# Patient Record
Sex: Female | Born: 1970
Health system: Southern US, Community
[De-identification: ages and names within clinical notes are randomized; demographics above are authoritative.]

## PROBLEM LIST (undated history)

## (undated) DIAGNOSIS — M1990 Unspecified osteoarthritis, unspecified site: Secondary | ICD-10-CM

## (undated) DIAGNOSIS — G35 Multiple sclerosis: Secondary | ICD-10-CM

## (undated) HISTORY — PX: NO PAST SURGERIES: SHX2092

---

## 2001-10-09 ENCOUNTER — Encounter: Admission: RE | Admit: 2001-10-09 | Discharge: 2001-10-09 | Payer: Self-pay | Admitting: Family Medicine

## 2001-10-09 ENCOUNTER — Encounter: Payer: Self-pay | Admitting: Family Medicine

## 2003-05-12 ENCOUNTER — Emergency Department (HOSPITAL_COMMUNITY): Admission: EM | Admit: 2003-05-12 | Discharge: 2003-05-12 | Payer: Self-pay | Admitting: Emergency Medicine

## 2004-05-23 ENCOUNTER — Encounter (HOSPITAL_COMMUNITY): Admission: RE | Admit: 2004-05-23 | Discharge: 2004-05-25 | Payer: Self-pay | Admitting: Neurology

## 2004-05-30 ENCOUNTER — Emergency Department (HOSPITAL_COMMUNITY): Admission: EM | Admit: 2004-05-30 | Discharge: 2004-05-30 | Payer: Self-pay | Admitting: Emergency Medicine

## 2006-06-11 ENCOUNTER — Ambulatory Visit: Payer: Self-pay

## 2006-11-14 ENCOUNTER — Encounter: Admission: RE | Admit: 2006-11-14 | Discharge: 2007-02-12 | Payer: Self-pay | Admitting: Neurology

## 2009-01-08 ENCOUNTER — Encounter: Admission: RE | Admit: 2009-01-08 | Discharge: 2009-01-08 | Payer: Self-pay | Admitting: Neurology

## 2010-04-01 ENCOUNTER — Encounter: Payer: Self-pay | Admitting: Internal Medicine

## 2010-07-27 NOTE — Op Note (Signed)
NAMESELAM, PIETSCH NO.:  [id number]   MEDICAL RECORD NO.:  [id number]            PATIENT TYPE:   LOCATION:                                 FACILITY:   PHYSICIAN:  Genene Churn. Love, M.D.         DATE OF BIRTH:   DATE OF PROCEDURE:  05/23/2004  DATE OF DISCHARGE:                                 OPERATIVE REPORT   DESCRIPTION OF PROCEDURE:  The patient was prepped and draped in the left  lateral decubitus position using Betadine and 1% Xylocaine.  The L4-5  interspace was entered without difficulty.  The opening pressure was 120  mmH2O and 2.5 mL of clear-colored CSF were obtained.  In putting on the  tubing, the CSF turned bloody and the procedure was discontinued.  The 2.5  mL of spinal fluid were sent for cell count, protein, IgG and oligoclonal  IgG.  The patient tolerated the procedure well.                                        ___________________________________________  Genene Churn. Sandria Manly, M.D.    JML/MEDQ  D:  05/23/2004  T:  05/23/2004  Job:  [id number]

## 2010-09-06 ENCOUNTER — Emergency Department (HOSPITAL_BASED_OUTPATIENT_CLINIC_OR_DEPARTMENT_OTHER)
Admission: EM | Admit: 2010-09-06 | Discharge: 2010-09-06 | Disposition: A | Discharge status: Home / Self Care | Payer: Medicare PPO | Source: Home / Self Care | Attending: Emergency Medicine | Admitting: Emergency Medicine

## 2010-09-06 ENCOUNTER — Emergency Department (HOSPITAL_COMMUNITY): Payer: Medicare PPO

## 2010-09-06 ENCOUNTER — Emergency Department (INDEPENDENT_AMBULATORY_CARE_PROVIDER_SITE_OTHER): Payer: Medicare PPO

## 2010-09-06 ENCOUNTER — Observation Stay (HOSPITAL_COMMUNITY)
Admission: EM | Admit: 2010-09-06 | Discharge: 2010-09-11 | Disposition: A | Discharge status: Skilled Nursing Facility | Payer: Medicare PPO | Attending: Internal Medicine | Admitting: Internal Medicine

## 2010-09-06 DIAGNOSIS — Z79899 Other long term (current) drug therapy: Secondary | ICD-10-CM | POA: Insufficient documentation

## 2010-09-06 DIAGNOSIS — M5430 Sciatica, unspecified side: Secondary | ICD-10-CM | POA: Insufficient documentation

## 2010-09-06 DIAGNOSIS — W19XXXA Unspecified fall, initial encounter: Secondary | ICD-10-CM | POA: Insufficient documentation

## 2010-09-06 DIAGNOSIS — G35 Multiple sclerosis: Secondary | ICD-10-CM | POA: Insufficient documentation

## 2010-09-06 DIAGNOSIS — Y92009 Unspecified place in unspecified non-institutional (private) residence as the place of occurrence of the external cause: Secondary | ICD-10-CM | POA: Insufficient documentation

## 2010-09-06 DIAGNOSIS — S8253XA Displaced fracture of medial malleolus of unspecified tibia, initial encounter for closed fracture: Principal | ICD-10-CM | POA: Insufficient documentation

## 2010-09-06 DIAGNOSIS — Z01812 Encounter for preprocedural laboratory examination: Secondary | ICD-10-CM | POA: Insufficient documentation

## 2010-09-06 DIAGNOSIS — K5909 Other constipation: Secondary | ICD-10-CM | POA: Insufficient documentation

## 2010-09-06 DIAGNOSIS — E876 Hypokalemia: Secondary | ICD-10-CM | POA: Insufficient documentation

## 2010-09-06 DIAGNOSIS — W1809XA Striking against other object with subsequent fall, initial encounter: Secondary | ICD-10-CM

## 2010-09-06 DIAGNOSIS — M25579 Pain in unspecified ankle and joints of unspecified foot: Secondary | ICD-10-CM

## 2010-09-06 DIAGNOSIS — T4275XA Adverse effect of unspecified antiepileptic and sedative-hypnotic drugs, initial encounter: Secondary | ICD-10-CM | POA: Insufficient documentation

## 2010-09-06 DIAGNOSIS — E669 Obesity, unspecified: Secondary | ICD-10-CM | POA: Insufficient documentation

## 2010-09-06 LAB — POCT I-STAT, CHEM 8
Calcium, Ion: 1.09 mmol/L — ABNORMAL LOW (ref 1.12–1.32)
Chloride: 104 mEq/L (ref 96–112)
Creatinine, Ser: 0.7 mg/dL (ref 0.50–1.10)
HCT: 49 % — ABNORMAL HIGH (ref 36.0–46.0)
TCO2: 25 mmol/L (ref 0–100)

## 2010-09-06 LAB — CBC
HCT: 42.2 % (ref 36.0–46.0)
MCH: 31.6 pg (ref 26.0–34.0)
RBC: 4.69 MIL/uL (ref 3.87–5.11)

## 2010-09-06 LAB — DIFFERENTIAL
Basophils Absolute: 0 10*3/uL (ref 0.0–0.1)
Basophils Relative: 0 % (ref 0–1)
Lymphs Abs: 3.4 10*3/uL (ref 0.7–4.0)
Monocytes Absolute: 1 10*3/uL (ref 0.1–1.0)
Monocytes Relative: 9 % (ref 3–12)

## 2010-09-08 LAB — CBC
Hemoglobin: 14.3 g/dL (ref 12.0–15.0)
Platelets: 293 10*3/uL (ref 150–400)
RBC: 4.46 MIL/uL (ref 3.87–5.11)

## 2010-09-08 LAB — DIFFERENTIAL
Basophils Absolute: 0 10*3/uL (ref 0.0–0.1)
Basophils Relative: 0 % (ref 0–1)
Eosinophils Absolute: 0.1 10*3/uL (ref 0.0–0.7)
Eosinophils Relative: 1 % (ref 0–5)
Lymphs Abs: 1.8 10*3/uL (ref 0.7–4.0)
Monocytes Absolute: 0.5 10*3/uL (ref 0.1–1.0)
Monocytes Relative: 6 % (ref 3–12)
Neutro Abs: 5.6 10*3/uL (ref 1.7–7.7)

## 2010-09-08 LAB — COMPREHENSIVE METABOLIC PANEL
Alkaline Phosphatase: 92 U/L (ref 39–117)
BUN: 11 mg/dL (ref 6–23)
CO2: 27 mEq/L (ref 19–32)
Calcium: 8.9 mg/dL (ref 8.4–10.5)
Creatinine, Ser: 0.75 mg/dL (ref 0.50–1.10)
GFR calc Af Amer: >60 mL/min (ref >60)
Glucose, Bld: 112 mg/dL — ABNORMAL HIGH (ref 70–99)
Sodium: 138 mEq/L (ref 135–145)
Total Bilirubin: 0.3 mg/dL (ref 0.3–1.2)

## 2010-09-09 LAB — BASIC METABOLIC PANEL
BUN: 9 mg/dL (ref 6–23)
CO2: 28 mEq/L (ref 19–32)
Chloride: 102 mEq/L (ref 96–112)
Creatinine, Ser: 0.66 mg/dL (ref 0.50–1.10)
GFR calc non Af Amer: >60 mL/min (ref >60)
Glucose, Bld: 111 mg/dL — ABNORMAL HIGH (ref 70–99)
Sodium: 138 mEq/L (ref 135–145)

## 2010-09-09 LAB — CBC
Hemoglobin: 13.9 g/dL (ref 12.0–15.0)
MCH: 31.2 pg (ref 26.0–34.0)
Platelets: 278 10*3/uL (ref 150–400)
RDW: 13.2 % (ref 11.5–15.5)
WBC: 6.6 10*3/uL (ref 4.0–10.5)

## 2010-09-09 NOTE — Discharge Summary (Signed)
NAMEAUSTIN, [name] NO.:  [id number]  MEDICAL RECORD NO.:  [id number]  LOCATION:  5127                         FACILITY:  [facility]  PHYSICIAN:  Talmage Nap, MD  DATE OF BIRTH:  February 18, 1971  DATE OF ADMISSION:  09/06/2010 DATE OF DISCHARGE:                        DISCHARGE SUMMARY - REFERRING   DATE OF DISCHARGE:  Undetermined.  PRIMARY CARE PHYSICIAN:  Unassigned.  CONSULTANT:  Orthopedic surgeon, Dr. Magnus Ivan.  DISCHARGE DIAGNOSES: 1. Fall/fracture of the medial malleolus of the right ankle, status     post splint - nonsurgical management.  The patient to have rehab. 2. Multiple sclerosis. 3. Chronic constipation, secondary to chronic narcotic use. 4. Hypokalemia. 5. Obesity.  HISTORY OF PRESENT ILLNESS:  The patient is a 40 year old Caucasian female with history of multiple sclerosis who was admitted to the hospital on August 29, 2010 by Dr. Letha Cape with history of fall and subsequent inability to ambulate.  There was no premonitory symptoms prior to the onset of fall and no systemic symptoms.  Post fall, the patient was said to having extreme difficulty in ambulating and subsequently brought to the emergency room to be evaluated.  PREADMISSION MEDICATIONS:  Avonex 30 mcg IM weekly.  ALLERGIES:  No known allergies.  PAST SURGICAL HISTORY:  No known surgeries.  SOCIAL HISTORY:  Negative for alcohol or tobacco use.  Lives alone by herself.  FAMILY HISTORY:  York Spaniel to be noncontributory.  REVIEW OF SYSTEMS:  Essentially as documented in the initial history and physical.  PHYSICAL EXAMINATION:  At the time, the patient was seen by the admitting physician. VITAL SIGNS:  Temperature of 98.6, blood pressure 139/90, heart rate 89, respiratory rate 20, saturating 99% on room. HEENT:  Pupils were reactive to light and extraocular muscles are intact. NECK:  No jugular venous distention.  No carotid bruit.  No lymphadenopathy. CHEST:  Clear to  auscultation. HEART:  Sounds are one and two. ABDOMEN:  Soft, nontender.  Liver, spleen, and kidney are not palpable. Bowel sounds are positive. EXTREMITIES:  Showed right lower extremity wrapped in an ACE bandage. NEUROPSYCHIATRIC:  Unremarkable. SKIN:  Showed normal turgor.  LABORATORY DATA:  Initial complete blood count with differential showed WBC of 10.7, hemoglobin of 14.8, hematocrit of 42.2, MCV of 90.0 with a platelet count of 315, normal differentials.  Chem-8 stat showed sodium of 140, potassium of 3.7, chloride of 104, glucose is 99, BUN is 11, creatinine 0.70.  A repeat complete blood count with differential done on August 22, 2010 showed WBC of 7.9, hemoglobin of 14.3, hematocrit of 41.2, MCV of 92.4 with a platelet count of 293.  A comprehensive metabolic panel showed sodium of 138, potassium of 3.3, chloride of 100 with a bicarb of 27, glucose is 112, BUN is 11, creatinine 0.75.  A repeat basic metabolic panel done on September 09, 2010 showed sodium of 138, potassium of 3.5, chloride of 102 with a bicarb of 28, glucose is 111, BUN is 19, creatinine 0.66 and a complete blood count with no differential showed WBC of 6.6, hemoglobin of 13.9, hematocrit of 41.0, MCV of 91.9 with a platelet count of 279.  Imaging studies done on the patient include x-ray  of the right ankle which showed fracture of the medial malleolus of the right ankle with diffuse soft tissue swelling.  HOSPITAL COURSE:  The patient was admitted to general medical floor. She was saline locked, given Zofran for nausea and Ambien 5 mg p.o. at bedtime for insomnia.  Her pain control was done with Tylenol and Percocet.  The patient was however seen by me for the very first time in this admission on September 07, 2010 and during this encounter, the patient continued to complain about pain in the right ankle.  At this point, the patient was continued on pain management and orthopedic surgeon, Dr. Magnus Ivan was consulted,  evaluated the patient, and recommended the patient to have PT/OT, and nonweightbearing splint on the right lower extremity with WBAT on the left leg.  Since the patient was found to be hypokalemic, she was given KCl 20 mEq p.o. b.i.d.  She was followed by me on a daily basis.  The patient was however seen today on 09/09/2010, complained about constipation and some irritation along the gluteal region since the patient wears a diaper because of her urinary incontinence.  Examination of the patient however was essentially unremarkable.  At this point, the patient was given Dulcolax 10 mg p.o. b.i.d. for constipation and zinc oxide powder apply b.i.d. to the gluteal region.  So far the patient has remained medically stable.  Plan is for the patient to be discharged to rehab, possibly [facility] on activity as tolerated and cardiac prudent diet.  Medication to be taken at the rehab facility will include: 1. Dulcolax 100 mg p.o. b.i.d. p.r.n. 2. Hydrocodone/APAP 5/325 1-2 tablets p.o. q.4 h. p.r.n. 3. MiraLax 15 g daily p.r.n. 4. Zinc oxide powder apply b.i.d. to the gluteal region. 5. Avonex 30 mcg IM weekly on Fridays.     Talmage Nap, MD     CN/MEDQ  D:  09/09/2010  T:  09/09/2010  Job:  [id number]  Electronically Signed by Talmage Nap  on 09/09/2010 05:09:17 PM

## 2010-09-10 LAB — COMPREHENSIVE METABOLIC PANEL
ALT: 18 U/L (ref 0–35)
Alkaline Phosphatase: 81 U/L (ref 39–117)
BUN: 10 mg/dL (ref 6–23)
Chloride: 101 mEq/L (ref 96–112)
Creatinine, Ser: 0.53 mg/dL (ref 0.50–1.10)
GFR calc non Af Amer: >60 mL/min (ref >60)
Sodium: 137 mEq/L (ref 135–145)
Total Bilirubin: 0.3 mg/dL (ref 0.3–1.2)

## 2010-09-10 LAB — CBC
HCT: 40.7 % (ref 36.0–46.0)
MCH: 30.5 pg (ref 26.0–34.0)
Platelets: 304 10*3/uL (ref 150–400)
RBC: 4.46 MIL/uL (ref 3.87–5.11)
WBC: 8.7 10*3/uL (ref 4.0–10.5)

## 2010-09-10 LAB — MAGNESIUM: Magnesium: 2 mg/dL (ref 1.5–2.5)

## 2010-09-10 LAB — DIFFERENTIAL
Eosinophils Absolute: 0.3 10*3/uL (ref 0.0–0.7)
Lymphs Abs: 3.7 10*3/uL (ref 0.7–4.0)
Monocytes Absolute: 0.9 10*3/uL (ref 0.1–1.0)
Neutro Abs: 3.7 10*3/uL (ref 1.7–7.7)
Neutrophils Relative %: 43 % (ref 43–77)

## 2010-09-10 NOTE — Discharge Summary (Signed)
  [name], [name] NO.:  [id number]  MEDICAL RECORD NO.:  [id number]  LOCATION:  5127                         FACILITY:  [facility]  PHYSICIAN:  Talmage Nap, MD  DATE OF BIRTH:  Oct 19, 1970  DATE OF ADMISSION:  09/06/2010 DATE OF DISCHARGE:  09/11/2010                        DISCHARGE SUMMARY - REFERRING   ADDENDUM: Please for discharge diagnosis and medications, see my initial discharge summary dictated on September 09, 2010.  The patient was however seen by me today which is September 10, 2010.  Denied any specific complaint.  Examination was essentially unremarkable.  Her vital signs, blood pressure is 126/70, pulse 86, respiratory rate 20, temperature is 98.8.  Labs prior to discharge include comprehensive metabolic panel which shows sodium of 137, potassium of 3.7, chloride 101 with a bicarb of 25, glucose is 96, BUN is 10, creatinine is 0.56.  LFT normal.  Magnesium 2.0.  Complete blood count with differential showed WBC of 8.7, hemoglobin 20.6, hematocrit of 40.7, MCV of 91.2 with a platelet count of 304.  The patient is medically stable.  Plan is for the patient to be discharged to rehab.  Refer to my initial discharge summary dictated on September 09, 2010 for discharge instructions and medications     Talmage Nap, MD     CN/MEDQ  D:  09/10/2010  T:  09/10/2010  Job:  [id number]  Electronically Signed by Talmage Nap  on 09/10/2010 03:16:54 PM

## 2010-09-11 LAB — URINALYSIS, DIPSTICK ONLY
Glucose, UA: NEGATIVE mg/dL
Protein, ur: NEGATIVE mg/dL
Specific Gravity, Urine: 1.021 (ref 1.005–1.030)
pH: 5.5 (ref 5.0–8.0)

## 2011-01-09 NOTE — H&P (Signed)
  NAMESABEEN, PIECHOCKI NO.:  [id number]  MEDICAL RECORD NO.:  [id number]  LOCATION:  [facility]                         FACILITY:  [facility]  PHYSICIAN:  Letha Cape, MD         DATE OF BIRTH:  [birth year]  DATE OF ADMISSION:  09/06/2010 DATE OF DISCHARGE:                             HISTORY & PHYSICAL   HISTORY OF PRESENT ILLNESS:  A 40 year old female who presents with a chief complaint of an ankle injury.  The patient was walking in her home and tripped on the demarcation between carpet and linoleum.  She fell and landed on her right ankle.  She was evaluated in the emergency room earlier this afternoon and was found to have a fracture of the medial malleolus of the right ankle.  The patient was treated in the emergency room at that time and discharged home.  Unfortunately, the patient lives alone and felt like she was unable to care for herself or manage at home.  She reports stumbling and landing on her foot several times after returning home.  She then returned to the emergency room, requesting assistance with placement in a rehab facility until she was recovered.  PAST MEDICAL HISTORY:  Multiple sclerosis.  MEDICATIONS:  Avonex 30 mcg injection weekly.  ALLERGIES:  No known drug allergies.  SURGICAL HISTORY:  No surgeries.  SOCIAL HISTORY:  She does not smoke or drink.  She lives at home alone.  FAMILY HISTORY:  Noncontributory.  REVIEW OF SYSTEMS:  All systems were reviewed and were negative except which was mentioned in the HPI.  PHYSICAL EXAMINATION:  VITAL SIGNS:  Temperature 98.6, blood pressure 139/90, heart rate 89, respirations 20, 99% on room air. GENERAL:  She is disheveled female, in no acute distress. CARDIOVASCULAR:  Regular rate and rhythm.  No murmurs, rubs, or gallops. LUNGS:  Clear to auscultation bilaterally. ABDOMEN:  Soft, nontender, nondistended.  Positive bowel sounds. EXTREMITIES:  Right lower extremity is wrapped in an Ace bandage.   Left lower extremity, no cyanosis, clubbing, or edema.  LABS:  White count 10.7, hemoglobin 16.7, hematocrit 49, platelets 315. Sodium 140, potassium 3.7, chloride 104, glucose 99, BUN 11, creatinine 0.7.  RADIOLOGY:  X-ray of the right ankle shows fracture of the medial malleolus with diffuse soft tissue swelling.  X-ray of left ankle shows no acute bony abnormality.  ASSESSMENT:  A 40 year old female with multiple sclerosis who presents with right ankle fracture and unable to care for herself at home.  1. Ankle fracture.  We will treat with pain medications as needed. 2. Multiple sclerosis.  Continue home Avonex weekly. 3. Prophylaxis.  Place on Lovenox for deep vein thrombosis prophylaxis     while hospitalized. 4. We will have social work to evaluate the patient for assistance and     placement in the rehab facility until she is able to care for     herself at home.          ______________________________ Letha Cape, MD     RW/MEDQ  D:  09/06/2010  T:  09/06/2010  Job:  [id number]  Electronically Signed by Virginia Rochester M.D. on 01/09/2011 10:20:04 AM

## 2012-08-25 ENCOUNTER — Encounter (HOSPITAL_COMMUNITY): Payer: Self-pay | Admitting: *Deleted

## 2012-08-25 ENCOUNTER — Observation Stay (HOSPITAL_COMMUNITY)
Admission: EM | Admit: 2012-08-25 | Discharge: 2012-08-26 | Disposition: A | Discharge status: Home / Self Care | Payer: Medicare PPO | Attending: Internal Medicine | Admitting: Internal Medicine

## 2012-08-25 ENCOUNTER — Emergency Department (HOSPITAL_COMMUNITY): Payer: Medicare PPO

## 2012-08-25 DIAGNOSIS — R0789 Other chest pain: Principal | ICD-10-CM | POA: Insufficient documentation

## 2012-08-25 DIAGNOSIS — Z602 Problems related to living alone: Secondary | ICD-10-CM | POA: Insufficient documentation

## 2012-08-25 DIAGNOSIS — F410 Panic disorder [episodic paroxysmal anxiety] without agoraphobia: Secondary | ICD-10-CM

## 2012-08-25 DIAGNOSIS — R079 Chest pain, unspecified: Secondary | ICD-10-CM

## 2012-08-25 DIAGNOSIS — G35 Multiple sclerosis: Secondary | ICD-10-CM

## 2012-08-25 HISTORY — DX: Multiple sclerosis: G35

## 2012-08-25 HISTORY — DX: Unspecified osteoarthritis, unspecified site: M19.90

## 2012-08-25 LAB — CBC WITH DIFFERENTIAL/PLATELET
Basophils Relative: 0 % (ref 0–1)
Eosinophils Absolute: 0.3 10*3/uL (ref 0.0–0.7)
Hemoglobin: 14.5 g/dL (ref 12.0–15.0)
Lymphocytes Relative: 40 % (ref 12–46)
MCH: 33.2 pg (ref 26.0–34.0)
MCHC: 35.5 g/dL (ref 30.0–36.0)
MCV: 93.6 fL (ref 78.0–100.0)
Monocytes Absolute: 0.7 10*3/uL (ref 0.1–1.0)
Monocytes Relative: 7 % (ref 3–12)
Neutro Abs: 4.5 10*3/uL (ref 1.7–7.7)
Neutrophils Relative %: 50 % (ref 43–77)
Platelets: 304 10*3/uL (ref 150–400)
RDW: 12.6 % (ref 11.5–15.5)

## 2012-08-25 LAB — BASIC METABOLIC PANEL
Calcium: 8.7 mg/dL (ref 8.4–10.5)
Creatinine, Ser: 0.8 mg/dL (ref 0.50–1.10)
GFR calc Af Amer: >90 mL/min (ref >90)
GFR calc non Af Amer: 89 mL/min — ABNORMAL LOW (ref >90)
Glucose, Bld: 105 mg/dL — ABNORMAL HIGH (ref 70–99)
Potassium: 3.5 mEq/L (ref 3.5–5.1)
Sodium: 139 mEq/L (ref 135–145)

## 2012-08-25 LAB — TROPONIN I
Troponin I: <0.3 ng/mL (ref <0.30)
Troponin I: <0.3 ng/mL (ref <0.30)
Troponin I: <0.3 ng/mL (ref <0.30)

## 2012-08-25 MED ORDER — ONDANSETRON HCL 4 MG/2ML IJ SOLN
4.0000 mg | Freq: Once | INTRAMUSCULAR | Status: AC
  Administered 2012-08-25: 4 mg via INTRAVENOUS
  Filled 2012-08-25: qty 2

## 2012-08-25 MED ORDER — SODIUM CHLORIDE 0.9 % IV SOLN
INTRAVENOUS | Status: AC
  Administered 2012-08-25: 10:00:00 via INTRAVENOUS

## 2012-08-25 MED ORDER — ASPIRIN 81 MG PO CHEW
324.0000 mg | CHEWABLE_TABLET | Freq: Once | ORAL | Status: AC
  Administered 2012-08-25: 324 mg via ORAL
  Filled 2012-08-25: qty 4

## 2012-08-25 MED ORDER — SODIUM CHLORIDE 0.9 % IJ SOLN
3.0000 mL | Freq: Two times a day (BID) | INTRAMUSCULAR | Status: DC
  Administered 2012-08-25 – 2012-08-26 (×2): 3 mL via INTRAVENOUS

## 2012-08-25 MED ORDER — ASPIRIN EC 81 MG PO TBEC
81.0000 mg | DELAYED_RELEASE_TABLET | Freq: Every day | ORAL | Status: DC
  Administered 2012-08-26: 81 mg via ORAL
  Filled 2012-08-25: qty 1

## 2012-08-25 MED ORDER — MORPHINE SULFATE 4 MG/ML IJ SOLN
4.0000 mg | Freq: Once | INTRAMUSCULAR | Status: AC
  Administered 2012-08-25: 4 mg via INTRAVENOUS
  Filled 2012-08-25: qty 1

## 2012-08-25 MED ORDER — ENOXAPARIN SODIUM 40 MG/0.4ML ~~LOC~~ SOLN
40.0000 mg | SUBCUTANEOUS | Status: DC
  Administered 2012-08-25 – 2012-08-26 (×2): 40 mg via SUBCUTANEOUS
  Filled 2012-08-25 (×2): qty 0.4

## 2012-08-25 NOTE — Progress Notes (Signed)
Utilization review completed.  P.J. Appollonia Klee,RN,BSN Case Manager [phone]  

## 2012-08-25 NOTE — Evaluation (Signed)
Physical Therapy Evaluation Patient Details Name: [name] MRN: [id number] DOB: December 27, 1970 Today's Date: 08/25/2012 Time: 4098-1191 PT Time Calculation (min): 32 min  PT Assessment / Plan / Recommendation Clinical Impression  Pt admitted with chest pain. Pt currently with functional limitations due to the deficits listed below (see PT Problem List). Pt will benefit from skilled PT to increase their independence and safety with mobility to allow discharge safely home.     PT Assessment  Patient needs continued PT services    Follow Up Recommendations  No PT follow up;Supervision - Intermittent    Does the patient have the potential to tolerate intense rehabilitation      Barriers to Discharge None      Equipment Recommendations  None recommended by PT    Recommendations for Other Services     Frequency Min 3X/week    Precautions / Restrictions Precautions Precautions: Fall Restrictions Weight Bearing Restrictions: No   Pertinent Vitals/Pain denied      Mobility  Bed Mobility Bed Mobility: Supine to Sit;Sit to Supine Supine to Sit: 6: Modified independent (Device/Increase time);With rails;HOB elevated Sit to Supine: 4: Min assist;HOB flat;Other (comment) (min a for lower extremities) Transfers Transfers: Sit to Stand;Stand to Sit Sit to Stand: 5: Supervision;With upper extremity assist;From bed;From toilet Stand to Sit: To toilet;To bed;With upper extremity assist;4: Min assist Ambulation/Gait Ambulation/Gait Assistance: 4: Min guard Ambulation Distance (Feet): 30 Feet Assistive device: Rolling walker Gait Pattern: Left steppage Gait velocity: decreased Stairs: No Wheelchair Mobility Wheelchair Mobility: No    Exercises     PT Diagnosis: Difficulty walking;Generalized weakness  PT Problem List: Decreased strength;Decreased activity tolerance;Decreased balance;Decreased mobility;Decreased knowledge of use of DME;Decreased knowledge of precautions PT  Treatment Interventions: DME instruction;Gait training;Functional mobility training;Therapeutic activities;Therapeutic exercise;Balance training;Patient/family education   PT Goals Acute Rehab PT Goals PT Goal Formulation: With patient Time For Goal Achievement: 09/01/12 Potential to Achieve Goals: Good Pt will go Sit to Stand: with modified independence;with upper extremity assist PT Goal: Sit to Stand - Progress: Goal set today Pt will go Stand to Sit: with modified independence;with upper extremity assist PT Goal: Stand to Sit - Progress: Goal set today Pt will Ambulate: 51 - 150 feet;with modified independence;with rolling walker PT Goal: Ambulate - Progress: Goal set today  Visit Information  Last PT Received On: 08/25/12 Assistance Needed: +1 PT/OT Co-Evaluation/Treatment: Yes    Subjective Data  Subjective: I really wish I could walk outside Patient Stated Goal: to go home   Prior Functioning  Home Living Lives With: Alone Available Help at Discharge: Family;Available PRN/intermittently Type of Home: House Home Access: Ramped entrance Home Layout: Two level;Full bath on main level;Able to live on main level with bedroom/bathroom Home Adaptive Equipment: Walker - rolling;Wheelchair - manual;Bedside commode/3-in-1;Hospital bed Prior Function Level of Independence: Independent with assistive device(s) Able to Take Stairs?: No Driving: No Vocation: On disability Communication Communication: No difficulties Dominant Hand: Right    Cognition  Cognition Arousal/Alertness: Awake/alert Behavior During Therapy: WFL for tasks assessed/performed Overall Cognitive Status: Within Functional Limits for tasks assessed    Extremity/Trunk Assessment Right Lower Extremity Assessment RLE ROM/Strength/Tone: WFL for tasks assessed RLE Coordination: WFL - gross/fine motor Left Lower Extremity Assessment LLE ROM/Strength/Tone: Deficits LLE ROM/Strength/Tone Deficits: grossly 3-4/5  due to MS Trunk Assessment Trunk Assessment: Kyphotic   Balance Balance Balance Assessed: No  End of Session PT - End of Session Equipment Utilized During Treatment: Gait belt Activity Tolerance: Patient tolerated treatment well Patient left: in bed;with call  bell/phone within reach Nurse Communication: Mobility status  GP Functional Assessment Tool Used: clinical judgement Functional Limitation: Mobility: Walking and moving around Mobility: Walking and Moving Around Current Status [phone]): At least 1 percent but less than 20 percent impaired, limited or restricted Mobility: Walking and Moving Around Goal Status [phone]): 0 percent impaired, limited or restricted   Feltis, Camryn Lampson K 08/25/2012, 12:44 PM Nicki Reaper. Feltis, PT, DPT [phone]

## 2012-08-25 NOTE — ED Provider Notes (Signed)
History     CSN: [id number]  Arrival date & time 08/25/12  0636   None     Chief Complaint  Patient presents with  . Chest Pain    (Consider location/radiation/quality/duration/timing/severity/associated sxs/prior treatment) HPI  PCP- Dr. Andi Devon  [name] is a 42 y.o.female presenting to the ER with complaints of right sided chest pain that started acutely between 5-6 am this morning. She describes the pain as stabbing, waxing and waning. She took two Tums which did not help. She had dizziness and shaking associated with the onset. She denies worsening of the pain with movement or pressure. NO n/v/d/fevers/cough. She has Multiple Sclerosis which has left her with bilateral leg weakness, left worse than right, which she describes as unchanged. Denies weakness tto upper extremities, or change in her ability to talk. She has never seen a cardiologist, had chest pains, or had any cardiac work-up in the past. Pt still currently having pain.   Past Medical History  Diagnosis Date  . MS (multiple sclerosis)     History reviewed. No pertinent past surgical history.  No family history on file.  History  Substance Use Topics  . Smoking status: Former Games developer  . Smokeless tobacco: Not on file  . Alcohol Use: No    OB History   Grav Para Term Preterm Abortions TAB SAB Ect Mult Living                  Review of Systems  Cardiovascular: Positive for chest pain.  Neurological: Positive for dizziness.  All other systems reviewed and are negative.    Allergies  Review of patient's allergies indicates no known allergies.  Home Medications   Current Outpatient Rx  Name  Route  Sig  Dispense  Refill  . calcium carbonate (TUMS - DOSED IN MG ELEMENTAL CALCIUM) 500 MG chewable tablet   Oral   Chew 2 tablets by mouth 3 (three) times daily as needed for heartburn.         Marland Kitchen ibuprofen (ADVIL,MOTRIN) 200 MG tablet   Oral   Take 400-800 mg by mouth every 6 (six)  hours as needed for pain.         Marland Kitchen interferon beta-1a (AVONEX) 30 MCG/0.5ML injection   Intramuscular   Inject 30 mcg into the muscle every 7 (seven) days.           There were no vitals taken for this visit.  Physical Exam  Nursing note and vitals reviewed. Constitutional: She is oriented to person, place, and time. She appears well-developed and well-nourished. No distress.  HENT:  Head: Normocephalic and atraumatic.  Eyes: Pupils are equal, round, and reactive to light.  Neck: Normal range of motion. Neck supple.  Cardiovascular: Normal rate and regular rhythm.   Pulmonary/Chest: Effort normal. She exhibits no tenderness, no bony tenderness and no crepitus. Right breast exhibits no tenderness. Left breast exhibits no tenderness.  Abdominal: Soft.  Neurological: She is alert and oriented to person, place, and time.  Bilateral lower extremity weakness  Skin: Skin is warm and dry.    ED Course  Procedures (including critical care time)  Labs Reviewed  BASIC METABOLIC PANEL - Abnormal; Notable for the following:    Glucose, Bld 105 (*)    GFR calc non Af Amer 89 (*)    All other components within normal limits  CBC WITH DIFFERENTIAL  PRO B NATRIURETIC PEPTIDE  TROPONIN I   Dg Chest 2 View  08/25/2012   *  RADIOLOGY REPORT*  Clinical Data: Chest pain.  CHEST - 2 VIEW  Comparison: 05/12/2003.  Findings: The cardiac silhouette, mediastinal and hilar contours are within normal limits and stable.  The lungs are clear.  The bony thorax is intact.  IMPRESSION: No acute cardiopulmonary findings.   Original Report Authenticated By: Rudie Meyer, M.D.     1. Chest pain       MDM   Date: 08/25/2012  Rate: 66  Rhythm:  sinus rhythm  QRS Axis: normal  Intervals: normal  ST/T Wave abnormalities: borderline t-wave abnormalities  Conduction Disutrbances:none  Narrative Interpretation:   Old EKG Reviewed: none available.   Chest xray, EKG and Troponin unremarkable.  Pain  relieved significantly with IV Morphine, Zofran and aspirin.  Patient has never seen a cardiologist or had cardiac work -up or chest pain therefore will admit for cardiac r/o. Discussed with patient and her family members who are agreeable to plan.  Obs, triad, team [address] tele        Dorthula Matas, PA-C 08/25/12 [phone]

## 2012-08-25 NOTE — H&P (Signed)
[facility] History and Physical  JANNEY PRIEGO BJY:[id number] DOB: 03/16/1970 DOA: 08/25/2012  Referring physician: Emergency Department PCP: Alva Garnet., MD  Specialists:   Chief Complaint: Chest pain  HPI: [name] is a 42 y.o. female with a hx of MS who presents to the ED with a hx of intermittent diffuse chest pain at rest. Sx started while watching TV this AM. No relief with Maalox or tylenol. In the ED, EKG was found to be unremarkable. Neg cardiac enzymes x1 thus far. The patient reported no nausea, diaphoresis, or sob. However, she did note "shaking" involving both hands and feeling "dizzy."  Review of Systems: chest pain, dizziness, all other ROS reviewed and are negative  Past Medical History  Diagnosis Date  . MS (multiple sclerosis)    History reviewed. No pertinent past surgical history. Social History:  reports that she has quit smoking. She does not have any smokeless tobacco history on file. She reports that she does not drink alcohol or use illicit drugs. Lives alone at home  No Known Allergies  No family history on file. Mother: heart disease, diabetes, HTN                                        Father: heart disease, diabetes, HTN  Prior to Admission medications   Medication Sig Start Date End Date Taking? Authorizing Provider  calcium carbonate (TUMS - DOSED IN MG ELEMENTAL CALCIUM) 500 MG chewable tablet Chew 2 tablets by mouth 3 (three) times daily as needed for heartburn.   Yes Historical Provider, MD  ibuprofen (ADVIL,MOTRIN) 200 MG tablet Take 400-800 mg by mouth every 6 (six) hours as needed for pain.   Yes Historical Provider, MD  interferon beta-1a (AVONEX) 30 MCG/0.5ML injection Inject 30 mcg into the muscle every 7 (seven) days.   Yes Historical Provider, MD   Physical Exam: Filed Vitals:   08/25/12 0900 08/25/12 0915 08/25/12 0930 08/25/12 0945  BP: 114/69 116/66 125/86 114/66  Pulse: 70 65 72 66  Resp: 21 19 12 19   SpO2: 97%  98% 99% 97%     General:  Awake, in nad  Eyes: PERRL  ENT: membranes moist, fair dentition  Neck: trachea midline, neck supple  Cardiovascular: regular, s1, s2, tender on palpation over chest  Respiratory: normal resp effort, no wheezing or crackles  Abdomen: soft, nondistended  Skin: no abnormal skin lesions seen, female pattern facial hair  Musculoskeletal: perfused, no clubbing or cyanosis  Psychiatric: appears normal  Neurologic: cn2-12 grossly intact, strength and sensation intact.  Labs on Admission:  Basic Metabolic Panel:  Recent Labs Lab 08/25/12 0653  NA 139  K 3.5  CL 104  CO2 26  GLUCOSE 105*  BUN 13  CREATININE 0.80  CALCIUM 8.7   Liver Function Tests: No results found for this basename: AST, ALT, ALKPHOS, BILITOT, PROT, ALBUMIN,  in the last 168 hours No results found for this basename: LIPASE, AMYLASE,  in the last 168 hours No results found for this basename: AMMONIA,  in the last 168 hours CBC:  Recent Labs Lab 08/25/12 0653  WBC 9.0  NEUTROABS 4.5  HGB 14.5  HCT 40.9  MCV 93.6  PLT 304   Cardiac Enzymes:  Recent Labs Lab 08/25/12 0653  TROPONINI <0.30    BNP (last 3 results)  Recent Labs  08/25/12 0653  PROBNP 95.0   CBG: No  results found for this basename: GLUCAP,  in the last 168 hours  Radiological Exams on Admission: Dg Chest 2 View  08/25/2012   *RADIOLOGY REPORT*  Clinical Data: Chest pain.  CHEST - 2 VIEW  Comparison: 05/12/2003.  Findings: The cardiac silhouette, mediastinal and hilar contours are within normal limits and stable.  The lungs are clear.  The bony thorax is intact.  IMPRESSION: No acute cardiopulmonary findings.   Original Report Authenticated By: Rudie Meyer, M.D.    EKG: Independently reviewed. NSR  Assessment/Plan Active Problems:   * No active hospital problems. *   Chest pain: - Reproducible on exam - Suspect possible costochondritis. Cont NSAIDs as tolerated - However, considering  strong family hx of heart disease and prior tobacco history (quit smoking in 2010), consider admission to tele on obs for r/o by cardiac enzymes  MS: - Appears stable - Lives alone - Will consult PT/OT  DVT prophylaxis: - Lovenox  Code Status: Full (must indicate code status--if unknown or must be presumed, indicate so) Family Communication: Pt and mother in room (indicate person spoken with, if applicable, with phone number if by telephone) Disposition Plan: Pending (indicate anticipated LOS)  Time spent:  Axcel Horsch, Scheryl Marten [facility] Pager [phone]  If 7PM-7AM, please contact night-coverage [email or web address] Password [email or web address] 08/25/2012, 10:00 AM

## 2012-08-25 NOTE — ED Notes (Signed)
PT is here with right chest wall pain that is intermittent and described as stabbing.  Pt reports coughing and dizziness.  Pain increases with movement

## 2012-08-25 NOTE — ED Notes (Signed)
Admitting MD at the bedside.  

## 2012-08-26 DIAGNOSIS — R079 Chest pain, unspecified: Secondary | ICD-10-CM

## 2012-08-26 DIAGNOSIS — F410 Panic disorder [episodic paroxysmal anxiety] without agoraphobia: Secondary | ICD-10-CM

## 2012-08-26 DIAGNOSIS — G35 Multiple sclerosis: Secondary | ICD-10-CM

## 2012-08-26 LAB — CBC
MCH: 32 pg (ref 26.0–34.0)
MCV: 94.7 fL (ref 78.0–100.0)
Platelets: 292 10*3/uL (ref 150–400)
RDW: 12.9 % (ref 11.5–15.5)

## 2012-08-26 LAB — COMPREHENSIVE METABOLIC PANEL
ALT: 11 U/L (ref 0–35)
AST: 15 U/L (ref 0–37)
Albumin: 3 g/dL — ABNORMAL LOW (ref 3.5–5.2)
Alkaline Phosphatase: 94 U/L (ref 39–117)
Chloride: 105 mEq/L (ref 96–112)
Potassium: 3.6 mEq/L (ref 3.5–5.1)
Sodium: 136 mEq/L (ref 135–145)
Total Bilirubin: 0.2 mg/dL — ABNORMAL LOW (ref 0.3–1.2)

## 2012-08-26 MED ORDER — ASPIRIN 81 MG PO TBEC: 81.0000 mg | DELAYED_RELEASE_TABLET | Freq: Every day | ORAL | Status: DC

## 2012-08-26 MED ORDER — FAMOTIDINE 20 MG PO TABS: 20.0000 mg | ORAL_TABLET | Freq: Two times a day (BID) | ORAL | Status: DC

## 2012-08-26 NOTE — ED Provider Notes (Signed)
Medical screening examination/treatment/procedure(s) were performed by non-physician practitioner and as supervising physician I was immediately available for consultation/collaboration.   Laray Anger, DO 08/26/12 [phone]

## 2012-08-26 NOTE — Progress Notes (Signed)
Pt discharged to home per MD order. Pt received and reviewed all discharge instructions and medication information including follow-up appointments and prescriptions. Pt verbalized understanding. Pt alert and oriented at discharge with no complaints of pain. Pt escorted to private vehicle via wheelchair by guest services volunteer. Coates, Hyatt Capobianco Elizabeth  

## 2012-08-26 NOTE — Discharge Summary (Signed)
Physician Discharge Summary  Patient ID: [name] MRN: [id number] DOB/AGE: 44/31/1972 44 y.o.  Admit date: 08/25/2012 Discharge date: 08/26/2012  Primary Care Physician:  Alva Garnet., MD  Discharge Diagnoses:    Marland Kitchen Multiple sclerosis . Chest pain atypical, musculoskeletal  . Panic attack  Consults:  None   Recommendations for Outpatient Follow-up:  Patient may benefit from outpatient stress test for risk stratification if she has any chest pains with typical features  Allergies:  No Known Allergies   Discharge Medications:   Medication List    TAKE these medications       aspirin 81 MG EC tablet  Take 1 tablet (81 mg total) by mouth daily.     calcium carbonate 500 MG chewable tablet  Commonly known as:  TUMS - dosed in mg elemental calcium  Chew 2 tablets by mouth 3 (three) times daily as needed for heartburn.     famotidine 20 MG tablet  Commonly known as:  PEPCID  Take 1 tablet (20 mg total) by mouth 2 (two) times daily.     ibuprofen 200 MG tablet  Commonly known as:  ADVIL,MOTRIN  Take 400-800 mg by mouth every 6 (six) hours as needed for pain.     interferon beta-1a 30 MCG/0.5ML injection  Commonly known as:  AVONEX  Inject 30 mcg into the muscle every 7 (seven) days.         Brief H and P: For complete details please refer to admission H and P, but in brief [name] is a 42 y.o. female with a hx of MS who presented to the ED with a hx of intermittent diffuse chest pain at rest. Symptoms  started while watching TV. Patient had no relief with Maalox or tylenol. In the ED, EKG was found to be unremarkable. Neg cardiac enzymes . The patient reported no nausea, diaphoresis, or sob. However, she did note "shaking" involving both hands and feeling "dizzy." She was admitted for further workup   Hospital Course:     Chest pain atypical features, she had reproducible chest pain. Cardiac enzymes remained negative for acute ACS. Chest x-ray did  not show any acute cardiopulmonary pathology. EKG showed rate of 66 with no acute ST-T wave changes just above the ischemia. Patient had no repeat episodes of chest pain. Patient may benefit from outpatient stress test for risk stratification if she has any chest pains with typical features. She was also placed on Pepcid.    Multiple sclerosis: continue beta-interferon out-patient    Panic attack: Possible component in her atypical chest pain, resolved   Day of Discharge BP 122/83  Pulse 76  Temp(Src) 98.6 F (37 C) (Oral)  Resp 18  Ht 5\' 2"  (1.575 m)  Wt 98.431 kg (217 lb)  BMI 39.68 kg/m2  SpO2 95%  Physical Exam: General: Alert and awake oriented x3 not in any acute distress. CVS: S1-S2 clear no murmur rubs or gallops Chest: clear to auscultation bilaterally, no wheezing rales or rhonchi Abdomen: soft nontender, nondistended, normal bowel sounds, Extremities: no cyanosis, clubbing or edema noted bilaterally Neuro: Cranial nerves II-XII intact, no focal neurological deficits   The results of significant diagnostics from this hospitalization (including imaging, microbiology, ancillary and laboratory) are listed below for reference.    LAB RESULTS: Basic Metabolic Panel:  Recent Labs Lab 08/25/12 0653 08/26/12 0445  NA 139 136  K 3.5 3.6  CL 104 105  CO2 26 23  GLUCOSE 105* 101*  BUN 13 12  CREATININE 0.80 0.80  CALCIUM 8.7 8.3*   Liver Function Tests:  Recent Labs Lab 08/26/12 0445  AST 15  ALT 11  ALKPHOS 94  BILITOT 0.2*  PROT 6.7  ALBUMIN 3.0*   No results found for this basename: LIPASE, AMYLASE,  in the last 168 hours No results found for this basename: AMMONIA,  in the last 168 hours CBC:  Recent Labs Lab 08/25/12 0653 08/26/12 0445  WBC 9.0 8.4  NEUTROABS 4.5  --   HGB 14.5 13.3  HCT 40.9 39.4  MCV 93.6 94.7  PLT 304 292   Cardiac Enzymes:  Recent Labs Lab 08/25/12 1652 08/25/12 2250  TROPONINI <0.30 <0.30   BNP: No components  found with this basename: POCBNP,  CBG: No results found for this basename: GLUCAP,  in the last 168 hours  Significant Diagnostic Studies:  Dg Chest 2 View  08/25/2012   *RADIOLOGY REPORT*  Clinical Data: Chest pain.  CHEST - 2 VIEW  Comparison: 05/12/2003.  Findings: The cardiac silhouette, mediastinal and hilar contours are within normal limits and stable.  The lungs are clear.  The bony thorax is intact.  IMPRESSION: No acute cardiopulmonary findings.   Original Report Authenticated By: Rudie Meyer, M.D.    2D ECHO:   Disposition and Follow-up:     Discharge Orders   Future Orders Complete By Expires     Diet - low sodium heart healthy  As directed     Increase activity slowly  As directed         DISPOSITION: HOME DIET: Heart healthy diet ACTIVITY: As tolerated   DISCHARGE FOLLOW-UP Follow-up Information   Follow up with Alva Garnet., MD. Schedule an appointment as soon as possible for a visit in 10 days. (for hospital follow-up)    Contact information:   [address] [place] [address] [phone]       Time spent on Discharge: 33 MINS  Signed:   RAI,RIPUDEEP M.D. [facility] 08/26/2012, 10:44 AM Pager: [phone]

## 2013-03-17 ENCOUNTER — Emergency Department (HOSPITAL_COMMUNITY)
Admission: EM | Admit: 2013-03-17 | Discharge: 2013-03-17 | Disposition: A | Discharge status: Home / Self Care | Payer: Medicare PPO | Attending: Emergency Medicine | Admitting: Emergency Medicine

## 2013-03-17 ENCOUNTER — Emergency Department (HOSPITAL_COMMUNITY): Payer: Medicare PPO

## 2013-03-17 ENCOUNTER — Encounter (HOSPITAL_COMMUNITY): Payer: Self-pay | Admitting: Emergency Medicine

## 2013-03-17 DIAGNOSIS — S82892A Other fracture of left lower leg, initial encounter for closed fracture: Secondary | ICD-10-CM

## 2013-03-17 DIAGNOSIS — Z7982 Long term (current) use of aspirin: Secondary | ICD-10-CM | POA: Insufficient documentation

## 2013-03-17 DIAGNOSIS — Z87891 Personal history of nicotine dependence: Secondary | ICD-10-CM | POA: Insufficient documentation

## 2013-03-17 DIAGNOSIS — W010XXA Fall on same level from slipping, tripping and stumbling without subsequent striking against object, initial encounter: Secondary | ICD-10-CM | POA: Insufficient documentation

## 2013-03-17 DIAGNOSIS — X500XXA Overexertion from strenuous movement or load, initial encounter: Secondary | ICD-10-CM | POA: Insufficient documentation

## 2013-03-17 DIAGNOSIS — Y929 Unspecified place or not applicable: Secondary | ICD-10-CM | POA: Insufficient documentation

## 2013-03-17 DIAGNOSIS — G35 Multiple sclerosis: Secondary | ICD-10-CM | POA: Insufficient documentation

## 2013-03-17 DIAGNOSIS — M129 Arthropathy, unspecified: Secondary | ICD-10-CM | POA: Insufficient documentation

## 2013-03-17 DIAGNOSIS — Z79899 Other long term (current) drug therapy: Secondary | ICD-10-CM | POA: Insufficient documentation

## 2013-03-17 DIAGNOSIS — Y9301 Activity, walking, marching and hiking: Secondary | ICD-10-CM | POA: Insufficient documentation

## 2013-03-17 DIAGNOSIS — S8253XA Displaced fracture of medial malleolus of unspecified tibia, initial encounter for closed fracture: Secondary | ICD-10-CM | POA: Insufficient documentation

## 2013-03-17 MED ORDER — OXYCODONE-ACETAMINOPHEN 5-325 MG PO TABS
2.0000 | ORAL_TABLET | Freq: Once | ORAL | Status: AC
  Administered 2013-03-17: 2 via ORAL
  Filled 2013-03-17: qty 2

## 2013-03-17 MED ORDER — OXYCODONE-ACETAMINOPHEN 5-325 MG PO TABS: 1.0000 | ORAL_TABLET | Freq: Four times a day (QID) | ORAL | Status: DC | PRN

## 2013-03-17 NOTE — Progress Notes (Signed)
   CARE MANAGEMENT ED NOTE 03/17/2013  Patient:  [name], [name]   Account Number:  [id number]  Date Initiated:  03/17/2013  Documentation initiated by:  Radford Pax  Subjective/Objective Assessment:   Patient presents to Ed post fall with injury to left ankle     Subjective/Objective Assessment Detail:   Patient awke and alert.  Patient with history of MS.     Action/Plan:   Xray completed in the ED.  Pain medicine given.   Action/Plan Detail:   Anticipated DC Date:  03/17/2013     Status Recommendation to Physician:   Result of Recommendation:    Other ED Services  Consult Working Plan    DC Planning Services  CM consult  Other    Choice offered to / List presented to:  C-1 Patient     HH arranged  HH-1 RN  HH-10 DISEASE MANAGEMENT  HH-2 PT  HH-3 OT  HH-4 NURSE'S AIDE  HH-6 SOCIAL WORKER      HH agency  [facility]  [facility]  [facility]  [facility]    Status of service:  Completed, signed off  ED Comments:   ED Comments Detail:  [facility] consulted by EDP to see patient regarding possible home health services.  EDCM spoke to patient and patient's father at bedside.  Patient lives alone.  Patient reports she has a hospital bed, walker,wheelchair and bedside commode at home.  Patient reports she has had home health services in the past, but can't remember if it was [facility] or [place].  Explained to patient with home health services she may receive a visiting RN, PT, OT, aide and Child psychotherapist.  Patient's father reports that he will check in on the patient and help with meals.  Patient reports that she will leave door unlocked if her father is unable to come over.  EDCM will fax patient out to [facility], [place], [place] and [place].  EDCM also provided patient with private duty nursing list and explained it may be an out of pocket expense for her.  Patient verbalized understanding.  Patient confirms her pcp is Dr. Andi Devon.   EDCM discussed [facility] with the patient and patient is agreeable for the referral. Patient's father pulled [facility] aside privately and stated, "I think she needs psychiatric help.  She doesn't come out of the house, we've tried to take her out to eat and hours later she changes her mind.  I don't know if she is washing herself, she says she is but who knows.  I have paid someone to come and clean the house for her. She's never come out and said she was depressed.  She's f[age] years old, I can't make her do anything she doesn't want to do."  EDCM encouraged patient's father to help patient come to a decision to speak to her physician for possible depression.  EDCM placed order for home health  RN for disease management.  [facility] discussed patient with EDP and PA.  No further CM needs at this time.

## 2013-03-17 NOTE — ED Notes (Signed)
Case management talking to father

## 2013-03-17 NOTE — ED Provider Notes (Signed)
CSN: [id number]     Arrival date & time 03/17/13  1649 History  This chart was scribed for Alpha Gula, PA-C, working with Celene Kras, MD by Blanchard Kelch, ED Scribe. This patient was seen in room WLCON/WLCON and the patient's care was started at 5:31 PM.      Chief Complaint  Patient presents with  . Ankle Pain    The history is provided by the patient. No language interpreter was used.    HPI Comments: ANEISA BLANCHE is a 43 y.o. female with a history of MS, osteochondritis, drop foot (left) and arthritis, brought in by ambulance, who presents to the Emergency Department complaining of a left ankle injury onset just prior to arrival. She states she was walking with her walker when she states her legs became "uncooperative" and she fell backwards. As she fell she states she heard a "pop" in the left ankle. She was unable to stand after the fall. She denies loss of consciousness with the fall. She is complaining of constant pain to the area onset immediately after she fell. She describes the pain as throbbing and rates it as a 10/10 in severity. She has associated swelling to the area. She is also complaining of pain to her left hip but does not remember if she hit it as she fell. She denies that she takes any medications daily other than ibuprofen. She denies any allergies that she knows of.  Past Medical History  Diagnosis Date  . MS (multiple sclerosis)   . Arthritis    Past Surgical History  Procedure Laterality Date  . No past surgeries     No family history on file. History  Substance Use Topics  . Smoking status: Former Smoker    Quit date: 07/03/2008  . Smokeless tobacco: Current User     Comment: USES ELECTRONIC CIGARETTES  . Alcohol Use: No   OB History   Grav Para Term Preterm Abortions TAB SAB Ect Mult Living                 Review of Systems  Musculoskeletal: Positive for arthralgias (left ankle pain).  All other systems reviewed and are  negative.    Allergies  Review of patient's allergies indicates no known allergies.  Home Medications   Current Outpatient Rx  Name  Route  Sig  Dispense  Refill  . aspirin EC 81 MG EC tablet   Oral   Take 1 tablet (81 mg total) by mouth daily.   30 tablet   3   . calcium carbonate (TUMS - DOSED IN MG ELEMENTAL CALCIUM) 500 MG chewable tablet   Oral   Chew 2 tablets by mouth 3 (three) times daily as needed for heartburn.         . famotidine (PEPCID) 20 MG tablet   Oral   Take 1 tablet (20 mg total) by mouth 2 (two) times daily.   60 tablet   3   . ibuprofen (ADVIL,MOTRIN) 200 MG tablet   Oral   Take 400-800 mg by mouth every 6 (six) hours as needed for pain.         Marland Kitchen interferon beta-1a (AVONEX) 30 MCG/0.5ML injection   Intramuscular   Inject 30 mcg into the muscle every 7 (seven) days.         Marland Kitchen oxyCODONE-acetaminophen (PERCOCET) 5-325 MG per tablet   Oral   Take 1-2 tablets by mouth every 6 (six) hours as needed.   20 tablet  0    Triage Vitals:BP 152/79  Pulse 88  Temp(Src) 98.6 F (37 C)  Resp 18  SpO2 100%  LMP 02/22/2013  Physical Exam  Nursing note and vitals reviewed. Constitutional: She is oriented to person, place, and time. She appears well-developed and well-nourished. No distress.  HENT:  Head: Normocephalic and atraumatic.  Eyes: EOM are normal.  Neck: Neck supple. No tracheal deviation present.  Cardiovascular: Normal rate and regular rhythm.   Pulmonary/Chest: Effort normal and breath sounds normal. No respiratory distress. She has no wheezes. She has no rales.  Musculoskeletal: Normal range of motion.  Left lateral malleolus swelling. Tenderness to palpation of distal tibia and fibula. No bruising noted.   Neurological: She is alert and oriented to person, place, and time.  Skin: Skin is warm and dry.  Psychiatric: She has a normal mood and affect. Her behavior is normal.    ED Course  Procedures (including critical care  time)  DIAGNOSTIC STUDIES: Oxygen Saturation is 100% on room air, normal by my interpretation.    COORDINATION OF CARE: 5:37 PM -Updated patient about radiology results. Will order additional imaging (complete left hip, knee and lumbar spine x-rays) as well as percocet for the pain. Patient verbalizes understanding and agrees with treatment plan.   Labs Review Labs Reviewed - No data to display Imaging Review Dg Lumbar Spine Complete  03/17/2013   CLINICAL DATA:  Low back and left hip pain  EXAM: LUMBAR SPINE - COMPLETE 4+ VIEW  COMPARISON:  None  FINDINGS: Five non-rib-bearing lumbar vertebrae.  Facet degenerative changes at multiple levels of the lumbar spurring greatest at L4-L5.  Scattered mild disc space narrowing and endplate spur formation.  Vertebral body heights maintained without fracture or subluxation.  No bone destruction or spondylolysis.  SI joints symmetric.  IMPRESSION: Degenerative disc and facet disease changes of the lumbar spine.  No acute abnormalities.   Electronically Signed   By: Ulyses SouthwardMark  Boles M.D.   On: 03/17/2013 18:57   Dg Hip Complete Left  03/17/2013   CLINICAL DATA:  Low back and left hip pain  EXAM: LEFT HIP - COMPLETE 2+ VIEW  COMPARISON:  None  FINDINGS: Diffuse osseous demineralization.  Hip and SI joints symmetric and preserved.  Facet degenerative changes lower lumbar spine.  No acute fracture, dislocation or bone destruction.  IMPRESSION: No acute osseous abnormalities.   Electronically Signed   By: Ulyses SouthwardMark  Boles M.D.   On: 03/17/2013 18:56   Dg Knee 2 Views Left  03/17/2013   CLINICAL DATA:  Left ankle fractures, low back and left hip pain  EXAM: LEFT KNEE - 1-2 VIEW  COMPARISON:  Left tibial and fibular radiographs 03/17/2013  FINDINGS: Osseous mineralization.  Medial compartment joint space narrowing.  No acute fracture, dislocation or bone destruction.  No definite knee joint effusion.  IMPRESSION: Osseous demineralization and degenerative changes without acute  abnormality.   Electronically Signed   By: Ulyses SouthwardMark  Boles M.D.   On: 03/17/2013 19:01   Dg Tibia/fibula Left  03/17/2013   CLINICAL DATA:  Ankle pain, low back and left hip pain  EXAM: LEFT TIBIA AND FIBULA - 2 VIEW  COMPARISON:  Ankle radiographs 03/17/2013  FINDINGS: Diffuse osseous demineralization.  Medial compartment joint space narrowing left knee.  Oblique lateral malleolar fracture, nondisplaced.  Displaced medial malleolar fracture is better visualized on preceding ankle radiographs.  No additional tibial or fibular fracture identified.  IMPRESSION: Medial lateral malleolar fractures better evaluated on prior ankle radiographs.  Osseous  demineralization with degenerative changes left knee.  No definite additional left tibial or fibular abnormalities.   Electronically Signed   By: Ulyses Southward M.D.   On: 03/17/2013 19:00   Dg Ankle Complete Left  03/17/2013   CLINICAL DATA:  Pain post trauma  EXAM: LEFT ANKLE COMPLETE - 3+ VIEW  COMPARISON:  None.  FINDINGS: Frontal, oblique, and lateral views were obtained. There is an avulsion fracture along the medial malleolus, displaced. There is an obliquely oriented fracture of the distal fibula near the diaphysis -metaphysis junction. There is a small avulsion arising from the posterior tibia. There is a fracture of the posterior talus with the posterior fragment displaced posteriorly compared to the rest of the talus. There is ankle mortise disruption.  There are small calcaneal spurs.  IMPRESSION: Fracture of the medial malleolus and distal fibula as well a small avulsion arising from the posterior tibia. There is ankle mortise disruption. There is also a fracture of the posterior talus with the posterior fragment displaced mildly posteriorly.   Electronically Signed   By: Bretta Bang M.D.   On: 03/17/2013 17:24    EKG Interpretation   None       MDM   1. Ankle fracture, left, closed, initial encounter     Plain films show fracture of medial  malleolus and distal fibula.  Patient advised to call orthopedics tomorrow to set up follow up appointment for LEFT ankle fracture. Take medications as directed. Stay off injured foot, rest, ice, and elevate. Discussed labs, and exam findings with patient. Patient's father confirms that he will check on patient and help her while she recovers from her injury.   Pt has hospital bed, wheelchair, walker and bedside commode at home.   Meds given in ED:  Medications  oxyCODONE-acetaminophen (PERCOCET/ROXICET) 5-325 MG per tablet 2 tablet (2 tablets Oral Given 03/17/13 1752)    Discharge Medication List as of 03/17/2013  8:07 PM    START taking these medications   Details  oxyCODONE-acetaminophen (PERCOCET) 5-325 MG per tablet Take 1-2 tablets by mouth every 6 (six) hours as needed., Starting 03/17/2013, Until Discontinued, Print         I personally performed the services described in this documentation, which was scribed in my presence. The recorded information has been reviewed and is accurate.    Rudene Anda, PA-C 03/24/13 2234

## 2013-03-17 NOTE — Discharge Instructions (Signed)
Call orthopedics tomorrow to set up follow up appointment for LEFT ankle fracture. Take medications as directed. Stay off injured foot, rest, ice, and elevate.    Ankle Fracture A fracture is a break in the bone. A cast or splint is used to protect and keep your injured bone from moving.  HOME CARE INSTRUCTIONS   Use your crutches as directed.  To lessen the swelling, keep the injured leg elevated while sitting or lying down.  Apply ice to the injury for 15-20 minutes, 03-04 times per day while awake for 2 days. Put the ice in a plastic bag and place a thin towel between the bag of ice and your cast.  If you have a plaster or fiberglass cast:  Do not try to scratch the skin under the cast using sharp or pointed objects.  Check the skin around the cast every day. You may put lotion on any red or sore areas.  Keep your cast dry and clean.  If you have a plaster splint:  Wear the splint as directed.  You may loosen the elastic around the splint if your toes become numb, tingle, or turn cold or blue.  Do not put pressure on any part of your cast or splint; it may break. Rest your cast only on a pillow the first 24 hours until it is fully hardened.  Your cast or splint can be protected during bathing with a plastic bag. Do not lower the cast or splint into water.  Take medications as directed by your caregiver. Only take over-the-counter or prescription medicines for pain, discomfort, or fever as directed by your caregiver.  Do not drive a vehicle until your caregiver specifically tells you it is safe to do so.  If your caregiver has given you a follow-up appointment, it is very important to keep that appointment. Not keeping the appointment could result in a chronic or permanent injury, pain, and disability. If there is any problem keeping the appointment, you must call back to this facility for assistance. SEEK IMMEDIATE MEDICAL CARE IF:   Your cast gets damaged or breaks.  You  have continued severe pain or more swelling than you did before the cast was put on.  Your skin or toenails below the injury turn blue or gray, or feel cold or numb.  There is a bad smell or new stains and/or purulent (pus like) drainage coming from under the cast. If you do not have a window in your cast for observing the wound, a discharge or minor bleeding may show up as a stain on the outside of your cast. Report these findings to your caregiver. MAKE SURE YOU:   Understand these instructions.  Will watch your condition.  Will get help right away if you are not doing well or get worse. Document Released: 02/23/2000 Document Revised: 05/20/2011 Document Reviewed: 09/29/2007 [facility] Patient Information 2014 [place], [place].

## 2013-03-17 NOTE — ED Provider Notes (Signed)
Medical screening examination/treatment/procedure(s) were conducted as a shared visit with non-physician practitioner(s) and myself.  I personally evaluated the patient during the encounter.  I discussed the case with Dr Lequita Halt.  Recommends cam walker and outpatient follow up.    Family was concerned about her ability to care for herself at home.  Pt's injury would not require hospitalization.  Asked case management for assistance to help set up home health.  Pt has a wheelchair at home already.  Celene Kras, MD 03/17/13 [phone]

## 2013-03-17 NOTE — ED Notes (Signed)
Pt in by EMS, pt reports using walker and tripping, fell backwards, felt pop in L ankle. Hx MS. No obvious deformity. Mild swelling to ankle. Denies other injuries or LOC r/t fall.

## 2013-03-18 NOTE — Progress Notes (Signed)
[facility] ED AM CM received a call from Staff from [facility] [phone] stating inability to provided home health services - not an in network provider for this [facility] plan

## 2013-03-18 NOTE — Progress Notes (Signed)
03/18/2013 A. Aksh Swart RNCM 1615pm EDCM spoke to [facility] Burna Mortimer  who has recieved referral for patient.  As per [facility] staff, patient does not have a pcp.  [facility] staff reports patient has not seen her pcp Dr. Renae Gloss since 2008, confirmed by patient and Dr. Mathews Robinsons office.  Yisroel Ramming is sending out home health doctor from [facility] to see patient.  As per Adelene Amas home health services cannot begin until patient is seen by physician and is unsure of when that will be

## 2013-03-18 NOTE — Progress Notes (Signed)
03/18/2013 A. Duanne Duchesne RNCM 1635pm EDCM atempted to follow up with patient without success.  EDCM spoke to [place], transition specialist for [facility] who has received referral and will contact patient tommorrow and possibly see tomorrow.

## 2013-03-19 NOTE — Progress Notes (Signed)
03/19/2013 A. Taniah Reinecke RNCM 1842pm [facility] called patient for follow up.  Patient reports, "I'm doing fine, feeling stronger.  Frances Furbish is setting me up with a pcp."  Informed patient that [facility] may be contacting patient for home health services.  Patient reports she has called to schedule an appointment with orthopedic doctor but has not received a phone call back.

## 2013-03-27 ENCOUNTER — Encounter (HOSPITAL_COMMUNITY): Payer: Self-pay | Admitting: *Deleted

## 2013-03-29 ENCOUNTER — Other Ambulatory Visit: Payer: Self-pay | Admitting: Orthopedic Surgery

## 2013-03-29 ENCOUNTER — Encounter (HOSPITAL_COMMUNITY): Payer: Self-pay | Admitting: Pharmacy Technician

## 2013-03-29 NOTE — H&P (Signed)
[name] is an 43 y.o. female.   Chief Complaint: Left ankle pain HPI: Pt presents to OR for surgical repair of left ankle bimalleolar fracture.  Pt denies N/V/F/C, chest pain, SOB, calf pain, or paresthesia bilaterally.  Past Medical History  Diagnosis Date  . MS (multiple sclerosis)   . Arthritis     Past Surgical History  Procedure Laterality Date  . No past surgeries      History reviewed. No pertinent family history. Social History:  reports that she quit smoking about 4 years ago. She uses smokeless tobacco. She reports that she does not drink alcohol or use illicit drugs.  Allergies: No Known Allergies  No prescriptions prior to admission    No results found for this or any previous visit (from the past 48 hour([name])). No results found.  Review of Systems  Constitutional: Negative.   HENT: Negative.   Eyes: Negative.   Respiratory: Negative.   Cardiovascular: Negative.   Gastrointestinal: Negative.   Musculoskeletal: Negative.   Skin: Negative.   Endo/Heme/Allergies: Negative.   Psychiatric/Behavioral: The patient is not nervous/anxious.     Last menstrual period 02/22/2013. Physical Exam  Obese WN 43 y/o female in NAD, A/Ox3, appears stated age.  EOMI, mood and affect normal, respirations unlabored. Left ankle 2+ edema with +TTP.  DP 2+ bilaterally.  Normal sensation to light touch intact.  Distal toes well perfused with cap refill <2sec.  Skin healthy and intact.  Assessment/Plan Left ankle bimalleolar fracture - to OR for ORIF.  The risks and benefits of the alternative treatment options have been discussed in detail.  The patient wishes to proceed with surgery and specifically understands risks of bleeding, infection, nerve damage, blood clots, need for additional surgery, amputation and death.  [name] 03/29/2013, 5:56 PM  I agree with the note above.  To OR for ORIF of left ankle bimal fracture.  The risks and benefits of the  alternative treatment options have been discussed in detail.  The patient wishes to proceed with surgery and specifically understands risks of bleeding, infection, nerve damage, blood clots, need for additional surgery, amputation and death.

## 2013-03-30 ENCOUNTER — Encounter (HOSPITAL_COMMUNITY): Payer: Medicare PPO | Admitting: Vascular Surgery

## 2013-03-30 ENCOUNTER — Inpatient Hospital Stay (HOSPITAL_COMMUNITY)
Admission: RE | Admit: 2013-03-30 | Discharge: 2013-04-02 | DRG: 493 | Disposition: A | Discharge status: Skilled Nursing Facility | Payer: Medicare PPO | Source: Ambulatory Visit | Attending: Orthopedic Surgery | Admitting: Orthopedic Surgery

## 2013-03-30 ENCOUNTER — Encounter (HOSPITAL_COMMUNITY)
Admission: RE | Disposition: A | Discharge status: Skilled Nursing Facility | Payer: Self-pay | Source: Ambulatory Visit | Attending: Orthopedic Surgery

## 2013-03-30 ENCOUNTER — Encounter (HOSPITAL_COMMUNITY): Payer: Self-pay | Admitting: Certified Registered"

## 2013-03-30 ENCOUNTER — Inpatient Hospital Stay (HOSPITAL_COMMUNITY): Payer: Medicare PPO | Admitting: Vascular Surgery

## 2013-03-30 ENCOUNTER — Inpatient Hospital Stay (HOSPITAL_COMMUNITY): Payer: Medicare PPO

## 2013-03-30 DIAGNOSIS — Z87891 Personal history of nicotine dependence: Secondary | ICD-10-CM

## 2013-03-30 DIAGNOSIS — W19XXXA Unspecified fall, initial encounter: Secondary | ICD-10-CM | POA: Diagnosis present

## 2013-03-30 DIAGNOSIS — M129 Arthropathy, unspecified: Secondary | ICD-10-CM | POA: Diagnosis present

## 2013-03-30 DIAGNOSIS — G35 Multiple sclerosis: Secondary | ICD-10-CM | POA: Diagnosis present

## 2013-03-30 DIAGNOSIS — S82843A Displaced bimalleolar fracture of unspecified lower leg, initial encounter for closed fracture: Principal | ICD-10-CM | POA: Diagnosis present

## 2013-03-30 DIAGNOSIS — E669 Obesity, unspecified: Secondary | ICD-10-CM | POA: Diagnosis present

## 2013-03-30 DIAGNOSIS — S82842A Displaced bimalleolar fracture of left lower leg, initial encounter for closed fracture: Secondary | ICD-10-CM

## 2013-03-30 DIAGNOSIS — Z6841 Body Mass Index (BMI) 40.0 and over, adult: Secondary | ICD-10-CM

## 2013-03-30 HISTORY — PX: ORIF ANKLE FRACTURE: SHX5408

## 2013-03-30 LAB — CBC
HCT: 42.1 % (ref 36.0–46.0)
Hemoglobin: 14.4 g/dL (ref 12.0–15.0)
MCH: 32.4 pg (ref 26.0–34.0)
MCHC: 34.2 g/dL (ref 30.0–36.0)
MCV: 94.8 fL (ref 78.0–100.0)
PLATELETS: 333 10*3/uL (ref 150–400)
RBC: 4.44 MIL/uL (ref 3.87–5.11)
RDW: 12.4 % (ref 11.5–15.5)
WBC: 9.8 10*3/uL (ref 4.0–10.5)

## 2013-03-30 LAB — BASIC METABOLIC PANEL
BUN: 10 mg/dL (ref 6–23)
CALCIUM: 8.8 mg/dL (ref 8.4–10.5)
CO2: 24 mEq/L (ref 19–32)
Chloride: 102 mEq/L (ref 96–112)
Creatinine, Ser: 0.73 mg/dL (ref 0.50–1.10)
GLUCOSE: 99 mg/dL (ref 70–99)
Potassium: 4 mEq/L (ref 3.7–5.3)
SODIUM: 139 meq/L (ref 137–147)

## 2013-03-30 LAB — HCG, SERUM, QUALITATIVE: PREG SERUM: NEGATIVE

## 2013-03-30 LAB — GLUCOSE, CAPILLARY: GLUCOSE-CAPILLARY: 111 mg/dL — AB (ref 70–99)

## 2013-03-30 SURGERY — OPEN REDUCTION INTERNAL FIXATION (ORIF) ANKLE FRACTURE
Anesthesia: Regional | Site: Ankle | Laterality: Left

## 2013-03-30 MED ORDER — METHOCARBAMOL 100 MG/ML IJ SOLN
500.0000 mg | Freq: Four times a day (QID) | INTRAVENOUS | Status: DC | PRN
  Filled 2013-03-30: qty 5

## 2013-03-30 MED ORDER — ENOXAPARIN SODIUM 40 MG/0.4ML ~~LOC~~ SOLN
40.0000 mg | SUBCUTANEOUS | Status: DC
  Administered 2013-03-30 – 2013-04-01 (×3): 40 mg via SUBCUTANEOUS
  Filled 2013-03-30 (×4): qty 0.4

## 2013-03-30 MED ORDER — HYDROMORPHONE HCL PF 1 MG/ML IJ SOLN
0.2500 mg | INTRAMUSCULAR | Status: DC | PRN
  Administered 2013-03-30 (×2): 0.5 mg via INTRAVENOUS

## 2013-03-30 MED ORDER — INFLUENZA VAC SPLIT QUAD 0.5 ML IM SUSP
0.5000 mL | INTRAMUSCULAR | Status: AC
  Administered 2013-03-31: 0.5 mL via INTRAMUSCULAR
  Filled 2013-03-30: qty 0.5

## 2013-03-30 MED ORDER — BACITRACIN ZINC 500 UNIT/GM EX OINT
TOPICAL_OINTMENT | CUTANEOUS | Status: AC
  Filled 2013-03-30: qty 15

## 2013-03-30 MED ORDER — SODIUM CHLORIDE 0.9 % IV SOLN
INTRAVENOUS | Status: DC
  Administered 2013-03-31: 02:00:00 via INTRAVENOUS

## 2013-03-30 MED ORDER — FENTANYL CITRATE 0.05 MG/ML IJ SOLN
INTRAMUSCULAR | Status: DC | PRN
  Administered 2013-03-30 (×2): 25 ug via INTRAVENOUS

## 2013-03-30 MED ORDER — OXYCODONE HCL 5 MG PO TABS
ORAL_TABLET | ORAL | Status: AC
  Filled 2013-03-30: qty 1

## 2013-03-30 MED ORDER — MIDAZOLAM HCL 5 MG/ML IJ SOLN: 2.0000 mg | Freq: Once | INTRAMUSCULAR | Status: DC

## 2013-03-30 MED ORDER — BUPIVACAINE HCL (PF) 0.25 % IJ SOLN
INTRAMUSCULAR | Status: AC
  Filled 2013-03-30: qty 30

## 2013-03-30 MED ORDER — ACETAMINOPHEN 160 MG/5ML PO SOLN
325.0000 mg | ORAL | Status: DC | PRN
  Filled 2013-03-30: qty 20.3

## 2013-03-30 MED ORDER — SODIUM CHLORIDE 0.9 % IV SOLN: INTRAVENOUS | Status: DC

## 2013-03-30 MED ORDER — LIDOCAINE HCL (CARDIAC) 20 MG/ML IV SOLN
INTRAVENOUS | Status: DC | PRN
  Administered 2013-03-30: 60 mg via INTRAVENOUS

## 2013-03-30 MED ORDER — OXYCODONE HCL 5 MG PO TABS
5.0000 mg | ORAL_TABLET | Freq: Once | ORAL | Status: AC | PRN
  Administered 2013-03-30: 5 mg via ORAL

## 2013-03-30 MED ORDER — CEFAZOLIN SODIUM-DEXTROSE 2-3 GM-% IV SOLR
2.0000 g | INTRAVENOUS | Status: AC
  Administered 2013-03-30: 2 g via INTRAVENOUS
  Filled 2013-03-30: qty 50

## 2013-03-30 MED ORDER — CHLORHEXIDINE GLUCONATE 4 % EX LIQD: 60.0000 mL | Freq: Once | CUTANEOUS | Status: DC

## 2013-03-30 MED ORDER — METHOCARBAMOL 500 MG PO TABS
ORAL_TABLET | ORAL | Status: AC
  Filled 2013-03-30: qty 1

## 2013-03-30 MED ORDER — LACTATED RINGERS IV SOLN
INTRAVENOUS | Status: DC
  Administered 2013-03-30 (×2): via INTRAVENOUS

## 2013-03-30 MED ORDER — INSULIN ASPART 100 UNIT/ML ~~LOC~~ SOLN: 0.0000 [IU] | Freq: Three times a day (TID) | SUBCUTANEOUS | Status: DC

## 2013-03-30 MED ORDER — ACETAMINOPHEN 500 MG PO TABS
1000.0000 mg | ORAL_TABLET | Freq: Once | ORAL | Status: AC
  Administered 2013-03-30: 1000 mg via ORAL
  Filled 2013-03-30: qty 2

## 2013-03-30 MED ORDER — HYDROMORPHONE HCL PF 1 MG/ML IJ SOLN
INTRAMUSCULAR | Status: AC
  Filled 2013-03-30: qty 1

## 2013-03-30 MED ORDER — FENTANYL CITRATE 0.05 MG/ML IJ SOLN
100.0000 ug | Freq: Once | INTRAMUSCULAR | Status: AC
  Administered 2013-03-30: 50 ug via INTRAVENOUS

## 2013-03-30 MED ORDER — ACETAMINOPHEN 500 MG PO TABS
ORAL_TABLET | ORAL | Status: AC
  Administered 2013-03-30: 11:00:00
  Filled 2013-03-30: qty 1

## 2013-03-30 MED ORDER — OXYCODONE HCL 5 MG PO TABS
5.0000 mg | ORAL_TABLET | ORAL | Status: DC | PRN
  Administered 2013-03-30 – 2013-04-01 (×9): 10 mg via ORAL
  Filled 2013-03-30 (×9): qty 2

## 2013-03-30 MED ORDER — MIDAZOLAM HCL 2 MG/2ML IJ SOLN
INTRAMUSCULAR | Status: AC
  Administered 2013-03-30: 1 mg
  Filled 2013-03-30: qty 2

## 2013-03-30 MED ORDER — ONDANSETRON HCL 4 MG/2ML IJ SOLN
INTRAMUSCULAR | Status: DC | PRN
  Administered 2013-03-30: 4 mg via INTRAVENOUS

## 2013-03-30 MED ORDER — HYDROMORPHONE HCL PF 1 MG/ML IJ SOLN
0.5000 mg | INTRAMUSCULAR | Status: DC | PRN
  Administered 2013-03-31 (×4): 1 mg via INTRAVENOUS
  Filled 2013-03-30 (×5): qty 1

## 2013-03-30 MED ORDER — ACETAMINOPHEN 325 MG PO TABS: 325.0000 mg | ORAL_TABLET | ORAL | Status: DC | PRN

## 2013-03-30 MED ORDER — ONDANSETRON HCL 4 MG/2ML IJ SOLN: 4.0000 mg | Freq: Once | INTRAMUSCULAR | Status: DC | PRN

## 2013-03-30 MED ORDER — PROPOFOL 10 MG/ML IV BOLUS
INTRAVENOUS | Status: DC | PRN
  Administered 2013-03-30: 200 mg via INTRAVENOUS

## 2013-03-30 MED ORDER — OXYCODONE HCL 5 MG/5ML PO SOLN: 5.0000 mg | Freq: Once | ORAL | Status: AC | PRN

## 2013-03-30 MED ORDER — DOCUSATE SODIUM 100 MG PO CAPS
100.0000 mg | ORAL_CAPSULE | Freq: Two times a day (BID) | ORAL | Status: DC
  Administered 2013-03-31 – 2013-04-02 (×5): 100 mg via ORAL
  Filled 2013-03-30 (×6): qty 1

## 2013-03-30 MED ORDER — SENNA 8.6 MG PO TABS
2.0000 | ORAL_TABLET | Freq: Two times a day (BID) | ORAL | Status: DC
  Administered 2013-03-30 – 2013-04-02 (×6): 17.2 mg via ORAL
  Filled 2013-03-30 (×7): qty 2

## 2013-03-30 MED ORDER — METHOCARBAMOL 500 MG PO TABS
500.0000 mg | ORAL_TABLET | Freq: Four times a day (QID) | ORAL | Status: DC | PRN
  Administered 2013-03-30 – 2013-04-02 (×9): 500 mg via ORAL
  Filled 2013-03-30 (×9): qty 1

## 2013-03-30 MED ORDER — ONDANSETRON HCL 4 MG PO TABS: 4.0000 mg | ORAL_TABLET | Freq: Four times a day (QID) | ORAL | Status: DC | PRN

## 2013-03-30 MED ORDER — FENTANYL CITRATE 0.05 MG/ML IJ SOLN
INTRAMUSCULAR | Status: AC
  Filled 2013-03-30: qty 2

## 2013-03-30 MED ORDER — ONDANSETRON HCL 4 MG/2ML IJ SOLN: 4.0000 mg | Freq: Four times a day (QID) | INTRAMUSCULAR | Status: DC | PRN

## 2013-03-30 SURGICAL SUPPLY — 70 items
BANDAGE ESMARK 6X9 LF (GAUZE/BANDAGES/DRESSINGS) ×1 IMPLANT
BIT DRILL 2.5X2.75 QC CALB (BIT) ×1 IMPLANT
BIT DRILL 3.5X5.5 QC CALB (BIT) ×1 IMPLANT
BLADE SURG 15 STRL LF DISP TIS (BLADE) ×1 IMPLANT
BLADE SURG 15 STRL SS (BLADE) ×2
BNDG CMPR 9X6 STRL LF SNTH (GAUZE/BANDAGES/DRESSINGS) ×1
BNDG COHESIVE 4X5 TAN STRL (GAUZE/BANDAGES/DRESSINGS) ×2 IMPLANT
BNDG COHESIVE 6X5 TAN NS LF (GAUZE/BANDAGES/DRESSINGS) ×2 IMPLANT
BNDG COHESIVE 6X5 TAN STRL LF (GAUZE/BANDAGES/DRESSINGS) ×2 IMPLANT
BNDG ESMARK 6X9 LF (GAUZE/BANDAGES/DRESSINGS) ×2
CHLORAPREP W/TINT 26ML (MISCELLANEOUS) ×4 IMPLANT
CLOTH BEACON ORANGE TIMEOUT ST (SAFETY) ×2 IMPLANT
COVER SURGICAL LIGHT HANDLE (MISCELLANEOUS) ×2 IMPLANT
CUFF TOURNIQUET SINGLE 34IN LL (TOURNIQUET CUFF) ×2 IMPLANT
CUFF TOURNIQUET SINGLE 44IN (TOURNIQUET CUFF) IMPLANT
DRAPE C-ARM 42X72 X-RAY (DRAPES) ×2 IMPLANT
DRAPE C-ARMOR (DRAPES) ×2 IMPLANT
DRAPE U-SHAPE 47X51 STRL (DRAPES) ×2 IMPLANT
DRSG ADAPTIC 3X8 NADH LF (GAUZE/BANDAGES/DRESSINGS) ×1 IMPLANT
DRSG PAD ABDOMINAL 8X10 ST (GAUZE/BANDAGES/DRESSINGS) ×4 IMPLANT
ELECT CAUTERY BLADE 6.4 (BLADE) ×2 IMPLANT
ELECT REM PT RETURN 9FT ADLT (ELECTROSURGICAL) ×2
ELECTRODE REM PT RTRN 9FT ADLT (ELECTROSURGICAL) ×1 IMPLANT
GLOVE BIO SURGEON STRL SZ7 (GLOVE) ×2 IMPLANT
GLOVE BIO SURGEON STRL SZ8 (GLOVE) ×2 IMPLANT
GLOVE BIOGEL PI IND STRL 7.5 (GLOVE) ×1 IMPLANT
GLOVE BIOGEL PI IND STRL 8 (GLOVE) ×1 IMPLANT
GLOVE BIOGEL PI INDICATOR 7.5 (GLOVE) ×1
GLOVE BIOGEL PI INDICATOR 8 (GLOVE) ×1
GOWN PREVENTION PLUS XLARGE (GOWN DISPOSABLE) ×2 IMPLANT
GOWN STRL NON-REIN LRG LVL3 (GOWN DISPOSABLE) ×4 IMPLANT
K-WIRE ACE 1.6X6 (WIRE) ×4
KIT BASIN OR (CUSTOM PROCEDURE TRAY) ×2 IMPLANT
KIT ROOM TURNOVER OR (KITS) ×2 IMPLANT
KWIRE ACE 1.6X6 (WIRE) IMPLANT
MANIFOLD NEPTUNE II (INSTRUMENTS) ×2 IMPLANT
NEEDLE 22X1 1/2 (OR ONLY) (NEEDLE) IMPLANT
NS IRRIG 1000ML POUR BTL (IV SOLUTION) ×2 IMPLANT
PACK ORTHO EXTREMITY (CUSTOM PROCEDURE TRAY) ×2 IMPLANT
PAD ARMBOARD 7.5X6 YLW CONV (MISCELLANEOUS) ×4 IMPLANT
PAD CAST 4YDX4 CTTN HI CHSV (CAST SUPPLIES) ×1 IMPLANT
PADDING CAST COTTON 4X4 STRL (CAST SUPPLIES) ×4
PLATE ACE 100DEG 7HOLE (Plate) ×1 IMPLANT
SCREW ACE CAN 4.0 44M (Screw) ×2 IMPLANT
SCREW CORTICAL 3.5MM  12MM (Screw) ×3 IMPLANT
SCREW CORTICAL 3.5MM  16MM (Screw) ×1 IMPLANT
SCREW CORTICAL 3.5MM  20MM (Screw) ×1 IMPLANT
SCREW CORTICAL 3.5MM 12MM (Screw) IMPLANT
SCREW CORTICAL 3.5MM 16MM (Screw) ×1 IMPLANT
SCREW CORTICAL 3.5MM 18MM (Screw) ×1 IMPLANT
SCREW CORTICAL 3.5MM 20MM (Screw) IMPLANT
SCREW CORTICAL 3.5MM 26MM (Screw) ×1 IMPLANT
SPLINT PLASTER CAST XFAST 5X30 (CAST SUPPLIES) IMPLANT
SPLINT PLASTER XFAST SET 5X30 (CAST SUPPLIES) ×1
SPONGE GAUZE 4X4 12PLY (GAUZE/BANDAGES/DRESSINGS) ×1 IMPLANT
SPONGE LAP 18X18 X RAY DECT (DISPOSABLE) ×2 IMPLANT
STAPLER VISISTAT 35W (STAPLE) IMPLANT
SUCTION FRAZIER TIP 10 FR DISP (SUCTIONS) ×2 IMPLANT
SUT ETHILON 3 0 PS 1 (SUTURE) ×2 IMPLANT
SUT MNCRL AB 3-0 PS2 18 (SUTURE) IMPLANT
SUT PROLENE 3 0 PS 2 (SUTURE) ×1 IMPLANT
SUT VIC AB 2-0 CT1 27 (SUTURE) ×4
SUT VIC AB 2-0 CT1 TAPERPNT 27 (SUTURE) ×2 IMPLANT
SUT VIC AB 3-0 PS2 18 (SUTURE)
SUT VIC AB 3-0 PS2 18XBRD (SUTURE) ×1 IMPLANT
SYR CONTROL 10ML LL (SYRINGE) IMPLANT
TOWEL OR 17X24 6PK STRL BLUE (TOWEL DISPOSABLE) ×2 IMPLANT
TOWEL OR 17X26 10 PK STRL BLUE (TOWEL DISPOSABLE) ×2 IMPLANT
TUBE CONNECTING 12X1/4 (SUCTIONS) ×2 IMPLANT
WATER STERILE IRR 1000ML POUR (IV SOLUTION) ×2 IMPLANT

## 2013-03-30 NOTE — Preoperative (Signed)
Beta Blockers   Reason not to administer Beta Blockers:Not Applicable 

## 2013-03-30 NOTE — Anesthesia Procedure Notes (Signed)
Procedure Name: LMA Insertion Date/Time: 03/30/2013 12:07 PM Performed by: Jerilee Hoh Pre-anesthesia Checklist: Patient identified, Emergency Drugs available, Suction available and Patient being monitored Patient Re-evaluated:Patient Re-evaluated prior to inductionOxygen Delivery Method: Circle system utilized Preoxygenation: Pre-oxygenation with 100% oxygen Intubation Type: IV induction LMA: LMA inserted LMA Size: 4.0 Tube type: Oral Number of attempts: 1 Placement Confirmation: positive ETCO2 and breath sounds checked- equal and bilateral Tube secured with: Tape Dental Injury: Teeth and Oropharynx as per pre-operative assessment

## 2013-03-30 NOTE — Anesthesia Preprocedure Evaluation (Signed)
Anesthesia Evaluation  Patient identified by MRN, date of birth, ID band Patient awake    Reviewed: Allergy & Precautions, H&P , NPO status , Patient's Chart, lab work & pertinent test results  History of Anesthesia Complications Negative for: history of anesthetic complications  Airway Mallampati: III TM Distance: >3 FB Neck ROM: Full    Dental  (+) Edentulous Upper and Edentulous Lower   Pulmonary neg shortness of breath, neg sleep apnea, neg COPDformer smoker,    Pulmonary exam normal       Cardiovascular Exercise Tolerance: Poor - angina- CHF Rhythm:Regular Rate:Normal     Neuro/Psych Anxiety Multiple sclerosis not actively treated, no symptoms or flares in years.     GI/Hepatic negative GI ROS, Neg liver ROS,   Endo/Other  negative endocrine ROS  Renal/GU negative Renal ROS  negative genitourinary   Musculoskeletal  (+) Arthritis -, Osteoarthritis,    Abdominal   Peds  Hematology   Anesthesia Other Findings   Reproductive/Obstetrics                           Anesthesia Physical Anesthesia Plan  ASA: II  Anesthesia Plan: General and Regional   Post-op Pain Management:    Induction: Intravenous  Airway Management Planned: LMA  Additional Equipment: None  Intra-op Plan:   Post-operative Plan: Extubation in OR  Informed Consent: I have reviewed the patients History and Physical, chart, labs and discussed the procedure including the risks, benefits and alternatives for the proposed anesthesia with the patient or authorized representative who has indicated his/her understanding and acceptance.   Dental advisory given  Plan Discussed with: CRNA and Surgeon  Anesthesia Plan Comments:         Anesthesia Quick Evaluation

## 2013-03-30 NOTE — Anesthesia Postprocedure Evaluation (Signed)
  Anesthesia Post-op Note  Patient: [name]  Procedure(s) Performed: Procedure(s): OPEN REDUCTION INTERNAL FIXATION (ORIF) LEFT ANKLE FRACTURE (Left)  Patient Location: PACU  Anesthesia Type:General and Regional  Level of Consciousness: awake, alert  and oriented  Airway and Oxygen Therapy: Patient Spontanous Breathing  Post-op Pain: none  Post-op Assessment: Post-op Vital signs reviewed, Patient's Cardiovascular Status Stable, Respiratory Function Stable, Patent Airway, No signs of Nausea or vomiting and Pain level controlled  Post-op Vital Signs: Reviewed and stable  Complications: No apparent anesthesia complications

## 2013-03-30 NOTE — Transfer of Care (Signed)
Immediate Anesthesia Transfer of Care Note  Patient: Towanda MalkinJeri L Loughmiller  Procedure(s) Performed: Procedure(s): OPEN REDUCTION INTERNAL FIXATION (ORIF) LEFT ANKLE FRACTURE (Left)  Patient Location: PACU  Anesthesia Type:GA combined with regional for post-op pain  Level of Consciousness: awake, alert , oriented and patient cooperative  Airway & Oxygen Therapy: Patient Spontanous Breathing and Patient connected to nasal cannula oxygen  Post-op Assessment: Report given to PACU RN, Post -op Vital signs reviewed and stable and Patient moving all extremities  Post vital signs: Reviewed and stable  Complications: No apparent anesthesia complications

## 2013-03-30 NOTE — Progress Notes (Signed)
Care of pt assumed by MA Alieyah Spader RN 

## 2013-03-30 NOTE — Brief Op Note (Signed)
03/30/2013  1:37 PM  PATIENT:  [name]  43 y.o. female  PRE-OPERATIVE DIAGNOSIS:   Left Ankle Bimalleolar Fracture  POST-OPERATIVE DIAGNOSIS:  Left Ankle trimalleolar Fracture  Procedure(s): ORIF left ankle trimalleolar fracture without fixation of the posterior lip  SURGEON:  Toni Arthurs, MD  ASSISTANT: n/a  ANESTHESIA:   General, regional  EBL:  100 cc   TOURNIQUET:   Total Tourniquet Time Documented: Thigh (Left) - 2 minutes Thigh (Left) - 2 minutes Total: Thigh (Left) - 4 minutes   COMPLICATIONS:  None apparent  DISPOSITION:  Extubated, awake and stable to recovery.  DICTATION ID:  [id number]

## 2013-03-31 NOTE — Clinical Social Work Placement (Addendum)
Clinical Social Work Department  CLINICAL SOCIAL WORK PLACEMENT NOTE   Patient: Towanda MalkinJeri L Sanderlin  Account Number: [id number]  Admit date: 03/30/12 Clinical Social Worker: Sabino NiemannAmy Beauty Pless LCSWA Date/time: 03/31/2013 11:30 AM  Clinical Social Work is seeking post-discharge placement for this patient at the following level of care: SKILLED NURSING (*CSW will update this form in Epic as items are completed)  03/31/2013 [facility]/family provided with [facility] of Clinical Social Work's list of facilities offering this level of care within the geographic area requested by the patient (or if unable, by the patient's family).  03/31/2013 Patient/family informed of their freedom to choose among providers that offer the needed level of care, that participate in Medicare, Medicaid or managed care program needed by the patient, have an available bed and are willing to accept the patient.  03/31/2013 Patient/family informed of [facility]' ownership interest in [facility], as well as of the fact that they are under no obligation to receive care at this facility.  PASARR submitted to EDS on 04/02/2013 PASARR number received from EDS on ,04/02/2013 FL2 transmitted to all facilities in geographic area requested by pt/family on 03/31/2013 FL2 transmitted to all facilities within larger geographic area on  Patient informed that his/her managed care company has contracts with or will negotiate with certain facilities, including the following:  Patient/family informed of bed offers received: 04/01/13 [facility] recommends and patient chooses bed at  Patient to be transferred to on 04/02/2013 [facility] The following physician request were entered in Epic:  Additional Comments:

## 2013-03-31 NOTE — Op Note (Signed)
NAMAdaline Sill:  Bulluck, Naiyah               ACCOUNT NO.:  [id number]  MEDICAL RECORD NO.:  [id number]  LOCATION:  5N05C                        FACILITY:  [facility]  PHYSICIAN:  Toni ArthursJohn Nayomi Tabron, MD        DATE OF BIRTH:  06/05/1970  DATE OF PROCEDURE:  03/30/2013 DATE OF DISCHARGE:                              OPERATIVE REPORT   PREOPERATIVE DIAGNOSIS:  Left ankle bimalleolar fracture.  POSTOPERATIVE DIAGNOSIS:  Left ankle trimalleolar fracture.  PROCEDURE:  Open reduction and internal fixation of left ankle trimalleolar fracture without fixation of the posterior lip.  SURGEON:  Toni ArthursJohn Iliya Spivack, MD  ANESTHESIA:  General, regional.  ESTIMATED BLOOD LOSS:  100 mL.  TOURNIQUET TIME:  4 minutes at 250 mmHg.  COMPLICATIONS:  None apparent.  DISPOSITION:  Extubated, awake, and stable to recovery.  INDICATIONS FOR PROCEDURE:  The patient is a 43 year old woman with past medical history significant for multiple sclerosis.  She fell about a week and a half ago, injuring her left ankle.  X-rays in the Emergency Department revealed a bimalleolar fracture.  She presents now for operative treatment of this displaced unstable injury.  She understands the risks and benefits, the alternative treatment options, and elects surgical treatment.  She specifically understands risks of bleeding, infection, nerve damage, blood clots, need for additional surgery, amputation, and death.  PROCEDURE IN DETAIL:  After preoperative consent was obtained and the correct operative site was identified, the patient was brought to the operating room and placed supine on the operating table.  General anesthesia was induced.  Preoperative antibiotics were administered. Surgical time-out was taken.  The left lower extremity was prepped and draped in standard sterile fashion with the tourniquet around the thigh. The extremity was exsanguinated.  The tourniquet was inflated to 250 mmHg.  A longitudinal incision was then made over  the lateral malleolus. The thigh tourniquet was clearly not effective, so it was released after approximately 4 minutes.  Subperiosteal dissection was then carried down to the fracture site.  Fracture was opened and cleaned of all hematoma. Fracture was reduced and held with a tenaculum.  A 3.5-mm fully-threaded lag screw was inserted from posterior to anterior across the fracture site.  A 7-hole 1/3rd tubular plate was then contoured to fit the lateral malleolus.  It was secured distally with two unicortical screws and proximally with three bicortical screws.  The two central holes in the plate were left open since they were over both the lag screw and the fracture site.  AP and lateral radiographs confirmed appropriate reduction of the fracture and appropriate position and length of the hardware.  Attention was then turned to the medial malleolus.  A longitudinal incision was made and sharp dissection was carried down through the skin and subcutaneous tissue.  The fracture site was identified.  It was cleaned of all periosteum and hematoma.  It was reduced and held with a tenaculum.  Two K-wires were inserted, provisionally holding the fracture in a reduced position.  AP and lateral radiographs confirmed appropriate position and reduction of the fracture.  Two partially threaded 4-mm cannulated screws were then inserted over the guide pins. They were both noted to have excellent purchase.  Final AP, mortise, and lateral radiographs confirmed appropriate reduction and position of all hardware.  Both wounds were then irrigated copiously.  The deep subcutaneous tissues were approximated with inverted simple sutures of 2- 0 Vicryl.  The skin was then closed with a running 3-0 nylon sutures. Sterile dressings were applied followed by well-padded short leg splint. The patient was then awakened from anesthesia and transported to the recovery room in stable condition.  FOLLOWUP PLAN:  The  patient will be admitted and evaluated by Physical Therapy and Occupational Therapy for likely placement in skilled nursing facility for acute rehab.  She will be started on Lovenox for DVT prophylaxis.     Toni Arthurs, MD     JH/MEDQ  D:  03/30/2013  T:  03/31/2013  Job:  [id number]

## 2013-03-31 NOTE — Evaluation (Signed)
Physical Therapy Evaluation Patient Details Name: Julia MalkinJeri L Auld MRN: [id number] DOB: 01/07/1971 Today's Date: 03/31/2013 Time: 1128-1203 PT Time Calculation (min): 35 min  PT Assessment / Plan / Recommendation History of Present Illness  Pt with L Bimalleolar fx; Now s/p ORIF; comorbidities include MS and obesity  Clinical Impression  Patient is s/p  Above surgery resulting in functional limitations due to the deficits listed below (see PT Problem List).  Patient will benefit from skilled PT to increase their independence and safety with mobility to allow discharge to the venue listed below.       PT Assessment  Patient needs continued PT services    Follow Up Recommendations  SNF    Does the patient have the potential to tolerate intense rehabilitation      Barriers to Discharge        Equipment Recommendations  Other (comment) (possibly sliding board)    Recommendations for Other Services OT consult   Frequency Min 3X/week    Precautions / Restrictions Precautions Precaution Comments: L LE/ankle Restrictions Weight Bearing Restrictions: Yes LLE Weight Bearing: Non weight bearing   Pertinent Vitals/Pain 7/10 LLE pain with mobility; elevated for edema and pain control       Mobility  Bed Mobility Overal bed mobility: Needs Assistance;+2 for physical assistance Bed Mobility: Supine to Sit Supine to sit: +2 for physical assistance;Mod assist General bed mobility comments: Able to pull to unsupported sitting with mod assist of 2; fatigues pretty quickly after sitting up Transfers Overall transfer level: Needs assistance Equipment used: None Transfers: Lateral/Scoot Transfers  Lateral/Scoot Transfers: Max assist;+2 physical assistance General transfer comment: lateral transfer from bed to drop arm recliner, used pads to assist    Exercises     PT Diagnosis: Generalized weakness;Acute pain  PT Problem List: Decreased strength;Decreased range of motion;Decreased  activity tolerance;Decreased balance;Decreased mobility;Decreased coordination;Decreased knowledge of use of DME;Pain;Obesity PT Treatment Interventions: DME instruction;Functional mobility training;Therapeutic activities;Therapeutic exercise;Balance training;Patient/family education;Wheelchair mobility training     PT Goals(Current goals can be found in the care plan section) Acute Rehab PT Goals Patient Stated Goal: to go to rehab then go home PT Goal Formulation: With patient Time For Goal Achievement: 04/14/13 Potential to Achieve Goals: Good Additional Goals Additional Goal #1: Pt will propell wheelchair with Bil UEs greater tahn 200 feet with supervision  Visit Information  Last PT Received On: 03/31/13 Assistance Needed: +2 PT/OT/SLP Co-Evaluation/Treatment: Yes Reason for Co-Treatment: Complexity of the patient's impairments (multi-system involvement);For patient/therapist safety PT goals addressed during session: Mobility/safety with mobility OT goals addressed during session: ADL's and self-care History of Present Illness: Pt with L Bimalleolar fx       Prior Functioning  Home Living Family/patient expects to be discharged to:: Private residence Living Arrangements: Alone Available Help at Discharge: Family;Available PRN/intermittently Type of Home: Other(Comment) (townhome) Home Access: Ramped entrance Home Layout: Two level;Full bath on main level;Able to live on main level with bedroom/bathroom Alternate Level Stairs-Number of Steps:  (has been on main level of home for 2 years) Home Equipment: Walker - 2 wheels;Bedside commode;Wheelchair - manual;Hospital bed;Cane - single point;Shower seat Adaptive Equipment: Reacher;Long-handled sponge;Sock aid Additional Comments: only knows where reacher is, cannot find other A/E at home Prior Function Level of Independence: Independent with assistive device(s) Communication Communication: No difficulties Dominant Hand: Right     Cognition  Cognition Arousal/Alertness: Awake/alert Behavior During Therapy: WFL for tasks assessed/performed Overall Cognitive Status: Within Functional Limits for tasks assessed    Extremity/Trunk Assessment Upper Extremity Assessment  Upper Extremity Assessment: Defer to OT evaluation Lower Extremity Assessment Lower Extremity Assessment: Generalized weakness;RLE deficits/detail RLE Deficits / Details: Ankle immobilized; positive active toe wiggle; Decr movement hip and knee limited by pain RLE: Unable to fully assess due to pain   Balance Balance Overall balance assessment: Needs assistance Sitting-balance support: Bilateral upper extremity supported Sitting balance-Leahy Scale: Fair Sitting balance - Comments: Fatigues quickly in unsupported sitting  End of Session PT - End of Session Equipment Utilized During Treatment:  (bed pad) Activity Tolerance: Patient limited by fatigue Patient left: in chair;with call bell/phone within reach Nurse Communication: Mobility status  GP     Van Clines [facility] [place], [place] [phone]  03/31/2013, 4:26 PM

## 2013-03-31 NOTE — Evaluation (Signed)
Occupational Therapy Evaluation Patient Details Name: ARRYN [name] MRN: [id number] DOB: [birth year] Today's Date: 03/31/2013 Time: 7829-5621 OT Time Calculation (min): 31 min  OT Assessment / Plan / Recommendation History of present illness Pt with L Bimalleolar fx   Clinical Impression   Pt demos decline in function and safety with ADLs and ADL mobility with decreased strength, balance and endurance and would benefit from acute OT services to address impairments to increase level of function and safety    OT Assessment  Patient needs continued OT Services    Follow Up Recommendations  SNF;Supervision/Assistance - 24 hour    Barriers to Discharge Decreased caregiver support pt lives at home alone, plans to d/c to SNF for short term rehab  Equipment Recommendations  None recommended by OT;Other (comment) (TBD at next level of care)    Recommendations for Other Services    Frequency  Min 2X/week    Precautions / Restrictions Precautions Precaution Comments: L LE/ankle Restrictions Weight Bearing Restrictions: Yes LLE Weight Bearing: Non weight bearing   Pertinent Vitals/Pain 7/10 L LE pain    ADL  Grooming: Performed;Wash/dry hands;Wash/dry face;Supervision/safety;Set up Where Assessed - Grooming: Supported sitting Upper Body Bathing: Simulated;Minimal assistance Where Assessed - Upper Body Bathing: Supported sitting Lower Body Bathing: +1 Total assistance Upper Body Dressing: Performed;Minimal assistance Where Assessed - Upper Body Dressing: Supported sitting Lower Body Dressing: +1 Total assistance Toilet Transfer: Simulated;+2 Total assistance;Other (comment) (later transfer) Toileting - Clothing Manipulation and Hygiene: +1 Total assistance Where Assessed - Toileting Clothing Manipulation and Hygiene: Rolling right and/or left Tub/Shower Transfer Method: Not assessed Transfers/Ambulation Related to ADLs: lateral transfer from bed to drop arm recliner, used pads to  assist    OT Diagnosis: Generalized weakness;Acute pain  OT Problem List: Decreased strength;Decreased activity tolerance;Pain;Impaired balance (sitting and/or standing) OT Treatment Interventions: Self-care/ADL training;Therapeutic exercise;Patient/family education;Neuromuscular education;Balance training;Therapeutic activities;DME and/or AE instruction   OT Goals(Current goals can be found in the care plan section) Acute Rehab OT Goals Patient Stated Goal: to go to rehab then go home OT Goal Formulation: With patient Time For Goal Achievement: 04/07/13 Potential to Achieve Goals: Good ADL Goals Pt Will Perform Grooming: with min guard assist;with supervision;with set-up;sitting (EOB) Pt Will Perform Upper Body Bathing: with min guard assist;with supervision;with set-up;sitting (EOB) Pt Will Perform Lower Body Bathing: with max assist;with mod assist;with adaptive equipment;sitting/lateral leans Pt Will Perform Upper Body Dressing: with min guard assist;with supervision;with set-up;sitting (EOB) Pt Will Transfer to Toilet: with total assist;with max assist;bedside commode Additional ADL Goal #1: Pt will complete bed mobility with max - mod A to sit EOB in prep for ADLs and BSC transfer  Visit Information  Last OT Received On: 03/31/13 Assistance Needed: +2 PT/OT/SLP Co-Evaluation/Treatment: Yes Reason for Co-Treatment: Complexity of the patient's impairments (multi-system involvement);For patient/therapist safety OT goals addressed during session: ADL's and self-care History of Present Illness: Pt with L Bimalleolar fx       Prior Functioning     Home Living Family/patient expects to be discharged to:: Private residence Living Arrangements: Alone Available Help at Discharge: Family;Available PRN/intermittently Type of Home: Other(Comment) (townhome) Home Layout: Two level;Full bath on main level;Able to live on main level with bedroom/bathroom Home Equipment: Adaptive  equipment Adaptive Equipment: Reacher;Long-handled sponge;Sock aid Additional Comments: only knows where reacher is, cannot find other A/E at home Prior Function Level of Independence: Independent with assistive device(s) Comments: uses RW to ambulate in the house, uses w/c sometimes Communication Communication: No difficulties Dominant Hand: Right  Vision/Perception Vision - History Baseline Vision: Wears glasses all the time Patient Visual Report: No change from baseline Perception Perception: Within Functional Limits   Cognition  Cognition Arousal/Alertness: Awake/alert Behavior During Therapy: WFL for tasks assessed/performed Overall Cognitive Status: Within Functional Limits for tasks assessed    Extremity/Trunk Assessment Upper Extremity Assessment Upper Extremity Assessment: Overall WFL for tasks assessed;Generalized weakness Lower Extremity Assessment Lower Extremity Assessment: Defer to PT evaluation     Mobility Bed Mobility Overal bed mobility: Needs Assistance;+2 for physical assistance Bed Mobility: Supine to Sit Supine to sit: Max assist;+2 for physical assistance General bed mobility comments: lateral transfer from bed to drop arm recliner, used pads to assist Transfers Overall transfer level: Needs assistance Transfers: Lateral/Scoot Transfers  Lateral/Scoot Transfers: Max assist;+2 physical assistance General transfer comment: lateral transfer from bed to drop arm recliner, used pads to assist          Balance Balance Overall balance assessment: Needs assistance Sitting-balance support: Bilateral upper extremity supported;Feet supported Sitting balance-Leahy Scale: Fair   End of Session OT - End of Session Activity Tolerance: Patient limited by fatigue;Patient limited by pain Patient left: in chair;with call bell/phone within reach Nurse Communication: Mobility status  GO     [name], [name] 03/31/2013, 3:12 PM

## 2013-03-31 NOTE — Progress Notes (Signed)
Subjective: 1 Day Post-Op Procedure(s) (LRB): OPEN REDUCTION INTERNAL FIXATION (ORIF) LEFT ANKLE FRACTURE (Left) Patient doing well this AM, sitting up in chair with left lower extremity elevated.  Pain well controlled 5/10 on 10 point scale.  Progressing well with physical and occupational therapy.  Pt able to stand and transfer to chair with assistance.  Pt denies N/V/F/C, chest pain, SOB, calf pain, or paresthesia bilterally.  Objective: Vital signs in last 24 hours: Temp:  [98.1 F (36.7 C)-98.6 F (37 C)] 98.6 F (37 C) (01/21 0428) Pulse Rate:  [73-100] 90 (01/21 0428) Resp:  [12-20] 18 (01/21 0428) BP: (134-166)/(64-99) 146/70 mmHg (01/21 0428) SpO2:  [98 %-100 %] 98 % (01/21 0428) Weight:  [106.595 kg (235 lb)] 106.595 kg (235 lb) (01/21 0649)  Intake/Output from previous day: 01/20 0701 - 01/21 0700 In: 2957.3 [P.O.:500; I.V.:2457.3] Out: 100 [Blood:100] Intake/Output this shift: Total I/O In: 360 [P.O.:360] Out: -    Recent Labs  03/30/13 1013  HGB 14.4    Recent Labs  03/30/13 1013  WBC 9.8  RBC 4.44  HCT 42.1  PLT 333    Recent Labs  03/30/13 1013  NA 139  K 4.0  CL 102  CO2 24  BUN 10  CREATININE 0.73  GLUCOSE 99  CALCIUM 8.8   No results found for this basename: LABPT, INR,  in the last 72 hours  NAD, A/Ox3, appears stated age.  EOMI, mood and affect normal, respirations unlabored.  Splint C/D/I, in proper placement and in good repair.  Toes mobile and well perfused with cap refill <2sec. Normal sensation to light touch intact.  Assessment/Plan: 1 Day Post-Op Procedure(s) (LRB): OPEN REDUCTION INTERNAL FIXATION (ORIF) LEFT ANKLE FRACTURE (Left) Continue medical management of MS Continue Lovenox for anticoagulation Continue physical/occupational therapy F/u with Dr. Victorino Dike in 2 weeks of being discharged from hospital  Tristan Proto S 03/31/2013, 12:26 PM

## 2013-03-31 NOTE — Progress Notes (Signed)
Utilization review completed.  

## 2013-04-01 ENCOUNTER — Encounter (HOSPITAL_COMMUNITY): Payer: Self-pay | Admitting: Orthopedic Surgery

## 2013-04-01 MED ORDER — OXYCODONE HCL 5 MG PO TABS
5.0000 mg | ORAL_TABLET | ORAL | Status: DC | PRN
  Administered 2013-04-01 – 2013-04-02 (×5): 10 mg via ORAL
  Filled 2013-04-01 (×5): qty 2

## 2013-04-01 MED ORDER — ACETAMINOPHEN 325 MG PO TABS: 650.0000 mg | ORAL_TABLET | Freq: Four times a day (QID) | ORAL | Status: DC | PRN

## 2013-04-01 NOTE — Progress Notes (Signed)
Subjective: Pt c/o pain when working with PT.  She agrees to participate after initially refusing.  Placement at [facility] pending.  No n/v.  TOlerating regular diet.   Objective: Vital signs in last 24 hours: Temp:  [98.3 F (36.8 C)-98.5 F (36.9 C)] 98.4 F (36.9 C) (01/22 1430) Pulse Rate:  [86-94] 94 (01/22 1430) Resp:  [16-20] 20 (01/22 1430) BP: (127-157)/(67-84) 157/84 mmHg (01/22 1430) SpO2:  [99 %-100 %] 99 % (01/22 1430)  Intake/Output from previous day: 01/21 0701 - 01/22 0700 In: 1463 [P.O.:1320; I.V.:143] Out: -  Intake/Output this shift: Total I/O In: 240 [P.O.:240] Out: -    Recent Labs  03/30/13 1013  HGB 14.4    Recent Labs  03/30/13 1013  WBC 9.8  RBC 4.44  HCT 42.1  PLT 333    Recent Labs  03/30/13 1013  NA 139  K 4.0  CL 102  CO2 24  BUN 10  CREATININE 0.73  GLUCOSE 99  CALCIUM 8.8   No results found for this basename: LABPT, INR,  in the last 72 hours  PE:  R LE splinted.  NVI at toes.  Assessment/Plan: R ankle bimal fx s/p ORIF.  - continue PT.  Hopefully d/c to SNF tomorrow upon approval by insurance carrier. Increase pain meds.  Continue lovenox for DVT prophylaxis.   Toni Arthurs 04/01/2013, 5:38 PM

## 2013-04-01 NOTE — Progress Notes (Signed)
Occupational Therapy Treatment Patient Details Name: Julia MalkinJeri L [name] MRN: [id number] DOB: 09/06/1970 Today's Date: 04/01/2013 Time: [id number] OT Time Calculation (min): 39 min  OT Assessment / Plan / Recommendation  History of present illness Pt with L Bimalleolar fx, morbid obesity and MS   OT comments  Pt making progress with functional goals. Pt nitially resistive to tx session today and became tearful and rude (cursing at OT to leave her alone). Educated pt on importance of getting OOB and what is expected at SNF level of care for rehab  Follow Up Recommendations  SNF;Supervision/Assistance - 24 hour    Barriers to Discharge   none, pt plans to d/c to a SNF for rehab    Equipment Recommendations   TBD   Recommendations for Other Services    Frequency Min 2X/week   Progress towards OT Goals Progress towards OT goals: Progressing toward goals  Plan      Precautions / Restrictions Precautions Precautions: Fall Precaution Comments: L LE/ankle Restrictions Weight Bearing Restrictions: Yes LLE Weight Bearing: Non weight bearing   Pertinent Vitals/Pain 8/10 before pain meds given to pt, 5/10 after pain meds working     ADL  Grooming: Performed;Wash/dry hands;Wash/dry face;Set up Where Assessed - Grooming: Unsupported sitting Upper Body Bathing: Simulated;Set up;Supervision/safety;Min guard Where Assessed - Upper Body Bathing: Unsupported sitting Lower Body Bathing: Simulated;Maximal assistance;Moderate assistance Where Assessed - Lower Body Bathing: Unsupported sitting Upper Body Dressing: Performed;Supervision/safety;Set up;Min guard Where Assessed - Upper Body Dressing: Unsupported sitting Toilet Transfer: Performed;Moderate assistance;Other (comment) (+2 PHYSICAL ASSIST) Transfers/Ambulation Related to ADLs: lateral scoot from bed to drop arm recliner with assist from therapists through bed pad to shift hips.  Pt  was giving max effort but due to her weakness, still needed  assist from therapists. Attempted stand from raised bed but pt. could not achieve standing on R LE due to weakness , even with 2 max assist    OT Diagnosis:    OT Problem List:   OT Treatment Interventions:     OT Goals(current goals can now be found in the care plan section) Acute Rehab OT Goals Patient Stated Goal: to go to rehab then go home  Visit Information  Last OT Received On: 04/01/13 Assistance Needed: +2 PT/OT/SLP Co-Evaluation/Treatment: Yes Reason for Co-Treatment: Complexity of the patient's impairments (multi-system involvement);For patient/therapist safety PT goals addressed during session: Mobility/safety with mobility OT goals addressed during session: ADL's and self-care History of Present Illness: Pt with L Bimalleolar fx, morbid obesity and MS    Subjective Data      Prior Functioning       Cognition  Cognition Arousal/Alertness: Awake/alert Behavior During Therapy: WFL for tasks assessed/performed;Agitated (pt resistive initially and became tearful and rude) Overall Cognitive Status: Within Functional Limits for tasks assessed    Mobility  Bed Mobility Overal bed mobility: Needs Assistance;+2 for physical assistance Bed Mobility: Supine to Sit Supine to sit: +2 for physical assistance;Mod assist General bed mobility comments: Still needs 2 mod assist to move from supine to sit at EOB but able to generate more effort to assist today.   Transfers Overall transfer level: Needs assistance Equipment used: None Transfers: Lateral/Scoot Transfers  Lateral/Scoot Transfers: Max assist;+2 physical assistance General transfer comment: lateral scoot from bed to drop arm recliner with assist from therapists through bed pad to shift hips.  Pt  was giving max effort but due to her weakness, still needed assist from therapists. Attempted stand from raised bed but pt. could not achieve standing  on R LE due to weakness , even with 2 max assist          Balance  Balance Overall balance assessment: Needs assistance Sitting-balance support: Single extremity supported;No upper extremity supported;Feet supported Sitting balance-Leahy Scale: Good Sitting balance - Comments: Better tolerance in sitting than yesterday but still not able to tolerate challenge  End of Session OT - End of Session Equipment Utilized During Treatment: Gait belt Activity Tolerance: Patient tolerated treatment well Patient left: in chair;with call bell/phone within reach Nurse Communication: Mobility status  GO     Galen Manila 04/01/2013, 2:48 PM

## 2013-04-01 NOTE — Progress Notes (Signed)
Physical Therapy Treatment Patient Details Name: [name] MRN: [id number] DOB: [birth year] Today's Date: 04/01/2013 Time: 1324-4010 PT Time Calculation (min): 43 min  PT Assessment / Plan / Recommendation  History of Present Illness Pt with L Bimalleolar fx, , morbid obesity and MS   PT Comments   Pt. Initially dramatic with OT and not willing to work with therapists due to difficulty she perceived yesterday.  Discussed with pt. That her max effort is needed and pt. Responded well to light kidding/jovial interactions.  RN able to provide additional pain med and pt. Agreeable to work.  She gave max effort in session but is limited by her baseline strength level due to MS.  She tolerated lateral transfer to drop arm recliner and assisted with her UEs as much as she could.  She is aware of NWB status L LE but needs external assist to assure compliance.  Expect she will do well in the SNF enviornment for her rehab.  Follow Up Recommendations  SNF     Does the patient have the potential to tolerate intense rehabilitation     Barriers to Discharge        Equipment Recommendations  Other (comment) (?sliding boaard)    Recommendations for Other Services    Frequency Min 3X/week   Progress towards PT Goals Progress towards PT goals: Progressing toward goals  Plan Current plan remains appropriate    Precautions / Restrictions Precautions Precautions: Fall Precaution Comments: L LE/ankle Restrictions Weight Bearing Restrictions: Yes LLE Weight Bearing: Non weight bearing   Pertinent Vitals/Pain See vitals tab Initially pain limiting session but additional med helped pt. Be able to participate more    Mobility  Bed Mobility Overal bed mobility: Needs Assistance;+2 for physical assistance Bed Mobility: Supine to Sit Supine to sit: +2 for physical assistance;Mod assist General bed mobility comments: Still needs 2 mod assist to move from supine to sit at EOB but able to generate more  effort to assist today.   Transfers Overall transfer level: Needs assistance Equipment used: None Transfers: Lateral/Scoot Transfers  Lateral/Scoot Transfers: Max assist;+2 physical assistance General transfer comment: lateral scoot from bed to drop arm recliner with assist from therapists through bed pad to shift hips.  Pt  was giving max effort but due to her weakness, still needed assist from therapists. Attempted stand from raised bed but pt. could not achieve standing on R LE due to weakness , even with 2 max assist Ambulation/Gait Ambulation/Gait assistance:  (not able)    Exercises     PT Diagnosis:    PT Problem List:   PT Treatment Interventions:     PT Goals (current goals can now be found in the care plan section)    Visit Information  Last PT Received On: 04/01/13 Assistance Needed: +2 PT/OT/SLP Co-Evaluation/Treatment: Yes Reason for Co-Treatment: Complexity of the patient's impairments (multi-system involvement);For patient/therapist safety PT goals addressed during session: Mobility/safety with mobility History of Present Illness: Pt with L Bimalleolar fx, , morbid obesity and MS    Subjective Data  Subjective: Pt. reports "I can tolerate a 5/10 pain level but not this 8/10"   Cognition  Cognition Arousal/Alertness: Awake/alert Behavior During Therapy: WFL for tasks assessed/performed Overall Cognitive Status: Within Functional Limits for tasks assessed    Balance  Balance Overall balance assessment: Needs assistance Sitting-balance support: Bilateral upper extremity supported;Feet supported Sitting balance-Leahy Scale: Fair Sitting balance - Comments: Better tolerance in sitting than yesterday but still not able to tolerate challenge  End of Session PT - End of Session Equipment Utilized During Treatment: Gait belt Activity Tolerance: Patient limited by fatigue Patient left: in chair;with call bell/phone within reach Nurse Communication: Mobility  status;Other (comment);Need for lift equipment (agreement with pt. to sit up < or = to 2 hours)   GP     Ferman HammingBlankenship, Norabelle Kondo B 04/01/2013, 2:19 PM Weldon PickingSusan Lafe Clerk PT Acute Rehab Services [phone] [place] [phone]

## 2013-04-02 MED ORDER — OXYCODONE HCL 5 MG PO TABS: 5.0000 mg | ORAL_TABLET | ORAL | Status: DC | PRN

## 2013-04-02 MED ORDER — FLEET ENEMA 7-19 GM/118ML RE ENEM
1.0000 | ENEMA | Freq: Once | RECTAL | Status: AC
  Administered 2013-04-02: 1 via RECTAL
  Filled 2013-04-02: qty 1

## 2013-04-02 NOTE — Discharge Summary (Signed)
Physician Discharge Summary  Patient ID: [name] MRN: [id number] DOB/AGE: Nov 14, 1970 43 y.o.  Admit date: 03/30/2013 Discharge date: 04/02/2013  Admission Diagnoses: Displaced Bimalleolar fracture of the left ankle  Discharge Diagnoses: same Active Problems:   Displaced bimalleolar fracture of left ankle   Discharged Condition: good  Hospital Course: on 03/29/2013 pt was brought to OR for surgical repair of left bimalleolar fracture.  Procedure performed by Dr. Victorino Dike with no complications and pt was recovered in PACU and transferred to the floor for further post operative care.  During pt's stay she underwent PT/OT for gait training and bed to chair transfer.  For anticoagulation pt was given Lovenox 40mg  QD.  On 04/02/2013 all VS were stable and pt was appropriate for discharge to SNF.  Prognosis for the pt is good and she will f/u with Dr. Victorino Dike in 2 weeks.  Consults: None  Significant Diagnostic Studies: none  Treatments: surgery: as stated above  Discharge Exam: Blood pressure 140/66, pulse 88, temperature 98.5 F (36.9 C), temperature source Oral, resp. rate 18, height 5\' 2"  (1.575 m), weight 106.595 kg (235 lb), last menstrual period 02/22/2013, SpO2 97.00%. Left lower extremity splinted, toes mobile, well perfused with normal sensation to light touch intact  Disposition: 01-Home or Self Care  Discharge Orders   Future Orders Complete By Expires   Call MD / Call 911  As directed    Comments:     If you experience chest pain or shortness of breath, CALL 911 and be transported to the hospital emergency room.  If you develope a fever above 101 F, pus (white drainage) or increased drainage or redness at the wound, or calf pain, call your surgeon's office.   Constipation Prevention  As directed    Comments:     Drink plenty of fluids.  Prune juice may be helpful.  You may use a stool softener, such as Colace (over the counter) 100 mg twice a day.  Use MiraLax (over the  counter) for constipation as needed.   Diet - low sodium heart healthy  As directed    Driving restrictions  As directed    Comments:     No driving for 6 weeks   Increase activity slowly as tolerated  As directed    Lifting restrictions  As directed    Comments:     No lifting for 6 weeks       Medication List         HYDROcodone-acetaminophen 5-325 MG per tablet  Commonly known as:  NORCO/VICODIN  Take 1 tablet by mouth every 6 (six) hours as needed (for pain).     ibuprofen 200 MG tablet  Commonly known as:  ADVIL,MOTRIN  Take 400 mg by mouth every 6 (six) hours as needed (for pain).     oxyCODONE 5 MG immediate release tablet  Commonly known as:  Oxy IR/ROXICODONE  Take 1-3 tablets (5-15 mg total) by mouth every 4 (four) hours as needed for moderate pain.           Follow-up Information   Follow up with Toni Arthurs, MD In 2 weeks.   Specialty:  Orthopedic Surgery   Contact information:   [address] [place] [address] [phone]       Signed: Hartford Poli 04/02/2013, 8:30 AM

## 2013-04-02 NOTE — Progress Notes (Signed)
Clinical social worker assisted with patient discharge to skilled nursing facility, [facility].  CSW addressed all family questions and concerns. CSW copied chart and added all important documents. CSW also set up patient transportation with [facility]. Clinical Social Worker will sign off for now as social work intervention is no longer needed.   Maedell Hedger, MSW, LCSWA [phone] 

## 2013-04-02 NOTE — Clinical Social Work Psychosocial (Signed)
Clinical Social Work Department  BRIEF PSYCHOSOCIAL ASSESSMENT  Patient: [name]  Account Number: [id number]  Admit date: 03/30/13 Clinical Social Worker Rhea Pink, MSW, LCSWA  Date/Time: 03/31/13  Referred by: Physician Date Referred: 03/31/13 Referred for   SNF Placement   Other Referral:  Interview type: Patient  Other interview type: PSYCHOSOCIAL DATA  Living Status:  Admitted from facility:  Level of care:  Primary support name:  Coralyn Mark Primary support relationship to patient: Mother Degree of support available:  Strong and vested  CURRENT CONCERNS  Current Concerns   Post-Acute Placement   Other Concerns:  SOCIAL WORK ASSESSMENT / PLAN  CSW met with pt re: PT recommendation for SNF.   Pt lives alone  CSW explained placement process and answered questions.   Pt reports [facility]  as her preference    CSW completed FL2 and initiated SNF search.     Assessment/plan status: Information/Referral to [employer]  Other assessment/ plan:  Information/referral to community resources:  SNF   PTAR  PATIENT'S/FAMILY'S RESPONSE TO PLAN OF CARE:  Pt  reports she is agreeable to [facility] SNF in order to increase strength and independence with mobility prior to returning home. Patient reports she is in a lot of pain and is anxious about SNF but knows that she needs to go.  Pt verbalized understanding of placement process and appreciation for CSW assist.   Rhea Pink, MSW, [place]

## 2013-04-02 NOTE — Discharge Instructions (Signed)
Julia Hewitt, MD °[facility] ° °Please read the following information regarding your care after surgery. ° °Medications  °You only need a prescription for the narcotic pain medicine (ex. oxycodone, Percocet, Norco).  All of the other medicines listed below are available over the counter. °X acetominophen (Tylenol) 650 mg every 4-6 hours as you need for minor pain °X oxycodone as prescribed for moderate to severe pain °?  ° °Narcotic pain medicine (ex. oxycodone, Percocet, Vicodin) will cause constipation.  To prevent this problem, take the following medicines while you are taking any pain medicine. °X docusate sodium (Colace) 100 mg twice a day X senna (Senokot) 2 tablets twice a day ° °X To help prevent blood clots, take an aspirin (325 mg) once a day for a month after surgery.  You should also get up every hour while you are awake to move around.   ° °Weight Bearing °X Do not bear any weight on the operated leg or foot. ° °Cast / Splint / Dressing °X Keep your splint or cast clean and dry.  Don’t put anything (coat hanger, pencil, etc) down inside of it.  If it gets damp, use a hair dryer on the cool setting to dry it.  If it gets soaked, call the office to schedule an appointment for a cast change. °  ° °After your dressing, cast or splint is removed; you may shower, but do not soak or scrub the wound.  Allow the water to run over it, and then gently pat it dry. ° °Swelling °It is normal for you to have swelling where you had surgery.  To reduce swelling and pain, keep your toes above your nose for at least 3 days after surgery.  It may be necessary to keep your foot or leg elevated for several weeks.  If it hurts, it should be elevated. ° °Follow Up °Call my office at [phone] when you are discharged from the hospital or surgery center to schedule an appointment to be seen two weeks after surgery. ° °Call my office at [phone] if you develop a fever >101.5° F, nausea, vomiting, bleeding from  the surgical site or severe pain.   ° ° °

## 2013-04-02 NOTE — Progress Notes (Signed)
Physical Therapy Treatment Patient Details Name: [name] MRN: [id number] DOB: [birth year] Today's Date: 04/02/2013 Time: 0930-1000 PT Time Calculation (min): 30 min  PT Assessment / Plan / Recommendation  History of Present Illness Pt with L Bimalleolar fx, morbid obesity and MS   PT Comments   Pt. With full willingness to participate in PT session but needs extra time for transitions.  She also needs increased time for task completion . She will need ongoing PT at SNF for gaining max independence in mobility before return home.  Again, she responds well to kidding and joking and appears somewhat childlike in hr behaviors.  Still needing 2 assist for transfers.    Follow Up Recommendations  SNF     Does the patient have the potential to tolerate intense rehabilitation     Barriers to Discharge        Equipment Recommendations   (? sliding board)    Recommendations for Other Services    Frequency Min 3X/week   Progress towards PT Goals Progress towards PT goals: Progressing toward goals  Plan Current plan remains appropriate    Precautions / Restrictions Precautions Precautions: Fall Precaution Comments: L LE/ankle Restrictions Weight Bearing Restrictions: Yes LLE Weight Bearing: Non weight bearing   Pertinent Vitals/Pain See vitals tab Pt's Rn in to provide pain med during session    Mobility  Bed Mobility Overal bed mobility: Needs Assistance;+2 for physical assistance Bed Mobility: Supine to Sit Supine to sit: +2 for physical assistance;Max assist General bed mobility comments: Needed @ max assist due to jsut having awoken .  Needs assist at lower body and shoulders to come to sit EOB, cues for technique Transfers Overall transfer level: Needs assistance Equipment used:  (sliding board) Transfers: Lateral/Scoot Transfers  Lateral/Scoot Transfers: +2 physical assistance;Max assist;With slide board;From elevated surface General transfer comment: tried use of  sliding board for transfers today.  Pt. needed 2 max assist and bed pad to scoot along sliding board to recliner.  It appeared that transfer was a little more difficult with use of sliding board but it could be due in part to pt. still  feeling fuzzy from being in a deep sleep.   Ambulation/Gait Ambulation/Gait assistance:  (N/A)    Exercises General Exercises - Lower Extremity Ankle Circles/Pumps: AROM;Right;10 reps   PT Diagnosis:    PT Problem List:   PT Treatment Interventions:     PT Goals (current goals can now be found in the care plan section)    Visit Information  Last PT Received On: 04/02/13 Assistance Needed: +2 History of Present Illness: Pt with L Bimalleolar fx, morbid obesity and MS    Subjective Data  Subjective: Pt. reports she just woke up from a deep sleep and feels fuzzy   Cognition  Cognition Arousal/Alertness: Awake/alert Behavior During Therapy: WFL for tasks assessed/performed;Agitated Overall Cognitive Status: Within Functional Limits for tasks assessed    Balance  Balance Overall balance assessment: Needs assistance Sitting-balance support: Feet supported;Single extremity supported Sitting balance-Leahy Scale: Good Sitting balance - Comments: Pt. improving with her sitting balance at edge of bed, still needs supervision for safety  End of Session PT - End of Session Equipment Utilized During Treatment: Gait belt Activity Tolerance: Patient limited by fatigue;Patient tolerated treatment well Patient left: in chair;with call bell/phone within reach Nurse Communication: Mobility status;Other (comment) (need for lift equipment back to bed; lift pad in room)   GP     [name] 04/02/2013, 10:13 AM [name]  PT Acute Rehab Services [phone] [place] [phone]

## 2013-04-05 ENCOUNTER — Other Ambulatory Visit: Payer: Self-pay | Admitting: *Deleted

## 2013-04-05 MED ORDER — OXYCODONE HCL 5 MG PO TABS: ORAL_TABLET | ORAL | Status: DC

## 2013-04-08 ENCOUNTER — Non-Acute Institutional Stay (SKILLED_NURSING_FACILITY): Payer: Medicare PPO | Admitting: Adult Health

## 2013-04-08 DIAGNOSIS — S82842A Displaced bimalleolar fracture of left lower leg, initial encounter for closed fracture: Secondary | ICD-10-CM

## 2013-04-08 DIAGNOSIS — S82843A Displaced bimalleolar fracture of unspecified lower leg, initial encounter for closed fracture: Secondary | ICD-10-CM

## 2013-04-08 DIAGNOSIS — G35D Multiple sclerosis, unspecified: Secondary | ICD-10-CM

## 2013-04-08 DIAGNOSIS — K5900 Constipation, unspecified: Secondary | ICD-10-CM

## 2013-04-08 DIAGNOSIS — G35 Multiple sclerosis: Secondary | ICD-10-CM

## 2013-04-09 ENCOUNTER — Encounter: Payer: Self-pay | Admitting: Adult Health

## 2013-04-09 DIAGNOSIS — K5900 Constipation, unspecified: Secondary | ICD-10-CM | POA: Insufficient documentation

## 2013-04-09 NOTE — Progress Notes (Signed)
Patient ID: Julia MalkinJeri L [name], female   DOB: 02/09/1971, 43 y.o.   MRN: [id number]               PROGRESS NOTE  DATE: 04/08/2013  FACILITY: Nursing Home [facility]  LEVEL OF CARE: SNF (31)  Acute Visit  CHIEF COMPLAINT:  Follow-up hospitalization  HISTORY OF PRESENT ILLNESS: This is a 43 year old female who [facility] with displaced bimalleolar fracture of the left ankle S/P ORIF. She has been admitted for a short-term rehabilitation.  REASSESSMENT OF ONGOING PROBLEM(S):  MULTIPLE SCLEROSIS : Stable; no complaints of visual change nor numbness/tingling sensation  CONSTIPATION: The constipation remains stable. She complains of difficulty swallowing capsule. Patient denies ongoing constipation, abdominal pain, nausea or vomiting.   PAST MEDICAL HISTORY : Reviewed.  No changes.  CURRENT MEDICATIONS: [facility]  REVIEW OF SYSTEMS:  GENERAL: no change in appetite, no fatigue, no weight changes, no fever, chills or weakness RESPIRATORY: no cough, SOB, DOE, wheezing, hemoptysis CARDIAC: no chest pain, or palpitations GI: no abdominal pain, diarrhea, constipation, heart burn, nausea or vomiting  PHYSICAL EXAMINATION  VS:  T97.6       P72      RR22      BP133/71     POX98 %     WT226.4 (Lb)  GENERAL: no acute distress, morbidly obese EYES: conjunctivae normal, sclerae normal, normal eye lids NECK: supple, trachea midline, no neck masses, no thyroid tenderness, no thyromegaly LYMPHATICS: no LAN in the neck, no supraclavicular LAN RESPIRATORY: breathing is even & unlabored, BS CTAB CARDIAC: RRR, no murmur,no extra heart sounds GI: abdomen soft, normal BS, no masses, no tenderness, no hepatomegaly, no splenomegaly EXTREMITIES: +short leg splint PSYCHIATRIC: the patient is alert & oriented to person, affect & behavior appropriate  LABS/RADIOLOGY: Labs reviewed: Basic Metabolic  Panel:  Recent Labs  08/25/12 0653 08/26/12 0445 03/30/13 1013  NA 139 136 139  K 3.5 3.6 4.0  CL 104 105 102  CO2 26 23 24   GLUCOSE 105* 101* 99  BUN 13 12 10   CREATININE 0.80 0.80 0.73  CALCIUM 8.7 8.3* 8.8   Liver Function Tests:  Recent Labs  08/26/12 0445  AST 15  ALT 11  ALKPHOS 94  BILITOT 0.2*  PROT 6.7  ALBUMIN 3.0*   CBC:  Recent Labs  08/25/12 0653 08/26/12 0445 03/30/13 1013  WBC 9.0 8.4 9.8  NEUTROABS 4.5  --   --   HGB 14.5 13.3 14.4  HCT 40.9 39.4 42.1  MCV 93.6 94.7 94.8  PLT 304 292 333   Cardiac Enzymes:  Recent Labs  08/25/12 0653 08/25/12 1652 08/25/12 2250  TROPONINI <0.30 <0.30 <0.30   CBG:  Recent Labs  03/30/13 2125  GLUCAP 111*     ASSESSMENT/PLAN:  Displaced bimalleolar fracture of the left ankle status post ORIF - for rehabilitation  Multiple sclerosis - stable  Constipation - discontinue Colace; start senna-S 2 tabs by mouth each bedtime    CPT CODE: [id number]

## 2013-04-09 NOTE — Progress Notes (Signed)
This encounter was created in error - please disregard.

## 2013-04-14 ENCOUNTER — Non-Acute Institutional Stay (SKILLED_NURSING_FACILITY): Payer: Medicare PPO | Admitting: Internal Medicine

## 2013-04-14 DIAGNOSIS — S82842A Displaced bimalleolar fracture of left lower leg, initial encounter for closed fracture: Secondary | ICD-10-CM

## 2013-04-14 DIAGNOSIS — Z79899 Other long term (current) drug therapy: Secondary | ICD-10-CM

## 2013-04-14 DIAGNOSIS — I10 Essential (primary) hypertension: Secondary | ICD-10-CM

## 2013-04-14 DIAGNOSIS — S82843A Displaced bimalleolar fracture of unspecified lower leg, initial encounter for closed fracture: Secondary | ICD-10-CM

## 2013-04-14 DIAGNOSIS — K5900 Constipation, unspecified: Secondary | ICD-10-CM

## 2013-04-25 DIAGNOSIS — I10 Essential (primary) hypertension: Secondary | ICD-10-CM | POA: Insufficient documentation

## 2013-04-25 DIAGNOSIS — Z79899 Other long term (current) drug therapy: Secondary | ICD-10-CM | POA: Insufficient documentation

## 2013-04-25 NOTE — Progress Notes (Signed)
HISTORY & PHYSICAL  DATE: 04/14/2013   FACILITY: [facility]  LEVEL OF CARE: SNF (31)  ALLERGIES:  No Known Allergies  CHIEF COMPLAINT:  Manage left ankle bimalleolar fx, constipation & HTN  HISTORY OF PRESENT ILLNESS: Pt is a 43 y/o Caucasian female:  ANKLE FRACTURE: The patient had a mechanical fall and sustained an ankle fracture. Patient underwent surgical repair and tolerated the procedure well. Patient is admitted to this facility for short-term rehabilitation. The patient denies ongoing pain. No complications reported from the current medication(s) being used.  CONSTIPATION: The constipation remains stable. No complications from the medications presently being used. Patient denies ongoing constipation, abdominal pain, nausea or vomiting.  HTN: new problem.  Currently not on meds.  Last BP 142/64, 140/66.  Pt denies HA, dizziness or visual disturbances.   PAST MEDICAL HISTORY :  Past Medical History  Diagnosis Date  . MS (multiple sclerosis)   . Arthritis     PAST SURGICAL HISTORY: Past Surgical History  Procedure Laterality Date  . No past surgeries    . Orif ankle fracture Left 03/30/2013    Procedure: OPEN REDUCTION INTERNAL FIXATION (ORIF) LEFT ANKLE FRACTURE;  Surgeon: Toni ArthursJohn Hewitt, MD;  Location: [facility] OR;  Service: Orthopedics;  Laterality: Left;    SOCIAL HISTORY:  reports that she quit smoking about 4 years ago. She uses smokeless tobacco. She reports that she does not drink alcohol or use illicit drugs.  FAMILY HISTORY: None  CURRENT MEDICATIONS: Reviewed per MAR  REVIEW OF SYSTEMS:  See HPI otherwise 14 point ROS is negative.  PHYSICAL EXAMINATION  VS:  T 98.2       P  72     RR 18      BP 142/64      POX% 94     GENERAL: no acute distress, moderately body habitus EYES: conjunctivae normal, sclerae normal, normal eye lids MOUTH/THROAT: lips without lesions,no lesions in the mouth,tongue is without lesions,uvula elevates in  midline NECK: supple, trachea midline, no neck masses, no thyroid tenderness, no thyromegaly LYMPHATICS: no LAN in the neck, no supraclavicular LAN RESPIRATORY: breathing is even & unlabored, BS CTAB CARDIAC: RRR, no murmur,no extra heart sounds, no edema GI:  ABDOMEN: abdomen soft, normal BS, no masses, no tenderness  LIVER/SPLEEN: no hepatomegaly, no splenomegaly MUSCULOSKELETAL: HEAD: normal to inspection & palpation BACK: no kyphosis, scoliosis or spinal processes tenderness EXTREMITIES: LEFT UPPER EXTREMITY: full range of motion, normal strength & tone RIGHT UPPER EXTREMITY:  full range of motion, normal strength & tone LEFT LOWER EXTREMITY:  In cast RIGHT LOWER EXTREMITY:  Moderate  range of motion, normal strength & tone PSYCHIATRIC: the patient is alert & oriented to person, affect & behavior appropriate  LABS/RADIOLOGY:  Labs reviewed: Basic Metabolic Panel:  Recent Labs  40/98/1106/17/14 0653 08/26/12 0445 03/30/13 1013  NA 139 136 139  K 3.5 3.6 4.0  CL 104 105 102  CO2 26 23 24   GLUCOSE 105* 101* 99  BUN 13 12 10   CREATININE 0.80 0.80 0.73  CALCIUM 8.7 8.3* 8.8   Liver Function Tests:  Recent Labs  08/26/12 0445  AST 15  ALT 11  ALKPHOS 94  BILITOT 0.2*  PROT 6.7  ALBUMIN 3.0*   CBC:  Recent Labs  08/25/12 0653 08/26/12 0445 03/30/13 1013  WBC 9.0 8.4 9.8  NEUTROABS 4.5  --   --   HGB 14.5 13.3 14.4  HCT 40.9 39.4 42.1  MCV 93.6 94.7 94.8  PLT 304 292 333   Cardiac Enzymes:  Recent Labs  08/25/12 0653 08/25/12 1652 08/25/12 2250  TROPONINI <0.30 <0.30 <0.30   CBG:  Recent Labs  03/30/13 2125  GLUCAP 111*   LEFT ANKLE COMPLETE - 3+ VIEW   COMPARISON:  Radiographs 03/17/2013.   FINDINGS: Three spot fluoroscopic images demonstrate screw and plate fixation of the distal fibular fracture with near anatomic alignment. There are 2 lag screws traversing the medial malleolar fracture. This fracture also demonstrates near anatomic  alignment. The ankle mortise is restored. On the lateral view, there is persistent cortical irregularity overlapping the anterior aspect of the tibial plafond.   IMPRESSION: Near anatomic reduction of medial malleolar and fibular fractures status post ORIF.     LEFT ANKLE COMPLETE - 3+ VIEW   COMPARISON: Radiographs 03/17/2013.   FINDINGS: Three spot fluoroscopic images demonstrate screw and plate fixation of the distal fibular fracture with near anatomic alignment. There are 2 lag screws traversing the medial malleolar fracture. This fracture also demonstrates near anatomic alignment. The ankle mortise is restored. On the lateral view, there is persistent cortical irregularity overlapping the anterior aspect of the tibial plafond.   IMPRESSION: Near anatomic reduction of medial malleolar and fibular fractures status post ORIF.   LEFT ANKLE COMPLETE - 3+ VIEW   COMPARISON:  Radiographs 03/17/2013.   FINDINGS: Three spot fluoroscopic images demonstrate screw and plate fixation of the distal fibular fracture with near anatomic alignment. There are 2 lag screws traversing the medial malleolar fracture. This fracture also demonstrates near anatomic alignment. The ankle mortise is restored. On the lateral view, there is persistent cortical irregularity overlapping the anterior aspect of the tibial plafond.   IMPRESSION: Near anatomic reduction of medial malleolar and fibular fractures status post ORIF.     LEFT ANKLE COMPLETE - 3+ VIEW   COMPARISON: Radiographs 03/17/2013.   FINDINGS: Three spot fluoroscopic images demonstrate screw and plate fixation of the distal fibular fracture with near anatomic alignment. There are 2 lag screws traversing the medial malleolar fracture. This fracture also demonstrates near anatomic alignment. The ankle mortise is restored. On the lateral view, there is persistent cortical irregularity overlapping the anterior aspect of the  tibial plafond.   IMPRESSION: Near anatomic reduction of medial malleolar and fibular fractures status post ORIF.  ASSESSMENT/PLAN:  Bimalleolar fx of left ankle-s/p surgical repair Constipatin-uncontrolled.  Increase miralax to bid. HTN-new problem.  Start HCTZ 25mg  qd. Bmp in 2 days.  I have reviewed pt's medical records from hospitalization.  CPT CODE: [id number]

## 2013-04-29 ENCOUNTER — Other Ambulatory Visit: Payer: Self-pay | Admitting: *Deleted

## 2013-04-29 MED ORDER — LORAZEPAM 0.5 MG PO TABS: ORAL_TABLET | ORAL | Status: DC

## 2013-04-29 NOTE — Telephone Encounter (Signed)
[facility] 

## 2013-05-03 ENCOUNTER — Encounter: Payer: Self-pay | Admitting: *Deleted

## 2013-05-13 ENCOUNTER — Non-Acute Institutional Stay (SKILLED_NURSING_FACILITY): Payer: Medicare PPO | Admitting: Adult Health

## 2013-05-13 ENCOUNTER — Encounter: Payer: Self-pay | Admitting: Adult Health

## 2013-05-13 ENCOUNTER — Other Ambulatory Visit: Payer: Self-pay | Admitting: *Deleted

## 2013-05-13 DIAGNOSIS — G35 Multiple sclerosis: Secondary | ICD-10-CM

## 2013-05-13 DIAGNOSIS — F411 Generalized anxiety disorder: Secondary | ICD-10-CM

## 2013-05-13 DIAGNOSIS — K5900 Constipation, unspecified: Secondary | ICD-10-CM

## 2013-05-13 DIAGNOSIS — F419 Anxiety disorder, unspecified: Secondary | ICD-10-CM | POA: Insufficient documentation

## 2013-05-13 DIAGNOSIS — S82842A Displaced bimalleolar fracture of left lower leg, initial encounter for closed fracture: Secondary | ICD-10-CM

## 2013-05-13 DIAGNOSIS — S82843A Displaced bimalleolar fracture of unspecified lower leg, initial encounter for closed fracture: Secondary | ICD-10-CM

## 2013-05-13 DIAGNOSIS — I10 Essential (primary) hypertension: Secondary | ICD-10-CM

## 2013-05-13 MED ORDER — LORAZEPAM 0.5 MG PO TABS: ORAL_TABLET | ORAL | Status: DC

## 2013-05-13 NOTE — Progress Notes (Signed)
Patient ID: [name], female   DOB: 09/24/1970, 43 y.o.   MRN: [id number]                 PROGRESS NOTE  DATE: 05/13/13  FACILITY: Nursing Home [facility]  LEVEL OF CARE: SNF (31)  Routine Visit  CHIEF COMPLAINT:  Management of MS, S/P ORIF of ankle fracture, Constipation and Hypertension  HISTORY OF PRESENT ILLNESS:   REASSESSMENT OF ONGOING PROBLEM(S):  HTN: Pt 's HTN remains stable.  Denies CP, sob, DOE, pedal edema, headaches, dizziness or visual disturbances.  No complications from the medications currently being used.  Last BP : 130/80  MULTIPLE SCLEROSIS : Stable; no complaints of visual change nor numbness/tingling sensation  CONSTIPATION: The constipation remains stable. She complains of difficulty swallowing capsule. Patient denies ongoing constipation, abdominal pain, nausea or vomiting.   PAST MEDICAL HISTORY : Reviewed.  No changes.  CURRENT MEDICATIONS: [facility]  REVIEW OF SYSTEMS:  GENERAL: no change in appetite, no fatigue, no weight changes, no fever, chills or weakness RESPIRATORY: no cough, SOB, DOE, wheezing, hemoptysis CARDIAC: no chest pain, or palpitations GI: no abdominal pain, diarrhea, constipation, heart burn, nausea or vomiting PSYCHIATRIC: +anxiety  PHYSICAL EXAMINATION  GENERAL: no acute distress, morbidly obese NECK: supple, trachea midline, no neck masses, no thyroid tenderness, no thyromegaly LYMPHATICS: no LAN in the neck, no supraclavicular LAN RESPIRATORY: breathing is even & unlabored, BS CTAB CARDIAC: RRR, no murmur,no extra heart sounds GI: abdomen soft, normal BS, no masses, no tenderness, no hepatomegaly, no splenomegaly EXTREMITIES: able to move all 4 extremities PSYCHIATRIC: the patient is alert & oriented to person, anxious, crying  LABS/RADIOLOGY: Labs reviewed: 2//13/15 sodium 134 potassium 3.8 glucose 126 BUN 11 creatinine 0.7 calcium 9.8  Basic Metabolic Panel:  Recent  Labs  08/25/12 0653 08/26/12 0445 03/30/13 1013  NA 139 136 139  K 3.5 3.6 4.0  CL 104 105 102  CO2 26 23 24   GLUCOSE 105* 101* 99  BUN 13 12 10   CREATININE 0.80 0.80 0.73  CALCIUM 8.7 8.3* 8.8   Liver Function Tests:  Recent Labs  08/26/12 0445  AST 15  ALT 11  ALKPHOS 94  BILITOT 0.2*  PROT 6.7  ALBUMIN 3.0*   CBC:  Recent Labs  08/25/12 0653 08/26/12 0445 03/30/13 1013  WBC 9.0 8.4 9.8  NEUTROABS 4.5  --   --   HGB 14.5 13.3 14.4  HCT 40.9 39.4 42.1  MCV 93.6 94.7 94.8  PLT 304 292 333   Cardiac Enzymes:  Recent Labs  08/25/12 0653 08/25/12 1652 08/25/12 2250  TROPONINI <0.30 <0.30 <0.30   CBG:  Recent Labs  03/30/13 2125  GLUCAP 111*     ASSESSMENT/PLAN:  Displaced bimalleolar fracture of the left ankle status post ORIF - continue rehabilitation; started walking with walker  Multiple sclerosis - stable  Constipation - stable  Hypertension - well-controlled  Anxiety - increase Ativan 1 mg PO Q AM and 0.5 mg PO Q PM   CPT CODE: [id number]  Ella BodoMonina Vargas - NP [facility] [phone]

## 2013-05-13 NOTE — Telephone Encounter (Signed)
[facility] 

## 2013-05-25 ENCOUNTER — Other Ambulatory Visit: Payer: Self-pay | Admitting: *Deleted

## 2013-05-25 MED ORDER — OXYCODONE HCL 5 MG PO TABS: ORAL_TABLET | ORAL | Status: DC

## 2013-05-25 NOTE — Telephone Encounter (Signed)
[facility] 

## 2013-06-10 ENCOUNTER — Encounter: Payer: Self-pay | Admitting: Internal Medicine

## 2013-06-10 ENCOUNTER — Non-Acute Institutional Stay (SKILLED_NURSING_FACILITY): Payer: Medicare PPO | Admitting: Internal Medicine

## 2013-06-10 DIAGNOSIS — I10 Essential (primary) hypertension: Secondary | ICD-10-CM

## 2013-06-10 DIAGNOSIS — S82843A Displaced bimalleolar fracture of unspecified lower leg, initial encounter for closed fracture: Secondary | ICD-10-CM

## 2013-06-10 DIAGNOSIS — K5900 Constipation, unspecified: Secondary | ICD-10-CM

## 2013-06-10 DIAGNOSIS — S82842A Displaced bimalleolar fracture of left lower leg, initial encounter for closed fracture: Secondary | ICD-10-CM

## 2013-06-10 DIAGNOSIS — G35 Multiple sclerosis: Secondary | ICD-10-CM

## 2013-06-10 DIAGNOSIS — F419 Anxiety disorder, unspecified: Secondary | ICD-10-CM

## 2013-06-10 DIAGNOSIS — F411 Generalized anxiety disorder: Secondary | ICD-10-CM

## 2013-06-10 NOTE — Progress Notes (Signed)
MRN: [id number] Name: [name]  Sex: female Age: 43 y.o. DOB: 10/15/1970  [facility] #: [facility] Facility/Room: 207 Level Of Care: SNF Provider: Merrilee SeashoreALEXANDER, Alvin Rubano D Emergency Contacts: Extended Emergency Contact Information Primary Emergency Contact: Costilla,Judy Address: [address]          [place], [place] [id number] [place] Home Phone: [phone] Mobile Phone: [phone] Relation: Mother Secondary Emergency Contact: Olds,Jennifer  [place] Mobile Phone: [phone] Relation: Sister  Code Status: FULL  Allergies: Review of patient's allergies indicates no known allergies.  Chief Complaint  Patient presents with  . Discharge Note    HPI: Patient is 43 y.o. female with MS and bimalleolar fx who was admitted for rehab who is now ready to be discharged to home.  Past Medical History  Diagnosis Date  . MS (multiple sclerosis)   . Arthritis     Past Surgical History  Procedure Laterality Date  . No past surgeries    . Orif ankle fracture Left 03/30/2013    Procedure: OPEN REDUCTION INTERNAL FIXATION (ORIF) LEFT ANKLE FRACTURE;  Surgeon: Toni ArthursJohn Hewitt, MD;  Location: [facility] OR;  Service: Orthopedics;  Laterality: Left;      Medication List       This list is accurate as of: 06/10/13  4:09 PM.  Always use your most recent med list.               aspirin EC 325 MG tablet  Take 325 mg by mouth daily.     cetirizine 10 MG tablet  Commonly known as:  ZYRTEC  Take 10 mg by mouth daily.     docusate sodium 100 MG capsule  Commonly known as:  COLACE  Take 100 mg by mouth 2 (two) times daily.     hydrochlorothiazide 25 MG tablet  Commonly known as:  HYDRODIURIL  Take 25 mg by mouth daily.     HYDROcodone-acetaminophen 5-325 MG per tablet  Commonly known as:  NORCO/VICODIN  Take 1-2 tablets by mouth every 6 (six) hours as needed (for pain).     LORazepam 1 MG tablet  Commonly known as:  ATIVAN  Take 1 mg by mouth daily. In AM     LORazepam 0.5 MG tablet  Commonly known as:  ATIVAN  Take one tablet by mouth every evening for anxiety     methocarbamol 500 MG tablet  Commonly known as:  ROBAXIN  Take 500 mg by mouth every 6 (six) hours as needed for muscle spasms. For spasms     oxyCODONE 5 MG immediate release tablet  Commonly known as:  Oxy IR/ROXICODONE  Take one tablet by mouth every four hours as needed for mild pain; Take two tablets by mouth every four hours as needed for moderate to severe pain     polyethylene glycol packet  Commonly known as:  MIRALAX / GLYCOLAX  Take 17 g by mouth daily. Mix with 6-8 oz of liquid daily for constipation.     SENOKOT S PO  Take 2 tablets by mouth at bedtime as needed and may repeat dose one time if needed.        Meds ordered this encounter  Medications  . cetirizine (ZYRTEC) 10 MG tablet    Sig: Take 10 mg by mouth daily.  Marland Kitchen. aspirin EC 325 MG tablet    Sig: Take 325 mg by mouth daily.  . hydrochlorothiazide (HYDRODIURIL) 25 MG tablet    Sig: Take 25 mg by mouth daily.  .Marland Kitchen  oxyCODONE (OXY IR/ROXICODONE) 5 MG immediate release tablet    Sig: Take one tablet by mouth every four hours as needed for mild pain; Take two tablets by mouth every four hours as needed for moderate to severe pain    Immunization History  Administered Date(s) Administered  . Influenza,inj,Quad PF,36+ Mos 03/31/2013    History  Substance Use Topics  . Smoking status: Former Smoker -- 2.00 packs/day for 20 years    Quit date: 07/03/2008  . Smokeless tobacco: Current User     Comment: USES ELECTRONIC CIGARETTES  . Alcohol Use: No    Filed Vitals:   06/10/13 1545  BP: 114/74  Pulse: 84  Temp: 96.4 F (35.8 C)  Resp: 18    Physical Exam  GENERAL APPEARANCE: Alert, conversant. Appropriately groomed. No acute distress.  HEENT: Unremarkable. RESPIRATORY: Breathing is even, unlabored. Lung sounds are clear   CARDIOVASCULAR: Heart RRR no murmurs, rubs or gallops. No peripheral  edema.  GASTROINTESTINAL: Abdomen is soft, non-tender, not distended w/ normal bowel sounds.  NEUROLOGIC: Cranial nerves 2-12 grossly intact. Moves all extremities no tremor.  Patient Active Problem List   Diagnosis Date Noted  . Anxiety 05/13/2013  . Essential hypertension, benign 04/25/2013  . Encounter for long-term (current) use of other medications 04/25/2013  . Constipation 04/09/2013  . Displaced bimalleolar fracture of left ankle 03/30/2013  . Multiple sclerosis 08/25/2012  . Chest pain 08/25/2012  . Panic attack 08/25/2012    CBC    Component Value Date/Time   WBC 9.8 03/30/2013 1013   RBC 4.44 03/30/2013 1013   HGB 14.4 03/30/2013 1013   HCT 42.1 03/30/2013 1013   PLT 333 03/30/2013 1013   MCV 94.8 03/30/2013 1013   LYMPHSABS 3.5 08/25/2012 0653   MONOABS 0.7 08/25/2012 0653   EOSABS 0.3 08/25/2012 0653   BASOSABS 0.0 08/25/2012 0653    CMP     Component Value Date/Time   NA 139 03/30/2013 1013   K 4.0 03/30/2013 1013   CL 102 03/30/2013 1013   CO2 24 03/30/2013 1013   GLUCOSE 99 03/30/2013 1013   BUN 10 03/30/2013 1013   CREATININE 0.73 03/30/2013 1013   CALCIUM 8.8 03/30/2013 1013   PROT 6.7 08/26/2012 0445   ALBUMIN 3.0* 08/26/2012 0445   AST 15 08/26/2012 0445   ALT 11 08/26/2012 0445   ALKPHOS 94 08/26/2012 0445   BILITOT 0.2* 08/26/2012 0445   GFRNONAA >90 03/30/2013 1013   GFRAA >90 03/30/2013 1013    Assessment and Plan  Pt is being d/c to home with HH/PT/OT/nursing/CNA/social worker.  Margit Hanks, MD

## 2013-07-14 ENCOUNTER — Other Ambulatory Visit: Payer: Self-pay | Admitting: Orthopedic Surgery

## 2013-07-14 DIAGNOSIS — S72413A Displaced unspecified condyle fracture of lower end of unspecified femur, initial encounter for closed fracture: Secondary | ICD-10-CM

## 2013-07-19 ENCOUNTER — Ambulatory Visit
Admission: RE | Admit: 2013-07-19 | Discharge: 2013-07-19 | Disposition: A | Discharge status: Home / Self Care | Payer: Medicare PPO | Source: Ambulatory Visit | Attending: Orthopedic Surgery | Admitting: Orthopedic Surgery

## 2013-07-19 DIAGNOSIS — S72413A Displaced unspecified condyle fracture of lower end of unspecified femur, initial encounter for closed fracture: Secondary | ICD-10-CM

## 2014-03-16 DIAGNOSIS — Y92099 Unspecified place in other non-institutional residence as the place of occurrence of the external cause: Secondary | ICD-10-CM | POA: Diagnosis not present

## 2014-03-16 DIAGNOSIS — M6281 Muscle weakness (generalized): Secondary | ICD-10-CM | POA: Diagnosis not present

## 2014-03-16 DIAGNOSIS — I10 Essential (primary) hypertension: Secondary | ICD-10-CM | POA: Diagnosis not present

## 2014-03-16 DIAGNOSIS — W19XXXD Unspecified fall, subsequent encounter: Secondary | ICD-10-CM | POA: Diagnosis not present

## 2014-03-16 DIAGNOSIS — S82842D Displaced bimalleolar fracture of left lower leg, subsequent encounter for closed fracture with routine healing: Secondary | ICD-10-CM | POA: Diagnosis not present

## 2014-03-16 DIAGNOSIS — G35 Multiple sclerosis: Secondary | ICD-10-CM | POA: Diagnosis not present

## 2014-03-24 DIAGNOSIS — G35 Multiple sclerosis: Secondary | ICD-10-CM | POA: Diagnosis not present

## 2014-03-24 DIAGNOSIS — S82842D Displaced bimalleolar fracture of left lower leg, subsequent encounter for closed fracture with routine healing: Secondary | ICD-10-CM | POA: Diagnosis not present

## 2014-03-24 DIAGNOSIS — Y92099 Unspecified place in other non-institutional residence as the place of occurrence of the external cause: Secondary | ICD-10-CM | POA: Diagnosis not present

## 2014-03-24 DIAGNOSIS — W19XXXD Unspecified fall, subsequent encounter: Secondary | ICD-10-CM | POA: Diagnosis not present

## 2014-03-24 DIAGNOSIS — I10 Essential (primary) hypertension: Secondary | ICD-10-CM | POA: Diagnosis not present

## 2014-03-24 DIAGNOSIS — M6281 Muscle weakness (generalized): Secondary | ICD-10-CM | POA: Diagnosis not present

## 2014-04-06 DIAGNOSIS — M6281 Muscle weakness (generalized): Secondary | ICD-10-CM | POA: Diagnosis not present

## 2014-04-06 DIAGNOSIS — S82842D Displaced bimalleolar fracture of left lower leg, subsequent encounter for closed fracture with routine healing: Secondary | ICD-10-CM | POA: Diagnosis not present

## 2014-04-06 DIAGNOSIS — W19XXXD Unspecified fall, subsequent encounter: Secondary | ICD-10-CM | POA: Diagnosis not present

## 2014-04-06 DIAGNOSIS — G35 Multiple sclerosis: Secondary | ICD-10-CM | POA: Diagnosis not present

## 2014-04-06 DIAGNOSIS — I10 Essential (primary) hypertension: Secondary | ICD-10-CM | POA: Diagnosis not present

## 2014-04-06 DIAGNOSIS — Y92099 Unspecified place in other non-institutional residence as the place of occurrence of the external cause: Secondary | ICD-10-CM | POA: Diagnosis not present

## 2014-06-15 DIAGNOSIS — G35 Multiple sclerosis: Secondary | ICD-10-CM | POA: Diagnosis not present

## 2014-06-15 DIAGNOSIS — I10 Essential (primary) hypertension: Secondary | ICD-10-CM | POA: Diagnosis not present

## 2014-06-15 DIAGNOSIS — Y92099 Unspecified place in other non-institutional residence as the place of occurrence of the external cause: Secondary | ICD-10-CM | POA: Diagnosis not present

## 2014-06-15 DIAGNOSIS — M6281 Muscle weakness (generalized): Secondary | ICD-10-CM | POA: Diagnosis not present

## 2014-06-15 DIAGNOSIS — S82842D Displaced bimalleolar fracture of left lower leg, subsequent encounter for closed fracture with routine healing: Secondary | ICD-10-CM | POA: Diagnosis not present

## 2014-06-15 DIAGNOSIS — W19XXXD Unspecified fall, subsequent encounter: Secondary | ICD-10-CM | POA: Diagnosis not present

## 2014-06-24 DIAGNOSIS — G35 Multiple sclerosis: Secondary | ICD-10-CM | POA: Diagnosis not present

## 2014-06-24 DIAGNOSIS — M6281 Muscle weakness (generalized): Secondary | ICD-10-CM | POA: Diagnosis not present

## 2014-06-24 DIAGNOSIS — I10 Essential (primary) hypertension: Secondary | ICD-10-CM | POA: Diagnosis not present

## 2014-06-24 DIAGNOSIS — W19XXXD Unspecified fall, subsequent encounter: Secondary | ICD-10-CM | POA: Diagnosis not present

## 2014-06-24 DIAGNOSIS — S82842D Displaced bimalleolar fracture of left lower leg, subsequent encounter for closed fracture with routine healing: Secondary | ICD-10-CM | POA: Diagnosis not present

## 2014-06-24 DIAGNOSIS — Y92099 Unspecified place in other non-institutional residence as the place of occurrence of the external cause: Secondary | ICD-10-CM | POA: Diagnosis not present

## 2014-06-29 DIAGNOSIS — W19XXXD Unspecified fall, subsequent encounter: Secondary | ICD-10-CM | POA: Diagnosis not present

## 2014-06-29 DIAGNOSIS — I10 Essential (primary) hypertension: Secondary | ICD-10-CM | POA: Diagnosis not present

## 2014-06-29 DIAGNOSIS — S82842D Displaced bimalleolar fracture of left lower leg, subsequent encounter for closed fracture with routine healing: Secondary | ICD-10-CM | POA: Diagnosis not present

## 2014-06-29 DIAGNOSIS — G35 Multiple sclerosis: Secondary | ICD-10-CM | POA: Diagnosis not present

## 2014-06-29 DIAGNOSIS — M6281 Muscle weakness (generalized): Secondary | ICD-10-CM | POA: Diagnosis not present

## 2014-06-29 DIAGNOSIS — Y92099 Unspecified place in other non-institutional residence as the place of occurrence of the external cause: Secondary | ICD-10-CM | POA: Diagnosis not present

## 2014-07-06 DIAGNOSIS — G35 Multiple sclerosis: Secondary | ICD-10-CM | POA: Diagnosis not present

## 2014-07-06 DIAGNOSIS — Y92099 Unspecified place in other non-institutional residence as the place of occurrence of the external cause: Secondary | ICD-10-CM | POA: Diagnosis not present

## 2014-07-06 DIAGNOSIS — I10 Essential (primary) hypertension: Secondary | ICD-10-CM | POA: Diagnosis not present

## 2014-07-06 DIAGNOSIS — M6281 Muscle weakness (generalized): Secondary | ICD-10-CM | POA: Diagnosis not present

## 2014-07-06 DIAGNOSIS — W19XXXD Unspecified fall, subsequent encounter: Secondary | ICD-10-CM | POA: Diagnosis not present

## 2014-07-06 DIAGNOSIS — S82842D Displaced bimalleolar fracture of left lower leg, subsequent encounter for closed fracture with routine healing: Secondary | ICD-10-CM | POA: Diagnosis not present

## 2014-07-20 DIAGNOSIS — Y92099 Unspecified place in other non-institutional residence as the place of occurrence of the external cause: Secondary | ICD-10-CM | POA: Diagnosis not present

## 2014-07-20 DIAGNOSIS — G35 Multiple sclerosis: Secondary | ICD-10-CM | POA: Diagnosis not present

## 2014-07-20 DIAGNOSIS — S82842D Displaced bimalleolar fracture of left lower leg, subsequent encounter for closed fracture with routine healing: Secondary | ICD-10-CM | POA: Diagnosis not present

## 2014-07-20 DIAGNOSIS — I10 Essential (primary) hypertension: Secondary | ICD-10-CM | POA: Diagnosis not present

## 2014-07-20 DIAGNOSIS — M6281 Muscle weakness (generalized): Secondary | ICD-10-CM | POA: Diagnosis not present

## 2014-07-20 DIAGNOSIS — W19XXXD Unspecified fall, subsequent encounter: Secondary | ICD-10-CM | POA: Diagnosis not present

## 2014-07-25 IMAGING — CT CT KNEE*L* W/O CM
3 series · 8 of 14 positions shown, 9 images · non-contrast
Comparison: Radiographs 03/17/2013.

CLINICAL DATA: Fall 4 weeks ago during physical therapy. Inability
to extend knee. Evaluate for fracture of femoral condyle.

EXAM:
CT OF THE LEFT KNEE WITHOUT CONTRAST
TECHNIQUE: Multidetector CT imaging of the left knee was performed according to
the standard protocol. Multiplanar CT image reconstructions were
also generated.

[Series 5: knee soft · axial · 0.39mm/px · z∈[-75,+25]mm · 3 of 82 slices shown, 4 images]
[im 21/82  soft-tissue]
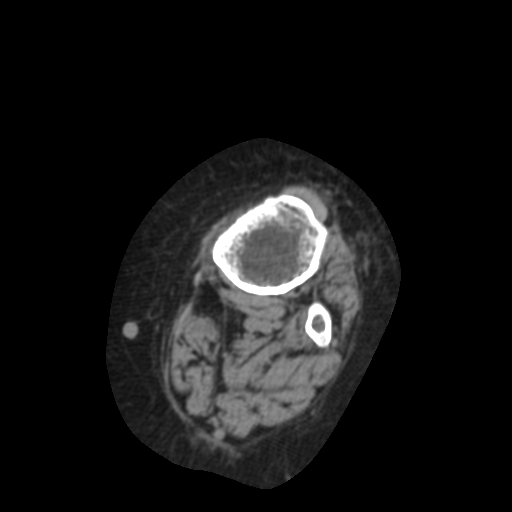
[im 21/82  bone]
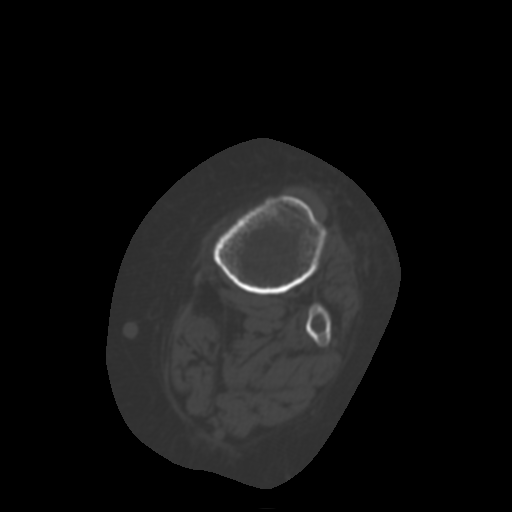
[im 41/82  bone]
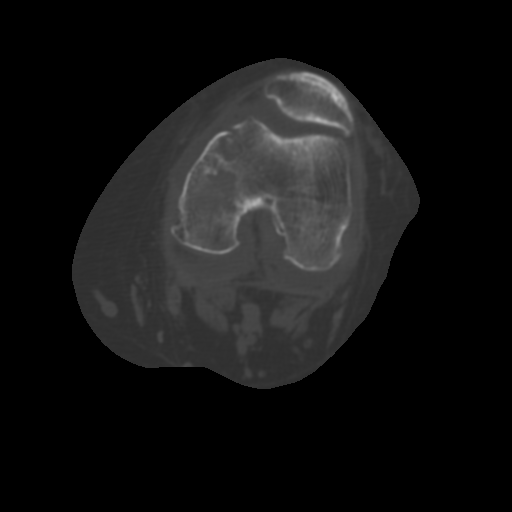
[im 61/82  bone]
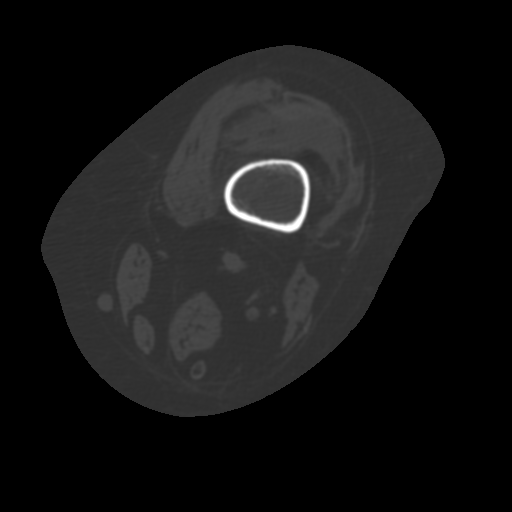

[Series 300: sag soft · sagittal · 0.41mm/px · 2 of 76 slices shown]
[im 26/76  soft-tissue]
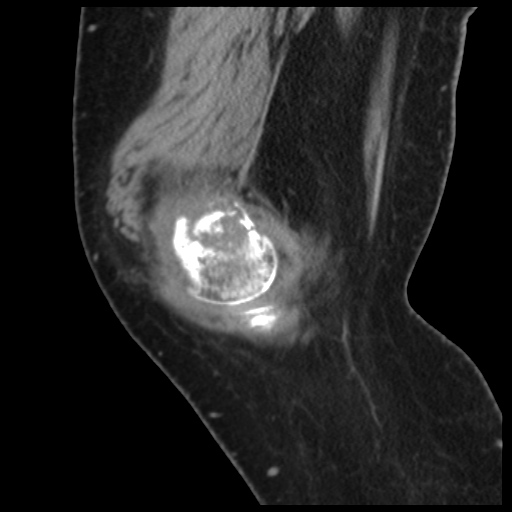
[im 51/76  soft-tissue]
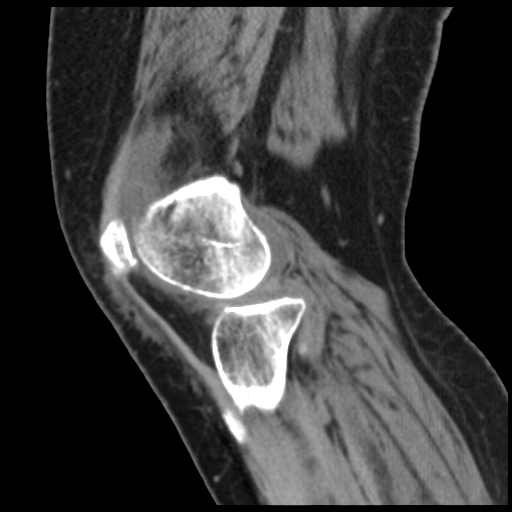

[Series 301: cor soft · coronal · 0.41mm/px · 3 of 82 slices shown]
[im 21/82  soft-tissue]
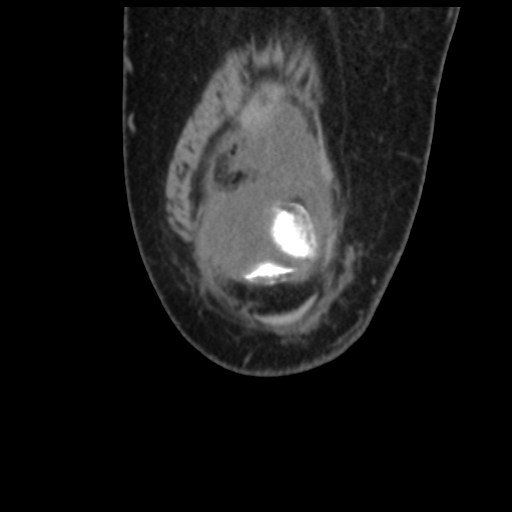
[im 41/82  soft-tissue]
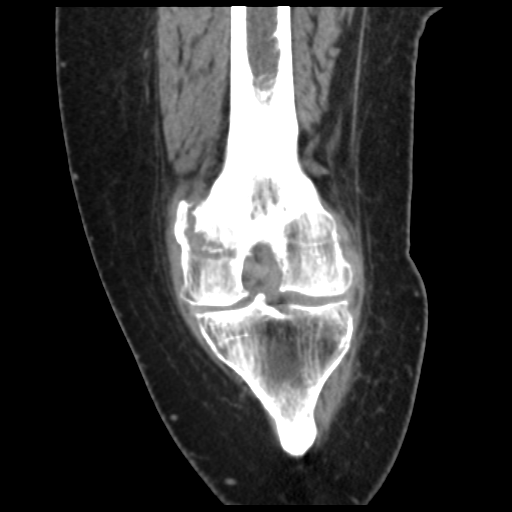
[im 61/82  soft-tissue]
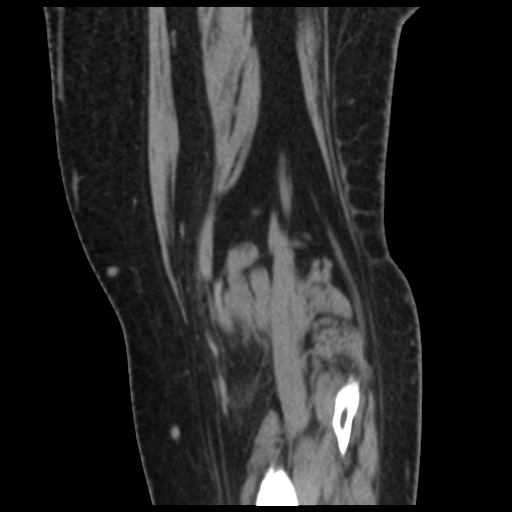

[8 of 14 positions shown; findings below may reference images not displayed]

FINDINGS: The bones appear mildly demineralized. There is an oblique mildly
displaced fracture through the the medial femoral condyle. This
involves the articular surface anteriorly and demonstrates up to 3
mm of offset at the articular surface. The fracture extends
posteriorly to involve the metaphysis. This component involves the
medial aspect of the intercondylar notch. However, there is no
significant intercondylar extension.

The lateral femoral condyle is intact. The patella, proximal tibia
and proximal fibula are intact.

Relatively mild underlying tricompartmental degenerative changes are
noted, greatest within the medial compartment. No loose bodies are
observed. There is a moderate size knee joint effusion. As evaluated
by CT, the extensor mechanism and cruciate ligaments are grossly
intact
IMPRESSION: Mildly displaced subacute appearing intra-articular fracture of the
medial femoral condyle with involvement of the articular surface
anteriorly as described. Associated moderate size hemarthrosis. No
loose bodies demonstrated.

## 2014-07-27 DIAGNOSIS — G35 Multiple sclerosis: Secondary | ICD-10-CM | POA: Diagnosis not present

## 2014-07-27 DIAGNOSIS — Y92099 Unspecified place in other non-institutional residence as the place of occurrence of the external cause: Secondary | ICD-10-CM | POA: Diagnosis not present

## 2014-07-27 DIAGNOSIS — I10 Essential (primary) hypertension: Secondary | ICD-10-CM | POA: Diagnosis not present

## 2014-07-27 DIAGNOSIS — M6281 Muscle weakness (generalized): Secondary | ICD-10-CM | POA: Diagnosis not present

## 2014-07-27 DIAGNOSIS — S82842D Displaced bimalleolar fracture of left lower leg, subsequent encounter for closed fracture with routine healing: Secondary | ICD-10-CM | POA: Diagnosis not present

## 2014-07-27 DIAGNOSIS — W19XXXD Unspecified fall, subsequent encounter: Secondary | ICD-10-CM | POA: Diagnosis not present

## 2014-08-03 DIAGNOSIS — W19XXXD Unspecified fall, subsequent encounter: Secondary | ICD-10-CM | POA: Diagnosis not present

## 2014-08-03 DIAGNOSIS — I10 Essential (primary) hypertension: Secondary | ICD-10-CM | POA: Diagnosis not present

## 2014-08-03 DIAGNOSIS — M6281 Muscle weakness (generalized): Secondary | ICD-10-CM | POA: Diagnosis not present

## 2014-08-03 DIAGNOSIS — Y92099 Unspecified place in other non-institutional residence as the place of occurrence of the external cause: Secondary | ICD-10-CM | POA: Diagnosis not present

## 2014-08-03 DIAGNOSIS — S82842D Displaced bimalleolar fracture of left lower leg, subsequent encounter for closed fracture with routine healing: Secondary | ICD-10-CM | POA: Diagnosis not present

## 2014-08-03 DIAGNOSIS — G35 Multiple sclerosis: Secondary | ICD-10-CM | POA: Diagnosis not present

## 2014-08-30 DIAGNOSIS — K219 Gastro-esophageal reflux disease without esophagitis: Secondary | ICD-10-CM | POA: Diagnosis not present

## 2014-08-30 DIAGNOSIS — G35 Multiple sclerosis: Secondary | ICD-10-CM | POA: Diagnosis not present

## 2014-08-30 DIAGNOSIS — M129 Arthropathy, unspecified: Secondary | ICD-10-CM | POA: Diagnosis not present

## 2014-08-30 DIAGNOSIS — M6240 Contracture of muscle, unspecified site: Secondary | ICD-10-CM | POA: Diagnosis not present

## 2014-09-28 DIAGNOSIS — I10 Essential (primary) hypertension: Secondary | ICD-10-CM | POA: Diagnosis not present

## 2014-09-28 DIAGNOSIS — G35 Multiple sclerosis: Secondary | ICD-10-CM | POA: Diagnosis not present

## 2014-09-28 DIAGNOSIS — F419 Anxiety disorder, unspecified: Secondary | ICD-10-CM | POA: Diagnosis not present

## 2014-09-28 DIAGNOSIS — Z8781 Personal history of (healed) traumatic fracture: Secondary | ICD-10-CM | POA: Diagnosis not present

## 2014-09-29 DIAGNOSIS — G35 Multiple sclerosis: Secondary | ICD-10-CM | POA: Diagnosis not present

## 2014-09-29 DIAGNOSIS — M6240 Contracture of muscle, unspecified site: Secondary | ICD-10-CM | POA: Diagnosis not present

## 2014-09-29 DIAGNOSIS — K219 Gastro-esophageal reflux disease without esophagitis: Secondary | ICD-10-CM | POA: Diagnosis not present

## 2014-09-29 DIAGNOSIS — M129 Arthropathy, unspecified: Secondary | ICD-10-CM | POA: Diagnosis not present

## 2014-09-30 DIAGNOSIS — F419 Anxiety disorder, unspecified: Secondary | ICD-10-CM | POA: Diagnosis not present

## 2014-09-30 DIAGNOSIS — G35 Multiple sclerosis: Secondary | ICD-10-CM | POA: Diagnosis not present

## 2014-09-30 DIAGNOSIS — I10 Essential (primary) hypertension: Secondary | ICD-10-CM | POA: Diagnosis not present

## 2014-09-30 DIAGNOSIS — Z8781 Personal history of (healed) traumatic fracture: Secondary | ICD-10-CM | POA: Diagnosis not present

## 2014-10-03 DIAGNOSIS — Z8781 Personal history of (healed) traumatic fracture: Secondary | ICD-10-CM | POA: Diagnosis not present

## 2014-10-03 DIAGNOSIS — F419 Anxiety disorder, unspecified: Secondary | ICD-10-CM | POA: Diagnosis not present

## 2014-10-03 DIAGNOSIS — G35 Multiple sclerosis: Secondary | ICD-10-CM | POA: Diagnosis not present

## 2014-10-03 DIAGNOSIS — I10 Essential (primary) hypertension: Secondary | ICD-10-CM | POA: Diagnosis not present

## 2014-10-05 DIAGNOSIS — G35 Multiple sclerosis: Secondary | ICD-10-CM | POA: Diagnosis not present

## 2014-10-05 DIAGNOSIS — Z8781 Personal history of (healed) traumatic fracture: Secondary | ICD-10-CM | POA: Diagnosis not present

## 2014-10-05 DIAGNOSIS — I10 Essential (primary) hypertension: Secondary | ICD-10-CM | POA: Diagnosis not present

## 2014-10-05 DIAGNOSIS — F419 Anxiety disorder, unspecified: Secondary | ICD-10-CM | POA: Diagnosis not present

## 2014-10-06 DIAGNOSIS — I10 Essential (primary) hypertension: Secondary | ICD-10-CM | POA: Diagnosis not present

## 2014-10-06 DIAGNOSIS — Z8781 Personal history of (healed) traumatic fracture: Secondary | ICD-10-CM | POA: Diagnosis not present

## 2014-10-06 DIAGNOSIS — F419 Anxiety disorder, unspecified: Secondary | ICD-10-CM | POA: Diagnosis not present

## 2014-10-06 DIAGNOSIS — G35 Multiple sclerosis: Secondary | ICD-10-CM | POA: Diagnosis not present

## 2014-10-12 DIAGNOSIS — G35 Multiple sclerosis: Secondary | ICD-10-CM | POA: Diagnosis not present

## 2014-10-12 DIAGNOSIS — I10 Essential (primary) hypertension: Secondary | ICD-10-CM | POA: Diagnosis not present

## 2014-10-12 DIAGNOSIS — F419 Anxiety disorder, unspecified: Secondary | ICD-10-CM | POA: Diagnosis not present

## 2014-10-12 DIAGNOSIS — Z8781 Personal history of (healed) traumatic fracture: Secondary | ICD-10-CM | POA: Diagnosis not present

## 2014-10-19 DIAGNOSIS — F419 Anxiety disorder, unspecified: Secondary | ICD-10-CM | POA: Diagnosis not present

## 2014-10-19 DIAGNOSIS — I10 Essential (primary) hypertension: Secondary | ICD-10-CM | POA: Diagnosis not present

## 2014-10-19 DIAGNOSIS — M129 Arthropathy, unspecified: Secondary | ICD-10-CM | POA: Diagnosis not present

## 2014-10-19 DIAGNOSIS — M6240 Contracture of muscle, unspecified site: Secondary | ICD-10-CM | POA: Diagnosis not present

## 2014-10-19 DIAGNOSIS — Z8781 Personal history of (healed) traumatic fracture: Secondary | ICD-10-CM | POA: Diagnosis not present

## 2014-10-19 DIAGNOSIS — K219 Gastro-esophageal reflux disease without esophagitis: Secondary | ICD-10-CM | POA: Diagnosis not present

## 2014-10-19 DIAGNOSIS — G35 Multiple sclerosis: Secondary | ICD-10-CM | POA: Diagnosis not present

## 2014-10-20 DIAGNOSIS — F419 Anxiety disorder, unspecified: Secondary | ICD-10-CM | POA: Diagnosis not present

## 2014-10-20 DIAGNOSIS — Z8781 Personal history of (healed) traumatic fracture: Secondary | ICD-10-CM | POA: Diagnosis not present

## 2014-10-20 DIAGNOSIS — I10 Essential (primary) hypertension: Secondary | ICD-10-CM | POA: Diagnosis not present

## 2014-10-20 DIAGNOSIS — G35 Multiple sclerosis: Secondary | ICD-10-CM | POA: Diagnosis not present

## 2014-10-26 DIAGNOSIS — I10 Essential (primary) hypertension: Secondary | ICD-10-CM | POA: Diagnosis not present

## 2014-10-26 DIAGNOSIS — G35 Multiple sclerosis: Secondary | ICD-10-CM | POA: Diagnosis not present

## 2014-10-26 DIAGNOSIS — Z8781 Personal history of (healed) traumatic fracture: Secondary | ICD-10-CM | POA: Diagnosis not present

## 2014-10-26 DIAGNOSIS — F419 Anxiety disorder, unspecified: Secondary | ICD-10-CM | POA: Diagnosis not present

## 2014-11-09 DIAGNOSIS — Z8781 Personal history of (healed) traumatic fracture: Secondary | ICD-10-CM | POA: Diagnosis not present

## 2014-11-09 DIAGNOSIS — G35 Multiple sclerosis: Secondary | ICD-10-CM | POA: Diagnosis not present

## 2014-11-09 DIAGNOSIS — I10 Essential (primary) hypertension: Secondary | ICD-10-CM | POA: Diagnosis not present

## 2014-11-09 DIAGNOSIS — F419 Anxiety disorder, unspecified: Secondary | ICD-10-CM | POA: Diagnosis not present

## 2014-11-16 DIAGNOSIS — G35 Multiple sclerosis: Secondary | ICD-10-CM | POA: Diagnosis not present

## 2014-11-16 DIAGNOSIS — Z8781 Personal history of (healed) traumatic fracture: Secondary | ICD-10-CM | POA: Diagnosis not present

## 2014-11-16 DIAGNOSIS — F419 Anxiety disorder, unspecified: Secondary | ICD-10-CM | POA: Diagnosis not present

## 2014-11-16 DIAGNOSIS — I10 Essential (primary) hypertension: Secondary | ICD-10-CM | POA: Diagnosis not present

## 2014-11-22 DIAGNOSIS — G35 Multiple sclerosis: Secondary | ICD-10-CM | POA: Diagnosis not present

## 2014-11-22 DIAGNOSIS — M6240 Contracture of muscle, unspecified site: Secondary | ICD-10-CM | POA: Diagnosis not present

## 2014-11-22 DIAGNOSIS — G609 Hereditary and idiopathic neuropathy, unspecified: Secondary | ICD-10-CM | POA: Diagnosis not present

## 2014-11-23 DIAGNOSIS — G35 Multiple sclerosis: Secondary | ICD-10-CM | POA: Diagnosis not present

## 2014-11-23 DIAGNOSIS — F419 Anxiety disorder, unspecified: Secondary | ICD-10-CM | POA: Diagnosis not present

## 2014-11-23 DIAGNOSIS — I10 Essential (primary) hypertension: Secondary | ICD-10-CM | POA: Diagnosis not present

## 2014-11-23 DIAGNOSIS — Z8781 Personal history of (healed) traumatic fracture: Secondary | ICD-10-CM | POA: Diagnosis not present

## 2014-12-14 DIAGNOSIS — I10 Essential (primary) hypertension: Secondary | ICD-10-CM | POA: Diagnosis not present

## 2014-12-14 DIAGNOSIS — Z8781 Personal history of (healed) traumatic fracture: Secondary | ICD-10-CM | POA: Diagnosis not present

## 2014-12-14 DIAGNOSIS — G35 Multiple sclerosis: Secondary | ICD-10-CM | POA: Diagnosis not present

## 2014-12-14 DIAGNOSIS — F419 Anxiety disorder, unspecified: Secondary | ICD-10-CM | POA: Diagnosis not present

## 2014-12-27 DIAGNOSIS — Z8781 Personal history of (healed) traumatic fracture: Secondary | ICD-10-CM | POA: Diagnosis not present

## 2014-12-27 DIAGNOSIS — F419 Anxiety disorder, unspecified: Secondary | ICD-10-CM | POA: Diagnosis not present

## 2014-12-27 DIAGNOSIS — G35 Multiple sclerosis: Secondary | ICD-10-CM | POA: Diagnosis not present

## 2014-12-27 DIAGNOSIS — I10 Essential (primary) hypertension: Secondary | ICD-10-CM | POA: Diagnosis not present

## 2015-01-24 DIAGNOSIS — G6181 Chronic inflammatory demyelinating polyneuritis: Secondary | ICD-10-CM | POA: Diagnosis not present

## 2015-01-24 DIAGNOSIS — E559 Vitamin D deficiency, unspecified: Secondary | ICD-10-CM | POA: Diagnosis not present

## 2015-01-24 DIAGNOSIS — I10 Essential (primary) hypertension: Secondary | ICD-10-CM | POA: Diagnosis not present

## 2015-01-24 DIAGNOSIS — R252 Cramp and spasm: Secondary | ICD-10-CM | POA: Diagnosis not present

## 2015-01-24 DIAGNOSIS — G35 Multiple sclerosis: Secondary | ICD-10-CM | POA: Diagnosis not present

## 2015-01-24 DIAGNOSIS — Z131 Encounter for screening for diabetes mellitus: Secondary | ICD-10-CM | POA: Diagnosis not present

## 2015-04-27 DIAGNOSIS — G6181 Chronic inflammatory demyelinating polyneuritis: Secondary | ICD-10-CM | POA: Diagnosis not present

## 2015-04-27 DIAGNOSIS — R252 Cramp and spasm: Secondary | ICD-10-CM | POA: Diagnosis not present

## 2015-04-27 DIAGNOSIS — G35 Multiple sclerosis: Secondary | ICD-10-CM | POA: Diagnosis not present

## 2015-04-27 DIAGNOSIS — R32 Unspecified urinary incontinence: Secondary | ICD-10-CM | POA: Diagnosis not present

## 2015-05-19 DIAGNOSIS — F17210 Nicotine dependence, cigarettes, uncomplicated: Secondary | ICD-10-CM | POA: Diagnosis not present

## 2015-05-19 DIAGNOSIS — G35 Multiple sclerosis: Secondary | ICD-10-CM | POA: Diagnosis not present

## 2015-05-19 DIAGNOSIS — Z993 Dependence on wheelchair: Secondary | ICD-10-CM | POA: Diagnosis not present

## 2015-05-19 DIAGNOSIS — M1990 Unspecified osteoarthritis, unspecified site: Secondary | ICD-10-CM | POA: Diagnosis not present

## 2015-05-19 DIAGNOSIS — G629 Polyneuropathy, unspecified: Secondary | ICD-10-CM | POA: Diagnosis not present

## 2015-05-19 DIAGNOSIS — M62838 Other muscle spasm: Secondary | ICD-10-CM | POA: Diagnosis not present

## 2015-05-19 DIAGNOSIS — M545 Low back pain: Secondary | ICD-10-CM | POA: Diagnosis not present

## 2015-08-17 DIAGNOSIS — D559 Anemia due to enzyme disorder, unspecified: Secondary | ICD-10-CM | POA: Diagnosis not present

## 2015-08-17 DIAGNOSIS — G6181 Chronic inflammatory demyelinating polyneuritis: Secondary | ICD-10-CM | POA: Diagnosis not present

## 2015-08-17 DIAGNOSIS — R252 Cramp and spasm: Secondary | ICD-10-CM | POA: Diagnosis not present

## 2015-08-17 DIAGNOSIS — E559 Vitamin D deficiency, unspecified: Secondary | ICD-10-CM | POA: Diagnosis not present

## 2015-08-17 DIAGNOSIS — G35 Multiple sclerosis: Secondary | ICD-10-CM | POA: Diagnosis not present

## 2015-08-17 DIAGNOSIS — Z13220 Encounter for screening for lipoid disorders: Secondary | ICD-10-CM | POA: Diagnosis not present

## 2015-08-17 DIAGNOSIS — Z7401 Bed confinement status: Secondary | ICD-10-CM | POA: Diagnosis not present

## 2016-01-30 DIAGNOSIS — Z7401 Bed confinement status: Secondary | ICD-10-CM | POA: Diagnosis not present

## 2016-01-30 DIAGNOSIS — R252 Cramp and spasm: Secondary | ICD-10-CM | POA: Diagnosis not present

## 2016-01-30 DIAGNOSIS — G35 Multiple sclerosis: Secondary | ICD-10-CM | POA: Diagnosis not present

## 2016-01-30 DIAGNOSIS — R32 Unspecified urinary incontinence: Secondary | ICD-10-CM | POA: Diagnosis not present

## 2016-01-30 DIAGNOSIS — G6181 Chronic inflammatory demyelinating polyneuritis: Secondary | ICD-10-CM | POA: Diagnosis not present

## 2016-01-30 DIAGNOSIS — Z23 Encounter for immunization: Secondary | ICD-10-CM | POA: Diagnosis not present

## 2016-03-01 DIAGNOSIS — H6502 Acute serous otitis media, left ear: Secondary | ICD-10-CM | POA: Diagnosis not present

## 2016-03-01 DIAGNOSIS — Z7401 Bed confinement status: Secondary | ICD-10-CM | POA: Diagnosis not present

## 2016-03-01 DIAGNOSIS — H6091 Unspecified otitis externa, right ear: Secondary | ICD-10-CM | POA: Diagnosis not present

## 2016-03-01 DIAGNOSIS — G35 Multiple sclerosis: Secondary | ICD-10-CM | POA: Diagnosis not present

## 2016-05-15 DIAGNOSIS — H6091 Unspecified otitis externa, right ear: Secondary | ICD-10-CM | POA: Diagnosis not present

## 2016-05-15 DIAGNOSIS — H6502 Acute serous otitis media, left ear: Secondary | ICD-10-CM | POA: Diagnosis not present

## 2016-05-15 DIAGNOSIS — G35 Multiple sclerosis: Secondary | ICD-10-CM | POA: Diagnosis not present

## 2016-05-15 DIAGNOSIS — Z7401 Bed confinement status: Secondary | ICD-10-CM | POA: Diagnosis not present

## 2016-05-15 DIAGNOSIS — R252 Cramp and spasm: Secondary | ICD-10-CM | POA: Diagnosis not present

## 2016-08-06 DIAGNOSIS — R32 Unspecified urinary incontinence: Secondary | ICD-10-CM | POA: Diagnosis not present

## 2016-08-06 DIAGNOSIS — Z7401 Bed confinement status: Secondary | ICD-10-CM | POA: Diagnosis not present

## 2016-08-06 DIAGNOSIS — R252 Cramp and spasm: Secondary | ICD-10-CM | POA: Diagnosis not present

## 2016-08-06 DIAGNOSIS — G35 Multiple sclerosis: Secondary | ICD-10-CM | POA: Diagnosis not present

## 2016-08-21 DIAGNOSIS — G35 Multiple sclerosis: Secondary | ICD-10-CM | POA: Diagnosis not present

## 2016-08-21 DIAGNOSIS — M21372 Foot drop, left foot: Secondary | ICD-10-CM | POA: Diagnosis not present

## 2016-08-21 DIAGNOSIS — L89892 Pressure ulcer of other site, stage 2: Secondary | ICD-10-CM | POA: Diagnosis not present

## 2016-08-21 DIAGNOSIS — G629 Polyneuropathy, unspecified: Secondary | ICD-10-CM | POA: Diagnosis not present

## 2016-08-21 DIAGNOSIS — M1990 Unspecified osteoarthritis, unspecified site: Secondary | ICD-10-CM | POA: Diagnosis not present

## 2016-08-21 DIAGNOSIS — K219 Gastro-esophageal reflux disease without esophagitis: Secondary | ICD-10-CM | POA: Diagnosis not present

## 2016-08-23 DIAGNOSIS — K219 Gastro-esophageal reflux disease without esophagitis: Secondary | ICD-10-CM | POA: Diagnosis not present

## 2016-08-23 DIAGNOSIS — L89892 Pressure ulcer of other site, stage 2: Secondary | ICD-10-CM | POA: Diagnosis not present

## 2016-08-23 DIAGNOSIS — G35 Multiple sclerosis: Secondary | ICD-10-CM | POA: Diagnosis not present

## 2016-08-23 DIAGNOSIS — G629 Polyneuropathy, unspecified: Secondary | ICD-10-CM | POA: Diagnosis not present

## 2016-08-23 DIAGNOSIS — M21372 Foot drop, left foot: Secondary | ICD-10-CM | POA: Diagnosis not present

## 2016-08-23 DIAGNOSIS — M1990 Unspecified osteoarthritis, unspecified site: Secondary | ICD-10-CM | POA: Diagnosis not present

## 2016-08-26 DIAGNOSIS — L89892 Pressure ulcer of other site, stage 2: Secondary | ICD-10-CM | POA: Diagnosis not present

## 2016-08-26 DIAGNOSIS — K219 Gastro-esophageal reflux disease without esophagitis: Secondary | ICD-10-CM | POA: Diagnosis not present

## 2016-08-26 DIAGNOSIS — M1990 Unspecified osteoarthritis, unspecified site: Secondary | ICD-10-CM | POA: Diagnosis not present

## 2016-08-26 DIAGNOSIS — M21372 Foot drop, left foot: Secondary | ICD-10-CM | POA: Diagnosis not present

## 2016-08-26 DIAGNOSIS — G629 Polyneuropathy, unspecified: Secondary | ICD-10-CM | POA: Diagnosis not present

## 2016-08-26 DIAGNOSIS — G35 Multiple sclerosis: Secondary | ICD-10-CM | POA: Diagnosis not present

## 2016-08-29 DIAGNOSIS — M1990 Unspecified osteoarthritis, unspecified site: Secondary | ICD-10-CM | POA: Diagnosis not present

## 2016-08-29 DIAGNOSIS — L89892 Pressure ulcer of other site, stage 2: Secondary | ICD-10-CM | POA: Diagnosis not present

## 2016-08-29 DIAGNOSIS — K219 Gastro-esophageal reflux disease without esophagitis: Secondary | ICD-10-CM | POA: Diagnosis not present

## 2016-08-29 DIAGNOSIS — G35 Multiple sclerosis: Secondary | ICD-10-CM | POA: Diagnosis not present

## 2016-08-29 DIAGNOSIS — M21372 Foot drop, left foot: Secondary | ICD-10-CM | POA: Diagnosis not present

## 2016-08-29 DIAGNOSIS — G629 Polyneuropathy, unspecified: Secondary | ICD-10-CM | POA: Diagnosis not present

## 2016-09-03 DIAGNOSIS — L89892 Pressure ulcer of other site, stage 2: Secondary | ICD-10-CM | POA: Diagnosis not present

## 2016-09-03 DIAGNOSIS — G629 Polyneuropathy, unspecified: Secondary | ICD-10-CM | POA: Diagnosis not present

## 2016-09-03 DIAGNOSIS — M21372 Foot drop, left foot: Secondary | ICD-10-CM | POA: Diagnosis not present

## 2016-09-03 DIAGNOSIS — M1990 Unspecified osteoarthritis, unspecified site: Secondary | ICD-10-CM | POA: Diagnosis not present

## 2016-09-03 DIAGNOSIS — K219 Gastro-esophageal reflux disease without esophagitis: Secondary | ICD-10-CM | POA: Diagnosis not present

## 2016-09-03 DIAGNOSIS — G35 Multiple sclerosis: Secondary | ICD-10-CM | POA: Diagnosis not present

## 2016-09-05 DIAGNOSIS — K219 Gastro-esophageal reflux disease without esophagitis: Secondary | ICD-10-CM | POA: Diagnosis not present

## 2016-09-05 DIAGNOSIS — M21372 Foot drop, left foot: Secondary | ICD-10-CM | POA: Diagnosis not present

## 2016-09-05 DIAGNOSIS — G629 Polyneuropathy, unspecified: Secondary | ICD-10-CM | POA: Diagnosis not present

## 2016-09-05 DIAGNOSIS — M1990 Unspecified osteoarthritis, unspecified site: Secondary | ICD-10-CM | POA: Diagnosis not present

## 2016-09-05 DIAGNOSIS — G35 Multiple sclerosis: Secondary | ICD-10-CM | POA: Diagnosis not present

## 2016-09-05 DIAGNOSIS — L89892 Pressure ulcer of other site, stage 2: Secondary | ICD-10-CM | POA: Diagnosis not present

## 2016-09-10 DIAGNOSIS — M1990 Unspecified osteoarthritis, unspecified site: Secondary | ICD-10-CM | POA: Diagnosis not present

## 2016-09-10 DIAGNOSIS — G629 Polyneuropathy, unspecified: Secondary | ICD-10-CM | POA: Diagnosis not present

## 2016-09-10 DIAGNOSIS — L89892 Pressure ulcer of other site, stage 2: Secondary | ICD-10-CM | POA: Diagnosis not present

## 2016-09-10 DIAGNOSIS — G35 Multiple sclerosis: Secondary | ICD-10-CM | POA: Diagnosis not present

## 2016-09-10 DIAGNOSIS — M21372 Foot drop, left foot: Secondary | ICD-10-CM | POA: Diagnosis not present

## 2016-09-10 DIAGNOSIS — K219 Gastro-esophageal reflux disease without esophagitis: Secondary | ICD-10-CM | POA: Diagnosis not present

## 2016-09-13 DIAGNOSIS — G35 Multiple sclerosis: Secondary | ICD-10-CM | POA: Diagnosis not present

## 2016-09-13 DIAGNOSIS — M1990 Unspecified osteoarthritis, unspecified site: Secondary | ICD-10-CM | POA: Diagnosis not present

## 2016-09-13 DIAGNOSIS — L89892 Pressure ulcer of other site, stage 2: Secondary | ICD-10-CM | POA: Diagnosis not present

## 2016-09-13 DIAGNOSIS — K219 Gastro-esophageal reflux disease without esophagitis: Secondary | ICD-10-CM | POA: Diagnosis not present

## 2016-09-13 DIAGNOSIS — G629 Polyneuropathy, unspecified: Secondary | ICD-10-CM | POA: Diagnosis not present

## 2016-09-13 DIAGNOSIS — M21372 Foot drop, left foot: Secondary | ICD-10-CM | POA: Diagnosis not present

## 2016-09-17 DIAGNOSIS — L89892 Pressure ulcer of other site, stage 2: Secondary | ICD-10-CM | POA: Diagnosis not present

## 2016-09-17 DIAGNOSIS — M1990 Unspecified osteoarthritis, unspecified site: Secondary | ICD-10-CM | POA: Diagnosis not present

## 2016-09-17 DIAGNOSIS — K219 Gastro-esophageal reflux disease without esophagitis: Secondary | ICD-10-CM | POA: Diagnosis not present

## 2016-09-17 DIAGNOSIS — G629 Polyneuropathy, unspecified: Secondary | ICD-10-CM | POA: Diagnosis not present

## 2016-09-17 DIAGNOSIS — M21372 Foot drop, left foot: Secondary | ICD-10-CM | POA: Diagnosis not present

## 2016-09-17 DIAGNOSIS — G35 Multiple sclerosis: Secondary | ICD-10-CM | POA: Diagnosis not present

## 2016-09-20 DIAGNOSIS — G35 Multiple sclerosis: Secondary | ICD-10-CM | POA: Diagnosis not present

## 2016-09-20 DIAGNOSIS — L89892 Pressure ulcer of other site, stage 2: Secondary | ICD-10-CM | POA: Diagnosis not present

## 2016-09-20 DIAGNOSIS — M1990 Unspecified osteoarthritis, unspecified site: Secondary | ICD-10-CM | POA: Diagnosis not present

## 2016-09-20 DIAGNOSIS — K219 Gastro-esophageal reflux disease without esophagitis: Secondary | ICD-10-CM | POA: Diagnosis not present

## 2016-09-20 DIAGNOSIS — G629 Polyneuropathy, unspecified: Secondary | ICD-10-CM | POA: Diagnosis not present

## 2016-09-20 DIAGNOSIS — M21372 Foot drop, left foot: Secondary | ICD-10-CM | POA: Diagnosis not present

## 2016-09-23 DIAGNOSIS — G35 Multiple sclerosis: Secondary | ICD-10-CM | POA: Diagnosis not present

## 2016-09-23 DIAGNOSIS — M1990 Unspecified osteoarthritis, unspecified site: Secondary | ICD-10-CM | POA: Diagnosis not present

## 2016-09-23 DIAGNOSIS — L89892 Pressure ulcer of other site, stage 2: Secondary | ICD-10-CM | POA: Diagnosis not present

## 2016-09-23 DIAGNOSIS — K219 Gastro-esophageal reflux disease without esophagitis: Secondary | ICD-10-CM | POA: Diagnosis not present

## 2016-09-23 DIAGNOSIS — M21372 Foot drop, left foot: Secondary | ICD-10-CM | POA: Diagnosis not present

## 2016-09-23 DIAGNOSIS — G629 Polyneuropathy, unspecified: Secondary | ICD-10-CM | POA: Diagnosis not present

## 2016-09-26 DIAGNOSIS — G629 Polyneuropathy, unspecified: Secondary | ICD-10-CM | POA: Diagnosis not present

## 2016-09-26 DIAGNOSIS — K219 Gastro-esophageal reflux disease without esophagitis: Secondary | ICD-10-CM | POA: Diagnosis not present

## 2016-09-26 DIAGNOSIS — M1990 Unspecified osteoarthritis, unspecified site: Secondary | ICD-10-CM | POA: Diagnosis not present

## 2016-09-26 DIAGNOSIS — M21372 Foot drop, left foot: Secondary | ICD-10-CM | POA: Diagnosis not present

## 2016-09-26 DIAGNOSIS — L89892 Pressure ulcer of other site, stage 2: Secondary | ICD-10-CM | POA: Diagnosis not present

## 2016-09-26 DIAGNOSIS — G35 Multiple sclerosis: Secondary | ICD-10-CM | POA: Diagnosis not present

## 2016-09-27 DIAGNOSIS — G35 Multiple sclerosis: Secondary | ICD-10-CM | POA: Diagnosis not present

## 2016-09-27 DIAGNOSIS — G629 Polyneuropathy, unspecified: Secondary | ICD-10-CM | POA: Diagnosis not present

## 2016-09-27 DIAGNOSIS — M1990 Unspecified osteoarthritis, unspecified site: Secondary | ICD-10-CM | POA: Diagnosis not present

## 2016-09-27 DIAGNOSIS — M21372 Foot drop, left foot: Secondary | ICD-10-CM | POA: Diagnosis not present

## 2016-09-27 DIAGNOSIS — K219 Gastro-esophageal reflux disease without esophagitis: Secondary | ICD-10-CM | POA: Diagnosis not present

## 2016-09-27 DIAGNOSIS — L89892 Pressure ulcer of other site, stage 2: Secondary | ICD-10-CM | POA: Diagnosis not present

## 2016-09-30 DIAGNOSIS — M21372 Foot drop, left foot: Secondary | ICD-10-CM | POA: Diagnosis not present

## 2016-09-30 DIAGNOSIS — G35 Multiple sclerosis: Secondary | ICD-10-CM | POA: Diagnosis not present

## 2016-09-30 DIAGNOSIS — K219 Gastro-esophageal reflux disease without esophagitis: Secondary | ICD-10-CM | POA: Diagnosis not present

## 2016-09-30 DIAGNOSIS — L89892 Pressure ulcer of other site, stage 2: Secondary | ICD-10-CM | POA: Diagnosis not present

## 2016-09-30 DIAGNOSIS — G629 Polyneuropathy, unspecified: Secondary | ICD-10-CM | POA: Diagnosis not present

## 2016-09-30 DIAGNOSIS — M1990 Unspecified osteoarthritis, unspecified site: Secondary | ICD-10-CM | POA: Diagnosis not present

## 2016-10-03 DIAGNOSIS — M1990 Unspecified osteoarthritis, unspecified site: Secondary | ICD-10-CM | POA: Diagnosis not present

## 2016-10-03 DIAGNOSIS — K219 Gastro-esophageal reflux disease without esophagitis: Secondary | ICD-10-CM | POA: Diagnosis not present

## 2016-10-03 DIAGNOSIS — M21372 Foot drop, left foot: Secondary | ICD-10-CM | POA: Diagnosis not present

## 2016-10-03 DIAGNOSIS — G629 Polyneuropathy, unspecified: Secondary | ICD-10-CM | POA: Diagnosis not present

## 2016-10-03 DIAGNOSIS — G35 Multiple sclerosis: Secondary | ICD-10-CM | POA: Diagnosis not present

## 2016-10-03 DIAGNOSIS — L89892 Pressure ulcer of other site, stage 2: Secondary | ICD-10-CM | POA: Diagnosis not present

## 2016-10-07 DIAGNOSIS — G629 Polyneuropathy, unspecified: Secondary | ICD-10-CM | POA: Diagnosis not present

## 2016-10-07 DIAGNOSIS — G35 Multiple sclerosis: Secondary | ICD-10-CM | POA: Diagnosis not present

## 2016-10-07 DIAGNOSIS — L89892 Pressure ulcer of other site, stage 2: Secondary | ICD-10-CM | POA: Diagnosis not present

## 2016-10-07 DIAGNOSIS — K219 Gastro-esophageal reflux disease without esophagitis: Secondary | ICD-10-CM | POA: Diagnosis not present

## 2016-10-07 DIAGNOSIS — M21372 Foot drop, left foot: Secondary | ICD-10-CM | POA: Diagnosis not present

## 2016-10-07 DIAGNOSIS — M1990 Unspecified osteoarthritis, unspecified site: Secondary | ICD-10-CM | POA: Diagnosis not present

## 2016-10-09 DIAGNOSIS — R8279 Other abnormal findings on microbiological examination of urine: Secondary | ICD-10-CM | POA: Diagnosis not present

## 2016-10-09 DIAGNOSIS — M1990 Unspecified osteoarthritis, unspecified site: Secondary | ICD-10-CM | POA: Diagnosis not present

## 2016-10-09 DIAGNOSIS — G629 Polyneuropathy, unspecified: Secondary | ICD-10-CM | POA: Diagnosis not present

## 2016-10-09 DIAGNOSIS — G35 Multiple sclerosis: Secondary | ICD-10-CM | POA: Diagnosis not present

## 2016-10-09 DIAGNOSIS — L89892 Pressure ulcer of other site, stage 2: Secondary | ICD-10-CM | POA: Diagnosis not present

## 2016-10-09 DIAGNOSIS — M21372 Foot drop, left foot: Secondary | ICD-10-CM | POA: Diagnosis not present

## 2016-10-09 DIAGNOSIS — R8290 Unspecified abnormal findings in urine: Secondary | ICD-10-CM | POA: Diagnosis not present

## 2016-10-09 DIAGNOSIS — K219 Gastro-esophageal reflux disease without esophagitis: Secondary | ICD-10-CM | POA: Diagnosis not present

## 2016-10-11 DIAGNOSIS — G35 Multiple sclerosis: Secondary | ICD-10-CM | POA: Diagnosis not present

## 2016-10-11 DIAGNOSIS — L89892 Pressure ulcer of other site, stage 2: Secondary | ICD-10-CM | POA: Diagnosis not present

## 2016-10-11 DIAGNOSIS — M21372 Foot drop, left foot: Secondary | ICD-10-CM | POA: Diagnosis not present

## 2016-10-11 DIAGNOSIS — M1990 Unspecified osteoarthritis, unspecified site: Secondary | ICD-10-CM | POA: Diagnosis not present

## 2016-10-11 DIAGNOSIS — K219 Gastro-esophageal reflux disease without esophagitis: Secondary | ICD-10-CM | POA: Diagnosis not present

## 2016-10-11 DIAGNOSIS — G629 Polyneuropathy, unspecified: Secondary | ICD-10-CM | POA: Diagnosis not present

## 2016-10-22 DIAGNOSIS — N390 Urinary tract infection, site not specified: Secondary | ICD-10-CM | POA: Diagnosis not present

## 2016-10-22 DIAGNOSIS — L89302 Pressure ulcer of unspecified buttock, stage 2: Secondary | ICD-10-CM | POA: Diagnosis not present

## 2016-10-22 DIAGNOSIS — G35 Multiple sclerosis: Secondary | ICD-10-CM | POA: Diagnosis not present

## 2016-10-22 DIAGNOSIS — R252 Cramp and spasm: Secondary | ICD-10-CM | POA: Diagnosis not present

## 2016-11-19 DIAGNOSIS — G35 Multiple sclerosis: Secondary | ICD-10-CM | POA: Diagnosis not present

## 2016-11-19 DIAGNOSIS — H6091 Unspecified otitis externa, right ear: Secondary | ICD-10-CM | POA: Diagnosis not present

## 2016-11-19 DIAGNOSIS — G6181 Chronic inflammatory demyelinating polyneuritis: Secondary | ICD-10-CM | POA: Diagnosis not present

## 2016-11-19 DIAGNOSIS — R252 Cramp and spasm: Secondary | ICD-10-CM | POA: Diagnosis not present

## 2016-12-18 DIAGNOSIS — I10 Essential (primary) hypertension: Secondary | ICD-10-CM | POA: Diagnosis not present

## 2016-12-18 DIAGNOSIS — Z23 Encounter for immunization: Secondary | ICD-10-CM | POA: Diagnosis not present

## 2016-12-18 DIAGNOSIS — G35 Multiple sclerosis: Secondary | ICD-10-CM | POA: Diagnosis not present

## 2016-12-18 DIAGNOSIS — H6091 Unspecified otitis externa, right ear: Secondary | ICD-10-CM | POA: Diagnosis not present

## 2017-04-02 DIAGNOSIS — R600 Localized edema: Secondary | ICD-10-CM | POA: Diagnosis not present

## 2017-04-02 DIAGNOSIS — M7989 Other specified soft tissue disorders: Secondary | ICD-10-CM | POA: Diagnosis not present

## 2017-04-02 DIAGNOSIS — N390 Urinary tract infection, site not specified: Secondary | ICD-10-CM | POA: Diagnosis not present

## 2017-04-02 DIAGNOSIS — M25562 Pain in left knee: Secondary | ICD-10-CM | POA: Diagnosis not present

## 2017-04-02 DIAGNOSIS — Z7401 Bed confinement status: Secondary | ICD-10-CM | POA: Diagnosis not present

## 2017-04-02 DIAGNOSIS — Z131 Encounter for screening for diabetes mellitus: Secondary | ICD-10-CM | POA: Diagnosis not present

## 2017-04-02 DIAGNOSIS — I10 Essential (primary) hypertension: Secondary | ICD-10-CM | POA: Diagnosis not present

## 2017-04-02 DIAGNOSIS — R32 Unspecified urinary incontinence: Secondary | ICD-10-CM | POA: Diagnosis not present

## 2017-04-02 DIAGNOSIS — M109 Gout, unspecified: Secondary | ICD-10-CM | POA: Diagnosis not present

## 2017-04-02 DIAGNOSIS — G35 Multiple sclerosis: Secondary | ICD-10-CM | POA: Diagnosis not present

## 2017-05-20 DIAGNOSIS — I10 Essential (primary) hypertension: Secondary | ICD-10-CM | POA: Diagnosis not present

## 2017-05-20 DIAGNOSIS — R32 Unspecified urinary incontinence: Secondary | ICD-10-CM | POA: Diagnosis not present

## 2017-05-20 DIAGNOSIS — G35 Multiple sclerosis: Secondary | ICD-10-CM | POA: Diagnosis not present

## 2017-05-20 DIAGNOSIS — Z7401 Bed confinement status: Secondary | ICD-10-CM | POA: Diagnosis not present

## 2017-06-23 DIAGNOSIS — I10 Essential (primary) hypertension: Secondary | ICD-10-CM | POA: Diagnosis not present

## 2017-06-23 DIAGNOSIS — G6181 Chronic inflammatory demyelinating polyneuritis: Secondary | ICD-10-CM | POA: Diagnosis not present

## 2017-06-23 DIAGNOSIS — E785 Hyperlipidemia, unspecified: Secondary | ICD-10-CM | POA: Diagnosis not present

## 2017-06-23 DIAGNOSIS — R32 Unspecified urinary incontinence: Secondary | ICD-10-CM | POA: Diagnosis not present

## 2017-06-23 DIAGNOSIS — G35 Multiple sclerosis: Secondary | ICD-10-CM | POA: Diagnosis not present

## 2017-06-23 DIAGNOSIS — M5430 Sciatica, unspecified side: Secondary | ICD-10-CM | POA: Diagnosis not present

## 2017-06-23 DIAGNOSIS — S82309A Unspecified fracture of lower end of unspecified tibia, initial encounter for closed fracture: Secondary | ICD-10-CM | POA: Diagnosis not present

## 2017-06-23 DIAGNOSIS — Z7401 Bed confinement status: Secondary | ICD-10-CM | POA: Diagnosis not present

## 2017-07-08 DIAGNOSIS — E669 Obesity, unspecified: Secondary | ICD-10-CM | POA: Diagnosis not present

## 2017-07-08 DIAGNOSIS — R600 Localized edema: Secondary | ICD-10-CM | POA: Diagnosis not present

## 2017-07-08 DIAGNOSIS — M1990 Unspecified osteoarthritis, unspecified site: Secondary | ICD-10-CM | POA: Diagnosis not present

## 2017-07-08 DIAGNOSIS — G35 Multiple sclerosis: Secondary | ICD-10-CM | POA: Diagnosis not present

## 2017-07-08 DIAGNOSIS — Z993 Dependence on wheelchair: Secondary | ICD-10-CM | POA: Diagnosis not present

## 2017-07-08 DIAGNOSIS — G629 Polyneuropathy, unspecified: Secondary | ICD-10-CM | POA: Diagnosis not present

## 2017-07-08 DIAGNOSIS — M62838 Other muscle spasm: Secondary | ICD-10-CM | POA: Diagnosis not present

## 2017-07-08 DIAGNOSIS — M545 Low back pain: Secondary | ICD-10-CM | POA: Diagnosis not present

## 2017-07-23 DIAGNOSIS — G35 Multiple sclerosis: Secondary | ICD-10-CM | POA: Diagnosis not present

## 2017-07-23 DIAGNOSIS — R32 Unspecified urinary incontinence: Secondary | ICD-10-CM | POA: Diagnosis not present

## 2017-07-23 DIAGNOSIS — S82309A Unspecified fracture of lower end of unspecified tibia, initial encounter for closed fracture: Secondary | ICD-10-CM | POA: Diagnosis not present

## 2017-07-23 DIAGNOSIS — Z7401 Bed confinement status: Secondary | ICD-10-CM | POA: Diagnosis not present

## 2017-07-23 DIAGNOSIS — M5430 Sciatica, unspecified side: Secondary | ICD-10-CM | POA: Diagnosis not present

## 2017-08-23 DIAGNOSIS — Z7401 Bed confinement status: Secondary | ICD-10-CM | POA: Diagnosis not present

## 2017-08-23 DIAGNOSIS — M5430 Sciatica, unspecified side: Secondary | ICD-10-CM | POA: Diagnosis not present

## 2017-08-23 DIAGNOSIS — R32 Unspecified urinary incontinence: Secondary | ICD-10-CM | POA: Diagnosis not present

## 2017-08-23 DIAGNOSIS — G35 Multiple sclerosis: Secondary | ICD-10-CM | POA: Diagnosis not present

## 2017-08-23 DIAGNOSIS — S82309A Unspecified fracture of lower end of unspecified tibia, initial encounter for closed fracture: Secondary | ICD-10-CM | POA: Diagnosis not present

## 2017-09-22 DIAGNOSIS — S82309A Unspecified fracture of lower end of unspecified tibia, initial encounter for closed fracture: Secondary | ICD-10-CM | POA: Diagnosis not present

## 2017-09-22 DIAGNOSIS — R32 Unspecified urinary incontinence: Secondary | ICD-10-CM | POA: Diagnosis not present

## 2017-09-22 DIAGNOSIS — G35 Multiple sclerosis: Secondary | ICD-10-CM | POA: Diagnosis not present

## 2017-09-22 DIAGNOSIS — M5430 Sciatica, unspecified side: Secondary | ICD-10-CM | POA: Diagnosis not present

## 2017-09-22 DIAGNOSIS — Z7401 Bed confinement status: Secondary | ICD-10-CM | POA: Diagnosis not present

## 2017-10-06 DIAGNOSIS — G479 Sleep disorder, unspecified: Secondary | ICD-10-CM | POA: Diagnosis not present

## 2017-10-06 DIAGNOSIS — G35 Multiple sclerosis: Secondary | ICD-10-CM | POA: Diagnosis not present

## 2017-10-06 DIAGNOSIS — R030 Elevated blood-pressure reading, without diagnosis of hypertension: Secondary | ICD-10-CM | POA: Diagnosis not present

## 2017-10-06 DIAGNOSIS — M62830 Muscle spasm of back: Secondary | ICD-10-CM | POA: Diagnosis not present

## 2017-10-10 DIAGNOSIS — G35 Multiple sclerosis: Secondary | ICD-10-CM | POA: Diagnosis not present

## 2017-10-10 DIAGNOSIS — G478 Other sleep disorders: Secondary | ICD-10-CM | POA: Diagnosis not present

## 2017-10-10 DIAGNOSIS — G8220 Paraplegia, unspecified: Secondary | ICD-10-CM | POA: Diagnosis not present

## 2017-10-10 DIAGNOSIS — Z79899 Other long term (current) drug therapy: Secondary | ICD-10-CM | POA: Diagnosis not present

## 2017-10-10 DIAGNOSIS — E663 Overweight: Secondary | ICD-10-CM | POA: Diagnosis not present

## 2017-10-10 DIAGNOSIS — R32 Unspecified urinary incontinence: Secondary | ICD-10-CM | POA: Diagnosis not present

## 2017-10-10 DIAGNOSIS — M5430 Sciatica, unspecified side: Secondary | ICD-10-CM | POA: Diagnosis not present

## 2017-10-23 DIAGNOSIS — G35 Multiple sclerosis: Secondary | ICD-10-CM | POA: Diagnosis not present

## 2017-10-23 DIAGNOSIS — M5430 Sciatica, unspecified side: Secondary | ICD-10-CM | POA: Diagnosis not present

## 2017-10-23 DIAGNOSIS — R32 Unspecified urinary incontinence: Secondary | ICD-10-CM | POA: Diagnosis not present

## 2017-10-23 DIAGNOSIS — Z7401 Bed confinement status: Secondary | ICD-10-CM | POA: Diagnosis not present

## 2017-10-23 DIAGNOSIS — S82309A Unspecified fracture of lower end of unspecified tibia, initial encounter for closed fracture: Secondary | ICD-10-CM | POA: Diagnosis not present

## 2017-11-23 DIAGNOSIS — G35 Multiple sclerosis: Secondary | ICD-10-CM | POA: Diagnosis not present

## 2017-11-23 DIAGNOSIS — R32 Unspecified urinary incontinence: Secondary | ICD-10-CM | POA: Diagnosis not present

## 2017-11-23 DIAGNOSIS — Z7401 Bed confinement status: Secondary | ICD-10-CM | POA: Diagnosis not present

## 2017-11-23 DIAGNOSIS — S82309A Unspecified fracture of lower end of unspecified tibia, initial encounter for closed fracture: Secondary | ICD-10-CM | POA: Diagnosis not present

## 2017-11-23 DIAGNOSIS — M5430 Sciatica, unspecified side: Secondary | ICD-10-CM | POA: Diagnosis not present

## 2017-12-23 DIAGNOSIS — R32 Unspecified urinary incontinence: Secondary | ICD-10-CM | POA: Diagnosis not present

## 2017-12-23 DIAGNOSIS — G35 Multiple sclerosis: Secondary | ICD-10-CM | POA: Diagnosis not present

## 2017-12-23 DIAGNOSIS — S82309A Unspecified fracture of lower end of unspecified tibia, initial encounter for closed fracture: Secondary | ICD-10-CM | POA: Diagnosis not present

## 2017-12-23 DIAGNOSIS — Z7401 Bed confinement status: Secondary | ICD-10-CM | POA: Diagnosis not present

## 2017-12-23 DIAGNOSIS — M5430 Sciatica, unspecified side: Secondary | ICD-10-CM | POA: Diagnosis not present

## 2018-01-23 DIAGNOSIS — S82309A Unspecified fracture of lower end of unspecified tibia, initial encounter for closed fracture: Secondary | ICD-10-CM | POA: Diagnosis not present

## 2018-01-23 DIAGNOSIS — M5430 Sciatica, unspecified side: Secondary | ICD-10-CM | POA: Diagnosis not present

## 2018-01-23 DIAGNOSIS — R32 Unspecified urinary incontinence: Secondary | ICD-10-CM | POA: Diagnosis not present

## 2018-01-23 DIAGNOSIS — G35 Multiple sclerosis: Secondary | ICD-10-CM | POA: Diagnosis not present

## 2018-01-23 DIAGNOSIS — Z7401 Bed confinement status: Secondary | ICD-10-CM | POA: Diagnosis not present

## 2018-02-04 DIAGNOSIS — R030 Elevated blood-pressure reading, without diagnosis of hypertension: Secondary | ICD-10-CM | POA: Diagnosis not present

## 2018-02-04 DIAGNOSIS — Z23 Encounter for immunization: Secondary | ICD-10-CM | POA: Diagnosis not present

## 2018-02-04 DIAGNOSIS — L89899 Pressure ulcer of other site, unspecified stage: Secondary | ICD-10-CM | POA: Diagnosis not present

## 2018-02-04 DIAGNOSIS — G35 Multiple sclerosis: Secondary | ICD-10-CM | POA: Diagnosis not present

## 2018-02-07 DIAGNOSIS — R32 Unspecified urinary incontinence: Secondary | ICD-10-CM | POA: Diagnosis not present

## 2018-02-07 DIAGNOSIS — G35 Multiple sclerosis: Secondary | ICD-10-CM | POA: Diagnosis not present

## 2018-02-07 DIAGNOSIS — M5430 Sciatica, unspecified side: Secondary | ICD-10-CM | POA: Diagnosis not present

## 2018-02-07 DIAGNOSIS — R1310 Dysphagia, unspecified: Secondary | ICD-10-CM | POA: Diagnosis not present

## 2018-02-07 DIAGNOSIS — L89322 Pressure ulcer of left buttock, stage 2: Secondary | ICD-10-CM | POA: Diagnosis not present

## 2018-02-07 DIAGNOSIS — R030 Elevated blood-pressure reading, without diagnosis of hypertension: Secondary | ICD-10-CM | POA: Diagnosis not present

## 2018-02-11 DIAGNOSIS — R32 Unspecified urinary incontinence: Secondary | ICD-10-CM | POA: Diagnosis not present

## 2018-02-11 DIAGNOSIS — M5430 Sciatica, unspecified side: Secondary | ICD-10-CM | POA: Diagnosis not present

## 2018-02-11 DIAGNOSIS — L89322 Pressure ulcer of left buttock, stage 2: Secondary | ICD-10-CM | POA: Diagnosis not present

## 2018-02-11 DIAGNOSIS — R1310 Dysphagia, unspecified: Secondary | ICD-10-CM | POA: Diagnosis not present

## 2018-02-11 DIAGNOSIS — G35 Multiple sclerosis: Secondary | ICD-10-CM | POA: Diagnosis not present

## 2018-02-11 DIAGNOSIS — R030 Elevated blood-pressure reading, without diagnosis of hypertension: Secondary | ICD-10-CM | POA: Diagnosis not present

## 2018-02-12 DIAGNOSIS — Z79899 Other long term (current) drug therapy: Secondary | ICD-10-CM | POA: Diagnosis not present

## 2018-02-13 DIAGNOSIS — M5430 Sciatica, unspecified side: Secondary | ICD-10-CM | POA: Diagnosis not present

## 2018-02-13 DIAGNOSIS — L89322 Pressure ulcer of left buttock, stage 2: Secondary | ICD-10-CM | POA: Diagnosis not present

## 2018-02-13 DIAGNOSIS — R32 Unspecified urinary incontinence: Secondary | ICD-10-CM | POA: Diagnosis not present

## 2018-02-13 DIAGNOSIS — G35 Multiple sclerosis: Secondary | ICD-10-CM | POA: Diagnosis not present

## 2018-02-13 DIAGNOSIS — R1310 Dysphagia, unspecified: Secondary | ICD-10-CM | POA: Diagnosis not present

## 2018-02-13 DIAGNOSIS — R030 Elevated blood-pressure reading, without diagnosis of hypertension: Secondary | ICD-10-CM | POA: Diagnosis not present

## 2018-02-17 DIAGNOSIS — L89322 Pressure ulcer of left buttock, stage 2: Secondary | ICD-10-CM | POA: Diagnosis not present

## 2018-02-17 DIAGNOSIS — R1310 Dysphagia, unspecified: Secondary | ICD-10-CM | POA: Diagnosis not present

## 2018-02-17 DIAGNOSIS — M5430 Sciatica, unspecified side: Secondary | ICD-10-CM | POA: Diagnosis not present

## 2018-02-17 DIAGNOSIS — R32 Unspecified urinary incontinence: Secondary | ICD-10-CM | POA: Diagnosis not present

## 2018-02-17 DIAGNOSIS — G35 Multiple sclerosis: Secondary | ICD-10-CM | POA: Diagnosis not present

## 2018-02-17 DIAGNOSIS — R030 Elevated blood-pressure reading, without diagnosis of hypertension: Secondary | ICD-10-CM | POA: Diagnosis not present

## 2018-02-20 DIAGNOSIS — L89322 Pressure ulcer of left buttock, stage 2: Secondary | ICD-10-CM | POA: Diagnosis not present

## 2018-02-20 DIAGNOSIS — R030 Elevated blood-pressure reading, without diagnosis of hypertension: Secondary | ICD-10-CM | POA: Diagnosis not present

## 2018-02-20 DIAGNOSIS — R32 Unspecified urinary incontinence: Secondary | ICD-10-CM | POA: Diagnosis not present

## 2018-02-20 DIAGNOSIS — M5430 Sciatica, unspecified side: Secondary | ICD-10-CM | POA: Diagnosis not present

## 2018-02-20 DIAGNOSIS — R1310 Dysphagia, unspecified: Secondary | ICD-10-CM | POA: Diagnosis not present

## 2018-02-20 DIAGNOSIS — G35 Multiple sclerosis: Secondary | ICD-10-CM | POA: Diagnosis not present

## 2018-02-22 DIAGNOSIS — Z7401 Bed confinement status: Secondary | ICD-10-CM | POA: Diagnosis not present

## 2018-02-22 DIAGNOSIS — M5430 Sciatica, unspecified side: Secondary | ICD-10-CM | POA: Diagnosis not present

## 2018-02-22 DIAGNOSIS — R32 Unspecified urinary incontinence: Secondary | ICD-10-CM | POA: Diagnosis not present

## 2018-02-22 DIAGNOSIS — G35 Multiple sclerosis: Secondary | ICD-10-CM | POA: Diagnosis not present

## 2018-02-22 DIAGNOSIS — S82309A Unspecified fracture of lower end of unspecified tibia, initial encounter for closed fracture: Secondary | ICD-10-CM | POA: Diagnosis not present

## 2018-02-24 DIAGNOSIS — L89322 Pressure ulcer of left buttock, stage 2: Secondary | ICD-10-CM | POA: Diagnosis not present

## 2018-02-24 DIAGNOSIS — M5430 Sciatica, unspecified side: Secondary | ICD-10-CM | POA: Diagnosis not present

## 2018-02-24 DIAGNOSIS — R030 Elevated blood-pressure reading, without diagnosis of hypertension: Secondary | ICD-10-CM | POA: Diagnosis not present

## 2018-02-24 DIAGNOSIS — R32 Unspecified urinary incontinence: Secondary | ICD-10-CM | POA: Diagnosis not present

## 2018-02-24 DIAGNOSIS — R1310 Dysphagia, unspecified: Secondary | ICD-10-CM | POA: Diagnosis not present

## 2018-02-24 DIAGNOSIS — G35 Multiple sclerosis: Secondary | ICD-10-CM | POA: Diagnosis not present

## 2018-02-27 DIAGNOSIS — R1310 Dysphagia, unspecified: Secondary | ICD-10-CM | POA: Diagnosis not present

## 2018-02-27 DIAGNOSIS — R32 Unspecified urinary incontinence: Secondary | ICD-10-CM | POA: Diagnosis not present

## 2018-02-27 DIAGNOSIS — L89322 Pressure ulcer of left buttock, stage 2: Secondary | ICD-10-CM | POA: Diagnosis not present

## 2018-02-27 DIAGNOSIS — G35 Multiple sclerosis: Secondary | ICD-10-CM | POA: Diagnosis not present

## 2018-02-27 DIAGNOSIS — M5430 Sciatica, unspecified side: Secondary | ICD-10-CM | POA: Diagnosis not present

## 2018-02-27 DIAGNOSIS — R030 Elevated blood-pressure reading, without diagnosis of hypertension: Secondary | ICD-10-CM | POA: Diagnosis not present

## 2018-03-03 DIAGNOSIS — R32 Unspecified urinary incontinence: Secondary | ICD-10-CM | POA: Diagnosis not present

## 2018-03-03 DIAGNOSIS — R1310 Dysphagia, unspecified: Secondary | ICD-10-CM | POA: Diagnosis not present

## 2018-03-03 DIAGNOSIS — R030 Elevated blood-pressure reading, without diagnosis of hypertension: Secondary | ICD-10-CM | POA: Diagnosis not present

## 2018-03-03 DIAGNOSIS — G35 Multiple sclerosis: Secondary | ICD-10-CM | POA: Diagnosis not present

## 2018-03-03 DIAGNOSIS — L89322 Pressure ulcer of left buttock, stage 2: Secondary | ICD-10-CM | POA: Diagnosis not present

## 2018-03-03 DIAGNOSIS — M5430 Sciatica, unspecified side: Secondary | ICD-10-CM | POA: Diagnosis not present

## 2018-03-07 DIAGNOSIS — M5430 Sciatica, unspecified side: Secondary | ICD-10-CM | POA: Diagnosis not present

## 2018-03-07 DIAGNOSIS — R1310 Dysphagia, unspecified: Secondary | ICD-10-CM | POA: Diagnosis not present

## 2018-03-07 DIAGNOSIS — R32 Unspecified urinary incontinence: Secondary | ICD-10-CM | POA: Diagnosis not present

## 2018-03-07 DIAGNOSIS — G35 Multiple sclerosis: Secondary | ICD-10-CM | POA: Diagnosis not present

## 2018-03-07 DIAGNOSIS — R030 Elevated blood-pressure reading, without diagnosis of hypertension: Secondary | ICD-10-CM | POA: Diagnosis not present

## 2018-03-07 DIAGNOSIS — L89322 Pressure ulcer of left buttock, stage 2: Secondary | ICD-10-CM | POA: Diagnosis not present

## 2018-03-10 DIAGNOSIS — R32 Unspecified urinary incontinence: Secondary | ICD-10-CM | POA: Diagnosis not present

## 2018-03-10 DIAGNOSIS — R030 Elevated blood-pressure reading, without diagnosis of hypertension: Secondary | ICD-10-CM | POA: Diagnosis not present

## 2018-03-10 DIAGNOSIS — L89322 Pressure ulcer of left buttock, stage 2: Secondary | ICD-10-CM | POA: Diagnosis not present

## 2018-03-10 DIAGNOSIS — R1310 Dysphagia, unspecified: Secondary | ICD-10-CM | POA: Diagnosis not present

## 2018-03-10 DIAGNOSIS — G35 Multiple sclerosis: Secondary | ICD-10-CM | POA: Diagnosis not present

## 2018-03-10 DIAGNOSIS — M5430 Sciatica, unspecified side: Secondary | ICD-10-CM | POA: Diagnosis not present

## 2018-03-13 DIAGNOSIS — R32 Unspecified urinary incontinence: Secondary | ICD-10-CM | POA: Diagnosis not present

## 2018-03-13 DIAGNOSIS — M5430 Sciatica, unspecified side: Secondary | ICD-10-CM | POA: Diagnosis not present

## 2018-03-13 DIAGNOSIS — G35 Multiple sclerosis: Secondary | ICD-10-CM | POA: Diagnosis not present

## 2018-03-13 DIAGNOSIS — R1310 Dysphagia, unspecified: Secondary | ICD-10-CM | POA: Diagnosis not present

## 2018-03-13 DIAGNOSIS — R030 Elevated blood-pressure reading, without diagnosis of hypertension: Secondary | ICD-10-CM | POA: Diagnosis not present

## 2018-03-13 DIAGNOSIS — L89322 Pressure ulcer of left buttock, stage 2: Secondary | ICD-10-CM | POA: Diagnosis not present

## 2018-03-16 DIAGNOSIS — M5430 Sciatica, unspecified side: Secondary | ICD-10-CM | POA: Diagnosis not present

## 2018-03-16 DIAGNOSIS — R1310 Dysphagia, unspecified: Secondary | ICD-10-CM | POA: Diagnosis not present

## 2018-03-16 DIAGNOSIS — R32 Unspecified urinary incontinence: Secondary | ICD-10-CM | POA: Diagnosis not present

## 2018-03-16 DIAGNOSIS — L89322 Pressure ulcer of left buttock, stage 2: Secondary | ICD-10-CM | POA: Diagnosis not present

## 2018-03-16 DIAGNOSIS — R030 Elevated blood-pressure reading, without diagnosis of hypertension: Secondary | ICD-10-CM | POA: Diagnosis not present

## 2018-03-16 DIAGNOSIS — G35 Multiple sclerosis: Secondary | ICD-10-CM | POA: Diagnosis not present

## 2018-03-25 DIAGNOSIS — Z7401 Bed confinement status: Secondary | ICD-10-CM | POA: Diagnosis not present

## 2018-03-25 DIAGNOSIS — M5430 Sciatica, unspecified side: Secondary | ICD-10-CM | POA: Diagnosis not present

## 2018-03-25 DIAGNOSIS — S82309A Unspecified fracture of lower end of unspecified tibia, initial encounter for closed fracture: Secondary | ICD-10-CM | POA: Diagnosis not present

## 2018-03-25 DIAGNOSIS — G35 Multiple sclerosis: Secondary | ICD-10-CM | POA: Diagnosis not present

## 2018-03-25 DIAGNOSIS — R32 Unspecified urinary incontinence: Secondary | ICD-10-CM | POA: Diagnosis not present

## 2018-04-25 DIAGNOSIS — Z7401 Bed confinement status: Secondary | ICD-10-CM | POA: Diagnosis not present

## 2018-04-25 DIAGNOSIS — S82309A Unspecified fracture of lower end of unspecified tibia, initial encounter for closed fracture: Secondary | ICD-10-CM | POA: Diagnosis not present

## 2018-04-25 DIAGNOSIS — M5430 Sciatica, unspecified side: Secondary | ICD-10-CM | POA: Diagnosis not present

## 2018-04-25 DIAGNOSIS — G35 Multiple sclerosis: Secondary | ICD-10-CM | POA: Diagnosis not present

## 2018-04-25 DIAGNOSIS — R32 Unspecified urinary incontinence: Secondary | ICD-10-CM | POA: Diagnosis not present

## 2018-05-24 DIAGNOSIS — M5430 Sciatica, unspecified side: Secondary | ICD-10-CM | POA: Diagnosis not present

## 2018-05-24 DIAGNOSIS — G35 Multiple sclerosis: Secondary | ICD-10-CM | POA: Diagnosis not present

## 2018-05-24 DIAGNOSIS — S82309A Unspecified fracture of lower end of unspecified tibia, initial encounter for closed fracture: Secondary | ICD-10-CM | POA: Diagnosis not present

## 2018-05-24 DIAGNOSIS — Z741 Need for assistance with personal care: Secondary | ICD-10-CM | POA: Diagnosis not present

## 2018-06-11 DIAGNOSIS — R109 Unspecified abdominal pain: Secondary | ICD-10-CM | POA: Diagnosis not present

## 2018-06-12 DIAGNOSIS — R030 Elevated blood-pressure reading, without diagnosis of hypertension: Secondary | ICD-10-CM | POA: Diagnosis not present

## 2018-06-12 DIAGNOSIS — L89899 Pressure ulcer of other site, unspecified stage: Secondary | ICD-10-CM | POA: Diagnosis not present

## 2018-06-12 DIAGNOSIS — R109 Unspecified abdominal pain: Secondary | ICD-10-CM | POA: Diagnosis not present

## 2018-06-12 DIAGNOSIS — G35 Multiple sclerosis: Secondary | ICD-10-CM | POA: Diagnosis not present

## 2018-06-12 DIAGNOSIS — K5901 Slow transit constipation: Secondary | ICD-10-CM | POA: Diagnosis not present

## 2018-06-12 DIAGNOSIS — Z79899 Other long term (current) drug therapy: Secondary | ICD-10-CM | POA: Diagnosis not present

## 2018-06-24 DIAGNOSIS — Z741 Need for assistance with personal care: Secondary | ICD-10-CM | POA: Diagnosis not present

## 2018-06-24 DIAGNOSIS — S82309A Unspecified fracture of lower end of unspecified tibia, initial encounter for closed fracture: Secondary | ICD-10-CM | POA: Diagnosis not present

## 2018-06-24 DIAGNOSIS — G35 Multiple sclerosis: Secondary | ICD-10-CM | POA: Diagnosis not present

## 2018-06-24 DIAGNOSIS — M5430 Sciatica, unspecified side: Secondary | ICD-10-CM | POA: Diagnosis not present

## 2018-07-16 DIAGNOSIS — Z79899 Other long term (current) drug therapy: Secondary | ICD-10-CM | POA: Diagnosis not present

## 2018-07-18 DIAGNOSIS — M62830 Muscle spasm of back: Secondary | ICD-10-CM | POA: Diagnosis not present

## 2018-07-18 DIAGNOSIS — S71101D Unspecified open wound, right thigh, subsequent encounter: Secondary | ICD-10-CM | POA: Diagnosis not present

## 2018-07-18 DIAGNOSIS — L89323 Pressure ulcer of left buttock, stage 3: Secondary | ICD-10-CM | POA: Diagnosis not present

## 2018-07-18 DIAGNOSIS — Z7401 Bed confinement status: Secondary | ICD-10-CM | POA: Diagnosis not present

## 2018-07-18 DIAGNOSIS — G35 Multiple sclerosis: Secondary | ICD-10-CM | POA: Diagnosis not present

## 2018-07-18 DIAGNOSIS — R030 Elevated blood-pressure reading, without diagnosis of hypertension: Secondary | ICD-10-CM | POA: Diagnosis not present

## 2018-07-18 DIAGNOSIS — R1312 Dysphagia, oropharyngeal phase: Secondary | ICD-10-CM | POA: Diagnosis not present

## 2018-07-18 DIAGNOSIS — F419 Anxiety disorder, unspecified: Secondary | ICD-10-CM | POA: Diagnosis not present

## 2018-07-18 DIAGNOSIS — K5901 Slow transit constipation: Secondary | ICD-10-CM | POA: Diagnosis not present

## 2018-07-20 DIAGNOSIS — S71101D Unspecified open wound, right thigh, subsequent encounter: Secondary | ICD-10-CM | POA: Diagnosis not present

## 2018-07-20 DIAGNOSIS — L89323 Pressure ulcer of left buttock, stage 3: Secondary | ICD-10-CM | POA: Diagnosis not present

## 2018-07-20 DIAGNOSIS — F419 Anxiety disorder, unspecified: Secondary | ICD-10-CM | POA: Diagnosis not present

## 2018-07-20 DIAGNOSIS — R030 Elevated blood-pressure reading, without diagnosis of hypertension: Secondary | ICD-10-CM | POA: Diagnosis not present

## 2018-07-20 DIAGNOSIS — M62830 Muscle spasm of back: Secondary | ICD-10-CM | POA: Diagnosis not present

## 2018-07-20 DIAGNOSIS — K5901 Slow transit constipation: Secondary | ICD-10-CM | POA: Diagnosis not present

## 2018-07-20 DIAGNOSIS — R1312 Dysphagia, oropharyngeal phase: Secondary | ICD-10-CM | POA: Diagnosis not present

## 2018-07-20 DIAGNOSIS — Z7401 Bed confinement status: Secondary | ICD-10-CM | POA: Diagnosis not present

## 2018-07-20 DIAGNOSIS — G35 Multiple sclerosis: Secondary | ICD-10-CM | POA: Diagnosis not present

## 2018-07-22 DIAGNOSIS — K5901 Slow transit constipation: Secondary | ICD-10-CM | POA: Diagnosis not present

## 2018-07-22 DIAGNOSIS — R1312 Dysphagia, oropharyngeal phase: Secondary | ICD-10-CM | POA: Diagnosis not present

## 2018-07-22 DIAGNOSIS — L89323 Pressure ulcer of left buttock, stage 3: Secondary | ICD-10-CM | POA: Diagnosis not present

## 2018-07-22 DIAGNOSIS — Z7401 Bed confinement status: Secondary | ICD-10-CM | POA: Diagnosis not present

## 2018-07-22 DIAGNOSIS — M62830 Muscle spasm of back: Secondary | ICD-10-CM | POA: Diagnosis not present

## 2018-07-22 DIAGNOSIS — G35 Multiple sclerosis: Secondary | ICD-10-CM | POA: Diagnosis not present

## 2018-07-22 DIAGNOSIS — F419 Anxiety disorder, unspecified: Secondary | ICD-10-CM | POA: Diagnosis not present

## 2018-07-22 DIAGNOSIS — S71101D Unspecified open wound, right thigh, subsequent encounter: Secondary | ICD-10-CM | POA: Diagnosis not present

## 2018-07-22 DIAGNOSIS — R030 Elevated blood-pressure reading, without diagnosis of hypertension: Secondary | ICD-10-CM | POA: Diagnosis not present

## 2018-07-24 DIAGNOSIS — M62830 Muscle spasm of back: Secondary | ICD-10-CM | POA: Diagnosis not present

## 2018-07-24 DIAGNOSIS — R1312 Dysphagia, oropharyngeal phase: Secondary | ICD-10-CM | POA: Diagnosis not present

## 2018-07-24 DIAGNOSIS — L89323 Pressure ulcer of left buttock, stage 3: Secondary | ICD-10-CM | POA: Diagnosis not present

## 2018-07-24 DIAGNOSIS — Z7401 Bed confinement status: Secondary | ICD-10-CM | POA: Diagnosis not present

## 2018-07-24 DIAGNOSIS — G35 Multiple sclerosis: Secondary | ICD-10-CM | POA: Diagnosis not present

## 2018-07-24 DIAGNOSIS — F419 Anxiety disorder, unspecified: Secondary | ICD-10-CM | POA: Diagnosis not present

## 2018-07-24 DIAGNOSIS — S71101D Unspecified open wound, right thigh, subsequent encounter: Secondary | ICD-10-CM | POA: Diagnosis not present

## 2018-07-24 DIAGNOSIS — R030 Elevated blood-pressure reading, without diagnosis of hypertension: Secondary | ICD-10-CM | POA: Diagnosis not present

## 2018-07-24 DIAGNOSIS — K5901 Slow transit constipation: Secondary | ICD-10-CM | POA: Diagnosis not present

## 2018-07-27 DIAGNOSIS — R1312 Dysphagia, oropharyngeal phase: Secondary | ICD-10-CM | POA: Diagnosis not present

## 2018-07-27 DIAGNOSIS — M62830 Muscle spasm of back: Secondary | ICD-10-CM | POA: Diagnosis not present

## 2018-07-27 DIAGNOSIS — R030 Elevated blood-pressure reading, without diagnosis of hypertension: Secondary | ICD-10-CM | POA: Diagnosis not present

## 2018-07-27 DIAGNOSIS — K5901 Slow transit constipation: Secondary | ICD-10-CM | POA: Diagnosis not present

## 2018-07-27 DIAGNOSIS — G35 Multiple sclerosis: Secondary | ICD-10-CM | POA: Diagnosis not present

## 2018-07-27 DIAGNOSIS — S71101D Unspecified open wound, right thigh, subsequent encounter: Secondary | ICD-10-CM | POA: Diagnosis not present

## 2018-07-27 DIAGNOSIS — L89323 Pressure ulcer of left buttock, stage 3: Secondary | ICD-10-CM | POA: Diagnosis not present

## 2018-07-27 DIAGNOSIS — Z7401 Bed confinement status: Secondary | ICD-10-CM | POA: Diagnosis not present

## 2018-07-27 DIAGNOSIS — F419 Anxiety disorder, unspecified: Secondary | ICD-10-CM | POA: Diagnosis not present

## 2018-07-29 DIAGNOSIS — M62830 Muscle spasm of back: Secondary | ICD-10-CM | POA: Diagnosis not present

## 2018-07-29 DIAGNOSIS — S71101D Unspecified open wound, right thigh, subsequent encounter: Secondary | ICD-10-CM | POA: Diagnosis not present

## 2018-07-29 DIAGNOSIS — Z7401 Bed confinement status: Secondary | ICD-10-CM | POA: Diagnosis not present

## 2018-07-29 DIAGNOSIS — F419 Anxiety disorder, unspecified: Secondary | ICD-10-CM | POA: Diagnosis not present

## 2018-07-29 DIAGNOSIS — R030 Elevated blood-pressure reading, without diagnosis of hypertension: Secondary | ICD-10-CM | POA: Diagnosis not present

## 2018-07-29 DIAGNOSIS — L89323 Pressure ulcer of left buttock, stage 3: Secondary | ICD-10-CM | POA: Diagnosis not present

## 2018-07-29 DIAGNOSIS — K5901 Slow transit constipation: Secondary | ICD-10-CM | POA: Diagnosis not present

## 2018-07-29 DIAGNOSIS — G35 Multiple sclerosis: Secondary | ICD-10-CM | POA: Diagnosis not present

## 2018-07-29 DIAGNOSIS — R1312 Dysphagia, oropharyngeal phase: Secondary | ICD-10-CM | POA: Diagnosis not present

## 2018-07-31 DIAGNOSIS — Z7401 Bed confinement status: Secondary | ICD-10-CM | POA: Diagnosis not present

## 2018-07-31 DIAGNOSIS — S71101D Unspecified open wound, right thigh, subsequent encounter: Secondary | ICD-10-CM | POA: Diagnosis not present

## 2018-07-31 DIAGNOSIS — F419 Anxiety disorder, unspecified: Secondary | ICD-10-CM | POA: Diagnosis not present

## 2018-07-31 DIAGNOSIS — K5901 Slow transit constipation: Secondary | ICD-10-CM | POA: Diagnosis not present

## 2018-07-31 DIAGNOSIS — R030 Elevated blood-pressure reading, without diagnosis of hypertension: Secondary | ICD-10-CM | POA: Diagnosis not present

## 2018-07-31 DIAGNOSIS — R1312 Dysphagia, oropharyngeal phase: Secondary | ICD-10-CM | POA: Diagnosis not present

## 2018-07-31 DIAGNOSIS — M62830 Muscle spasm of back: Secondary | ICD-10-CM | POA: Diagnosis not present

## 2018-07-31 DIAGNOSIS — L89323 Pressure ulcer of left buttock, stage 3: Secondary | ICD-10-CM | POA: Diagnosis not present

## 2018-07-31 DIAGNOSIS — G35 Multiple sclerosis: Secondary | ICD-10-CM | POA: Diagnosis not present

## 2018-08-03 DIAGNOSIS — S71101D Unspecified open wound, right thigh, subsequent encounter: Secondary | ICD-10-CM | POA: Diagnosis not present

## 2018-08-03 DIAGNOSIS — F419 Anxiety disorder, unspecified: Secondary | ICD-10-CM | POA: Diagnosis not present

## 2018-08-03 DIAGNOSIS — R030 Elevated blood-pressure reading, without diagnosis of hypertension: Secondary | ICD-10-CM | POA: Diagnosis not present

## 2018-08-03 DIAGNOSIS — K5901 Slow transit constipation: Secondary | ICD-10-CM | POA: Diagnosis not present

## 2018-08-03 DIAGNOSIS — M62830 Muscle spasm of back: Secondary | ICD-10-CM | POA: Diagnosis not present

## 2018-08-03 DIAGNOSIS — Z7401 Bed confinement status: Secondary | ICD-10-CM | POA: Diagnosis not present

## 2018-08-03 DIAGNOSIS — R1312 Dysphagia, oropharyngeal phase: Secondary | ICD-10-CM | POA: Diagnosis not present

## 2018-08-03 DIAGNOSIS — G35 Multiple sclerosis: Secondary | ICD-10-CM | POA: Diagnosis not present

## 2018-08-03 DIAGNOSIS — L89323 Pressure ulcer of left buttock, stage 3: Secondary | ICD-10-CM | POA: Diagnosis not present

## 2018-08-04 DIAGNOSIS — F419 Anxiety disorder, unspecified: Secondary | ICD-10-CM | POA: Diagnosis not present

## 2018-08-04 DIAGNOSIS — K5901 Slow transit constipation: Secondary | ICD-10-CM | POA: Diagnosis not present

## 2018-08-04 DIAGNOSIS — R030 Elevated blood-pressure reading, without diagnosis of hypertension: Secondary | ICD-10-CM | POA: Diagnosis not present

## 2018-08-04 DIAGNOSIS — L89323 Pressure ulcer of left buttock, stage 3: Secondary | ICD-10-CM | POA: Diagnosis not present

## 2018-08-04 DIAGNOSIS — S71101D Unspecified open wound, right thigh, subsequent encounter: Secondary | ICD-10-CM | POA: Diagnosis not present

## 2018-08-04 DIAGNOSIS — R1312 Dysphagia, oropharyngeal phase: Secondary | ICD-10-CM | POA: Diagnosis not present

## 2018-08-04 DIAGNOSIS — Z7401 Bed confinement status: Secondary | ICD-10-CM | POA: Diagnosis not present

## 2018-08-04 DIAGNOSIS — G35 Multiple sclerosis: Secondary | ICD-10-CM | POA: Diagnosis not present

## 2018-08-04 DIAGNOSIS — M62830 Muscle spasm of back: Secondary | ICD-10-CM | POA: Diagnosis not present

## 2018-08-06 DIAGNOSIS — S71101D Unspecified open wound, right thigh, subsequent encounter: Secondary | ICD-10-CM | POA: Diagnosis not present

## 2018-08-06 DIAGNOSIS — M62830 Muscle spasm of back: Secondary | ICD-10-CM | POA: Diagnosis not present

## 2018-08-06 DIAGNOSIS — Z7401 Bed confinement status: Secondary | ICD-10-CM | POA: Diagnosis not present

## 2018-08-06 DIAGNOSIS — R1312 Dysphagia, oropharyngeal phase: Secondary | ICD-10-CM | POA: Diagnosis not present

## 2018-08-06 DIAGNOSIS — G35 Multiple sclerosis: Secondary | ICD-10-CM | POA: Diagnosis not present

## 2018-08-06 DIAGNOSIS — K5901 Slow transit constipation: Secondary | ICD-10-CM | POA: Diagnosis not present

## 2018-08-06 DIAGNOSIS — F419 Anxiety disorder, unspecified: Secondary | ICD-10-CM | POA: Diagnosis not present

## 2018-08-06 DIAGNOSIS — L89323 Pressure ulcer of left buttock, stage 3: Secondary | ICD-10-CM | POA: Diagnosis not present

## 2018-08-06 DIAGNOSIS — R030 Elevated blood-pressure reading, without diagnosis of hypertension: Secondary | ICD-10-CM | POA: Diagnosis not present

## 2018-08-11 DIAGNOSIS — S71101D Unspecified open wound, right thigh, subsequent encounter: Secondary | ICD-10-CM | POA: Diagnosis not present

## 2018-08-11 DIAGNOSIS — K5901 Slow transit constipation: Secondary | ICD-10-CM | POA: Diagnosis not present

## 2018-08-11 DIAGNOSIS — R1312 Dysphagia, oropharyngeal phase: Secondary | ICD-10-CM | POA: Diagnosis not present

## 2018-08-11 DIAGNOSIS — F419 Anxiety disorder, unspecified: Secondary | ICD-10-CM | POA: Diagnosis not present

## 2018-08-11 DIAGNOSIS — R030 Elevated blood-pressure reading, without diagnosis of hypertension: Secondary | ICD-10-CM | POA: Diagnosis not present

## 2018-08-11 DIAGNOSIS — M62830 Muscle spasm of back: Secondary | ICD-10-CM | POA: Diagnosis not present

## 2018-08-11 DIAGNOSIS — L89323 Pressure ulcer of left buttock, stage 3: Secondary | ICD-10-CM | POA: Diagnosis not present

## 2018-08-11 DIAGNOSIS — Z7401 Bed confinement status: Secondary | ICD-10-CM | POA: Diagnosis not present

## 2018-08-11 DIAGNOSIS — G35 Multiple sclerosis: Secondary | ICD-10-CM | POA: Diagnosis not present

## 2018-08-12 DIAGNOSIS — R030 Elevated blood-pressure reading, without diagnosis of hypertension: Secondary | ICD-10-CM | POA: Diagnosis not present

## 2018-08-12 DIAGNOSIS — L89323 Pressure ulcer of left buttock, stage 3: Secondary | ICD-10-CM | POA: Diagnosis not present

## 2018-08-12 DIAGNOSIS — Z7401 Bed confinement status: Secondary | ICD-10-CM | POA: Diagnosis not present

## 2018-08-12 DIAGNOSIS — M62830 Muscle spasm of back: Secondary | ICD-10-CM | POA: Diagnosis not present

## 2018-08-12 DIAGNOSIS — S71101D Unspecified open wound, right thigh, subsequent encounter: Secondary | ICD-10-CM | POA: Diagnosis not present

## 2018-08-12 DIAGNOSIS — R1312 Dysphagia, oropharyngeal phase: Secondary | ICD-10-CM | POA: Diagnosis not present

## 2018-08-12 DIAGNOSIS — G35 Multiple sclerosis: Secondary | ICD-10-CM | POA: Diagnosis not present

## 2018-08-12 DIAGNOSIS — K5901 Slow transit constipation: Secondary | ICD-10-CM | POA: Diagnosis not present

## 2018-08-12 DIAGNOSIS — F419 Anxiety disorder, unspecified: Secondary | ICD-10-CM | POA: Diagnosis not present

## 2018-08-14 DIAGNOSIS — G35 Multiple sclerosis: Secondary | ICD-10-CM | POA: Diagnosis not present

## 2018-08-14 DIAGNOSIS — S71101D Unspecified open wound, right thigh, subsequent encounter: Secondary | ICD-10-CM | POA: Diagnosis not present

## 2018-08-14 DIAGNOSIS — R1312 Dysphagia, oropharyngeal phase: Secondary | ICD-10-CM | POA: Diagnosis not present

## 2018-08-14 DIAGNOSIS — Z7401 Bed confinement status: Secondary | ICD-10-CM | POA: Diagnosis not present

## 2018-08-14 DIAGNOSIS — L89323 Pressure ulcer of left buttock, stage 3: Secondary | ICD-10-CM | POA: Diagnosis not present

## 2018-08-14 DIAGNOSIS — F419 Anxiety disorder, unspecified: Secondary | ICD-10-CM | POA: Diagnosis not present

## 2018-08-14 DIAGNOSIS — M62830 Muscle spasm of back: Secondary | ICD-10-CM | POA: Diagnosis not present

## 2018-08-14 DIAGNOSIS — K5901 Slow transit constipation: Secondary | ICD-10-CM | POA: Diagnosis not present

## 2018-08-14 DIAGNOSIS — R030 Elevated blood-pressure reading, without diagnosis of hypertension: Secondary | ICD-10-CM | POA: Diagnosis not present

## 2018-08-15 DIAGNOSIS — G629 Polyneuropathy, unspecified: Secondary | ICD-10-CM | POA: Diagnosis not present

## 2018-08-15 DIAGNOSIS — E538 Deficiency of other specified B group vitamins: Secondary | ICD-10-CM | POA: Diagnosis not present

## 2018-08-15 DIAGNOSIS — B354 Tinea corporis: Secondary | ICD-10-CM | POA: Diagnosis not present

## 2018-08-15 DIAGNOSIS — M1990 Unspecified osteoarthritis, unspecified site: Secondary | ICD-10-CM | POA: Diagnosis not present

## 2018-08-15 DIAGNOSIS — I10 Essential (primary) hypertension: Secondary | ICD-10-CM | POA: Diagnosis not present

## 2018-08-15 DIAGNOSIS — M545 Low back pain: Secondary | ICD-10-CM | POA: Diagnosis not present

## 2018-08-15 DIAGNOSIS — E785 Hyperlipidemia, unspecified: Secondary | ICD-10-CM | POA: Diagnosis not present

## 2018-08-15 DIAGNOSIS — G4700 Insomnia, unspecified: Secondary | ICD-10-CM | POA: Diagnosis not present

## 2018-08-15 DIAGNOSIS — G35 Multiple sclerosis: Secondary | ICD-10-CM | POA: Diagnosis not present

## 2018-08-17 DIAGNOSIS — M62830 Muscle spasm of back: Secondary | ICD-10-CM | POA: Diagnosis not present

## 2018-08-17 DIAGNOSIS — G35 Multiple sclerosis: Secondary | ICD-10-CM | POA: Diagnosis not present

## 2018-08-17 DIAGNOSIS — R030 Elevated blood-pressure reading, without diagnosis of hypertension: Secondary | ICD-10-CM | POA: Diagnosis not present

## 2018-08-17 DIAGNOSIS — R1312 Dysphagia, oropharyngeal phase: Secondary | ICD-10-CM | POA: Diagnosis not present

## 2018-08-17 DIAGNOSIS — Z7401 Bed confinement status: Secondary | ICD-10-CM | POA: Diagnosis not present

## 2018-08-17 DIAGNOSIS — L89323 Pressure ulcer of left buttock, stage 3: Secondary | ICD-10-CM | POA: Diagnosis not present

## 2018-08-17 DIAGNOSIS — K5901 Slow transit constipation: Secondary | ICD-10-CM | POA: Diagnosis not present

## 2018-08-17 DIAGNOSIS — S71101D Unspecified open wound, right thigh, subsequent encounter: Secondary | ICD-10-CM | POA: Diagnosis not present

## 2018-08-17 DIAGNOSIS — F419 Anxiety disorder, unspecified: Secondary | ICD-10-CM | POA: Diagnosis not present

## 2018-08-20 DIAGNOSIS — L89323 Pressure ulcer of left buttock, stage 3: Secondary | ICD-10-CM | POA: Diagnosis not present

## 2018-08-20 DIAGNOSIS — S71101D Unspecified open wound, right thigh, subsequent encounter: Secondary | ICD-10-CM | POA: Diagnosis not present

## 2018-08-20 DIAGNOSIS — R030 Elevated blood-pressure reading, without diagnosis of hypertension: Secondary | ICD-10-CM | POA: Diagnosis not present

## 2018-08-20 DIAGNOSIS — R1312 Dysphagia, oropharyngeal phase: Secondary | ICD-10-CM | POA: Diagnosis not present

## 2018-08-20 DIAGNOSIS — K5901 Slow transit constipation: Secondary | ICD-10-CM | POA: Diagnosis not present

## 2018-08-20 DIAGNOSIS — G35 Multiple sclerosis: Secondary | ICD-10-CM | POA: Diagnosis not present

## 2018-08-20 DIAGNOSIS — M62830 Muscle spasm of back: Secondary | ICD-10-CM | POA: Diagnosis not present

## 2018-08-20 DIAGNOSIS — Z7401 Bed confinement status: Secondary | ICD-10-CM | POA: Diagnosis not present

## 2018-08-20 DIAGNOSIS — F419 Anxiety disorder, unspecified: Secondary | ICD-10-CM | POA: Diagnosis not present

## 2018-08-24 DIAGNOSIS — K5901 Slow transit constipation: Secondary | ICD-10-CM | POA: Diagnosis not present

## 2018-08-24 DIAGNOSIS — R030 Elevated blood-pressure reading, without diagnosis of hypertension: Secondary | ICD-10-CM | POA: Diagnosis not present

## 2018-08-24 DIAGNOSIS — M62830 Muscle spasm of back: Secondary | ICD-10-CM | POA: Diagnosis not present

## 2018-08-24 DIAGNOSIS — R1312 Dysphagia, oropharyngeal phase: Secondary | ICD-10-CM | POA: Diagnosis not present

## 2018-08-24 DIAGNOSIS — F419 Anxiety disorder, unspecified: Secondary | ICD-10-CM | POA: Diagnosis not present

## 2018-08-24 DIAGNOSIS — Z7401 Bed confinement status: Secondary | ICD-10-CM | POA: Diagnosis not present

## 2018-08-24 DIAGNOSIS — G35 Multiple sclerosis: Secondary | ICD-10-CM | POA: Diagnosis not present

## 2018-08-24 DIAGNOSIS — S71101D Unspecified open wound, right thigh, subsequent encounter: Secondary | ICD-10-CM | POA: Diagnosis not present

## 2018-08-24 DIAGNOSIS — L89323 Pressure ulcer of left buttock, stage 3: Secondary | ICD-10-CM | POA: Diagnosis not present

## 2018-08-27 DIAGNOSIS — Z7401 Bed confinement status: Secondary | ICD-10-CM | POA: Diagnosis not present

## 2018-08-27 DIAGNOSIS — M62830 Muscle spasm of back: Secondary | ICD-10-CM | POA: Diagnosis not present

## 2018-08-27 DIAGNOSIS — K5901 Slow transit constipation: Secondary | ICD-10-CM | POA: Diagnosis not present

## 2018-08-27 DIAGNOSIS — S71101D Unspecified open wound, right thigh, subsequent encounter: Secondary | ICD-10-CM | POA: Diagnosis not present

## 2018-08-27 DIAGNOSIS — F419 Anxiety disorder, unspecified: Secondary | ICD-10-CM | POA: Diagnosis not present

## 2018-08-27 DIAGNOSIS — R1312 Dysphagia, oropharyngeal phase: Secondary | ICD-10-CM | POA: Diagnosis not present

## 2018-08-27 DIAGNOSIS — R030 Elevated blood-pressure reading, without diagnosis of hypertension: Secondary | ICD-10-CM | POA: Diagnosis not present

## 2018-08-27 DIAGNOSIS — L89323 Pressure ulcer of left buttock, stage 3: Secondary | ICD-10-CM | POA: Diagnosis not present

## 2018-08-27 DIAGNOSIS — G35 Multiple sclerosis: Secondary | ICD-10-CM | POA: Diagnosis not present

## 2018-09-01 DIAGNOSIS — L89323 Pressure ulcer of left buttock, stage 3: Secondary | ICD-10-CM | POA: Diagnosis not present

## 2018-09-01 DIAGNOSIS — R030 Elevated blood-pressure reading, without diagnosis of hypertension: Secondary | ICD-10-CM | POA: Diagnosis not present

## 2018-09-01 DIAGNOSIS — S71101D Unspecified open wound, right thigh, subsequent encounter: Secondary | ICD-10-CM | POA: Diagnosis not present

## 2018-09-01 DIAGNOSIS — F419 Anxiety disorder, unspecified: Secondary | ICD-10-CM | POA: Diagnosis not present

## 2018-09-01 DIAGNOSIS — Z7401 Bed confinement status: Secondary | ICD-10-CM | POA: Diagnosis not present

## 2018-09-01 DIAGNOSIS — R1312 Dysphagia, oropharyngeal phase: Secondary | ICD-10-CM | POA: Diagnosis not present

## 2018-09-01 DIAGNOSIS — G35 Multiple sclerosis: Secondary | ICD-10-CM | POA: Diagnosis not present

## 2018-09-01 DIAGNOSIS — M62830 Muscle spasm of back: Secondary | ICD-10-CM | POA: Diagnosis not present

## 2018-09-01 DIAGNOSIS — K5901 Slow transit constipation: Secondary | ICD-10-CM | POA: Diagnosis not present

## 2018-09-02 DIAGNOSIS — R030 Elevated blood-pressure reading, without diagnosis of hypertension: Secondary | ICD-10-CM | POA: Diagnosis not present

## 2018-09-02 DIAGNOSIS — Z7401 Bed confinement status: Secondary | ICD-10-CM | POA: Diagnosis not present

## 2018-09-02 DIAGNOSIS — K5901 Slow transit constipation: Secondary | ICD-10-CM | POA: Diagnosis not present

## 2018-09-02 DIAGNOSIS — S71101D Unspecified open wound, right thigh, subsequent encounter: Secondary | ICD-10-CM | POA: Diagnosis not present

## 2018-09-02 DIAGNOSIS — F419 Anxiety disorder, unspecified: Secondary | ICD-10-CM | POA: Diagnosis not present

## 2018-09-02 DIAGNOSIS — G35 Multiple sclerosis: Secondary | ICD-10-CM | POA: Diagnosis not present

## 2018-09-02 DIAGNOSIS — M62830 Muscle spasm of back: Secondary | ICD-10-CM | POA: Diagnosis not present

## 2018-09-02 DIAGNOSIS — L89323 Pressure ulcer of left buttock, stage 3: Secondary | ICD-10-CM | POA: Diagnosis not present

## 2018-09-02 DIAGNOSIS — R1312 Dysphagia, oropharyngeal phase: Secondary | ICD-10-CM | POA: Diagnosis not present

## 2018-09-07 DIAGNOSIS — L89323 Pressure ulcer of left buttock, stage 3: Secondary | ICD-10-CM | POA: Diagnosis not present

## 2018-09-07 DIAGNOSIS — R1312 Dysphagia, oropharyngeal phase: Secondary | ICD-10-CM | POA: Diagnosis not present

## 2018-09-07 DIAGNOSIS — K5901 Slow transit constipation: Secondary | ICD-10-CM | POA: Diagnosis not present

## 2018-09-07 DIAGNOSIS — Z7401 Bed confinement status: Secondary | ICD-10-CM | POA: Diagnosis not present

## 2018-09-07 DIAGNOSIS — G35 Multiple sclerosis: Secondary | ICD-10-CM | POA: Diagnosis not present

## 2018-09-07 DIAGNOSIS — R030 Elevated blood-pressure reading, without diagnosis of hypertension: Secondary | ICD-10-CM | POA: Diagnosis not present

## 2018-09-07 DIAGNOSIS — M62830 Muscle spasm of back: Secondary | ICD-10-CM | POA: Diagnosis not present

## 2018-09-07 DIAGNOSIS — S71101D Unspecified open wound, right thigh, subsequent encounter: Secondary | ICD-10-CM | POA: Diagnosis not present

## 2018-09-07 DIAGNOSIS — F419 Anxiety disorder, unspecified: Secondary | ICD-10-CM | POA: Diagnosis not present

## 2018-09-09 DIAGNOSIS — R600 Localized edema: Secondary | ICD-10-CM | POA: Diagnosis not present

## 2018-09-09 DIAGNOSIS — L89899 Pressure ulcer of other site, unspecified stage: Secondary | ICD-10-CM | POA: Diagnosis not present

## 2018-09-09 DIAGNOSIS — Z79899 Other long term (current) drug therapy: Secondary | ICD-10-CM | POA: Diagnosis not present

## 2018-09-09 DIAGNOSIS — G35 Multiple sclerosis: Secondary | ICD-10-CM | POA: Diagnosis not present

## 2018-09-14 DIAGNOSIS — Z7401 Bed confinement status: Secondary | ICD-10-CM | POA: Diagnosis not present

## 2018-09-14 DIAGNOSIS — F419 Anxiety disorder, unspecified: Secondary | ICD-10-CM | POA: Diagnosis not present

## 2018-09-14 DIAGNOSIS — M62830 Muscle spasm of back: Secondary | ICD-10-CM | POA: Diagnosis not present

## 2018-09-14 DIAGNOSIS — Z4800 Encounter for change or removal of nonsurgical wound dressing: Secondary | ICD-10-CM | POA: Diagnosis not present

## 2018-09-14 DIAGNOSIS — R1312 Dysphagia, oropharyngeal phase: Secondary | ICD-10-CM | POA: Diagnosis not present

## 2018-09-14 DIAGNOSIS — L89323 Pressure ulcer of left buttock, stage 3: Secondary | ICD-10-CM | POA: Diagnosis not present

## 2018-09-14 DIAGNOSIS — G35 Multiple sclerosis: Secondary | ICD-10-CM | POA: Diagnosis not present

## 2018-09-14 DIAGNOSIS — R030 Elevated blood-pressure reading, without diagnosis of hypertension: Secondary | ICD-10-CM | POA: Diagnosis not present

## 2018-09-14 DIAGNOSIS — K5901 Slow transit constipation: Secondary | ICD-10-CM | POA: Diagnosis not present

## 2018-09-16 DIAGNOSIS — R1312 Dysphagia, oropharyngeal phase: Secondary | ICD-10-CM | POA: Diagnosis not present

## 2018-09-16 DIAGNOSIS — L89323 Pressure ulcer of left buttock, stage 3: Secondary | ICD-10-CM | POA: Diagnosis not present

## 2018-09-16 DIAGNOSIS — G35 Multiple sclerosis: Secondary | ICD-10-CM | POA: Diagnosis not present

## 2018-09-18 DIAGNOSIS — F419 Anxiety disorder, unspecified: Secondary | ICD-10-CM | POA: Diagnosis not present

## 2018-09-18 DIAGNOSIS — R1312 Dysphagia, oropharyngeal phase: Secondary | ICD-10-CM | POA: Diagnosis not present

## 2018-09-18 DIAGNOSIS — Z7401 Bed confinement status: Secondary | ICD-10-CM | POA: Diagnosis not present

## 2018-09-18 DIAGNOSIS — G35 Multiple sclerosis: Secondary | ICD-10-CM | POA: Diagnosis not present

## 2018-09-18 DIAGNOSIS — R030 Elevated blood-pressure reading, without diagnosis of hypertension: Secondary | ICD-10-CM | POA: Diagnosis not present

## 2018-09-18 DIAGNOSIS — M62830 Muscle spasm of back: Secondary | ICD-10-CM | POA: Diagnosis not present

## 2018-09-18 DIAGNOSIS — K5901 Slow transit constipation: Secondary | ICD-10-CM | POA: Diagnosis not present

## 2018-09-18 DIAGNOSIS — Z4800 Encounter for change or removal of nonsurgical wound dressing: Secondary | ICD-10-CM | POA: Diagnosis not present

## 2018-09-18 DIAGNOSIS — L89323 Pressure ulcer of left buttock, stage 3: Secondary | ICD-10-CM | POA: Diagnosis not present

## 2018-09-22 DIAGNOSIS — F419 Anxiety disorder, unspecified: Secondary | ICD-10-CM | POA: Diagnosis not present

## 2018-09-22 DIAGNOSIS — R030 Elevated blood-pressure reading, without diagnosis of hypertension: Secondary | ICD-10-CM | POA: Diagnosis not present

## 2018-09-22 DIAGNOSIS — K5901 Slow transit constipation: Secondary | ICD-10-CM | POA: Diagnosis not present

## 2018-09-22 DIAGNOSIS — Z7401 Bed confinement status: Secondary | ICD-10-CM | POA: Diagnosis not present

## 2018-09-22 DIAGNOSIS — L89323 Pressure ulcer of left buttock, stage 3: Secondary | ICD-10-CM | POA: Diagnosis not present

## 2018-09-22 DIAGNOSIS — M62830 Muscle spasm of back: Secondary | ICD-10-CM | POA: Diagnosis not present

## 2018-09-22 DIAGNOSIS — Z4800 Encounter for change or removal of nonsurgical wound dressing: Secondary | ICD-10-CM | POA: Diagnosis not present

## 2018-09-22 DIAGNOSIS — G35 Multiple sclerosis: Secondary | ICD-10-CM | POA: Diagnosis not present

## 2018-09-22 DIAGNOSIS — R1312 Dysphagia, oropharyngeal phase: Secondary | ICD-10-CM | POA: Diagnosis not present

## 2018-09-25 DIAGNOSIS — R030 Elevated blood-pressure reading, without diagnosis of hypertension: Secondary | ICD-10-CM | POA: Diagnosis not present

## 2018-09-25 DIAGNOSIS — R1312 Dysphagia, oropharyngeal phase: Secondary | ICD-10-CM | POA: Diagnosis not present

## 2018-09-25 DIAGNOSIS — G35 Multiple sclerosis: Secondary | ICD-10-CM | POA: Diagnosis not present

## 2018-09-25 DIAGNOSIS — Z4800 Encounter for change or removal of nonsurgical wound dressing: Secondary | ICD-10-CM | POA: Diagnosis not present

## 2018-09-25 DIAGNOSIS — Z7401 Bed confinement status: Secondary | ICD-10-CM | POA: Diagnosis not present

## 2018-09-25 DIAGNOSIS — M62830 Muscle spasm of back: Secondary | ICD-10-CM | POA: Diagnosis not present

## 2018-09-25 DIAGNOSIS — K5901 Slow transit constipation: Secondary | ICD-10-CM | POA: Diagnosis not present

## 2018-09-25 DIAGNOSIS — F419 Anxiety disorder, unspecified: Secondary | ICD-10-CM | POA: Diagnosis not present

## 2018-09-25 DIAGNOSIS — L89323 Pressure ulcer of left buttock, stage 3: Secondary | ICD-10-CM | POA: Diagnosis not present

## 2018-09-27 DIAGNOSIS — L89323 Pressure ulcer of left buttock, stage 3: Secondary | ICD-10-CM | POA: Diagnosis not present

## 2018-09-27 DIAGNOSIS — M62830 Muscle spasm of back: Secondary | ICD-10-CM | POA: Diagnosis not present

## 2018-09-27 DIAGNOSIS — Z7401 Bed confinement status: Secondary | ICD-10-CM | POA: Diagnosis not present

## 2018-09-27 DIAGNOSIS — Z4800 Encounter for change or removal of nonsurgical wound dressing: Secondary | ICD-10-CM | POA: Diagnosis not present

## 2018-09-27 DIAGNOSIS — F419 Anxiety disorder, unspecified: Secondary | ICD-10-CM | POA: Diagnosis not present

## 2018-09-27 DIAGNOSIS — R030 Elevated blood-pressure reading, without diagnosis of hypertension: Secondary | ICD-10-CM | POA: Diagnosis not present

## 2018-09-27 DIAGNOSIS — R1312 Dysphagia, oropharyngeal phase: Secondary | ICD-10-CM | POA: Diagnosis not present

## 2018-09-27 DIAGNOSIS — K5901 Slow transit constipation: Secondary | ICD-10-CM | POA: Diagnosis not present

## 2018-09-27 DIAGNOSIS — G35 Multiple sclerosis: Secondary | ICD-10-CM | POA: Diagnosis not present

## 2018-09-30 DIAGNOSIS — G8220 Paraplegia, unspecified: Secondary | ICD-10-CM | POA: Diagnosis not present

## 2018-09-30 DIAGNOSIS — G35 Multiple sclerosis: Secondary | ICD-10-CM | POA: Diagnosis not present

## 2018-10-02 DIAGNOSIS — Z7401 Bed confinement status: Secondary | ICD-10-CM | POA: Diagnosis not present

## 2018-10-02 DIAGNOSIS — G35 Multiple sclerosis: Secondary | ICD-10-CM | POA: Diagnosis not present

## 2018-10-02 DIAGNOSIS — M62830 Muscle spasm of back: Secondary | ICD-10-CM | POA: Diagnosis not present

## 2018-10-02 DIAGNOSIS — R1312 Dysphagia, oropharyngeal phase: Secondary | ICD-10-CM | POA: Diagnosis not present

## 2018-10-02 DIAGNOSIS — K5901 Slow transit constipation: Secondary | ICD-10-CM | POA: Diagnosis not present

## 2018-10-02 DIAGNOSIS — L89323 Pressure ulcer of left buttock, stage 3: Secondary | ICD-10-CM | POA: Diagnosis not present

## 2018-10-02 DIAGNOSIS — F419 Anxiety disorder, unspecified: Secondary | ICD-10-CM | POA: Diagnosis not present

## 2018-10-02 DIAGNOSIS — Z4800 Encounter for change or removal of nonsurgical wound dressing: Secondary | ICD-10-CM | POA: Diagnosis not present

## 2018-10-02 DIAGNOSIS — R030 Elevated blood-pressure reading, without diagnosis of hypertension: Secondary | ICD-10-CM | POA: Diagnosis not present

## 2018-10-05 DIAGNOSIS — Z4800 Encounter for change or removal of nonsurgical wound dressing: Secondary | ICD-10-CM | POA: Diagnosis not present

## 2018-10-05 DIAGNOSIS — R1312 Dysphagia, oropharyngeal phase: Secondary | ICD-10-CM | POA: Diagnosis not present

## 2018-10-05 DIAGNOSIS — Z7401 Bed confinement status: Secondary | ICD-10-CM | POA: Diagnosis not present

## 2018-10-05 DIAGNOSIS — F419 Anxiety disorder, unspecified: Secondary | ICD-10-CM | POA: Diagnosis not present

## 2018-10-05 DIAGNOSIS — R030 Elevated blood-pressure reading, without diagnosis of hypertension: Secondary | ICD-10-CM | POA: Diagnosis not present

## 2018-10-05 DIAGNOSIS — K5901 Slow transit constipation: Secondary | ICD-10-CM | POA: Diagnosis not present

## 2018-10-05 DIAGNOSIS — M62830 Muscle spasm of back: Secondary | ICD-10-CM | POA: Diagnosis not present

## 2018-10-05 DIAGNOSIS — L89323 Pressure ulcer of left buttock, stage 3: Secondary | ICD-10-CM | POA: Diagnosis not present

## 2018-10-05 DIAGNOSIS — G35 Multiple sclerosis: Secondary | ICD-10-CM | POA: Diagnosis not present

## 2018-10-09 DIAGNOSIS — Z7401 Bed confinement status: Secondary | ICD-10-CM | POA: Diagnosis not present

## 2018-10-09 DIAGNOSIS — R1312 Dysphagia, oropharyngeal phase: Secondary | ICD-10-CM | POA: Diagnosis not present

## 2018-10-09 DIAGNOSIS — M62830 Muscle spasm of back: Secondary | ICD-10-CM | POA: Diagnosis not present

## 2018-10-09 DIAGNOSIS — R030 Elevated blood-pressure reading, without diagnosis of hypertension: Secondary | ICD-10-CM | POA: Diagnosis not present

## 2018-10-09 DIAGNOSIS — L89323 Pressure ulcer of left buttock, stage 3: Secondary | ICD-10-CM | POA: Diagnosis not present

## 2018-10-09 DIAGNOSIS — F419 Anxiety disorder, unspecified: Secondary | ICD-10-CM | POA: Diagnosis not present

## 2018-10-09 DIAGNOSIS — Z4800 Encounter for change or removal of nonsurgical wound dressing: Secondary | ICD-10-CM | POA: Diagnosis not present

## 2018-10-09 DIAGNOSIS — G35 Multiple sclerosis: Secondary | ICD-10-CM | POA: Diagnosis not present

## 2018-10-09 DIAGNOSIS — K5901 Slow transit constipation: Secondary | ICD-10-CM | POA: Diagnosis not present

## 2018-10-13 DIAGNOSIS — R030 Elevated blood-pressure reading, without diagnosis of hypertension: Secondary | ICD-10-CM | POA: Diagnosis not present

## 2018-10-13 DIAGNOSIS — G35 Multiple sclerosis: Secondary | ICD-10-CM | POA: Diagnosis not present

## 2018-10-13 DIAGNOSIS — F419 Anxiety disorder, unspecified: Secondary | ICD-10-CM | POA: Diagnosis not present

## 2018-10-13 DIAGNOSIS — R1312 Dysphagia, oropharyngeal phase: Secondary | ICD-10-CM | POA: Diagnosis not present

## 2018-10-13 DIAGNOSIS — Z7401 Bed confinement status: Secondary | ICD-10-CM | POA: Diagnosis not present

## 2018-10-13 DIAGNOSIS — M62830 Muscle spasm of back: Secondary | ICD-10-CM | POA: Diagnosis not present

## 2018-10-13 DIAGNOSIS — K5901 Slow transit constipation: Secondary | ICD-10-CM | POA: Diagnosis not present

## 2018-10-13 DIAGNOSIS — L89323 Pressure ulcer of left buttock, stage 3: Secondary | ICD-10-CM | POA: Diagnosis not present

## 2018-10-13 DIAGNOSIS — Z4800 Encounter for change or removal of nonsurgical wound dressing: Secondary | ICD-10-CM | POA: Diagnosis not present

## 2018-10-16 DIAGNOSIS — Z7401 Bed confinement status: Secondary | ICD-10-CM | POA: Diagnosis not present

## 2018-10-16 DIAGNOSIS — L89323 Pressure ulcer of left buttock, stage 3: Secondary | ICD-10-CM | POA: Diagnosis not present

## 2018-10-16 DIAGNOSIS — Z4800 Encounter for change or removal of nonsurgical wound dressing: Secondary | ICD-10-CM | POA: Diagnosis not present

## 2018-10-16 DIAGNOSIS — K5901 Slow transit constipation: Secondary | ICD-10-CM | POA: Diagnosis not present

## 2018-10-16 DIAGNOSIS — F419 Anxiety disorder, unspecified: Secondary | ICD-10-CM | POA: Diagnosis not present

## 2018-10-16 DIAGNOSIS — R1312 Dysphagia, oropharyngeal phase: Secondary | ICD-10-CM | POA: Diagnosis not present

## 2018-10-16 DIAGNOSIS — G35 Multiple sclerosis: Secondary | ICD-10-CM | POA: Diagnosis not present

## 2018-10-16 DIAGNOSIS — M62830 Muscle spasm of back: Secondary | ICD-10-CM | POA: Diagnosis not present

## 2018-10-16 DIAGNOSIS — R030 Elevated blood-pressure reading, without diagnosis of hypertension: Secondary | ICD-10-CM | POA: Diagnosis not present

## 2018-10-20 DIAGNOSIS — G35 Multiple sclerosis: Secondary | ICD-10-CM | POA: Diagnosis not present

## 2018-10-20 DIAGNOSIS — R1312 Dysphagia, oropharyngeal phase: Secondary | ICD-10-CM | POA: Diagnosis not present

## 2018-10-20 DIAGNOSIS — Z4800 Encounter for change or removal of nonsurgical wound dressing: Secondary | ICD-10-CM | POA: Diagnosis not present

## 2018-10-20 DIAGNOSIS — L89323 Pressure ulcer of left buttock, stage 3: Secondary | ICD-10-CM | POA: Diagnosis not present

## 2018-10-20 DIAGNOSIS — R030 Elevated blood-pressure reading, without diagnosis of hypertension: Secondary | ICD-10-CM | POA: Diagnosis not present

## 2018-10-20 DIAGNOSIS — K5901 Slow transit constipation: Secondary | ICD-10-CM | POA: Diagnosis not present

## 2018-10-20 DIAGNOSIS — M62830 Muscle spasm of back: Secondary | ICD-10-CM | POA: Diagnosis not present

## 2018-10-20 DIAGNOSIS — F419 Anxiety disorder, unspecified: Secondary | ICD-10-CM | POA: Diagnosis not present

## 2018-10-20 DIAGNOSIS — Z7401 Bed confinement status: Secondary | ICD-10-CM | POA: Diagnosis not present

## 2018-10-23 DIAGNOSIS — R030 Elevated blood-pressure reading, without diagnosis of hypertension: Secondary | ICD-10-CM | POA: Diagnosis not present

## 2018-10-23 DIAGNOSIS — G35 Multiple sclerosis: Secondary | ICD-10-CM | POA: Diagnosis not present

## 2018-10-23 DIAGNOSIS — L89323 Pressure ulcer of left buttock, stage 3: Secondary | ICD-10-CM | POA: Diagnosis not present

## 2018-10-23 DIAGNOSIS — Z7401 Bed confinement status: Secondary | ICD-10-CM | POA: Diagnosis not present

## 2018-10-23 DIAGNOSIS — K5901 Slow transit constipation: Secondary | ICD-10-CM | POA: Diagnosis not present

## 2018-10-23 DIAGNOSIS — Z4800 Encounter for change or removal of nonsurgical wound dressing: Secondary | ICD-10-CM | POA: Diagnosis not present

## 2018-10-23 DIAGNOSIS — R1312 Dysphagia, oropharyngeal phase: Secondary | ICD-10-CM | POA: Diagnosis not present

## 2018-10-23 DIAGNOSIS — M62830 Muscle spasm of back: Secondary | ICD-10-CM | POA: Diagnosis not present

## 2018-10-23 DIAGNOSIS — F419 Anxiety disorder, unspecified: Secondary | ICD-10-CM | POA: Diagnosis not present

## 2018-10-27 DIAGNOSIS — F419 Anxiety disorder, unspecified: Secondary | ICD-10-CM | POA: Diagnosis not present

## 2018-10-27 DIAGNOSIS — M62830 Muscle spasm of back: Secondary | ICD-10-CM | POA: Diagnosis not present

## 2018-10-27 DIAGNOSIS — Z4800 Encounter for change or removal of nonsurgical wound dressing: Secondary | ICD-10-CM | POA: Diagnosis not present

## 2018-10-27 DIAGNOSIS — Z7401 Bed confinement status: Secondary | ICD-10-CM | POA: Diagnosis not present

## 2018-10-27 DIAGNOSIS — L89323 Pressure ulcer of left buttock, stage 3: Secondary | ICD-10-CM | POA: Diagnosis not present

## 2018-10-27 DIAGNOSIS — R1312 Dysphagia, oropharyngeal phase: Secondary | ICD-10-CM | POA: Diagnosis not present

## 2018-10-27 DIAGNOSIS — R030 Elevated blood-pressure reading, without diagnosis of hypertension: Secondary | ICD-10-CM | POA: Diagnosis not present

## 2018-10-27 DIAGNOSIS — K5901 Slow transit constipation: Secondary | ICD-10-CM | POA: Diagnosis not present

## 2018-10-27 DIAGNOSIS — G35 Multiple sclerosis: Secondary | ICD-10-CM | POA: Diagnosis not present

## 2018-10-30 DIAGNOSIS — F419 Anxiety disorder, unspecified: Secondary | ICD-10-CM | POA: Diagnosis not present

## 2018-10-30 DIAGNOSIS — L89323 Pressure ulcer of left buttock, stage 3: Secondary | ICD-10-CM | POA: Diagnosis not present

## 2018-10-30 DIAGNOSIS — G35 Multiple sclerosis: Secondary | ICD-10-CM | POA: Diagnosis not present

## 2018-10-30 DIAGNOSIS — M62830 Muscle spasm of back: Secondary | ICD-10-CM | POA: Diagnosis not present

## 2018-10-30 DIAGNOSIS — Z4800 Encounter for change or removal of nonsurgical wound dressing: Secondary | ICD-10-CM | POA: Diagnosis not present

## 2018-10-30 DIAGNOSIS — Z7401 Bed confinement status: Secondary | ICD-10-CM | POA: Diagnosis not present

## 2018-10-30 DIAGNOSIS — K5901 Slow transit constipation: Secondary | ICD-10-CM | POA: Diagnosis not present

## 2018-10-30 DIAGNOSIS — R1312 Dysphagia, oropharyngeal phase: Secondary | ICD-10-CM | POA: Diagnosis not present

## 2018-10-30 DIAGNOSIS — R030 Elevated blood-pressure reading, without diagnosis of hypertension: Secondary | ICD-10-CM | POA: Diagnosis not present

## 2018-10-31 DIAGNOSIS — G35 Multiple sclerosis: Secondary | ICD-10-CM | POA: Diagnosis not present

## 2018-10-31 DIAGNOSIS — G8220 Paraplegia, unspecified: Secondary | ICD-10-CM | POA: Diagnosis not present

## 2018-11-03 DIAGNOSIS — Z4800 Encounter for change or removal of nonsurgical wound dressing: Secondary | ICD-10-CM | POA: Diagnosis not present

## 2018-11-03 DIAGNOSIS — K5901 Slow transit constipation: Secondary | ICD-10-CM | POA: Diagnosis not present

## 2018-11-03 DIAGNOSIS — R1312 Dysphagia, oropharyngeal phase: Secondary | ICD-10-CM | POA: Diagnosis not present

## 2018-11-03 DIAGNOSIS — F419 Anxiety disorder, unspecified: Secondary | ICD-10-CM | POA: Diagnosis not present

## 2018-11-03 DIAGNOSIS — L89323 Pressure ulcer of left buttock, stage 3: Secondary | ICD-10-CM | POA: Diagnosis not present

## 2018-11-03 DIAGNOSIS — R030 Elevated blood-pressure reading, without diagnosis of hypertension: Secondary | ICD-10-CM | POA: Diagnosis not present

## 2018-11-03 DIAGNOSIS — M62830 Muscle spasm of back: Secondary | ICD-10-CM | POA: Diagnosis not present

## 2018-11-03 DIAGNOSIS — Z7401 Bed confinement status: Secondary | ICD-10-CM | POA: Diagnosis not present

## 2018-11-03 DIAGNOSIS — G35 Multiple sclerosis: Secondary | ICD-10-CM | POA: Diagnosis not present

## 2018-11-06 DIAGNOSIS — G35 Multiple sclerosis: Secondary | ICD-10-CM | POA: Diagnosis not present

## 2018-11-06 DIAGNOSIS — M62830 Muscle spasm of back: Secondary | ICD-10-CM | POA: Diagnosis not present

## 2018-11-06 DIAGNOSIS — K5901 Slow transit constipation: Secondary | ICD-10-CM | POA: Diagnosis not present

## 2018-11-06 DIAGNOSIS — R1312 Dysphagia, oropharyngeal phase: Secondary | ICD-10-CM | POA: Diagnosis not present

## 2018-11-06 DIAGNOSIS — Z4800 Encounter for change or removal of nonsurgical wound dressing: Secondary | ICD-10-CM | POA: Diagnosis not present

## 2018-11-06 DIAGNOSIS — R030 Elevated blood-pressure reading, without diagnosis of hypertension: Secondary | ICD-10-CM | POA: Diagnosis not present

## 2018-11-06 DIAGNOSIS — Z7401 Bed confinement status: Secondary | ICD-10-CM | POA: Diagnosis not present

## 2018-11-06 DIAGNOSIS — F419 Anxiety disorder, unspecified: Secondary | ICD-10-CM | POA: Diagnosis not present

## 2018-11-06 DIAGNOSIS — L89323 Pressure ulcer of left buttock, stage 3: Secondary | ICD-10-CM | POA: Diagnosis not present

## 2018-11-10 DIAGNOSIS — F419 Anxiety disorder, unspecified: Secondary | ICD-10-CM | POA: Diagnosis not present

## 2018-11-10 DIAGNOSIS — Z4800 Encounter for change or removal of nonsurgical wound dressing: Secondary | ICD-10-CM | POA: Diagnosis not present

## 2018-11-10 DIAGNOSIS — Z7401 Bed confinement status: Secondary | ICD-10-CM | POA: Diagnosis not present

## 2018-11-10 DIAGNOSIS — G35 Multiple sclerosis: Secondary | ICD-10-CM | POA: Diagnosis not present

## 2018-11-10 DIAGNOSIS — R1312 Dysphagia, oropharyngeal phase: Secondary | ICD-10-CM | POA: Diagnosis not present

## 2018-11-10 DIAGNOSIS — L89313 Pressure ulcer of right buttock, stage 3: Secondary | ICD-10-CM | POA: Diagnosis not present

## 2018-11-10 DIAGNOSIS — K5901 Slow transit constipation: Secondary | ICD-10-CM | POA: Diagnosis not present

## 2018-11-10 DIAGNOSIS — R030 Elevated blood-pressure reading, without diagnosis of hypertension: Secondary | ICD-10-CM | POA: Diagnosis not present

## 2018-11-10 DIAGNOSIS — M62830 Muscle spasm of back: Secondary | ICD-10-CM | POA: Diagnosis not present

## 2018-11-14 DIAGNOSIS — Z4800 Encounter for change or removal of nonsurgical wound dressing: Secondary | ICD-10-CM | POA: Diagnosis not present

## 2018-11-14 DIAGNOSIS — M62830 Muscle spasm of back: Secondary | ICD-10-CM | POA: Diagnosis not present

## 2018-11-14 DIAGNOSIS — R030 Elevated blood-pressure reading, without diagnosis of hypertension: Secondary | ICD-10-CM | POA: Diagnosis not present

## 2018-11-14 DIAGNOSIS — L89313 Pressure ulcer of right buttock, stage 3: Secondary | ICD-10-CM | POA: Diagnosis not present

## 2018-11-14 DIAGNOSIS — Z7401 Bed confinement status: Secondary | ICD-10-CM | POA: Diagnosis not present

## 2018-11-14 DIAGNOSIS — F419 Anxiety disorder, unspecified: Secondary | ICD-10-CM | POA: Diagnosis not present

## 2018-11-14 DIAGNOSIS — R1312 Dysphagia, oropharyngeal phase: Secondary | ICD-10-CM | POA: Diagnosis not present

## 2018-11-14 DIAGNOSIS — K5901 Slow transit constipation: Secondary | ICD-10-CM | POA: Diagnosis not present

## 2018-11-14 DIAGNOSIS — G35 Multiple sclerosis: Secondary | ICD-10-CM | POA: Diagnosis not present

## 2018-11-19 DIAGNOSIS — R1312 Dysphagia, oropharyngeal phase: Secondary | ICD-10-CM | POA: Diagnosis not present

## 2018-11-19 DIAGNOSIS — R609 Edema, unspecified: Secondary | ICD-10-CM | POA: Diagnosis not present

## 2018-11-19 DIAGNOSIS — Z4800 Encounter for change or removal of nonsurgical wound dressing: Secondary | ICD-10-CM | POA: Diagnosis not present

## 2018-11-19 DIAGNOSIS — F419 Anxiety disorder, unspecified: Secondary | ICD-10-CM | POA: Diagnosis not present

## 2018-11-19 DIAGNOSIS — Z136 Encounter for screening for cardiovascular disorders: Secondary | ICD-10-CM | POA: Diagnosis not present

## 2018-11-19 DIAGNOSIS — L89313 Pressure ulcer of right buttock, stage 3: Secondary | ICD-10-CM | POA: Diagnosis not present

## 2018-11-19 DIAGNOSIS — M79604 Pain in right leg: Secondary | ICD-10-CM | POA: Diagnosis not present

## 2018-11-19 DIAGNOSIS — R030 Elevated blood-pressure reading, without diagnosis of hypertension: Secondary | ICD-10-CM | POA: Diagnosis not present

## 2018-11-19 DIAGNOSIS — Z7401 Bed confinement status: Secondary | ICD-10-CM | POA: Diagnosis not present

## 2018-11-19 DIAGNOSIS — M62830 Muscle spasm of back: Secondary | ICD-10-CM | POA: Diagnosis not present

## 2018-11-19 DIAGNOSIS — K5901 Slow transit constipation: Secondary | ICD-10-CM | POA: Diagnosis not present

## 2018-11-19 DIAGNOSIS — G35 Multiple sclerosis: Secondary | ICD-10-CM | POA: Diagnosis not present

## 2018-11-24 DIAGNOSIS — L89313 Pressure ulcer of right buttock, stage 3: Secondary | ICD-10-CM | POA: Diagnosis not present

## 2018-11-24 DIAGNOSIS — Z4800 Encounter for change or removal of nonsurgical wound dressing: Secondary | ICD-10-CM | POA: Diagnosis not present

## 2018-11-24 DIAGNOSIS — F419 Anxiety disorder, unspecified: Secondary | ICD-10-CM | POA: Diagnosis not present

## 2018-11-24 DIAGNOSIS — R1312 Dysphagia, oropharyngeal phase: Secondary | ICD-10-CM | POA: Diagnosis not present

## 2018-11-24 DIAGNOSIS — K5901 Slow transit constipation: Secondary | ICD-10-CM | POA: Diagnosis not present

## 2018-11-24 DIAGNOSIS — R030 Elevated blood-pressure reading, without diagnosis of hypertension: Secondary | ICD-10-CM | POA: Diagnosis not present

## 2018-11-24 DIAGNOSIS — R531 Weakness: Secondary | ICD-10-CM | POA: Diagnosis not present

## 2018-11-24 DIAGNOSIS — Z79899 Other long term (current) drug therapy: Secondary | ICD-10-CM | POA: Diagnosis not present

## 2018-11-24 DIAGNOSIS — G35 Multiple sclerosis: Secondary | ICD-10-CM | POA: Diagnosis not present

## 2018-11-24 DIAGNOSIS — Z7401 Bed confinement status: Secondary | ICD-10-CM | POA: Diagnosis not present

## 2018-11-24 DIAGNOSIS — M62830 Muscle spasm of back: Secondary | ICD-10-CM | POA: Diagnosis not present

## 2018-11-25 DIAGNOSIS — R531 Weakness: Secondary | ICD-10-CM | POA: Diagnosis not present

## 2018-11-25 DIAGNOSIS — G35 Multiple sclerosis: Secondary | ICD-10-CM | POA: Diagnosis not present

## 2018-11-25 DIAGNOSIS — Z79899 Other long term (current) drug therapy: Secondary | ICD-10-CM | POA: Diagnosis not present

## 2018-11-26 DIAGNOSIS — G35 Multiple sclerosis: Secondary | ICD-10-CM | POA: Diagnosis not present

## 2018-11-26 DIAGNOSIS — Z7401 Bed confinement status: Secondary | ICD-10-CM | POA: Diagnosis not present

## 2018-11-26 DIAGNOSIS — K5901 Slow transit constipation: Secondary | ICD-10-CM | POA: Diagnosis not present

## 2018-11-26 DIAGNOSIS — R030 Elevated blood-pressure reading, without diagnosis of hypertension: Secondary | ICD-10-CM | POA: Diagnosis not present

## 2018-11-26 DIAGNOSIS — M62830 Muscle spasm of back: Secondary | ICD-10-CM | POA: Diagnosis not present

## 2018-11-26 DIAGNOSIS — Z4800 Encounter for change or removal of nonsurgical wound dressing: Secondary | ICD-10-CM | POA: Diagnosis not present

## 2018-11-26 DIAGNOSIS — L89313 Pressure ulcer of right buttock, stage 3: Secondary | ICD-10-CM | POA: Diagnosis not present

## 2018-11-26 DIAGNOSIS — R1312 Dysphagia, oropharyngeal phase: Secondary | ICD-10-CM | POA: Diagnosis not present

## 2018-11-26 DIAGNOSIS — F419 Anxiety disorder, unspecified: Secondary | ICD-10-CM | POA: Diagnosis not present

## 2018-12-01 DIAGNOSIS — F419 Anxiety disorder, unspecified: Secondary | ICD-10-CM | POA: Diagnosis not present

## 2018-12-01 DIAGNOSIS — Z4800 Encounter for change or removal of nonsurgical wound dressing: Secondary | ICD-10-CM | POA: Diagnosis not present

## 2018-12-01 DIAGNOSIS — G8220 Paraplegia, unspecified: Secondary | ICD-10-CM | POA: Diagnosis not present

## 2018-12-01 DIAGNOSIS — G35 Multiple sclerosis: Secondary | ICD-10-CM | POA: Diagnosis not present

## 2018-12-01 DIAGNOSIS — L89313 Pressure ulcer of right buttock, stage 3: Secondary | ICD-10-CM | POA: Diagnosis not present

## 2018-12-01 DIAGNOSIS — Z7401 Bed confinement status: Secondary | ICD-10-CM | POA: Diagnosis not present

## 2018-12-01 DIAGNOSIS — M62830 Muscle spasm of back: Secondary | ICD-10-CM | POA: Diagnosis not present

## 2018-12-01 DIAGNOSIS — R030 Elevated blood-pressure reading, without diagnosis of hypertension: Secondary | ICD-10-CM | POA: Diagnosis not present

## 2018-12-01 DIAGNOSIS — R1312 Dysphagia, oropharyngeal phase: Secondary | ICD-10-CM | POA: Diagnosis not present

## 2018-12-01 DIAGNOSIS — K5901 Slow transit constipation: Secondary | ICD-10-CM | POA: Diagnosis not present

## 2018-12-04 DIAGNOSIS — R030 Elevated blood-pressure reading, without diagnosis of hypertension: Secondary | ICD-10-CM | POA: Diagnosis not present

## 2018-12-04 DIAGNOSIS — L89313 Pressure ulcer of right buttock, stage 3: Secondary | ICD-10-CM | POA: Diagnosis not present

## 2018-12-04 DIAGNOSIS — G35 Multiple sclerosis: Secondary | ICD-10-CM | POA: Diagnosis not present

## 2018-12-04 DIAGNOSIS — F419 Anxiety disorder, unspecified: Secondary | ICD-10-CM | POA: Diagnosis not present

## 2018-12-04 DIAGNOSIS — M62830 Muscle spasm of back: Secondary | ICD-10-CM | POA: Diagnosis not present

## 2018-12-04 DIAGNOSIS — Z7401 Bed confinement status: Secondary | ICD-10-CM | POA: Diagnosis not present

## 2018-12-04 DIAGNOSIS — Z4800 Encounter for change or removal of nonsurgical wound dressing: Secondary | ICD-10-CM | POA: Diagnosis not present

## 2018-12-04 DIAGNOSIS — K5901 Slow transit constipation: Secondary | ICD-10-CM | POA: Diagnosis not present

## 2018-12-04 DIAGNOSIS — R1312 Dysphagia, oropharyngeal phase: Secondary | ICD-10-CM | POA: Diagnosis not present

## 2018-12-08 DIAGNOSIS — L89313 Pressure ulcer of right buttock, stage 3: Secondary | ICD-10-CM | POA: Diagnosis not present

## 2018-12-08 DIAGNOSIS — Z7401 Bed confinement status: Secondary | ICD-10-CM | POA: Diagnosis not present

## 2018-12-08 DIAGNOSIS — Z4800 Encounter for change or removal of nonsurgical wound dressing: Secondary | ICD-10-CM | POA: Diagnosis not present

## 2018-12-08 DIAGNOSIS — M62830 Muscle spasm of back: Secondary | ICD-10-CM | POA: Diagnosis not present

## 2018-12-08 DIAGNOSIS — G35 Multiple sclerosis: Secondary | ICD-10-CM | POA: Diagnosis not present

## 2018-12-08 DIAGNOSIS — K5901 Slow transit constipation: Secondary | ICD-10-CM | POA: Diagnosis not present

## 2018-12-08 DIAGNOSIS — R030 Elevated blood-pressure reading, without diagnosis of hypertension: Secondary | ICD-10-CM | POA: Diagnosis not present

## 2018-12-08 DIAGNOSIS — R1312 Dysphagia, oropharyngeal phase: Secondary | ICD-10-CM | POA: Diagnosis not present

## 2018-12-08 DIAGNOSIS — F419 Anxiety disorder, unspecified: Secondary | ICD-10-CM | POA: Diagnosis not present

## 2018-12-11 DIAGNOSIS — R1312 Dysphagia, oropharyngeal phase: Secondary | ICD-10-CM | POA: Diagnosis not present

## 2018-12-11 DIAGNOSIS — Z7401 Bed confinement status: Secondary | ICD-10-CM | POA: Diagnosis not present

## 2018-12-11 DIAGNOSIS — M62830 Muscle spasm of back: Secondary | ICD-10-CM | POA: Diagnosis not present

## 2018-12-11 DIAGNOSIS — R030 Elevated blood-pressure reading, without diagnosis of hypertension: Secondary | ICD-10-CM | POA: Diagnosis not present

## 2018-12-11 DIAGNOSIS — G35 Multiple sclerosis: Secondary | ICD-10-CM | POA: Diagnosis not present

## 2018-12-11 DIAGNOSIS — Z4800 Encounter for change or removal of nonsurgical wound dressing: Secondary | ICD-10-CM | POA: Diagnosis not present

## 2018-12-11 DIAGNOSIS — F419 Anxiety disorder, unspecified: Secondary | ICD-10-CM | POA: Diagnosis not present

## 2018-12-11 DIAGNOSIS — K5901 Slow transit constipation: Secondary | ICD-10-CM | POA: Diagnosis not present

## 2018-12-11 DIAGNOSIS — L89313 Pressure ulcer of right buttock, stage 3: Secondary | ICD-10-CM | POA: Diagnosis not present

## 2018-12-15 DIAGNOSIS — Z4800 Encounter for change or removal of nonsurgical wound dressing: Secondary | ICD-10-CM | POA: Diagnosis not present

## 2018-12-15 DIAGNOSIS — K5901 Slow transit constipation: Secondary | ICD-10-CM | POA: Diagnosis not present

## 2018-12-15 DIAGNOSIS — F419 Anxiety disorder, unspecified: Secondary | ICD-10-CM | POA: Diagnosis not present

## 2018-12-15 DIAGNOSIS — R1312 Dysphagia, oropharyngeal phase: Secondary | ICD-10-CM | POA: Diagnosis not present

## 2018-12-15 DIAGNOSIS — L89313 Pressure ulcer of right buttock, stage 3: Secondary | ICD-10-CM | POA: Diagnosis not present

## 2018-12-15 DIAGNOSIS — M62830 Muscle spasm of back: Secondary | ICD-10-CM | POA: Diagnosis not present

## 2018-12-15 DIAGNOSIS — Z7401 Bed confinement status: Secondary | ICD-10-CM | POA: Diagnosis not present

## 2018-12-15 DIAGNOSIS — R030 Elevated blood-pressure reading, without diagnosis of hypertension: Secondary | ICD-10-CM | POA: Diagnosis not present

## 2018-12-15 DIAGNOSIS — G35 Multiple sclerosis: Secondary | ICD-10-CM | POA: Diagnosis not present

## 2018-12-18 DIAGNOSIS — Z7401 Bed confinement status: Secondary | ICD-10-CM | POA: Diagnosis not present

## 2018-12-18 DIAGNOSIS — K5901 Slow transit constipation: Secondary | ICD-10-CM | POA: Diagnosis not present

## 2018-12-18 DIAGNOSIS — R030 Elevated blood-pressure reading, without diagnosis of hypertension: Secondary | ICD-10-CM | POA: Diagnosis not present

## 2018-12-18 DIAGNOSIS — G35 Multiple sclerosis: Secondary | ICD-10-CM | POA: Diagnosis not present

## 2018-12-18 DIAGNOSIS — L89313 Pressure ulcer of right buttock, stage 3: Secondary | ICD-10-CM | POA: Diagnosis not present

## 2018-12-18 DIAGNOSIS — Z4800 Encounter for change or removal of nonsurgical wound dressing: Secondary | ICD-10-CM | POA: Diagnosis not present

## 2018-12-18 DIAGNOSIS — R1312 Dysphagia, oropharyngeal phase: Secondary | ICD-10-CM | POA: Diagnosis not present

## 2018-12-18 DIAGNOSIS — M62830 Muscle spasm of back: Secondary | ICD-10-CM | POA: Diagnosis not present

## 2018-12-18 DIAGNOSIS — F419 Anxiety disorder, unspecified: Secondary | ICD-10-CM | POA: Diagnosis not present

## 2018-12-22 DIAGNOSIS — M62830 Muscle spasm of back: Secondary | ICD-10-CM | POA: Diagnosis not present

## 2018-12-22 DIAGNOSIS — R030 Elevated blood-pressure reading, without diagnosis of hypertension: Secondary | ICD-10-CM | POA: Diagnosis not present

## 2018-12-22 DIAGNOSIS — Z4800 Encounter for change or removal of nonsurgical wound dressing: Secondary | ICD-10-CM | POA: Diagnosis not present

## 2018-12-22 DIAGNOSIS — F419 Anxiety disorder, unspecified: Secondary | ICD-10-CM | POA: Diagnosis not present

## 2018-12-22 DIAGNOSIS — K5901 Slow transit constipation: Secondary | ICD-10-CM | POA: Diagnosis not present

## 2018-12-22 DIAGNOSIS — R1312 Dysphagia, oropharyngeal phase: Secondary | ICD-10-CM | POA: Diagnosis not present

## 2018-12-22 DIAGNOSIS — L89313 Pressure ulcer of right buttock, stage 3: Secondary | ICD-10-CM | POA: Diagnosis not present

## 2018-12-22 DIAGNOSIS — Z7401 Bed confinement status: Secondary | ICD-10-CM | POA: Diagnosis not present

## 2018-12-22 DIAGNOSIS — G35 Multiple sclerosis: Secondary | ICD-10-CM | POA: Diagnosis not present

## 2018-12-25 DIAGNOSIS — Z7401 Bed confinement status: Secondary | ICD-10-CM | POA: Diagnosis not present

## 2018-12-25 DIAGNOSIS — L89313 Pressure ulcer of right buttock, stage 3: Secondary | ICD-10-CM | POA: Diagnosis not present

## 2018-12-25 DIAGNOSIS — K5901 Slow transit constipation: Secondary | ICD-10-CM | POA: Diagnosis not present

## 2018-12-25 DIAGNOSIS — R030 Elevated blood-pressure reading, without diagnosis of hypertension: Secondary | ICD-10-CM | POA: Diagnosis not present

## 2018-12-25 DIAGNOSIS — Z4800 Encounter for change or removal of nonsurgical wound dressing: Secondary | ICD-10-CM | POA: Diagnosis not present

## 2018-12-25 DIAGNOSIS — G35 Multiple sclerosis: Secondary | ICD-10-CM | POA: Diagnosis not present

## 2018-12-25 DIAGNOSIS — F419 Anxiety disorder, unspecified: Secondary | ICD-10-CM | POA: Diagnosis not present

## 2018-12-25 DIAGNOSIS — R1312 Dysphagia, oropharyngeal phase: Secondary | ICD-10-CM | POA: Diagnosis not present

## 2018-12-25 DIAGNOSIS — M62830 Muscle spasm of back: Secondary | ICD-10-CM | POA: Diagnosis not present

## 2018-12-31 DIAGNOSIS — G8220 Paraplegia, unspecified: Secondary | ICD-10-CM | POA: Diagnosis not present

## 2018-12-31 DIAGNOSIS — G35 Multiple sclerosis: Secondary | ICD-10-CM | POA: Diagnosis not present

## 2019-01-01 DIAGNOSIS — F419 Anxiety disorder, unspecified: Secondary | ICD-10-CM | POA: Diagnosis not present

## 2019-01-01 DIAGNOSIS — K5901 Slow transit constipation: Secondary | ICD-10-CM | POA: Diagnosis not present

## 2019-01-01 DIAGNOSIS — L89313 Pressure ulcer of right buttock, stage 3: Secondary | ICD-10-CM | POA: Diagnosis not present

## 2019-01-01 DIAGNOSIS — Z4800 Encounter for change or removal of nonsurgical wound dressing: Secondary | ICD-10-CM | POA: Diagnosis not present

## 2019-01-01 DIAGNOSIS — M62830 Muscle spasm of back: Secondary | ICD-10-CM | POA: Diagnosis not present

## 2019-01-01 DIAGNOSIS — G35 Multiple sclerosis: Secondary | ICD-10-CM | POA: Diagnosis not present

## 2019-01-01 DIAGNOSIS — R030 Elevated blood-pressure reading, without diagnosis of hypertension: Secondary | ICD-10-CM | POA: Diagnosis not present

## 2019-01-01 DIAGNOSIS — R1312 Dysphagia, oropharyngeal phase: Secondary | ICD-10-CM | POA: Diagnosis not present

## 2019-01-01 DIAGNOSIS — Z7401 Bed confinement status: Secondary | ICD-10-CM | POA: Diagnosis not present

## 2019-01-05 DIAGNOSIS — R030 Elevated blood-pressure reading, without diagnosis of hypertension: Secondary | ICD-10-CM | POA: Diagnosis not present

## 2019-01-05 DIAGNOSIS — Z7401 Bed confinement status: Secondary | ICD-10-CM | POA: Diagnosis not present

## 2019-01-05 DIAGNOSIS — M62830 Muscle spasm of back: Secondary | ICD-10-CM | POA: Diagnosis not present

## 2019-01-05 DIAGNOSIS — R1312 Dysphagia, oropharyngeal phase: Secondary | ICD-10-CM | POA: Diagnosis not present

## 2019-01-05 DIAGNOSIS — K5901 Slow transit constipation: Secondary | ICD-10-CM | POA: Diagnosis not present

## 2019-01-05 DIAGNOSIS — L89313 Pressure ulcer of right buttock, stage 3: Secondary | ICD-10-CM | POA: Diagnosis not present

## 2019-01-05 DIAGNOSIS — Z4800 Encounter for change or removal of nonsurgical wound dressing: Secondary | ICD-10-CM | POA: Diagnosis not present

## 2019-01-05 DIAGNOSIS — G35 Multiple sclerosis: Secondary | ICD-10-CM | POA: Diagnosis not present

## 2019-01-05 DIAGNOSIS — F419 Anxiety disorder, unspecified: Secondary | ICD-10-CM | POA: Diagnosis not present

## 2019-01-08 DIAGNOSIS — L89313 Pressure ulcer of right buttock, stage 3: Secondary | ICD-10-CM | POA: Diagnosis not present

## 2019-01-08 DIAGNOSIS — R030 Elevated blood-pressure reading, without diagnosis of hypertension: Secondary | ICD-10-CM | POA: Diagnosis not present

## 2019-01-08 DIAGNOSIS — R1312 Dysphagia, oropharyngeal phase: Secondary | ICD-10-CM | POA: Diagnosis not present

## 2019-01-08 DIAGNOSIS — F419 Anxiety disorder, unspecified: Secondary | ICD-10-CM | POA: Diagnosis not present

## 2019-01-08 DIAGNOSIS — K5901 Slow transit constipation: Secondary | ICD-10-CM | POA: Diagnosis not present

## 2019-01-08 DIAGNOSIS — G35 Multiple sclerosis: Secondary | ICD-10-CM | POA: Diagnosis not present

## 2019-01-08 DIAGNOSIS — Z7401 Bed confinement status: Secondary | ICD-10-CM | POA: Diagnosis not present

## 2019-01-08 DIAGNOSIS — M62830 Muscle spasm of back: Secondary | ICD-10-CM | POA: Diagnosis not present

## 2019-01-08 DIAGNOSIS — Z4800 Encounter for change or removal of nonsurgical wound dressing: Secondary | ICD-10-CM | POA: Diagnosis not present

## 2019-01-13 DIAGNOSIS — M62830 Muscle spasm of back: Secondary | ICD-10-CM | POA: Diagnosis not present

## 2019-01-13 DIAGNOSIS — L89313 Pressure ulcer of right buttock, stage 3: Secondary | ICD-10-CM | POA: Diagnosis not present

## 2019-01-13 DIAGNOSIS — Z4800 Encounter for change or removal of nonsurgical wound dressing: Secondary | ICD-10-CM | POA: Diagnosis not present

## 2019-01-13 DIAGNOSIS — K5901 Slow transit constipation: Secondary | ICD-10-CM | POA: Diagnosis not present

## 2019-01-13 DIAGNOSIS — G35 Multiple sclerosis: Secondary | ICD-10-CM | POA: Diagnosis not present

## 2019-01-13 DIAGNOSIS — R030 Elevated blood-pressure reading, without diagnosis of hypertension: Secondary | ICD-10-CM | POA: Diagnosis not present

## 2019-01-13 DIAGNOSIS — Z7401 Bed confinement status: Secondary | ICD-10-CM | POA: Diagnosis not present

## 2019-01-13 DIAGNOSIS — F419 Anxiety disorder, unspecified: Secondary | ICD-10-CM | POA: Diagnosis not present

## 2019-01-13 DIAGNOSIS — R1312 Dysphagia, oropharyngeal phase: Secondary | ICD-10-CM | POA: Diagnosis not present

## 2019-01-14 DIAGNOSIS — Z4800 Encounter for change or removal of nonsurgical wound dressing: Secondary | ICD-10-CM | POA: Diagnosis not present

## 2019-01-14 DIAGNOSIS — G35 Multiple sclerosis: Secondary | ICD-10-CM | POA: Diagnosis not present

## 2019-01-14 DIAGNOSIS — L89313 Pressure ulcer of right buttock, stage 3: Secondary | ICD-10-CM | POA: Diagnosis not present

## 2019-01-15 DIAGNOSIS — K5901 Slow transit constipation: Secondary | ICD-10-CM | POA: Diagnosis not present

## 2019-01-15 DIAGNOSIS — R1312 Dysphagia, oropharyngeal phase: Secondary | ICD-10-CM | POA: Diagnosis not present

## 2019-01-15 DIAGNOSIS — R030 Elevated blood-pressure reading, without diagnosis of hypertension: Secondary | ICD-10-CM | POA: Diagnosis not present

## 2019-01-15 DIAGNOSIS — L89313 Pressure ulcer of right buttock, stage 3: Secondary | ICD-10-CM | POA: Diagnosis not present

## 2019-01-15 DIAGNOSIS — M62830 Muscle spasm of back: Secondary | ICD-10-CM | POA: Diagnosis not present

## 2019-01-15 DIAGNOSIS — Z7401 Bed confinement status: Secondary | ICD-10-CM | POA: Diagnosis not present

## 2019-01-15 DIAGNOSIS — Z4800 Encounter for change or removal of nonsurgical wound dressing: Secondary | ICD-10-CM | POA: Diagnosis not present

## 2019-01-15 DIAGNOSIS — F419 Anxiety disorder, unspecified: Secondary | ICD-10-CM | POA: Diagnosis not present

## 2019-01-15 DIAGNOSIS — G35 Multiple sclerosis: Secondary | ICD-10-CM | POA: Diagnosis not present

## 2019-01-18 DIAGNOSIS — Z4800 Encounter for change or removal of nonsurgical wound dressing: Secondary | ICD-10-CM | POA: Diagnosis not present

## 2019-01-18 DIAGNOSIS — F419 Anxiety disorder, unspecified: Secondary | ICD-10-CM | POA: Diagnosis not present

## 2019-01-18 DIAGNOSIS — G35 Multiple sclerosis: Secondary | ICD-10-CM | POA: Diagnosis not present

## 2019-01-18 DIAGNOSIS — R1312 Dysphagia, oropharyngeal phase: Secondary | ICD-10-CM | POA: Diagnosis not present

## 2019-01-18 DIAGNOSIS — M62830 Muscle spasm of back: Secondary | ICD-10-CM | POA: Diagnosis not present

## 2019-01-18 DIAGNOSIS — R030 Elevated blood-pressure reading, without diagnosis of hypertension: Secondary | ICD-10-CM | POA: Diagnosis not present

## 2019-01-18 DIAGNOSIS — L89313 Pressure ulcer of right buttock, stage 3: Secondary | ICD-10-CM | POA: Diagnosis not present

## 2019-01-18 DIAGNOSIS — Z7401 Bed confinement status: Secondary | ICD-10-CM | POA: Diagnosis not present

## 2019-01-18 DIAGNOSIS — K5901 Slow transit constipation: Secondary | ICD-10-CM | POA: Diagnosis not present

## 2019-01-20 DIAGNOSIS — Z7401 Bed confinement status: Secondary | ICD-10-CM | POA: Diagnosis not present

## 2019-01-20 DIAGNOSIS — K5901 Slow transit constipation: Secondary | ICD-10-CM | POA: Diagnosis not present

## 2019-01-20 DIAGNOSIS — G35 Multiple sclerosis: Secondary | ICD-10-CM | POA: Diagnosis not present

## 2019-01-20 DIAGNOSIS — R1312 Dysphagia, oropharyngeal phase: Secondary | ICD-10-CM | POA: Diagnosis not present

## 2019-01-20 DIAGNOSIS — L89313 Pressure ulcer of right buttock, stage 3: Secondary | ICD-10-CM | POA: Diagnosis not present

## 2019-01-20 DIAGNOSIS — R030 Elevated blood-pressure reading, without diagnosis of hypertension: Secondary | ICD-10-CM | POA: Diagnosis not present

## 2019-01-20 DIAGNOSIS — Z4800 Encounter for change or removal of nonsurgical wound dressing: Secondary | ICD-10-CM | POA: Diagnosis not present

## 2019-01-20 DIAGNOSIS — F419 Anxiety disorder, unspecified: Secondary | ICD-10-CM | POA: Diagnosis not present

## 2019-01-20 DIAGNOSIS — M62830 Muscle spasm of back: Secondary | ICD-10-CM | POA: Diagnosis not present

## 2019-01-22 DIAGNOSIS — G35 Multiple sclerosis: Secondary | ICD-10-CM | POA: Diagnosis not present

## 2019-01-22 DIAGNOSIS — F419 Anxiety disorder, unspecified: Secondary | ICD-10-CM | POA: Diagnosis not present

## 2019-01-22 DIAGNOSIS — R1312 Dysphagia, oropharyngeal phase: Secondary | ICD-10-CM | POA: Diagnosis not present

## 2019-01-22 DIAGNOSIS — Z4800 Encounter for change or removal of nonsurgical wound dressing: Secondary | ICD-10-CM | POA: Diagnosis not present

## 2019-01-22 DIAGNOSIS — M62830 Muscle spasm of back: Secondary | ICD-10-CM | POA: Diagnosis not present

## 2019-01-22 DIAGNOSIS — K5901 Slow transit constipation: Secondary | ICD-10-CM | POA: Diagnosis not present

## 2019-01-22 DIAGNOSIS — L89313 Pressure ulcer of right buttock, stage 3: Secondary | ICD-10-CM | POA: Diagnosis not present

## 2019-01-22 DIAGNOSIS — R030 Elevated blood-pressure reading, without diagnosis of hypertension: Secondary | ICD-10-CM | POA: Diagnosis not present

## 2019-01-22 DIAGNOSIS — Z7401 Bed confinement status: Secondary | ICD-10-CM | POA: Diagnosis not present

## 2019-01-25 ENCOUNTER — Inpatient Hospital Stay (HOSPITAL_COMMUNITY)
Admission: EM | Admit: 2019-01-25 | Discharge: 2019-02-02 | DRG: 539 | Disposition: A | Discharge status: Home Health | Payer: Medicare PPO | Attending: Internal Medicine | Admitting: Internal Medicine

## 2019-01-25 ENCOUNTER — Other Ambulatory Visit: Payer: Self-pay

## 2019-01-25 DIAGNOSIS — Z7401 Bed confinement status: Secondary | ICD-10-CM | POA: Diagnosis not present

## 2019-01-25 DIAGNOSIS — M2550 Pain in unspecified joint: Secondary | ICD-10-CM | POA: Diagnosis not present

## 2019-01-25 DIAGNOSIS — M4628 Osteomyelitis of vertebra, sacral and sacrococcygeal region: Secondary | ICD-10-CM | POA: Diagnosis not present

## 2019-01-25 DIAGNOSIS — M1990 Unspecified osteoarthritis, unspecified site: Secondary | ICD-10-CM | POA: Diagnosis present

## 2019-01-25 DIAGNOSIS — F329 Major depressive disorder, single episode, unspecified: Secondary | ICD-10-CM | POA: Diagnosis present

## 2019-01-25 DIAGNOSIS — G8220 Paraplegia, unspecified: Secondary | ICD-10-CM | POA: Diagnosis not present

## 2019-01-25 DIAGNOSIS — Z20828 Contact with and (suspected) exposure to other viral communicable diseases: Secondary | ICD-10-CM | POA: Diagnosis present

## 2019-01-25 DIAGNOSIS — Z7982 Long term (current) use of aspirin: Secondary | ICD-10-CM

## 2019-01-25 DIAGNOSIS — Z03818 Encounter for observation for suspected exposure to other biological agents ruled out: Secondary | ICD-10-CM | POA: Diagnosis not present

## 2019-01-25 DIAGNOSIS — Z6841 Body Mass Index (BMI) 40.0 and over, adult: Secondary | ICD-10-CM | POA: Diagnosis not present

## 2019-01-25 DIAGNOSIS — F419 Anxiety disorder, unspecified: Secondary | ICD-10-CM | POA: Diagnosis not present

## 2019-01-25 DIAGNOSIS — L8994 Pressure ulcer of unspecified site, stage 4: Secondary | ICD-10-CM

## 2019-01-25 DIAGNOSIS — R1312 Dysphagia, oropharyngeal phase: Secondary | ICD-10-CM | POA: Diagnosis not present

## 2019-01-25 DIAGNOSIS — Z79899 Other long term (current) drug therapy: Secondary | ICD-10-CM

## 2019-01-25 DIAGNOSIS — L8995 Pressure ulcer of unspecified site, unstageable: Secondary | ICD-10-CM | POA: Diagnosis not present

## 2019-01-25 DIAGNOSIS — I10 Essential (primary) hypertension: Secondary | ICD-10-CM | POA: Diagnosis present

## 2019-01-25 DIAGNOSIS — R32 Unspecified urinary incontinence: Secondary | ICD-10-CM | POA: Diagnosis present

## 2019-01-25 DIAGNOSIS — F29 Unspecified psychosis not due to a substance or known physiological condition: Secondary | ICD-10-CM | POA: Diagnosis not present

## 2019-01-25 DIAGNOSIS — R52 Pain, unspecified: Secondary | ICD-10-CM | POA: Diagnosis not present

## 2019-01-25 DIAGNOSIS — G35 Multiple sclerosis: Secondary | ICD-10-CM | POA: Diagnosis present

## 2019-01-25 DIAGNOSIS — K5901 Slow transit constipation: Secondary | ICD-10-CM | POA: Diagnosis not present

## 2019-01-25 DIAGNOSIS — L8990 Pressure ulcer of unspecified site, unspecified stage: Secondary | ICD-10-CM | POA: Insufficient documentation

## 2019-01-25 DIAGNOSIS — L89313 Pressure ulcer of right buttock, stage 3: Secondary | ICD-10-CM | POA: Diagnosis not present

## 2019-01-25 DIAGNOSIS — M533 Sacrococcygeal disorders, not elsewhere classified: Secondary | ICD-10-CM | POA: Diagnosis present

## 2019-01-25 DIAGNOSIS — L03317 Cellulitis of buttock: Secondary | ICD-10-CM | POA: Diagnosis not present

## 2019-01-25 DIAGNOSIS — R000 Tachycardia, unspecified: Secondary | ICD-10-CM | POA: Diagnosis present

## 2019-01-25 DIAGNOSIS — R030 Elevated blood-pressure reading, without diagnosis of hypertension: Secondary | ICD-10-CM | POA: Diagnosis not present

## 2019-01-25 DIAGNOSIS — L89154 Pressure ulcer of sacral region, stage 4: Secondary | ICD-10-CM

## 2019-01-25 DIAGNOSIS — L89159 Pressure ulcer of sacral region, unspecified stage: Secondary | ICD-10-CM | POA: Diagnosis not present

## 2019-01-25 DIAGNOSIS — F17290 Nicotine dependence, other tobacco product, uncomplicated: Secondary | ICD-10-CM | POA: Diagnosis present

## 2019-01-25 DIAGNOSIS — E876 Hypokalemia: Secondary | ICD-10-CM | POA: Diagnosis present

## 2019-01-25 DIAGNOSIS — Z4800 Encounter for change or removal of nonsurgical wound dressing: Secondary | ICD-10-CM | POA: Diagnosis not present

## 2019-01-25 DIAGNOSIS — L0390 Cellulitis, unspecified: Secondary | ICD-10-CM | POA: Diagnosis not present

## 2019-01-25 DIAGNOSIS — M62830 Muscle spasm of back: Secondary | ICD-10-CM | POA: Diagnosis not present

## 2019-01-25 DIAGNOSIS — Z792 Long term (current) use of antibiotics: Secondary | ICD-10-CM

## 2019-01-25 LAB — CBC WITH DIFFERENTIAL/PLATELET
Abs Immature Granulocytes: 0.07 10*3/uL (ref 0.00–0.07)
Basophils Absolute: 0 10*3/uL (ref 0.0–0.1)
Basophils Relative: 0 %
Eosinophils Absolute: 0.1 10*3/uL (ref 0.0–0.5)
Eosinophils Relative: 1 %
HCT: 43.6 % (ref 36.0–46.0)
Hemoglobin: 13.8 g/dL (ref 12.0–15.0)
Immature Granulocytes: 1 %
Lymphocytes Relative: 25 %
Lymphs Abs: 2.8 10*3/uL (ref 0.7–4.0)
MCH: 31.5 pg (ref 26.0–34.0)
MCHC: 31.7 g/dL (ref 30.0–36.0)
MCV: 99.5 fL (ref 80.0–100.0)
Monocytes Absolute: 1 10*3/uL (ref 0.1–1.0)
Monocytes Relative: 9 %
Neutro Abs: 7.2 10*3/uL (ref 1.7–7.7)
Neutrophils Relative %: 64 %
Platelets: 291 10*3/uL (ref 150–400)
RBC: 4.38 MIL/uL (ref 3.87–5.11)
RDW: 13.3 % (ref 11.5–15.5)
WBC: 11.3 10*3/uL — ABNORMAL HIGH (ref 4.0–10.5)
nRBC: 0 % (ref 0.0–0.2)

## 2019-01-25 LAB — COMPREHENSIVE METABOLIC PANEL
ALT: 15 U/L (ref 0–44)
AST: 37 U/L (ref 15–41)
Albumin: 2.4 g/dL — ABNORMAL LOW (ref 3.5–5.0)
Alkaline Phosphatase: 112 U/L (ref 38–126)
Anion gap: 13 (ref 5–15)
BUN: 15 mg/dL (ref 6–20)
CO2: 25 mmol/L (ref 22–32)
Calcium: 8.9 mg/dL (ref 8.9–10.3)
Chloride: 100 mmol/L (ref 98–111)
Creatinine, Ser: 0.91 mg/dL (ref 0.44–1.00)
GFR calc Af Amer: >60 mL/min (ref >60)
GFR calc non Af Amer: >60 mL/min (ref >60)
Glucose, Bld: 120 mg/dL — ABNORMAL HIGH (ref 70–99)
Potassium: 3.2 mmol/L — ABNORMAL LOW (ref 3.5–5.1)
Sodium: 138 mmol/L (ref 135–145)
Total Bilirubin: 0.5 mg/dL (ref 0.3–1.2)
Total Protein: 7.3 g/dL (ref 6.5–8.1)

## 2019-01-25 LAB — LACTIC ACID, PLASMA
Lactic Acid, Venous: 3.3 mmol/L (ref 0.5–1.9)
Lactic Acid, Venous: 2.3 mmol/L (ref 0.5–1.9)

## 2019-01-25 MED ORDER — SODIUM CHLORIDE 0.9 % IV BOLUS
1000.0000 mL | Freq: Once | INTRAVENOUS | Status: AC
  Administered 2019-01-25: 1000 mL via INTRAVENOUS

## 2019-01-25 MED ORDER — VANCOMYCIN HCL 10 G IV SOLR
1250.0000 mg | Freq: Two times a day (BID) | INTRAVENOUS | Status: DC
  Administered 2019-01-25: 1250 mg via INTRAVENOUS
  Filled 2019-01-25 (×3): qty 1250

## 2019-01-25 MED ORDER — VANCOMYCIN HCL IN DEXTROSE 1-5 GM/200ML-% IV SOLN: 1000.0000 mg | Freq: Once | INTRAVENOUS | Status: DC

## 2019-01-25 MED ORDER — PIPERACILLIN-TAZOBACTAM 3.375 G IVPB 30 MIN
3.3750 g | Freq: Once | INTRAVENOUS | Status: AC
  Administered 2019-01-25: 3.375 g via INTRAVENOUS
  Filled 2019-01-25: qty 50

## 2019-01-25 MED ORDER — MORPHINE SULFATE (PF) 4 MG/ML IV SOLN
4.0000 mg | Freq: Once | INTRAVENOUS | Status: AC
  Administered 2019-01-25: 4 mg via INTRAVENOUS
  Filled 2019-01-25: qty 1

## 2019-01-25 MED ORDER — PIPERACILLIN-TAZOBACTAM 3.375 G IVPB
3.3750 g | Freq: Three times a day (TID) | INTRAVENOUS | Status: DC
  Administered 2019-01-26 – 2019-01-28 (×6): 3.375 g via INTRAVENOUS
  Filled 2019-01-25 (×9): qty 50

## 2019-01-25 NOTE — ED Triage Notes (Signed)
Per [facility], pt from home with hx of MS and is bed bound, has pressure wound to left buttock area and every time wound RN changes dressing it bleeds profusely x 1 week. VSS. Axox4.

## 2019-01-25 NOTE — ED Provider Notes (Signed)
[place] [facility] EMERGENCY DEPARTMENT Provider Note   CSN: [id number] Arrival date & time: 01/25/19  1627     History   Chief Complaint Chief Complaint  Patient presents with   Wound Infection    HPI CHYANNE KOHUT is a 48 y.o. female hx of multiple sclerosis who is bedbound, known sacral decub ulcer, here with worsening sacral decub ulcer and pain .  Patient has known sacral decub ulcers and has home health nurse that comes 3 times a week to evaluate the wound .  She states that it has become more painful and has a lot of bleeding .  Her home health nurse was concerned that it started to become infected.  She denies any fevers at home. She is not currently on antibiotics. States that she felt weak all over as well      The history is provided by the patient.    Past Medical History:  Diagnosis Date   Arthritis    MS (multiple sclerosis)     Patient Active Problem List   Diagnosis Date Noted   Anxiety 05/13/2013   Essential hypertension, benign 04/25/2013   Encounter for long-term (current) use of other medications 04/25/2013   Constipation 04/09/2013   Displaced bimalleolar fracture of left ankle 03/30/2013   Multiple sclerosis ([place]) 08/25/2012   Chest pain 08/25/2012   Panic attack 08/25/2012    Past Surgical History:  Procedure Laterality Date   NO PAST SURGERIES     ORIF ANKLE FRACTURE Left 03/30/2013   Procedure: OPEN REDUCTION INTERNAL FIXATION (ORIF) LEFT ANKLE FRACTURE;  Surgeon: Wylene Simmer, MD;  Location: [facility];  Service: Orthopedics;  Laterality: Left;     OB History   No obstetric history on file.      Home Medications    Prior to Admission medications   Medication Sig Start Date End Date Taking? Authorizing Provider  aspirin EC 325 MG tablet Take 325 mg by mouth daily.    [provider]  cetirizine (ZYRTEC) 10 MG tablet Take 10 mg by mouth daily.    [provider]  docusate sodium (COLACE) 100 MG  capsule Take 100 mg by mouth 2 (two) times daily.    [provider]  hydrochlorothiazide (HYDRODIURIL) 25 MG tablet Take 25 mg by mouth daily.    [provider]  HYDROcodone-acetaminophen (NORCO/VICODIN) 5-325 MG per tablet Take 1-2 tablets by mouth every 6 (six) hours as needed (for pain).     [provider]  LORazepam (ATIVAN) 0.5 MG tablet Take one tablet by mouth every evening for anxiety 05/13/13   Lauree Chandler, NP  LORazepam (ATIVAN) 1 MG tablet Take 1 mg by mouth daily. In AM    [provider]  methocarbamol (ROBAXIN) 500 MG tablet Take 500 mg by mouth every 6 (six) hours as needed for muscle spasms. For spasms    [provider]  oxyCODONE (OXY IR/ROXICODONE) 5 MG immediate release tablet Take one tablet by mouth every four hours as needed for mild pain; Take two tablets by mouth every four hours as needed for moderate to severe pain 05/25/13   Estill Dooms, MD  polyethylene glycol [facility] / Floria Raveling) packet Take 17 g by mouth daily. Mix with 6-8 oz of liquid daily for constipation.    [provider]  Sennosides-Docusate Sodium (SENOKOT S PO) Take 2 tablets by mouth at bedtime as needed and may repeat dose one time if needed.    [provider]  Family History No family history on file.  Social History Social History   Tobacco Use   Smoking status: Former Smoker    Packs/day: 2.00    Years: 20.00    Pack years: 40.00    Quit date: 07/03/2008    Years since quitting: 10.5   Smokeless tobacco: Current User   Tobacco comment: USES ELECTRONIC CIGARETTES  Substance Use Topics   Alcohol use: No   Drug use: No     Allergies   Patient has no known allergies.   Review of Systems Review of Systems  Skin: Positive for wound.  All other systems reviewed and are negative.    Physical Exam Updated Vital Signs BP 129/67 (BP Location: Right Arm)    Pulse (!) 106    Temp 98.4 F (36.9 C) (Oral)     Resp 16    SpO2 98%   Physical Exam Vitals signs and nursing note reviewed.  Constitutional:      Comments: Chronically ill, bed bound   HENT:     Head: Normocephalic.     Nose: Nose normal.     Mouth/Throat:     Mouth: Mucous membranes are dry.  Eyes:     Extraocular Movements: Extraocular movements intact.     Pupils: Pupils are equal, round, and reactive to light.  Neck:     Musculoskeletal: Normal range of motion.  Cardiovascular:     Rate and Rhythm: Normal rate.     Pulses: Normal pulses.  Pulmonary:     Effort: Pulmonary effort is normal.     Breath sounds: Normal breath sounds.  Abdominal:     General: Abdomen is flat.  Musculoskeletal:     Comments: Rectal- stage 4 sacral decub ulcer with some bloody drainage, + foul smelling odor with surround cellulitis   Skin:    Capillary Refill: Capillary refill takes less than 2 seconds.  Neurological:     General: No focal deficit present.     Mental Status: She is alert.     Comments: bedbound (chronic)   Psychiatric:        Mood and Affect: Mood normal.      ED Treatments / Results  Labs (all labs ordered are listed, but only abnormal results are displayed) Labs Reviewed  LACTIC ACID, PLASMA - Abnormal; Notable for the following components:      Result Value   Lactic Acid, Venous 3.3 (*)    All other components within normal limits  COMPREHENSIVE METABOLIC PANEL - Abnormal; Notable for the following components:   Potassium 3.2 (*)    Glucose, Bld 120 (*)    Albumin 2.4 (*)    All other components within normal limits  CBC WITH DIFFERENTIAL/PLATELET - Abnormal; Notable for the following components:   WBC 11.3 (*)    All other components within normal limits  CULTURE, BLOOD (ROUTINE X 2)  CULTURE, BLOOD (ROUTINE X 2)  LACTIC ACID, PLASMA  I-STAT BETA HCG BLOOD, ED ([facility], [facility], [facility] ONLY)    EKG None  Radiology No results found.  Procedures Procedures (including critical care time)  CRITICAL  CARE Performed by: Richardean Canal   Total critical care time: 30 minutes  Critical care time was exclusive of separately billable procedures and treating other patients.  Critical care was necessary to treat or prevent imminent or life-threatening deterioration.  Critical care was time spent personally by me on the following activities: development of treatment plan with patient and/or surrogate as well as nursing,  discussions with consultants, evaluation of patient's response to treatment, examination of patient, obtaining history from patient or surrogate, ordering and performing treatments and interventions, ordering and review of laboratory studies, ordering and review of radiographic studies, pulse oximetry and re-evaluation of patient's condition.   Medications Ordered in ED Medications  sodium chloride 0.9 % bolus 1,000 mL (has no administration in time range)  morphine 4 MG/ML injection 4 mg (has no administration in time range)  vancomycin (VANCOCIN) IVPB 1000 mg/200 mL premix (has no administration in time range)  piperacillin-tazobactam (ZOSYN) IVPB 3.375 g (has no administration in time range)     Initial Impression / Assessment and Plan / ED Course  I have reviewed the triage vital signs and the nursing notes.  Pertinent labs & imaging results that were available during my care of the patient were reviewed by me and considered in my medical decision making (see chart for details).       MAUNA GLAZEWSKI is a 48 y.o. female hx of MS with worsening sacral decub ulcer. I am concerned that she is septic from the stage 4 decub ulcer. Will do sepsis workup and give IV abx. Will need wound nurse to assess the patient during the hospitalization stay   8:44 PM Lactate 3.3. Ordered vanc/zosyn. Medicine team to admit   Final Clinical Impressions(s) / ED Diagnoses   Final diagnoses:  None    ED Discharge Orders    None       Charlynne Pander, MD 01/25/19 2045

## 2019-01-25 NOTE — Progress Notes (Signed)
Pharmacy Antibiotic Note  [name] is a 48 y.o. female admitted on 01/25/2019 with cellulitis.  Pharmacy has been consulted for Zosyn and Vancomcyin dosing.  Height: 5\' 2"  (157.5 cm) Weight: 250 lb (113.4 kg) IBW/kg (Calculated) : 50.1  Temp (24hrs), Avg:98.4 F (36.9 C), Min:98.4 F (36.9 C), Max:98.4 F (36.9 C)  Recent Labs  Lab 01/25/19 1650 01/25/19 2039  WBC 11.3*  --   CREATININE 0.91  --   LATICACIDVEN 3.3* 2.3*    Estimated Creatinine Clearance: 88 mL/min (by C-G formula based on SCr of 0.91 mg/dL).    No Known Allergies  Antimicrobials this admission: 11/16 Zosyn >>  11/16 Vancomcyin >>   Dose adjustments this admission: n/a  Microbiology results: 11/16 BCx: pending  Assessment: Patient is a bead bound 65 yof that has a stage 4 decubitus ulcer with cellulitis around the ulcer. The admitting team has requested for pharmacy to dose abx at this time in this patient.   Plan: - Zosyn 3.375g IV q8h infused over 4 hours  - With treatment for cellulitis will not give the patient a loading dose of vancomycin. - Will start the patient on Vancomycin 1250mg  IV q12h.  - Est calculated AUC of 466 - Will continue to monitor patients renal function   Thank you for allowing pharmacy to be a part of this patient's care.  Duanne Limerick PharmD. BCPS  01/25/2019 9:27 PM

## 2019-01-25 NOTE — H&P (Signed)
History and Physical        Hospital Admission Note Date: 01/25/2019  Patient name: [name] Medical record number: [id number] Date of birth: Feb 14, 1971 Age: 48 y.o. Gender: female  PCP: Alvester Chou, NP    Patient coming from: Home   I have reviewed all records in the [facility].    Chief Complaint:  Wound infection   HPI: [name] is 48 y.o. female with history of Multiple Sclerosis with a known sacral decubitus ulcer followed by home health 3x per week who presents for evaluation of the wound. Home health RN with concern that wound may be becoming infected. Wound has become more painful and has increased with degree of bleeding for about the past 2 weeks. Ulcer has been present since May. Patient is not on any antibiotics. Denies fevers/chills.  Patient is bed bound. No function of LE bilaterally. She uses air mattress at home.    ED work-up/course:  CBC and lactic acid obtained. Notable for WBC of 11 and LA of 3.3. Blood cultures ordered. Patient started on broad spectrum abx, Vanc and Zosyn. 1L bolus of IVF and 32m of morphine given.   Review of Systems: Positives marked in 'bold' Constitutional: Denies fever, chills, diaphoresis, poor appetite and fatigue.  HEENT: Denies photophobia, eye pain, redness, hearing loss, ear pain, congestion, sore throat, rhinorrhea, sneezing, mouth sores, trouble swallowing, neck pain, neck stiffness and tinnitus.   Respiratory: Denies SOB, DOE, cough, chest tightness,  and wheezing.   Cardiovascular: Denies chest pain, palpitations and leg swelling.  Gastrointestinal: Denies nausea, vomiting, abdominal pain, diarrhea, constipation, blood in stool and abdominal distention.  Genitourinary: Denies dysuria, urgency, frequency, hematuria, flank pain and difficulty urinating.  Musculoskeletal: Denies myalgias, back pain, joint swelling, arthralgias and gait problem.  Skin:  Denies pallor, rash and wound.  Neurological: Denies dizziness, seizures, syncope, weakness, light-headedness, numbness and headaches.  Hematological: Denies adenopathy. Easy bruising, personal or family bleeding history  Psychiatric/Behavioral: Denies suicidal ideation, mood changes, confusion, nervousness, sleep disturbance and agitation  Past Medical History: Past Medical History:  Diagnosis Date  . Arthritis   . MS (multiple sclerosis)     Past Surgical History:  Procedure Laterality Date  . NO PAST SURGERIES    . ORIF ANKLE FRACTURE Left 03/30/2013   Procedure: OPEN REDUCTION INTERNAL FIXATION (ORIF) LEFT ANKLE FRACTURE;  Surgeon: JWylene Simmer MD;  Location: [facility]  Service: Orthopedics;  Laterality: Left;    Medications: Prior to Admission medications   Medication Sig Start Date End Date Taking? Authorizing Provider  aspirin EC 325 MG tablet Take 325 mg by mouth daily.    [provider]  cetirizine (ZYRTEC) 10 MG tablet Take 10 mg by mouth daily.    [provider]  docusate sodium (COLACE) 100 MG capsule Take 100 mg by mouth 2 (two) times daily.    [provider]  hydrochlorothiazide (HYDRODIURIL) 25 MG tablet Take 25 mg by mouth daily.    [provider]  HYDROcodone-acetaminophen (NORCO/VICODIN) 5-325 MG per tablet Take 1-2 tablets by mouth every 6 (six) hours as needed (for pain).     [provider]  LORazepam (ATIVAN) 0.5  MG tablet Take one tablet by mouth every evening for anxiety 05/13/13   Lauree Chandler, NP  LORazepam (ATIVAN) 1 MG tablet Take 1 mg by mouth daily. In AM    [provider]  methocarbamol (ROBAXIN) 500 MG tablet Take 500 mg by mouth every 6 (six) hours as needed for muscle spasms. For spasms    [provider]  oxyCODONE (OXY IR/ROXICODONE) 5 MG immediate release tablet Take one tablet by mouth every four hours as needed for mild pain; Take two tablets by mouth every four hours as needed for  moderate to severe pain 05/25/13   Estill Dooms, MD  polyethylene glycol [facility] / Floria Raveling) packet Take 17 g by mouth daily. Mix with 6-8 oz of liquid daily for constipation.    [provider]  Sennosides-Docusate Sodium (SENOKOT S PO) Take 2 tablets by mouth at bedtime as needed and may repeat dose one time if needed.    [provider]    Allergies:  No Known Allergies  Social History:  reports that she quit smoking about 10 years ago. She has a 40.00 pack-year smoking history. She uses smokeless tobacco. She reports that she does not drink alcohol or use drugs.  Family History: No family history on file.  Physical Exam: Blood pressure 129/67, pulse (!) 106, temperature 98.4 F (36.9 C), temperature source Oral, resp. rate 16, SpO2 98 %. General: Alert, awake, oriented x3, in no acute distress. Appears chronically ill. Is bed bound.  Eyes: pink conjunctiva,anicteric sclera, pupils equal and reactive to light and accomodation, HEENT: normocephalic, atraumatic, oropharynx clear, MMM are dry  Neck: supple, no masses or lymphadenopathy, no goiter, no bruits, no JVD CVS: Regular rate and rhythm, without murmurs, rubs or gallops. No lower extremity edema Resp : Clear to auscultation bilaterally, no wheezing, rales or rhonchi. GI : Soft, nontender, nondistended, positive bowel sounds, no masses. No hepatomegaly. No hernia.  Musculoskeletal: No clubbing or cyanosis, positive pedal pulses. No contracture. ROM intact  Neuro: Grossly intact, no focal neurological deficits, bed bound with limited mobility in LE  Psych: alert and oriented x 3, normal mood and affect Skin: Stage 4 sacral decubitus ulcer with bloody drainage present on the left side. +foul smelling odor. Surrounding skin erythema and increased warmth. TTP. No visible bone present.    LABS on Admission: I have personally reviewed all the labs and imagings below    Basic Metabolic Panel: Recent Labs  Lab  01/25/19 1650  NA 138  K 3.2*  CL 100  CO2 25  GLUCOSE 120*  BUN 15  CREATININE 0.91  CALCIUM 8.9   Liver Function Tests: Recent Labs  Lab 01/25/19 1650  AST 37  ALT 15  ALKPHOS 112  BILITOT 0.5  PROT 7.3  ALBUMIN 2.4*   No results for input(s): LIPASE, AMYLASE in the last 168 hours. No results for input(s): AMMONIA in the last 168 hours. CBC: Recent Labs  Lab 01/25/19 1650  WBC 11.3*  NEUTROABS 7.2  HGB 13.8  HCT 43.6  MCV 99.5  PLT 291   Cardiac Enzymes: No results for input(s): CKTOTAL, CKMB, CKMBINDEX, TROPONINI in the last 168 hours. BNP: Invalid input(s): POCBNP CBG: No results for input(s): GLUCAP in the last 168 hours.  Radiological Exams on Admission:  No results found.   Assessment/Plan Active Problems:  Stage 4 decubitus ulcer (HCC) Cellulitis Known stage 4 sacral decubitus ulcer which appears to have secondary infection with signs of cellulitis. Patient not floridly septic  and bone not visibile. While concern for osteomyelitis still remains, will investigate further prior to treating as such. Pulse mildly tachycardic, vital signs otherwise stable. Lactic acid elevated to 3.3. Mild leukocytosis to 11.3. Patient has received IVF bolus, morphine for pain control and broad spectrum antibiotics.  -Admit to inpatient, vital signs per unit  -Continue Vancomycin and Zosyn  -trend lactic acid  -repeat CBC in AM  -blood cultures pending, will follow  -wound care consult placed, may need debridement  -pain control with Morphine/tylenol/ibuprofen -ESR and CRP ordered, if elevated could consider imaging for osteo -air mattress and nursing to turn patient   Multiple sclerosis [facility]) Patient is bed bound. Reports she is not taking any medications for MS and takes OTC pain medications at home.  -fall precautions  -PT eval   Essential hypertension, benign Patient reports history of HTN related to anxiety. No longer taking HCTZ.  -continue to monitor      DVT prophylaxis: Lovenox  CODE STATUS: FULL   Consults called: None (wound consult placed in EMR)   Family Communication: Admission, patients condition and plan of care including tests being ordered have been discussed with the patient and father and mother, Barnabas Lister and Shadiyah Wernli, who indicates understanding and agree with the plan and Code Status  Admission status:   The medical decision making on this patient was of high complexity and the patient is at high risk for clinical deterioration, therefore this is a level 3 admission.  Severity of Illness:     Moderate  The appropriate patient status for this patient is INPATIENT. Inpatient status is judged to be reasonable and necessary in order to provide the required intensity of service to ensure the patient's safety. The patient's presenting symptoms, physical exam findings, and initial radiographic and laboratory data in the context of their chronic comorbidities is felt to place them at high risk for further clinical deterioration. Furthermore, it is not anticipated that the patient will be medically stable for discharge from the hospital within 2 midnights of admission. The following factors support the patient status of inpatient.   " The patient's presenting symptoms include wound infection. " The worrisome physical exam findings include stage IV decubitus sacral ulcer with signs of secondary infection. " The initial radiographic and laboratory data are worrisome because of Lactic acid 3.3. " The chronic co-morbidities include Multiple Sclerosis.   * I certify that at the point of admission it is my clinical judgment that the patient will require inpatient hospital care spanning beyond 2 midnights from the point of admission due to high intensity of service, high risk for further deterioration and high frequency of surveillance required.*    Time Spent on Admission: 43 minutes    [facility]  [phone] 01/25/2019, 9:09 PM

## 2019-01-25 NOTE — ED Notes (Signed)
Father-jack Sprunger, would like to be called with pt updates-[phone]

## 2019-01-26 ENCOUNTER — Encounter (HOSPITAL_COMMUNITY): Payer: Self-pay

## 2019-01-26 DIAGNOSIS — L03317 Cellulitis of buttock: Secondary | ICD-10-CM

## 2019-01-26 DIAGNOSIS — L89154 Pressure ulcer of sacral region, stage 4: Secondary | ICD-10-CM

## 2019-01-26 DIAGNOSIS — I10 Essential (primary) hypertension: Secondary | ICD-10-CM

## 2019-01-26 DIAGNOSIS — G35 Multiple sclerosis: Secondary | ICD-10-CM

## 2019-01-26 LAB — CBC
HCT: 40.6 % (ref 36.0–46.0)
Hemoglobin: 12.5 g/dL (ref 12.0–15.0)
MCH: 31.5 pg (ref 26.0–34.0)
MCHC: 30.8 g/dL (ref 30.0–36.0)
MCV: 102.3 fL — ABNORMAL HIGH (ref 80.0–100.0)
Platelets: 265 10*3/uL (ref 150–400)
RBC: 3.97 MIL/uL (ref 3.87–5.11)
RDW: 13.3 % (ref 11.5–15.5)
WBC: 9.2 10*3/uL (ref 4.0–10.5)
nRBC: 0 % (ref 0.0–0.2)

## 2019-01-26 LAB — C-REACTIVE PROTEIN: CRP: 13.1 mg/dL — ABNORMAL HIGH (ref <1.0)

## 2019-01-26 LAB — BASIC METABOLIC PANEL
Anion gap: 12 (ref 5–15)
BUN: 10 mg/dL (ref 6–20)
CO2: 23 mmol/L (ref 22–32)
Calcium: 8.3 mg/dL — ABNORMAL LOW (ref 8.9–10.3)
Chloride: 105 mmol/L (ref 98–111)
Creatinine, Ser: 0.62 mg/dL (ref 0.44–1.00)
GFR calc Af Amer: >60 mL/min (ref >60)
GFR calc non Af Amer: >60 mL/min (ref >60)
Glucose, Bld: 127 mg/dL — ABNORMAL HIGH (ref 70–99)
Potassium: 3 mmol/L — ABNORMAL LOW (ref 3.5–5.1)
Sodium: 140 mmol/L (ref 135–145)

## 2019-01-26 LAB — LACTIC ACID, PLASMA: Lactic Acid, Venous: 1.9 mmol/L (ref 0.5–1.9)

## 2019-01-26 LAB — SEDIMENTATION RATE: Sed Rate: 71 mm/hr — ABNORMAL HIGH (ref 0–22)

## 2019-01-26 LAB — SARS CORONAVIRUS 2 (TAT 6-24 HRS): SARS Coronavirus 2: NEGATIVE

## 2019-01-26 LAB — I-STAT BETA HCG BLOOD, ED (NOT ORDERABLE): I-stat hCG, quantitative: <5 m[IU]/mL (ref <5)

## 2019-01-26 LAB — HIV ANTIBODY (ROUTINE TESTING W REFLEX): HIV Screen 4th Generation wRfx: NONREACTIVE — AB

## 2019-01-26 MED ORDER — ENSURE ENLIVE PO LIQD
237.0000 mL | Freq: Two times a day (BID) | ORAL | Status: DC
  Administered 2019-01-26 – 2019-02-02 (×14): 237 mL via ORAL

## 2019-01-26 MED ORDER — GABAPENTIN 600 MG PO TABS: 300.0000 mg | ORAL_TABLET | Freq: Every evening | ORAL | Status: DC | PRN

## 2019-01-26 MED ORDER — POLYETHYLENE GLYCOL 3350 17 G PO PACK: 17.0000 g | PACK | Freq: Every day | ORAL | Status: DC | PRN

## 2019-01-26 MED ORDER — ACETAMINOPHEN 325 MG PO TABS
650.0000 mg | ORAL_TABLET | Freq: Four times a day (QID) | ORAL | Status: DC | PRN
  Administered 2019-01-30 – 2019-02-02 (×4): 650 mg via ORAL
  Filled 2019-01-26 (×4): qty 2

## 2019-01-26 MED ORDER — IBUPROFEN 200 MG PO TABS
600.0000 mg | ORAL_TABLET | Freq: Four times a day (QID) | ORAL | Status: DC
  Administered 2019-01-26 (×3): 600 mg via ORAL
  Filled 2019-01-26: qty 3
  Filled 2019-01-26: qty 1
  Filled 2019-01-26: qty 3

## 2019-01-26 MED ORDER — MORPHINE SULFATE (PF) 4 MG/ML IV SOLN
4.0000 mg | INTRAVENOUS | Status: DC | PRN
  Administered 2019-01-26 – 2019-02-01 (×17): 4 mg via INTRAVENOUS
  Filled 2019-01-26 (×17): qty 1

## 2019-01-26 MED ORDER — GERHARDT'S BUTT CREAM
TOPICAL_CREAM | Freq: Three times a day (TID) | CUTANEOUS | Status: DC
  Administered 2019-01-26 – 2019-02-02 (×21): via TOPICAL
  Filled 2019-01-26: qty 1

## 2019-01-26 MED ORDER — PIPERACILLIN-TAZOBACTAM 3.375 G IVPB 30 MIN
3.3750 g | Freq: Once | INTRAVENOUS | Status: AC
  Administered 2019-01-26: 3.375 g via INTRAVENOUS
  Filled 2019-01-26: qty 50

## 2019-01-26 MED ORDER — VANCOMYCIN HCL 10 G IV SOLR
1750.0000 mg | INTRAVENOUS | Status: DC
  Administered 2019-01-27 – 2019-01-30 (×4): 1750 mg via INTRAVENOUS
  Filled 2019-01-26 (×5): qty 1750

## 2019-01-26 MED ORDER — METHOCARBAMOL 500 MG PO TABS
500.0000 mg | ORAL_TABLET | Freq: Three times a day (TID) | ORAL | Status: DC
  Administered 2019-01-26 – 2019-02-02 (×21): 500 mg via ORAL
  Filled 2019-01-26 (×21): qty 1

## 2019-01-26 MED ORDER — ENOXAPARIN SODIUM 40 MG/0.4ML ~~LOC~~ SOLN
40.0000 mg | SUBCUTANEOUS | Status: DC
  Administered 2019-01-26 (×2): 40 mg via SUBCUTANEOUS
  Filled 2019-01-26 (×2): qty 0.4

## 2019-01-26 MED ORDER — BISACODYL 5 MG PO TBEC
5.0000 mg | DELAYED_RELEASE_TABLET | Freq: Every day | ORAL | Status: DC | PRN
  Administered 2019-01-29: 5 mg via ORAL
  Filled 2019-01-26: qty 1

## 2019-01-26 MED ORDER — SODIUM CHLORIDE 0.9 % IV BOLUS
1000.0000 mL | Freq: Once | INTRAVENOUS | Status: AC
  Administered 2019-01-26: 1000 mL via INTRAVENOUS

## 2019-01-26 MED ORDER — VANCOMYCIN HCL IN DEXTROSE 1-5 GM/200ML-% IV SOLN
1000.0000 mg | Freq: Once | INTRAVENOUS | Status: AC
  Administered 2019-01-26: 1000 mg via INTRAVENOUS
  Filled 2019-01-26: qty 200

## 2019-01-26 MED ORDER — GABAPENTIN 300 MG PO CAPS
300.0000 mg | ORAL_CAPSULE | Freq: Three times a day (TID) | ORAL | Status: DC
  Administered 2019-01-26 – 2019-02-02 (×21): 300 mg via ORAL
  Filled 2019-01-26 (×21): qty 1

## 2019-01-26 MED ORDER — BACLOFEN 10 MG PO TABS
10.0000 mg | ORAL_TABLET | Freq: Three times a day (TID) | ORAL | Status: DC
  Administered 2019-01-26 – 2019-02-02 (×21): 10 mg via ORAL
  Filled 2019-01-26 (×21): qty 1

## 2019-01-26 NOTE — Consult Note (Addendum)
WOC Nurse Consult Note: Reason for Consult: Wound type:Pressure, suspected infection Pressure Injury POA: Yes Measurement:Bedside RN to obtain and document on flow sheet today. There are several partial thickness wounds surrounding the Stage 4 ulceration. Drainage (amount, consistency, odor) serosanguinous Periwound:erythematous, friable edge Dressing procedure/placement/frequency:I have provided conservative care guidance for Nursing (mattress replacement with low air loss feature [already in place], bilateral pressure redistribution heel boots, and topical care using saline moistened gauze as a wound filler with three times daily changes.  I have requested that the dressing be covered with an ABD pad and that securement be with 2-inch paper tape rather than the more adherent flexible cloth tape (Medipore) due to three time daily changes. Turning and repositioning is in place; I have provided guidance for [facility] elevation (below 30 degrees) and to minimize time in the supine position. To manage the patient's urinary incontinence, a PurWick device is being used at this time. Gerhart's Butt cream is ordered for the perineum and other partial thickness skin lesions.  I have communicated with Dr Arbutus Ped and recommended a consultation to [facility] for this patient's wound.  [place] [facility] nursing team will not follow, but will remain available to this patient, the nursing and medical teams.  Please re-consult if needed. Thanks, Maudie Flakes, MSN, RN, [place], Arther Abbott  Pager# [phone]

## 2019-01-26 NOTE — Consult Note (Signed)
[facility] Surgery Consult/Admission Note  [name] 1970-08-11  [id number].    Requesting Provider: Dr. Denton Lank Chief Complaint/Reason for Consult: sacral wound  HPI:   Patient is a 48 yo old female with history of MS and chronic sacral wound who presented to the ED with complaints of increased pain and bleeding from her wound over the past week. Associated foul smell. Patient states she is followed by home health for her wound. She states her case manager recommended a wound vac for her wound. She denies fever chills.  Patient is bed bound.   ROS:  Review of Systems  Constitutional: Negative for chills, diaphoresis and fever.  HENT: Negative for sore throat.   Respiratory: Negative for cough and shortness of breath.   Cardiovascular: Negative for chest pain.  Gastrointestinal: Negative for abdominal pain, blood in stool, constipation, diarrhea, nausea and vomiting.  Genitourinary: Negative for dysuria.  Skin: Negative for rash.       + for sacral wound  Neurological: Negative for dizziness and loss of consciousness.  All other systems reviewed and are negative.    History reviewed. No pertinent family history.  Past Medical History:  Diagnosis Date  . Arthritis   . MS (multiple sclerosis) (HCC)     Past Surgical History:  Procedure Laterality Date  . NO PAST SURGERIES    . ORIF ANKLE FRACTURE Left 03/30/2013   Procedure: OPEN REDUCTION INTERNAL FIXATION (ORIF) LEFT ANKLE FRACTURE;  Surgeon: Toni Arthurs, MD;  Location: [facility] OR;  Service: Orthopedics;  Laterality: Left;    Social History:  reports that she quit smoking about 10 years ago. She has a 40.00 pack-year smoking history. She uses smokeless tobacco. She reports that she does not drink alcohol or use drugs.  Allergies:  Allergies  Allergen Reactions  . Tizanidine Diarrhea    Medications Prior to Admission  Medication Sig Dispense Refill  . acetaminophen (TYLENOL) 500 MG tablet Take 1,000 mg by  mouth every 6 (six) hours.    Marland Kitchen alum & mag hydroxide-simeth (MAALOX/MYLANTA) 200-200-20 MG/5ML suspension Take 15 mLs by mouth every 6 (six) hours as needed for indigestion or heartburn.    . baclofen (LIORESAL) 10 MG tablet Take 10 mg by mouth 3 (three) times daily.    Marland Kitchen gabapentin (NEURONTIN) 300 MG capsule Take 300 mg by mouth See admin instructions. Take one capsule (300 mg) by mouth four times daily, may take an extra capsule (300 mg) at night as needed for pain    . ibuprofen (ADVIL) 200 MG tablet Take 600 mg by mouth every 6 (six) hours as needed (knee pain).    . meloxicam (MOBIC) 7.5 MG tablet Take 7.5 mg by mouth 2 (two) times daily.    . Menthol, Topical Analgesic, (BIOFREEZE EX) Apply 1 application topically 4 (four) times daily as needed (knee pain).    . methocarbamol (ROBAXIN) 500 MG tablet Take 500 mg by mouth 3 (three) times daily. For spasms    . Multiple Vitamins-Minerals (ADULT GUMMY) CHEW Chew 1 tablet by mouth 2 (two) times daily.    Marland Kitchen LORazepam (ATIVAN) 0.5 MG tablet Take one tablet by mouth every evening for anxiety (Patient not taking: Reported on 01/25/2019) 30 tablet 5    Blood pressure 131/74, pulse 98, temperature 98.6 F (37 C), temperature source Oral, resp. rate 20, height 5\' 2"  (1.575 m), weight 113.4 kg, SpO2 100 %.  Physical Exam Vitals signs and nursing note reviewed. Exam conducted with a chaperone present.  Constitutional:  General: She is not in acute distress.    Appearance: She is well-developed. She is not diaphoretic.  HENT:     Head: Normocephalic and atraumatic.     Mouth/Throat:     Lips: Pink.     Mouth: Mucous membranes are moist.  Eyes:     Conjunctiva/sclera: Conjunctivae normal.     Pupils: Pupils are equal, round, and reactive to light.  Neck:     Musculoskeletal: Full passive range of motion without pain and normal range of motion.  Pulmonary:     Effort: Pulmonary effort is normal. No tachypnea, bradypnea or respiratory  distress.  Musculoskeletal:     Right lower leg: No edema.     Left lower leg: No edema.  Skin:    General: Skin is warm and dry.     Comments: Sacral wound see photos below  Neurological:     Mental Status: She is alert and oriented to person, place, and time.     Coordination: Coordination normal.  Psychiatric:        Mood and Affect: Mood normal.        Behavior: Behavior normal.        Results for orders placed or performed during the hospital encounter of 01/25/19 (from the past 48 hour(s))  Lactic acid, plasma     Status: Abnormal   Collection Time: 01/25/19  4:50 PM  Result Value Ref Range   Lactic Acid, Venous 3.3 (HH) 0.5 - 1.9 mmol/L    Comment: CRITICAL RESULT CALLED TO, READ BACK BY AND VERIFIED WITH: K.BROWN,RN [id number] I.MANNING Performed at [facility], [address] [place], [place] [address]   Comprehensive metabolic panel     Status: Abnormal   Collection Time: 01/25/19  4:50 PM  Result Value Ref Range   Sodium 138 135 - 145 mmol/L   Potassium 3.2 (L) 3.5 - 5.1 mmol/L   Chloride 100 98 - 111 mmol/L   CO2 25 22 - 32 mmol/L   Glucose, Bld 120 (H) 70 - 99 mg/dL   BUN 15 6 - 20 mg/dL   Creatinine, Ser 4.780.91 0.44 - 1.00 mg/dL   Calcium 8.9 8.9 - 29.510.3 mg/dL   Total Protein 7.3 6.5 - 8.1 g/dL   Albumin 2.4 (L) 3.5 - 5.0 g/dL   AST 37 15 - 41 U/L   ALT 15 0 - 44 U/L   Alkaline Phosphatase 112 38 - 126 U/L   Total Bilirubin 0.5 0.3 - 1.2 mg/dL   GFR calc non Af Amer >60 >60 mL/min   GFR calc Af Amer >60 >60 mL/min   Anion gap 13 5 - 15    Comment: Performed at [facility], [address] [place], [place] [address]  CBC with Differential     Status: Abnormal   Collection Time: 01/25/19  4:50 PM  Result Value Ref Range   WBC 11.3 (H) 4.0 - 10.5 K/uL   RBC 4.38 3.87 - 5.11 MIL/uL   Hemoglobin 13.8 12.0 - 15.0 g/dL   HCT 86.543.6 78.436.0 - 69.646.0 %   MCV 99.5 80.0 - 100.0 fL   MCH 31.5 26.0 - 34.0 pg   MCHC 31.7 30.0 - 36.0 g/dL   RDW 29.513.3  28.411.5 - 13.215.5 %   Platelets 291 150 - 400 K/uL   nRBC 0.0 0.0 - 0.2 %   Neutrophils Relative % 64 %   Neutro Abs 7.2 1.7 - 7.7 K/uL   Lymphocytes Relative 25 %  Lymphs Abs 2.8 0.7 - 4.0 K/uL   Monocytes Relative 9 %   Monocytes Absolute 1.0 0.1 - 1.0 K/uL   Eosinophils Relative 1 %   Eosinophils Absolute 0.1 0.0 - 0.5 K/uL   Basophils Relative 0 %   Basophils Absolute 0.0 0.0 - 0.1 K/uL   Immature Granulocytes 1 %   Abs Immature Granulocytes 0.07 0.00 - 0.07 K/uL    Comment: Performed at [facility], [place] [address] [place], [place] [address]  I-Stat beta hCG blood, ED     Status: None   Collection Time: 01/25/19  6:01 PM  Result Value Ref Range   I-stat hCG, quantitative <5.0 <5 mIU/mL   Comment 3            Comment:   GEST. AGE      CONC.  (mIU/mL)   <=1 WEEK        5 - 50     2 WEEKS       50 - 500     3 WEEKS       100 - 10,000     4 WEEKS     1,000 - 30,000        FEMALE AND NON-PREGNANT FEMALE:     LESS THAN 5 mIU/mL   Lactic acid, plasma     Status: Abnormal   Collection Time: 01/25/19  8:39 PM  Result Value Ref Range   Lactic Acid, Venous 2.3 (HH) 0.5 - 1.9 mmol/L    Comment: CRITICAL VALUE NOTED.  VALUE IS CONSISTENT WITH PREVIOUSLY REPORTED AND CALLED VALUE. Performed at [facility], [place] [address] [place], [place] [address]   Blood culture (routine x 2)     Status: None (Preliminary result)   Collection Time: 01/25/19  8:40 PM   Specimen: BLOOD  Result Value Ref Range   Specimen Description BLOOD RIGHT ANTECUBITAL    Special Requests      BOTTLES DRAWN AEROBIC ONLY Blood Culture results may not be optimal due to an inadequate volume of blood received in culture bottles   Culture      NO GROWTH < 24 HOURS Performed at [facility] [address], [place], [place] [address]    Report Status PENDING   Blood culture (routine x 2)     Status: None (Preliminary result)   Collection Time: 01/25/19  8:40 PM   Specimen: BLOOD  Result Value Ref  Range   Specimen Description BLOOD BLOOD RIGHT FOREARM    Special Requests      BOTTLES DRAWN AEROBIC AND ANAEROBIC Blood Culture results may not be optimal due to an inadequate volume of blood received in culture bottles   Culture      NO GROWTH < 24 HOURS Performed at [facility], [place] [address] [place], [place] [address]    Report Status PENDING   SARS CORONAVIRUS 2 (TAT 6-24 HRS) Nasopharyngeal Nasopharyngeal Swab     Status: None   Collection Time: 01/25/19 10:35 PM   Specimen: Nasopharyngeal Swab  Result Value Ref Range   SARS Coronavirus 2 NEGATIVE NEGATIVE    Comment: (NOTE) SARS-CoV-2 target nucleic acids are NOT DETECTED. The SARS-CoV-2 RNA is generally detectable in upper and lower respiratory specimens during the acute phase of infection. Negative results do not preclude SARS-CoV-2 infection, do not rule out co-infections with other pathogens, and should not be used as the sole basis for treatment or other patient management decisions. Negative results  must be combined with clinical observations, patient history, and epidemiological information. The expected result is Negative. Fact Sheet for Patients: HairSlick.nohttps://www.fda.gov/media/138098/download Fact Sheet for Healthcare Providers: quierodirigir.comhttps://www.fda.gov/media/138095/download This test is not yet approved or cleared by the [place] FDA and  has been authorized for detection and/or diagnosis of SARS-CoV-2 by FDA under an Emergency Use Authorization (EUA). This EUA will remain  in effect (meaning this test can be used) for the duration of the COVID-19 declaration under Section 56 4(b)(1) of the Act, 21 U.S.C. section 360bbb-3(b)(1), unless the authorization is terminated or revoked sooner. Performed at [facility], [address] [place], [place] [address]   Basic metabolic panel     Status: Abnormal   Collection Time: 01/26/19  3:58 AM  Result Value Ref Range   Sodium 140 135 - 145 mmol/L    Potassium 3.0 (L) 3.5 - 5.1 mmol/L   Chloride 105 98 - 111 mmol/L   CO2 23 22 - 32 mmol/L   Glucose, Bld 127 (H) 70 - 99 mg/dL   BUN 10 6 - 20 mg/dL   Creatinine, Ser 6.040.62 0.44 - 1.00 mg/dL   Calcium 8.3 (L) 8.9 - 10.3 mg/dL   GFR calc non Af Amer >60 >60 mL/min   GFR calc Af Amer >60 >60 mL/min   Anion gap 12 5 - 15    Comment: Performed at [facility], [address] [place], [place] [address]  HIV Antibody (routine testing w rflx)     Status: Abnormal   Collection Time: 01/26/19  3:58 AM  Result Value Ref Range   HIV Screen 4th Generation wRfx Non Reactive (A) Non Reactive    Comment: (NOTE) Performed At: [facility] [address] [place], [place] [id number] Jolene SchimkeNagendra Sanjai MD FA:[phone]   CBC     Status: Abnormal   Collection Time: 01/26/19  3:58 AM  Result Value Ref Range   WBC 9.2 4.0 - 10.5 K/uL   RBC 3.97 3.87 - 5.11 MIL/uL   Hemoglobin 12.5 12.0 - 15.0 g/dL   HCT 69.640.6 29.536.0 - 28.446.0 %   MCV 102.3 (H) 80.0 - 100.0 fL   MCH 31.5 26.0 - 34.0 pg   MCHC 30.8 30.0 - 36.0 g/dL   RDW 13.213.3 44.011.5 - 10.215.5 %   Platelets 265 150 - 400 K/uL   nRBC 0.0 0.0 - 0.2 %    Comment: Performed at [facility], [address] [place], [place] [address]  Sedimentation rate     Status: Abnormal   Collection Time: 01/26/19  3:58 AM  Result Value Ref Range   Sed Rate 71 (H) 0 - 22 mm/hr    Comment: Performed at [facility], [address] [place], [place] [address]  C-reactive protein     Status: Abnormal   Collection Time: 01/26/19  3:58 AM  Result Value Ref Range   CRP 13.1 (H) <1.0 mg/dL    Comment: Performed at [facility], [address] [place], [place] [address]  Lactic acid, plasma     Status: None   Collection Time: 01/26/19  3:58 AM  Result Value Ref Range   Lactic Acid, Venous 1.9 0.5 - 1.9 mmol/L    Comment: Performed at [facility], [address] [place], [place] [address]   No results found.    Assessment/Plan Active  Problems:   Multiple sclerosis (HCC)   Essential hypertension, benign   Stage 4 decubitus ulcer (  HCC)   Cellulitis  Sacral wound - wound is clean with a small amount of eschar at the superior edge - no fluctuance or purulent drainage noted.  - no indication for surgical debridement. Recommend hydrotherapy while pt is inpatient. - WOC could try a vac on this wound if they feel it would be appropriate.  We will sign off. Please page [place] with any further needs or questions regarding this patient's care.    Jerre Simon, [facility] Surgery 01/26/2019, 3:42 PM Please see amion for pager for the following: Horald Chestnut, & Friday 7:00am - 4:30pm Thursdays 7:00am -11:30am

## 2019-01-26 NOTE — TOC Initial Note (Signed)
Transition of Care [facility]) - Initial/Assessment Note    Patient Details  Name: [name] MRN: [id number] Date of Birth: 19-Jan-1971  Transition of [facility]) CM/SW Contact:    Pollie Friar, RN Phone Number: 01/26/2019, 1:07 PM  Clinical Narrative:                 Pt lives at home with her parents. She states she has a friend that comes twice a week and gives her a bath. She has [place] services through [facility]  PCP: MD making house calls She states she has an old wheelchair that is too small and would like a new chair. She states she has a hospital bed with regular mattress and think air overlay.  She or her dad are responsible for her medications.  She states when EMS takes her home they will need extra personnel d/t the steep stairway in the home.  PT recommending SNF. Pt unsure if she will agree.  TOC following.   Expected Discharge Plan: [place] Barriers to Discharge: Continued Medical Work up   Patient Goals and CMS Choice        Expected Discharge Plan and Services Expected Discharge Plan: [place]   Discharge Planning Services: CM Consult   Living arrangements for the past 2 months: Single Family Home                                      Prior Living Arrangements/Services Living arrangements for the past 2 months: Single Family Home Lives with:: Parents Patient language and need for interpreter reviewed:: Yes Do you feel safe going back to the place where you live?: Yes      Need for Family Participation in Patient Care: Yes (Comment) Care giver support system in place?: Yes (comment) Current home services: DME([facility]/ hospital bed with regular mattress, wheelchair,) Criminal Activity/Legal Involvement Pertinent to Current Situation/Hospitalization: No - Comment as needed  Activities of Daily Living Home Assistive Devices/Equipment: Hospital bed, Hoyer Lift ADL Screening (condition at time of  admission) Patient's cognitive ability adequate to safely complete daily activities?: Yes Is the patient deaf or have difficulty hearing?: No Does the patient have difficulty seeing, even when wearing glasses/contacts?: No Does the patient have difficulty concentrating, remembering, or making decisions?: No Patient able to express need for assistance with ADLs?: Yes Does the patient have difficulty dressing or bathing?: Yes Independently performs ADLs?: No Communication: Independent Dressing (OT): Dependent Is this a change from baseline?: Pre-admission baseline Grooming: Needs assistance Is this a change from baseline?: Pre-admission baseline Feeding: Independent Bathing: Dependent Is this a change from baseline?: Pre-admission baseline Toileting: Dependent Is this a change from baseline?: Pre-admission baseline In/Out Bed: Dependent Is this a change from baseline?: Pre-admission baseline Walks in Home: Dependent Is this a change from baseline?: Pre-admission baseline Does the patient have difficulty walking or climbing stairs?: Yes Weakness of Legs: Both Weakness of Arms/Hands: None  Permission Sought/Granted                  Emotional Assessment Appearance:: Appears stated age Attitude/Demeanor/Rapport: Engaged Affect (typically observed): Accepting, Pleasant Orientation: : Oriented to Self, Oriented to Place, Oriented to  Time, Oriented to Situation   Psych Involvement: No (comment)  Admission diagnosis:  Sacral decubitus ulcer, stage IV ([place]) [L89.154] Patient Active Problem List   Diagnosis Date Noted  .  Stage 4 decubitus ulcer (HCC) 01/25/2019  . Cellulitis 01/25/2019  . Decubitus skin ulcer 01/25/2019  . Anxiety 05/13/2013  . Essential hypertension, benign 04/25/2013  . Encounter for long-term (current) use of other medications 04/25/2013  . Constipation 04/09/2013  . Displaced bimalleolar fracture of left ankle 03/30/2013  . Multiple sclerosis (HCC)  08/25/2012  . Chest pain 08/25/2012  . Panic attack 08/25/2012   PCP:  Marletta Lor, NP Pharmacy:   [facility] [address], [place] - [address] [place] [address] Phone: [phone] Fax: [phone]     Social Determinants of Health (SDOH) Interventions    Readmission Risk Interventions No flowsheet data found.

## 2019-01-26 NOTE — Progress Notes (Addendum)
Initial Nutrition Assessment  DOCUMENTATION CODES:   Obesity unspecified  INTERVENTION:   -Ensure Enlive po BID, each supplement provides 350 kcal and 20 grams of protein. -MVI with minerals   NUTRITION DIAGNOSIS:   Increased nutrient needs related to wound healing as evidenced by estimated needs.  GOAL:   Patient will meet greater than or equal to 90% of their needs  MONITOR:   PO intake, Supplement acceptance, Labs, Weight trends  REASON FOR ASSESSMENT:   Malnutrition Screening Tool    ASSESSMENT:   48 year old pt admitted with known sacral ulcer with PMH of multiple sclerosis and HTN. Pt found to have stage 4 sacral ulcer with secondary infection with signs of cellulitis.  Pt was alert and sitting up in bed. Pt stated she had decreased appetite and vomiting about 3 weeks prior to admission. She has been eating bone broth and her father's chocolate Unjury at home. Pt stated she typically wakes around noon, eats lunch and dinner of chicken or steak with a vegetable, and drinks orange Crush throughout the day. Discussed with pt the importance of protein for wound healing.    Pt reported being diagnosed with multiple sclerosis in 2006 but said had not recently been seen by her dr. Abbott Pao stated she has been bed bound 2016 due to ankle and knee injury.  Pt was unsure of current or usual wt. The last charted wt is from 2015. The last weight she remembers is 318#. Stated she had been on gabapentin which caused wt gain but had discontinued use and noticed her stomach was smaller.  Pt likes chocolate Ensure.  Medications reviewed: lovenox 40mg , zosyn IVPB 3.375g, vancocin 1750 mg in NaCl 0.9% 560ml IVPB  Labs reviewed: Glucose 111, Potassium 3.0  NUTRITION - FOCUSED PHYSICAL EXAM:    Most Recent Value  Orbital Region  No depletion  Upper Arm Region  No depletion  Thoracic and Lumbar Region  No depletion  Buccal Region  No depletion  Temple Region  No depletion  Clavicle Bone  Region  No depletion  Clavicle and Acromion Bone Region  No depletion  Scapular Bone Region  No depletion  Dorsal Hand  No depletion  Patellar Region  No depletion  Anterior Thigh Region  No depletion  Posterior Calf Region  No depletion  Edema (RD Assessment)  Unable to assess  Hair  Reviewed  Eyes  Reviewed  Mouth  Reviewed  Skin  Reviewed  Nails  Reviewed       Diet Order:   Diet Order            Diet regular Room service appropriate? Yes; Fluid consistency: Thin  Diet effective now              EDUCATION NEEDS:   Education needs have been addressed  Skin:  Skin Assessment: Reviewed RN Assessment  Last BM:  11/17  Height:   Ht Readings from Last 1 Encounters:  01/25/19 5\' 2"  (1.575 m)    Weight:   Wt Readings from Last 1 Encounters:  01/25/19 113.4 kg    Ideal Body Weight:  50 kg  BMI:  Body mass index is 45.73 kg/m.  Estimated Nutritional Needs:   Kcal:  1500-1700  Protein:  75-85  Fluid:  1.5-1.7L   Allen Norris Dietetic Intern Pager # [phone]

## 2019-01-26 NOTE — Progress Notes (Signed)
PROGRESS NOTE    [name]  BMW:[id number] DOB: 1970-05-30 DOA: 01/25/2019  PCP: Alvester Chou, NP    LOS - 1   Brief Narrative:  48 y.o. female with history of Multiple Sclerosis with a known sacral decubitus ulcer followed by home health 3x per week who presents for evaluation of the wound. Home health RN with concern that wound may be becoming infected. Wound has become more painful and has increased with degree of bleeding for about the past 2 weeks. Ulcer has been present since May. Patient is not on any antibiotics. Denies fevers/chills.  Patient is bed bound. No function of LE bilaterally. She uses air mattress at home.  In the ED, labs showed WBC 11k, lactate 3.3.  Blood cultures obtained and pending.  Started on broad-spectrum antibiotics with Vanco and Zosyn, in addition to 1 L bolus and morphine for pain.  Subjective 11/17: Patient awake laying in bed, just finished physical therapy eval.  Patient reports being miserable.  Requests her home gabapentin and muscle relaxers to be resumed as her spasticity and associated pain become much worse without the use.  She declines to enroll for my examination of her wound stating her legs hurt too much right now.  Patient denies fevers, chills, nausea vomiting or diarrhea, chest pain, shortness of breath.  No acute events reported overnight.  Assessment & Plan:   Active Problems:   Multiple sclerosis (HCC)   Essential hypertension, benign   Stage 4 decubitus ulcer (HCC)   Cellulitis   Stage IV decubitus ulcer, present on admission Cellulitis associated with above Known stage IV sacral ulcer present since at least May 2020, followed by home health.  Lactate elevated on admission at 3.3 with mild leukocytosis 11.3, mildly tachycardic but otherwise vitals stable not overtly septic.  Inflammatory markers elevated with ESR 71, CRP 13.1.  Repeat lactate improved is 2.3.  Reportedly no bone visible, however some concern for developing  osteomyelitis. -Continue Vanco and Zosyn for now -Follow-up blood cultures -Pain management with as needed morphine, Tylenol.  Stopped scheduled ibuprofen while on Vanco Zosyn for renal protection. -Low threshold to image for possible osteo- -Wound care following -General surgery consulted for debridement -Consider ID consult  Multiple sclerosis -stable Patient bedbound at baseline with no function of bilateral lower extremities. -Continue home gabapentin, baclofen, Robaxin -PT evaluation  Essential hypertension -chronic, stable No longer on home med.  Monitor BP closely.   DVT prophylaxis: Lovenox   Code Status: Full Code  Family Communication: None at bedside Disposition Plan: Pending clinical course   Consultants:   General surgery  Wound care  Procedures:   None  Antimicrobials:   IV vancomycin, started 11/16  IV Zosyn, start 11/16   Objective: Vitals:   01/25/19 2100 01/26/19 0207 01/26/19 0605 01/26/19 0755  BP:  (!) 141/89 134/80 131/74  Pulse:  (!) 111 (!) 103 98  Resp:   17 20  Temp:  98.4 F (36.9 C) 98.7 F (37.1 C) 98.6 F (37 C)  TempSrc:  Oral Oral Oral  SpO2:  98% 100% 100%  Weight: 113.4 kg     Height: 5' 2"  (1.575 m)       Intake/Output Summary (Last 24 hours) at 01/26/2019 1249 Last data filed at 01/26/2019 1229 Gross per 24 hour  Intake 1050 ml  Output 450 ml  Net 600 ml   Filed Weights   01/25/19 2100  Weight: 113.4 kg    Examination:  General exam: awake, alert, no acute  distress, obese Respiratory system: clear to auscultation, no wheezes, rales or rhonchi, normal respiratory effort. Cardiovascular system: normal S1/S2, RRR, no JVD, murmurs, rubs, gallops, no pedal edema.   Gastrointestinal system: soft, non-tender, non-distended abdomen Central nervous system: alert and oriented x4. Normal speech. Extremities: No cyanosis, no edema, normal tone Psychiatry: normal mood, congruent affect, judgement and insight appear  normal    Data Reviewed: I have personally reviewed following labs and imaging studies  CBC: Recent Labs  Lab 01/25/19 1650 01/26/19 0358  WBC 11.3* 9.2  NEUTROABS 7.2  --   HGB 13.8 12.5  HCT 43.6 40.6  MCV 99.5 102.3*  PLT 291 426   Basic Metabolic Panel: Recent Labs  Lab 01/25/19 1650 01/26/19 0358  NA 138 140  K 3.2* 3.0*  CL 100 105  CO2 25 23  GLUCOSE 120* 127*  BUN 15 10  CREATININE 0.91 0.62  CALCIUM 8.9 8.3*   GFR: Estimated Creatinine Clearance: 100.1 mL/min (by C-G formula based on SCr of 0.62 mg/dL). Liver Function Tests: Recent Labs  Lab 01/25/19 1650  AST 37  ALT 15  ALKPHOS 112  BILITOT 0.5  PROT 7.3  ALBUMIN 2.4*   No results for input(s): LIPASE, AMYLASE in the last 168 hours. No results for input(s): AMMONIA in the last 168 hours. Coagulation Profile: No results for input(s): INR, PROTIME in the last 168 hours. Cardiac Enzymes: No results for input(s): CKTOTAL, CKMB, CKMBINDEX, TROPONINI in the last 168 hours. BNP (last 3 results) No results for input(s): PROBNP in the last 8760 hours. HbA1C: No results for input(s): HGBA1C in the last 72 hours. CBG: No results for input(s): GLUCAP in the last 168 hours. Lipid Profile: No results for input(s): CHOL, HDL, LDLCALC, TRIG, CHOLHDL, LDLDIRECT in the last 72 hours. Thyroid Function Tests: No results for input(s): TSH, T4TOTAL, FREET4, T3FREE, THYROIDAB in the last 72 hours. Anemia Panel: No results for input(s): VITAMINB12, FOLATE, FERRITIN, TIBC, IRON, RETICCTPCT in the last 72 hours. Sepsis Labs: Recent Labs  Lab 01/25/19 1650 01/25/19 2039 01/26/19 0358  LATICACIDVEN 3.3* 2.3* 1.9    Recent Results (from the past 240 hour(s))  Blood culture (routine x 2)     Status: None (Preliminary result)   Collection Time: 01/25/19  8:40 PM   Specimen: BLOOD  Result Value Ref Range Status   Specimen Description BLOOD RIGHT ANTECUBITAL  Final   Special Requests   Final    BOTTLES DRAWN  AEROBIC ONLY Blood Culture results may not be optimal due to an inadequate volume of blood received in culture bottles   Culture   Final    NO GROWTH < 24 HOURS Performed at [facility], [place] [address] [place], [place] [address]    Report Status PENDING  Incomplete  Blood culture (routine x 2)     Status: None (Preliminary result)   Collection Time: 01/25/19  8:40 PM   Specimen: BLOOD  Result Value Ref Range Status   Specimen Description BLOOD BLOOD RIGHT FOREARM  Final   Special Requests   Final    BOTTLES DRAWN AEROBIC AND ANAEROBIC Blood Culture results may not be optimal due to an inadequate volume of blood received in culture bottles   Culture   Final    NO GROWTH < 24 HOURS Performed at [facility], [place] [address], [place], [place] [address]    Report Status PENDING  Incomplete  SARS CORONAVIRUS 2 (TAT 6-24 HRS) Nasopharyngeal Nasopharyngeal Swab  Status: None   Collection Time: 01/25/19 10:35 PM   Specimen: Nasopharyngeal Swab  Result Value Ref Range Status   SARS Coronavirus 2 NEGATIVE NEGATIVE Final    Comment: (NOTE) SARS-CoV-2 target nucleic acids are NOT DETECTED. The SARS-CoV-2 RNA is generally detectable in upper and lower respiratory specimens during the acute phase of infection. Negative results do not preclude SARS-CoV-2 infection, do not rule out co-infections with other pathogens, and should not be used as the sole basis for treatment or other patient management decisions. Negative results must be combined with clinical observations, patient history, and epidemiological information. The expected result is Negative. Fact Sheet for Patients: SugarRoll.be Fact Sheet for Healthcare Providers: https://www.woods-mathews.com/ This test is not yet approved or cleared by the [place] FDA and  has been authorized for detection and/or diagnosis of SARS-CoV-2 by FDA under an Emergency Use Authorization (EUA).  This EUA will remain  in effect (meaning this test can be used) for the duration of the COVID-19 declaration under Section 56 4(b)(1) of the Act, 21 U.S.C. section 360bbb-3(b)(1), unless the authorization is terminated or revoked sooner. Performed at [facility], [place] [address], [place], [place] [address]          Radiology Studies: No results found.      Scheduled Meds: . enoxaparin (LOVENOX) injection  40 mg Subcutaneous Q24H  . feeding supplement (ENSURE ENLIVE)  237 mL Oral BID BM  . Gerhardt's butt cream   Topical TID  . ibuprofen  600 mg Oral Q6H   Continuous Infusions: . piperacillin-tazobactam (ZOSYN)  IV    . [START ON 01/27/2019] vancomycin       LOS: 1 day    Time spent: 30 minutes    Ezekiel Slocumb, DO [facility] Pager: [phone]  If 7PM-7AM, please contact night-coverage [email or web address] Password [facility] 01/26/2019, 12:49 PM

## 2019-01-26 NOTE — Progress Notes (Signed)
Pharmacy Antibiotic Note  [name] is a 48 y.o. female admitted on 01/25/2019 with cellulitis.  Pharmacy has been consulted for vancomycin/zosyn dosing. Afebrile, WBC down to WNL, LA 3.3>1.9. SCr down a bit, 0.91>0.62.  Plan: Continue Zosyn 3.375g IV q8h (4h infusion) Change vancomycin to 1750 mg IV Q 24 hrs. Goal AUC 400-550. Expected AUC: 517 SCr used: 0.8 Monitor clinical progress, c/s, renal function F/u de-escalation plan/LOT, vancomycin levels as indicated   Height: 5\' 2"  (157.5 cm) Weight: 250 lb (113.4 kg) IBW/kg (Calculated) : 50.1  Temp (24hrs), Avg:98.5 F (36.9 C), Min:98.4 F (36.9 C), Max:98.7 F (37.1 C)  Recent Labs  Lab 01/25/19 1650 01/25/19 2039 01/26/19 0358  WBC 11.3*  --  9.2  CREATININE 0.91  --  0.62  LATICACIDVEN 3.3* 2.3* 1.9    Estimated Creatinine Clearance: 100.1 mL/min (by C-G formula based on SCr of 0.62 mg/dL).    Allergies  Allergen Reactions  . Tizanidine Diarrhea    Antimicrobials this admission: 11/16 vancomycin >>  11/16 zosyn >>   Dose adjustments this admission:   Microbiology results: 11/16 BCx -  11/16 covid - neg   Elicia Lamp, PharmD, BCPS Clinical Pharmacist 01/26/2019 9:53 AM

## 2019-01-26 NOTE — ED Notes (Signed)
ED TO INPATIENT HANDOFF REPORT  ED Nurse Name and Phone #: [phone] Lauree Chandler., rN  S Name/Age/Gender [name] 48 y.o. female Room/Bed: H015C/H015C  Code Status   Code Status: Full Code  Home/SNF/Other Home Patient oriented to: self, place, time and situation Is this baseline? Yes   Triage Complete: Triage complete  Chief Complaint Pressure Wound  Triage Note Per [facility], pt from home with hx of MS and is bed bound, has pressure wound to left buttock area and every time wound RN changes dressing it bleeds profusely x 1 week. VSS. Axox4.    Allergies Allergies  Allergen Reactions  . Tizanidine Diarrhea    Level of Care/Admitting Diagnosis ED Disposition    ED Disposition Condition [facility] Area: [place] [100100]  Level of Care: Med-Surg [16]  Covid Evaluation: Asymptomatic Screening Protocol (No Symptoms)  Diagnosis: Decubitus skin ulcer [809983]  Admitting Physician: Nicolette Bang [[id number]]  Attending Physician: Nicolette Bang [[id number]]  Estimated length of stay: past midnight tomorrow  Certification:: I certify this patient will need inpatient services for at least 2 midnights  PT Class (Do Not Modify): Inpatient [101]  PT Acc Code (Do Not Modify): Private [1]       B Medical/Surgery History Past Medical History:  Diagnosis Date  . Arthritis   . MS (multiple sclerosis)    Past Surgical History:  Procedure Laterality Date  . NO PAST SURGERIES    . ORIF ANKLE FRACTURE Left 03/30/2013   Procedure: OPEN REDUCTION INTERNAL FIXATION (ORIF) LEFT ANKLE FRACTURE;  Surgeon: Wylene Simmer, MD;  Location: [facility];  Service: Orthopedics;  Laterality: Left;     A IV Location/Drains/Wounds Patient Lines/Drains/Airways Status   Active Line/Drains/Airways    Name:   Placement date:   Placement time:   Site:   Days:   Peripheral IV 01/25/19 Left Antecubital   01/25/19    -    Antecubital   1   Incision  03/30/13 Leg Left   03/30/13    1332     2128          Intake/Output Last 24 hours  Intake/Output Summary (Last 24 hours) at 01/26/2019 0503 Last data filed at 01/25/2019 2200 Gross per 24 hour  Intake 1050 ml  Output -  Net 1050 ml    Labs/Imaging Results for orders placed or performed during the hospital encounter of 01/25/19 (from the past 48 hour(s))  Lactic acid, plasma     Status: Abnormal   Collection Time: 01/25/19  4:50 PM  Result Value Ref Range   Lactic Acid, Venous 3.3 (HH) 0.5 - 1.9 mmol/L    Comment: CRITICAL RESULT CALLED TO, READ BACK BY AND VERIFIED WITH: K.BROWN,RN [id number] I.MANNING Performed at [facility], [place] [address] [place], [place] [address]   Comprehensive metabolic panel     Status: Abnormal   Collection Time: 01/25/19  4:50 PM  Result Value Ref Range   Sodium 138 135 - 145 mmol/L   Potassium 3.2 (L) 3.5 - 5.1 mmol/L   Chloride 100 98 - 111 mmol/L   CO2 25 22 - 32 mmol/L   Glucose, Bld 120 (H) 70 - 99 mg/dL   BUN 15 6 - 20 mg/dL   Creatinine, Ser 0.91 0.44 - 1.00 mg/dL   Calcium 8.9 8.9 - 10.3 mg/dL   Total Protein 7.3 6.5 - 8.1 g/dL   Albumin 2.4 (L) 3.5 - 5.0 g/dL  AST 37 15 - 41 U/L   ALT 15 0 - 44 U/L   Alkaline Phosphatase 112 38 - 126 U/L   Total Bilirubin 0.5 0.3 - 1.2 mg/dL   GFR calc non Af Amer >60 >60 mL/min   GFR calc Af Amer >60 >60 mL/min   Anion gap 13 5 - 15    Comment: Performed at [facility], [address] [place], [place] [address]  CBC with Differential     Status: Abnormal   Collection Time: 01/25/19  4:50 PM  Result Value Ref Range   WBC 11.3 (H) 4.0 - 10.5 K/uL   RBC 4.38 3.87 - 5.11 MIL/uL   Hemoglobin 13.8 12.0 - 15.0 g/dL   HCT 16.3 84.5 - 36.4 %   MCV 99.5 80.0 - 100.0 fL   MCH 31.5 26.0 - 34.0 pg   MCHC 31.7 30.0 - 36.0 g/dL   RDW 68.0 32.1 - 22.4 %   Platelets 291 150 - 400 K/uL   nRBC 0.0 0.0 - 0.2 %   Neutrophils Relative % 64 %   Neutro Abs 7.2 1.7 - 7.7 K/uL    Lymphocytes Relative 25 %   Lymphs Abs 2.8 0.7 - 4.0 K/uL   Monocytes Relative 9 %   Monocytes Absolute 1.0 0.1 - 1.0 K/uL   Eosinophils Relative 1 %   Eosinophils Absolute 0.1 0.0 - 0.5 K/uL   Basophils Relative 0 %   Basophils Absolute 0.0 0.0 - 0.1 K/uL   Immature Granulocytes 1 %   Abs Immature Granulocytes 0.07 0.00 - 0.07 K/uL    Comment: Performed at [facility], [address], [place], [place] [address]  Lactic acid, plasma     Status: Abnormal   Collection Time: 01/25/19  8:39 PM  Result Value Ref Range   Lactic Acid, Venous 2.3 (HH) 0.5 - 1.9 mmol/L    Comment: CRITICAL VALUE NOTED.  VALUE IS CONSISTENT WITH PREVIOUSLY REPORTED AND CALLED VALUE. Performed at [facility], [address], [place], [place] [address]   SARS CORONAVIRUS 2 (TAT 6-24 HRS) Nasopharyngeal Nasopharyngeal Swab     Status: None   Collection Time: 01/25/19 10:35 PM   Specimen: Nasopharyngeal Swab  Result Value Ref Range   SARS Coronavirus 2 NEGATIVE NEGATIVE    Comment: (NOTE) SARS-CoV-2 target nucleic acids are NOT DETECTED. The SARS-CoV-2 RNA is generally detectable in upper and lower respiratory specimens during the acute phase of infection. Negative results do not preclude SARS-CoV-2 infection, do not rule out co-infections with other pathogens, and should not be used as the sole basis for treatment or other patient management decisions. Negative results must be combined with clinical observations, patient history, and epidemiological information. The expected result is Negative. Fact Sheet for Patients: HairSlick.no Fact Sheet for Healthcare Providers: quierodirigir.com This test is not yet approved or cleared by the [place] FDA and  has been authorized for detection and/or diagnosis of SARS-CoV-2 by FDA under an Emergency Use Authorization (EUA). This EUA will remain  in effect (meaning this test can be used) for the  duration of the COVID-19 declaration under Section 56 4(b)(1) of the Act, 21 U.S.C. section 360bbb-3(b)(1), unless the authorization is terminated or revoked sooner. Performed at [facility], [address], [place], [place] [address]    No results found.  Pending Labs Unresulted Labs (From admission, onward)    Start     Ordered   01/26/19 0500  CBC  Tomorrow morning,   R     01/26/19 0053   01/26/19 0500  Basic metabolic panel  Daily,   R     01/25/19 2135   01/26/19 0054  HIV Antibody (routine testing w rflx)  (HIV Antibody (Routine testing w reflex) panel)  Once,   STAT     01/26/19 0053   01/26/19 0054  Sedimentation rate  Once,   STAT     01/26/19 0053   01/26/19 0054  C-reactive protein  Once,   STAT     01/26/19 0053   01/26/19 0053  Lactic acid, plasma  STAT Now then every 3 hours,   R (with STAT occurrences)     01/26/19 0053   01/25/19 2022  Blood culture (routine x 2)  BLOOD CULTURE X 2,   STAT     01/25/19 2021          Vitals/Pain Today's Vitals   01/25/19 1643 01/25/19 2100 01/26/19 0207  BP: 129/67  (!) 141/89  Pulse: (!) 106  (!) 111  Resp: 16    Temp: 98.4 F (36.9 C)  98.4 F (36.9 C)  TempSrc: Oral  Oral  SpO2: 98%  98%  Weight:  113.4 kg   Height:  5\' 2"  (1.575 m)   PainSc:   7     Isolation Precautions No active isolations  Medications Medications  enoxaparin (LOVENOX) injection 40 mg (40 mg Subcutaneous Given 01/26/19 0240)  polyethylene glycol (MIRALAX / GLYCOLAX) packet 17 g (has no administration in time range)  bisacodyl (DULCOLAX) EC tablet 5 mg (has no administration in time range)  morphine 4 MG/ML injection 4 mg (4 mg Intravenous Given 01/26/19 0209)  ibuprofen (ADVIL) tablet 600 mg (600 mg Oral Given 01/26/19 0242)  acetaminophen (TYLENOL) tablet 650 mg (has no administration in time range)  vancomycin (VANCOCIN) 1,250 mg in sodium chloride 0.9 % 250 mL IVPB (0 mg Intravenous Stopped 01/26/19 0053)   piperacillin-tazobactam (ZOSYN) IVPB 3.375 g (has no administration in time range)  sodium chloride 0.9 % bolus 1,000 mL (0 mLs Intravenous Stopped 01/25/19 2200)  morphine 4 MG/ML injection 4 mg (4 mg Intravenous Given 01/25/19 2103)  piperacillin-tazobactam (ZOSYN) IVPB 3.375 g (0 g Intravenous Stopped 01/25/19 2136)  piperacillin-tazobactam (ZOSYN) IVPB 3.375 g (0 g Intravenous Stopped 01/26/19 0503)  vancomycin (VANCOCIN) IVPB 1000 mg/200 mL premix (0 mg Intravenous Stopped 01/26/19 0503)  sodium chloride 0.9 % bolus 1,000 mL (0 mLs Intravenous Stopped 01/26/19 0503)    Mobility non-ambulatory     Focused Assessments Cardiac Assessment Handoff:    Lab Results  Component Value Date   TROPONINI <0.30 08/25/2012   No results found for: DDIMER Does the Patient currently have chest pain? No     R Recommendations: See Admitting Provider Note  Report given to:   Additional Notes:

## 2019-01-26 NOTE — Evaluation (Signed)
Physical Therapy Evaluation Patient Details Name: [name] MRN: [id number] DOB: 06-20-1970 Today's Date: 01/26/2019   History of Present Illness  Pt is a 48 y/o F admitted on 01/25/19 for a worsening sacral decubitus ulcer.  Pt with PMH significant for MS & HTN. Pt is bedbound at baseline & has [facility] RN.  Clinical Impression  Pt seen for PT evaluation to focus on bed mobility as pt reports she's been bed bound for past 4 years with her parents assisting her with rolling L<>R for peri care. Pt reports decreased use of BUE and uses momentum for rolling, requiring mod<>max assist overall with hospital bed features. Pt would benefit from acute PT services to focus on increasing independence with bed mobility & will require 24 hr assist upon d/c.  Pt reports her current manual w/c is too small for her, but she is fearful of using hoyer lift to transfer to w/c.     Follow Up Recommendations SNF    Equipment Recommendations  None recommended by PT    Recommendations for Other Services       Precautions / Restrictions Precautions Precautions: Fall Restrictions Weight Bearing Restrictions: No      Mobility  Bed Mobility Overal bed mobility: Needs Assistance Bed Mobility: Rolling Rolling: Mod assist;Max assist         General bed mobility comments: rolling L<>R with bed rails, pt uses momentum  Transfers                    Ambulation/Gait                Stairs            Wheelchair Mobility    Modified Rankin (Stroke Patients Only)       Balance                                             Pertinent Vitals/Pain Pain Assessment: Faces Faces Pain Scale: Hurts little more Pain Location: BLE Pain Descriptors / Indicators: Grimacing Pain Intervention(s): Repositioned    Home Living Family/patient expects to be discharged to:: Private residence Living Arrangements: Parent(elderly parents) Available Help at Discharge:  Family;Available 24 hours/day Type of Home: House Home Access: Stairs to enter Entrance Stairs-Rails: Psychiatric nurse of Steps: 10 Home Layout: One level Home Equipment: Wheelchair - manual;Hospital bed(pt reports her w/c is too small for her, hoyer lift)      Prior Function Level of Independence: Needs assistance         Comments: pt reports being bed bound for past 4 years, parents assist with changing brief & rolling in bed, pt has hoyer lift but is fearful of using it     Hand Dominance        Extremity/Trunk Assessment   Upper Extremity Assessment Upper Extremity Assessment: Generalized weakness    Lower Extremity Assessment Lower Extremity Assessment: (pt reports inability to use BLE 2/2 MS, no active movement noted in BLE)       Communication   Communication: No difficulties  Cognition Arousal/Alertness: Awake/alert Behavior During Therapy: WFL for tasks assessed/performed Overall Cognitive Status: Within Functional Limits for tasks assessed  General Comments      Exercises     Assessment/Plan    PT Assessment Patient needs continued PT services  PT Problem List Decreased strength;Impaired tone;Pain;Decreased range of motion;Decreased mobility;Obesity;Decreased activity tolerance;Decreased skin integrity       PT Treatment Interventions Functional mobility training;Patient/family education;Therapeutic activities;Manual techniques    PT Goals (Current goals can be found in the Care Plan section)  Acute Rehab PT Goals Patient Stated Goal: none stated PT Goal Formulation: With patient Time For Goal Achievement: 02/15/19 Potential to Achieve Goals: Fair    Frequency Min 2X/week   Barriers to discharge Decreased caregiver support unsure    Co-evaluation               AM-PAC PT "6 Clicks" Mobility  Outcome Measure Help needed turning from your back to your side while  in a flat bed without using bedrails?: A Lot Help needed moving from lying on your back to sitting on the side of a flat bed without using bedrails?: Total Help needed moving to and from a bed to a chair (including a wheelchair)?: Total Help needed standing up from a chair using your arms (e.g., wheelchair or bedside chair)?: Total Help needed to walk in hospital room?: Total Help needed climbing 3-5 steps with a railing? : Total 6 Click Score: 7    End of Session   Activity Tolerance: Patient tolerated treatment well Patient left: in bed;with call bell/phone within reach(with prevalon boots donned & MD in room)   PT Visit Diagnosis: Muscle weakness (generalized) (M62.81)    Time: 5366-4403 PT Time Calculation (min) (ACUTE ONLY): 18 min   Charges:   PT Evaluation $PT Eval Moderate Complexity: 1 Mod             Sandi Mariscal, PT, DPT 01/26/2019, 12:57 PM

## 2019-01-27 ENCOUNTER — Inpatient Hospital Stay (HOSPITAL_COMMUNITY): Payer: Medicare PPO

## 2019-01-27 LAB — CBC WITH DIFFERENTIAL/PLATELET
Abs Immature Granulocytes: 0.04 10*3/uL (ref 0.00–0.07)
Basophils Absolute: 0 10*3/uL (ref 0.0–0.1)
Basophils Relative: 0 %
Eosinophils Absolute: 0.2 10*3/uL (ref 0.0–0.5)
Eosinophils Relative: 3 %
HCT: 36 % (ref 36.0–46.0)
Hemoglobin: 11.6 g/dL — ABNORMAL LOW (ref 12.0–15.0)
Immature Granulocytes: 0 %
Lymphocytes Relative: 42 %
Lymphs Abs: 3.8 10*3/uL (ref 0.7–4.0)
MCH: 31.6 pg (ref 26.0–34.0)
MCHC: 32.2 g/dL (ref 30.0–36.0)
MCV: 98.1 fL (ref 80.0–100.0)
Monocytes Absolute: 0.7 10*3/uL (ref 0.1–1.0)
Monocytes Relative: 8 %
Neutro Abs: 4.2 10*3/uL (ref 1.7–7.7)
Neutrophils Relative %: 47 %
Platelets: 230 10*3/uL (ref 150–400)
RBC: 3.67 MIL/uL — ABNORMAL LOW (ref 3.87–5.11)
RDW: 13.2 % (ref 11.5–15.5)
WBC: 8.9 10*3/uL (ref 4.0–10.5)
nRBC: 0 % (ref 0.0–0.2)

## 2019-01-27 LAB — BASIC METABOLIC PANEL
Anion gap: 10 (ref 5–15)
BUN: 8 mg/dL (ref 6–20)
CO2: 27 mmol/L (ref 22–32)
Calcium: 8.7 mg/dL — ABNORMAL LOW (ref 8.9–10.3)
Chloride: 103 mmol/L (ref 98–111)
Creatinine, Ser: 0.57 mg/dL (ref 0.44–1.00)
GFR calc Af Amer: >60 mL/min (ref >60)
GFR calc non Af Amer: >60 mL/min (ref >60)
Glucose, Bld: 98 mg/dL (ref 70–99)
Potassium: 3.1 mmol/L — ABNORMAL LOW (ref 3.5–5.1)
Sodium: 140 mmol/L (ref 135–145)

## 2019-01-27 LAB — MAGNESIUM: Magnesium: 1.7 mg/dL (ref 1.7–2.4)

## 2019-01-27 MED ORDER — HYDRALAZINE HCL 20 MG/ML IJ SOLN: 10.0000 mg | Freq: Four times a day (QID) | INTRAMUSCULAR | Status: DC | PRN

## 2019-01-27 MED ORDER — POTASSIUM CHLORIDE CRYS ER 20 MEQ PO TBCR
40.0000 meq | EXTENDED_RELEASE_TABLET | Freq: Every day | ORAL | Status: DC
  Administered 2019-01-27 – 2019-01-28 (×2): 40 meq via ORAL
  Filled 2019-01-27 (×2): qty 2

## 2019-01-27 MED ORDER — ENOXAPARIN SODIUM 60 MG/0.6ML ~~LOC~~ SOLN
55.0000 mg | SUBCUTANEOUS | Status: DC
  Administered 2019-01-27 – 2019-02-01 (×6): 55 mg via SUBCUTANEOUS
  Filled 2019-01-27 (×8): qty 0.55

## 2019-01-27 MED ORDER — CARVEDILOL 3.125 MG PO TABS
3.1250 mg | ORAL_TABLET | Freq: Two times a day (BID) | ORAL | Status: DC
  Administered 2019-01-27 – 2019-02-02 (×12): 3.125 mg via ORAL
  Filled 2019-01-27 (×12): qty 1

## 2019-01-27 MED ORDER — METOPROLOL TARTRATE 25 MG PO TABS: 25.0000 mg | ORAL_TABLET | Freq: Two times a day (BID) | ORAL | Status: DC

## 2019-01-27 MED ORDER — CARVEDILOL 3.125 MG PO TABS: 3.1250 mg | ORAL_TABLET | Freq: Two times a day (BID) | ORAL | Status: DC

## 2019-01-27 NOTE — Progress Notes (Signed)
Physical Therapy Wound Evaluation/Treatment Patient Details  Name: [name] MRN: [id number] Date of Birth: 01-30-1971  Today's Date: 01/27/2019 Time:  -     Subjective  Subjective: Pt agreeable to hydrotherapy - with many complaints regarding pain Patient and Family Stated Goals: Heal wound, heal groin injury Date of Onset: (Unsure)  Pain Score:  Pt premedicated and crying out in pain during positioning. Reports no pain at wound site however.  Wound Assessment  Wound / Incision (Open or Dehisced) 01/26/19 Other (Comment) Buttocks Left Stg 4 (Active)  Wound Image   01/27/19 1418  Dressing Type ABD;Barrier Film (skin prep);Gauze (Comment);Moist to dry 01/27/19 1418  Dressing Changed Changed 01/27/19 1418  Dressing Status Clean;Dry;Intact 01/27/19 1418  Dressing Change Frequency Daily 01/27/19 1418  Site / Wound Assessment Bleeding;Red;Yellow;Black 01/27/19 1418  % Wound base Red or Granulating 85% 01/27/19 1418  % Wound base Yellow/Fibrinous Exudate 5% 01/27/19 1418  % Wound base Black/Eschar 10% 01/27/19 1418  % Wound base Other/Granulation Tissue (Comment) 0% 01/27/19 1418  Peri-wound Assessment Intact;Erythema (blanchable) 01/27/19 1418  Wound Length (cm) 5 cm 01/27/19 1100  Wound Width (cm) 2.7 cm 01/27/19 1100  Wound Depth (cm) 4.5 cm 01/27/19 1100  Wound Volume (cm^3) 60.75 cm^3 01/27/19 1100  Wound Surface Area (cm^2) 13.5 cm^2 01/27/19 1100  Margins Unattached edges (unapproximated) 01/27/19 1418  Closure None 01/27/19 1418  Drainage Amount Moderate 01/27/19 1418  Drainage Description Sanguineous 01/27/19 1418  Treatment Debridement (Selective);Hydrotherapy (Pulse lavage);Packing (Saline gauze) 01/27/19 1418      Hydrotherapy Pulsed lavage therapy - wound location: L buttock Pulsed Lavage with Suction (psi): 12 psi Pulsed Lavage with Suction - Normal Saline Used: 1000 mL Pulsed Lavage Tip: Tip with splash shield Selective Debridement Selective Debridement -  Location: L buttock Selective Debridement - Tools Used: Forceps;Scalpel;Scissors Selective Debridement - Tissue Removed: yellow nad black necrotic tissue   Wound Assessment and Plan  Wound Therapy - Assess/Plan/Recommendations Wound Therapy - Clinical Statement: Pt presents to hydrotherapy with stage IV pressure injury. Positioning was painful and difficult for pt but she tolerated the treatment well overall. Reported pain was not in the wound site itself, but surrounding irritated skin and in groin from a previous injury from pt report. The wound itself is fairly clean however will continue to follow on a MWF basis for continued pulsed-lavage and selective debridement of non-viable tissue. Of note, pt declined paper tape and wanted the regular tape as she states the paper tape does not stick to her skin. PT agreed with the understanding that we will have to move to a different method if more skin tears are noted or if additional areas of skin breakdown are seen.  Wound Therapy - Functional Problem List: Decreased tolerance for position changes Factors Delaying/Impairing Wound Healing: Altered sensation;Incontinence;Multiple medical problems Hydrotherapy Plan: Debridement;Dressing change;Patient/family education;Pulsatile lavage with suction Wound Therapy - Frequency: 3X / week(MWF) Wound Therapy - Follow Up Recommendations: Skilled nursing facility(vs home health RN depending on d/c plan) Wound Plan: See above  Wound Therapy Goals- Improve the function of patient's integumentary system by progressing the wound(s) through the phases of wound healing (inflammation - proliferation - remodeling) by: Decrease Necrotic Tissue to: 0 Decrease Necrotic Tissue - Progress: Goal set today Increase Granulation Tissue to: 100 Increase Granulation Tissue - Progress: Goal set today Goals/treatment plan/discharge plan were made with and agreed upon by patient/family: Yes Time For Goal Achievement: 7 days Wound  Therapy - Potential for Goals: Fair  Goals will be updated until  maximal potential achieved or discharge criteria met.  Discharge criteria: when goals achieved, discharge from hospital, MD decision/surgical intervention, no progress towards goals, refusal/missing three consecutive treatments without notification or medical reason.  GP     Thelma Comp 01/27/2019, 2:34 PM   Rolinda Roan, PT, DPT Acute Rehabilitation Services Pager: [phone] Office: [phone]

## 2019-01-27 NOTE — Progress Notes (Signed)
PROGRESS NOTE  [name]  DOB: Aug 04, 1970  PCP: Marletta Lor, NP AOZ:[id number]  DOA: 01/25/2019  LOS: 2 days   Chief Complaint  Patient presents with  . Wound Infection   Brief narrative: [name] is a 48 y.o. female with PMH of Multiple Sclerosis with a known sacral decubitus ulcer followed by home health 3x per week.  Patient was sent to the ED on 11/16 by her home health RN was concerned because of the degree of pain, bleeding and foul smell from the wound. At baseline, patient has paraparesis, is bedbound, has bladder incontinence. She uses air mattress at home.   In the ED, labs showed WBC 11k, lactate 3.3.   Blood cultures obtained.  Started on broad-spectrum antibiotics with Vanco and Zosyn. Patient was admitted to hospitalist medicine service.  Subjective: Patient was seen and examined this morning.  Middle-aged Caucasian female.  Lying down in bed.  Not in distress.  Assessment/Plan: Stage IV decubitus ulcer, present on admission Cellulitis associated with above -Known stage IV sacral ulcer present since at least May 2020, followed by home health.   -On admission, WBC 11.3, lactic 3.3, tachycardic.  Not overtly septic.   -Repeat lactic acid level was down to 2.3.   -Surgical consultation was obtained.  No debridement was recommended.   -Wound care nurse following.  May need wound VAC.   -No growth in blood cultures so far.   -Currently on IV vancomycin and IV Zosyn.   -We will continue same for today.  Since there is no culture data to support any potential antibiotic choice, will de-escalate from broad-spectrum antibiotics starting tomorrow. -I do not see any imaging obtained to rule out osteomyelitis.  Will obtain an MRI of sacrum today. -Pain management with as needed morphine, Tylenol.  Stopped scheduled ibuprofen while on Vanco Zosyn for renal protection.  Multiple sclerosis -stable -Patient bedbound at baseline with no function of bilateral lower  extremities. -Continue home gabapentin, baclofen, Robaxin -PT evaluation  Essential hypertension -chronic, stable No longer on home med.  Monitor BP closely.  Hypokalemia -Potassium 3.1 today.  Replaced with 40 meq Oral.  Morbid obesity - Body mass index is 45.73 kg/m. Patient has been advised to make an attempt to improve diet and exercise patterns to aid in weight loss.  Mobility: PT eval ordered DVT prophylaxis:  Lovenox subcu Code Status:   Code Status: Full Code  Family Communication: none at bedside Expected Discharge:  Hopefully home in 2 to 3 days  Consultants:  Surgery  Procedures:    Antimicrobials: Anti-infectives (From admission, onward)   Start     Dose/Rate Route Frequency Ordered Stop   01/27/19 0330  vancomycin (VANCOCIN) 1,750 mg in sodium chloride 0.9 % 500 mL IVPB     1,750 mg 250 mL/hr over 120 Minutes Intravenous Every 24 hours 01/26/19 0945     01/26/19 0300  piperacillin-tazobactam (ZOSYN) IVPB 3.375 g     3.375 g 12.5 mL/hr over 240 Minutes Intravenous Every 8 hours 01/25/19 2126     01/26/19 0100  piperacillin-tazobactam (ZOSYN) IVPB 3.375 g     3.375 g 100 mL/hr over 30 Minutes Intravenous  Once 01/26/19 0053 01/26/19 0503   01/26/19 0100  vancomycin (VANCOCIN) IVPB 1000 mg/200 mL premix     1,000 mg 200 mL/hr over 60 Minutes Intravenous  Once 01/26/19 0053 01/26/19 0503   01/25/19 2130  vancomycin (VANCOCIN) 1,250 mg in sodium chloride 0.9 % 250 mL IVPB  Status:  Discontinued  1,250 mg 166.7 mL/hr over 90 Minutes Intravenous Every 12 hours 01/25/19 2126 01/26/19 0945   01/25/19 2030  vancomycin (VANCOCIN) IVPB 1000 mg/200 mL premix  Status:  Discontinued     1,000 mg 200 mL/hr over 60 Minutes Intravenous  Once 01/25/19 2022 01/25/19 2126   01/25/19 2030  piperacillin-tazobactam (ZOSYN) IVPB 3.375 g     3.375 g 100 mL/hr over 30 Minutes Intravenous  Once 01/25/19 2022 01/25/19 2136      Diet Order            Diet regular Room  service appropriate? Yes; Fluid consistency: Thin  Diet effective now              Infusions:  . piperacillin-tazobactam (ZOSYN)  IV 3.375 g (01/27/19 1415)  . vancomycin 1,750 mg (01/27/19 0414)    Scheduled Meds: . baclofen  10 mg Oral TID  . carvedilol  3.125 mg Oral BID WC  . enoxaparin (LOVENOX) injection  55 mg Subcutaneous Q24H  . feeding supplement (ENSURE ENLIVE)  237 mL Oral BID BM  . gabapentin  300 mg Oral TID  . Gerhardt's butt cream   Topical TID  . methocarbamol  500 mg Oral TID  . potassium chloride  40 mEq Oral Daily    PRN meds: acetaminophen, bisacodyl, gabapentin, hydrALAZINE, morphine injection, polyethylene glycol   Objective: Vitals:   01/27/19 0924 01/27/19 1224  BP: 134/71 129/79  Pulse: (!) 120 (!) 119  Resp: 18 20  Temp: 99.1 F (37.3 C) 98.6 F (37 C)  SpO2: 97% 100%    Intake/Output Summary (Last 24 hours) at 01/27/2019 1526 Last data filed at 01/27/2019 0012 Gross per 24 hour  Intake 23.4 ml  Output 400 ml  Net -376.6 ml   Filed Weights   01/25/19 2100  Weight: 113.4 kg   Weight change:  Body mass index is 45.73 kg/m.   Physical Exam: General exam: Appears calm and comfortable.  Skin: No rashes, lesions or ulcers. HEENT: Atraumatic, normocephalic, supple neck, no obvious bleeding Lungs: Clear to auscultation bilaterally CVS: Regular rate and rhythm, no murmur GI/Abd soft, nontender, nondistended, bowel sound present CNS: Alert, awake, oriented x3 Psychiatry: Depressed mood Extremities: No pedal edema, no calf tenderness  Data Review: I have personally reviewed the laboratory data and studies available.  Recent Labs  Lab 01/25/19 1650 01/26/19 0358 01/27/19 0409  WBC 11.3* 9.2 8.9  NEUTROABS 7.2  --  4.2  HGB 13.8 12.5 11.6*  HCT 43.6 40.6 36.0  MCV 99.5 102.3* 98.1  PLT 291 265 230   Recent Labs  Lab 01/25/19 1650 01/26/19 0358 01/27/19 0409  NA 138 140 140  K 3.2* 3.0* 3.1*  CL 100 105 103  CO2 25 23  27   GLUCOSE 120* 127* 98  BUN 15 10 8   CREATININE 0.91 0.62 0.57  CALCIUM 8.9 8.3* 8.7*  MG  --   --  1.7    Terrilee Croak, MD  [facility] 01/27/2019

## 2019-01-28 DIAGNOSIS — M4628 Osteomyelitis of vertebra, sacral and sacrococcygeal region: Secondary | ICD-10-CM

## 2019-01-28 LAB — BASIC METABOLIC PANEL
Anion gap: 10 (ref 5–15)
BUN: 6 mg/dL (ref 6–20)
CO2: 27 mmol/L (ref 22–32)
Calcium: 8.4 mg/dL — ABNORMAL LOW (ref 8.9–10.3)
Chloride: 103 mmol/L (ref 98–111)
Creatinine, Ser: 0.52 mg/dL (ref 0.44–1.00)
GFR calc Af Amer: >60 mL/min (ref >60)
GFR calc non Af Amer: >60 mL/min (ref >60)
Glucose, Bld: 96 mg/dL (ref 70–99)
Potassium: 3.3 mmol/L — ABNORMAL LOW (ref 3.5–5.1)
Sodium: 140 mmol/L (ref 135–145)

## 2019-01-28 MED ORDER — SODIUM CHLORIDE 0.9 % IV SOLN
2.0000 g | INTRAVENOUS | Status: DC
  Administered 2019-01-28 – 2019-02-02 (×6): 2 g via INTRAVENOUS
  Filled 2019-01-28: qty 20
  Filled 2019-01-28 (×2): qty 2
  Filled 2019-01-28 (×2): qty 20
  Filled 2019-01-28: qty 2

## 2019-01-28 NOTE — Progress Notes (Signed)
PHARMACY CONSULT NOTE FOR:  OUTPATIENT  PARENTERAL ANTIBIOTIC THERAPY (OPAT)  Indication: Sacral osteomyelitis Regimen: Vancomycin 1,750mg  IV Q24H and Ceftriaxone 2g IV Q24H End date: 03/11/2019  IV antibiotic discharge orders are pended. To discharging provider:  please sign these orders via discharge navigator,  Select New Orders & click on the button choice - Manage This Unsigned Work.     Thank you for allowing pharmacy to be a part of this patient's care.  Kennon Holter, PharmD PGY1 Ambulatory Care Pharmacy Resident [employer] Phone: [phone] 01/28/2019, 2:45 PM

## 2019-01-28 NOTE — Consult Note (Signed)
[facility]    Date of Admission:  01/25/2019     Total days of antibiotics                Reason for Consult: Sacral osteomyelitis   Referring Provider: [place] Primary Care Provider: Alvester Chou, NP   ASSESSMENT:  Ms. Frank is a 48 year old female with multiple sclerosis complicated by being bedbound admitted with sacral decubitus ulcer with MRI concerning for osteomyelitis and probing of the wound going down to the bone.  Inflammatory markers elevated with ESR of 71 and CRP of 13.1.  Blood cultures obtained on admission without growth to date and no other cultures have been obtained.  Surgery has evaluated with recommendation for hydrotherapy while inpatient.  Change current antimicrobial therapy to vancomycin and ceftriaxone for broad-spectrum coverage.  Will need at least 6 weeks of IV therapy via PICC line.  Discussed importance of offloading the site with frequent turning as well as optimizing nutrition.  Will likely need continued wound care following discharge.  PLAN:  1. Change antimicrobial therapy to vancomycin and ceftriaxone. 2. Offload site with frequent turning 3. Dressing change and wound care per primary team. 4. Optimize nutrition as able to allow for healing. 5. Will need PICC line. 6. Outpatient antibiotic orders placed below.   Diagnosis: Sacral osteomyelitis  Culture Result: Culture-negative  Allergies  Allergen Reactions  . Tizanidine Diarrhea  . Tomato     OPAT Orders Discharge antibiotics: Vancomycin and ceftriaxone Per pharmacy protocol  Aim for Vancomycin trough 15-20 or AUC 400-550 (unless otherwise indicated) Duration: 6 weeks End Date: 03/11/2019  [facility] Care Per Protocol:  Labs weekly while on IV antibiotics: _X_ CBC with differential _X_ BMP (BIWEEKLY) __ CMP _X_ CRP _X_ ESR _X_ Vancomycin trough __ CK  __ Please pull PIC at completion of IV antibiotics _X_ Please leave PIC in place until  doctor has seen patient or been notified  Fax weekly labs to [phone]  Clinic Follow Up Appt:  02/10/2019 at 2:45 PM with Terri Piedra, NP   Principal Problem:   Sacral osteomyelitis ([place]) Active Problems:   Multiple sclerosis ([place])   Essential hypertension, benign   Stage 4 decubitus ulcer ([place])   Cellulitis   . baclofen  10 mg Oral TID  . carvedilol  3.125 mg Oral BID WC  . enoxaparin (LOVENOX) injection  55 mg Subcutaneous Q24H  . feeding supplement (ENSURE ENLIVE)  237 mL Oral BID BM  . gabapentin  300 mg Oral TID  . Gerhardt's butt cream   Topical TID  . methocarbamol  500 mg Oral TID  . potassium chloride  40 mEq Oral Daily     HPI: BABBETTE DALESANDRO is a 48 y.o. female with previous history of multiple sclerosis resulting in her being bedbound with known sacral decubitus ulcer being treated by home health admitted with worsening pain and bleeding concerning for infection.  In the ED found to be afebrile and tachycardic.  Lab work with white blood cell count of 9.2 with elevated sed rate of 71 and CRP of 13.1.  SARS-CoV-2 testing negative.  MRI imaging with possible osteomyelitis of the distal sacrum just above the coccyx with no discrete abscess.  Blood cultures obtained on admission without growth to date.  Started on vancomycin and piperacillin-tazobactam for broad-spectrum coverage.  She has been afebrile since admission with no leukocytosis.  Surgery consulted with no indication for surgical debridement at the present time. Recommending hydrotherapy while  inpatient.  ID consulted for antibiotic recommendations.  Ms. Muhlenkamp currently lives with her parents and has been experiencing a sacral decubitus ulcer for the past several months with worsening most recently in the last 2 to 3 weeks having increased bleeding and foul odor per her home health nurses.  Denies having had fevers, chills, or sweats.  Has had increase in pain located in her sacrum.  Tries to turn as  frequently as possible but has difficulty due to issues with her hands.  Wound care was provided by home health services and intermittently by her parents as well as other caregivers.  She has not received antibiotics recently prior to admission.  Review of Systems: Review of Systems  Constitutional: Negative for chills, fever and weight loss.  Respiratory: Negative for cough, shortness of breath and wheezing.   Cardiovascular: Negative for chest pain and leg swelling.  Gastrointestinal: Negative for abdominal pain, constipation, diarrhea, nausea and vomiting.  Skin: Negative for rash.     Past Medical History:  Diagnosis Date  . Arthritis   . MS (multiple sclerosis) (HCC)     Social History   Tobacco Use  . Smoking status: Former Smoker    Packs/day: 2.00    Years: 20.00    Pack years: 40.00    Quit date: 07/03/2008    Years since quitting: 10.5  . Smokeless tobacco: Current User  . Tobacco comment: USES ELECTRONIC CIGARETTES  Substance Use Topics  . Alcohol use: No  . Drug use: No    History reviewed. No pertinent family history.  Allergies  Allergen Reactions  . Tizanidine Diarrhea  . Tomato     OBJECTIVE: Blood pressure 120/68, pulse (!) 104, temperature 98.8 F (37.1 C), temperature source Oral, resp. rate 16, height 5' 2"  (1.575 m), weight 113.4 kg, SpO2 98 %.  Physical Exam Constitutional:      General: She is not in acute distress.    Appearance: She is well-developed. She is obese.     Comments: Lying in bed with head of bed elevated; pleasant  Cardiovascular:     Rate and Rhythm: Normal rate and regular rhythm.     Heart sounds: Normal heart sounds.  Pulmonary:     Effort: Pulmonary effort is normal.     Breath sounds: Normal breath sounds.  Musculoskeletal:     Comments: Sacral decubitus ulcer located on left side with probing down to the bone with cotton tip applicator.  Skin:    General: Skin is warm and dry.  Neurological:     Mental Status:  She is alert and oriented to person, place, and time.  Psychiatric:        Mood and Affect: Mood normal.     Lab Results Lab Results  Component Value Date   WBC 8.9 01/27/2019   HGB 11.6 (L) 01/27/2019   HCT 36.0 01/27/2019   MCV 98.1 01/27/2019   PLT 230 01/27/2019    Lab Results  Component Value Date   CREATININE 0.52 01/28/2019   BUN 6 01/28/2019   NA 140 01/28/2019   K 3.3 (L) 01/28/2019   CL 103 01/28/2019   CO2 27 01/28/2019    Lab Results  Component Value Date   ALT 15 01/25/2019   AST 37 01/25/2019   ALKPHOS 112 01/25/2019   BILITOT 0.5 01/25/2019     Microbiology: Recent Results (from the past 240 hour(s))  Blood culture (routine x 2)     Status: None (Preliminary result)   Collection  Time: 01/25/19  8:40 PM   Specimen: BLOOD  Result Value Ref Range Status   Specimen Description BLOOD RIGHT ANTECUBITAL  Final   Special Requests   Final    BOTTLES DRAWN AEROBIC ONLY Blood Culture results may not be optimal due to an inadequate volume of blood received in culture bottles   Culture   Final    NO GROWTH 3 DAYS Performed at [facility], [place] [address], [place], [place] [address]    Report Status PENDING  Incomplete  Blood culture (routine x 2)     Status: None (Preliminary result)   Collection Time: 01/25/19  8:40 PM   Specimen: BLOOD  Result Value Ref Range Status   Specimen Description BLOOD BLOOD RIGHT FOREARM  Final   Special Requests   Final    BOTTLES DRAWN AEROBIC AND ANAEROBIC Blood Culture results may not be optimal due to an inadequate volume of blood received in culture bottles   Culture   Final    NO GROWTH 3 DAYS Performed at [facility], [place] [address], [place], [place] [address]    Report Status PENDING  Incomplete  SARS CORONAVIRUS 2 (TAT 6-24 HRS) Nasopharyngeal Nasopharyngeal Swab     Status: None   Collection Time: 01/25/19 10:35 PM   Specimen: Nasopharyngeal Swab  Result Value Ref Range Status   SARS Coronavirus 2  NEGATIVE NEGATIVE Final    Comment: (NOTE) SARS-CoV-2 target nucleic acids are NOT DETECTED. The SARS-CoV-2 RNA is generally detectable in upper and lower respiratory specimens during the acute phase of infection. Negative results do not preclude SARS-CoV-2 infection, do not rule out co-infections with other pathogens, and should not be used as the sole basis for treatment or other patient management decisions. Negative results must be combined with clinical observations, patient history, and epidemiological information. The expected result is Negative. Fact Sheet for Patients: SugarRoll.be Fact Sheet for Healthcare Providers: https://www.woods-mathews.com/ This test is not yet approved or cleared by the [place] FDA and  has been authorized for detection and/or diagnosis of SARS-CoV-2 by FDA under an Emergency Use Authorization (EUA). This EUA will remain  in effect (meaning this test can be used) for the duration of the COVID-19 declaration under Section 56 4(b)(1) of the Act, 21 U.S.C. section 360bbb-3(b)(1), unless the authorization is terminated or revoked sooner. Performed at [facility], [place] [address], [place], [place] [address]      Terri Piedra, [place] [facility] [phone] Pager  01/28/2019  1:44 PM

## 2019-01-28 NOTE — Progress Notes (Signed)
PROGRESS NOTE  [name]  DOB: 02/17/1971  PCP: Marletta Lor, NP FXT:[id number]  DOA: 01/25/2019  LOS: 3 days   Chief Complaint  Patient presents with  . Wound Infection   Brief narrative: [name] is a 48 y.o. female with PMH of Multiple Sclerosis with a known sacral decubitus ulcer followed by home health 3x per week.  Patient was sent to the ED on 11/16 by her home health RN was concerned because of the degree of pain, bleeding and foul smell from the wound. At baseline, patient has paraparesis, is bedbound, has bladder incontinence. She uses air mattress at home.   In the ED, labs showed WBC 11k, lactate 3.3.   Blood cultures obtained.  Started on broad-spectrum antibiotics with Vanco and Zosyn. Patient was admitted to hospitalist medicine service.  Subjective: Patient was seen and examined this morning.  Middle-aged Caucasian female.  Lying down in bed.  Not in distress.  No new symptoms. MRI sacrum was obtained yesterday, result as below. No fever last 24 hours, tachycardic between 100-110 O2 sat 100% on room air.  Assessment/Plan: Stage IV decubitus ulcer, present on admission Cellulitis associated with above Possible sacral osteomyelitis -Known stage IV sacral ulcer present since at least May 2020, followed by home health.   -On admission, WBC 11.3, lactic 3.3, tachycardic.  Not overtly septic.   -Repeat lactic acid level was down to 2.3.   -Surgical consultation was obtained.  No debridement was recommended.   -Wound care nurse following.  May need wound VAC.   -11/18, MRI sacrum was obtained which showed possible osteomyelitis of the distal sacrum just above the coccyx.  Sacral fracture could have the same appearance. -Discussed with orthopedic PA as well as general surgery.  ID consult recommended. -No growth in blood cultures so far.   -Currently on IV vancomycin and IV Zosyn.   -Pain management with as needed morphine, Tylenol.  Stopped scheduled  ibuprofen while on Vanco Zosyn for renal protection.  Multiple sclerosis -stable -Patient bedbound at baseline with no function of bilateral lower extremities. -Continue home gabapentin, baclofen, Robaxin -PT evaluation  Essential hypertension - chronic, stable No longer on home med.  Monitor BP closely.  Hypokalemia -Potassium 3.3 today.  11/18, started on potassium 40 mEq oral daily  Morbid obesity - Body mass index is 45.73 kg/m. Patient has been advised to make an attempt to improve diet and exercise patterns to aid in weight loss.  Mobility: PT eval obtained.  Recommended SNF. DVT prophylaxis:  Lovenox subcu Code Status:   Code Status: Full Code  Family Communication: none at bedside Expected Discharge:  PT recommended SNF.  Patient prefers to go home.  Not ready yet because of new finding of osteomyelitis sacrum in the MRI  Consultants:  Surgery  Procedures:    Antimicrobials: Anti-infectives (From admission, onward)   Start     Dose/Rate Route Frequency Ordered Stop   01/27/19 0330  vancomycin (VANCOCIN) 1,750 mg in sodium chloride 0.9 % 500 mL IVPB     1,750 mg 250 mL/hr over 120 Minutes Intravenous Every 24 hours 01/26/19 0945     01/26/19 0300  piperacillin-tazobactam (ZOSYN) IVPB 3.375 g     3.375 g 12.5 mL/hr over 240 Minutes Intravenous Every 8 hours 01/25/19 2126     01/26/19 0100  piperacillin-tazobactam (ZOSYN) IVPB 3.375 g     3.375 g 100 mL/hr over 30 Minutes Intravenous  Once 01/26/19 0053 01/26/19 0503   01/26/19 0100  vancomycin (  VANCOCIN) IVPB 1000 mg/200 mL premix     1,000 mg 200 mL/hr over 60 Minutes Intravenous  Once 01/26/19 0053 01/26/19 0503   01/25/19 2130  vancomycin (VANCOCIN) 1,250 mg in sodium chloride 0.9 % 250 mL IVPB  Status:  Discontinued     1,250 mg 166.7 mL/hr over 90 Minutes Intravenous Every 12 hours 01/25/19 2126 01/26/19 0945   01/25/19 2030  vancomycin (VANCOCIN) IVPB 1000 mg/200 mL premix  Status:  Discontinued      1,000 mg 200 mL/hr over 60 Minutes Intravenous  Once 01/25/19 2022 01/25/19 2126   01/25/19 2030  piperacillin-tazobactam (ZOSYN) IVPB 3.375 g     3.375 g 100 mL/hr over 30 Minutes Intravenous  Once 01/25/19 2022 01/25/19 2136      Diet Order            Diet regular Room service appropriate? Yes; Fluid consistency: Thin  Diet effective now              Infusions:  . piperacillin-tazobactam (ZOSYN)  IV Stopped (01/28/19 0931)  . vancomycin Stopped (01/28/19 0527)    Scheduled Meds: . baclofen  10 mg Oral TID  . carvedilol  3.125 mg Oral BID WC  . enoxaparin (LOVENOX) injection  55 mg Subcutaneous Q24H  . feeding supplement (ENSURE ENLIVE)  237 mL Oral BID BM  . gabapentin  300 mg Oral TID  . Gerhardt's butt cream   Topical TID  . methocarbamol  500 mg Oral TID  . potassium chloride  40 mEq Oral Daily    PRN meds: acetaminophen, bisacodyl, gabapentin, hydrALAZINE, morphine injection, polyethylene glycol   Objective: Vitals:   01/28/19 0811 01/28/19 1142  BP: (!) 152/94 120/68  Pulse:  (!) 104  Resp:  16  Temp:  98.8 F (37.1 C)  SpO2:  98%    Intake/Output Summary (Last 24 hours) at 01/28/2019 1329 Last data filed at 01/28/2019 1153 Gross per 24 hour  Intake 1340.98 ml  Output 600 ml  Net 740.98 ml   Filed Weights   01/25/19 2100  Weight: 113.4 kg   Weight change:  Body mass index is 45.73 kg/m.   Physical Exam:  General exam: Appears calm and comfortable.  Skin: No rashes, lesions or ulcers. HEENT: Atraumatic, normocephalic, supple neck, no obvious bleeding Lungs: Clear to auscultation bilaterally CVS: Regular rate and rhythm, no murmur GI/Abd soft, nontender, nondistended, bowel sound present CNS: Alert, awake, oriented x3 Psychiatry: Depressed mood Extremities: No pedal edema, no calf tenderness.   Data Review: I have personally reviewed the laboratory data and studies available.  Recent Labs  Lab 01/25/19 1650 01/26/19 0358 01/27/19  0409  WBC 11.3* 9.2 8.9  NEUTROABS 7.2  --  4.2  HGB 13.8 12.5 11.6*  HCT 43.6 40.6 36.0  MCV 99.5 102.3* 98.1  PLT 291 265 230   Recent Labs  Lab 01/25/19 1650 01/26/19 0358 01/27/19 0409 01/28/19 0447  NA 138 140 140 140  K 3.2* 3.0* 3.1* 3.3*  CL 100 105 103 103  CO2 25 23 27 27   GLUCOSE 120* 127* 98 96  BUN 15 10 8 6   CREATININE 0.91 0.62 0.57 0.52  CALCIUM 8.9 8.3* 8.7* 8.4*  MG  --   --  1.7  --     Terrilee Croak, MD  [facility] 01/28/2019

## 2019-01-28 NOTE — Care Management Important Message (Signed)
Important Message  Patient Details  Name: [name] MRN: [id number] Date of Birth: 1970/07/12   Medicare Important Message Given:  Yes     [name] 01/28/2019, 12:40 PM

## 2019-01-28 NOTE — Plan of Care (Signed)

## 2019-01-28 NOTE — Plan of Care (Signed)
Patient completed 75% of breakfast meal and has snacks at the bedside.

## 2019-01-29 ENCOUNTER — Inpatient Hospital Stay: Payer: Self-pay

## 2019-01-29 DIAGNOSIS — M4628 Osteomyelitis of vertebra, sacral and sacrococcygeal region: Principal | ICD-10-CM

## 2019-01-29 LAB — BASIC METABOLIC PANEL
Anion gap: 10 (ref 5–15)
BUN: 9 mg/dL (ref 6–20)
CO2: 27 mmol/L (ref 22–32)
Calcium: 8.8 mg/dL — ABNORMAL LOW (ref 8.9–10.3)
Chloride: 103 mmol/L (ref 98–111)
Creatinine, Ser: 0.8 mg/dL (ref 0.44–1.00)
GFR calc Af Amer: >60 mL/min (ref >60)
GFR calc non Af Amer: >60 mL/min (ref >60)
Glucose, Bld: 90 mg/dL (ref 70–99)
Potassium: 4.2 mmol/L (ref 3.5–5.1)
Sodium: 140 mmol/L (ref 135–145)

## 2019-01-29 MED ORDER — SODIUM CHLORIDE 0.9% FLUSH
10.0000 mL | Freq: Two times a day (BID) | INTRAVENOUS | Status: DC
  Administered 2019-01-29 – 2019-02-02 (×7): 10 mL

## 2019-01-29 MED ORDER — SODIUM CHLORIDE 0.9% FLUSH: 10.0000 mL | INTRAVENOUS | Status: DC | PRN

## 2019-01-29 MED ORDER — CHLORHEXIDINE GLUCONATE CLOTH 2 % EX PADS
6.0000 | MEDICATED_PAD | Freq: Every day | CUTANEOUS | Status: DC
  Administered 2019-01-29 – 2019-02-02 (×5): 6 via TOPICAL

## 2019-01-29 MED ORDER — POTASSIUM CHLORIDE CRYS ER 10 MEQ PO TBCR
10.0000 meq | EXTENDED_RELEASE_TABLET | Freq: Every day | ORAL | Status: DC
  Administered 2019-01-29 – 2019-02-02 (×5): 10 meq via ORAL
  Filled 2019-01-29 (×5): qty 1

## 2019-01-29 NOTE — Progress Notes (Signed)
Physical Therapy Treatment Patient Details Name: [name] MRN: [id number] DOB: 26-Nov-1970 Today's Date: 01/29/2019    History of Present Illness Pt is a 48 y/o F admitted on 01/25/19 for a worsening sacral decubitus ulcer.  Pt with PMH significant for MS & HTN. Pt is bedbound at baseline & has [facility] RN.    PT Comments    Pt performing bed mobility I.e. rolling with mod-maximal assist to right and left. Pt reporting being limited due to acute bilateral hand paresthesias associated with pain. Education provided re: pressure relief and turning schedule, desensitization techniques, cervical retractions for postural re-education. Pt states her parents can provide required level of assist.    Follow Up Recommendations  SNF     Equipment Recommendations  Wheelchair (measurements PT);Other (comment)(Roho cushion)    Recommendations for Other Services       Precautions / Restrictions Precautions Precautions: Fall Restrictions Weight Bearing Restrictions: No    Mobility  Bed Mobility Overal bed mobility: Needs Assistance Bed Mobility: Rolling Rolling: Mod assist;Max assist         General bed mobility comments: rolling L<>R with bed rails with mod-max assist, pt uses momentum, increased pain  Transfers                    Ambulation/Gait                 Stairs             Wheelchair Mobility    Modified Rankin (Stroke Patients Only)       Balance                                            Cognition Arousal/Alertness: Awake/alert Behavior During Therapy: WFL for tasks assessed/performed Overall Cognitive Status: Within Functional Limits for tasks assessed                                 General Comments: Drowsiness could have had an impact on attention span today      Exercises  Cervical retractions x 5    General Comments        Pertinent Vitals/Pain Pain Assessment: Faces Faces Pain Scale:  Hurts little more Pain Location: hand paresthesias Pain Descriptors / Indicators: Numbness Pain Intervention(s): Monitored during session    Home Living                      Prior Function            PT Goals (current goals can now be found in the care plan section) Acute Rehab PT Goals Patient Stated Goal: "use my hands better."    Frequency    Min 2X/week      PT Plan Current plan remains appropriate    Co-evaluation              AM-PAC PT "6 Clicks" Mobility   Outcome Measure  Help needed turning from your back to your side while in a flat bed without using bedrails?: A Lot Help needed moving from lying on your back to sitting on the side of a flat bed without using bedrails?: Total Help needed moving to and from a bed to a chair (including a wheelchair)?: Total Help needed standing up from a chair using your  arms (e.g., wheelchair or bedside chair)?: Total Help needed to walk in hospital room?: Total Help needed climbing 3-5 steps with a railing? : Total 6 Click Score: 7    End of Session   Activity Tolerance: Patient tolerated treatment well Patient left: in bed;with call bell/phone within reach   PT Visit Diagnosis: Muscle weakness (generalized) (M62.81)     Time: 7116-5790 PT Time Calculation (min) (ACUTE ONLY): 30 min  Charges:  $Therapeutic Activity: 8-22 mins $Self Care/Home Management: 8-22                     Ellamae Sia, PT, DPT Acute Rehabilitation Services Pager [phone] Office [phone]    [name] 01/29/2019, 4:40 PM

## 2019-01-29 NOTE — TOC Progression Note (Signed)
Transition of Care [facility]) - Progression Note    Patient Details  Name: [name] MRN: [id number] Date of Birth: 05-09-1970  Transition of Care [facility]) CM/SW Contact  Pollie Friar, RN Phone Number: 01/29/2019, 11:53 AM  Clinical Narrative:    Per MD patient may be ready for d/c home tomorrow. CM has spoken to patient and mother over the phone. Plan is to d/c home and not SNF d/t concerns over covid.  Mother and father are available to come up tomorrow to learn dressing change prior to d/c.  [employer] will supply the IV abx. They will reach out to the family and provide the abx to the home.  Pt was active with [facility] prior to admission and wants to continue with them. Drew with [facility] is aware of admission and needs at d/c. CM awaiting to hear from [facility] if they will be able to see the patient over the weekend.  Pt with orders for specialty air mattress, wheelchair and special cushions. Zack with [employer] aware and following for delivery tomorrow. Pt will require PTAR home and needs 3 people to navigate her up the stairs at home.     Expected Discharge Plan: [place] Barriers to Discharge: Continued Medical Work up  Expected Discharge Plan and Services Expected Discharge Plan: [place]   Discharge Planning Services: CM Consult   Living arrangements for the past 2 months: Single Family Home                                       Social Determinants of Health (SDOH) Interventions    Readmission Risk Interventions No flowsheet data found.

## 2019-01-29 NOTE — Progress Notes (Signed)
PROGRESS NOTE  [name]  DOB: 10/02/1970  PCP: Marletta LorBarr, Julie, NP ZOX:[id number]  DOA: 01/25/2019  LOS: 4 days   Chief Complaint  Patient presents with  . Wound Infection   Brief narrative: [name] is a 48 y.o. female with PMH of Multiple Sclerosis with a known sacral decubitus ulcer followed by home health 3x per week.  Patient was sent to the ED on 11/16 by her home health RN was concerned because of the degree of pain, bleeding and foul smell from the wound. At baseline, patient has paraparesis, is bedbound, has bladder incontinence. She uses air mattress at home.   In the ED, labs showed WBC 11k, lactate 3.3.   Blood cultures obtained.  Started on broad-spectrum antibiotics with Vanco and Zosyn. Patient was admitted to hospitalist medicine service.  Subjective: Patient was seen and examined this morning.  Lying down in bed.  Not in distress. Continues to have mild tachycardia at rest.  Assessment/Plan: Stage IV decubitus ulcer, present on admission Cellulitis associated with above Possible sacral osteomyelitis -Known stage IV sacral ulcer present since at least May 2020, followed by home health.   -On admission, WBC 11.3, lactic 3.3, tachycardic.  Not overtly septic.   -Surgical consultation was obtained.  No debridement was recommended.   -Local wound care provided.  With antibiotics, WBC count normalized, lactic acid trended down to normal. -11/18, MRI sacrum was obtained which showed possible osteomyelitis of the distal sacrum just above the coccyx.  Sacral fracture could have the same appearance. -Discussed with orthopedic PA as well as general surgery.  No need of debridement at this time.  ID consult was obtained.  Recommended 6 weeks course of IV Rocephin and IV vancomycin. -PICC team consulted today for PICC line placement.  Needs home health at discharge. -No growth in blood cultures so far.   -Pain management with as needed morphine, Tylenol.    Multiple sclerosis -stable -Patient bedbound at baseline with no function of bilateral lower extremities. -Continue home gabapentin, baclofen, Robaxin -PT evaluation obtained.  SNF recommended.  Patient would prefer home.  Tachycardia/hypertension -Patient has remained mildly tachycardic in the hospital.  Has history of hypertension but recently not on blood pressure medicines.  Blood pressure has remained stable in the hospital.  I started the patient on Coreg 3.125 mg yesterday.  Continue to monitor heart rate and blood pressure.  Hypokalemia -Improved with replacement.  Morbid obesity - Body mass index is 45.73 kg/m. Patient has been advised to make an attempt to improve diet and exercise patterns to aid in weight loss.  Mobility: PT eval obtained.  Recommended SNF. DVT prophylaxis:  Lovenox subcu Code Status:   Code Status: Full Code  Family Communication: none at bedside Expected Discharge:  PT recommended SNF.  Patient prefers to go home.  Long-term IV antibiotics recommended by infectious disease.  Plan to discharge home with home health tomorrow.  Consultants:  Surgery, orthopedics over the phone, infectious disease  Procedures:    Antimicrobials: Anti-infectives (From admission, onward)   Start     Dose/Rate Route Frequency Ordered Stop   01/28/19 1345  cefTRIAXone (ROCEPHIN) 2 g in sodium chloride 0.9 % 100 mL IVPB     2 g 200 mL/hr over 30 Minutes Intravenous Every 24 hours 01/28/19 1333     01/27/19 0330  vancomycin (VANCOCIN) 1,750 mg in sodium chloride 0.9 % 500 mL IVPB     1,750 mg 250 mL/hr over 120 Minutes Intravenous Every 24 hours  01/26/19 0945     01/26/19 0300  piperacillin-tazobactam (ZOSYN) IVPB 3.375 g  Status:  Discontinued     3.375 g 12.5 mL/hr over 240 Minutes Intravenous Every 8 hours 01/25/19 2126 01/28/19 1332   01/26/19 0100  piperacillin-tazobactam (ZOSYN) IVPB 3.375 g     3.375 g 100 mL/hr over 30 Minutes Intravenous  Once 01/26/19 0053  01/26/19 0503   01/26/19 0100  vancomycin (VANCOCIN) IVPB 1000 mg/200 mL premix     1,000 mg 200 mL/hr over 60 Minutes Intravenous  Once 01/26/19 0053 01/26/19 0503   01/25/19 2130  vancomycin (VANCOCIN) 1,250 mg in sodium chloride 0.9 % 250 mL IVPB  Status:  Discontinued     1,250 mg 166.7 mL/hr over 90 Minutes Intravenous Every 12 hours 01/25/19 2126 01/26/19 0945   01/25/19 2030  vancomycin (VANCOCIN) IVPB 1000 mg/200 mL premix  Status:  Discontinued     1,000 mg 200 mL/hr over 60 Minutes Intravenous  Once 01/25/19 2022 01/25/19 2126   01/25/19 2030  piperacillin-tazobactam (ZOSYN) IVPB 3.375 g     3.375 g 100 mL/hr over 30 Minutes Intravenous  Once 01/25/19 2022 01/25/19 2136      Diet Order            Diet regular Room service appropriate? Yes; Fluid consistency: Thin  Diet effective now              Infusions:  . cefTRIAXone (ROCEPHIN)  IV 200 mL/hr at 01/28/19 1538  . vancomycin 1,750 mg (01/29/19 0328)    Scheduled Meds: . baclofen  10 mg Oral TID  . carvedilol  3.125 mg Oral BID WC  . enoxaparin (LOVENOX) injection  55 mg Subcutaneous Q24H  . feeding supplement (ENSURE ENLIVE)  237 mL Oral BID BM  . gabapentin  300 mg Oral TID  . Gerhardt's butt cream   Topical TID  . methocarbamol  500 mg Oral TID  . potassium chloride  10 mEq Oral Daily    PRN meds: acetaminophen, bisacodyl, gabapentin, hydrALAZINE, morphine injection, polyethylene glycol   Objective: Vitals:   01/29/19 0333 01/29/19 0824  BP: 127/76 130/82  Pulse: (!) 105 (!) 102  Resp: 18 18  Temp: 98.3 F (36.8 C) 98.7 F (37.1 C)  SpO2: 95% 96%    Intake/Output Summary (Last 24 hours) at 01/29/2019 0856 Last data filed at 01/29/2019 0349 Gross per 24 hour  Intake 1364.55 ml  Output 1200 ml  Net 164.55 ml   Filed Weights   01/25/19 2100  Weight: 113.4 kg   Weight change:  Body mass index is 45.73 kg/m.   Physical Exam:  General exam: Appears calm and comfortable.  Skin: No rashes,  lesions or ulcers. HEENT: Atraumatic, normocephalic, supple neck, no obvious bleeding Lungs: Clear to auscultation bilaterally.  Not on oxygen supplementation. CVS: Regular rate and rhythm, no murmur GI/Abd soft, nontender, nondistended, bowel sound present CNS: Alert, awake, oriented x3 Psychiatry: Depressed mood Extremities: No pedal edema, no calf tenderness  Data Review: I have personally reviewed the laboratory data and studies available.  Recent Labs  Lab 01/25/19 1650 01/26/19 0358 01/27/19 0409  WBC 11.3* 9.2 8.9  NEUTROABS 7.2  --  4.2  HGB 13.8 12.5 11.6*  HCT 43.6 40.6 36.0  MCV 99.5 102.3* 98.1  PLT 291 265 230   Recent Labs  Lab 01/25/19 1650 01/26/19 0358 01/27/19 0409 01/28/19 0447 01/29/19 0232  NA 138 140 140 140 140  K 3.2* 3.0* 3.1* 3.3* 4.2  CL 100  105 103 103 103  CO2 25 23 27 27 27   GLUCOSE 120* 127* 98 96 90  BUN 15 10 8 6 9   CREATININE 0.91 0.62 0.57 0.52 0.80  CALCIUM 8.9 8.3* 8.7* 8.4* 8.8*  MG  --   --  1.7  --   --     , MD  [facility] 01/29/2019

## 2019-01-29 NOTE — Progress Notes (Signed)
Physical Therapy Wound Treatment Patient Details  Name: BRAYLIE BADAMI MRN: [id number] Date of Birth: [birth year]  Today's Date: 01/29/2019 Time: 1028-1100 Time Calculation (min): 32 min  Subjective  Subjective: Pt agreeable to hydrotherapy - with many complaints regarding pain Patient and Family Stated Goals: Heal wound, heal groin injury Date of Onset: (Unsure)  Pain Score:    Wound Assessment  Wound / Incision (Open or Dehisced) 01/26/19 Other (Comment) Buttocks Left Stg 4 (Active)  Dressing Type ABD;Barrier Film (skin prep);Gauze (Comment);Moist to dry 01/29/19 1743  Dressing Changed Changed 01/29/19 1743  Dressing Status Clean;Dry;Intact 01/29/19 1743  Dressing Change Frequency Daily 01/29/19 1743  Site / Wound Assessment Bleeding;Red;Yellow;Black 01/29/19 1743  % Wound base Red or Granulating 85% 01/29/19 1743  % Wound base Yellow/Fibrinous Exudate 5% 01/29/19 1743  % Wound base Black/Eschar 10% 01/29/19 1743  % Wound base Other/Granulation Tissue (Comment) 0% 01/29/19 1743  Peri-wound Assessment Intact;Erythema (blanchable) 01/29/19 1743  Wound Length (cm) 5 cm 01/27/19 1100  Wound Width (cm) 2.7 cm 01/27/19 1100  Wound Depth (cm) 4.5 cm 01/27/19 1100  Wound Volume (cm^3) 60.75 cm^3 01/27/19 1100  Wound Surface Area (cm^2) 13.5 cm^2 01/27/19 1100  Margins Unattached edges (unapproximated) 01/29/19 1743  Closure None 01/29/19 1743  Drainage Amount Moderate 01/29/19 1743  Drainage Description Sanguineous 01/29/19 1743  Treatment Cleansed;Hydrotherapy (Pulse lavage) 01/29/19 1743      Hydrotherapy Pulsed lavage therapy - wound location: L buttock Pulsed Lavage with Suction (psi): 12 psi Pulsed Lavage with Suction - Normal Saline Used: 1000 mL Pulsed Lavage Tip: Tip with splash shield Selective Debridement Selective Debridement - Location: L buttock Selective Debridement - Tools Used: Forceps(attempted with forceps but tissue too adherent and bleeding) Selective  Debridement - Tissue Removed: yellow nad black necrotic tissue   Wound Assessment and Plan  Wound Therapy - Assess/Plan/Recommendations Wound Therapy - Clinical Statement: Pt tolerated position better this tx.  More bleeding noted from base of wound and inferior border.  Attempted to assess for debridement with forceps and tissue remains adherent and bleeding.  Packed wound and dressed.  Will continue therapy to decrease bioburden and promote wound healing.  Wound Therapy - Functional Problem List: Decreased tolerance for position changes Factors Delaying/Impairing Wound Healing: Altered sensation;Incontinence;Multiple medical problems Hydrotherapy Plan: Debridement;Dressing change;Patient/family education;Pulsatile lavage with suction Wound Therapy - Frequency: 3X / week(MWF) Wound Therapy - Follow Up Recommendations: Skilled nursing facility(vs home health RN depending on d/c plan) Wound Plan: See above  Wound Therapy Goals- Improve the function of patient's integumentary system by progressing the wound(s) through the phases of wound healing (inflammation - proliferation - remodeling) by: Decrease Necrotic Tissue to: 0 Decrease Necrotic Tissue - Progress: Progressing toward goal Increase Granulation Tissue to: 100 Increase Granulation Tissue - Progress: Progressing toward goal Goals/treatment plan/discharge plan were made with and agreed upon by patient/family: Yes Time For Goal Achievement: 7 days Wound Therapy - Potential for Goals: Fair  Goals will be updated until maximal potential achieved or discharge criteria met.  Discharge criteria: when goals achieved, discharge from hospital, MD decision/surgical intervention, no progress towards goals, refusal/missing three consecutive treatments without notification or medical reason.  GP     Markeya Mincy Eli Hose 01/29/2019, 5:51 PM  Erasmo Leventhal , PTA Acute Rehabilitation Services Pager [phone] Office [phone]

## 2019-01-29 NOTE — Progress Notes (Cosign Needed)
Patient suffers from multiple sclerosis which impairs their ability to perform daily activities like ambulating, bathing in the home. A walker will not resolve  issue with performing activities of daily living. A wheelchair will allow patient to safely perform daily activities. Patient is not able to propel themselves in the home using a standard weight wheelchair due to weakness. Patient can self propel in the lightweight wheelchair. Length of need lifetime.  Accessories: elevating leg rests (ELRs), wheel locks, extensions and anti-tippers.

## 2019-01-29 NOTE — Progress Notes (Signed)
Peripherally Inserted Central Catheter/Midline Placement  The IV Nurse has discussed with the patient and/or persons authorized to consent for the patient, the purpose of this procedure and the potential benefits and risks involved with this procedure.  The benefits include less needle sticks, lab draws from the catheter, and the patient may be discharged home with the catheter. Risks include, but not limited to, infection, bleeding, blood clot (thrombus formation), and puncture of an artery; nerve damage and irregular heartbeat and possibility to perform a PICC exchange if needed/ordered by physician.  Alternatives to this procedure were also discussed.  Bard Power PICC patient education guide, fact sheet on infection prevention and patient information card has been provided to patient /or left at bedside.    PICC/Midline Placement Documentation  PICC Single Lumen 01/29/19 PICC Right Cephalic 35 cm 0 cm (Active)  Indication for Insertion or Continuance of Line Home intravenous therapies (PICC only) 01/29/19 2137  Exposed Catheter (cm) 0 cm 01/29/19 2137  Site Assessment Clean;Intact;Dry 01/29/19 2137  Line Status Flushed;Saline locked;Blood return noted 01/29/19 2137  Dressing Type Transparent 01/29/19 2137  Dressing Status Clean;Dry;Intact;Antimicrobial disc in place 01/29/19 2137  Dressing Change Due 02/05/19 01/29/19 2137       Gordan Payment 01/29/2019, 9:38 PM

## 2019-01-30 LAB — BASIC METABOLIC PANEL
Anion gap: 10 (ref 5–15)
BUN: 12 mg/dL (ref 6–20)
CO2: 27 mmol/L (ref 22–32)
Calcium: 8.8 mg/dL — ABNORMAL LOW (ref 8.9–10.3)
Chloride: 103 mmol/L (ref 98–111)
Creatinine, Ser: 0.89 mg/dL (ref 0.44–1.00)
GFR calc Af Amer: >60 mL/min (ref >60)
GFR calc non Af Amer: >60 mL/min (ref >60)
Glucose, Bld: 98 mg/dL (ref 70–99)
Potassium: 4.3 mmol/L (ref 3.5–5.1)
Sodium: 140 mmol/L (ref 135–145)

## 2019-01-30 LAB — CULTURE, BLOOD (ROUTINE X 2)
Culture: NO GROWTH
Culture: NO GROWTH

## 2019-01-30 LAB — VANCOMYCIN, TROUGH: Vancomycin Tr: 26 ug/mL (ref 15–20)

## 2019-01-30 LAB — VANCOMYCIN, PEAK: Vancomycin Pk: 55 ug/mL (ref 30–40)

## 2019-01-30 MED ORDER — CEFTRIAXONE IV (FOR PTA / DISCHARGE USE ONLY): 2.0000 g | INTRAVENOUS | 0 refills | Status: AC

## 2019-01-30 MED ORDER — VANCOMYCIN IV (FOR PTA / DISCHARGE USE ONLY): 1000.0000 mg | INTRAVENOUS | 0 refills | Status: AC

## 2019-01-30 MED ORDER — VANCOMYCIN HCL IN DEXTROSE 1-5 GM/200ML-% IV SOLN
1000.0000 mg | INTRAVENOUS | Status: DC
  Administered 2019-02-01 – 2019-02-02 (×2): 1000 mg via INTRAVENOUS
  Filled 2019-01-30 (×2): qty 200

## 2019-01-30 NOTE — Care Management (Addendum)
[facility] can deliver meds to the house today (pending OPAT order being adjusted to include dose, message sent to MD) [name] see patient until tomorrow. They will be doing the initial teaching of how to administer IV medications. Patient has 2pm Rocephin and midnight Vanc.  Patient will need her 2pm Rocephin at the hospital tomorrow then discharged. HH will see her for education, and family will administer vanc themselves. Verified w patient's mother that she was comfortable with the plan. Patient's mom states that equipment will be delivered by [employer] today around 33, and her other daughter will home to accept delivery. Mother planning on coming to hospital today at 76 to learn how to do dressing change.  Addend- pharmacy working on adjusting admin times to 11 for tomorrow for both meds. [place] made aware.

## 2019-01-30 NOTE — Progress Notes (Signed)
Lab called about patient's vancomycin Pk critical result of 55. Dr. Pietro Cassis notified and aware at 0800.

## 2019-01-30 NOTE — Progress Notes (Signed)
Dr. Kennon Holter informed of pt's vanc trough 26.

## 2019-01-30 NOTE — Plan of Care (Signed)

## 2019-01-30 NOTE — Progress Notes (Signed)
Pharmacy Antibiotic Note  [name] is a 48 y.o. female admitted on 01/25/2019 with cellulitis.  She is being discharged on 6 weeks of vancomycin and ceftriaxone for sacral osteomyelitis to end on 03/11/19  She had a vanc peak and trough drawn today with a peak of 55 and trough of 26 her AUC is calculated at 954. Her serum creatinine continues to increase today from 0.8>>0.89 and I expect it will be worse tomorrow. I will adjust her Vancomycin here as well as her OPAT order. Since she got her dose already today after the trough we will hold tomorrows dose before she is discharged and start 1000 mg q24 as an outpatient.   Plan: Ceftriaxone 2g q24h Change vancomycin to 1000 mg q24h Expected AUC: 545 SCr used: 0.89    Height: 5\' 2"  (157.5 cm) Weight: 250 lb (113.4 kg) IBW/kg (Calculated) : 50.1  Temp (24hrs), Avg:98.4 F (36.9 C), Min:98 F (36.7 C), Max:99.1 F (37.3 C)  Recent Labs  Lab 01/25/19 1650 01/25/19 2039 01/26/19 0358 01/27/19 0409 01/28/19 0447 01/29/19 0232 01/30/19 0258 01/30/19 0620  WBC 11.3*  --  9.2 8.9  --   --   --   --   CREATININE 0.91  --  0.62 0.57 0.52 0.80  --  0.89  LATICACIDVEN 3.3* 2.3* 1.9  --   --   --   --   --   VANCOTROUGH  --   --   --   --   --   --  26*  --   VANCOPEAK  --   --   --   --   --   --   --  55*    Estimated Creatinine Clearance: 90 mL/min (by C-G formula based on SCr of 0.89 mg/dL).    Allergies  Allergen Reactions  . Tizanidine Diarrhea  . Tomato     Antimicrobials this admission: 11/16 vancomycin >>  11/16 zosyn >> 11/19 11/19 ceftriaxone >>    Microbiology results: 11/16 BCx - ngtd 11/16 covid - neg   Nicoletta Dress, PharmD PGY2 Infectious Disease Pharmacy Resident  01/30/2019 11:09 AM

## 2019-01-30 NOTE — Progress Notes (Signed)
PROGRESS NOTE  [name]  DOB: 08/18/1970  PCP: Marletta Lor, NP UVO:[id number]  DOA: 01/25/2019  LOS: 5 days   Chief Complaint  Patient presents with  . Wound Infection   Brief narrative: [name] is a 48 y.o. female with PMH of Multiple Sclerosis with a known sacral decubitus ulcer followed by home health 3x per week.  Patient was sent to the ED on 11/16 by her home health RN was concerned because of the degree of pain, bleeding and foul smell from the wound. At baseline, patient has paraparesis, is bedbound, has bladder incontinence. She uses air mattress at home.   In the ED, labs showed WBC 11k, lactate 3.3.   Blood cultures obtained.  Started on broad-spectrum antibiotics with Vanco and Zosyn. Patient was admitted to hospitalist medicine service.  Subjective: Patient was seen and examined this morning.  Lying down in bed.  Not in distress.  No new symptoms. Tachycardia improving.  Assessment/Plan: Stage IV decubitus ulcer, present on admission Cellulitis associated with above Possible sacral osteomyelitis -Known stage IV sacral ulcer present since at least May 2020, followed by home health.   -On admission, WBC 11.3, lactic 3.3, tachycardic. Not overtly septic.   -Surgical consultation was obtained.  No debridement was recommended.   -Local wound care provided.  With antibiotics, WBC count normalized, lactic acid trended down to normal. -11/18, MRI sacrum was obtained which showed possible osteomyelitis of the distal sacrum just above the coccyx.  Sacral fracture could have the same appearance. -Discussed with orthopedic PA as well as general surgery.  No need of debridement at this time.  ID consult was obtained.  Recommended 6 weeks course of IV Rocephin and IV vancomycin. -PICC line placed on 11/20. -No growth in blood cultures so far.   -Pain management with as needed morphine, Tylenol.   Multiple sclerosis -stable -Patient bedbound at baseline with no  function of bilateral lower extremities. -Continue home gabapentin, baclofen, Robaxin -PT evaluation obtained.  SNF recommended.  Patient would prefer home with home health.  Tachycardia/hypertension -Both blood pressure and heart rate have now improved with Coreg 3.125 mg twice daily.    Hypokalemia -Improved with replacement.  Morbid obesity - Body mass index is 45.73 kg/m. Patient has been advised to make an attempt to improve diet.  She is bedbound and unable to perform any weight loss exercises.   Mobility: PT eval obtained.  Recommended SNF. DVT prophylaxis:  Lovenox subcu Code Status:   Code Status: Full Code  Family Communication: none at bedside Expected Discharge:  PT recommended SNF.  Patient prefers to go home.  Long-term IV antibiotics recommended by infectious disease.  Discussed with Child psychotherapist and pharmacist.  Antibiotic dose and time being adjusted.  Plan to discharge home tomorrow after morning dose of antibiotics.   Consultants:  Surgery, orthopedics over the phone, infectious disease  Procedures:    Antimicrobials: Anti-infectives (From admission, onward)   Start     Dose/Rate Route Frequency Ordered Stop   01/28/19 1345  cefTRIAXone (ROCEPHIN) 2 g in sodium chloride 0.9 % 100 mL IVPB     2 g 200 mL/hr over 30 Minutes Intravenous Every 24 hours 01/28/19 1333     01/27/19 0330  vancomycin (VANCOCIN) 1,750 mg in sodium chloride 0.9 % 500 mL IVPB     1,750 mg 250 mL/hr over 120 Minutes Intravenous Every 24 hours 01/26/19 0945     01/26/19 0300  piperacillin-tazobactam (ZOSYN) IVPB 3.375 g  Status:  Discontinued     3.375 g 12.5 mL/hr over 240 Minutes Intravenous Every 8 hours 01/25/19 2126 01/28/19 1332   01/26/19 0100  piperacillin-tazobactam (ZOSYN) IVPB 3.375 g     3.375 g 100 mL/hr over 30 Minutes Intravenous  Once 01/26/19 0053 01/26/19 0503   01/26/19 0100  vancomycin (VANCOCIN) IVPB 1000 mg/200 mL premix     1,000 mg 200 mL/hr over 60 Minutes  Intravenous  Once 01/26/19 0053 01/26/19 0503   01/25/19 2130  vancomycin (VANCOCIN) 1,250 mg in sodium chloride 0.9 % 250 mL IVPB  Status:  Discontinued     1,250 mg 166.7 mL/hr over 90 Minutes Intravenous Every 12 hours 01/25/19 2126 01/26/19 0945   01/25/19 2030  vancomycin (VANCOCIN) IVPB 1000 mg/200 mL premix  Status:  Discontinued     1,000 mg 200 mL/hr over 60 Minutes Intravenous  Once 01/25/19 2022 01/25/19 2126   01/25/19 2030  piperacillin-tazobactam (ZOSYN) IVPB 3.375 g     3.375 g 100 mL/hr over 30 Minutes Intravenous  Once 01/25/19 2022 01/25/19 2136      Diet Order            Diet regular Room service appropriate? Yes; Fluid consistency: Thin  Diet effective now              Infusions:  . cefTRIAXone (ROCEPHIN)  IV 2 g (01/29/19 1329)  . vancomycin 1,750 mg (01/30/19 0308)    Scheduled Meds: . baclofen  10 mg Oral TID  . carvedilol  3.125 mg Oral BID WC  . Chlorhexidine Gluconate Cloth  6 each Topical Daily  . enoxaparin (LOVENOX) injection  55 mg Subcutaneous Q24H  . feeding supplement (ENSURE ENLIVE)  237 mL Oral BID BM  . gabapentin  300 mg Oral TID  . Gerhardt's butt cream   Topical TID  . methocarbamol  500 mg Oral TID  . potassium chloride  10 mEq Oral Daily  . sodium chloride flush  10-40 mL Intracatheter Q12H    PRN meds: acetaminophen, bisacodyl, gabapentin, hydrALAZINE, morphine injection, polyethylene glycol, sodium chloride flush   Objective: Vitals:   01/30/19 0254 01/30/19 0853  BP: 136/77 (!) 150/86  Pulse: 99 97  Resp: 18 18  Temp: 98.2 F (36.8 C) 98 F (36.7 C)  SpO2: 97% 96%    Intake/Output Summary (Last 24 hours) at 01/30/2019 1052 Last data filed at 01/30/2019 0939 Gross per 24 hour  Intake 580 ml  Output 850 ml  Net -270 ml   Filed Weights   01/25/19 2100  Weight: 113.4 kg   Weight change:  Body mass index is 45.73 kg/m.   Physical Exam:  General exam: Appears calm and comfortable.  Skin: No rashes, lesions  or ulcers. HEENT: Atraumatic, normocephalic, supple neck, no obvious bleeding Lungs: Clear to auscultation bilaterally.  Not on oxygen supplementation. CVS: Regular rate and rhythm, no murmur GI/Abd soft, nontender, nondistended, bowel sound present CNS: Alert, awake, oriented x3 Psychiatry: Depressed mood Extremities: No pedal edema, no calf tenderness  Data Review: I have personally reviewed the laboratory data and studies available.  Recent Labs  Lab 01/25/19 1650 01/26/19 0358 01/27/19 0409  WBC 11.3* 9.2 8.9  NEUTROABS 7.2  --  4.2  HGB 13.8 12.5 11.6*  HCT 43.6 40.6 36.0  MCV 99.5 102.3* 98.1  PLT 291 265 230   Recent Labs  Lab 01/26/19 0358 01/27/19 0409 01/28/19 0447 01/29/19 0232 01/30/19 0620  NA 140 140 140 140 140  K 3.0* 3.1* 3.3* 4.2  4.3  CL 105 103 103 103 103  CO2 23 27 27 27 27   GLUCOSE 127* 98 96 90 98  BUN 10 8 6 9 12   CREATININE 0.62 0.57 0.52 0.80 0.89  CALCIUM 8.3* 8.7* 8.4* 8.8* 8.8*  MG  --  1.7  --   --   --     Terrilee Croak, MD  [facility] 01/30/2019

## 2019-01-30 NOTE — Progress Notes (Signed)
PHARMACY CONSULT NOTE FOR:  OUTPATIENT  PARENTERAL ANTIBIOTIC THERAPY (OPAT)  Indication: Sacral osteomyelitis Regimen: Vancomycin 1,000mg  IV Q24H and Ceftriaxone 2g IV Q24H End date: 03/11/2019  IV antibiotic discharge orders are pended. To discharging provider:  please sign these orders via discharge navigator,  Select New Orders & click on the button choice - Manage This Unsigned Work.     Thank you for allowing pharmacy to be a part of this patient's care.  Kennon Holter, PharmD PGY1 Ambulatory Care Pharmacy Resident [employer] Phone: [phone] 01/30/2019, 11:08 AM

## 2019-01-31 LAB — BASIC METABOLIC PANEL
Anion gap: 8 (ref 5–15)
BUN: 13 mg/dL (ref 6–20)
CO2: 28 mmol/L (ref 22–32)
Calcium: 8.7 mg/dL — ABNORMAL LOW (ref 8.9–10.3)
Chloride: 100 mmol/L (ref 98–111)
Creatinine, Ser: 0.9 mg/dL (ref 0.44–1.00)
GFR calc Af Amer: >60 mL/min (ref >60)
GFR calc non Af Amer: >60 mL/min (ref >60)
Glucose, Bld: 97 mg/dL (ref 70–99)
Potassium: 4.2 mmol/L (ref 3.5–5.1)
Sodium: 136 mmol/L (ref 135–145)

## 2019-01-31 NOTE — Care Management (Addendum)
08:30 Notified [facility] and [facility] of DC today requiring start of care for [facility] w training of IV Abx tomorrow, and delivery of IV Abx to the home today in preporation. Plan will be for patient to have today's dose prior to leaving, patient will need PTAR with a 3 man crew for discharge to safely get into house.   08:45Notified by [facility] that they are now not willing to accept patient back into service.  Referral placed to [facility], who is going to review case prior to accepting, waiting to hear back.  Notified parents who were very upset that her known, established caregivers are now not going to be caring for her any longer. Patient's mom verbalized understanding and ability to perform TID dressing changes. They confirmed that bed has been delivered to the house.   9:05 [name] able to accept with a start of care Tuesday. Patient will need to stay and get Monday's dose prior to DC. Claria Dice, MD, RN, and family notified.

## 2019-01-31 NOTE — Progress Notes (Signed)
PROGRESS NOTE  [name]  DOB: 09/11/1970  PCP: Marletta Lor, NP FGH:[id number]  DOA: 01/25/2019  LOS: 6 days   Chief Complaint  Patient presents with  . Wound Infection   Brief narrative: [name] is a 48 y.o. female with PMH of Multiple Sclerosis with a known sacral decubitus ulcer followed by home health 3x per week.  Patient was sent to the ED on 11/16 by her home health RN was concerned because of the degree of pain, bleeding and foul smell from the wound. At baseline, patient has paraparesis, is bedbound, has bladder incontinence. She uses air mattress at home.   In the ED, labs showed WBC 11k, lactate 3.3.   Blood cultures obtained.  Started on broad-spectrum antibiotics with Vanco and Zosyn. Patient was admitted to hospitalist medicine service.  Subjective: Patient was seen and examined this afternoon.  Lying down in bed.  Not in distress. Complains of burning pain on the PICC line site today.  Also noticed some twitching of her right extremity. She is worried that she is having a flare of multiple sclerosis.  Assessment/Plan: Stage IV decubitus ulcer, present on admission Cellulitis associated with above Possible sacral osteomyelitis -Known stage IV sacral ulcer present since at least May 2020, followed by home health.   -On admission, WBC 11.3, lactic 3.3, tachycardic. Not overtly septic.   -Surgical consultation was obtained.  No debridement was recommended.   -Local wound care provided.  With antibiotics, WBC count normalized, lactic acid trended down to normal. -11/18, MRI sacrum was obtained which showed possible osteomyelitis of the distal sacrum just above the coccyx.  Sacral fracture could have the same appearance. -Discussed with orthopedic PA as well as general surgery. No need of debridement at this time. ID consult was obtained. Recommended 6 weeks course of IV Rocephin and IV vancomycin. -PICC line placed on 11/20. -No growth in blood cultures so  far.   -Pain management with as needed morphine, Tylenol.  -Ongoing assessment of antibiotic doses and timing by pharmacy.    Multiple sclerosis -stable -Patient bedbound at baseline with no function of bilateral lower extremities. -Continue home gabapentin, baclofen, Robaxin -PT evaluation was obtained. SNF was recommended.  Patient would prefer home with home health.  Burning at the PICC line site/right upper extremity twitching -Started from this morning.  Patient is concerned that if this is a flareup of her multiple sclerosis.  Will monitor symptoms for now. -PICC line insertion site looks clean without any evidence of inflammation or discharge.  Tachycardia/hypertension -Both blood pressure and heart rate have now improved with Coreg 3.125 mg twice daily.    Hypokalemia -Improved with replacement.  Morbid obesity - Body mass index is 45.73 kg/m. Patient has been advised to make an attempt to improve diet.  She is bedbound and unable to perform any weight loss exercises.   Mobility: PT eval obtained.  Recommended SNF. DVT prophylaxis:  Lovenox subcu Code Status:   Code Status: Full Code  Family Communication: none at bedside Expected Discharge:  PT recommended SNF.  Patient prefers to go home.  Long-term IV antibiotics recommended by infectious disease.   She was planned for discharge to home today with home health.  However, per case management, home and company dropped her and now she needs another home with company.  Anticipate discharge to home tomorrow.  Consultants:  Surgery, orthopedics over the phone, infectious disease  Procedures:    Antimicrobials: Anti-infectives (From admission, onward)   Start  Dose/Rate Route Frequency Ordered Stop   02/01/19 1100  vancomycin (VANCOCIN) IVPB 1000 mg/200 mL premix     1,000 mg 200 mL/hr over 60 Minutes Intravenous Every 24 hours 01/30/19 1115     01/30/19 0000  cefTRIAXone (ROCEPHIN) IVPB     2 g Intravenous Every 24  hours 01/30/19 1611 03/13/19 2359   01/30/19 0000  vancomycin IVPB     1,000 mg Intravenous Every 24 hours 01/30/19 1611 03/13/19 2359   01/28/19 1345  cefTRIAXone (ROCEPHIN) 2 g in sodium chloride 0.9 % 100 mL IVPB     2 g 200 mL/hr over 30 Minutes Intravenous Every 24 hours 01/28/19 1333     01/27/19 0330  vancomycin (VANCOCIN) 1,750 mg in sodium chloride 0.9 % 500 mL IVPB  Status:  Discontinued     1,750 mg 250 mL/hr over 120 Minutes Intravenous Every 24 hours 01/26/19 0945 01/30/19 1115   01/26/19 0300  piperacillin-tazobactam (ZOSYN) IVPB 3.375 g  Status:  Discontinued     3.375 g 12.5 mL/hr over 240 Minutes Intravenous Every 8 hours 01/25/19 2126 01/28/19 1332   01/26/19 0100  piperacillin-tazobactam (ZOSYN) IVPB 3.375 g     3.375 g 100 mL/hr over 30 Minutes Intravenous  Once 01/26/19 0053 01/26/19 0503   01/26/19 0100  vancomycin (VANCOCIN) IVPB 1000 mg/200 mL premix     1,000 mg 200 mL/hr over 60 Minutes Intravenous  Once 01/26/19 0053 01/26/19 0503   01/25/19 2130  vancomycin (VANCOCIN) 1,250 mg in sodium chloride 0.9 % 250 mL IVPB  Status:  Discontinued     1,250 mg 166.7 mL/hr over 90 Minutes Intravenous Every 12 hours 01/25/19 2126 01/26/19 0945   01/25/19 2030  vancomycin (VANCOCIN) IVPB 1000 mg/200 mL premix  Status:  Discontinued     1,000 mg 200 mL/hr over 60 Minutes Intravenous  Once 01/25/19 2022 01/25/19 2126   01/25/19 2030  piperacillin-tazobactam (ZOSYN) IVPB 3.375 g     3.375 g 100 mL/hr over 30 Minutes Intravenous  Once 01/25/19 2022 01/25/19 2136      Diet Order            Diet regular Room service appropriate? Yes; Fluid consistency: Thin  Diet effective now              Infusions:  . cefTRIAXone (ROCEPHIN)  IV 2 g (01/31/19 1129)  . [START ON 02/01/2019] vancomycin      Scheduled Meds: . baclofen  10 mg Oral TID  . carvedilol  3.125 mg Oral BID WC  . Chlorhexidine Gluconate Cloth  6 each Topical Daily  . enoxaparin (LOVENOX) injection  55 mg  Subcutaneous Q24H  . feeding supplement (ENSURE ENLIVE)  237 mL Oral BID BM  . gabapentin  300 mg Oral TID  . Gerhardt's butt cream   Topical TID  . methocarbamol  500 mg Oral TID  . potassium chloride  10 mEq Oral Daily  . sodium chloride flush  10-40 mL Intracatheter Q12H    PRN meds: acetaminophen, bisacodyl, gabapentin, hydrALAZINE, morphine injection, polyethylene glycol, sodium chloride flush   Objective: Vitals:   01/31/19 0906 01/31/19 1208  BP: (!) 151/87 131/73  Pulse: (!) 107 96  Resp: 18 18  Temp: 99.4 F (37.4 C) 99 F (37.2 C)  SpO2: 97% 94%    Intake/Output Summary (Last 24 hours) at 01/31/2019 1417 Last data filed at 01/31/2019 1129 Gross per 24 hour  Intake 620.06 ml  Output 2300 ml  Net -1679.94 ml   Danley Danker  Weights   01/25/19 2100  Weight: 113.4 kg   Weight change:  Body mass index is 45.73 kg/m.   Physical Exam:  General exam: Patient is stressed because of her right upper extremity twitching and burning. Skin: No rashes, lesions or ulcers. HEENT: Atraumatic, normocephalic, supple neck, no obvious bleeding Lungs: Clear to auscultation bilaterally.  Not on oxygen supplementation. CVS: Regular rate and rhythm, no murmur GI/Abd soft, nontender, nondistended, bowel sound present CNS: Alert, awake, oriented x3 Psychiatry: Depressed mood Extremities: No pedal edema, no calf tenderness.  Right arm PICC line site is intact.  Data Review: I have personally reviewed the laboratory data and studies available.  Recent Labs  Lab 01/25/19 1650 01/26/19 0358 01/27/19 0409  WBC 11.3* 9.2 8.9  NEUTROABS 7.2  --  4.2  HGB 13.8 12.5 11.6*  HCT 43.6 40.6 36.0  MCV 99.5 102.3* 98.1  PLT 291 265 230   Recent Labs  Lab 01/27/19 0409 01/28/19 0447 01/29/19 0232 01/30/19 0620 01/31/19 0433  NA 140 140 140 140 136  K 3.1* 3.3* 4.2 4.3 4.2  CL 103 103 103 103 100  CO2 27 27 27 27 28   GLUCOSE 98 96 90 98 97  BUN 8 6 9 12 13   CREATININE 0.57 0.52 0.80  0.89 0.90  CALCIUM 8.7* 8.4* 8.8* 8.8* 8.7*  MG 1.7  --   --   --   --     Lorin Glass, MD  [facility] 01/31/2019

## 2019-01-31 NOTE — Plan of Care (Signed)
Patient consumed 75% of breakfast meal and 100% of ensure as ordered.

## 2019-02-01 ENCOUNTER — Telehealth: Payer: Self-pay | Admitting: *Deleted

## 2019-02-01 LAB — BASIC METABOLIC PANEL
Anion gap: 7 (ref 5–15)
BUN: 12 mg/dL (ref 6–20)
CO2: 32 mmol/L (ref 22–32)
Calcium: 8.8 mg/dL — ABNORMAL LOW (ref 8.9–10.3)
Chloride: 102 mmol/L (ref 98–111)
Creatinine, Ser: 0.88 mg/dL (ref 0.44–1.00)
GFR calc Af Amer: >60 mL/min (ref >60)
GFR calc non Af Amer: >60 mL/min (ref >60)
Glucose, Bld: 98 mg/dL (ref 70–99)
Potassium: 3.7 mmol/L (ref 3.5–5.1)
Sodium: 141 mmol/L (ref 135–145)

## 2019-02-01 MED ORDER — COLLAGENASE 250 UNIT/GM EX OINT
TOPICAL_OINTMENT | Freq: Every day | CUTANEOUS | Status: DC
  Administered 2019-02-02: 09:00:00 via TOPICAL
  Filled 2019-02-01: qty 30

## 2019-02-01 MED ORDER — DAKINS (1/4 STRENGTH) 0.125 % EX SOLN
Freq: Every day | CUTANEOUS | Status: DC
  Administered 2019-02-02: 09:00:00
  Filled 2019-02-01: qty 473

## 2019-02-01 MED ORDER — CARVEDILOL 3.125 MG PO TABS: 3.1250 mg | ORAL_TABLET | Freq: Two times a day (BID) | ORAL | 0 refills | Status: DC

## 2019-02-01 MED ORDER — ENSURE ENLIVE PO LIQD: 237.0000 mL | Freq: Two times a day (BID) | ORAL | 12 refills | Status: DC

## 2019-02-01 NOTE — Progress Notes (Signed)
CM spoke to pts mother: [name] and she stated this am that the air mattress delivered over the weekend did not fit the bed. CM reached out to [employer] and they sent a representative to the home. Per [name] this afternoon, [employer] is going to switch out the mattress for a different size. This wont happen until late tonight or early am. MD, patient and [facility] agency updated.  Plan is for d/c home tomorrow after mattress switched and IV abx administered.

## 2019-02-01 NOTE — Plan of Care (Signed)

## 2019-02-01 NOTE — Progress Notes (Signed)
Physical Therapy Wound Treatment Patient Details  Name: [name] MRN: [id number] Date of Birth: 28-Jun-1970  Today's Date: 02/01/2019 Time: 4097-3532 Time Calculation (min): 42 min  Subjective  Subjective: Pt remains agreeable to hydro therapy with decreased pain iin LEs and hand when turning.   Patient and Family Stated Goals: Heal wound, heal groin injury Date of Onset: (Unsure)  Pain Score:    Wound Assessment  Wound / Incision (Open or Dehisced) 01/26/19 Other (Comment) Buttocks Left Stg 4 (Active)  Dressing Type ABD;Barrier Film (skin prep);Gauze (Comment);Moist to dry 02/01/19 1354  Dressing Changed Changed 02/01/19 1354  Dressing Status Clean;Dry;Intact 02/01/19 1354  Dressing Change Frequency Daily 02/01/19 1354  Site / Wound Assessment Bleeding;Red;Yellow;Black 02/01/19 1354  % Wound base Red or Granulating 85% 02/01/19 1354  % Wound base Yellow/Fibrinous Exudate 5% 02/01/19 1354  % Wound base Black/Eschar 10% 02/01/19 1354  % Wound base Other/Granulation Tissue (Comment) 0% 02/01/19 1354  Peri-wound Assessment Intact;Erythema (blanchable) 02/01/19 1354  Wound Length (cm) 5 cm 01/27/19 1100  Wound Width (cm) 2.7 cm 01/27/19 1100  Wound Depth (cm) 4.5 cm 01/27/19 1100  Wound Volume (cm^3) 60.75 cm^3 01/27/19 1100  Wound Surface Area (cm^2) 13.5 cm^2 01/27/19 1100  Margins Unattached edges (unapproximated) 02/01/19 1354  Closure None 02/01/19 1354  Drainage Amount Moderate 02/01/19 1354  Drainage Description Sanguineous 02/01/19 1354  Treatment Cleansed;Debridement (Selective);Hydrotherapy (Pulse lavage) 02/01/19 1354     Wound / Incision (Open or Dehisced) 01/26/19 Buttocks Right Skin tear/stg 2 (Active)  Dressing Type Foam - Lift dressing to assess site every shift 02/01/19 0800  Dressing Changed Changed 02/01/19 0800  Dressing Status Clean;Dry 02/01/19 0800  Dressing Change Frequency PRN 02/01/19 0800  Site / Wound Assessment Clean;Dry 02/01/19 0800  Wound  Length (cm) 2 cm 01/26/19 2000  Wound Width (cm) 1 cm 01/26/19 2000  Wound Depth (cm) 0 cm 01/26/19 0745  Wound Volume (cm^3) 0 cm^3 01/26/19 0745  Wound Surface Area (cm^2) 2 cm^2 01/26/19 2000  Drainage Amount None 01/29/19 0900  Treatment Cleansed 01/31/19 2100     Wound / Incision (Open or Dehisced) 01/26/19 Buttocks Right skin tear/ Stg 2 (Active)  Dressing Changed Changed 01/29/19 0900  Dressing Status Clean;Dry 01/29/19 0900  Dressing Change Frequency PRN 01/29/19 0900  Site / Wound Assessment Clean;Dry 01/29/19 0900  Wound Length (cm) 1 cm 01/26/19 2000  Wound Width (cm) 2 cm 01/26/19 2000  Wound Depth (cm) 0 cm 01/26/19 2000  Wound Volume (cm^3) 0 cm^3 01/26/19 2000  Wound Surface Area (cm^2) 2 cm^2 01/26/19 2000  Drainage Amount Minimal 01/26/19 0746  Drainage Description Sanguineous 01/26/19 0746  Treatment Cleansed 01/26/19 2000     Incision 03/30/13 Leg Left (Active)   Hydrotherapy Pulsed lavage therapy - wound location: L buttock Pulsed Lavage with Suction (psi): 12 psi Pulsed Lavage with Suction - Normal Saline Used: 1000 mL Pulsed Lavage Tip: Tip with splash shield Selective Debridement Selective Debridement - Location: L buttock Selective Debridement - Tools Used: Forceps;Scalpel(limited debridement as tissue remains adherent.  ) Selective Debridement - Tissue Removed: yellow nad black necrotic tissue   Wound Assessment and Plan  Wound Therapy - Assess/Plan/Recommendations Wound Therapy - Clinical Statement: Pt in decreased pain with position and required decreased assistance to roll.  The tissue uin the inferior border remains adherent and would benefit from santyl.   Pt also presents with green tinge toi packing.  Informed WOC nurse of findings and will await new orders for dressing changes.   Wound  Therapy - Functional Problem List: Decreased tolerance for position changes Factors Delaying/Impairing Wound Healing: Altered sensation;Incontinence;Multiple  medical problems Hydrotherapy Plan: Debridement;Dressing change;Patient/family education;Pulsatile lavage with suction Wound Therapy - Frequency: 3X / week(MWF) Wound Therapy - Follow Up Recommendations: Skilled nursing facility(vs home health RN depending on d/c plan) Wound Plan: See above  Wound Therapy Goals- Improve the function of patient's integumentary system by progressing the wound(s) through the phases of wound healing (inflammation - proliferation - remodeling) by: Decrease Necrotic Tissue to: 0 Decrease Necrotic Tissue - Progress: Progressing toward goal Increase Granulation Tissue to: 100 Increase Granulation Tissue - Progress: Progressing toward goal Goals/treatment plan/discharge plan were made with and agreed upon by patient/family: Yes Time For Goal Achievement: 7 days Wound Therapy - Potential for Goals: Fair  Goals will be updated until maximal potential achieved or discharge criteria met.  Discharge criteria: when goals achieved, discharge from hospital, MD decision/surgical intervention, no progress towards goals, refusal/missing three consecutive treatments without notification or medical reason.  GP     [place] Current Eli Hose 02/01/2019, 2:02 PM Erasmo Leventhal , PTA Acute Rehabilitation Services Pager [phone] Office [phone]

## 2019-02-01 NOTE — Telephone Encounter (Signed)
Astrid Divine S  Kuppelweiser, Cassie L, RPH-CPP; Landis Gandy, RN  Phone Number: [phone]        Jossie Ng and Sharyn Lull. I just wanted you to know Ms. Blakeley is an active [place] patient with [facility]. Unfortunately this [facility] agency works with [facility] for infusion. Just making sure you are aware.   Pam~

## 2019-02-01 NOTE — Consult Note (Signed)
[facility] Nurse Consult Note: Reason for Consult: PTA contacted [facility] for update of the hydro and wound care orders. CCS has signed off.  Wound type: Stage 4 Pressure Injury Pressure Injury POA: Yes Measurement: see PT notes Wound bed: mostly clean, however PT noted slough present now at the proximal aspect of the wound bed. Tunneling present with new onset of blue-green drainage.  Drainage (amount, consistency, odor) see above Periwound: intact  Dressing procedure/placement/frequency: Update wound care orders.   [place] nurse to follow along with PT for updating wound care orders.   [place], [name], [place]

## 2019-02-01 NOTE — Discharge Summary (Signed)
Physician Discharge Summary  [name] CWC:[id number] DOB: [birth year] DOA: 01/25/2019  PCP: Alvester Chou, NP  Admit date: 01/25/2019 Discharge date: 02/01/2019  Admitted From: Home Discharge disposition: Home health PT   Code Status: Full Code  Diet Recommendation: Cardiac diet   Recommendations for Outpatient Follow-Up:   1. Follow-up with patient as an outpatient  Discharge Diagnosis:   Principal Problem:   Sacral osteomyelitis ([place]) Active Problems:   Multiple sclerosis ([place])   Essential hypertension, benign   Stage 4 decubitus ulcer ([facility])   Cellulitis   History of Present Illness / Brief narrative:  [name] is a 48 y.o. female with PMH of Multiple Sclerosis with a known sacral decubitus ulcer followed by home health 3x per week.  Patient was sent to the ED on 11/16 by her home health RN was concerned because of the degree of pain, bleeding and foul smell from the wound. At baseline, patient has paraparesis, is bedbound, has bladder incontinence. She uses air mattress at home. In the ED, labs showed WBC 11k, lactate 3.3.  Blood cultures obtained. Started on broad-spectrum antibiotics with Vanco and Zosyn. Patient was admitted to hospitalist medicine service.  Hospital Course:  Stage IV decubitus ulcer, present on admission Cellulitis associated with above Possible sacral osteomyelitis -Known stage IV sacral ulcer present since at least May 2020, followed by home health.  -On admission, WBC 11.3, lactic 3.3, tachycardic. Not overtly septic.   -Surgical consultation was obtained.  No debridement was recommended.   -Local wound care provided.  With antibiotics, WBC count normalized, lactic acid trended down to normal. -11/18, MRI sacrum was obtained which showed possible osteomyelitis of the distal sacrum just above the coccyx.  Sacral fracture could have the same appearance. -Discussed with orthopedic PA as well as general surgery. No need of  debridement at this time. ID consult was obtained. Recommended 6 weeks course of IV Rocephin and IV vancomycin, planned to continue till Dec 31. -PICC line placed on 11/20. -No growth in blood cultures so far.   -Pain management with as needed morphine, Tylenol. -Ongoing assessment of antibiotic doses and timing by pharmacy.    Multiple sclerosis-stable -Patient bedbound at baseline with no function of bilateral lower extremities. -Continue home gabapentin, baclofen, Robaxin -PT evaluation was obtained. SNF was recommended.   -Patient would prefer home with home health.  Burning at the PICC line site/right upper extremity twitching -Patient failed so yesterday morning.  Patient was concerned that if this is a flareup of her multiple sclerosis.  She does not have any similar symptoms today. -PICC line insertion site looks clean without any evidence of inflammation or discharge.  Tachycardia/hypertension -Both blood pressure and heart rate have now improved with Coreg 3.125 mg twice daily.   Hypokalemia -Improved with replacement.  Morbid obesity - Body mass index is 45.73 kg/m. Patient has been advised to make an attempt to improve diet.  She is bedbound and unable to perform any weight loss exercises.  Stable discharge today with home health.  Subjective:  Seen and examined this morning.  Pleasant middle-aged Caucasian female.  Not in distress.  No burning or twitching of right upper extremity today.  Discharge Exam:   Vitals:   01/31/19 2336 02/01/19 0340 02/01/19 0809 02/01/19 1223  BP: 140/78 136/74 (!) 151/85 119/65  Pulse: 97 89 92 96  Resp: _0 Temp: 98.1 F (36.7 C) 97.8 F (36.6 C) 98.3 F (36.8 C) 97.7 F (36.5 C)  TempSrc: Oral Oral Oral Oral  SpO2: 100% 100% 100% 100%  Weight:      Height:        Body mass index is 45.73 kg/m.  General exam: Appears calm and comfortable.  Skin: No rashes, lesions or ulcers. HEENT: Atraumatic, normocephalic,  supple neck, no obvious bleeding Lungs: Clear to auscultation bilaterally CVS: Regular rate and rhythm, no murmur GI/Abd soft, nontender, nondistended, bowel sound present CNS: Alert, awake, oriented x3 Psychiatry: Depressed look Extremities: No pedal edema, no calf tenderness  Discharge Instructions:  Wound care: wound care-sacral wound by home nurse Discharge Instructions    Home infusion instructions [facility] May follow [place] Dosing Protocol; May administer Cathflo as needed to maintain patency of vascular access device.; [place]ushing of vascular access device: per [facility] Protocol: 0.9% NaCl pre/post medica...   Complete by: As directed    Instructions: May follow [place] Dosing Protocol   Instructions: May administer Cathflo as needed to maintain patency of vascular access device.   Instructions: [place]ushing of vascular access device: per [facility] Protocol: 0.9% NaCl pre/post medication administration and prn patency; Heparin 100 u/ml, 72m for implanted ports and Heparin 10u/ml, 539mfor all other central venous catheters.   Instructions: May follow [facility] Anaphylaxis Protocol for First Dose Administration in the home: 0.9% NaCl at 25-50 ml/hr to maintain IV access for protocol meds. Epinephrine 0.3 ml IV/IM PRN and Benadryl 25-50 IV/IM PRN s/s of anaphylaxis.   Instructions: [facility] Coordinator (RN) to assist per patient IV care needs in the home PRN.   Increase activity slowly   Complete by: As directed    Mattress Replacement   Complete by: As directed    Wheelchair   Complete by: As directed    Lightweight manual wheelchair with both cushions     Follow-up Information    [facility]   [facility]: [facility]: For home care services to start Tuesday  Contact information: [address] [place] [phone]        Allergies as of 02/01/2019      Reactions   Tizanidine Diarrhea    Tomato       Medication List    STOP taking these medications   LORazepam 0.5 MG tablet Commonly known as: ATIVAN     TAKE these medications   acetaminophen 500 MG tablet Commonly known as: TYLENOL Take 1,000 mg by mouth every 6 (six) hours.   Adult Gummy Chew Chew 1 tablet by mouth 2 (two) times daily.   alum & mag hydroxide-simeth 200-200-20 MG/5ML suspension Commonly known as: MAALOX/MYLANTA Take 15 mLs by mouth every 6 (six) hours as needed for indigestion or heartburn.   baclofen 10 MG tablet Commonly known as: LIORESAL Take 10 mg by mouth 3 (three) times daily.   BIOFREEZE EX Apply 1 application topically 4 (four) times daily as needed (knee pain).   carvedilol 3.125 MG tablet Commonly known as: COREG Take 1 tablet (3.125 mg total) by mouth 2 (two) times daily with a meal.   cefTRIAXone  IVPB Commonly known as: ROCEPHIN Inject 2 g into the vein daily. Indication: Sacral osteomyelitis  Last Day of Therapy: 03/11/2019 Labs - Once weekly: CBC/D and BMP, Labs - Every other week: ESR and CRP   feeding supplement (ENSURE ENLIVE) Liqd Take 237 mLs by mouth 2 (two) times daily between meals.   gabapentin 300 MG capsule Commonly known as: NEURONTIN Take  300 mg by mouth See admin instructions. Take one capsule (300 mg) by mouth four times daily, may take an extra capsule (300 mg) at night as needed for pain   ibuprofen 200 MG tablet Commonly known as: ADVIL Take 600 mg by mouth every 6 (six) hours as needed (knee pain).   meloxicam 7.5 MG tablet Commonly known as: MOBIC Take 7.5 mg by mouth 2 (two) times daily.   methocarbamol 500 MG tablet Commonly known as: ROBAXIN Take 500 mg by mouth 3 (three) times daily. For spasms   vancomycin  IVPB Inject 1,000 mg into the vein daily. Indication: Sacral osteomyelitis  Last Day of Therapy:  03/11/2019 Labs - _0 /21/20 1611           Durable Medical Equipment  (From admission, onward)         Start     Ordered   01/29/19 1058  For home use only DME Specialty mattress  Once    Comments: LALM mattress needed for stage 4 ulcer on sacrum   01/29/19 1101   01/29/19 1056  For home use only DME wheelchair cushion (seat and back)  Once    Comments: Need to be Roho cushions for the wheelchair d/t stage 4 ulcer on sacrum   01/29/19 1056   01/29/19 0831  For home use only DME lightweight manual wheelchair with seat cushion  Once    Comments: Patient suffers from multiple sclerosis which impairs their ability to perform daily activities like ambulating, bathing in the home.  A walker will not resolve  issue with performing activities of daily living. A wheelchair  will allow patient to safely perform daily activities. Patient is not able to propel themselves in the home using a standard weight wheelchair due to weakness. Patient can self propel in the lightweight wheelchair. Length of need lifetime. Accessories: elevating leg rests (ELRs), wheel locks, extensions and anti-tippers.   01/29/19 3559          Time coordinating discharge: 35 minutes  The results of significant diagnostics from this hospitalization (including imaging, microbiology, ancillary and laboratory) are listed below for reference.    Procedures and Diagnostic Studies:  No results found.   Labs:   Basic Metabolic Panel: Recent Labs  Lab 01/27/19 0409 01/28/19 0447 01/29/19 0232 01/30/19 0620 01/31/19 0433 02/01/19 0553  NA 140 140 140 140 136 141  K 3.1* 3.3* 4.2 4.3 4.2 3.7  CL 103 103 103 103 100 102  CO2 _0 32  GLUCOSE 98 96 90 98 97 98  BUN _1 CREATININE 0.57 0.52 0.80 0.89 0.90 0.88  CALCIUM 8.7* 8.4* 8.8* 8.8* 8.7* 8.8*  MG 1.7  --   --   --   --   --    GFR Estimated Creatinine Clearance: 91 mL/min (by C-G formula based on SCr of 0.88 mg/dL). Liver Function Tests: Recent Labs  Lab 01/25/19 1650  AST 37  ALT 15  ALKPHOS 112  BILITOT 0.5  PROT 7.3  ALBUMIN 2.4*   No results for input(s): LIPASE, AMYLASE in the last 168 hours. No results for input(s): AMMONIA in the last 168 hours. Coagulation profile No results for input(s): INR, PROTIME in the last 168 hours.  CBC: Recent Labs  Lab 01/25/19 1650 01/26/19 0358 01/27/19 0409  WBC 11.3* 9.2 8.9  NEUTROABS 7.2  --  4.2  HGB 13.8 12.5 11.6*  HCT 43.6 40.6 36.0  MCV 99.5 102.3* 98.1  PLT 291 265 230   Cardiac Enzymes: No results for input(s): CKTOTAL, CKMB, CKMBINDEX, TROPONINI in the last 168 hours. BNP: Invalid input(s): POCBNP CBG: No results for input(s): GLUCAP in the last 168 hours. D-Dimer No results for input(s): DDIMER in the last 72 hours. Hgb A1c  No results for input(s): HGBA1C in the last 72 hours. Lipid Profile No results for input(s): CHOL, HDL, LDLCALC, TRIG, CHOLHDL, LDLDIRECT in the last 72 hours. Thyroid function studies No results for input(s): TSH, T4TOTAL, T3FREE, THYROIDAB in the last 72 hours.  Invalid input(s): FREET3 Anemia work up No results for input(s): VITAMINB12, FOLATE, FERRITIN, TIBC, IRON, RETICCTPCT in the last 72 hours. Microbiology Recent Results (from the past 240 hour(s))  Blood culture (routine x 2)     Status: None   Collection Time: 01/25/19  8:40 PM   Specimen: BLOOD  Result Value Ref Range Status   Specimen Description BLOOD RIGHT ANTECUBITAL  Final   Special Requests   Final    BOTTLES DRAWN AEROBIC ONLY Blood Culture results may not be optimal due to an inadequate volume of blood received in culture bottles   Culture   Final    NO GROWTH 5 DAYS Performed at [facility], [place] [address], [place], [place] [address]    Report Status 01/30/2019 FINAL  Final  Blood culture (routine x 2)     Status: None   Collection Time: 01/25/19  8:40 PM   Specimen: BLOOD  Result Value Ref Range Status   Specimen Description BLOOD BLOOD RIGHT FOREARM  Final   Special Requests   Final    BOTTLES DRAWN AEROBIC AND ANAEROBIC Blood Culture results may not be optimal due to an inadequate volume of blood received in culture bottles   Culture   Final    NO GROWTH 5 DAYS Performed at [facility], [place] [address] [place],  [address]    Report Status 01/30/2019 FINAL  Final  SARS CORONAVIRUS 2 (TAT 6-24 HRS) Nasopharyngeal Nasopharyngeal Swab     Status: None   Collection Time: 01/25/19 10:35 PM   Specimen: Nasopharyngeal Swab  Result Value Ref Range Status  SARS Coronavirus 2 NEGATIVE NEGATIVE Final    Comment: (NOTE) SARS-CoV-2 target nucleic acids are NOT DETECTED. The SARS-CoV-2 RNA is generally detectable in upper and lower respiratory specimens during the acute phase of infection.  Negative results do not preclude SARS-CoV-2 infection, do not rule out co-infections with other pathogens, and should not be used as the sole basis for treatment or other patient management decisions. Negative results must be combined with clinical observations, patient history, and epidemiological information. The expected result is Negative. Fact Sheet for Patients: SugarRoll.be Fact Sheet for Healthcare Providers: https://www.woods-mathews.com/ This test is not yet approved or cleared by the [place] FDA and  has been authorized for detection and/or diagnosis of SARS-CoV-2 by FDA under an Emergency Use Authorization (EUA). This EUA will remain  in effect (meaning this test can be used) for the duration of the COVID-19 declaration under Section 56 4(b)(1) of the Act, 21 U.S.C. section 360bbb-3(b)(1), unless the authorization is terminated or revoked sooner. Performed at [facility], [place] [address] [place], [place] [address]     Please note: You were cared for by a hospitalist during your hospital stay. Once you are discharged, your primary care physician will handle any further medical issues. Please note that NO REFILLS for any discharge medications will be authorized once you are discharged, as it is imperative that you return to your primary care physician (or establish a relationship with a primary care physician if you do not have one) for your post hospital discharge needs so that they can reassess your need for medications and monitor your lab values.  Signed: Terrilee Croak  [facility] 02/01/2019, 1:30 PM

## 2019-02-02 DIAGNOSIS — M4628 Osteomyelitis of vertebra, sacral and sacrococcygeal region: Secondary | ICD-10-CM | POA: Diagnosis not present

## 2019-02-02 DIAGNOSIS — L8994 Pressure ulcer of unspecified site, stage 4: Secondary | ICD-10-CM | POA: Diagnosis not present

## 2019-02-02 LAB — BASIC METABOLIC PANEL
Anion gap: 10 (ref 5–15)
BUN: 12 mg/dL (ref 6–20)
CO2: 31 mmol/L (ref 22–32)
Calcium: 8.8 mg/dL — ABNORMAL LOW (ref 8.9–10.3)
Chloride: 100 mmol/L (ref 98–111)
Creatinine, Ser: 1 mg/dL (ref 0.44–1.00)
GFR calc Af Amer: >60 mL/min (ref >60)
GFR calc non Af Amer: >60 mL/min (ref >60)
Glucose, Bld: 96 mg/dL (ref 70–99)
Potassium: 3.9 mmol/L (ref 3.5–5.1)
Sodium: 141 mmol/L (ref 135–145)

## 2019-02-02 MED ORDER — COLLAGENASE 250 UNIT/GM EX OINT: TOPICAL_OINTMENT | Freq: Every day | CUTANEOUS | 0 refills | Status: DC

## 2019-02-02 MED ORDER — DAKINS (1/4 STRENGTH) 0.125 % EX SOLN: Freq: Every day | CUTANEOUS | 0 refills | Status: AC

## 2019-02-02 NOTE — Care Management Important Message (Signed)
Important Message  Patient Details  Name: [name] MRN: [id number] Date of Birth: 06-Aug-1970   Medicare Important Message Given:  Yes     [name] 02/02/2019, 2:45 PM

## 2019-02-02 NOTE — Discharge Summary (Signed)
Physician Discharge Summary  [name]:[id number] DOB: [birth year] DOA: 01/25/2019  PCP: Alvester Chou, NP  Admit date: 01/25/2019 Discharge date: 02/02/2019  Admitted From: Home Discharge disposition: Home health PT   Code Status: Full Code  Diet Recommendation: Cardiac diet   Recommendations for Outpatient Follow-Up:   1. Follow-up with primary care provider as an outpatient  Discharge Diagnosis:   Principal Problem:   Sacral osteomyelitis ([place]) Active Problems:   Multiple sclerosis ([place])   Essential hypertension, benign   Stage 4 decubitus ulcer ([place])   Cellulitis   History of Present Illness / Brief narrative:  [name] is a 48 y.o. female with PMH of Multiple Sclerosis with a known sacral decubitus ulcer followed by home health 3x per week.  Patient was sent to the ED on 11/16 by her home health RN was concerned because of the degree of pain, bleeding and foul smell from the wound. At baseline, patient has paraparesis, is bedbound, has bladder incontinence. She uses air mattress at home. In the ED, labs showed WBC 11k, lactate 3.3.  Blood cultures obtained. Started on broad-spectrum antibiotics with Vanco and Zosyn. Patient was admitted to hospitalist medicine service.  Hospital Course:  Stage IV decubitus ulcer, present on admission Cellulitis associated with above Possible sacral osteomyelitis -Known stage IV sacral ulcer present since at least May 2020, followed by home health.  -On admission, WBC 11.3, lactic 3.3, tachycardic. Not overtly septic.   -Surgical consultation was obtained.  No debridement was recommended.   -Local wound care provided.  With antibiotics, WBC count normalized, lactic acid trended down to normal. -11/18, MRI sacrum was obtained which showed possible osteomyelitis of the distal sacrum just above the coccyx.  Sacral fracture could have the same appearance. -Discussed with orthopedic PA as well as general surgery. No  need of debridement at this time. ID consult was obtained. Recommended 6 weeks course of IV Rocephin and IV vancomycin, planned to continue till Dec 31. -PICC line placed on 11/20. -No growth in blood cultures so far.   -Pain management  Multiple sclerosis-stable -Patient bedbound at baseline with no function of bilateral lower extremities. -Continue home gabapentin, baclofen, Robaxin -PT evaluation was obtained. SNF was recommended.   -Patient would prefer home with home health.  Burning at the PICC line site/right upper extremity twitching -Patient felt such symptoms on 11/22. She was concerned if this wass a flareup of her multiple sclerosis.  Symptoms self resolved the next day. -PICC line insertion site looks clean without any evidence of inflammation or discharge.  Tachycardia/hypertension -Both blood pressure and heart rate have now improved with Coreg 3.125 mg twice daily.   Hypokalemia -Improved with replacement.  Morbid obesity - Body mass index is 45.73 kg/m. Patient has been advised to make an attempt to improve diet.  She is bedbound and unable to perform any weight loss exercises.  Stable discharge today with home health.  Subjective:  Seen and examined this morning.  Pleasant middle-aged Caucasian female.  Not in distress.  No burning or twitching of right upper extremity today.  Discharge Exam:   Vitals:   02/01/19 1948 02/01/19 2328 02/02/19 0355 02/02/19 0852  BP: 107/66 112/65 127/71 136/71  Pulse: 92 97 98 95  Resp: _0 Temp: 99 F (37.2 C) 98.1 F (36.7 C) 98.8 F (37.1 C) 98.5 F (36.9 C)  TempSrc: Oral Oral Oral Oral  SpO2: 98% 97% 100% 96%  Weight:  Height:        Body mass index is 45.73 kg/m.  General exam: Appears calm and comfortable.  Skin: No rashes, lesions or ulcers. HEENT: Atraumatic, normocephalic, supple neck, no obvious bleeding Lungs: Clear to auscultation bilaterally CVS: Regular rate and rhythm, no murmur  GI/Abd soft, nontender, nondistended, bowel sound present CNS: Alert, awake, oriented x3 Psychiatry: Depressed look Extremities: No pedal edema, no calf tenderness  Discharge Instructions:  Wound care: wound care-sacral wound by home nurse Discharge Instructions    Home infusion instructions [facility] May follow [place] Dosing Protocol; May administer Cathflo as needed to maintain patency of vascular access device.; [place] of vascular access device: per [facility] Protocol: 0.9% NaCl pre/post medica...   Complete by: As directed    Instructions: May follow [place] Dosing Protocol   Instructions: May administer Cathflo as needed to maintain patency of vascular access device.   Instructions: [place]ushing of vascular access device: per [facility] Protocol: 0.9% NaCl pre/post medication administration and prn patency; Heparin 100 u/ml, 20m for implanted ports and Heparin 10u/ml, 550mfor all other central venous catheters.   Instructions: May follow [facility] Anaphylaxis Protocol for First Dose Administration in the home: 0.9% NaCl at 25-50 ml/hr to maintain IV access for protocol meds. Epinephrine 0.3 ml IV/IM PRN and Benadryl 25-50 IV/IM PRN s/s of anaphylaxis.   Instructions: [place] Coord[facility] (RN) to assist per patient IV care needs in the home PRN.   Increase activity slowly   Complete by: As directed    Increase activity slowly   Complete by: As directed    Mattress Replacement   Complete by: As directed    Wheelchair   Complete by: As directed    Lightweight manual wheelchair with both cushions     Follow-up Information    [facility].   Specialty: Ho[place]: For home care services to start Tuesday  Contact information: [address] [place] [phone]        BaAlvester ChouNP Follow up.   Specialty: Nurse Practitioner Contact information: [facility] [phone]        Allergies as of 02/02/2019      Reactions   Tizanidine Diarrhea   Tomato       Medication List    STOP taking these medications   LORazepam 0.5 MG tablet Commonly known as: ATIVAN     TAKE these medications   acetaminophen 500 MG tablet Commonly known as: TYLENOL Take 1,000 mg by mouth every 6 (six) hours.   Adult Gummy Chew Chew 1 tablet by mouth 2 (two) times daily.   alum & mag hydroxide-simeth 200-200-20 MG/5ML suspension Commonly known as: MAALOX/MYLANTA Take 15 mLs by mouth every 6 (six) hours as needed for indigestion or heartburn.   baclofen 10 MG tablet Commonly known as: LIORESAL Take 10 mg by mouth 3 (three) times daily.   BIOFREEZE EX Apply 1 application topically 4 (four) times daily as needed (knee pain).   carvedilol 3.125 MG tablet Commonly known as: COREG Take 1 tablet (3.125 mg total) by mouth 2 (two) times daily with a meal.   cefTRIAXone  IVPB Commonly known as: ROCEPHIN Inject 2 g into the vein daily. Indication: Sacral osteomyelitis  Last Day of Therapy: 03/11/2019 Labs - Once weekly: CBC/D and BMP, Labs - Every other week: ESR and CRP  collagenase ointment Commonly known as: SANTYL Apply topically daily. Start taking on: February 03, 2019   feeding supplement (ENSURE ENLIVE) Liqd Take 237 mLs by mouth 2 (two) times daily between meals.   gabapentin 300 MG capsule Commonly known as: NEURONTIN Take 300 mg by mouth See admin instructions. Take one capsule (300 mg) by mouth four times daily, may take an extra capsule (300 mg) at night as needed for pain   ibuprofen 200 MG tablet Commonly known as: ADVIL Take 600 mg by mouth every 6 (six) hours as needed (knee pain).   meloxicam 7.5 MG tablet Commonly known as: MOBIC Take 7.5 mg by mouth 2 (two) times daily.   methocarbamol 500 MG tablet Commonly known as: ROBAXIN Take 500 mg by mouth 3 (three) times daily. For spasms   sodium  hypochlorite 0.125 % Soln Commonly known as: DAKIN'S 1/4 STRENGTH Irrigate with as directed daily. Start taking on: February 03, 2019   vancomycin  IVPB Inject 1,000 mg into the vein daily. Indication: Sacral osteomyelitis  Last Day of Therapy:  03/11/2019 Labs - _0 /21/20 1611           Durable Medical Equipment  (From admission, onward)         Start     Ordered   01/29/19 1058  For home use only DME Specialty mattress  Once    Comments: LALM mattress needed for stage 4 ulcer on sacrum   01/29/19 1101   01/29/19 1056  For home use only DME wheelchair cushion (seat and back)  Once    Comments: Need to be Roho cushions for the  wheelchair d/t stage 4 ulcer on sacrum   01/29/19 1056   01/29/19 0831  For home use only DME lightweight manual wheelchair with seat cushion  Once    Comments: Patient suffers from multiple sclerosis which impairs their ability to perform daily activities like ambulating, bathing in the home.  A walker will not resolve  issue with performing activities of daily living. A wheelchair will allow patient to safely perform daily activities. Patient is not able to propel themselves in the home using a standard weight wheelchair due to weakness.  Patient can self propel in the lightweight wheelchair. Length of need lifetime. Accessories: elevating leg rests (ELRs), wheel locks, extensions and anti-tippers.   01/29/19 9242          Time coordinating discharge: 35 minutes  The results of significant diagnostics from this hospitalization (including imaging, microbiology, ancillary and laboratory) are listed below for reference.    Procedures and Diagnostic Studies:   No results found.   Labs:   Basic Metabolic Panel: Recent Labs  Lab 01/27/19 0409  01/29/19 0232 01/30/19 0620 01/31/19 0433 02/01/19 0553 02/02/19 0539  NA 140   < > 140 140 136 141 141  K 3.1*   < > 4.2 4.3 4.2 3.7 3.9  CL 103   < > 103 103 100 102 100  CO2 27   < > _0 32 31  GLUCOSE 98   < > 90 98 97 98 96  BUN 8   < > _1 CREATININE 0.57   < > 0.80 0.89 0.90 0.88 1.00  CALCIUM 8.7*   < > 8.8* 8.8* 8.7* 8.8* 8.8*  MG 1.7  --   --   --   --   --   --    < > = values in this interval not displayed.   GFR Estimated Creatinine Clearance: 80.1 mL/min (by C-G formula based on SCr of 1 mg/dL). Liver Function Tests: No results for input(s): AST, ALT, ALKPHOS, BILITOT, PROT, ALBUMIN in the last 168 hours. No results for input(s): LIPASE, AMYLASE in the last 168 hours. No results for input(s): AMMONIA in the last 168 hours. Coagulation profile No results for input(s): INR, PROTIME in the last 168 hours.   CBC: Recent Labs  Lab 01/27/19 0409  WBC 8.9  NEUTROABS 4.2  HGB 11.6*  HCT 36.0  MCV 98.1  PLT 230   Cardiac Enzymes: No results for input(s): CKTOTAL, CKMB, CKMBINDEX, TROPONINI in the last 168 hours. BNP: Invalid input(s): POCBNP CBG: No results for input(s): GLUCAP in the last 168 hours. D-Dimer No results for input(s): DDIMER in the last 72 hours. Hgb A1c No results for input(s): HGBA1C in the last 72 hours. Lipid Profile No results for input(s): CHOL, HDL, LDLCALC, TRIG, CHOLHDL, LDLDIRECT in the last 72 hours. Thyroid function studies No results for input(s): TSH, T4TOTAL, T3FREE, THYROIDAB in the last 72 hours.  Invalid input(s): FREET3 Anemia work up No results for input(s): VITAMINB12, FOLATE, FERRITIN, TIBC, IRON, RETICCTPCT in the last 72 hours. Microbiology Recent Results (from the past 240 hour(s))  Blood culture (routine x 2)     Status: None   Collection Time: 01/25/19  8:40 PM   Specimen: BLOOD  Result Value Ref Range Status   Specimen Description BLOOD RIGHT ANTECUBITAL  Final   Special Requests   Final    BOTTLES DRAWN AEROBIC ONLY Blood Culture results may not be optimal due to an inadequate volume of blood received in culture bottles   Culture   Final    NO GROWTH 5 DAYS Performed at [facility], [place] [address], [place], [place] [address]    Report Status 01/30/2019 FINAL  Final  Blood culture (routine x 2)     Status: None   Collection Time: 01/25/19  8:40 PM   Specimen: BLOOD  Result Value Ref Range Status   Specimen Description BLOOD BLOOD RIGHT FOREARM  Final   Special Requests   Final    BOTTLES DRAWN AEROBIC AND ANAEROBIC  Blood Culture results may not be optimal due to an inadequate volume of blood received in culture bottles   Culture   Final    NO GROWTH 5 DAYS Performed at [facility] [address] [place], [place] [address]    Report Status 01/30/2019 FINAL  Final  SARS CORONAVIRUS 2 (TAT 6-24 HRS)  Nasopharyngeal Nasopharyngeal Swab     Status: None   Collection Time: 01/25/19 10:35 PM   Specimen: Nasopharyngeal Swab  Result Value Ref Range Status   SARS Coronavirus 2 NEGATIVE NEGATIVE Final    Comment: (NOTE) SARS-CoV-2 target nucleic acids are NOT DETECTED. The SARS-CoV-2 RNA is generally detectable in upper and lower respiratory specimens during the acute phase of infection. Negative results do not preclude SARS-CoV-2 infection, do not rule out co-infections with other pathogens, and should not be used as the sole basis for treatment or other patient management decisions. Negative results must be combined with clinical observations, patient history, and epidemiological information. The expected result is Negative. Fact Sheet for Patients: SugarRoll.be Fact Sheet for Healthcare Providers: https://www.woods-mathews.com/ This test is not yet approved or cleared by the [place] FDA and  has been authorized for detection and/or diagnosis of SARS-CoV-2 by FDA under an Emergency Use Authorization (EUA). This EUA will remain  in effect (meaning this test can be used) for the duration of the COVID-19 declaration under Section 56 4(b)(1) of the Act, 21 U.S.C. section 360bbb-3(b)(1), unless the authorization is terminated or revoked sooner. Performed at [facility], [place] [address] [place], [place] [address]     Please note: You were cared for by a hospitalist during your hospital stay. Once you are discharged, your primary care physician will handle any further medical issues. Please note that NO REFILLS for any discharge medications will be authorized once you are discharged, as it is imperative that you return to your primary care physician (or establish a relationship with a primary care physician if you do not have one) for your post hospital discharge needs so that they can reassess your need for medications and monitor your lab  values.  Signed: Terrilee Croak  [facility] 02/02/2019, 10:36 AM

## 2019-02-02 NOTE — TOC Transition Note (Signed)
Transition of Care [facility]) - CM/SW Discharge Note   Patient Details  Name: [name] MRN: [id number] Date of Birth: 10-16-1970  [facility] CM/SW Contact:  Pollie Friar, RN Phone Number: 02/02/2019, 12:00 PM   Clinical Narrative:    Pt is discharging home today. Pt has PICC line in place. IV antibiotics have been delivered to the home. Cory with [facility] aware of d/c. Air mattress has been switched out and fits the bed.  Pt to transport via [facility]. [facility] arranged. Bedside RN updated and d/c packet at the desk.    Final next level of care: [place] Barriers to Discharge: No Barriers Identified   Patient Goals and CMS Choice Patient states their goals for this hospitalization and ongoing recovery are:: to go home CMS Medicare.gov Compare Post Acute Care list provided to:: Patient Represenative (must comment) Choice offered to / list presented to : Patient, Parent  Discharge Placement                Patient to be transferred to facility by: [facility] Name of family member notified: [name] Patient and family notified of of transfer: 02/02/19  Discharge Plan and Services   Discharge Planning Services: CM Consult            DME Arranged: Specialty mattress DME Agency: [employer]       HH Arranged: RN, PT, OT, Nurse's Aide, Social Work [employer] Agency: [facility]     Representative spoke with at [place]: Tommi Rumps is aware of d/c today  Social Determinants of Health (SDOH) Interventions     Readmission Risk Interventions No flowsheet data found.

## 2019-02-02 NOTE — Progress Notes (Signed)
Pharmacy Antibiotic Note  [name] is a 48 y.o. female admitted on 01/25/2019 with cellulitis.  She is being discharged on 6 weeks of vancomycin and ceftriaxone for sacral osteomyelitis to end on 03/11/19.  Previous vancomycin AUC was elevated and dose adjusted on 01/30/19.  SCr 1, nCrCL 76 ml/min, afebrile, WBC WNL.  Plan: Continue vanc 1gm IV Q24H Continue CTX 2gm IV Q24H Monitor renal fxn, clinical progress, repeat vanc levels this week if still here   Height: 5\' 2"  (157.5 cm) Weight: 250 lb (113.4 kg) IBW/kg (Calculated) : 50.1  Temp (24hrs), Avg:98.6 F (37 C), Min:98.1 F (36.7 C), Max:99 F (37.2 C)  Recent Labs  Lab 01/27/19 0409  01/29/19 0232 01/30/19 0258 01/30/19 0620 01/31/19 0433 02/01/19 0553 02/02/19 0539  WBC 8.9  --   --   --   --   --   --   --   CREATININE 0.57   < > 0.80  --  0.89 0.90 0.88 1.00  VANCOTROUGH  --   --   --  26*  --   --   --   --   VANCOPEAK  --   --   --   --  55*  --   --   --    < > = values in this interval not displayed.    Estimated Creatinine Clearance: 80.1 mL/min (by C-G formula based on SCr of 1 mg/dL).    Allergies  Allergen Reactions  . Tizanidine Diarrhea  . Tomato     11/16 zosyn >>11/19 11/16 vanc >> (12/31) 11/19 Rocephin>> (12/31)  11/20 VP/VT 55/26, AUC = 954 on 1750 q24 >> decr 1g q24  11/16 BCx - ngtd 11/16 covid - neg  Jaceyon Strole D. Mina Marble, PharmD, BCPS, [place] 02/02/2019, 12:44 PM

## 2019-02-03 ENCOUNTER — Telehealth: Payer: Self-pay | Admitting: *Deleted

## 2019-02-03 DIAGNOSIS — N959 Unspecified menopausal and perimenopausal disorder: Secondary | ICD-10-CM | POA: Diagnosis not present

## 2019-02-03 DIAGNOSIS — G8220 Paraplegia, unspecified: Secondary | ICD-10-CM | POA: Diagnosis not present

## 2019-02-03 DIAGNOSIS — L89324 Pressure ulcer of left buttock, stage 4: Secondary | ICD-10-CM | POA: Diagnosis not present

## 2019-02-03 DIAGNOSIS — L03312 Cellulitis of back [any part except buttock]: Secondary | ICD-10-CM | POA: Diagnosis not present

## 2019-02-03 DIAGNOSIS — G479 Sleep disorder, unspecified: Secondary | ICD-10-CM | POA: Diagnosis not present

## 2019-02-03 DIAGNOSIS — M4628 Osteomyelitis of vertebra, sacral and sacrococcygeal region: Secondary | ICD-10-CM | POA: Diagnosis not present

## 2019-02-03 DIAGNOSIS — G35 Multiple sclerosis: Secondary | ICD-10-CM | POA: Diagnosis not present

## 2019-02-03 DIAGNOSIS — I10 Essential (primary) hypertension: Secondary | ICD-10-CM | POA: Diagnosis not present

## 2019-02-03 NOTE — Telephone Encounter (Signed)
Homehealth nurse called, asked for permission to draw labs on Monday due to the Thanksgiving holiday.  She will draw vancomycin trough and BMP on Friday 11/25 instead, as patient was just recently discharged from the hospital and will need updated lab information in their system.    RN also clarified that [facility] infusion pharmacy would be dosing vancomycin, not [facility], so to expect lab orders/medication orders to come from [facility]. Home health nurse was unsure if NP could sign orders, or if these needed to go to his supervising physician.  RN relayed that NPs were allowed to sign their own home health orders now, that it did not need to go to supervising MD. RN relayed these questions to Emmit Pomfret at [facility], as well.  She will follow up with home health nurse. Landis Gandy, RN

## 2019-02-04 DIAGNOSIS — L89324 Pressure ulcer of left buttock, stage 4: Secondary | ICD-10-CM | POA: Diagnosis not present

## 2019-02-04 DIAGNOSIS — M4628 Osteomyelitis of vertebra, sacral and sacrococcygeal region: Secondary | ICD-10-CM | POA: Diagnosis not present

## 2019-02-04 DIAGNOSIS — L03312 Cellulitis of back [any part except buttock]: Secondary | ICD-10-CM | POA: Diagnosis not present

## 2019-02-05 DIAGNOSIS — G8220 Paraplegia, unspecified: Secondary | ICD-10-CM | POA: Diagnosis not present

## 2019-02-05 DIAGNOSIS — G479 Sleep disorder, unspecified: Secondary | ICD-10-CM | POA: Diagnosis not present

## 2019-02-05 DIAGNOSIS — N959 Unspecified menopausal and perimenopausal disorder: Secondary | ICD-10-CM | POA: Diagnosis not present

## 2019-02-05 DIAGNOSIS — M4628 Osteomyelitis of vertebra, sacral and sacrococcygeal region: Secondary | ICD-10-CM | POA: Diagnosis not present

## 2019-02-05 DIAGNOSIS — L89154 Pressure ulcer of sacral region, stage 4: Secondary | ICD-10-CM | POA: Diagnosis not present

## 2019-02-05 DIAGNOSIS — L89324 Pressure ulcer of left buttock, stage 4: Secondary | ICD-10-CM | POA: Diagnosis not present

## 2019-02-05 DIAGNOSIS — G35 Multiple sclerosis: Secondary | ICD-10-CM | POA: Diagnosis not present

## 2019-02-05 DIAGNOSIS — L03312 Cellulitis of back [any part except buttock]: Secondary | ICD-10-CM | POA: Diagnosis not present

## 2019-02-05 DIAGNOSIS — I10 Essential (primary) hypertension: Secondary | ICD-10-CM | POA: Diagnosis not present

## 2019-02-08 DIAGNOSIS — I10 Essential (primary) hypertension: Secondary | ICD-10-CM | POA: Diagnosis not present

## 2019-02-08 DIAGNOSIS — L89154 Pressure ulcer of sacral region, stage 4: Secondary | ICD-10-CM | POA: Diagnosis not present

## 2019-02-08 DIAGNOSIS — L89324 Pressure ulcer of left buttock, stage 4: Secondary | ICD-10-CM | POA: Diagnosis not present

## 2019-02-08 DIAGNOSIS — G35 Multiple sclerosis: Secondary | ICD-10-CM | POA: Diagnosis not present

## 2019-02-08 DIAGNOSIS — L03312 Cellulitis of back [any part except buttock]: Secondary | ICD-10-CM | POA: Diagnosis not present

## 2019-02-08 DIAGNOSIS — N959 Unspecified menopausal and perimenopausal disorder: Secondary | ICD-10-CM | POA: Diagnosis not present

## 2019-02-08 DIAGNOSIS — M4628 Osteomyelitis of vertebra, sacral and sacrococcygeal region: Secondary | ICD-10-CM | POA: Diagnosis not present

## 2019-02-08 DIAGNOSIS — G479 Sleep disorder, unspecified: Secondary | ICD-10-CM | POA: Diagnosis not present

## 2019-02-08 DIAGNOSIS — G8220 Paraplegia, unspecified: Secondary | ICD-10-CM | POA: Diagnosis not present

## 2019-02-10 ENCOUNTER — Inpatient Hospital Stay: Payer: Medicare PPO | Admitting: Family

## 2019-02-11 DIAGNOSIS — L89899 Pressure ulcer of other site, unspecified stage: Secondary | ICD-10-CM | POA: Diagnosis not present

## 2019-02-11 DIAGNOSIS — I10 Essential (primary) hypertension: Secondary | ICD-10-CM | POA: Diagnosis not present

## 2019-02-11 DIAGNOSIS — M4628 Osteomyelitis of vertebra, sacral and sacrococcygeal region: Secondary | ICD-10-CM | POA: Diagnosis not present

## 2019-02-11 DIAGNOSIS — G479 Sleep disorder, unspecified: Secondary | ICD-10-CM | POA: Diagnosis not present

## 2019-02-11 DIAGNOSIS — N959 Unspecified menopausal and perimenopausal disorder: Secondary | ICD-10-CM | POA: Diagnosis not present

## 2019-02-11 DIAGNOSIS — L03312 Cellulitis of back [any part except buttock]: Secondary | ICD-10-CM | POA: Diagnosis not present

## 2019-02-11 DIAGNOSIS — G35 Multiple sclerosis: Secondary | ICD-10-CM | POA: Diagnosis not present

## 2019-02-11 DIAGNOSIS — Z79899 Other long term (current) drug therapy: Secondary | ICD-10-CM | POA: Diagnosis not present

## 2019-02-11 DIAGNOSIS — L8994 Pressure ulcer of unspecified site, stage 4: Secondary | ICD-10-CM | POA: Diagnosis not present

## 2019-02-11 DIAGNOSIS — L89324 Pressure ulcer of left buttock, stage 4: Secondary | ICD-10-CM | POA: Diagnosis not present

## 2019-02-11 DIAGNOSIS — L89514 Pressure ulcer of right ankle, stage 4: Secondary | ICD-10-CM | POA: Diagnosis not present

## 2019-02-11 DIAGNOSIS — G8220 Paraplegia, unspecified: Secondary | ICD-10-CM | POA: Diagnosis not present

## 2019-02-12 ENCOUNTER — Telehealth: Payer: Self-pay

## 2019-02-12 DIAGNOSIS — N959 Unspecified menopausal and perimenopausal disorder: Secondary | ICD-10-CM | POA: Diagnosis not present

## 2019-02-12 DIAGNOSIS — L89154 Pressure ulcer of sacral region, stage 4: Secondary | ICD-10-CM | POA: Diagnosis not present

## 2019-02-12 DIAGNOSIS — G8220 Paraplegia, unspecified: Secondary | ICD-10-CM | POA: Diagnosis not present

## 2019-02-12 DIAGNOSIS — I10 Essential (primary) hypertension: Secondary | ICD-10-CM | POA: Diagnosis not present

## 2019-02-12 DIAGNOSIS — G479 Sleep disorder, unspecified: Secondary | ICD-10-CM | POA: Diagnosis not present

## 2019-02-12 DIAGNOSIS — L03312 Cellulitis of back [any part except buttock]: Secondary | ICD-10-CM | POA: Diagnosis not present

## 2019-02-12 DIAGNOSIS — G35 Multiple sclerosis: Secondary | ICD-10-CM | POA: Diagnosis not present

## 2019-02-12 DIAGNOSIS — L89324 Pressure ulcer of left buttock, stage 4: Secondary | ICD-10-CM | POA: Diagnosis not present

## 2019-02-12 DIAGNOSIS — M4628 Osteomyelitis of vertebra, sacral and sacrococcygeal region: Secondary | ICD-10-CM | POA: Diagnosis not present

## 2019-02-12 NOTE — Telephone Encounter (Signed)
[name] called back and advised she did find an open lab to process the CBC sample.

## 2019-02-12 NOTE — Telephone Encounter (Signed)
Denise the Rn from home health called to advise the patient lab has been drawn but all the [facility] in the area are closed and she is having a hard time finding somewhere to have blood processed. Advised if she is not able to find an open lab today she will go back out Monday 02/15/19. She also reports the rash is worse today. Patient back, arms and abdomen are covered in the rash but she is not having any trouble breathing or swelling of the mouth, tongue or throat. Patient says if does not even itch but she is taking the Benadryl as suggested and knows to seek help if anything changes. Advised will let the provider know what is going on.

## 2019-02-12 NOTE — Telephone Encounter (Signed)
Received call from [facility] states she received a call from patient's mother stating that she has a rash that has developed on her abdomen and breast. Patient noted to have cancelled appointment on 12/2 [facility] states patient would need an evisit due to being completely bed bond. Patient is currently taking IV Ceftiaxone and Vanc. Due to sacral osteomyelitis Eugenia Mcalpine .

## 2019-02-12 NOTE — Telephone Encounter (Signed)
Received page from [place] and provided verbal order for ertapenem.

## 2019-02-12 NOTE — Telephone Encounter (Signed)
Called Denise to advise of change in medication per Terri Piedra and was told they can not take verbals it has to be in witting and if she has not had this medication before they do not do first does in the home and she is hard to move. She will ask her company if they can make an exception but it will still be Monday before they get new medication.   Will have patient stop the Ceftriaxone in the mean time. Langley Gauss will call back to say if they can start the Ertapenem in the home. Order faxed to [name] [phone] [facility] signed) Will let provider know what is happening.  Patient informed to stop Ceftriaxone and new medication is in the works and we will call back Monday.

## 2019-02-12 NOTE — Telephone Encounter (Signed)
Can also use Benedryl 12.5-25 mg as need for itching and rash.

## 2019-02-12 NOTE — Telephone Encounter (Signed)
[facility] called and gave verbal order for CBC w/ diff, continue antibiotics at this time unless she begins to develop worsening rash or having tongue/lip swelling. May use Benedryl 12.5-25mg  as need for itching and rash. Verbal order read back and understood.  Eugenia Mcalpine

## 2019-02-12 NOTE — Telephone Encounter (Signed)
I would suspect that if this is an antibiotic related rash it would be more widespread. Please have Home Health check a CBC w/ differential to see if she has eosinophilia. Continue with antibiotics at this time unless she begins to develop worsening rash or having tongue/lip swelling prior to blood work results. Ok to schedule an appointment for video visit for next week if needed.

## 2019-02-12 NOTE — Telephone Encounter (Signed)
With worsening rash will stop ceftriaxone and start ertapenem 1 g every 24 hours IV with continued vancomycin.

## 2019-02-15 ENCOUNTER — Telehealth: Payer: Self-pay

## 2019-02-15 DIAGNOSIS — N959 Unspecified menopausal and perimenopausal disorder: Secondary | ICD-10-CM | POA: Diagnosis not present

## 2019-02-15 DIAGNOSIS — G479 Sleep disorder, unspecified: Secondary | ICD-10-CM | POA: Diagnosis not present

## 2019-02-15 DIAGNOSIS — L03312 Cellulitis of back [any part except buttock]: Secondary | ICD-10-CM | POA: Diagnosis not present

## 2019-02-15 DIAGNOSIS — I10 Essential (primary) hypertension: Secondary | ICD-10-CM | POA: Diagnosis not present

## 2019-02-15 DIAGNOSIS — G35 Multiple sclerosis: Secondary | ICD-10-CM | POA: Diagnosis not present

## 2019-02-15 DIAGNOSIS — G8220 Paraplegia, unspecified: Secondary | ICD-10-CM | POA: Diagnosis not present

## 2019-02-15 DIAGNOSIS — L8994 Pressure ulcer of unspecified site, stage 4: Secondary | ICD-10-CM | POA: Diagnosis not present

## 2019-02-15 DIAGNOSIS — M4628 Osteomyelitis of vertebra, sacral and sacrococcygeal region: Secondary | ICD-10-CM | POA: Diagnosis not present

## 2019-02-15 DIAGNOSIS — L89324 Pressure ulcer of left buttock, stage 4: Secondary | ICD-10-CM | POA: Diagnosis not present

## 2019-02-15 NOTE — Telephone Encounter (Signed)
Voicemail:  [name] calling regarding confusion with IV medications order.   The family held vancomycin since Friday, nurse states this was not th correct medication to stop but the family is insisting they were told to hold vancomycin.     Return call: left message with [facility] nurse to call our office for clarification.    Laverle Patter, RN

## 2019-02-16 DIAGNOSIS — M4628 Osteomyelitis of vertebra, sacral and sacrococcygeal region: Secondary | ICD-10-CM | POA: Diagnosis not present

## 2019-02-16 DIAGNOSIS — L8994 Pressure ulcer of unspecified site, stage 4: Secondary | ICD-10-CM | POA: Diagnosis not present

## 2019-02-16 NOTE — Telephone Encounter (Signed)
Written order faxed pm 02-12-19 to [facility]:  Stop Ceftriaxone and start Ertapenem 1 gram diailyfor duration of therapy . Orders written by Terri Piedra, NP   Laverle Patter, RN

## 2019-02-18 DIAGNOSIS — I10 Essential (primary) hypertension: Secondary | ICD-10-CM | POA: Diagnosis not present

## 2019-02-18 DIAGNOSIS — L89324 Pressure ulcer of left buttock, stage 4: Secondary | ICD-10-CM | POA: Diagnosis not present

## 2019-02-18 DIAGNOSIS — L03312 Cellulitis of back [any part except buttock]: Secondary | ICD-10-CM | POA: Diagnosis not present

## 2019-02-18 DIAGNOSIS — N959 Unspecified menopausal and perimenopausal disorder: Secondary | ICD-10-CM | POA: Diagnosis not present

## 2019-02-18 DIAGNOSIS — L89154 Pressure ulcer of sacral region, stage 4: Secondary | ICD-10-CM | POA: Diagnosis not present

## 2019-02-18 DIAGNOSIS — G479 Sleep disorder, unspecified: Secondary | ICD-10-CM | POA: Diagnosis not present

## 2019-02-18 DIAGNOSIS — M4628 Osteomyelitis of vertebra, sacral and sacrococcygeal region: Secondary | ICD-10-CM | POA: Diagnosis not present

## 2019-02-18 DIAGNOSIS — G35 Multiple sclerosis: Secondary | ICD-10-CM | POA: Diagnosis not present

## 2019-02-18 DIAGNOSIS — G8220 Paraplegia, unspecified: Secondary | ICD-10-CM | POA: Diagnosis not present

## 2019-02-19 ENCOUNTER — Telehealth: Payer: Self-pay

## 2019-02-19 DIAGNOSIS — G479 Sleep disorder, unspecified: Secondary | ICD-10-CM | POA: Diagnosis not present

## 2019-02-19 DIAGNOSIS — L03312 Cellulitis of back [any part except buttock]: Secondary | ICD-10-CM | POA: Diagnosis not present

## 2019-02-19 DIAGNOSIS — M4628 Osteomyelitis of vertebra, sacral and sacrococcygeal region: Secondary | ICD-10-CM | POA: Diagnosis not present

## 2019-02-19 DIAGNOSIS — L89324 Pressure ulcer of left buttock, stage 4: Secondary | ICD-10-CM | POA: Diagnosis not present

## 2019-02-19 DIAGNOSIS — I10 Essential (primary) hypertension: Secondary | ICD-10-CM | POA: Diagnosis not present

## 2019-02-19 DIAGNOSIS — G8220 Paraplegia, unspecified: Secondary | ICD-10-CM | POA: Diagnosis not present

## 2019-02-19 DIAGNOSIS — N959 Unspecified menopausal and perimenopausal disorder: Secondary | ICD-10-CM | POA: Diagnosis not present

## 2019-02-19 DIAGNOSIS — G35 Multiple sclerosis: Secondary | ICD-10-CM | POA: Diagnosis not present

## 2019-02-19 NOTE — Telephone Encounter (Signed)
Per Janene Madeira, NP called patient to schedule appointment with office. Spoke with patient's mother who states patient is bedridden, and unable to come to in person appointments. Offered patient Evisit with Terri Piedra, FNP on 12/23. Patient and mother are okay with appointment. Will sign up for mychart today. [place]

## 2019-02-19 NOTE — Telephone Encounter (Signed)
duplicate

## 2019-02-19 NOTE — Telephone Encounter (Signed)
-----   Message from Franklin Callas, NP sent at 02/19/2019  2:35 PM EST ----- Regarding: schedule with Greg/Cynthia Can you call to set up appointment with Marya Amsler or Caren Griffins within next 2 weeks please? IV antibiotic patient without appt. Seems she cancelled recently with [facility] Thank you

## 2019-02-19 NOTE — Telephone Encounter (Signed)
You are wonderful thank you

## 2019-02-21 DIAGNOSIS — M4628 Osteomyelitis of vertebra, sacral and sacrococcygeal region: Secondary | ICD-10-CM | POA: Diagnosis not present

## 2019-02-21 DIAGNOSIS — L8994 Pressure ulcer of unspecified site, stage 4: Secondary | ICD-10-CM | POA: Diagnosis not present

## 2019-02-22 DIAGNOSIS — M4628 Osteomyelitis of vertebra, sacral and sacrococcygeal region: Secondary | ICD-10-CM | POA: Diagnosis not present

## 2019-02-22 DIAGNOSIS — L89514 Pressure ulcer of right ankle, stage 4: Secondary | ICD-10-CM | POA: Diagnosis not present

## 2019-02-22 DIAGNOSIS — G479 Sleep disorder, unspecified: Secondary | ICD-10-CM | POA: Diagnosis not present

## 2019-02-22 DIAGNOSIS — I10 Essential (primary) hypertension: Secondary | ICD-10-CM | POA: Diagnosis not present

## 2019-02-22 DIAGNOSIS — L89324 Pressure ulcer of left buttock, stage 4: Secondary | ICD-10-CM | POA: Diagnosis not present

## 2019-02-22 DIAGNOSIS — L03312 Cellulitis of back [any part except buttock]: Secondary | ICD-10-CM | POA: Diagnosis not present

## 2019-02-22 DIAGNOSIS — N959 Unspecified menopausal and perimenopausal disorder: Secondary | ICD-10-CM | POA: Diagnosis not present

## 2019-02-22 DIAGNOSIS — G35 Multiple sclerosis: Secondary | ICD-10-CM | POA: Diagnosis not present

## 2019-02-22 DIAGNOSIS — G8220 Paraplegia, unspecified: Secondary | ICD-10-CM | POA: Diagnosis not present

## 2019-02-23 ENCOUNTER — Telehealth: Payer: Self-pay

## 2019-02-23 DIAGNOSIS — G8220 Paraplegia, unspecified: Secondary | ICD-10-CM | POA: Diagnosis not present

## 2019-02-23 DIAGNOSIS — N959 Unspecified menopausal and perimenopausal disorder: Secondary | ICD-10-CM | POA: Diagnosis not present

## 2019-02-23 DIAGNOSIS — L03312 Cellulitis of back [any part except buttock]: Secondary | ICD-10-CM | POA: Diagnosis not present

## 2019-02-23 DIAGNOSIS — G35 Multiple sclerosis: Secondary | ICD-10-CM | POA: Diagnosis not present

## 2019-02-23 DIAGNOSIS — L89324 Pressure ulcer of left buttock, stage 4: Secondary | ICD-10-CM | POA: Diagnosis not present

## 2019-02-23 DIAGNOSIS — M4628 Osteomyelitis of vertebra, sacral and sacrococcygeal region: Secondary | ICD-10-CM | POA: Diagnosis not present

## 2019-02-23 DIAGNOSIS — I10 Essential (primary) hypertension: Secondary | ICD-10-CM | POA: Diagnosis not present

## 2019-02-23 DIAGNOSIS — G479 Sleep disorder, unspecified: Secondary | ICD-10-CM | POA: Diagnosis not present

## 2019-02-23 NOTE — Telephone Encounter (Signed)
ERROR

## 2019-02-25 DIAGNOSIS — I10 Essential (primary) hypertension: Secondary | ICD-10-CM | POA: Diagnosis not present

## 2019-02-25 DIAGNOSIS — G479 Sleep disorder, unspecified: Secondary | ICD-10-CM | POA: Diagnosis not present

## 2019-02-25 DIAGNOSIS — G35 Multiple sclerosis: Secondary | ICD-10-CM | POA: Diagnosis not present

## 2019-02-25 DIAGNOSIS — L89324 Pressure ulcer of left buttock, stage 4: Secondary | ICD-10-CM | POA: Diagnosis not present

## 2019-02-25 DIAGNOSIS — G8220 Paraplegia, unspecified: Secondary | ICD-10-CM | POA: Diagnosis not present

## 2019-02-25 DIAGNOSIS — N959 Unspecified menopausal and perimenopausal disorder: Secondary | ICD-10-CM | POA: Diagnosis not present

## 2019-02-25 DIAGNOSIS — M4628 Osteomyelitis of vertebra, sacral and sacrococcygeal region: Secondary | ICD-10-CM | POA: Diagnosis not present

## 2019-02-25 DIAGNOSIS — L03312 Cellulitis of back [any part except buttock]: Secondary | ICD-10-CM | POA: Diagnosis not present

## 2019-02-25 DIAGNOSIS — L89154 Pressure ulcer of sacral region, stage 4: Secondary | ICD-10-CM | POA: Diagnosis not present

## 2019-02-27 DIAGNOSIS — L8994 Pressure ulcer of unspecified site, stage 4: Secondary | ICD-10-CM | POA: Diagnosis not present

## 2019-02-27 DIAGNOSIS — M4628 Osteomyelitis of vertebra, sacral and sacrococcygeal region: Secondary | ICD-10-CM | POA: Diagnosis not present

## 2019-03-01 DIAGNOSIS — L03312 Cellulitis of back [any part except buttock]: Secondary | ICD-10-CM | POA: Diagnosis not present

## 2019-03-01 DIAGNOSIS — G8220 Paraplegia, unspecified: Secondary | ICD-10-CM | POA: Diagnosis not present

## 2019-03-01 DIAGNOSIS — N959 Unspecified menopausal and perimenopausal disorder: Secondary | ICD-10-CM | POA: Diagnosis not present

## 2019-03-01 DIAGNOSIS — G35 Multiple sclerosis: Secondary | ICD-10-CM | POA: Diagnosis not present

## 2019-03-01 DIAGNOSIS — G479 Sleep disorder, unspecified: Secondary | ICD-10-CM | POA: Diagnosis not present

## 2019-03-01 DIAGNOSIS — I10 Essential (primary) hypertension: Secondary | ICD-10-CM | POA: Diagnosis not present

## 2019-03-01 DIAGNOSIS — L89154 Pressure ulcer of sacral region, stage 4: Secondary | ICD-10-CM | POA: Diagnosis not present

## 2019-03-01 DIAGNOSIS — L89324 Pressure ulcer of left buttock, stage 4: Secondary | ICD-10-CM | POA: Diagnosis not present

## 2019-03-01 DIAGNOSIS — M4628 Osteomyelitis of vertebra, sacral and sacrococcygeal region: Secondary | ICD-10-CM | POA: Diagnosis not present

## 2019-03-02 DIAGNOSIS — G8220 Paraplegia, unspecified: Secondary | ICD-10-CM | POA: Diagnosis not present

## 2019-03-02 DIAGNOSIS — G35 Multiple sclerosis: Secondary | ICD-10-CM | POA: Diagnosis not present

## 2019-03-03 ENCOUNTER — Other Ambulatory Visit: Payer: Self-pay

## 2019-03-03 ENCOUNTER — Telehealth (INDEPENDENT_AMBULATORY_CARE_PROVIDER_SITE_OTHER): Payer: Medicare PPO | Admitting: Family

## 2019-03-03 ENCOUNTER — Encounter: Payer: Self-pay | Admitting: Family

## 2019-03-03 DIAGNOSIS — M4628 Osteomyelitis of vertebra, sacral and sacrococcygeal region: Secondary | ICD-10-CM

## 2019-03-03 NOTE — Progress Notes (Signed)
Subjective:    Patient ID: [name], female    DOB: Nov 05, 1970, 48 y.o.   MRN: [id number]  Chief Complaint  Patient presents with  . Osteomyelitis     HPI:  [name] is a 48 y.o. female with previous medical history of multiple sclerosis complicated by being bedbound with known sacral decubitus ulcer and recently admitted with worsening pain and bleeding concerning for infection.  It was found a normal white blood cell count of 9.2 with elevated sedimentation rate of 71 and CRP of 13.1.  MRI imaging with possible osteomyelitis of the distal sacrum just above the coccyx with no discrete abscesses or fluid collections.  Blood cultures obtained were negative and remained without growth.  Surgery was consulted and declined any surgical interventions recommending hydrotherapy while inpatient.  Initially placed on vancomycin and ceftriaxone for broad-spectrum coverage of culture-negative osteomyelitis, however experienced a new onset rash shortly into treatment and has been changed to ertapenem and continued on vancomycin.  [name] continues to receive ertapenem and vancomycin with no adverse side effects.  She is working on offloading the site and notes that her nutrition has improved.  Per her wound care the wound has continued to improve and decrease in size.  She has no systemic symptoms of fevers, chills, or sweats.  She does have new onset numbness and tingling in bilateral hands and questions if this is related to her PICC line.  PICC line is functioning appropriately and without signs of infection.  Allergies  Allergen Reactions  . Tizanidine Diarrhea  . Tomato       Outpatient Medications Prior to Visit  Medication Sig Dispense Refill  . acetaminophen (TYLENOL) 500 MG tablet Take 1,000 mg by mouth every 6 (six) hours.    Marland Kitchen alum & mag hydroxide-simeth (MAALOX/MYLANTA) 200-200-20 MG/5ML suspension Take 15 mLs by mouth every 6 (six) hours as needed for indigestion or  heartburn.    . baclofen (LIORESAL) 10 MG tablet Take 10 mg by mouth 3 (three) times daily.    . carvedilol (COREG) 3.125 MG tablet Take 1 tablet (3.125 mg total) by mouth 2 (two) times daily with a meal. 60 tablet 0  . cefTRIAXone (ROCEPHIN) IVPB Inject 2 g into the vein daily. Indication: Sacral osteomyelitis  Last Day of Therapy: 03/11/2019 Labs - Once weekly: CBC/D and BMP, Labs - Every other week: ESR and CRP 84 Units 0  . collagenase (SANTYL) ointment Apply topically daily. 30 g 0  . feeding supplement, ENSURE ENLIVE, (ENSURE ENLIVE) LIQD Take 237 mLs by mouth 2 (two) times daily between meals. 237 mL 12  . gabapentin (NEURONTIN) 300 MG capsule Take 300 mg by mouth See admin instructions. Take one capsule (300 mg) by mouth four times daily, may take an extra capsule (300 mg) at night as needed for pain    . ibuprofen (ADVIL) 200 MG tablet Take 600 mg by mouth every 6 (six) hours as needed (knee pain).    . meloxicam (MOBIC) 7.5 MG tablet Take 7.5 mg by mouth 2 (two) times daily.    . Menthol, Topical Analgesic, (BIOFREEZE EX) Apply 1 application topically 4 (four) times daily as needed (knee pain).    . methocarbamol (ROBAXIN) 500 MG tablet Take 500 mg by mouth 3 (three) times daily. For spasms    . Multiple Vitamins-Minerals (ADULT GUMMY) CHEW Chew 1 tablet by mouth 2 (two) times daily.    . sodium hypochlorite (DAKIN'S 1/4 STRENGTH) 0.125 % SOLN Irrigate with  as directed daily. 473 mL 0  . vancomycin IVPB Inject 1,000 mg into the vein daily. Indication: Sacral osteomyelitis  Last Day of Therapy:  03/11/2019 Labs - Sunday/Monday:  CBC/D, BMP, and vancomycin trough. Labs - Thursday:  BMP and vancomycin trough Labs - Every other week:  ESR and CRP 24 Units 0   No facility-administered medications prior to visit.     Past Medical History:  Diagnosis Date  . Arthritis   . MS (multiple sclerosis) ([place])       Past Surgical History:  Procedure Laterality Date  . NO PAST SURGERIES     . ORIF ANKLE FRACTURE Left 03/30/2013   Procedure: OPEN REDUCTION INTERNAL FIXATION (ORIF) LEFT ANKLE FRACTURE;  Surgeon: Wylene Simmer, MD;  Location: [facility];  Service: Orthopedics;  Laterality: Left;      No family history on file.    Social History   Socioeconomic History  . Marital status: Single    Spouse name: Not on file  . Number of children: Not on file  . Years of education: Not on file  . Highest education level: Not on file  Occupational History  . Not on file  Tobacco Use  . Smoking status: Former Smoker    Packs/day: 2.00    Years: 20.00    Pack years: 40.00    Quit date: 07/03/2008    Years since quitting: 10.6  . Smokeless tobacco: Current User  . Tobacco comment: USES ELECTRONIC CIGARETTES  Substance and Sexual Activity  . Alcohol use: No  . Drug use: No  . Sexual activity: Not on file  Other Topics Concern  . Not on file  Social History Narrative  . Not on file   Social Determinants of Health   Financial Resource Strain:   . Difficulty of Paying Living Expenses: Not on file  Food Insecurity:   . Worried About Charity fundraiser in the Last Year: Not on file  . Ran Out of Food in the Last Year: Not on file  Transportation Needs:   . Lack of Transportation (Medical): Not on file  . Lack of Transportation (Non-Medical): Not on file  Physical Activity:   . Days of Exercise per Week: Not on file  . Minutes of Exercise per Session: Not on file  Stress:   . Feeling of Stress : Not on file  Social Connections:   . Frequency of Communication with Friends and Family: Not on file  . Frequency of Social Gatherings with Friends and Family: Not on file  . Attends Religious Services: Not on file  . Active Member of Clubs or Organizations: Not on file  . Attends Archivist Meetings: Not on file  . Marital Status: Not on file  Intimate Partner Violence:   . Fear of Current or Ex-Partner: Not on file  . Emotionally Abused: Not on file  .  Physically Abused: Not on file  . Sexually Abused: Not on file      Review of Systems  Constitutional: Negative for chills, diaphoresis, fatigue and fever.  Respiratory: Negative for cough, chest tightness, shortness of breath and wheezing.   Cardiovascular: Negative for chest pain.  Gastrointestinal: Negative for abdominal pain, diarrhea, nausea and vomiting.       Objective:    There were no vitals taken for this visit. Nursing note and vital signs reviewed.  Physical Exam Constitutional:      General: She is not in acute distress.    Appearance: She is well-developed.  She is obese.     Comments: Lying in bed with head of bed elevated; pleasant  Neurological:     Mental Status: She is alert and oriented to person, place, and time.  Psychiatric:        Mood and Affect: Mood normal.    Physical exam limited due to telemedicine visit.     Assessment & Plan:   Patient Active Problem List   Diagnosis Date Noted  . Sacral osteomyelitis ([place]) 01/28/2019  . Stage 4 decubitus ulcer ([place]) 01/25/2019  . Cellulitis 01/25/2019  . Decubitus skin ulcer 01/25/2019  . Anxiety 05/13/2013  . Essential hypertension, benign 04/25/2013  . Encounter for long-term (current) use of other medications 04/25/2013  . Constipation 04/09/2013  . Displaced bimalleolar fracture of left ankle 03/30/2013  . Multiple sclerosis ([place]) 08/25/2012  . Chest pain 08/25/2012  . Panic attack 08/25/2012     Problem List Items Addressed This Visit      Musculoskeletal and Integument   Sacral osteomyelitis [facility])    [name] continues to receive treatment with ertapenem and vancomycin for culture-negative sacral osteomyelitis.  Inflammatory markers are slightly improved with most recent CRP of 10 and sedimentation rate of 57.  Lab work set to be drawn tomorrow.  She has approximately 1 week left remaining in treatment and will determine if additional antibiotics are necessary pending blood work results  from tomorrow.  Discussed the plan of care to include continue site offloading and adequate nutrition for wound healing.  Wound care will continue per wound care providers.  Plan for follow-up pending blood work results.          I am having [name] maintain her methocarbamol, gabapentin, baclofen, meloxicam, acetaminophen, ibuprofen, alum & mag hydroxide-simeth, Adult Gummy, (Menthol, Topical Analgesic, (BIOFREEZE EX)), cefTRIAXone, vancomycin, carvedilol, feeding supplement (ENSURE ENLIVE), collagenase, and sodium hypochlorite.   Follow-up: Pending blood work results   Terri Piedra, MSN, [place] for [place] Office phone: [phone] Pager: [phone] [facility] Main number: [phone]

## 2019-03-03 NOTE — Assessment & Plan Note (Signed)
[name] continues to receive treatment with ertapenem and vancomycin for culture-negative sacral osteomyelitis.  Inflammatory markers are slightly improved with most recent CRP of 10 and sedimentation rate of 57.  Lab work set to be drawn tomorrow.  She has approximately 1 week left remaining in treatment and will determine if additional antibiotics are necessary pending blood work results from tomorrow.  Discussed the plan of care to include continue site offloading and adequate nutrition for wound healing.  Wound care will continue per wound care providers.  Plan for follow-up pending blood work results.

## 2019-03-04 DIAGNOSIS — L89154 Pressure ulcer of sacral region, stage 4: Secondary | ICD-10-CM | POA: Diagnosis not present

## 2019-03-04 DIAGNOSIS — G35 Multiple sclerosis: Secondary | ICD-10-CM | POA: Diagnosis not present

## 2019-03-04 DIAGNOSIS — L03312 Cellulitis of back [any part except buttock]: Secondary | ICD-10-CM | POA: Diagnosis not present

## 2019-03-04 DIAGNOSIS — N959 Unspecified menopausal and perimenopausal disorder: Secondary | ICD-10-CM | POA: Diagnosis not present

## 2019-03-04 DIAGNOSIS — G479 Sleep disorder, unspecified: Secondary | ICD-10-CM | POA: Diagnosis not present

## 2019-03-04 DIAGNOSIS — L89324 Pressure ulcer of left buttock, stage 4: Secondary | ICD-10-CM | POA: Diagnosis not present

## 2019-03-04 DIAGNOSIS — M4628 Osteomyelitis of vertebra, sacral and sacrococcygeal region: Secondary | ICD-10-CM | POA: Diagnosis not present

## 2019-03-04 DIAGNOSIS — G8220 Paraplegia, unspecified: Secondary | ICD-10-CM | POA: Diagnosis not present

## 2019-03-04 DIAGNOSIS — I10 Essential (primary) hypertension: Secondary | ICD-10-CM | POA: Diagnosis not present

## 2019-03-05 DIAGNOSIS — L8994 Pressure ulcer of unspecified site, stage 4: Secondary | ICD-10-CM | POA: Diagnosis not present

## 2019-03-05 DIAGNOSIS — M4628 Osteomyelitis of vertebra, sacral and sacrococcygeal region: Secondary | ICD-10-CM | POA: Diagnosis not present

## 2019-03-08 DIAGNOSIS — L89324 Pressure ulcer of left buttock, stage 4: Secondary | ICD-10-CM | POA: Diagnosis not present

## 2019-03-08 DIAGNOSIS — L03312 Cellulitis of back [any part except buttock]: Secondary | ICD-10-CM | POA: Diagnosis not present

## 2019-03-08 DIAGNOSIS — G35 Multiple sclerosis: Secondary | ICD-10-CM | POA: Diagnosis not present

## 2019-03-08 DIAGNOSIS — M4628 Osteomyelitis of vertebra, sacral and sacrococcygeal region: Secondary | ICD-10-CM | POA: Diagnosis not present

## 2019-03-08 DIAGNOSIS — G479 Sleep disorder, unspecified: Secondary | ICD-10-CM | POA: Diagnosis not present

## 2019-03-08 DIAGNOSIS — G8220 Paraplegia, unspecified: Secondary | ICD-10-CM | POA: Diagnosis not present

## 2019-03-08 DIAGNOSIS — L89154 Pressure ulcer of sacral region, stage 4: Secondary | ICD-10-CM | POA: Diagnosis not present

## 2019-03-08 DIAGNOSIS — I10 Essential (primary) hypertension: Secondary | ICD-10-CM | POA: Diagnosis not present

## 2019-03-08 DIAGNOSIS — N959 Unspecified menopausal and perimenopausal disorder: Secondary | ICD-10-CM | POA: Diagnosis not present

## 2019-03-10 ENCOUNTER — Telehealth: Payer: Self-pay

## 2019-03-10 NOTE — Telephone Encounter (Signed)
Written order  By Terri Piedra, NP faxed to [facility] at [phone] 1) Continue Vancomycin and ertapenum 2) pull picc at completions of treatment 3) Continue picc care until removed  [facility]

## 2019-03-11 DIAGNOSIS — M4628 Osteomyelitis of vertebra, sacral and sacrococcygeal region: Secondary | ICD-10-CM | POA: Diagnosis not present

## 2019-03-11 DIAGNOSIS — L89324 Pressure ulcer of left buttock, stage 4: Secondary | ICD-10-CM | POA: Diagnosis not present

## 2019-03-11 DIAGNOSIS — L03312 Cellulitis of back [any part except buttock]: Secondary | ICD-10-CM | POA: Diagnosis not present

## 2019-03-11 DIAGNOSIS — G479 Sleep disorder, unspecified: Secondary | ICD-10-CM | POA: Diagnosis not present

## 2019-03-11 DIAGNOSIS — G35 Multiple sclerosis: Secondary | ICD-10-CM | POA: Diagnosis not present

## 2019-03-11 DIAGNOSIS — I10 Essential (primary) hypertension: Secondary | ICD-10-CM | POA: Diagnosis not present

## 2019-03-11 DIAGNOSIS — N959 Unspecified menopausal and perimenopausal disorder: Secondary | ICD-10-CM | POA: Diagnosis not present

## 2019-03-11 DIAGNOSIS — G8220 Paraplegia, unspecified: Secondary | ICD-10-CM | POA: Diagnosis not present

## 2019-03-11 DIAGNOSIS — L89154 Pressure ulcer of sacral region, stage 4: Secondary | ICD-10-CM | POA: Diagnosis not present

## 2019-03-12 DIAGNOSIS — M4628 Osteomyelitis of vertebra, sacral and sacrococcygeal region: Secondary | ICD-10-CM | POA: Diagnosis not present

## 2019-03-12 DIAGNOSIS — L8994 Pressure ulcer of unspecified site, stage 4: Secondary | ICD-10-CM | POA: Diagnosis not present

## 2019-03-15 DIAGNOSIS — L89324 Pressure ulcer of left buttock, stage 4: Secondary | ICD-10-CM | POA: Diagnosis not present

## 2019-03-15 DIAGNOSIS — L89154 Pressure ulcer of sacral region, stage 4: Secondary | ICD-10-CM | POA: Diagnosis not present

## 2019-03-15 DIAGNOSIS — G8220 Paraplegia, unspecified: Secondary | ICD-10-CM | POA: Diagnosis not present

## 2019-03-15 DIAGNOSIS — L03312 Cellulitis of back [any part except buttock]: Secondary | ICD-10-CM | POA: Diagnosis not present

## 2019-03-15 DIAGNOSIS — G479 Sleep disorder, unspecified: Secondary | ICD-10-CM | POA: Diagnosis not present

## 2019-03-15 DIAGNOSIS — I10 Essential (primary) hypertension: Secondary | ICD-10-CM | POA: Diagnosis not present

## 2019-03-15 DIAGNOSIS — G35 Multiple sclerosis: Secondary | ICD-10-CM | POA: Diagnosis not present

## 2019-03-15 DIAGNOSIS — M4628 Osteomyelitis of vertebra, sacral and sacrococcygeal region: Secondary | ICD-10-CM | POA: Diagnosis not present

## 2019-03-15 DIAGNOSIS — L8994 Pressure ulcer of unspecified site, stage 4: Secondary | ICD-10-CM | POA: Diagnosis not present

## 2019-03-15 DIAGNOSIS — N959 Unspecified menopausal and perimenopausal disorder: Secondary | ICD-10-CM | POA: Diagnosis not present

## 2019-03-15 NOTE — Telephone Encounter (Signed)
Home Health called office to confirm patient's end date. Per FNP patient's end date is 1/9. Home health was able to confirm orders for this. Lorenso Courier, [place]

## 2019-03-18 DIAGNOSIS — L89324 Pressure ulcer of left buttock, stage 4: Secondary | ICD-10-CM | POA: Diagnosis not present

## 2019-03-18 DIAGNOSIS — M4628 Osteomyelitis of vertebra, sacral and sacrococcygeal region: Secondary | ICD-10-CM | POA: Diagnosis not present

## 2019-03-18 DIAGNOSIS — I10 Essential (primary) hypertension: Secondary | ICD-10-CM | POA: Diagnosis not present

## 2019-03-18 DIAGNOSIS — G8220 Paraplegia, unspecified: Secondary | ICD-10-CM | POA: Diagnosis not present

## 2019-03-18 DIAGNOSIS — L89154 Pressure ulcer of sacral region, stage 4: Secondary | ICD-10-CM | POA: Diagnosis not present

## 2019-03-18 DIAGNOSIS — N959 Unspecified menopausal and perimenopausal disorder: Secondary | ICD-10-CM | POA: Diagnosis not present

## 2019-03-18 DIAGNOSIS — G35 Multiple sclerosis: Secondary | ICD-10-CM | POA: Diagnosis not present

## 2019-03-18 DIAGNOSIS — L03312 Cellulitis of back [any part except buttock]: Secondary | ICD-10-CM | POA: Diagnosis not present

## 2019-03-18 DIAGNOSIS — G479 Sleep disorder, unspecified: Secondary | ICD-10-CM | POA: Diagnosis not present

## 2019-03-22 ENCOUNTER — Telehealth: Payer: Self-pay | Admitting: *Deleted

## 2019-03-22 DIAGNOSIS — L03312 Cellulitis of back [any part except buttock]: Secondary | ICD-10-CM | POA: Diagnosis not present

## 2019-03-22 DIAGNOSIS — M4628 Osteomyelitis of vertebra, sacral and sacrococcygeal region: Secondary | ICD-10-CM | POA: Diagnosis not present

## 2019-03-22 DIAGNOSIS — I10 Essential (primary) hypertension: Secondary | ICD-10-CM | POA: Diagnosis not present

## 2019-03-22 DIAGNOSIS — G35 Multiple sclerosis: Secondary | ICD-10-CM | POA: Diagnosis not present

## 2019-03-22 DIAGNOSIS — L89324 Pressure ulcer of left buttock, stage 4: Secondary | ICD-10-CM | POA: Diagnosis not present

## 2019-03-22 DIAGNOSIS — G479 Sleep disorder, unspecified: Secondary | ICD-10-CM | POA: Diagnosis not present

## 2019-03-22 DIAGNOSIS — N959 Unspecified menopausal and perimenopausal disorder: Secondary | ICD-10-CM | POA: Diagnosis not present

## 2019-03-22 DIAGNOSIS — G8220 Paraplegia, unspecified: Secondary | ICD-10-CM | POA: Diagnosis not present

## 2019-03-22 NOTE — Telephone Encounter (Signed)
Will be changing her to oral antibiotics at this point. Ok to proceed with removing PICC line

## 2019-03-22 NOTE — Telephone Encounter (Signed)
Patient's home health nurse called to see if there were continuation orders based on last labs. Please advise, as her IV antibiotics stopped 1/9.  Last note 12/30 states pull PICC order at end of treatment 1/9, but last ESR was 74 on 1/4 per nursing. Landis Gandy, RN

## 2019-03-23 NOTE — Telephone Encounter (Signed)
Confirmed order to pull picc per Tammy Sours.  Please advise on oral medications, as per nurse, patient does not have antibiotics in the home.

## 2019-03-24 MED ORDER — LEVOFLOXACIN 500 MG PO TABS: 500.0000 mg | ORAL_TABLET | Freq: Every day | ORAL | 0 refills | Status: DC

## 2019-03-24 MED ORDER — DOXYCYCLINE HYCLATE 100 MG PO TABS: 100.0000 mg | ORAL_TABLET | Freq: Two times a day (BID) | ORAL | 0 refills | Status: DC

## 2019-03-24 NOTE — Telephone Encounter (Signed)
Oral doxycycline 100 mg bid and levofloxacin 500 mg daily have been sent to the pharmacy.

## 2019-03-24 NOTE — Addendum Note (Signed)
Addended by: Jeanine Luz D on: 03/24/2019 11:49 AM   Modules accepted: Orders

## 2019-03-25 DIAGNOSIS — L03312 Cellulitis of back [any part except buttock]: Secondary | ICD-10-CM | POA: Diagnosis not present

## 2019-03-25 DIAGNOSIS — G8220 Paraplegia, unspecified: Secondary | ICD-10-CM | POA: Diagnosis not present

## 2019-03-25 DIAGNOSIS — M4628 Osteomyelitis of vertebra, sacral and sacrococcygeal region: Secondary | ICD-10-CM | POA: Diagnosis not present

## 2019-03-25 DIAGNOSIS — L89324 Pressure ulcer of left buttock, stage 4: Secondary | ICD-10-CM | POA: Diagnosis not present

## 2019-03-25 DIAGNOSIS — N959 Unspecified menopausal and perimenopausal disorder: Secondary | ICD-10-CM | POA: Diagnosis not present

## 2019-03-25 DIAGNOSIS — G35 Multiple sclerosis: Secondary | ICD-10-CM | POA: Diagnosis not present

## 2019-03-25 DIAGNOSIS — G479 Sleep disorder, unspecified: Secondary | ICD-10-CM | POA: Diagnosis not present

## 2019-03-25 DIAGNOSIS — I10 Essential (primary) hypertension: Secondary | ICD-10-CM | POA: Diagnosis not present

## 2019-03-25 NOTE — Telephone Encounter (Signed)
Confirmed that patient received her antibiotics yesterday. She is now scheduled to follow up (telephone visit) 1/27.  She has access to [facility], but has had issues with mychart video visits.

## 2019-03-31 DIAGNOSIS — I10 Essential (primary) hypertension: Secondary | ICD-10-CM | POA: Diagnosis not present

## 2019-03-31 DIAGNOSIS — N959 Unspecified menopausal and perimenopausal disorder: Secondary | ICD-10-CM | POA: Diagnosis not present

## 2019-03-31 DIAGNOSIS — G35 Multiple sclerosis: Secondary | ICD-10-CM | POA: Diagnosis not present

## 2019-03-31 DIAGNOSIS — M4628 Osteomyelitis of vertebra, sacral and sacrococcygeal region: Secondary | ICD-10-CM | POA: Diagnosis not present

## 2019-03-31 DIAGNOSIS — L89324 Pressure ulcer of left buttock, stage 4: Secondary | ICD-10-CM | POA: Diagnosis not present

## 2019-03-31 DIAGNOSIS — L03312 Cellulitis of back [any part except buttock]: Secondary | ICD-10-CM | POA: Diagnosis not present

## 2019-03-31 DIAGNOSIS — G8220 Paraplegia, unspecified: Secondary | ICD-10-CM | POA: Diagnosis not present

## 2019-03-31 DIAGNOSIS — G479 Sleep disorder, unspecified: Secondary | ICD-10-CM | POA: Diagnosis not present

## 2019-04-01 DIAGNOSIS — M4628 Osteomyelitis of vertebra, sacral and sacrococcygeal region: Secondary | ICD-10-CM | POA: Diagnosis not present

## 2019-04-01 DIAGNOSIS — G35 Multiple sclerosis: Secondary | ICD-10-CM | POA: Diagnosis not present

## 2019-04-01 DIAGNOSIS — L89154 Pressure ulcer of sacral region, stage 4: Secondary | ICD-10-CM | POA: Diagnosis not present

## 2019-04-02 DIAGNOSIS — G8220 Paraplegia, unspecified: Secondary | ICD-10-CM | POA: Diagnosis not present

## 2019-04-02 DIAGNOSIS — G35 Multiple sclerosis: Secondary | ICD-10-CM | POA: Diagnosis not present

## 2019-04-04 DIAGNOSIS — F410 Panic disorder [episodic paroxysmal anxiety] without agoraphobia: Secondary | ICD-10-CM | POA: Diagnosis not present

## 2019-04-04 DIAGNOSIS — G35 Multiple sclerosis: Secondary | ICD-10-CM | POA: Diagnosis not present

## 2019-04-04 DIAGNOSIS — M4628 Osteomyelitis of vertebra, sacral and sacrococcygeal region: Secondary | ICD-10-CM | POA: Diagnosis not present

## 2019-04-04 DIAGNOSIS — L89152 Pressure ulcer of sacral region, stage 2: Secondary | ICD-10-CM | POA: Diagnosis not present

## 2019-04-04 DIAGNOSIS — G479 Sleep disorder, unspecified: Secondary | ICD-10-CM | POA: Diagnosis not present

## 2019-04-04 DIAGNOSIS — I10 Essential (primary) hypertension: Secondary | ICD-10-CM | POA: Diagnosis not present

## 2019-04-04 DIAGNOSIS — L89324 Pressure ulcer of left buttock, stage 4: Secondary | ICD-10-CM | POA: Diagnosis not present

## 2019-04-04 DIAGNOSIS — E538 Deficiency of other specified B group vitamins: Secondary | ICD-10-CM | POA: Diagnosis not present

## 2019-04-07 ENCOUNTER — Ambulatory Visit: Payer: Medicare PPO | Admitting: Family

## 2019-04-07 DIAGNOSIS — G479 Sleep disorder, unspecified: Secondary | ICD-10-CM | POA: Diagnosis not present

## 2019-04-07 DIAGNOSIS — M4628 Osteomyelitis of vertebra, sacral and sacrococcygeal region: Secondary | ICD-10-CM | POA: Diagnosis not present

## 2019-04-07 DIAGNOSIS — E538 Deficiency of other specified B group vitamins: Secondary | ICD-10-CM | POA: Diagnosis not present

## 2019-04-07 DIAGNOSIS — F410 Panic disorder [episodic paroxysmal anxiety] without agoraphobia: Secondary | ICD-10-CM | POA: Diagnosis not present

## 2019-04-07 DIAGNOSIS — G35 Multiple sclerosis: Secondary | ICD-10-CM | POA: Diagnosis not present

## 2019-04-07 DIAGNOSIS — I10 Essential (primary) hypertension: Secondary | ICD-10-CM | POA: Diagnosis not present

## 2019-04-07 DIAGNOSIS — L89324 Pressure ulcer of left buttock, stage 4: Secondary | ICD-10-CM | POA: Diagnosis not present

## 2019-04-07 DIAGNOSIS — L89152 Pressure ulcer of sacral region, stage 2: Secondary | ICD-10-CM | POA: Diagnosis not present

## 2019-04-14 DIAGNOSIS — Z79899 Other long term (current) drug therapy: Secondary | ICD-10-CM | POA: Diagnosis not present

## 2019-04-14 DIAGNOSIS — I10 Essential (primary) hypertension: Secondary | ICD-10-CM | POA: Diagnosis not present

## 2019-04-14 DIAGNOSIS — G35 Multiple sclerosis: Secondary | ICD-10-CM | POA: Diagnosis not present

## 2019-04-15 ENCOUNTER — Other Ambulatory Visit: Payer: Self-pay | Admitting: Family

## 2019-04-15 ENCOUNTER — Telehealth: Payer: Self-pay

## 2019-04-15 DIAGNOSIS — L89324 Pressure ulcer of left buttock, stage 4: Secondary | ICD-10-CM | POA: Diagnosis not present

## 2019-04-15 DIAGNOSIS — G35 Multiple sclerosis: Secondary | ICD-10-CM | POA: Diagnosis not present

## 2019-04-15 DIAGNOSIS — G479 Sleep disorder, unspecified: Secondary | ICD-10-CM | POA: Diagnosis not present

## 2019-04-15 DIAGNOSIS — F410 Panic disorder [episodic paroxysmal anxiety] without agoraphobia: Secondary | ICD-10-CM | POA: Diagnosis not present

## 2019-04-15 DIAGNOSIS — L89152 Pressure ulcer of sacral region, stage 2: Secondary | ICD-10-CM | POA: Diagnosis not present

## 2019-04-15 DIAGNOSIS — M4628 Osteomyelitis of vertebra, sacral and sacrococcygeal region: Secondary | ICD-10-CM | POA: Diagnosis not present

## 2019-04-15 DIAGNOSIS — I10 Essential (primary) hypertension: Secondary | ICD-10-CM | POA: Diagnosis not present

## 2019-04-15 DIAGNOSIS — E538 Deficiency of other specified B group vitamins: Secondary | ICD-10-CM | POA: Diagnosis not present

## 2019-04-15 NOTE — Telephone Encounter (Signed)
Called patient to reschedule f/u appointment evisit for refills

## 2019-04-15 NOTE — Telephone Encounter (Signed)
Spoke with Mother Darel Hong) due to patient being asleep about rescheduling recent missed appointment with Marcos Eke. Darel Hong will have Gracious to call the office to reschedule f/u telephone visit.  Valarie Cones

## 2019-04-16 ENCOUNTER — Other Ambulatory Visit: Payer: Self-pay

## 2019-04-16 ENCOUNTER — Ambulatory Visit (INDEPENDENT_AMBULATORY_CARE_PROVIDER_SITE_OTHER): Payer: Medicare PPO | Admitting: Family

## 2019-04-16 ENCOUNTER — Encounter: Payer: Self-pay | Admitting: Family

## 2019-04-16 DIAGNOSIS — M4628 Osteomyelitis of vertebra, sacral and sacrococcygeal region: Secondary | ICD-10-CM | POA: Diagnosis not present

## 2019-04-16 NOTE — Assessment & Plan Note (Signed)
[name] has continued wound improvement decreasing size now down to 2 cm from 4 cm without evidence of infection. She has tolerated her oral regimen of levofloxacin and doxycyline s/p 8 weeks of IV therapy with vancomycin and ceftriaxone/ertapenem. Recommend continued off loading of site, enhancement of nutrition, and wound care. Will stop antibiotics at the completion of this course. Plan for follow up as needed or for concern for infection.

## 2019-04-16 NOTE — Progress Notes (Signed)
Subjective:    Patient ID: [name], female    DOB: Nov 30, 1970, 49 y.o.   MRN: [id number]  Chief Complaint  Patient presents with  . Follow-up    4 days of antibiotics/  will need refills?     Virtual Visit via Telephone Note   I connected with Ms, [name] on 04/16/2019 at 11:30am by telephone and verified that I am speaking with the correct person using two identifiers.   I discussed the limitations, risks, security and privacy concerns of performing an evaluation and management service by telephone and the availability of in person appointments. I also discussed with the patient that there may be a patient responsible charge related to this service. The patient expressed understanding and agreed to proceed.   HPI:   [name] is a 49 y.o. female with multiple sclerosis complicated by being bedbound and development of sacral decubitus ulcer having developed sacral osteomyelitis. She was initially treated with vancomycin and ceftriaxone and subsequently developed a rash and was changed to vancomycin and ertapenem. She completed 8 weeks of IV therapy and then was changed to levofloxacin and doxycycline with inflammatory markers elevated.   [name] has 4 days of levofloxacin and doxycyline remaining and has tolerated them with no significant adverse side effects or missed doses. Wound size continues to improve and now down to 2 cm fro 4 cm previously. No odor or drainage is present. She continues to off load the site and work on nutrition.    Allergies  Allergen Reactions  . Tizanidine Diarrhea  . Tomato       Outpatient Medications Prior to Visit  Medication Sig Dispense Refill  . acetaminophen (TYLENOL) 500 MG tablet Take 1,000 mg by mouth every 6 (six) hours.    Marland Kitchen alum & mag hydroxide-simeth (MAALOX/MYLANTA) 200-200-20 MG/5ML suspension Take 15 mLs by mouth every 6 (six) hours as needed for indigestion or heartburn.    . baclofen (LIORESAL) 10 MG tablet  Take 10 mg by mouth 3 (three) times daily.    . collagenase (SANTYL) ointment Apply topically daily. 30 g 0  . doxycycline (VIBRA-TABS) 100 MG tablet Take 1 tablet (100 mg total) by mouth 2 (two) times daily. 60 tablet 0  . feeding supplement, ENSURE ENLIVE, (ENSURE ENLIVE) LIQD Take 237 mLs by mouth 2 (two) times daily between meals. 237 mL 12  . gabapentin (NEURONTIN) 300 MG capsule Take 300 mg by mouth See admin instructions. Take one capsule (300 mg) by mouth four times daily, may take an extra capsule (300 mg) at night as needed for pain    . ibuprofen (ADVIL) 200 MG tablet Take 600 mg by mouth every 6 (six) hours as needed (knee pain).    Marland Kitchen levofloxacin (LEVAQUIN) 500 MG tablet Take 1 tablet (500 mg total) by mouth daily. 30 tablet 0  . meloxicam (MOBIC) 7.5 MG tablet Take 7.5 mg by mouth 2 (two) times daily.    . Menthol, Topical Analgesic, (BIOFREEZE EX) Apply 1 application topically 4 (four) times daily as needed (knee pain).    . methocarbamol (ROBAXIN) 500 MG tablet Take 500 mg by mouth 3 (three) times daily. For spasms    . Multiple Vitamins-Minerals (ADULT GUMMY) CHEW Chew 1 tablet by mouth 2 (two) times daily.    . carvedilol (COREG) 3.125 MG tablet Take 1 tablet (3.125 mg total) by mouth 2 (two) times daily with a meal. 60 tablet 0   No facility-administered medications prior to visit.  Past Medical History:  Diagnosis Date  . Arthritis   . MS (multiple sclerosis) ([place])      Past Surgical History:  Procedure Laterality Date  . NO PAST SURGERIES    . ORIF ANKLE FRACTURE Left 03/30/2013   Procedure: OPEN REDUCTION INTERNAL FIXATION (ORIF) LEFT ANKLE FRACTURE;  Surgeon: Wylene Simmer, MD;  Location: [facility];  Service: Orthopedics;  Laterality: Left;       Review of Systems  Constitutional: Negative for chills, diaphoresis, fatigue and fever.  Respiratory: Negative for cough, chest tightness, shortness of breath and wheezing.   Cardiovascular: Negative for chest pain.   Gastrointestinal: Negative for abdominal pain, diarrhea, nausea and vomiting.  Skin: Positive for wound.      Objective:    Nursing note and vital signs reviewed.    [name] is pleasant to speak with and sounds to be doing well.  Assessment & Plan:   Problem List Items Addressed This Visit      Musculoskeletal and Integument   Sacral osteomyelitis ([place])    [name] has continued wound improvement decreasing size now down to 2 cm from 4 cm without evidence of infection. She has tolerated her oral regimen of levofloxacin and doxycyline s/p 8 weeks of IV therapy with vancomycin and ceftriaxone/ertapenem. Recommend continued off loading of site, enhancement of nutrition, and wound care. Will stop antibiotics at the completion of this course. Plan for follow up as needed or for concern for infection.           I am having [name] maintain her methocarbamol, gabapentin, baclofen, meloxicam, acetaminophen, ibuprofen, alum & mag hydroxide-simeth, Adult Gummy, (Menthol, Topical Analgesic, (BIOFREEZE EX)), carvedilol, feeding supplement (ENSURE ENLIVE), collagenase, doxycycline, and levofloxacin.   I discussed the assessment and treatment plan with the patient. The patient was provided an opportunity to ask questions and all were answered. The patient agreed with the plan and demonstrated an understanding of the instructions.   The patient was advised to call back or seek an in-person evaluation if the symptoms worsen or if the condition fails to improve as anticipated.   I provided 11   minutes of non-face-to-face time during this encounter.  Follow-up: As needed,   Terri Piedra, MSN, FNP-C Nurse Practitioner [facility] number: [phone]

## 2019-04-16 NOTE — Patient Instructions (Signed)
Nice to speak with you.  Please complete your current course of antibiotics and stop.  Continue with off loading of site, wound care and enhancing nutrition for healing.  Follow up with ID as needed.

## 2019-04-22 DIAGNOSIS — I10 Essential (primary) hypertension: Secondary | ICD-10-CM | POA: Diagnosis not present

## 2019-04-22 DIAGNOSIS — L89152 Pressure ulcer of sacral region, stage 2: Secondary | ICD-10-CM | POA: Diagnosis not present

## 2019-04-22 DIAGNOSIS — G35 Multiple sclerosis: Secondary | ICD-10-CM | POA: Diagnosis not present

## 2019-04-22 DIAGNOSIS — E538 Deficiency of other specified B group vitamins: Secondary | ICD-10-CM | POA: Diagnosis not present

## 2019-04-22 DIAGNOSIS — M4628 Osteomyelitis of vertebra, sacral and sacrococcygeal region: Secondary | ICD-10-CM | POA: Diagnosis not present

## 2019-04-22 DIAGNOSIS — F410 Panic disorder [episodic paroxysmal anxiety] without agoraphobia: Secondary | ICD-10-CM | POA: Diagnosis not present

## 2019-04-22 DIAGNOSIS — L89324 Pressure ulcer of left buttock, stage 4: Secondary | ICD-10-CM | POA: Diagnosis not present

## 2019-04-22 DIAGNOSIS — G479 Sleep disorder, unspecified: Secondary | ICD-10-CM | POA: Diagnosis not present

## 2019-04-28 DIAGNOSIS — L89152 Pressure ulcer of sacral region, stage 2: Secondary | ICD-10-CM | POA: Diagnosis not present

## 2019-04-28 DIAGNOSIS — L89324 Pressure ulcer of left buttock, stage 4: Secondary | ICD-10-CM | POA: Diagnosis not present

## 2019-04-28 DIAGNOSIS — M4628 Osteomyelitis of vertebra, sacral and sacrococcygeal region: Secondary | ICD-10-CM | POA: Diagnosis not present

## 2019-04-28 DIAGNOSIS — G35 Multiple sclerosis: Secondary | ICD-10-CM | POA: Diagnosis not present

## 2019-04-28 DIAGNOSIS — I10 Essential (primary) hypertension: Secondary | ICD-10-CM | POA: Diagnosis not present

## 2019-04-28 DIAGNOSIS — G479 Sleep disorder, unspecified: Secondary | ICD-10-CM | POA: Diagnosis not present

## 2019-04-28 DIAGNOSIS — E538 Deficiency of other specified B group vitamins: Secondary | ICD-10-CM | POA: Diagnosis not present

## 2019-04-28 DIAGNOSIS — F410 Panic disorder [episodic paroxysmal anxiety] without agoraphobia: Secondary | ICD-10-CM | POA: Diagnosis not present

## 2019-05-02 DIAGNOSIS — G35 Multiple sclerosis: Secondary | ICD-10-CM | POA: Diagnosis not present

## 2019-05-02 DIAGNOSIS — L89154 Pressure ulcer of sacral region, stage 4: Secondary | ICD-10-CM | POA: Diagnosis not present

## 2019-05-02 DIAGNOSIS — M4628 Osteomyelitis of vertebra, sacral and sacrococcygeal region: Secondary | ICD-10-CM | POA: Diagnosis not present

## 2019-05-03 DIAGNOSIS — G8220 Paraplegia, unspecified: Secondary | ICD-10-CM | POA: Diagnosis not present

## 2019-05-03 DIAGNOSIS — G35 Multiple sclerosis: Secondary | ICD-10-CM | POA: Diagnosis not present

## 2019-05-04 DIAGNOSIS — L89324 Pressure ulcer of left buttock, stage 4: Secondary | ICD-10-CM | POA: Diagnosis not present

## 2019-05-04 DIAGNOSIS — F410 Panic disorder [episodic paroxysmal anxiety] without agoraphobia: Secondary | ICD-10-CM | POA: Diagnosis not present

## 2019-05-04 DIAGNOSIS — E538 Deficiency of other specified B group vitamins: Secondary | ICD-10-CM | POA: Diagnosis not present

## 2019-05-04 DIAGNOSIS — L89152 Pressure ulcer of sacral region, stage 2: Secondary | ICD-10-CM | POA: Diagnosis not present

## 2019-05-04 DIAGNOSIS — I10 Essential (primary) hypertension: Secondary | ICD-10-CM | POA: Diagnosis not present

## 2019-05-04 DIAGNOSIS — G35 Multiple sclerosis: Secondary | ICD-10-CM | POA: Diagnosis not present

## 2019-05-04 DIAGNOSIS — M4628 Osteomyelitis of vertebra, sacral and sacrococcygeal region: Secondary | ICD-10-CM | POA: Diagnosis not present

## 2019-05-04 DIAGNOSIS — G479 Sleep disorder, unspecified: Secondary | ICD-10-CM | POA: Diagnosis not present

## 2019-05-05 DIAGNOSIS — E538 Deficiency of other specified B group vitamins: Secondary | ICD-10-CM | POA: Diagnosis not present

## 2019-05-05 DIAGNOSIS — L89324 Pressure ulcer of left buttock, stage 4: Secondary | ICD-10-CM | POA: Diagnosis not present

## 2019-05-05 DIAGNOSIS — F410 Panic disorder [episodic paroxysmal anxiety] without agoraphobia: Secondary | ICD-10-CM | POA: Diagnosis not present

## 2019-05-05 DIAGNOSIS — I10 Essential (primary) hypertension: Secondary | ICD-10-CM | POA: Diagnosis not present

## 2019-05-05 DIAGNOSIS — G479 Sleep disorder, unspecified: Secondary | ICD-10-CM | POA: Diagnosis not present

## 2019-05-05 DIAGNOSIS — M4628 Osteomyelitis of vertebra, sacral and sacrococcygeal region: Secondary | ICD-10-CM | POA: Diagnosis not present

## 2019-05-05 DIAGNOSIS — L89152 Pressure ulcer of sacral region, stage 2: Secondary | ICD-10-CM | POA: Diagnosis not present

## 2019-05-05 DIAGNOSIS — G35 Multiple sclerosis: Secondary | ICD-10-CM | POA: Diagnosis not present

## 2019-05-12 DIAGNOSIS — G479 Sleep disorder, unspecified: Secondary | ICD-10-CM | POA: Diagnosis not present

## 2019-05-12 DIAGNOSIS — G35 Multiple sclerosis: Secondary | ICD-10-CM | POA: Diagnosis not present

## 2019-05-12 DIAGNOSIS — L89324 Pressure ulcer of left buttock, stage 4: Secondary | ICD-10-CM | POA: Diagnosis not present

## 2019-05-12 DIAGNOSIS — E538 Deficiency of other specified B group vitamins: Secondary | ICD-10-CM | POA: Diagnosis not present

## 2019-05-12 DIAGNOSIS — L89152 Pressure ulcer of sacral region, stage 2: Secondary | ICD-10-CM | POA: Diagnosis not present

## 2019-05-12 DIAGNOSIS — I10 Essential (primary) hypertension: Secondary | ICD-10-CM | POA: Diagnosis not present

## 2019-05-12 DIAGNOSIS — F410 Panic disorder [episodic paroxysmal anxiety] without agoraphobia: Secondary | ICD-10-CM | POA: Diagnosis not present

## 2019-05-12 DIAGNOSIS — M4628 Osteomyelitis of vertebra, sacral and sacrococcygeal region: Secondary | ICD-10-CM | POA: Diagnosis not present

## 2019-05-20 DIAGNOSIS — I10 Essential (primary) hypertension: Secondary | ICD-10-CM | POA: Diagnosis not present

## 2019-05-20 DIAGNOSIS — G35 Multiple sclerosis: Secondary | ICD-10-CM | POA: Diagnosis not present

## 2019-05-20 DIAGNOSIS — L89152 Pressure ulcer of sacral region, stage 2: Secondary | ICD-10-CM | POA: Diagnosis not present

## 2019-05-20 DIAGNOSIS — G479 Sleep disorder, unspecified: Secondary | ICD-10-CM | POA: Diagnosis not present

## 2019-05-20 DIAGNOSIS — E538 Deficiency of other specified B group vitamins: Secondary | ICD-10-CM | POA: Diagnosis not present

## 2019-05-20 DIAGNOSIS — F410 Panic disorder [episodic paroxysmal anxiety] without agoraphobia: Secondary | ICD-10-CM | POA: Diagnosis not present

## 2019-05-20 DIAGNOSIS — L89324 Pressure ulcer of left buttock, stage 4: Secondary | ICD-10-CM | POA: Diagnosis not present

## 2019-05-20 DIAGNOSIS — M4628 Osteomyelitis of vertebra, sacral and sacrococcygeal region: Secondary | ICD-10-CM | POA: Diagnosis not present

## 2019-05-26 DIAGNOSIS — M4628 Osteomyelitis of vertebra, sacral and sacrococcygeal region: Secondary | ICD-10-CM | POA: Diagnosis not present

## 2019-05-26 DIAGNOSIS — L89152 Pressure ulcer of sacral region, stage 2: Secondary | ICD-10-CM | POA: Diagnosis not present

## 2019-05-26 DIAGNOSIS — I10 Essential (primary) hypertension: Secondary | ICD-10-CM | POA: Diagnosis not present

## 2019-05-26 DIAGNOSIS — E538 Deficiency of other specified B group vitamins: Secondary | ICD-10-CM | POA: Diagnosis not present

## 2019-05-26 DIAGNOSIS — L89324 Pressure ulcer of left buttock, stage 4: Secondary | ICD-10-CM | POA: Diagnosis not present

## 2019-05-26 DIAGNOSIS — G479 Sleep disorder, unspecified: Secondary | ICD-10-CM | POA: Diagnosis not present

## 2019-05-26 DIAGNOSIS — F410 Panic disorder [episodic paroxysmal anxiety] without agoraphobia: Secondary | ICD-10-CM | POA: Diagnosis not present

## 2019-05-26 DIAGNOSIS — G35 Multiple sclerosis: Secondary | ICD-10-CM | POA: Diagnosis not present

## 2019-05-30 DIAGNOSIS — L89154 Pressure ulcer of sacral region, stage 4: Secondary | ICD-10-CM | POA: Diagnosis not present

## 2019-05-30 DIAGNOSIS — G35 Multiple sclerosis: Secondary | ICD-10-CM | POA: Diagnosis not present

## 2019-05-30 DIAGNOSIS — M4628 Osteomyelitis of vertebra, sacral and sacrococcygeal region: Secondary | ICD-10-CM | POA: Diagnosis not present

## 2019-05-31 DIAGNOSIS — G8220 Paraplegia, unspecified: Secondary | ICD-10-CM | POA: Diagnosis not present

## 2019-05-31 DIAGNOSIS — G35 Multiple sclerosis: Secondary | ICD-10-CM | POA: Diagnosis not present

## 2019-06-02 DIAGNOSIS — E538 Deficiency of other specified B group vitamins: Secondary | ICD-10-CM | POA: Diagnosis not present

## 2019-06-02 DIAGNOSIS — I10 Essential (primary) hypertension: Secondary | ICD-10-CM | POA: Diagnosis not present

## 2019-06-02 DIAGNOSIS — F410 Panic disorder [episodic paroxysmal anxiety] without agoraphobia: Secondary | ICD-10-CM | POA: Diagnosis not present

## 2019-06-02 DIAGNOSIS — L89152 Pressure ulcer of sacral region, stage 2: Secondary | ICD-10-CM | POA: Diagnosis not present

## 2019-06-02 DIAGNOSIS — M4628 Osteomyelitis of vertebra, sacral and sacrococcygeal region: Secondary | ICD-10-CM | POA: Diagnosis not present

## 2019-06-02 DIAGNOSIS — G479 Sleep disorder, unspecified: Secondary | ICD-10-CM | POA: Diagnosis not present

## 2019-06-02 DIAGNOSIS — L89324 Pressure ulcer of left buttock, stage 4: Secondary | ICD-10-CM | POA: Diagnosis not present

## 2019-06-02 DIAGNOSIS — G35 Multiple sclerosis: Secondary | ICD-10-CM | POA: Diagnosis not present

## 2019-06-03 DIAGNOSIS — F410 Panic disorder [episodic paroxysmal anxiety] without agoraphobia: Secondary | ICD-10-CM | POA: Diagnosis not present

## 2019-06-03 DIAGNOSIS — E538 Deficiency of other specified B group vitamins: Secondary | ICD-10-CM | POA: Diagnosis not present

## 2019-06-03 DIAGNOSIS — G479 Sleep disorder, unspecified: Secondary | ICD-10-CM | POA: Diagnosis not present

## 2019-06-03 DIAGNOSIS — M21372 Foot drop, left foot: Secondary | ICD-10-CM | POA: Diagnosis not present

## 2019-06-03 DIAGNOSIS — L89324 Pressure ulcer of left buttock, stage 4: Secondary | ICD-10-CM | POA: Diagnosis not present

## 2019-06-03 DIAGNOSIS — M21371 Foot drop, right foot: Secondary | ICD-10-CM | POA: Diagnosis not present

## 2019-06-03 DIAGNOSIS — I10 Essential (primary) hypertension: Secondary | ICD-10-CM | POA: Diagnosis not present

## 2019-06-03 DIAGNOSIS — G35 Multiple sclerosis: Secondary | ICD-10-CM | POA: Diagnosis not present

## 2019-06-10 DIAGNOSIS — M21371 Foot drop, right foot: Secondary | ICD-10-CM | POA: Diagnosis not present

## 2019-06-10 DIAGNOSIS — G479 Sleep disorder, unspecified: Secondary | ICD-10-CM | POA: Diagnosis not present

## 2019-06-10 DIAGNOSIS — E538 Deficiency of other specified B group vitamins: Secondary | ICD-10-CM | POA: Diagnosis not present

## 2019-06-10 DIAGNOSIS — F410 Panic disorder [episodic paroxysmal anxiety] without agoraphobia: Secondary | ICD-10-CM | POA: Diagnosis not present

## 2019-06-10 DIAGNOSIS — M21372 Foot drop, left foot: Secondary | ICD-10-CM | POA: Diagnosis not present

## 2019-06-10 DIAGNOSIS — G35 Multiple sclerosis: Secondary | ICD-10-CM | POA: Diagnosis not present

## 2019-06-10 DIAGNOSIS — I10 Essential (primary) hypertension: Secondary | ICD-10-CM | POA: Diagnosis not present

## 2019-06-10 DIAGNOSIS — L89324 Pressure ulcer of left buttock, stage 4: Secondary | ICD-10-CM | POA: Diagnosis not present

## 2019-06-16 DIAGNOSIS — G35 Multiple sclerosis: Secondary | ICD-10-CM | POA: Diagnosis not present

## 2019-06-16 DIAGNOSIS — F410 Panic disorder [episodic paroxysmal anxiety] without agoraphobia: Secondary | ICD-10-CM | POA: Diagnosis not present

## 2019-06-16 DIAGNOSIS — L89324 Pressure ulcer of left buttock, stage 4: Secondary | ICD-10-CM | POA: Diagnosis not present

## 2019-06-16 DIAGNOSIS — M21371 Foot drop, right foot: Secondary | ICD-10-CM | POA: Diagnosis not present

## 2019-06-16 DIAGNOSIS — E538 Deficiency of other specified B group vitamins: Secondary | ICD-10-CM | POA: Diagnosis not present

## 2019-06-16 DIAGNOSIS — M21372 Foot drop, left foot: Secondary | ICD-10-CM | POA: Diagnosis not present

## 2019-06-16 DIAGNOSIS — G479 Sleep disorder, unspecified: Secondary | ICD-10-CM | POA: Diagnosis not present

## 2019-06-16 DIAGNOSIS — I10 Essential (primary) hypertension: Secondary | ICD-10-CM | POA: Diagnosis not present

## 2019-06-24 DIAGNOSIS — F410 Panic disorder [episodic paroxysmal anxiety] without agoraphobia: Secondary | ICD-10-CM | POA: Diagnosis not present

## 2019-06-24 DIAGNOSIS — G35 Multiple sclerosis: Secondary | ICD-10-CM | POA: Diagnosis not present

## 2019-06-24 DIAGNOSIS — M21371 Foot drop, right foot: Secondary | ICD-10-CM | POA: Diagnosis not present

## 2019-06-24 DIAGNOSIS — M21372 Foot drop, left foot: Secondary | ICD-10-CM | POA: Diagnosis not present

## 2019-06-24 DIAGNOSIS — G479 Sleep disorder, unspecified: Secondary | ICD-10-CM | POA: Diagnosis not present

## 2019-06-24 DIAGNOSIS — I10 Essential (primary) hypertension: Secondary | ICD-10-CM | POA: Diagnosis not present

## 2019-06-24 DIAGNOSIS — L89324 Pressure ulcer of left buttock, stage 4: Secondary | ICD-10-CM | POA: Diagnosis not present

## 2019-06-24 DIAGNOSIS — E538 Deficiency of other specified B group vitamins: Secondary | ICD-10-CM | POA: Diagnosis not present

## 2019-06-30 DIAGNOSIS — F410 Panic disorder [episodic paroxysmal anxiety] without agoraphobia: Secondary | ICD-10-CM | POA: Diagnosis not present

## 2019-06-30 DIAGNOSIS — G35 Multiple sclerosis: Secondary | ICD-10-CM | POA: Diagnosis not present

## 2019-06-30 DIAGNOSIS — M4628 Osteomyelitis of vertebra, sacral and sacrococcygeal region: Secondary | ICD-10-CM | POA: Diagnosis not present

## 2019-06-30 DIAGNOSIS — I10 Essential (primary) hypertension: Secondary | ICD-10-CM | POA: Diagnosis not present

## 2019-06-30 DIAGNOSIS — M21371 Foot drop, right foot: Secondary | ICD-10-CM | POA: Diagnosis not present

## 2019-06-30 DIAGNOSIS — L89154 Pressure ulcer of sacral region, stage 4: Secondary | ICD-10-CM | POA: Diagnosis not present

## 2019-06-30 DIAGNOSIS — L89324 Pressure ulcer of left buttock, stage 4: Secondary | ICD-10-CM | POA: Diagnosis not present

## 2019-06-30 DIAGNOSIS — M21372 Foot drop, left foot: Secondary | ICD-10-CM | POA: Diagnosis not present

## 2019-06-30 DIAGNOSIS — G479 Sleep disorder, unspecified: Secondary | ICD-10-CM | POA: Diagnosis not present

## 2019-06-30 DIAGNOSIS — E538 Deficiency of other specified B group vitamins: Secondary | ICD-10-CM | POA: Diagnosis not present

## 2019-07-01 DIAGNOSIS — G35 Multiple sclerosis: Secondary | ICD-10-CM | POA: Diagnosis not present

## 2019-07-01 DIAGNOSIS — G8220 Paraplegia, unspecified: Secondary | ICD-10-CM | POA: Diagnosis not present

## 2019-07-03 DIAGNOSIS — F410 Panic disorder [episodic paroxysmal anxiety] without agoraphobia: Secondary | ICD-10-CM | POA: Diagnosis not present

## 2019-07-03 DIAGNOSIS — I10 Essential (primary) hypertension: Secondary | ICD-10-CM | POA: Diagnosis not present

## 2019-07-03 DIAGNOSIS — E538 Deficiency of other specified B group vitamins: Secondary | ICD-10-CM | POA: Diagnosis not present

## 2019-07-03 DIAGNOSIS — L89324 Pressure ulcer of left buttock, stage 4: Secondary | ICD-10-CM | POA: Diagnosis not present

## 2019-07-03 DIAGNOSIS — G35 Multiple sclerosis: Secondary | ICD-10-CM | POA: Diagnosis not present

## 2019-07-03 DIAGNOSIS — M21371 Foot drop, right foot: Secondary | ICD-10-CM | POA: Diagnosis not present

## 2019-07-03 DIAGNOSIS — G479 Sleep disorder, unspecified: Secondary | ICD-10-CM | POA: Diagnosis not present

## 2019-07-03 DIAGNOSIS — M21372 Foot drop, left foot: Secondary | ICD-10-CM | POA: Diagnosis not present

## 2019-07-07 DIAGNOSIS — G479 Sleep disorder, unspecified: Secondary | ICD-10-CM | POA: Diagnosis not present

## 2019-07-07 DIAGNOSIS — G35 Multiple sclerosis: Secondary | ICD-10-CM | POA: Diagnosis not present

## 2019-07-07 DIAGNOSIS — E538 Deficiency of other specified B group vitamins: Secondary | ICD-10-CM | POA: Diagnosis not present

## 2019-07-07 DIAGNOSIS — M21371 Foot drop, right foot: Secondary | ICD-10-CM | POA: Diagnosis not present

## 2019-07-07 DIAGNOSIS — F410 Panic disorder [episodic paroxysmal anxiety] without agoraphobia: Secondary | ICD-10-CM | POA: Diagnosis not present

## 2019-07-07 DIAGNOSIS — M21372 Foot drop, left foot: Secondary | ICD-10-CM | POA: Diagnosis not present

## 2019-07-07 DIAGNOSIS — L89324 Pressure ulcer of left buttock, stage 4: Secondary | ICD-10-CM | POA: Diagnosis not present

## 2019-07-07 DIAGNOSIS — I10 Essential (primary) hypertension: Secondary | ICD-10-CM | POA: Diagnosis not present

## 2019-07-30 DIAGNOSIS — L89154 Pressure ulcer of sacral region, stage 4: Secondary | ICD-10-CM | POA: Diagnosis not present

## 2019-07-30 DIAGNOSIS — G35 Multiple sclerosis: Secondary | ICD-10-CM | POA: Diagnosis not present

## 2019-07-30 DIAGNOSIS — M4628 Osteomyelitis of vertebra, sacral and sacrococcygeal region: Secondary | ICD-10-CM | POA: Diagnosis not present

## 2019-08-30 DIAGNOSIS — M4628 Osteomyelitis of vertebra, sacral and sacrococcygeal region: Secondary | ICD-10-CM | POA: Diagnosis not present

## 2019-08-30 DIAGNOSIS — L89154 Pressure ulcer of sacral region, stage 4: Secondary | ICD-10-CM | POA: Diagnosis not present

## 2019-08-30 DIAGNOSIS — G35 Multiple sclerosis: Secondary | ICD-10-CM | POA: Diagnosis not present

## 2019-09-29 DIAGNOSIS — G35 Multiple sclerosis: Secondary | ICD-10-CM | POA: Diagnosis not present

## 2019-09-29 DIAGNOSIS — M4628 Osteomyelitis of vertebra, sacral and sacrococcygeal region: Secondary | ICD-10-CM | POA: Diagnosis not present

## 2019-09-29 DIAGNOSIS — L89154 Pressure ulcer of sacral region, stage 4: Secondary | ICD-10-CM | POA: Diagnosis not present

## 2019-10-30 DIAGNOSIS — L89154 Pressure ulcer of sacral region, stage 4: Secondary | ICD-10-CM | POA: Diagnosis not present

## 2019-10-30 DIAGNOSIS — G35 Multiple sclerosis: Secondary | ICD-10-CM | POA: Diagnosis not present

## 2019-10-30 DIAGNOSIS — M4628 Osteomyelitis of vertebra, sacral and sacrococcygeal region: Secondary | ICD-10-CM | POA: Diagnosis not present

## 2019-11-30 DIAGNOSIS — G35 Multiple sclerosis: Secondary | ICD-10-CM | POA: Diagnosis not present

## 2019-11-30 DIAGNOSIS — L89154 Pressure ulcer of sacral region, stage 4: Secondary | ICD-10-CM | POA: Diagnosis not present

## 2019-11-30 DIAGNOSIS — M4628 Osteomyelitis of vertebra, sacral and sacrococcygeal region: Secondary | ICD-10-CM | POA: Diagnosis not present

## 2019-12-30 DIAGNOSIS — M4628 Osteomyelitis of vertebra, sacral and sacrococcygeal region: Secondary | ICD-10-CM | POA: Diagnosis not present

## 2019-12-30 DIAGNOSIS — G35 Multiple sclerosis: Secondary | ICD-10-CM | POA: Diagnosis not present

## 2019-12-30 DIAGNOSIS — L89154 Pressure ulcer of sacral region, stage 4: Secondary | ICD-10-CM | POA: Diagnosis not present

## 2020-02-02 IMAGING — MR MR SACRUM / SI JOINTS WO CM
3 series · 21 of 48 positions shown · non-contrast
Comparison: None.

CLINICAL DATA: Sacral decubitus ulcer. Pain and discharge from the
wound.

EXAM:
MRI SACRUM WITHOUT CONTRAST
TECHNIQUE: Multiplanar, multisequence MR imaging of the sacrum was performed.
No intravenous contrast was administered.
The study is limited to 3 sequences because the patient could not
tolerate further scanning due to pain.

[Series 2: T1 · oblique · 4.0mm · 0.43mm/px · 10 of 28 slices shown]
[im 2/28]
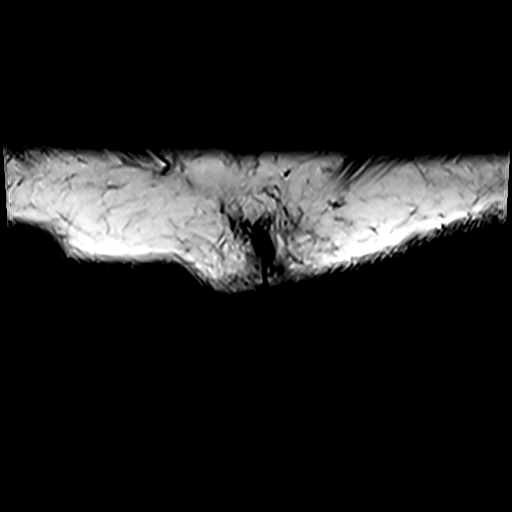
[im 4/28]
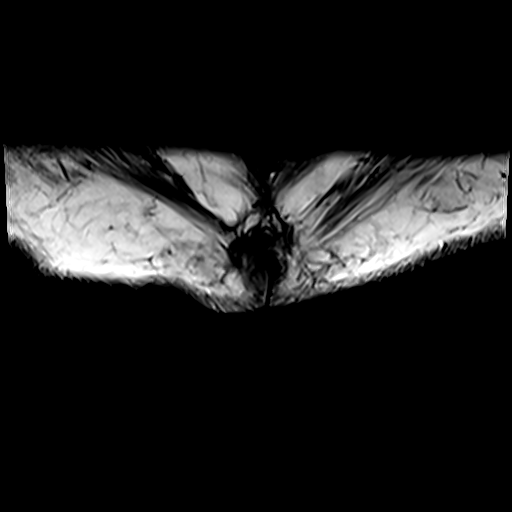
[im 6/28]
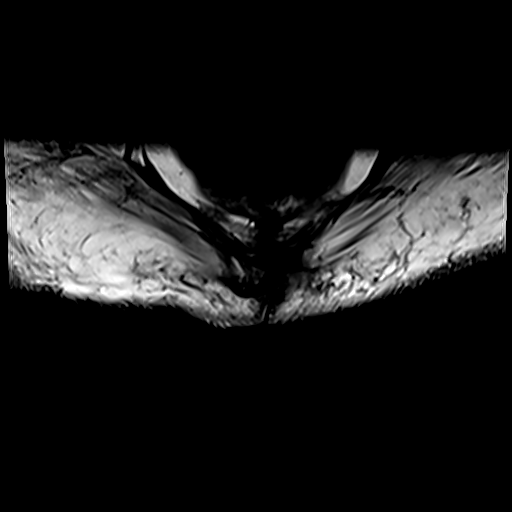
[im 10/28]
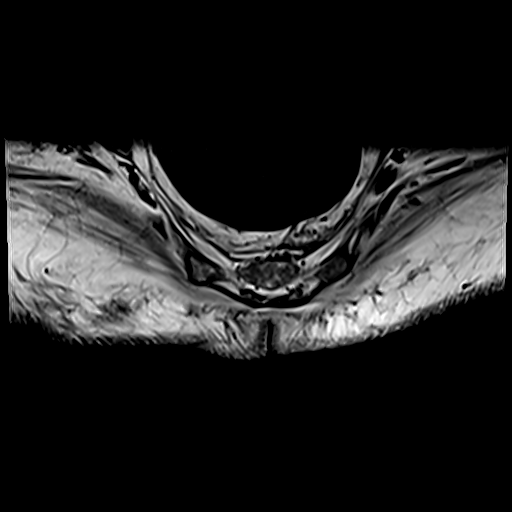
[im 13/28]
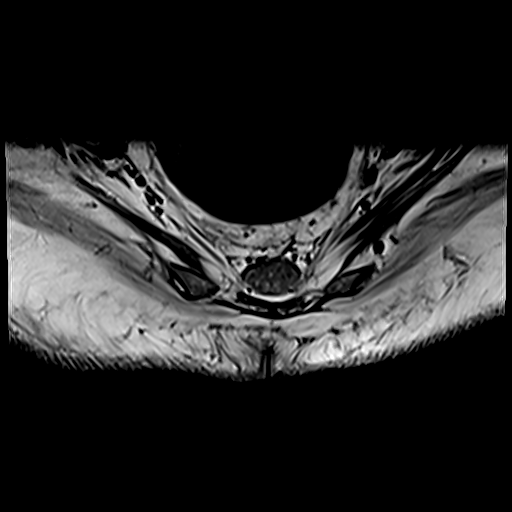
[im 15/28]
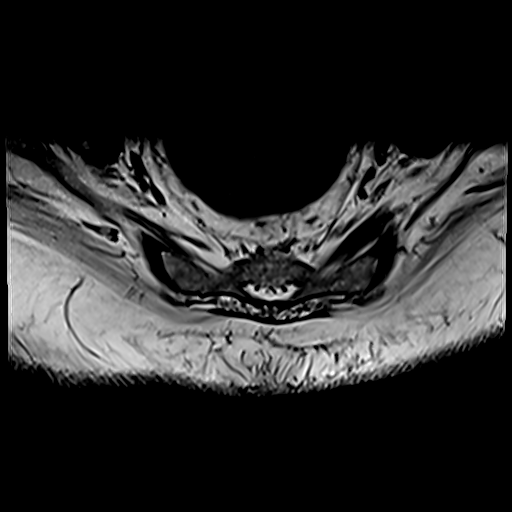
[im 17/28]
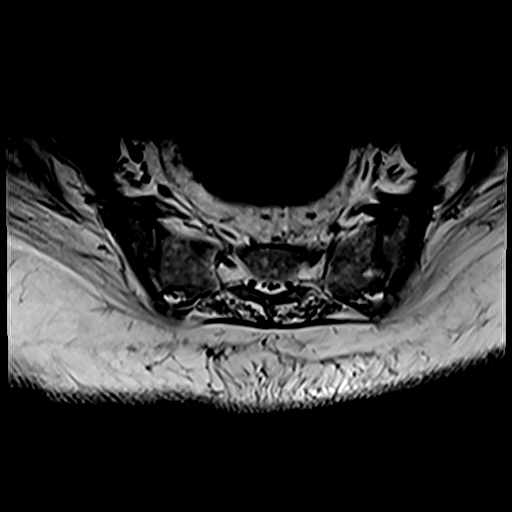
[im 20/28]
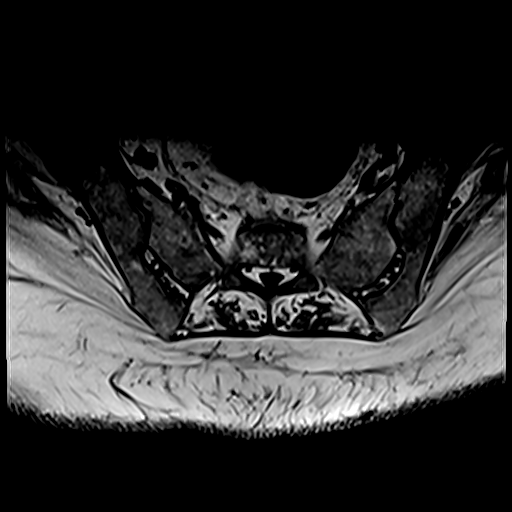
[im 24/28]
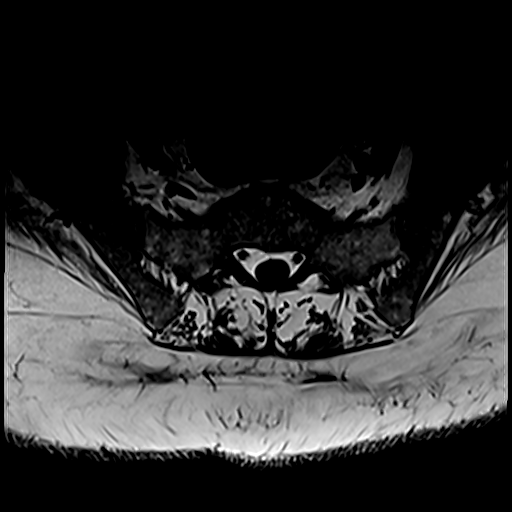
[im 28/28]
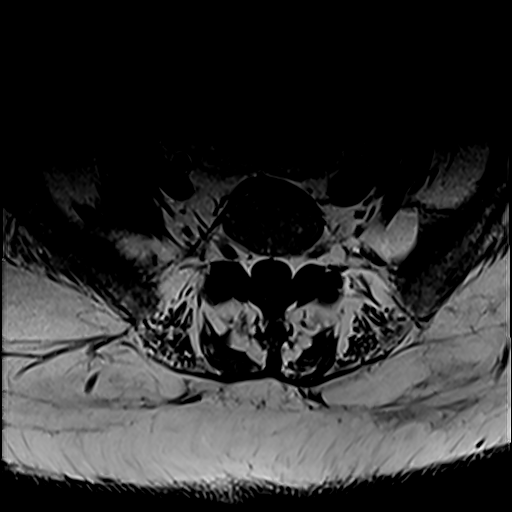

[Series 3: T2 fat-sat · oblique · 4.0mm · 0.57mm/px · 3 of 28 slices shown]
[im 4/28]
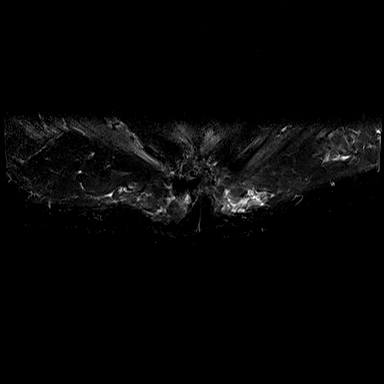
[im 14/28]
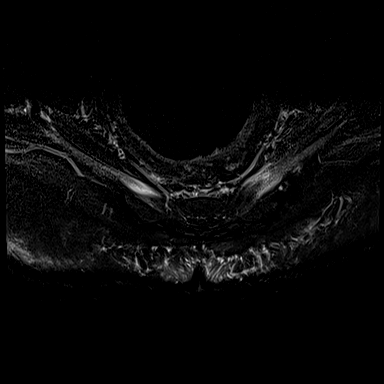
[im 24/28]
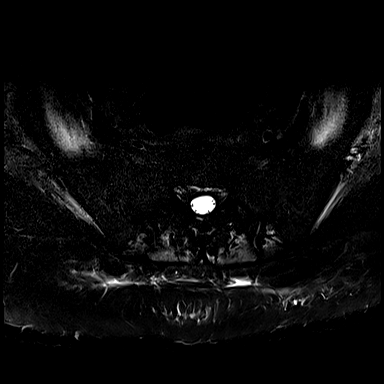

[Series 4: STIR · axial · 3.0mm · 0.39mm/px · z∈[+52,+118]mm · 8 of 25 slices shown]
[im 1/25]
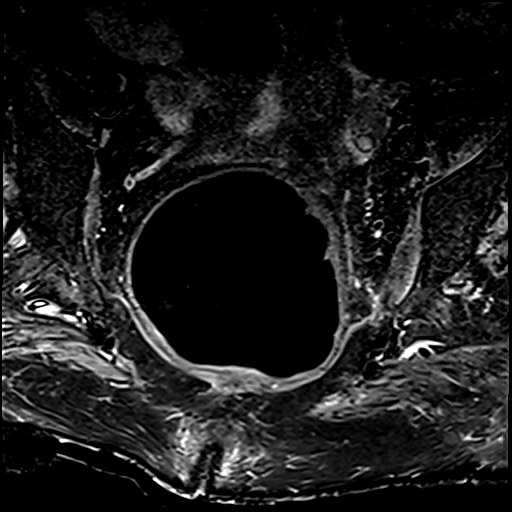
[im 4/25]
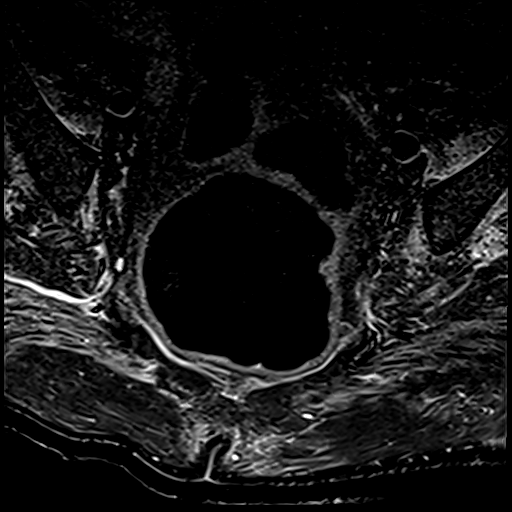
[im 7/25]
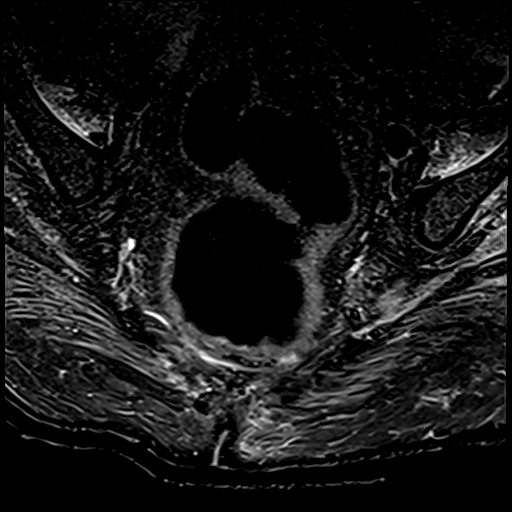
[im 11/25]
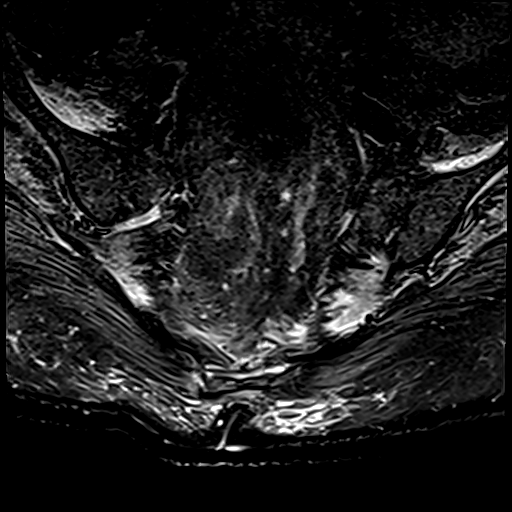
[im 13/25]
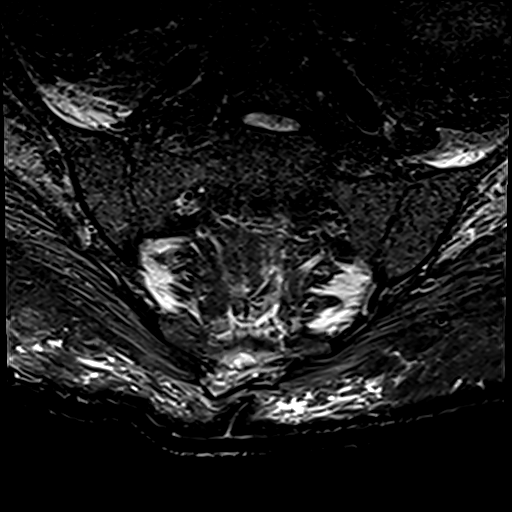
[im 14/25]
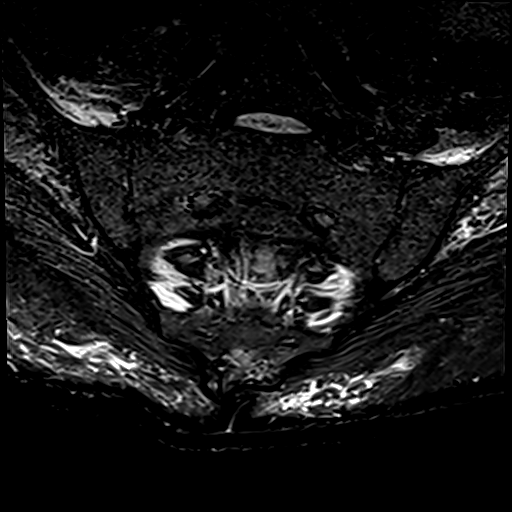
[im 18/25]
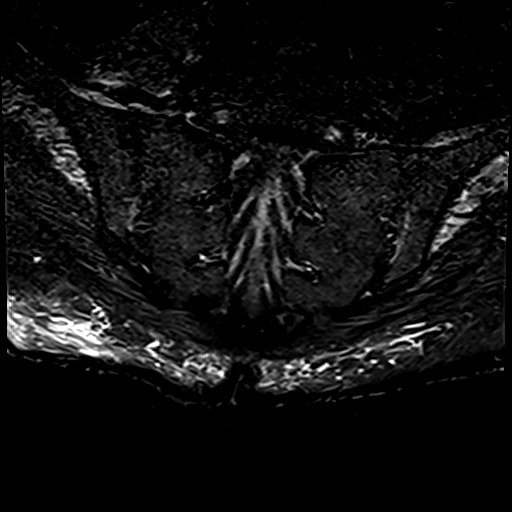
[im 21/25]
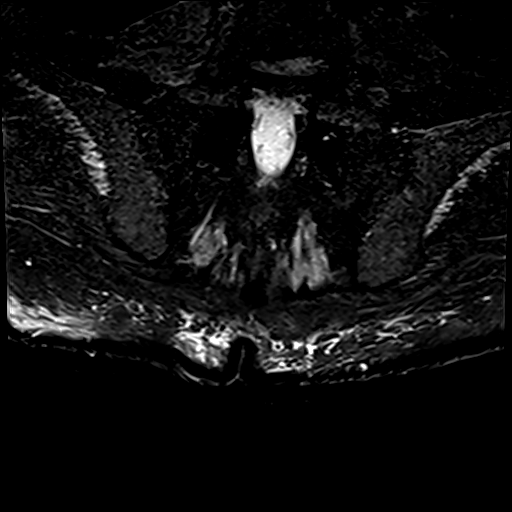

[21 of 48 positions shown; findings below may reference images not displayed]

FINDINGS: There is an area of abnormal T1 and T2 weighted signal from the
distal sacrum just above the coccyx consistent with osteomyelitis.
However, a sacral fracture could have the same appearance.

The reported sacral decubitus ulcer is not appreciable on this exam.
There is no discrete abscess.

There is a large amount of stool in the rectum.

Sacroiliac joints are normal.
IMPRESSION: 1. Possible osteomyelitis of the distal sacrum just above the
coccyx. A sacral fracture could have the same appearance.
2. No discrete abscess or sacral decubitus ulcer visualized.
3. Large amount of stool in the rectum.

## 2020-04-21 DIAGNOSIS — M62830 Muscle spasm of back: Secondary | ICD-10-CM | POA: Diagnosis not present

## 2020-04-21 DIAGNOSIS — G35 Multiple sclerosis: Secondary | ICD-10-CM | POA: Diagnosis not present

## 2020-04-21 DIAGNOSIS — Z79899 Other long term (current) drug therapy: Secondary | ICD-10-CM | POA: Diagnosis not present

## 2020-04-21 DIAGNOSIS — G8220 Paraplegia, unspecified: Secondary | ICD-10-CM | POA: Diagnosis not present

## 2020-04-26 DIAGNOSIS — Z79899 Other long term (current) drug therapy: Secondary | ICD-10-CM | POA: Diagnosis not present

## 2020-04-26 DIAGNOSIS — I10 Essential (primary) hypertension: Secondary | ICD-10-CM | POA: Diagnosis not present

## 2020-04-26 DIAGNOSIS — E559 Vitamin D deficiency, unspecified: Secondary | ICD-10-CM | POA: Diagnosis not present

## 2020-06-26 DIAGNOSIS — L819 Disorder of pigmentation, unspecified: Secondary | ICD-10-CM | POA: Diagnosis not present

## 2020-06-26 DIAGNOSIS — S31829D Unspecified open wound of left buttock, subsequent encounter: Secondary | ICD-10-CM | POA: Diagnosis not present

## 2020-06-26 DIAGNOSIS — R600 Localized edema: Secondary | ICD-10-CM | POA: Diagnosis not present

## 2020-06-26 DIAGNOSIS — N764 Abscess of vulva: Secondary | ICD-10-CM | POA: Diagnosis not present

## 2020-06-26 DIAGNOSIS — G35 Multiple sclerosis: Secondary | ICD-10-CM | POA: Diagnosis not present

## 2020-07-24 DIAGNOSIS — G35 Multiple sclerosis: Secondary | ICD-10-CM | POA: Diagnosis not present

## 2020-07-24 DIAGNOSIS — I739 Peripheral vascular disease, unspecified: Secondary | ICD-10-CM | POA: Diagnosis not present

## 2020-07-24 DIAGNOSIS — L0390 Cellulitis, unspecified: Secondary | ICD-10-CM | POA: Diagnosis not present

## 2020-11-12 ENCOUNTER — Encounter (HOSPITAL_COMMUNITY): Payer: Self-pay | Admitting: Student in an Organized Health Care Education/Training Program

## 2020-11-12 ENCOUNTER — Inpatient Hospital Stay (HOSPITAL_COMMUNITY): Payer: Medicare PPO

## 2020-11-12 ENCOUNTER — Emergency Department (HOSPITAL_COMMUNITY): Payer: Medicare PPO

## 2020-11-12 ENCOUNTER — Observation Stay (HOSPITAL_COMMUNITY)
Admission: EM | Admit: 2020-11-12 | Discharge: 2020-11-13 | Disposition: A | Discharge status: Home / Self Care | Payer: Medicare PPO | Attending: Student in an Organized Health Care Education/Training Program | Admitting: Student in an Organized Health Care Education/Training Program

## 2020-11-12 ENCOUNTER — Other Ambulatory Visit: Payer: Self-pay

## 2020-11-12 DIAGNOSIS — Z79899 Other long term (current) drug therapy: Secondary | ICD-10-CM | POA: Insufficient documentation

## 2020-11-12 DIAGNOSIS — N179 Acute kidney failure, unspecified: Principal | ICD-10-CM | POA: Diagnosis present

## 2020-11-12 DIAGNOSIS — N200 Calculus of kidney: Secondary | ICD-10-CM | POA: Diagnosis not present

## 2020-11-12 DIAGNOSIS — E876 Hypokalemia: Secondary | ICD-10-CM | POA: Diagnosis not present

## 2020-11-12 DIAGNOSIS — Z20822 Contact with and (suspected) exposure to covid-19: Secondary | ICD-10-CM | POA: Insufficient documentation

## 2020-11-12 DIAGNOSIS — L89151 Pressure ulcer of sacral region, stage 1: Secondary | ICD-10-CM | POA: Diagnosis not present

## 2020-11-12 DIAGNOSIS — N1330 Unspecified hydronephrosis: Secondary | ICD-10-CM | POA: Diagnosis not present

## 2020-11-12 DIAGNOSIS — I959 Hypotension, unspecified: Secondary | ICD-10-CM | POA: Diagnosis not present

## 2020-11-12 DIAGNOSIS — R112 Nausea with vomiting, unspecified: Secondary | ICD-10-CM | POA: Diagnosis not present

## 2020-11-12 DIAGNOSIS — K5641 Fecal impaction: Secondary | ICD-10-CM

## 2020-11-12 DIAGNOSIS — G35 Multiple sclerosis: Secondary | ICD-10-CM | POA: Diagnosis not present

## 2020-11-12 DIAGNOSIS — Z87891 Personal history of nicotine dependence: Secondary | ICD-10-CM | POA: Diagnosis not present

## 2020-11-12 DIAGNOSIS — R1111 Vomiting without nausea: Secondary | ICD-10-CM | POA: Diagnosis not present

## 2020-11-12 DIAGNOSIS — R9431 Abnormal electrocardiogram [ECG] [EKG]: Secondary | ICD-10-CM

## 2020-11-12 DIAGNOSIS — K7460 Unspecified cirrhosis of liver: Secondary | ICD-10-CM

## 2020-11-12 DIAGNOSIS — E860 Dehydration: Secondary | ICD-10-CM | POA: Diagnosis not present

## 2020-11-12 DIAGNOSIS — R110 Nausea: Secondary | ICD-10-CM | POA: Diagnosis not present

## 2020-11-12 DIAGNOSIS — K8020 Calculus of gallbladder without cholecystitis without obstruction: Secondary | ICD-10-CM | POA: Diagnosis not present

## 2020-11-12 DIAGNOSIS — I10 Essential (primary) hypertension: Secondary | ICD-10-CM | POA: Diagnosis present

## 2020-11-12 DIAGNOSIS — K5900 Constipation, unspecified: Secondary | ICD-10-CM | POA: Diagnosis present

## 2020-11-12 DIAGNOSIS — L8990 Pressure ulcer of unspecified site, unspecified stage: Secondary | ICD-10-CM | POA: Diagnosis present

## 2020-11-12 LAB — RESP PANEL BY RT-PCR (FLU A&B, COVID) ARPGX2
Influenza A by PCR: NEGATIVE
Influenza B by PCR: NEGATIVE
SARS Coronavirus 2 by RT PCR: NEGATIVE

## 2020-11-12 LAB — BASIC METABOLIC PANEL
Anion gap: 7 (ref 5–15)
BUN: 70 mg/dL — ABNORMAL HIGH (ref 6–20)
CO2: 26 mmol/L (ref 22–32)
Calcium: 8 mg/dL — ABNORMAL LOW (ref 8.9–10.3)
Chloride: 105 mmol/L (ref 98–111)
Creatinine, Ser: 1.18 mg/dL — ABNORMAL HIGH (ref 0.44–1.00)
GFR, Estimated: 56 mL/min — ABNORMAL LOW (ref >60)
Glucose, Bld: 90 mg/dL (ref 70–99)
Potassium: 2.7 mmol/L — CL (ref 3.5–5.1)
Sodium: 138 mmol/L (ref 135–145)

## 2020-11-12 LAB — URINALYSIS, ROUTINE W REFLEX MICROSCOPIC
Bilirubin Urine: NEGATIVE
Glucose, UA: NEGATIVE mg/dL
Ketones, ur: NEGATIVE mg/dL
Nitrite: NEGATIVE
Protein, ur: 30 mg/dL — AB
Specific Gravity, Urine: 1.011 (ref 1.005–1.030)
WBC, UA: >50 WBC/hpf — ABNORMAL HIGH (ref 0–5)
pH: 5 (ref 5.0–8.0)

## 2020-11-12 LAB — COMPREHENSIVE METABOLIC PANEL
ALT: 33 U/L (ref 0–44)
AST: 35 U/L (ref 15–41)
Albumin: 2.4 g/dL — ABNORMAL LOW (ref 3.5–5.0)
Alkaline Phosphatase: 102 U/L (ref 38–126)
Anion gap: 12 (ref 5–15)
BUN: 100 mg/dL — ABNORMAL HIGH (ref 6–20)
CO2: 27 mmol/L (ref 22–32)
Calcium: 8.5 mg/dL — ABNORMAL LOW (ref 8.9–10.3)
Chloride: 97 mmol/L — ABNORMAL LOW (ref 98–111)
Creatinine, Ser: 1.48 mg/dL — ABNORMAL HIGH (ref 0.44–1.00)
GFR, Estimated: 42 mL/min — ABNORMAL LOW (ref >60)
Glucose, Bld: 145 mg/dL — ABNORMAL HIGH (ref 70–99)
Potassium: 2 mmol/L — CL (ref 3.5–5.1)
Sodium: 136 mmol/L (ref 135–145)
Total Bilirubin: 1 mg/dL (ref 0.3–1.2)
Total Protein: 6.8 g/dL (ref 6.5–8.1)

## 2020-11-12 LAB — CBC WITH DIFFERENTIAL/PLATELET
Abs Immature Granulocytes: 0.04 10*3/uL (ref 0.00–0.07)
Basophils Absolute: 0 10*3/uL (ref 0.0–0.1)
Basophils Relative: 0 %
Eosinophils Absolute: 0.1 10*3/uL (ref 0.0–0.5)
Eosinophils Relative: 1 %
HCT: 43.2 % (ref 36.0–46.0)
Hemoglobin: 14.2 g/dL (ref 12.0–15.0)
Immature Granulocytes: 0 %
Lymphocytes Relative: 17 %
Lymphs Abs: 1.6 10*3/uL (ref 0.7–4.0)
MCH: 29 pg (ref 26.0–34.0)
MCHC: 32.9 g/dL (ref 30.0–36.0)
MCV: 88.2 fL (ref 80.0–100.0)
Monocytes Absolute: 1.1 10*3/uL — ABNORMAL HIGH (ref 0.1–1.0)
Monocytes Relative: 11 %
Neutro Abs: 6.9 10*3/uL (ref 1.7–7.7)
Neutrophils Relative %: 71 %
Platelets: 154 10*3/uL (ref 150–400)
RBC: 4.9 MIL/uL (ref 3.87–5.11)
RDW: 14.2 % (ref 11.5–15.5)
WBC: 9.7 10*3/uL (ref 4.0–10.5)
nRBC: 0 % (ref 0.0–0.2)

## 2020-11-12 LAB — TSH: TSH: 0.964 u[IU]/mL (ref 0.350–4.500)

## 2020-11-12 LAB — LIPASE, BLOOD: Lipase: 104 U/L — ABNORMAL HIGH (ref 11–51)

## 2020-11-12 LAB — MAGNESIUM: Magnesium: 2.1 mg/dL (ref 1.7–2.4)

## 2020-11-12 MED ORDER — MELOXICAM 7.5 MG PO TABS
7.5000 mg | ORAL_TABLET | Freq: Every day | ORAL | Status: DC
  Administered 2020-11-12: 7.5 mg via ORAL
  Filled 2020-11-12 (×2): qty 1

## 2020-11-12 MED ORDER — LACTATED RINGERS IV BOLUS
1000.0000 mL | Freq: Once | INTRAVENOUS | Status: AC
  Administered 2020-11-12: 1000 mL via INTRAVENOUS

## 2020-11-12 MED ORDER — POLYETHYLENE GLYCOL 3350 17 G PO PACK
17.0000 g | PACK | Freq: Every day | ORAL | Status: DC
  Administered 2020-11-12 – 2020-11-13 (×2): 17 g via ORAL
  Filled 2020-11-12 (×2): qty 1

## 2020-11-12 MED ORDER — SENNOSIDES-DOCUSATE SODIUM 8.6-50 MG PO TABS
2.0000 | ORAL_TABLET | Freq: Two times a day (BID) | ORAL | Status: DC
  Administered 2020-11-12 – 2020-11-13 (×2): 2 via ORAL
  Filled 2020-11-12 (×3): qty 2

## 2020-11-12 MED ORDER — SODIUM CHLORIDE 0.9% FLUSH
3.0000 mL | Freq: Two times a day (BID) | INTRAVENOUS | Status: DC
  Administered 2020-11-12 – 2020-11-13 (×2): 3 mL via INTRAVENOUS

## 2020-11-12 MED ORDER — LACTATED RINGERS IV SOLN: INTRAVENOUS | Status: DC

## 2020-11-12 MED ORDER — POTASSIUM CHLORIDE 10 MEQ/100ML IV SOLN
10.0000 meq | Freq: Once | INTRAVENOUS | Status: AC
  Administered 2020-11-12: 10 meq via INTRAVENOUS
  Filled 2020-11-12: qty 100

## 2020-11-12 MED ORDER — POTASSIUM CHLORIDE 10 MEQ/100ML IV SOLN
10.0000 meq | INTRAVENOUS | Status: AC
Start: 2020-11-12 — End: 2020-11-12
  Administered 2020-11-12 (×6): 10 meq via INTRAVENOUS
  Filled 2020-11-12 (×5): qty 100

## 2020-11-12 MED ORDER — ACETAMINOPHEN 650 MG RE SUPP: 650.0000 mg | Freq: Four times a day (QID) | RECTAL | Status: DC | PRN

## 2020-11-12 MED ORDER — GABAPENTIN 300 MG PO CAPS
300.0000 mg | ORAL_CAPSULE | Freq: Three times a day (TID) | ORAL | Status: DC
  Administered 2020-11-12 (×2): 300 mg via ORAL
  Filled 2020-11-12 (×2): qty 1

## 2020-11-12 MED ORDER — ACETAMINOPHEN 325 MG PO TABS
650.0000 mg | ORAL_TABLET | Freq: Four times a day (QID) | ORAL | Status: DC | PRN
  Administered 2020-11-13: 650 mg via ORAL
  Filled 2020-11-12: qty 2

## 2020-11-12 MED ORDER — SODIUM CHLORIDE 0.9 % IV BOLUS
1000.0000 mL | Freq: Once | INTRAVENOUS | Status: AC
  Administered 2020-11-12: 1000 mL via INTRAVENOUS

## 2020-11-12 MED ORDER — SODIUM CHLORIDE 0.9 % IV SOLN
1.0000 g | INTRAVENOUS | Status: DC
  Administered 2020-11-12 – 2020-11-13 (×2): 1 g via INTRAVENOUS
  Filled 2020-11-12 (×2): qty 10

## 2020-11-12 MED ORDER — POTASSIUM CHLORIDE CRYS ER 20 MEQ PO TBCR
40.0000 meq | EXTENDED_RELEASE_TABLET | Freq: Two times a day (BID) | ORAL | Status: DC
  Administered 2020-11-12 – 2020-11-13 (×3): 40 meq via ORAL
  Filled 2020-11-12 (×3): qty 2

## 2020-11-12 MED ORDER — POTASSIUM CHLORIDE 10 MEQ/100ML IV SOLN
10.0000 meq | INTRAVENOUS | Status: AC
  Administered 2020-11-12 (×4): 10 meq via INTRAVENOUS
  Filled 2020-11-12 (×5): qty 100

## 2020-11-12 MED ORDER — BACLOFEN 20 MG PO TABS
20.0000 mg | ORAL_TABLET | Freq: Three times a day (TID) | ORAL | Status: DC
  Administered 2020-11-12 – 2020-11-13 (×4): 20 mg via ORAL
  Filled 2020-11-12 (×4): qty 1

## 2020-11-12 MED ORDER — ENOXAPARIN SODIUM 40 MG/0.4ML IJ SOSY
40.0000 mg | PREFILLED_SYRINGE | INTRAMUSCULAR | Status: DC
  Administered 2020-11-12: 40 mg via SUBCUTANEOUS
  Filled 2020-11-12: qty 0.4

## 2020-11-12 MED ORDER — GABAPENTIN 300 MG PO CAPS
600.0000 mg | ORAL_CAPSULE | Freq: Every day | ORAL | Status: DC
  Administered 2020-11-12: 600 mg via ORAL
  Filled 2020-11-12 (×2): qty 2

## 2020-11-12 MED ORDER — FENTANYL CITRATE PF 50 MCG/ML IJ SOSY
50.0000 ug | PREFILLED_SYRINGE | Freq: Once | INTRAMUSCULAR | Status: AC
  Administered 2020-11-12: 50 ug via INTRAVENOUS
  Filled 2020-11-12: qty 1

## 2020-11-12 NOTE — H&P (Signed)
Date: 11/12/2020               Patient Name:  [name] MRN: [id number]  DOB: 12/04/1970 Age / Sex: 50 y.o., female   PCP: Marletta Lor, NP         Medical Service: Internal Medicine Teaching Service         Attending Physician: Dr. Oswaldo Done, Marquita Palms, *    First Contact: Rudene Christians, DO Pager: KM [phone]  Second Contact: Salena Saner, MD Pager: Turner Daniels [phone]       After Hours (After 5p/  First Contact Pager: [phone]  weekends / holidays): Second Contact Pager: [phone]   SUBJECTIVE  Chief Complaint: abdominal pain  History of Present Illness: [name] is a 50 y.o. female with a pertinent PMH of multiple sclerosis who is bed-bound and hypertension who presents to [facility] with abdominal pain and vomiting.  Ms Flavia Bruss reports three days of diffuse abdominal pain and vomiting. Patient reports that she has been constipated prior to this and required dulcolax on Tuesday resulting in a large bowel movement. However, the following day, she developed suprapubic abdominal pain radiating to lower quadrants, epigastric and bilateral upper quadrants.  She reports associated vomiting that was initially food content but later became bilious.  She has had multiple episodes of emesis daily since then with last episode of bilious emesis being yesterday.  She has been unable to tolerate oral intake since then.  She does report ongoing nausea.  She denies any headaches, fevers, chest pain, shortness of breath.   In the ED, the patient was afebrile, mildly hypotensive with SBP of 108, HR 60's and saturating well on room air. She was noted to have ongoing diffuse abdominal pain and nausea for which she was giving 1 dose of Bentyl.  CMP significant for hypokalemia to 2.0, BUN/sCr elevated to 100/1.48.  CBC without leukocytosis or anemia.  Lipase elevated to 104.  CT abdomen pelvis obtained that was significant for rectal stool impaction with mass-effect on the bladder causing mild bilateral  hydroureteronephrosis.  Fecal disimpaction performed at bedside by ED provider.  Patient admitted for further management of her acute renal failure and hyperkalemia.  Medications: No current facility-administered medications on file prior to encounter.   Current Outpatient Medications on File Prior to Encounter  Medication Sig Dispense Refill   baclofen (LIORESAL) 20 MG tablet Take 20 mg by mouth 3 (three) times daily.     carvedilol (COREG) 3.125 MG tablet Take 1 tablet (3.125 mg total) by mouth 2 (two) times daily with a meal. 60 tablet 0   furosemide (LASIX) 20 MG tablet Take 40 mg by mouth daily.     gabapentin (NEURONTIN) 300 MG capsule Take 300 mg by mouth See admin instructions. 300 mg  morning, noon and dinner 600 mg at bedtime.     ibuprofen (ADVIL) 200 MG tablet Take 200 mg by mouth 3 (three) times daily.     meloxicam (MOBIC) 7.5 MG tablet Take 7.5 mg by mouth daily.     collagenase (SANTYL) ointment Apply topically daily. (Patient not taking: No sig reported) 30 g 0   doxycycline (VIBRA-TABS) 100 MG tablet Take 1 tablet (100 mg total) by mouth 2 (two) times daily. (Patient not taking: No sig reported) 60 tablet 0   feeding supplement, ENSURE ENLIVE, (ENSURE ENLIVE) LIQD Take 237 mLs by mouth 2 (two) times daily between meals. (Patient not taking: No sig reported) 237 mL 12   levofloxacin (LEVAQUIN) 500  MG tablet Take 1 tablet (500 mg total) by mouth daily. (Patient not taking: No sig reported) 30 tablet 0    Past Medical History:  Past Medical History:  Diagnosis Date   Arthritis    MS (multiple sclerosis) (HCC)     Social:  Patient lives at home with her parents. She is bed bound and requires assistance in ADLs. She reports prior history of tobacco use of 1.5-2 ppd for 25 years; quit 11 years ago. She denies any history of alcohol use or illicit drug use.   Family History: Father - heart disease, diabetes Mother - hypertension Sister -  hypertension  Allergies: Allergies as of 11/12/2020 - Review Complete 11/12/2020  Allergen Reaction Noted   Tizanidine Diarrhea 01/25/2019   Tomato  01/27/2019    Review of Systems: A complete ROS was negative except as per HPI.   OBJECTIVE:  Physical Exam: Blood pressure 106/63, pulse 68, temperature (!) 96.9 F (36.1 C), temperature source Rectal, resp. rate 10, height 5\' 1"  (1.549 m), weight 83.9 kg, SpO2 100 %. Physical Exam  Constitutional: Chronically ill-appearing middle-aged female, no distress.  HENT: Normocephalic and atraumatic, EOMI, conjunctiva normal, dry mucous membranes Cardiovascular: Normal rate, regular rhythm, S1 and S2 present, no murmurs, rubs, gallops.  Distal pulses intact; bilateral DP difficult to palpate due to edema but present with Doppler; capillary refill <2 sec Respiratory: No respiratory distress, no accessory muscle use. Lungs are clear to auscultation bilaterally. On Room air GI: Nondistended, soft, nontender to palpation, normal bowel sounds Musculoskeletal: Decreased bulk with increased tone in all extremities. Nonpitting edema in bilateral feet.  Neurological: Is alert and oriented x4, no apparent focal deficits noted. Skin: Warm and dry.  Sacrum with stage I pressure ulcer Psychiatric: Normal mood and affect. Behavior is normal.   Pertinent Labs: CBC    Component Value Date/Time   WBC 9.7 11/12/2020 0159   RBC 4.90 11/12/2020 0159   HGB 14.2 11/12/2020 0159   HCT 43.2 11/12/2020 0159   PLT 154 11/12/2020 0159   MCV 88.2 11/12/2020 0159   MCH 29.0 11/12/2020 0159   MCHC 32.9 11/12/2020 0159   RDW 14.2 11/12/2020 0159   LYMPHSABS 1.6 11/12/2020 0159   MONOABS 1.1 (H) 11/12/2020 0159   EOSABS 0.1 11/12/2020 0159   BASOSABS 0.0 11/12/2020 0159     CMP     Component Value Date/Time   NA 136 11/12/2020 0159   K 2.0 (LL) 11/12/2020 0159   CL 97 (L) 11/12/2020 0159   CO2 27 11/12/2020 0159   GLUCOSE 145 (H) 11/12/2020 0159   BUN 100  (H) 11/12/2020 0159   CREATININE 1.48 (H) 11/12/2020 0159   CALCIUM 8.5 (L) 11/12/2020 0159   PROT 6.8 11/12/2020 0159   ALBUMIN 2.4 (L) 11/12/2020 0159   AST 35 11/12/2020 0159   ALT 33 11/12/2020 0159   ALKPHOS 102 11/12/2020 0159   BILITOT 1.0 11/12/2020 0159   GFRNONAA 42 (L) 11/12/2020 0159   GFRAA >60 02/02/2019 0539    Pertinent Imaging: CT ABDOMEN PELVIS WO CONTRAST  Result Date: 11/12/2020 CLINICAL DATA:  Acute nonlocalized abdominal pain EXAM: CT ABDOMEN AND PELVIS WITHOUT CONTRAST TECHNIQUE: Multidetector CT imaging of the abdomen and pelvis was performed following the standard protocol without IV contrast. COMPARISON:  None. FINDINGS: Lower chest:  No contributory findings. Hepatobiliary: Cirrhotic liver morphology.No evidence of biliary obstruction or stone. Pancreas: Unremarkable. Spleen: Enlarged, likely from portal hypertension in this setting. Adrenals/Urinary Tract: Negative adrenals. Mild bilateral hydroureteronephrosis. The bladder  is only moderately distended but is anteriorly displaced and deformed. Right renal hilar calcifications appear atherosclerotic. Two small left lower pole renal calculi. Stomach/Bowel: Stool distended rectum with 11 cm diameter and rectal wall thickening. Moderate stool elsewhere within the colon. No volvulus or obstruction. Scratches Vascular/Lymphatic: No acute vascular abnormality. No mass or adenopathy. Reproductive:No pathologic findings. Other: No ascites or pneumoperitoneum. Musculoskeletal: No acute abnormalities. Muscular atrophy which is generalized. Lumbar spine degeneration with multilevel thoracic upper lumbar bridging osteophytes. IMPRESSION: 1. Rectal stool impaction. Mass effect on the bladder causes mild bilateral hydroureteronephrosis. 2. Cirrhosis. Electronically Signed   By: Marnee Spring M.D.   On: 11/12/2020 06:45    EKG: personally reviewed my interpretation is normal sinus rhythm, T wave inversion in V1 prolonged QT interval  with Qtc 557; Rate 65  ASSESSMENT & PLAN:  Assessment: Active Problems:   AKI (acute kidney injury) (HCC)   [name] is a 50 y.o. with pertinent PMH of multiple sclerosis and hypertension who presented with abdominal pain and vomiting and admit for acute renal failure on hospital day 0  Plan: #AKI #Hypokalemia Patient noted to have a serum creatinine to 1.48 (baseline ~1.0).  She was also noted to have bilateral hydroureteronephrosis on imaging.  And significant hypokalemia to 2.0 with an EKG changes prolonged QTc interval.  Suspect her to bilateral hydroureteronephrosis is in setting of rectal stool impaction that has been relieved.  AKI in was likely secondary to obstruction and GI losses with 3 days of vomiting as she appeared hypovolemic on examination.  Patient received 3 L of IV fluids in the ED.  -IV potassium 10 mEq x 6 doses -Trend BMP -Avoid QT prolonging medication  #Abdominal pain #UTI Patient presented with abdominal pain and vomiting for 3 days.  Patient reports that this was initially suprapubic in nature.  She does note having constipation but did have a large palpable day prior to initiation of abdominal pain.  CT abdomen pelvis with rectal stool impaction with mass-effect on the bladder causing bilateral hydroureteronephrosis. Also noted to have moderate stool burden in colon. Patient disimpacted in the ED after receiving fentanyl. On my examination, abdomen is nondistended, soft, nontender to palpation. Urinalysis with moderate hemoglobinuria, large leukocytes and few bacteria.  Patient is afebrile without leukocytosis.  However, given that she is bedbound and with suprapubic abdominal pain, concern for possible UTI. -Start bowel regimen with Senokot and Miralax daily  -IV Rocephin 1 g daily -Follow-up urine culture  #Cirrhosis Patient noted to have cirrhosis on imaging.  She does not have any history of this in the past and there is no prior images to compare.   Does not have any significant LFT elevation. -RUQ ultrasound  #Multiple sclerosis Patient has a history of multiple sclerosis resulting in her being bedbound and dependent on her parents for most of her ADLs.  On examination, bilateral upper and lower extremities with increased tone and decreased bulk.  Does have some contractures. -PT OT eval  #Hypertension Patient reports a history of hypertension for which she is on carvedilol 3.125 mg twice daily.  On examination, patient has soft BPs with systolic in the 90s to low 100s.  We will hold off on antihypertensive at this time. -Continue to monitor -Can resume Coreg 3.125 mg twice daily once normotensive  Best Practice: Diet: Regular diet IVF: Fluids: LR, Rate: None VTE: enoxaparin (LOVENOX) injection 40 mg Start: 11/12/20 1400 Code: Full AB: Rocephin Status: Inpatient with expected length of stay greater than 2  midnights. Anticipated Discharge Location: Home Barriers to Discharge: Medical stability and IV antibiotics  Signature: Eliezer Bottom, MD Internal Medicine Resident, PGY-3 [name]  Pager: [phone] 9:28 AM, 11/12/2020   Please contact the on call pager after 5 pm and on weekends at [phone].

## 2020-11-12 NOTE — ED Triage Notes (Signed)
Pt BIBA from home d/t nausea and vomiting that's been ongoing since this past Wednesday. Pt presents as lethargic at this time but A&Ox4. PTA, medic gave pt 4mg  Zofran and 1L of NS via 20G IV in left forearm. Per medic, patient BP soft. Pt current HR 70, SpO2 99, and RR 14 with current BP 108/56.

## 2020-11-12 NOTE — ED Provider Notes (Signed)
[facility] EMERGENCY DEPARTMENT Provider Note   CSN: [id number] Arrival date & time: 11/12/20  0128     History Chief Complaint  Patient presents with   Nausea    N/V since Wednesday    [name] is a 50 y.o. female.  The history is provided by the patient and a parent.  Emesis Severity:  Moderate Duration:  3 days Timing:  Intermittent Quality:  Bilious material Progression:  Worsening Chronicity:  New Relieved by:  Nothing Worsened by:  Nothing Associated symptoms: abdominal pain   Associated symptoms: no diarrhea and no fever   Patient with history of significant debility due to multiple sclerosis presents with vomiting.  The vomiting has been present for up to 3 days She reports abdominal pain.  She has been passing bowel movements.  No fevers.  No chest pain or shortness of breath. Patient has had worsening fatigue     Past Medical History:  Diagnosis Date   Arthritis    MS (multiple sclerosis) (HCC)     Patient Active Problem List   Diagnosis Date Noted   Sacral osteomyelitis (HCC) 01/28/2019   Stage 4 decubitus ulcer (HCC) 01/25/2019   Cellulitis 01/25/2019   Decubitus skin ulcer 01/25/2019   Anxiety 05/13/2013   Essential hypertension, benign 04/25/2013   Encounter for long-term (current) use of other medications 04/25/2013   Constipation 04/09/2013   Displaced bimalleolar fracture of left ankle 03/30/2013   Multiple sclerosis (HCC) 08/25/2012   Chest pain 08/25/2012   Panic attack 08/25/2012    Past Surgical History:  Procedure Laterality Date   NO PAST SURGERIES     ORIF ANKLE FRACTURE Left 03/30/2013   Procedure: OPEN REDUCTION INTERNAL FIXATION (ORIF) LEFT ANKLE FRACTURE;  Surgeon: Toni Arthurs, MD;  Location: [facility] OR;  Service: Orthopedics;  Laterality: Left;     OB History   No obstetric history on file.     No family history on file.  Social History   Tobacco Use   Smoking status: Former    Packs/day: 2.00     Years: 20.00    Pack years: 40.00    Types: Cigarettes    Quit date: 07/03/2008    Years since quitting: 12.3   Smokeless tobacco: Former    Quit date: 01/10/2019   Tobacco comments:    USES ELECTRONIC CIGARETTES/ quit 01/2019  Substance Use Topics   Alcohol use: No   Drug use: No    Home Medications Prior to Admission medications   Medication Sig Start Date End Date Taking? Authorizing Provider  baclofen (LIORESAL) 20 MG tablet Take 20 mg by mouth 3 (three) times daily. 11/05/18  Yes [provider]  carvedilol (COREG) 3.125 MG tablet Take 1 tablet (3.125 mg total) by mouth 2 (two) times daily with a meal. 02/01/19 11/12/20 Yes Dahal, Melina Schools, MD  furosemide (LASIX) 20 MG tablet Take 40 mg by mouth daily. 06/26/20  Yes [provider]  gabapentin (NEURONTIN) 300 MG capsule Take 300 mg by mouth See admin instructions. 300 mg  morning, noon and dinner 600 mg at bedtime. 12/14/18  Yes [provider]  ibuprofen (ADVIL) 200 MG tablet Take 200 mg by mouth 3 (three) times daily.   Yes [provider]  meloxicam (MOBIC) 7.5 MG tablet Take 7.5 mg by mouth daily. 11/05/18  Yes [provider]  collagenase (SANTYL) ointment Apply topically daily. Patient not taking: No sig reported 02/03/19   Lorin Glass, MD  doxycycline (VIBRA-TABS) 100 MG  tablet Take 1 tablet (100 mg total) by mouth 2 (two) times daily. Patient not taking: No sig reported 03/24/19   Veryl Speak, FNP  feeding supplement, ENSURE ENLIVE, (ENSURE ENLIVE) LIQD Take 237 mLs by mouth 2 (two) times daily between meals. Patient not taking: No sig reported 02/01/19   Lorin Glass, MD  levofloxacin (LEVAQUIN) 500 MG tablet Take 1 tablet (500 mg total) by mouth daily. Patient not taking: No sig reported 03/24/19   Veryl Speak, FNP    Allergies    Tizanidine and Tomato  Review of Systems   Review of Systems  Constitutional:  Positive for fatigue. Negative for fever.   Cardiovascular:  Negative for chest pain.  Gastrointestinal:  Positive for abdominal pain and vomiting. Negative for diarrhea.  Neurological:  Positive for weakness.  All other systems reviewed and are negative.  Physical Exam Updated Vital Signs BP (!) 91/46   Pulse 68   Temp (!) 96.9 F (36.1 C) (Rectal)   Resp 15   Ht 1.549 m (5\' 1" )   Wt 83.9 kg   SpO2 99%   BMI 34.96 kg/m   Physical Exam CONSTITUTIONAL: Chronically ill-appearing HEAD: Normocephalic/atraumatic EYES: EOMI/PERRL ENMT: Mucous membranes dry, hirsutism NECK: supple no meningeal signs SPINE/BACK:entire spine nontender CV: S1/S2 noted, no murmurs/rubs/gallops noted LUNGS: Lungs are clear to auscultation bilaterally, no apparent distress ABDOMEN: soft, obese.  Diffuse moderate tenderness. GU:no cva tenderness NEURO: Pt is awake/alert EXTREMITIES: pulses normal/equal, full ROM, contractures noted of the lower extremities.  No wounds are noted extremities SKIN: warm, color normal No large wounds to the sacrum or buttocks are noted.  Nurse present for exam PSYCH: no abnormalities of mood noted, alert and oriented to situation  ED Results / Procedures / Treatments   Labs (all labs ordered are listed, but only abnormal results are displayed) Labs Reviewed  CBC WITH DIFFERENTIAL/PLATELET - Abnormal; Notable for the following components:      Result Value   Monocytes Absolute 1.1 (*)    All other components within normal limits  COMPREHENSIVE METABOLIC PANEL - Abnormal; Notable for the following components:   Potassium 2.0 (*)    Chloride 97 (*)    Glucose, Bld 145 (*)    BUN 100 (*)    Creatinine, Ser 1.48 (*)    Calcium 8.5 (*)    Albumin 2.4 (*)    GFR, Estimated 42 (*)    All other components within normal limits  LIPASE, BLOOD - Abnormal; Notable for the following components:   Lipase 104 (*)    All other components within normal limits  RESP PANEL BY RT-PCR (FLU A&B, COVID) ARPGX2  URINALYSIS,  ROUTINE W REFLEX MICROSCOPIC  MAGNESIUM    EKG EKG Interpretation  Date/Time:  Sunday November 12 2020 01:37:46 EDT Ventricular Rate:  65 PR Interval:  182 QRS Duration: 97 QT Interval:  535 QTC Calculation: 557 R Axis:   -30 Text Interpretation: Sinus rhythm Inferior infarct, old Prolonged QT interval Confirmed by 06[birth year]16[birth year]1977 (Zadie Rhine) on 11/12/2020 1:58:08 AM  Radiology CT ABDOMEN PELVIS WO CONTRAST  Result Date: 11/12/2020 CLINICAL DATA:  Acute nonlocalized abdominal pain EXAM: CT ABDOMEN AND PELVIS WITHOUT CONTRAST TECHNIQUE: Multidetector CT imaging of the abdomen and pelvis was performed following the standard protocol without IV contrast. COMPARISON:  None. FINDINGS: Lower chest:  No contributory findings. Hepatobiliary: Cirrhotic liver morphology.No evidence of biliary obstruction or stone. Pancreas: Unremarkable. Spleen: Enlarged, likely from portal hypertension in this setting. Adrenals/Urinary Tract: Negative adrenals. Mild bilateral  hydroureteronephrosis. The bladder is only moderately distended but is anteriorly displaced and deformed. Right renal hilar calcifications appear atherosclerotic. Two small left lower pole renal calculi. Stomach/Bowel: Stool distended rectum with 11 cm diameter and rectal wall thickening. Moderate stool elsewhere within the colon. No volvulus or obstruction. Scratches Vascular/Lymphatic: No acute vascular abnormality. No mass or adenopathy. Reproductive:No pathologic findings. Other: No ascites or pneumoperitoneum. Musculoskeletal: No acute abnormalities. Muscular atrophy which is generalized. Lumbar spine degeneration with multilevel thoracic upper lumbar bridging osteophytes. IMPRESSION: 1. Rectal stool impaction. Mass effect on the bladder causes mild bilateral hydroureteronephrosis. 2. Cirrhosis. Electronically Signed   By: Marnee Spring M.D.   On: 11/12/2020 06:45    Procedures .Critical Care E&M  Date/Time: 11/12/2020 7:11 AM Performed by:  Zadie Rhine, MD  Critical care provider statement:    Critical care time (minutes):  45   Critical care start time:  11/12/2020 6:00 AM   Critical care end time:  11/12/2020 6:45 AM   Critical care time was exclusive of:  Separately billable procedures and treating other patients   Critical care was necessary to treat or prevent imminent or life-threatening deterioration of the following conditions:  Circulatory failure, dehydration and metabolic crisis   Critical care was time spent personally by me on the following activities:  Development of treatment plan with patient or surrogate, discussions with consultants, pulse oximetry, ordering and review of radiographic studies, ordering and review of laboratory studies, ordering and performing treatments and interventions, re-evaluation of patient's condition, evaluation of patient's response to treatment and examination of patient   I assumed direction of critical care for this patient from another provider in my specialty: no     Care discussed with: admitting provider   After initial E/M assessment, critical care services were subsequently performed that were exclusive of separately billable procedures or treatment.   Fecal disimpaction  Date/Time: 11/12/2020 7:00 AM Performed by: Zadie Rhine, MD Authorized by: Zadie Rhine, MD  Consent: Verbal consent obtained.  Sedation: Patient sedated: no  Patient tolerance: patient tolerated the procedure well with no immediate complications Comments: Large amount of hard stool was manually disimpacted, patient tolerated well.  No blood in stools noted     Medications Ordered in ED Medications  potassium chloride 10 mEq in 100 mL IVPB (10 mEq Intravenous New Bag/Given 11/12/20 0642)  fentaNYL (SUBLIMAZE) injection 50 mcg (has no administration in time range)  lactated ringers bolus 1,000 mL (1,000 mLs Intravenous Bolus 11/12/20 0357)  sodium chloride 0.9 % bolus 1,000 mL (0 mLs Intravenous  Stopped 11/12/20 0703)  potassium chloride 10 mEq in 100 mL IVPB (0 mEq Intravenous Stopped 11/12/20 0640)    ED Course  I have reviewed the triage vital signs and the nursing notes.  Pertinent labs & imaging results that were available during my care of the patient were reviewed by me and considered in my medical decision making (see chart for details).    MDM Rules/Calculators/A&P                           Patient presents from home.  She has a history of MS has been having vomiting for up to 3 days.  She also has focal abdominal pain.  Patient found to have significant hypokalemia and dehydration.  She has had mild hypotension that is resolving with IV fluids. However due to focal abdominal tenderness, CT imaging has been ordered.  Patient is afebrile Suspect blood pressure is due  to dehydration rather than sepsis 7:16 AM CT imaging reveals large stool impaction causing mass-effect on the bladder.  No other acute findings.  Discussed CT results with radiologist Dr. Grace Isaac. With nurse present, I performed a fecal disimpaction at bedside. Patient will likely need ongoing treatment for constipation while in the hospital. Patient was found to have acute renal failure, dehydration as well as hypokalemia with acute EKG changes revealing prolonged QT. Patient will need IV fluids and IV runs of potassium. 7:32 AM Discussed with internal medicine residency for admission. BP 106/63   Pulse 68   Temp (!) 96.9 F (36.1 C) (Rectal)   Resp 10   Ht 1.549 m (5\' 1" )   Wt 83.9 kg   SpO2 100%   BMI 34.96 kg/m  Patient has been stabilized in the Emergency Department.  Advised the patient will likely need ongoing care for her constipation Final Clinical Impression(s) / ED Diagnoses Final diagnoses:  Hypokalemia  Dehydration  Prolonged Q-T interval on ECG  MS (multiple sclerosis) (HCC)  Fecal impaction [facility])    Rx / DC Orders ED Discharge Orders     None        [facility],  MD 11/12/20 [phone]

## 2020-11-12 NOTE — ED Notes (Signed)
Patient transported to CT 

## 2020-11-12 NOTE — Evaluation (Signed)
Physical Therapy Evaluation Only Patient Details Name: [name] MRN: [id number] DOB: [birth year] Today's Date: 11/12/2020   History of Present Illness  Pt is a 50 y.o. female who presented 11/12/20 with abdominal pain, emesis, concern for possible UTI, and AKI. CT of the abdomen showed severe constipation with impaction of her ascending colon which was causing mass-effect on her bladder and mild bilateral hydronephrosis.  The stool impaction was relieved manually in the emergency department. PMH: MS, HTN, arthritis   Clinical Impression  Pt presents with condition above. PTA, she lives with her parents and has a CNA come assist her x2 days/week. At baseline, she is bedbound, denies sitting up EOB, uses a hoyer lift intermittently to get to her w/c, and needs assistance for all ADLs except donning her open back nightgowns. Pt is at her baseline level of function currently, needing TA for supine <> sit transitions and modA to roll using the rails. Pt with trace muscle activation noted in her legs, but unable to move them with gravity eliminated. Ankle and knee contractures noted also, which is her baseline. No further acute PT needs identified. All education completed and questions answered. PT will sign off.    Follow Up Recommendations No PT follow up    Equipment Recommendations  None recommended by PT    Recommendations for Other Services       Precautions / Restrictions Precautions Precautions: Fall Precaution Comments: monitor BP (hypotensive) Restrictions Weight Bearing Restrictions: No      Mobility  Bed Mobility Overal bed mobility: Needs Assistance Bed Mobility: Supine to Sit;Sit to Supine;Rolling Rolling: Mod assist   Supine to sit: Total assist;HOB elevated Sit to supine: Total assist;HOB elevated   General bed mobility comments: Cues provided for pt to pull on rail to assist in ascending trunk, pt trying but unsuccessful to lift trunk even minimally, TA to sit up. TA  to return to supine. ModA to boost hips with bed pad and with use of rails to roll.    Transfers                 General transfer comment: Deferred, unable at baseline  Ambulation/Gait             General Gait Details: Deferred, unable at baseline  Stairs            Wheelchair Mobility    Modified Rankin (Stroke Patients Only)       Balance Overall balance assessment: Needs assistance Sitting-balance support: Bilateral upper extremity supported;Single extremity supported;Feet supported Sitting balance-Leahy Scale: Poor Sitting balance - Comments: Brief moments of minA but majority of time needing mod-maxA posteriorly to sit statically EOB with 1-2 UE support. Can prop on elevated HOB with L elbow with min guard-minA. Postural control: Posterior lean     Standing balance comment: Deferred, unable at baseline                             Pertinent Vitals/Pain Pain Assessment: Faces Faces Pain Scale: Hurts little more Pain Location: legs with ROM Pain Descriptors / Indicators: Discomfort;Grimacing Pain Intervention(s): Limited activity within patient's tolerance;Monitored during session;Repositioned    Home Living Family/patient expects to be discharged to:: Private residence Living Arrangements: Parent Available Help at Discharge: Family;Available 24 hours/day;Personal care attendant (CNA x2 days/week) Type of Home: House Home Access: Stairs to enter (has ramp but does not fit correctly) Entrance Stairs-Rails: Right;Left Entrance Stairs-Number of Steps: 1 Home Layout:  One level Home Equipment: Bedside commode;Shower seat;Hospital bed;Wheelchair - manual;Other (comment) (manual hoyer lift) Additional Comments: Has not stood since 2015 when she broke her L leg    Prior Function Level of Independence: Needs assistance   Gait / Transfers Assistance Needed: Pt reports being bedbound and only using hoyer lift intermittently to get OOB to w/c.  Reports she does not sit up EOB.  ADL's / Homemaking Assistance Needed: Reports they do bird baths in bed and she wears backless night gowns so she can donn them independently.  Comments: CNA x2 days/week to assist with ADLs and bed sheet changes.     Hand Dominance        Extremity/Trunk Assessment   Upper Extremity Assessment Upper Extremity Assessment: Generalized weakness    Lower Extremity Assessment Lower Extremity Assessment: Generalized weakness;RLE deficits/detail;LLE deficits/detail RLE Deficits / Details: Trace muscle activation noted, but no AROM with gravity eliminated, gross MMT scores of 0-1; Knee extension PROM limited by ~10 degrees, ankle dorsiflexion PROM limited by ~20 degrees, rests with hips externally rotated and abducted and knee flexed but able PROM into hip adduction and internal rotation RLE Sensation:  (denies numbness/tingling) LLE Deficits / Details: Trace muscle activation noted, but no AROM with gravity eliminated, gross MMT scores of 0-1; Knee extension PROM limited by ~30 degrees, ankle dorsiflexion PROM limited by ~20 degrees, rests with hips externally rotated and abducted and knee flexed but able PROM into hip adduction and internal rotation LLE Sensation:  (denies numbness/tingling)    Cervical / Trunk Assessment Cervical / Trunk Assessment: Normal  Communication   Communication: Other (comment) (intermittent word-finding difficulty)  Cognition Arousal/Alertness: Awake/alert Behavior During Therapy: WFL for tasks assessed/performed Overall Cognitive Status: Within Functional Limits for tasks assessed                                 General Comments: WFL for tasks performed. Able to identify needs for PT to do to allow her to perform bed mobility efficiently. Able to recall personal hx.      General Comments General comments (skin integrity, edema, etc.): BP 89/51; frequent diarrhea    Exercises     Assessment/Plan    PT  Assessment Patent does not need any further PT services  PT Problem List         PT Treatment Interventions      PT Goals (Current goals can be found in the Care Plan section)  Acute Rehab PT Goals Patient Stated Goal: to go home PT Goal Formulation: All assessment and education complete, DC therapy Time For Goal Achievement: 11/13/20 Potential to Achieve Goals: Fair    Frequency     Barriers to discharge        Co-evaluation               AM-PAC PT "6 Clicks" Mobility  Outcome Measure Help needed turning from your back to your side while in a flat bed without using bedrails?: A Lot Help needed moving from lying on your back to sitting on the side of a flat bed without using bedrails?: Total Help needed moving to and from a bed to a chair (including a wheelchair)?: Total Help needed standing up from a chair using your arms (e.g., wheelchair or bedside chair)?: Total Help needed to walk in hospital room?: Total Help needed climbing 3-5 steps with a railing? : Total 6 Click Score: 7    End of  Session   Activity Tolerance: Patient tolerated treatment well Patient left: in bed;with call bell/phone within reach;with bed alarm set Nurse Communication: Mobility status;Other (comment) (BP) PT Visit Diagnosis: Muscle weakness (generalized) (M62.81)    Time: 1620-1701 PT Time Calculation (min) (ACUTE ONLY): 41 min   Charges:   PT Evaluation $PT Eval Moderate Complexity: 1 Mod PT Treatments $Therapeutic Activity: 23-37 mins        Raymond Gurney, PT, DPT Acute Rehabilitation Services  Pager: [phone] Office: [phone]   Jewel Baize 11/12/2020, 5:28 PM

## 2020-11-13 DIAGNOSIS — E860 Dehydration: Secondary | ICD-10-CM | POA: Diagnosis not present

## 2020-11-13 DIAGNOSIS — I10 Essential (primary) hypertension: Secondary | ICD-10-CM | POA: Diagnosis not present

## 2020-11-13 DIAGNOSIS — K5641 Fecal impaction: Secondary | ICD-10-CM | POA: Diagnosis not present

## 2020-11-13 DIAGNOSIS — N179 Acute kidney failure, unspecified: Secondary | ICD-10-CM | POA: Diagnosis not present

## 2020-11-13 DIAGNOSIS — Z7401 Bed confinement status: Secondary | ICD-10-CM | POA: Diagnosis not present

## 2020-11-13 DIAGNOSIS — Z20822 Contact with and (suspected) exposure to covid-19: Secondary | ICD-10-CM | POA: Diagnosis not present

## 2020-11-13 DIAGNOSIS — G35 Multiple sclerosis: Secondary | ICD-10-CM | POA: Diagnosis not present

## 2020-11-13 DIAGNOSIS — Z87891 Personal history of nicotine dependence: Secondary | ICD-10-CM | POA: Diagnosis not present

## 2020-11-13 DIAGNOSIS — E876 Hypokalemia: Secondary | ICD-10-CM | POA: Diagnosis not present

## 2020-11-13 DIAGNOSIS — R9431 Abnormal electrocardiogram [ECG] [EKG]: Secondary | ICD-10-CM | POA: Diagnosis not present

## 2020-11-13 DIAGNOSIS — I959 Hypotension, unspecified: Secondary | ICD-10-CM | POA: Diagnosis not present

## 2020-11-13 LAB — BASIC METABOLIC PANEL
Anion gap: 6 (ref 5–15)
BUN: 52 mg/dL — ABNORMAL HIGH (ref 6–20)
CO2: 22 mmol/L (ref 22–32)
Calcium: 7.8 mg/dL — ABNORMAL LOW (ref 8.9–10.3)
Chloride: 108 mmol/L (ref 98–111)
Creatinine, Ser: 0.95 mg/dL (ref 0.44–1.00)
GFR, Estimated: >60 mL/min (ref >60)
Glucose, Bld: 94 mg/dL (ref 70–99)
Potassium: 3.5 mmol/L (ref 3.5–5.1)
Sodium: 136 mmol/L (ref 135–145)

## 2020-11-13 LAB — CBC
HCT: 40.1 % (ref 36.0–46.0)
Hemoglobin: 13.2 g/dL (ref 12.0–15.0)
MCH: 29.1 pg (ref 26.0–34.0)
MCHC: 32.9 g/dL (ref 30.0–36.0)
MCV: 88.3 fL (ref 80.0–100.0)
Platelets: 163 10*3/uL (ref 150–400)
RBC: 4.54 MIL/uL (ref 3.87–5.11)
RDW: 14.5 % (ref 11.5–15.5)
WBC: 8.7 10*3/uL (ref 4.0–10.5)
nRBC: 0 % (ref 0.0–0.2)

## 2020-11-13 LAB — HIV ANTIBODY (ROUTINE TESTING W REFLEX): HIV Screen 4th Generation wRfx: NONREACTIVE

## 2020-11-13 MED ORDER — GABAPENTIN 300 MG PO CAPS
300.0000 mg | ORAL_CAPSULE | Freq: Three times a day (TID) | ORAL | Status: DC
  Administered 2020-11-13 (×2): 300 mg via ORAL
  Filled 2020-11-13 (×2): qty 1

## 2020-11-13 MED ORDER — POLYETHYLENE GLYCOL 3350 17 G PO PACK: 17.0000 g | PACK | Freq: Two times a day (BID) | ORAL | 0 refills | Status: DC | PRN

## 2020-11-13 MED ORDER — ACETAMINOPHEN 325 MG PO TABS: 650.0000 mg | ORAL_TABLET | Freq: Four times a day (QID) | ORAL | 1 refills | Status: DC | PRN

## 2020-11-13 MED ORDER — MELOXICAM 7.5 MG PO TABS
7.5000 mg | ORAL_TABLET | Freq: Every day | ORAL | Status: DC
  Administered 2020-11-13: 7.5 mg via ORAL
  Filled 2020-11-13: qty 1

## 2020-11-13 MED ORDER — SENNOSIDES-DOCUSATE SODIUM 8.6-50 MG PO TABS: 2.0000 | ORAL_TABLET | Freq: Two times a day (BID) | ORAL | 3 refills | Status: DC | PRN

## 2020-11-13 NOTE — Discharge Summary (Addendum)
Name: [name] MRN: [id number] DOB: 05/03/1970 50 y.o. PCP: Marletta Lor, NP  Date of Admission: 11/12/2020  1:28 AM Date of Discharge: 11/13/2020 Attending Physician: Tyson Alias, *  Discharge Diagnosis: 1. Acute Kidney Injury secondary to obstruction due to stool impaction 2. Hypokalemia   Discharge Medications: Allergies as of 11/13/2020       Reactions   Tizanidine Diarrhea   Tomato         Medication List     STOP taking these medications    doxycycline 100 MG tablet Commonly known as: VIBRA-TABS   ibuprofen 200 MG tablet Commonly known as: ADVIL   levofloxacin 500 MG tablet Commonly known as: LEVAQUIN       TAKE these medications    acetaminophen 325 MG tablet Commonly known as: TYLENOL Take 2 tablets (650 mg total) by mouth every 6 (six) hours as needed for mild pain (or Fever >/= 101).   baclofen 20 MG tablet Commonly known as: LIORESAL Take 20 mg by mouth 3 (three) times daily.   carvedilol 3.125 MG tablet Commonly known as: COREG Take 1 tablet (3.125 mg total) by mouth 2 (two) times daily with a meal.   collagenase ointment Commonly known as: SANTYL Apply topically daily.   feeding supplement Liqd Take 237 mLs by mouth 2 (two) times daily between meals.   furosemide 20 MG tablet Commonly known as: LASIX Take 40 mg by mouth daily.   gabapentin 300 MG capsule Commonly known as: NEURONTIN Take 300 mg by mouth See admin instructions. 300 mg  morning, noon and dinner 600 mg at bedtime.   meloxicam 7.5 MG tablet Commonly known as: MOBIC Take 7.5 mg by mouth daily.   polyethylene glycol 17 g packet Commonly known as: MIRALAX / GLYCOLAX Take 17 g by mouth 2 (two) times daily as needed for moderate constipation or mild constipation.   senna-docusate 8.6-50 MG tablet Commonly known as: Senokot-S Take 2 tablets by mouth 2 (two) times daily as needed for mild constipation.        Disposition and follow-up:   [name]  [name] was discharged from [facility] in Stable condition.  At the hospital follow up visit please address:  1.  Chronic constipation- was discharged with miralax and senna and instructed to try to have one bowel movement daily.  Patient complained of leg pain bilaterally during hospitalization.  This is most likely due to immobility and contractures.  She would benefit from home health physical therapy to help with contractures.  She also states that she does not currently follow with neurology due to not being able to go to the office.  Not currently on any treatment for her multiple sclerosis.  2.  Labs / imaging needed at time of follow-up: BMP  3.  Pending labs/ test needing follow-up: None  Follow-up Appointments:  Follow-up Information     Marletta Lor, NP Follow up in 1 week(s).   Specialty: Nurse Practitioner Contact information: [facility] [address] [place] [address] [phone]                 Hospital Course by problem list: 1.  Acute kidney injury Patient presented with 3-day history of diffuse abdominal pain and vomiting.  CT of the abdomen showed severe constipation with impaction of her ascending colon which caused mass-effect on her bladder as well as a mild bilateral hydronephrosis.  She was also found to have potassium of  2.0 and a creatinine elevated to 1.5.  Stool impaction was relieved manually in the emergency department.  Acute kidney injury most likely due to obstruction secondary to large stool volume and dehydration due to 3-day history of vomiting and not tolerating food or liquids. Creatinine improved from 1.48-0.95 and K improved 2.0-->3.5 with supportive care including stool disimpaction, fluids, and K repletion.  Discharge patient with Senokot 2 tablets twice daily PRN and MiraLAX twice daily PRN.  Educated patient on goal of 1 bowel movement that was soft daily. 2.  Multiple sclerosis Patient  presents with 16-year history of multiple sclerosis.  She states that she has been bedbound for the past 7 years.  She does not currently follow with neurology due to not being able to transport to the office for visits.  She is seen by physicians through [facility].  Discharge Exam:   BP (!) 93/48 (BP Location: Right Arm)   Pulse 82   Temp 98 F (36.7 C) (Oral)   Resp 18   Ht 5\' 1"  (1.549 m)   Wt 81.8 kg   SpO2 98%   BMI 34.07 kg/m  Discharge exam:  General: Chronically ill-appearing middle-aged female, no distress HEENT: Normocephalic and atraumatic, conjunctiva normal CV: No murmurs, rubs or gallops.  Normal rate Pulmonary: Clear to auscultation bilaterally, normal pulmonary effort GI: No tenderness, bowel sounds present GU: Pure wick in place, fecal management system in place MSK: Contractures present in feet bilaterally with increased tone, muscle atrophy of upper extremities present Skin: Warm and dry, sacrum with stage I pressure ulcer Psychiatric: Normal mood and affect    Pertinent Labs, Studies, and Procedures:  11/12/20 CT abd IMPRESSION: 1. Rectal stool impaction. Mass effect on the bladder causes mild bilateral hydroureteronephrosis. 2. Cirrhosis. 11/12/20 01/12/21 Abd IMPRESSION: 1.  Cholelithiasis.  Borderline gallbladder wall thickening. 2. Appearance of the liver in keeping with history of cirrhosis. No new focal lesion identified.   CBC Latest Ref Rng & Units 11/13/2020 11/12/2020 01/27/2019  WBC 4.0 - 10.5 K/uL 8.7 9.7 8.9  Hemoglobin 12.0 - 15.0 g/dL 01/29/2019 58.8 11.6(L)  Hematocrit 36.0 - 46.0 % 40.1 43.2 36.0  Platelets 150 - 400 K/uL 163 154 230    BMP Latest Ref Rng & Units 11/13/2020 11/12/2020 11/12/2020  Glucose 70 - 99 mg/dL 94 90 01/12/2021)  BUN 6 - 20 mg/dL 774(J) 28(N) 86(V)  Creatinine 0.44 - 1.00 mg/dL 672(C 9.47) 0.96(G)  Sodium 135 - 145 mmol/L 136 138 136  Potassium 3.5 - 5.1 mmol/L 3.5 2.7(LL) 2.0(LL)  Chloride 98 - 111 mmol/L 108 105 97(L)   CO2 22 - 32 mmol/L 22 26 27   Calcium 8.9 - 10.3 mg/dL 7.8(L) 8.0(L) 8.5(L)     Discharge Instructions: Discharge Instructions     Diet - low sodium heart healthy   Complete by: As directed    Discharge instructions   Complete by: As directed    [name], You were admitted to the hospital due to severe constipation that lead to you having a kidney injury.  Your constipation has been relieved and your labs for your kidneys have improved.  I am sending in [place] and Miralax for you to take to help prevent this from happening again.  Take up to 2 tablets of sennakot daily and you can take miralax up to 2 times a day.  Goal is to have a bowel movement that is soft once a day.  Please call your primary care physician to follow-up after your  hospitalization.  Thank you for letting us take care of you.   Increase activity slowly   Complete by: As directed        Signed: Rudene Christians, DO 11/13/2020, 12:12 PM   Pager: [phone]

## 2020-11-13 NOTE — TOC Transition Note (Signed)
Transition of Care [facility]) - CM/SW Discharge Note   Patient Details  Name: [name] MRN: [id number] Date of Birth: [birth year]  [facility] CM/SW Contact:  Durenda Guthrie, RN Phone Number: 11/13/2020, 10:06 AM   Clinical Narrative:  Case manger spoke with patient's mom, [name] [phone], concerning patient's discharge. PTAR has been scheduled for noon pickup. Mom is appreciative of call, has questions for nursing, CM asked Bedside RN to contact her. Medical Necessity Form has been completed. Patient has on going home care services in place. No further needs identified.          Patient Goals and CMS Choice        Discharge Placement                       Discharge Plan and Services                                     Social Determinants of Health (SDOH) Interventions     Readmission Risk Interventions No flowsheet data found.

## 2020-11-13 NOTE — Care Management CC44 (Signed)
Condition Code 44 Documentation Completed  Patient Details  Name: [name] MRN: [id number] Date of Birth: [birth year]   Condition Code 44 given:  Yes Patient signature on Condition Code 44 notice:  Yes (mother gave permission) Documentation of 2 MD's agreement:  Yes Code 44 added to claim:  Yes    Durenda Guthrie, RN 11/13/2020, 10:54 AM

## 2020-11-13 NOTE — Care Management Obs Status (Signed)
MEDICARE OBSERVATION STATUS NOTIFICATION   Patient Details  Name: [name] MRN: [id number] Date of Birth: October 22, 1970   Medicare Observation Status Notification Given:  Yes    Durenda Guthrie, RN 11/13/2020, 10:55 AM

## 2020-11-13 NOTE — Progress Notes (Signed)
OT Cancellation Note  Patient Details Name: [name] MRN: [id number] DOB: August 11, 1970   Cancelled Treatment:    Reason Eval/Treat Not Completed: OT screened, no needs identified. Patient is currently functioning at her baseline. PTA pateint was bedbound and requiring assistance for all ADLs grossly with the exception of self-feeding and dressing reporting wearing open backed nightgowns for ease donning/doffing.  Patient has 2 days/wk. Hoyer lift for functional transfers.   Kallie Edward OTR/L Supplemental OT, Department of rehab services [phone]  [place]oma Gotwalt R H. 11/13/2020, 9:38 AM

## 2020-11-13 NOTE — Progress Notes (Signed)
Patient left by EMS, no belongings brought other than patient gown which was put back on her at patients request.  Patient education discussed with patient and copy given to EMS personel

## 2020-11-15 LAB — URINE CULTURE: Culture: >=100000 — AB

## 2020-11-27 DIAGNOSIS — L89899 Pressure ulcer of other site, unspecified stage: Secondary | ICD-10-CM | POA: Diagnosis not present

## 2020-12-02 DIAGNOSIS — I10 Essential (primary) hypertension: Secondary | ICD-10-CM | POA: Diagnosis not present

## 2020-12-02 DIAGNOSIS — Z791 Long term (current) use of non-steroidal anti-inflammatories (NSAID): Secondary | ICD-10-CM | POA: Diagnosis not present

## 2020-12-02 DIAGNOSIS — Z9181 History of falling: Secondary | ICD-10-CM | POA: Diagnosis not present

## 2020-12-02 DIAGNOSIS — G8220 Paraplegia, unspecified: Secondary | ICD-10-CM | POA: Diagnosis not present

## 2020-12-02 DIAGNOSIS — L89322 Pressure ulcer of left buttock, stage 2: Secondary | ICD-10-CM | POA: Diagnosis not present

## 2020-12-02 DIAGNOSIS — G8929 Other chronic pain: Secondary | ICD-10-CM | POA: Diagnosis not present

## 2020-12-02 DIAGNOSIS — R32 Unspecified urinary incontinence: Secondary | ICD-10-CM | POA: Diagnosis not present

## 2020-12-02 DIAGNOSIS — K5901 Slow transit constipation: Secondary | ICD-10-CM | POA: Diagnosis not present

## 2020-12-02 DIAGNOSIS — G35 Multiple sclerosis: Secondary | ICD-10-CM | POA: Diagnosis not present

## 2020-12-05 DIAGNOSIS — I10 Essential (primary) hypertension: Secondary | ICD-10-CM | POA: Diagnosis not present

## 2020-12-05 DIAGNOSIS — L89322 Pressure ulcer of left buttock, stage 2: Secondary | ICD-10-CM | POA: Diagnosis not present

## 2020-12-05 DIAGNOSIS — G8220 Paraplegia, unspecified: Secondary | ICD-10-CM | POA: Diagnosis not present

## 2020-12-05 DIAGNOSIS — G8929 Other chronic pain: Secondary | ICD-10-CM | POA: Diagnosis not present

## 2020-12-05 DIAGNOSIS — G35 Multiple sclerosis: Secondary | ICD-10-CM | POA: Diagnosis not present

## 2020-12-05 DIAGNOSIS — Z9181 History of falling: Secondary | ICD-10-CM | POA: Diagnosis not present

## 2020-12-05 DIAGNOSIS — R32 Unspecified urinary incontinence: Secondary | ICD-10-CM | POA: Diagnosis not present

## 2020-12-05 DIAGNOSIS — K5901 Slow transit constipation: Secondary | ICD-10-CM | POA: Diagnosis not present

## 2020-12-05 DIAGNOSIS — Z791 Long term (current) use of non-steroidal anti-inflammatories (NSAID): Secondary | ICD-10-CM | POA: Diagnosis not present

## 2020-12-12 ENCOUNTER — Other Ambulatory Visit: Payer: Self-pay

## 2020-12-12 ENCOUNTER — Inpatient Hospital Stay (HOSPITAL_COMMUNITY)
Admission: EM | Admit: 2020-12-12 | Discharge: 2020-12-25 | DRG: 593 | Disposition: A | Discharge status: Skilled Nursing Facility | Payer: Medicare PPO | Attending: Internal Medicine | Admitting: Internal Medicine

## 2020-12-12 ENCOUNTER — Encounter (HOSPITAL_COMMUNITY): Payer: Self-pay | Admitting: Emergency Medicine

## 2020-12-12 ENCOUNTER — Observation Stay (HOSPITAL_COMMUNITY): Payer: Medicare PPO

## 2020-12-12 DIAGNOSIS — Z23 Encounter for immunization: Secondary | ICD-10-CM

## 2020-12-12 DIAGNOSIS — Z79899 Other long term (current) drug therapy: Secondary | ICD-10-CM | POA: Diagnosis not present

## 2020-12-12 DIAGNOSIS — L89159 Pressure ulcer of sacral region, unspecified stage: Secondary | ICD-10-CM

## 2020-12-12 DIAGNOSIS — L89322 Pressure ulcer of left buttock, stage 2: Secondary | ICD-10-CM | POA: Diagnosis not present

## 2020-12-12 DIAGNOSIS — R197 Diarrhea, unspecified: Secondary | ICD-10-CM

## 2020-12-12 DIAGNOSIS — M8618 Other acute osteomyelitis, other site: Secondary | ICD-10-CM | POA: Diagnosis present

## 2020-12-12 DIAGNOSIS — G8929 Other chronic pain: Secondary | ICD-10-CM | POA: Diagnosis present

## 2020-12-12 DIAGNOSIS — Z20822 Contact with and (suspected) exposure to covid-19: Secondary | ICD-10-CM | POA: Diagnosis present

## 2020-12-12 DIAGNOSIS — K5901 Slow transit constipation: Secondary | ICD-10-CM | POA: Diagnosis not present

## 2020-12-12 DIAGNOSIS — Z888 Allergy status to other drugs, medicaments and biological substances status: Secondary | ICD-10-CM

## 2020-12-12 DIAGNOSIS — B999 Unspecified infectious disease: Secondary | ICD-10-CM | POA: Diagnosis not present

## 2020-12-12 DIAGNOSIS — M6281 Muscle weakness (generalized): Secondary | ICD-10-CM | POA: Diagnosis not present

## 2020-12-12 DIAGNOSIS — T402X5A Adverse effect of other opioids, initial encounter: Secondary | ICD-10-CM | POA: Diagnosis not present

## 2020-12-12 DIAGNOSIS — L8995 Pressure ulcer of unspecified site, unstageable: Secondary | ICD-10-CM | POA: Diagnosis present

## 2020-12-12 DIAGNOSIS — M1990 Unspecified osteoarthritis, unspecified site: Secondary | ICD-10-CM | POA: Diagnosis present

## 2020-12-12 DIAGNOSIS — K5903 Drug induced constipation: Secondary | ICD-10-CM | POA: Diagnosis not present

## 2020-12-12 DIAGNOSIS — R1311 Dysphagia, oral phase: Secondary | ICD-10-CM | POA: Diagnosis not present

## 2020-12-12 DIAGNOSIS — R1110 Vomiting, unspecified: Secondary | ICD-10-CM | POA: Diagnosis not present

## 2020-12-12 DIAGNOSIS — L8990 Pressure ulcer of unspecified site, unspecified stage: Secondary | ICD-10-CM | POA: Diagnosis present

## 2020-12-12 DIAGNOSIS — Z7401 Bed confinement status: Secondary | ICD-10-CM

## 2020-12-12 DIAGNOSIS — R278 Other lack of coordination: Secondary | ICD-10-CM | POA: Diagnosis not present

## 2020-12-12 DIAGNOSIS — K567 Ileus, unspecified: Secondary | ICD-10-CM | POA: Diagnosis present

## 2020-12-12 DIAGNOSIS — M25562 Pain in left knee: Secondary | ICD-10-CM | POA: Diagnosis present

## 2020-12-12 DIAGNOSIS — R531 Weakness: Secondary | ICD-10-CM | POA: Diagnosis not present

## 2020-12-12 DIAGNOSIS — I10 Essential (primary) hypertension: Secondary | ICD-10-CM | POA: Diagnosis present

## 2020-12-12 DIAGNOSIS — N179 Acute kidney failure, unspecified: Secondary | ICD-10-CM | POA: Diagnosis present

## 2020-12-12 DIAGNOSIS — G35 Multiple sclerosis: Secondary | ICD-10-CM | POA: Diagnosis present

## 2020-12-12 DIAGNOSIS — Z8249 Family history of ischemic heart disease and other diseases of the circulatory system: Secondary | ICD-10-CM | POA: Diagnosis not present

## 2020-12-12 DIAGNOSIS — Z791 Long term (current) use of non-steroidal anti-inflammatories (NSAID): Secondary | ICD-10-CM | POA: Diagnosis not present

## 2020-12-12 DIAGNOSIS — M4628 Osteomyelitis of vertebra, sacral and sacrococcygeal region: Secondary | ICD-10-CM | POA: Diagnosis not present

## 2020-12-12 DIAGNOSIS — K56600 Partial intestinal obstruction, unspecified as to cause: Secondary | ICD-10-CM | POA: Diagnosis present

## 2020-12-12 DIAGNOSIS — N390 Urinary tract infection, site not specified: Secondary | ICD-10-CM

## 2020-12-12 DIAGNOSIS — Z91018 Allergy to other foods: Secondary | ICD-10-CM | POA: Diagnosis not present

## 2020-12-12 DIAGNOSIS — K5900 Constipation, unspecified: Secondary | ICD-10-CM

## 2020-12-12 DIAGNOSIS — Z87891 Personal history of nicotine dependence: Secondary | ICD-10-CM | POA: Diagnosis not present

## 2020-12-12 DIAGNOSIS — Z741 Need for assistance with personal care: Secondary | ICD-10-CM | POA: Diagnosis not present

## 2020-12-12 DIAGNOSIS — R600 Localized edema: Secondary | ICD-10-CM | POA: Diagnosis not present

## 2020-12-12 DIAGNOSIS — G8220 Paraplegia, unspecified: Secondary | ICD-10-CM | POA: Diagnosis not present

## 2020-12-12 DIAGNOSIS — N139 Obstructive and reflux uropathy, unspecified: Secondary | ICD-10-CM | POA: Diagnosis present

## 2020-12-12 DIAGNOSIS — Z9181 History of falling: Secondary | ICD-10-CM | POA: Diagnosis not present

## 2020-12-12 DIAGNOSIS — L089 Local infection of the skin and subcutaneous tissue, unspecified: Secondary | ICD-10-CM | POA: Diagnosis not present

## 2020-12-12 DIAGNOSIS — L89154 Pressure ulcer of sacral region, stage 4: Secondary | ICD-10-CM | POA: Diagnosis present

## 2020-12-12 DIAGNOSIS — L89609 Pressure ulcer of unspecified heel, unspecified stage: Secondary | ICD-10-CM | POA: Diagnosis not present

## 2020-12-12 DIAGNOSIS — L89314 Pressure ulcer of right buttock, stage 4: Secondary | ICD-10-CM | POA: Diagnosis not present

## 2020-12-12 DIAGNOSIS — R32 Unspecified urinary incontinence: Secondary | ICD-10-CM | POA: Diagnosis not present

## 2020-12-12 DIAGNOSIS — L89219 Pressure ulcer of right hip, unspecified stage: Secondary | ICD-10-CM | POA: Diagnosis not present

## 2020-12-12 LAB — URINALYSIS, ROUTINE W REFLEX MICROSCOPIC
Bilirubin Urine: NEGATIVE
Glucose, UA: NEGATIVE mg/dL
Ketones, ur: NEGATIVE mg/dL
Nitrite: POSITIVE — AB
Protein, ur: 30 mg/dL — AB
Specific Gravity, Urine: 1.008 (ref 1.005–1.030)
WBC, UA: >50 WBC/hpf — ABNORMAL HIGH (ref 0–5)
pH: 5 (ref 5.0–8.0)

## 2020-12-12 LAB — COMPREHENSIVE METABOLIC PANEL
ALT: 16 U/L (ref 0–44)
AST: 21 U/L (ref 15–41)
Albumin: 2.3 g/dL — ABNORMAL LOW (ref 3.5–5.0)
Alkaline Phosphatase: 77 U/L (ref 38–126)
Anion gap: 9 (ref 5–15)
BUN: 26 mg/dL — ABNORMAL HIGH (ref 6–20)
CO2: 22 mmol/L (ref 22–32)
Calcium: 8.8 mg/dL — ABNORMAL LOW (ref 8.9–10.3)
Chloride: 104 mmol/L (ref 98–111)
Creatinine, Ser: 0.77 mg/dL (ref 0.44–1.00)
GFR, Estimated: >60 mL/min (ref >60)
Glucose, Bld: 107 mg/dL — ABNORMAL HIGH (ref 70–99)
Potassium: 3.7 mmol/L (ref 3.5–5.1)
Sodium: 135 mmol/L (ref 135–145)
Total Bilirubin: 0.6 mg/dL (ref 0.3–1.2)
Total Protein: 6.7 g/dL (ref 6.5–8.1)

## 2020-12-12 LAB — CBC WITH DIFFERENTIAL/PLATELET
Abs Immature Granulocytes: 0.06 10*3/uL (ref 0.00–0.07)
Basophils Absolute: 0 10*3/uL (ref 0.0–0.1)
Basophils Relative: 0 %
Eosinophils Absolute: 0.2 10*3/uL (ref 0.0–0.5)
Eosinophils Relative: 2 %
HCT: 38.6 % (ref 36.0–46.0)
Hemoglobin: 12.6 g/dL (ref 12.0–15.0)
Immature Granulocytes: 1 %
Lymphocytes Relative: 19 %
Lymphs Abs: 2.6 10*3/uL (ref 0.7–4.0)
MCH: 29.6 pg (ref 26.0–34.0)
MCHC: 32.6 g/dL (ref 30.0–36.0)
MCV: 90.8 fL (ref 80.0–100.0)
Monocytes Absolute: 1.2 10*3/uL — ABNORMAL HIGH (ref 0.1–1.0)
Monocytes Relative: 9 %
Neutro Abs: 9.2 10*3/uL — ABNORMAL HIGH (ref 1.7–7.7)
Neutrophils Relative %: 69 %
Platelets: 215 10*3/uL (ref 150–400)
RBC: 4.25 MIL/uL (ref 3.87–5.11)
RDW: 15.7 % — ABNORMAL HIGH (ref 11.5–15.5)
WBC: 13.2 10*3/uL — ABNORMAL HIGH (ref 4.0–10.5)
nRBC: 0 % (ref 0.0–0.2)

## 2020-12-12 LAB — C-REACTIVE PROTEIN: CRP: 12.8 mg/dL — ABNORMAL HIGH (ref <1.0)

## 2020-12-12 LAB — LACTIC ACID, PLASMA
Lactic Acid, Venous: 1.3 mmol/L (ref 0.5–1.9)
Lactic Acid, Venous: 1.2 mmol/L (ref 0.5–1.9)

## 2020-12-12 LAB — RESP PANEL BY RT-PCR (FLU A&B, COVID) ARPGX2
Influenza A by PCR: NEGATIVE
Influenza B by PCR: NEGATIVE
SARS Coronavirus 2 by RT PCR: NEGATIVE

## 2020-12-12 LAB — TYPE AND SCREEN
ABO/RH(D): A POS
Antibody Screen: NEGATIVE

## 2020-12-12 LAB — SEDIMENTATION RATE: Sed Rate: 69 mm/hr — ABNORMAL HIGH (ref 0–22)

## 2020-12-12 MED ORDER — HEPARIN SODIUM (PORCINE) 5000 UNIT/ML IJ SOLN
5000.0000 [IU] | Freq: Three times a day (TID) | INTRAMUSCULAR | Status: DC
  Administered 2020-12-12 – 2020-12-25 (×37): 5000 [IU] via SUBCUTANEOUS
  Filled 2020-12-12 (×38): qty 1

## 2020-12-12 MED ORDER — ACETAMINOPHEN 325 MG PO TABS
650.0000 mg | ORAL_TABLET | Freq: Four times a day (QID) | ORAL | Status: DC
  Administered 2020-12-13 – 2020-12-20 (×29): 650 mg via ORAL
  Filled 2020-12-12 (×29): qty 2

## 2020-12-12 MED ORDER — MELOXICAM 7.5 MG PO TABS
7.5000 mg | ORAL_TABLET | Freq: Every day | ORAL | Status: DC
  Administered 2020-12-13: 7.5 mg via ORAL
  Filled 2020-12-12: qty 1

## 2020-12-12 MED ORDER — VANCOMYCIN HCL 1250 MG/250ML IV SOLN
1250.0000 mg | INTRAVENOUS | Status: DC
  Filled 2020-12-12: qty 250

## 2020-12-12 MED ORDER — BACLOFEN 20 MG PO TABS
20.0000 mg | ORAL_TABLET | Freq: Three times a day (TID) | ORAL | Status: DC
  Administered 2020-12-12 – 2020-12-25 (×36): 20 mg via ORAL
  Filled 2020-12-12 (×18): qty 1
  Filled 2020-12-12: qty 2
  Filled 2020-12-12 (×22): qty 1

## 2020-12-12 MED ORDER — GADOBUTROL 1 MMOL/ML IV SOLN
8.5000 mL | Freq: Once | INTRAVENOUS | Status: AC | PRN
  Administered 2020-12-12: 8.5 mL via INTRAVENOUS

## 2020-12-12 MED ORDER — ACETAMINOPHEN 650 MG RE SUPP: 650.0000 mg | Freq: Four times a day (QID) | RECTAL | Status: DC | PRN

## 2020-12-12 MED ORDER — PIPERACILLIN-TAZOBACTAM 3.375 G IVPB 30 MIN
3.3750 g | Freq: Once | INTRAVENOUS | Status: AC
  Administered 2020-12-12: 3.375 g via INTRAVENOUS
  Filled 2020-12-12: qty 50

## 2020-12-12 MED ORDER — BACLOFEN 20 MG PO TABS: 20.0000 mg | ORAL_TABLET | Freq: Three times a day (TID) | ORAL | Status: DC

## 2020-12-12 MED ORDER — ACETAMINOPHEN 325 MG PO TABS
650.0000 mg | ORAL_TABLET | Freq: Four times a day (QID) | ORAL | Status: DC | PRN
  Administered 2020-12-12: 650 mg via ORAL
  Filled 2020-12-12: qty 2

## 2020-12-12 MED ORDER — SODIUM CHLORIDE 0.9 % IV SOLN
2.0000 g | Freq: Three times a day (TID) | INTRAVENOUS | Status: DC
  Administered 2020-12-12 – 2020-12-14 (×5): 2 g via INTRAVENOUS
  Filled 2020-12-12 (×5): qty 2

## 2020-12-12 MED ORDER — GABAPENTIN 300 MG PO CAPS
300.0000 mg | ORAL_CAPSULE | Freq: Three times a day (TID) | ORAL | Status: DC
  Administered 2020-12-12: 300 mg via ORAL
  Filled 2020-12-12 (×2): qty 1

## 2020-12-12 MED ORDER — MELOXICAM 7.5 MG PO TABS
15.0000 mg | ORAL_TABLET | Freq: Once | ORAL | Status: AC
  Administered 2020-12-12: 15 mg via ORAL
  Filled 2020-12-12: qty 2

## 2020-12-12 MED ORDER — VANCOMYCIN HCL 1750 MG/350ML IV SOLN
1750.0000 mg | Freq: Once | INTRAVENOUS | Status: AC
  Administered 2020-12-12: 1750 mg via INTRAVENOUS
  Filled 2020-12-12: qty 350

## 2020-12-12 MED ORDER — FENTANYL CITRATE PF 50 MCG/ML IJ SOSY
12.5000 ug | PREFILLED_SYRINGE | Freq: Once | INTRAMUSCULAR | Status: AC
  Administered 2020-12-12: 12.5 ug via INTRAVENOUS
  Filled 2020-12-12: qty 1

## 2020-12-12 MED ORDER — MELOXICAM 7.5 MG PO TABS
7.5000 mg | ORAL_TABLET | Freq: Every day | ORAL | Status: DC
  Filled 2020-12-12: qty 1

## 2020-12-12 NOTE — ED Notes (Signed)
Admitting at bedside with RN. Wound to right buttock assessed. Drainage and redness noted. Pt does endorse pain when touched.   Pt is A&Ox4. But very lethargic. Eye opening with voice and touch. Pt will answer questions but will then fall back asleep. Admitting aware of this.

## 2020-12-12 NOTE — ED Triage Notes (Signed)
Pt arrives via EMS from home with worsening bed sore to sacral area. Hx of MS, pt bedbound and on hospital bed at home.

## 2020-12-12 NOTE — ED Provider Notes (Signed)
[facility] EMERGENCY DEPARTMENT Provider Note   CSN: [id number] Arrival date & time: 12/12/20  1325     History No chief complaint on file.   [name] is a 50 y.o. female.  50 year old female with prior medical history as detailed below presents for evaluation.  Patient is chronically bedbound.  She reports that she has a wound care nurse come to her house to check on a sacral decub.  Patient reports that the wound care nurse noted increased drainage and worsening of suspected infection of the sore.  She was sent to the ED for evaluation.  Patient denies other complaints such as fever, pain, nausea, vomiting.  The history is provided by the patient.  Illness Location:  Increased drainage from sacral decubitus Severity:  Moderate Onset quality:  Gradual Duration:  2 weeks Timing:  Constant Progression:  Worsening Chronicity:  Recurrent     Past Medical History:  Diagnosis Date   Arthritis    MS (multiple sclerosis) (HCC)     Patient Active Problem List   Diagnosis Date Noted   AKI (acute kidney injury) (HCC) 11/12/2020   Sacral osteomyelitis (HCC) 01/28/2019   Stage 4 decubitus ulcer (HCC) 01/25/2019   Cellulitis 01/25/2019   Decubitus skin ulcer 01/25/2019   Anxiety 05/13/2013   Essential hypertension, benign 04/25/2013   Encounter for long-term (current) use of other medications 04/25/2013   Constipation 04/09/2013   Displaced bimalleolar fracture of left ankle 03/30/2013   Multiple sclerosis (HCC) 08/25/2012   Chest pain 08/25/2012   Panic attack 08/25/2012    Past Surgical History:  Procedure Laterality Date   NO PAST SURGERIES     ORIF ANKLE FRACTURE Left 03/30/2013   Procedure: OPEN REDUCTION INTERNAL FIXATION (ORIF) LEFT ANKLE FRACTURE;  Surgeon: Toni Arthurs, MD;  Location: [facility] OR;  Service: Orthopedics;  Laterality: Left;     OB History   No obstetric history on file.     No family history on file.  Social History    Tobacco Use   Smoking status: Former    Packs/day: 2.00    Years: 20.00    Pack years: 40.00    Types: Cigarettes    Quit date: 07/03/2008    Years since quitting: 12.4   Smokeless tobacco: Former    Quit date: 01/10/2019   Tobacco comments:    USES ELECTRONIC CIGARETTES/ quit 01/2019  Substance Use Topics   Alcohol use: No   Drug use: No    Home Medications Prior to Admission medications   Medication Sig Start Date End Date Taking? Authorizing Provider  acetaminophen (TYLENOL) 325 MG tablet Take 2 tablets (650 mg total) by mouth every 6 (six) hours as needed for mild pain (or Fever >/= 101). 11/13/20   Masters, Katie, DO  baclofen (LIORESAL) 20 MG tablet Take 20 mg by mouth 3 (three) times daily. 11/05/18   [provider]  carvedilol (COREG) 3.125 MG tablet Take 1 tablet (3.125 mg total) by mouth 2 (two) times daily with a meal. 02/01/19 11/12/20  Dahal, Melina Schools, MD  collagenase (SANTYL) ointment Apply topically daily. Patient not taking: No sig reported 02/03/19   Lorin Glass, MD  feeding supplement, ENSURE ENLIVE, (ENSURE ENLIVE) LIQD Take 237 mLs by mouth 2 (two) times daily between meals. Patient not taking: No sig reported 02/01/19   Lorin Glass, MD  furosemide (LASIX) 20 MG tablet Take 40 mg by mouth daily. 06/26/20   [provider]  gabapentin (NEURONTIN) 300 MG capsule Take  300 mg by mouth See admin instructions. 300 mg  morning, noon and dinner 600 mg at bedtime. 12/14/18   [provider]  meloxicam (MOBIC) 7.5 MG tablet Take 7.5 mg by mouth daily. 11/05/18   [provider]  polyethylene glycol (MIRALAX / GLYCOLAX) 17 g packet Take 17 g by mouth 2 (two) times daily as needed for moderate constipation or mild constipation. 11/13/20   Masters, Katie, DO  senna-docusate (SENOKOT-S) 8.6-50 MG tablet Take 2 tablets by mouth 2 (two) times daily as needed for mild constipation. 11/13/20   Masters, Katie, DO    Allergies    Tizanidine and  Tomato  Review of Systems   Review of Systems  All other systems reviewed and are negative.  Physical Exam Updated Vital Signs BP 96/62   Pulse 76   Temp 98.2 F (36.8 C) (Oral)   Resp 17   SpO2 97%   Physical Exam Vitals and nursing note reviewed.  Constitutional:      General: She is not in acute distress.    Appearance: Normal appearance. She is well-developed.  HENT:     Head: Normocephalic and atraumatic.  Eyes:     Conjunctiva/sclera: Conjunctivae normal.     Pupils: Pupils are equal, round, and reactive to light.  Cardiovascular:     Rate and Rhythm: Normal rate and regular rhythm.     Heart sounds: Normal heart sounds.  Pulmonary:     Effort: Pulmonary effort is normal. No respiratory distress.     Breath sounds: Normal breath sounds.  Abdominal:     General: There is no distension.     Palpations: Abdomen is soft.     Tenderness: There is no abdominal tenderness.  Musculoskeletal:        General: No deformity. Normal range of motion.     Cervical back: Normal range of motion and neck supple.  Skin:    General: Skin is warm and dry.     Comments: Sacral decubitus present along the right sacrum.  Image below.  Frank pus and foul smell present on exam.  Neurological:     General: No focal deficit present.     Mental Status: She is alert and oriented to person, place, and time.       ED Results / Procedures / Treatments   Labs (all labs ordered are listed, but only abnormal results are displayed) Labs Reviewed  CULTURE, BLOOD (ROUTINE X 2)  CULTURE, BLOOD (ROUTINE X 2)  AEROBIC CULTURE W GRAM STAIN (SUPERFICIAL SPECIMEN)  RESP PANEL BY RT-PCR (FLU A&B, COVID) ARPGX2  COMPREHENSIVE METABOLIC PANEL  CBC WITH DIFFERENTIAL/PLATELET  LACTIC ACID, PLASMA  LACTIC ACID, PLASMA  URINALYSIS, ROUTINE W REFLEX MICROSCOPIC  TYPE AND SCREEN    EKG None  Radiology No results found.  Procedures Procedures   Medications Ordered in ED Medications - No  data to display  ED Course  I have reviewed the triage vital signs and the nursing notes.  Pertinent labs & imaging results that were available during my care of the patient were reviewed by me and considered in my medical decision making (see chart for details).    MDM Rules/Calculators/A&P                           MDM  MSE complete  [name] was evaluated in Emergency Department on 12/12/2020 for the symptoms described in the history of present illness. She was evaluated in  the context of the global COVID-19 pandemic, which necessitated consideration that the patient might be at risk for infection with the SARS-CoV-2 virus that causes COVID-19. Institutional protocols and algorithms that pertain to the evaluation of patients at risk for COVID-19 are in a state of rapid change based on information released by regulatory bodies including the CDC and federal and state organizations. These policies and algorithms were followed during the patient's care in the ED.  Patient presents for evaluation of likely infected sacral decub ulcer.  She reports increased drainage over two weeks.   Exam demonstrates frank purulence and foul odor from sacral decub.   Broad spectrum Abx initiated in ED.   Patient has hx of prior infected sacral decub (on left) which lead to sacral osteomyelitis.   Patient would benefit from admission for further workup and treatment.    Final Clinical Impression(s) / ED Diagnoses Final diagnoses:  Pressure injury of skin of sacral region, unspecified injury stage    Rx / DC Orders ED Discharge Orders     None        Wynetta Fines, MD 12/12/20 1456

## 2020-12-12 NOTE — ED Notes (Signed)
Pt remains in MRI 

## 2020-12-12 NOTE — Progress Notes (Addendum)
Pharmacy Antibiotic Note  [name] is a 50 y.o. female admitted on 12/12/2020 with  osteomyelitis .  Pharmacy has been consulted for cefepime dosing.  Patient with a history of multiple sclerosis for the past 16 years, complicated by severe debility, bedbound since 2015, and hypertension. Patient presenting with increased drainage and worsening of suspected infection of known sacral decubitus ulcer. Patient received one dose of vancomycin and zosyn in ED.  WBC 13.2; LA 1.3; Afebrile  Plan: Vancomycin 1750 mg once then 1250 mg q24h (eAUC 500) unless change in renal function Cefepime 2g q8h Trend WBC, renal function, clinical status F/u cultures De-escalate when able  Height: 5\' 1"  (154.9 cm) Weight: 86.2 kg (190 lb) IBW/kg (Calculated) : 47.8  Temp (24hrs), Avg:98.2 F (36.8 C), Min:98.2 F (36.8 C), Max:98.2 F (36.8 C)  Recent Labs  Lab 12/12/20 1343 12/12/20 1345  WBC 13.2*  --   CREATININE 0.77  --   LATICACIDVEN  --  1.3    Estimated Creatinine Clearance: 82.1 mL/min (by C-G formula based on SCr of 0.77 mg/dL).    Allergies  Allergen Reactions   Tizanidine Diarrhea   Tomato    Antimicrobials this admission: Zosyn x 1 in ED 10/4 Cefepime 10/4 >>  Vancomycin 10/4 >>  Microbiology results: Pending  Thank you for allowing pharmacy to be a part of this patient's care.  12/4, PharmD, BCPS 12/12/2020 5:42 PM ED Clinical Pharmacist -  [phone]

## 2020-12-12 NOTE — H&P (Signed)
Date: 12/12/2020               Patient Name:  [name] MRN: [id number]  DOB: February 12, 1971 Age / Sex: 50 y.o., female   PCP: Marletta Lor, NP         Medical Service: Internal Medicine Teaching Service         Attending Physician: Miguel Aschoff, MD    First Contact: Princess Bruins, DO Pager: [phone]  Second Contact: Verdene Lennert, MD Pager: [phone]       After Hours (After 5p/  First Contact Pager: [phone]  weekends / holidays): Second Contact Pager: [phone]   SUBJECTIVE  Chief Complaint: Right gluteal wound   History of Present Illness:  [name] is a 50 y.o. female, with pertinent PMHx of multiple sclerosis for the past 16 years, complicated by severe debility, bedbound since 2015, and hypertension presents to [facility] (12/12/2020) for worsening right gluteal ulcer.  She was recently admitted to our service with acute renal failure, severe hypokalemia, due to constipation and stool impaction, about 1 month ago.  On initial encounter, patient was drowsy, having a hard time staying awake during interview. Patient stated that she initially felt pain about 4 days ago, and that is progressively getting worse. Endorsed concurrent urinary urgency, denied frequency or dysuria. Stated she has a history of pressure ulcer. She denied fevers or chills. [name] states she lives at home and her parents help her with the ulcers on her back side. States that she has been bedbound since about 2015.  Stated that she has not seen her PCP from [facility] in a long time, due to shortage in the area where she lives. Stated that she has been utilizing telemedicine.  She also endorses loose stools since last discharge.  Discussed with patient that it may be due to her home bowel regiment and that it may be modified.  Acute on chronic knee pain, start 2 days ago.  Stated that it is sharp in nature.  She denied trauma. Medial aspect.only hurts when the knee is being  extended.  No pain on flexion, or palpation.  No results found for: ETH     Meds:  Current Outpatient Medications  Medication Instructions   acetaminophen (TYLENOL) 650 mg, Oral, Every 6 hours PRN   baclofen (LIORESAL) 20 mg, Oral, 3 times daily   carvedilol (COREG) 3.125 mg, Oral, 2 times daily with meals   collagenase (SANTYL) ointment Topical, Daily   feeding supplement, ENSURE ENLIVE, (ENSURE ENLIVE) LIQD 237 mLs, Oral, 2 times daily between meals   furosemide (LASIX) 40 mg, Oral, Daily   gabapentin (NEURONTIN) 300 mg, Oral, See admin instructions, 300 mg  morning, noon and dinner<BR>600 mg at bedtime.   meloxicam (MOBIC) 7.5 mg, Oral, Daily   polyethylene glycol (MIRALAX / GLYCOLAX) 17 g, Oral, 2 times daily PRN   senna-docusate (SENOKOT-S) 8.6-50 MG tablet 2 tablets, Oral, 2 times daily PRN     Allergies: Allergies as of 12/12/2020 - Review Complete 12/12/2020  Allergen Reaction Noted   Tizanidine Diarrhea 01/25/2019   Tomato  01/27/2019    Past Medical History: Past Medical History:  Diagnosis Date   Arthritis    MS (multiple sclerosis) (HCC)    Past Surgical History:  Procedure Laterality Date   NO PAST SURGERIES     ORIF ANKLE FRACTURE Left 03/30/2013   Procedure: OPEN REDUCTION INTERNAL FIXATION (ORIF) LEFT ANKLE FRACTURE;  Surgeon: Toni Arthurs, MD;  Location:  [facility] OR;  Service: Orthopedics;  Laterality: Left;    Family History: Father - heart disease, diabetes Mother - hypertension Sister - hypertension  Social History: Level of function -Assistance for ADLs Lives -home with parents Support -parent PCP - [facility] making house Substance use - She reports prior history of tobacco use of 1.5-2 ppd for 25 years; quit 11 years ago. She denies any history of alcohol use or illicit drug use.  Social History   Tobacco Use   Smoking status: Former    Packs/day: 2.00    Years: 20.00    Pack years: 40.00    Types: Cigarettes    Quit date: 07/03/2008    Years  since quitting: 12.4   Smokeless tobacco: Former    Quit date: 01/10/2019   Tobacco comments:    USES ELECTRONIC CIGARETTES/ quit 01/2019  Substance Use Topics   Alcohol use: No   Drug use: No     OBJECTIVE:  Review of Systems: A complete ROS was negative except as per HPI.   Physical Exam: Blood pressure 126/74, pulse 80, temperature 98.2 F (36.8 C), temperature source Oral, resp. rate 12, height 5\' 1"  (1.549 m), weight 86.2 kg, SpO2 97 %. Physical Exam Vitals and nursing note reviewed.  Constitutional:      Appearance: She is obese. She is ill-appearing.     Comments: Drowsy during interview, had a hard time keeping her eyes open.  HENT:     Head: Normocephalic and atraumatic.  Cardiovascular:     Rate and Rhythm: Normal rate and regular rhythm.     Pulses: Normal pulses.     Heart sounds: Normal heart sounds.  Pulmonary:     Effort: Pulmonary effort is normal.     Breath sounds: Normal breath sounds.  Abdominal:     Palpations: Abdomen is soft.     Tenderness: There is no abdominal tenderness.  Genitourinary:    Comments: Large ulcer wound on right glutes that is about 2cmx3cm. There is suppuration, multiple areas indurations surrounding main lesion, mass of granulation tissue that is mobile within the wound. Painful on palpation. Musculoskeletal:     Cervical back: Normal range of motion.  Skin:    General: Skin is warm and dry.  Neurological:     Mental Status: She is alert and oriented to person, place, and time.    Pertinent Labs: CBC    Component Value Date/Time   WBC 13.2 (H) 12/12/2020 1343   RBC 4.25 12/12/2020 1343   HGB 12.6 12/12/2020 1343   HCT 38.6 12/12/2020 1343   PLT 215 12/12/2020 1343   MCV 90.8 12/12/2020 1343   MCH 29.6 12/12/2020 1343   MCHC 32.6 12/12/2020 1343   RDW 15.7 (H) 12/12/2020 1343   LYMPHSABS 2.6 12/12/2020 1343   MONOABS 1.2 (H) 12/12/2020 1343   EOSABS 0.2 12/12/2020 1343   BASOSABS 0.0 12/12/2020 1343     CMP      Component Value Date/Time   NA 135 12/12/2020 1343   K 3.7 12/12/2020 1343   CL 104 12/12/2020 1343   CO2 22 12/12/2020 1343   GLUCOSE 107 (H) 12/12/2020 1343   BUN 26 (H) 12/12/2020 1343   CREATININE 0.77 12/12/2020 1343   CALCIUM 8.8 (L) 12/12/2020 1343   PROT 6.7 12/12/2020 1343   ALBUMIN 2.3 (L) 12/12/2020 1343   AST 21 12/12/2020 1343   ALT 16 12/12/2020 1343   ALKPHOS 77 12/12/2020 1343   BILITOT 0.6 12/12/2020 1343   GFRNONAA >  60 12/12/2020 1343   GFRAA >60 02/02/2019 0539    Pertinent Imaging: MR PELVIS W WO CONTRAST  Result Date: 12/12/2020 CLINICAL DATA:  Sacral decubitus ulcer. EXAM: MRI PELVIS WITHOUT AND WITH CONTRAST TECHNIQUE: Multiplanar multisequence MR imaging of the pelvis was performed both before and after administration of intravenous contrast. CONTRAST:  8.75mL GADAVIST GADOBUTROL 1 MMOL/ML IV SOLN COMPARISON:  CT abdomen pelvis dated November 12, 2020. MRI sacrum dated January 27, 2019. FINDINGS: Bones/Joint/Cartilage Abnormal marrow edema and enhancement with corresponding decreased T1 marrow signal involving the right ischial tuberosity no fracture or dislocation. Chronically eroded distal sacrum and coccyx, unchanged. No hip joint effusion. Muscles and Tendons Diffuse pelvic muscle atrophy. Soft tissue Deep right ischial decubitus ulcer extending to bone. No fluid collection. Reactive right inguinal lymph nodes. IMPRESSION: 1. Deep right ischial decubitus ulcer extending to bone with underlying osteomyelitis of the right ischial tuberosity. No abscess. Electronically Signed   By: Obie Dredge M.D.   On: 12/12/2020 20:04    ASSESSMENT & PLAN:  Assessment & Plan by Problem: Principal Problem:   Decubitus ulcer, unstageable with infection (HCC) Active Problems:   UTI (urinary tract infection)  #Acute osteomyelitis 2/2 right decubitus ulcer 2/2 to being chronically bed-bound due to her MS. Currently she is afebrile, leukocytosis, and hypotensive. Concern  for sepsis, osteomyelitis, and possible sinus tract formation from the multiple areas of induration surrounding.  Lactic acid within normal limits.  MRI, CRP at 12.8, Sed rate at 69 confirms osteomyelitis. We will obtain wound culture and blood culture for sensitivity.  Will start broad-spectrum antibiotics, will considering narrowing down after sensitivities.  We will consult ortho in the morning. -Follow-up blood in her wound culture -Trend CBC -IV vancomycin and cefepime -Consult ordered in the morning  #Symptomatic UTI UA was positive for nitrites, leukocytes, white blood cells, and many bacteria.  She endorsed urinary frequency, denied any other symptoms of UTI. Ordered urine culture. -Continue IV cefepime and vancomycin as above -Follow-up urine culture  Dispo: Admit patient to Inpatient with expected length of stay greater than 2 midnights.  Signed: Princess Bruins, DO Psychiatry Resident, PGY-1 [name] Teaching Service Pager: [phone] 8:21 PM, 12/12/2020   After 5pm on weekdays and 1pm on weekends: On Call pager: [phone]

## 2020-12-13 DIAGNOSIS — L8990 Pressure ulcer of unspecified site, unspecified stage: Secondary | ICD-10-CM | POA: Diagnosis present

## 2020-12-13 DIAGNOSIS — L89154 Pressure ulcer of sacral region, stage 4: Secondary | ICD-10-CM | POA: Diagnosis present

## 2020-12-13 DIAGNOSIS — I10 Essential (primary) hypertension: Secondary | ICD-10-CM | POA: Diagnosis present

## 2020-12-13 DIAGNOSIS — T402X5A Adverse effect of other opioids, initial encounter: Secondary | ICD-10-CM | POA: Diagnosis not present

## 2020-12-13 DIAGNOSIS — M8618 Other acute osteomyelitis, other site: Secondary | ICD-10-CM | POA: Diagnosis present

## 2020-12-13 DIAGNOSIS — Z888 Allergy status to other drugs, medicaments and biological substances status: Secondary | ICD-10-CM | POA: Diagnosis not present

## 2020-12-13 DIAGNOSIS — M1990 Unspecified osteoarthritis, unspecified site: Secondary | ICD-10-CM | POA: Diagnosis present

## 2020-12-13 DIAGNOSIS — Z7401 Bed confinement status: Secondary | ICD-10-CM | POA: Diagnosis not present

## 2020-12-13 DIAGNOSIS — Z791 Long term (current) use of non-steroidal anti-inflammatories (NSAID): Secondary | ICD-10-CM | POA: Diagnosis not present

## 2020-12-13 DIAGNOSIS — K567 Ileus, unspecified: Secondary | ICD-10-CM | POA: Diagnosis present

## 2020-12-13 DIAGNOSIS — Z20822 Contact with and (suspected) exposure to covid-19: Secondary | ICD-10-CM | POA: Diagnosis present

## 2020-12-13 DIAGNOSIS — L89314 Pressure ulcer of right buttock, stage 4: Secondary | ICD-10-CM | POA: Diagnosis not present

## 2020-12-13 DIAGNOSIS — N139 Obstructive and reflux uropathy, unspecified: Secondary | ICD-10-CM | POA: Diagnosis present

## 2020-12-13 DIAGNOSIS — Z23 Encounter for immunization: Secondary | ICD-10-CM | POA: Diagnosis not present

## 2020-12-13 DIAGNOSIS — Z8249 Family history of ischemic heart disease and other diseases of the circulatory system: Secondary | ICD-10-CM | POA: Diagnosis not present

## 2020-12-13 DIAGNOSIS — Z91018 Allergy to other foods: Secondary | ICD-10-CM | POA: Diagnosis not present

## 2020-12-13 DIAGNOSIS — N390 Urinary tract infection, site not specified: Secondary | ICD-10-CM | POA: Diagnosis present

## 2020-12-13 DIAGNOSIS — Z79899 Other long term (current) drug therapy: Secondary | ICD-10-CM | POA: Diagnosis not present

## 2020-12-13 DIAGNOSIS — K5903 Drug induced constipation: Secondary | ICD-10-CM | POA: Diagnosis not present

## 2020-12-13 DIAGNOSIS — G35 Multiple sclerosis: Secondary | ICD-10-CM | POA: Diagnosis present

## 2020-12-13 DIAGNOSIS — Z87891 Personal history of nicotine dependence: Secondary | ICD-10-CM | POA: Diagnosis not present

## 2020-12-13 DIAGNOSIS — L089 Local infection of the skin and subcutaneous tissue, unspecified: Secondary | ICD-10-CM | POA: Diagnosis not present

## 2020-12-13 DIAGNOSIS — M25562 Pain in left knee: Secondary | ICD-10-CM | POA: Diagnosis present

## 2020-12-13 DIAGNOSIS — G8929 Other chronic pain: Secondary | ICD-10-CM | POA: Diagnosis present

## 2020-12-13 DIAGNOSIS — K56600 Partial intestinal obstruction, unspecified as to cause: Secondary | ICD-10-CM | POA: Diagnosis present

## 2020-12-13 DIAGNOSIS — L8995 Pressure ulcer of unspecified site, unstageable: Secondary | ICD-10-CM | POA: Diagnosis not present

## 2020-12-13 DIAGNOSIS — N179 Acute kidney failure, unspecified: Secondary | ICD-10-CM | POA: Diagnosis present

## 2020-12-13 LAB — CBC WITH DIFFERENTIAL/PLATELET
Abs Immature Granulocytes: 0.05 10*3/uL (ref 0.00–0.07)
Basophils Absolute: 0 10*3/uL (ref 0.0–0.1)
Basophils Relative: 0 %
Eosinophils Absolute: 0.2 10*3/uL (ref 0.0–0.5)
Eosinophils Relative: 2 %
HCT: 37.8 % (ref 36.0–46.0)
Hemoglobin: 11.8 g/dL — ABNORMAL LOW (ref 12.0–15.0)
Immature Granulocytes: 1 %
Lymphocytes Relative: 21 %
Lymphs Abs: 2.2 10*3/uL (ref 0.7–4.0)
MCH: 28.9 pg (ref 26.0–34.0)
MCHC: 31.2 g/dL (ref 30.0–36.0)
MCV: 92.4 fL (ref 80.0–100.0)
Monocytes Absolute: 0.9 10*3/uL (ref 0.1–1.0)
Monocytes Relative: 9 %
Neutro Abs: 6.9 10*3/uL (ref 1.7–7.7)
Neutrophils Relative %: 67 %
Platelets: 231 10*3/uL (ref 150–400)
RBC: 4.09 MIL/uL (ref 3.87–5.11)
RDW: 15.7 % — ABNORMAL HIGH (ref 11.5–15.5)
WBC: 10.3 10*3/uL (ref 4.0–10.5)
nRBC: 0 % (ref 0.0–0.2)

## 2020-12-13 LAB — BASIC METABOLIC PANEL
Anion gap: 10 (ref 5–15)
BUN: 24 mg/dL — ABNORMAL HIGH (ref 6–20)
CO2: 21 mmol/L — ABNORMAL LOW (ref 22–32)
Calcium: 8.6 mg/dL — ABNORMAL LOW (ref 8.9–10.3)
Chloride: 102 mmol/L (ref 98–111)
Creatinine, Ser: 1.06 mg/dL — ABNORMAL HIGH (ref 0.44–1.00)
GFR, Estimated: >60 mL/min (ref >60)
Glucose, Bld: 111 mg/dL — ABNORMAL HIGH (ref 70–99)
Potassium: 3.7 mmol/L (ref 3.5–5.1)
Sodium: 133 mmol/L — ABNORMAL LOW (ref 135–145)

## 2020-12-13 LAB — ABO/RH: ABO/RH(D): A POS

## 2020-12-13 MED ORDER — HYDROMORPHONE HCL 1 MG/ML IJ SOLN
0.5000 mg | INTRAMUSCULAR | Status: DC | PRN
Start: 2020-12-13 — End: 2020-12-14
  Administered 2020-12-13 – 2020-12-14 (×5): 0.5 mg via INTRAVENOUS
  Filled 2020-12-13 (×5): qty 1

## 2020-12-13 MED ORDER — ZINC OXIDE 12.8 % EX OINT
TOPICAL_OINTMENT | Freq: Two times a day (BID) | CUTANEOUS | Status: DC
  Administered 2020-12-16 – 2020-12-24 (×3): 1 via TOPICAL
  Filled 2020-12-13 (×3): qty 56.7

## 2020-12-13 MED ORDER — VANCOMYCIN HCL 750 MG/150ML IV SOLN
750.0000 mg | INTRAVENOUS | Status: DC
  Administered 2020-12-13: 750 mg via INTRAVENOUS
  Filled 2020-12-13 (×2): qty 150

## 2020-12-13 MED ORDER — GABAPENTIN 300 MG PO CAPS
300.0000 mg | ORAL_CAPSULE | Freq: Two times a day (BID) | ORAL | Status: DC
  Administered 2020-12-13 – 2020-12-25 (×26): 300 mg via ORAL
  Filled 2020-12-13 (×25): qty 1

## 2020-12-13 MED ORDER — GABAPENTIN 300 MG PO CAPS
600.0000 mg | ORAL_CAPSULE | Freq: Every day | ORAL | Status: DC
  Administered 2020-12-14 – 2020-12-24 (×10): 600 mg via ORAL
  Filled 2020-12-13 (×10): qty 2

## 2020-12-13 MED ORDER — DAKINS (1/4 STRENGTH) 0.125 % EX SOLN
Freq: Two times a day (BID) | CUTANEOUS | Status: AC
  Administered 2020-12-16 – 2020-12-18 (×3): 1 via TOPICAL
  Filled 2020-12-13 (×2): qty 473

## 2020-12-13 NOTE — Consult Note (Signed)
WOC Nurse Consult Note: Patient receiving care in [facility] ED44. Reason for Consult: right decubitus ulcer, unstageable Wound type: Unstageable PI to right ischial area WITH ACUTE OSTEOMYELITIS confirmed by MRI yesterday. Pressure Injury POA: Yes Measurement: To be provided by the bedside RN in the flowsheet section  Wound bed: see photo Drainage (amount, consistency, odor) increased per patient over last short while Periwound: see photo Dressing procedure/placement/frequency: Moisten gauze with Dakin's place over/into sacral wound. Cover with ABD pad, tape in place.  I have also added an order for Triple Paste AROUND the wound twice daily.  And, turning instructions to avoid pressure to the sacral area due to the wound.  I spoke with Dr. Sharman Crate via telephone and explained I can put in topical care orders, but the patient has osteomyelitis that topical therapy will not resolve.  Monitor the wound area(s) for worsening of condition such as: Signs/symptoms of infection,  Increase in size,  Development of or worsening of odor, Development of pain, or increased pain at the affected locations.  Notify the medical team if any of these develop.  Thank you for the consult. WOC nurse will not follow at this time.  Please re-consult the WOC team if needed.  Helmut Muster, RN, MSN, CWOCN, CNS-BC, pager [phone]

## 2020-12-13 NOTE — Progress Notes (Signed)
HD#1 SUBJECTIVE:  Patient Summary: [name] is a 50 y.o. female, with pertinent PMHx of multiple sclerosis for the past 16 years, complicated by severe debility, bedbound since 2015, and hypertension presents to [facility] for worsening right gluteal ulcer and admitted for right ischial decubitus ulcer and osteomyelitis of the right ischial tuberosity.  Overnight Events: No acute events overnight.  Interim History: This is hospital day 1 for [name] who was seen and evaluated at the bedside this morning. She states she has been experiencing severe pain where her ulcer is, in addition to her left ankle where she has a previous injury. She is requesting her home Gabapentin and Baclofen be resumed. Discussed reasoning for holding these medications yesterday was secondary to the patient's lethargy and difficulty to arouse on exam. Today, the patient notes that since her hospitalization, she has had very loose stools that have leaked from her diaper likely from her heavy bowel regimen.   OBJECTIVE:  Vital Signs: Vitals:   12/13/20 0300 12/13/20 0400 12/13/20 0500 12/13/20 0600  BP: (!) 110/58 (!) 100/56 (!) 102/59 109/70  Pulse: 77 66 66 81  Resp:      Temp:      TempSrc:      SpO2: 96% 95% 99% 97%  Weight:      Height:       Supplemental O2: Room Air SpO2: 97 %  Filed Weights   12/12/20 1400 12/12/20 1431  Weight: 81.8 kg 86.2 kg     Intake/Output Summary (Last 24 hours) at 12/13/2020 0655 Last data filed at 12/13/2020 4010 Gross per 24 hour  Intake 200 ml  Output --  Net 200 ml   Net IO Since Admission: 200 mL [12/13/20 0655]  Physical Exam: General: Ill-appearing female laying in bed. No acute distress, but appears to be in pain.  CV: RRR. No murmurs, rubs, or gallops. No LE edema Pulmonary: Lungs CTAB. Normal effort. No wheezing or rales. Abdominal: Soft, nontender, nondistended. Normal bowel sounds. Genitourinary: Large ulcer on right gluteus ~2 cm x 3 cm  that is ~6 cm deep. There is suppuration and multiple areas of induration surrounding main lesion. Large abscess filled with purulent drainage, with what appears to be a gauze pad within it. Very tender on palpation. Normal DRE.  Extremities: Palpable radial and DP pulses.  Skin: Warm and dry. No obvious rash or lesions. Neuro: A&Ox3. Patient is bedbound 2/2 to MS. Normal sensation.  Psych: Normal mood and affect   Patient Lines/Drains/Airways Status     Active Line/Drains/Airways     Name Placement date Placement time Site Days   Peripheral IV 12/12/20 20 G Anterior;Proximal;Right Forearm 12/12/20  1343  Forearm  1   Incision 03/30/13 Leg Left 03/30/13  1332  -- 2815   Wound / Incision (Open or Dehisced) 01/26/19 Other (Comment) Buttocks Left Stg 4 01/26/19  0600  Buttocks  687   Wound / Incision (Open or Dehisced) 01/26/19 Buttocks Right Skin tear/stg 2 01/26/19  0600  Buttocks  687   Wound / Incision (Open or Dehisced) 01/26/19 Buttocks Right skin tear/ Stg 2 01/26/19  0600  Buttocks  687             ASSESSMENT/PLAN:  Assessment: Principal Problem:   Decubitus ulcer, unstageable with infection (HCC) Active Problems:   UTI (urinary tract infection)   Plan: #Acute osteomyelitis 2/2 right ischial decubitus ulcer 2/2 to being chronically bed-bound due to her MS. MRI significant for deep right  ischial decubitus ulcer extending to bone with underlying osteomyelitis of the right ischial tuberosity, no abscess noted. On exam today, an abscess appearing lesion was pulled out of the patient's ulcer (see media). The abscess was purulent and also appeared to have gauze in it. Wound was packed with gauze soaked in Dankin's solution. We will obtain wound culture and blood culture for sensitivity.  - Follow-up blood and wound culture - Trend CBC - Continue IV vancomycin and cefepime - Ortho consulted, appreciate recs - Wound care consulted, appreciate recs - Dilaudid 0.5 mg q4h PRN for  severe pain   #Symptomatic UTI UA was positive for nitrites, leukocytes, white blood cells, and many bacteria.  She endorsed urinary frequency, denied any other symptoms of UTI. Ordered urine culture. - Continue IV cefepime and vancomycin as above - Follow-up urine culture  #Multiple sclerosis - Resumed home baclofen and gabapentin  Best Practice: Diet: Regular diet IVF: Fluids: none VTE: heparin injection 5,000 Units Start: 12/12/20 1646 Code: Full AB: IV vanc/cefepime DISPO: Anticipated discharge to Home pending  clinical improvement .  Signature: Elza Rafter, D.O.  Internal Medicine Resident, PGY-1 [name]  Pager: [phone] 6:55 AM, 12/13/2020   Please contact the on call pager after 5 pm and on weekends at [phone].

## 2020-12-13 NOTE — ED Notes (Signed)
Med given 

## 2020-12-13 NOTE — Consult Note (Addendum)
Consulting physician: Aldean Baker, MD  Reason for Consult/CC: Right ischial pressure ulcer   HPI: The patient is a 50 year old female who was admitted to the hospital with a worsening right ischial pressure ulcer.  Subsequent evaluation in the emergency department also revealed a UTI.  Prior to me seeing the patient the right ischial pressure ulcer was debrided or separated on wound care.  She has quarter Dakin's at the bedside and her wound is packed with quarter Dakin's.  Plastic surgery was consulted for evaluation about management of this ischial pressure ulcer.  The patient is taking care of at home by her parents and a friend.  Allergies:  Allergies  Allergen Reactions   Silver    Tizanidine Diarrhea   Tomato     Medications:  Current Facility-Administered Medications:    acetaminophen (TYLENOL) tablet 650 mg, 650 mg, Oral, Q6H, Verdene Lennert, MD, 650 mg at 12/13/20 1450   baclofen (LIORESAL) tablet 20 mg, 20 mg, Oral, TID, Eduard Clos, MD, 20 mg at 12/13/20 1005   ceFEPIme (MAXIPIME) 2 g in sodium chloride 0.9 % 100 mL IVPB, 2 g, Intravenous, Q8H, Cathie Hoops, RPH, Stopped at 12/13/20 1351   gabapentin (NEURONTIN) capsule 300 mg, 300 mg, Oral, BID, 300 mg at 12/13/20 1005 **AND** gabapentin (NEURONTIN) capsule 600 mg, 600 mg, Oral, QHS, Verdene Lennert, MD   heparin injection 5,000 Units, 5,000 Units, Subcutaneous, Q8H, Verdene Lennert, MD, 5,000 Units at 12/13/20 1319   HYDROmorphone (DILAUDID) injection 0.5 mg, 0.5 mg, Intravenous, Q4H PRN, Verdene Lennert, MD   sodium hypochlorite (DAKIN'S 1/4 STRENGTH) topical solution, , Topical, BID, Miguel Aschoff, MD, Given at 12/13/20 1004   vancomycin (VANCOREADY) IVPB 750 mg/150 mL, 750 mg, Intravenous, Q24H, von Dohlen, Haley B, RPH, Last Rate: 150 mL/hr at 12/13/20 1438, 750 mg at 12/13/20 1438   Zinc Oxide (TRIPLE PASTE) 12.8 % ointment, , Topical, BID, Mayford Knife Dorene Ar, MD  Current Outpatient Medications:     acetaminophen (TYLENOL) 325 MG tablet, Take 2 tablets (650 mg total) by mouth every 6 (six) hours as needed for mild pain (or Fever >/= 101)., Disp: 30 tablet, Rfl: 1   baclofen (LIORESAL) 20 MG tablet, Take 20 mg by mouth 3 (three) times daily., Disp: , Rfl:    gabapentin (NEURONTIN) 300 MG capsule, Take 300 mg by mouth See admin instructions. 300 mg  morning, noon and dinner 600 mg at bedtime., Disp: , Rfl:    meloxicam (MOBIC) 7.5 MG tablet, Take 7.5 mg by mouth daily., Disp: , Rfl:    OVER THE COUNTER MEDICATION, Take 1 Scoop by mouth daily as needed (UTI symptoms). Over the counter powder, Disp: , Rfl:    polyethylene glycol (MIRALAX / GLYCOLAX) 17 g packet, Take 17 g by mouth 2 (two) times daily as needed for moderate constipation or mild constipation., Disp: 14 each, Rfl: 0   senna-docusate (SENOKOT-S) 8.6-50 MG tablet, Take 2 tablets by mouth 2 (two) times daily as needed for mild constipation., Disp: 60 tablet, Rfl: 3   carvedilol (COREG) 3.125 MG tablet, Take 1 tablet (3.125 mg total) by mouth 2 (two) times daily with a meal. (Patient not taking: Reported on 12/12/2020), Disp: 60 tablet, Rfl: 0   collagenase (SANTYL) ointment, Apply topically daily. (Patient not taking: No sig reported), Disp: 30 g, Rfl: 0   feeding supplement, ENSURE ENLIVE, (ENSURE ENLIVE) LIQD, Take 237 mLs by mouth 2 (two) times daily between meals. (Patient not taking: No sig reported), Disp: 237 mL, Rfl:  12  Past Medical History:  Diagnosis Date   Arthritis    MS (multiple sclerosis) (HCC)     Past Surgical History:  Procedure Laterality Date   NO PAST SURGERIES     ORIF ANKLE FRACTURE Left 03/30/2013   Procedure: OPEN REDUCTION INTERNAL FIXATION (ORIF) LEFT ANKLE FRACTURE;  Surgeon: Toni Arthurs, MD;  Location: [facility] OR;  Service: Orthopedics;  Laterality: Left;    No family history on file.  Social History:  reports that she quit smoking about 12 years ago. She has a 40.00 pack-year smoking history. She quit  smokeless tobacco use about 23 months ago. She reports that she does not drink alcohol and does not use drugs.  Physical Exam Blood pressure 116/76, pulse 82, temperature 98.5 F (36.9 C), temperature source Oral, resp. rate 18, height 5\' 1"  (1.549 m), weight 86.2 kg, SpO2 98 %. General: Alert, no acute distress Extremities: Right ischial pressure ulcer with minimal necrotic debris remaining, no evidence on physical exam of undrained fluid collection, ischial bone palpable at the base of the wound.  Results for orders placed or performed during the hospital encounter of 12/12/20 (from the past 48 hour(s))  Urinalysis, Routine w reflex microscopic     Status: Abnormal   Collection Time: 12/12/20  1:36 PM  Result Value Ref Range   Color, Urine YELLOW YELLOW   APPearance CLOUDY (A) CLEAR   Specific Gravity, Urine 1.008 1.005 - 1.030   pH 5.0 5.0 - 8.0   Glucose, UA NEGATIVE NEGATIVE mg/dL   Hgb urine dipstick MODERATE (A) NEGATIVE   Bilirubin Urine NEGATIVE NEGATIVE   Ketones, ur NEGATIVE NEGATIVE mg/dL   Protein, ur 30 (A) NEGATIVE mg/dL   Nitrite POSITIVE (A) NEGATIVE   Leukocytes,Ua LARGE (A) NEGATIVE   RBC / HPF 21-50 0 - 5 RBC/hpf   WBC, UA >50 (H) 0 - 5 WBC/hpf   Bacteria, UA MANY (A) NONE SEEN   Squamous Epithelial / LPF 11-20 0 - 5   WBC Clumps PRESENT    Mucus PRESENT     Comment: Performed at [facility], [address], [place], [place]  Comprehensive metabolic panel     Status: Abnormal   Collection Time: 12/12/20  1:43 PM  Result Value Ref Range   Sodium 135 135 - 145 mmol/L   Potassium 3.7 3.5 - 5.1 mmol/L   Chloride 104 98 - 111 mmol/L   CO2 22 22 - 32 mmol/L   Glucose, Bld 107 (H) 70 - 99 mg/dL    Comment: Glucose reference range applies only to samples taken after fasting for at least 8 hours.   BUN 26 (H) 6 - 20 mg/dL   Creatinine, Ser 02/11/21 0.44 - 1.00 mg/dL   Calcium 8.8 (L) 8.9 - 10.3 mg/dL   Total Protein 6.7 6.5 - 8.1 g/dL   Albumin 2.3  (L) 3.5 - 5.0 g/dL   AST 21 15 - 41 U/L   ALT 16 0 - 44 U/L   Alkaline Phosphatase 77 38 - 126 U/L   Total Bilirubin 0.6 0.3 - 1.2 mg/dL   GFR, Estimated 4.74 >25 mL/min    Comment: (NOTE) Calculated using the CKD-EPI Creatinine Equation (2021)    Anion gap 9 5 - 15    Comment: Performed at [facility], [address], [place], [place]  CBC with Differential     Status: Abnormal   Collection Time: 12/12/20  1:43 PM  Result Value Ref Range  WBC 13.2 (H) 4.0 - 10.5 K/uL   RBC 4.25 3.87 - 5.11 MIL/uL   Hemoglobin 12.6 12.0 - 15.0 g/dL   HCT 16.1 09.6 - 04.5 %   MCV 90.8 80.0 - 100.0 fL   MCH 29.6 26.0 - 34.0 pg   MCHC 32.6 30.0 - 36.0 g/dL   RDW 40.9 (H) 81.1 - 91.4 %   Platelets 215 150 - 400 K/uL   nRBC 0.0 0.0 - 0.2 %   Neutrophils Relative % 69 %   Neutro Abs 9.2 (H) 1.7 - 7.7 K/uL   Lymphocytes Relative 19 %   Lymphs Abs 2.6 0.7 - 4.0 K/uL   Monocytes Relative 9 %   Monocytes Absolute 1.2 (H) 0.1 - 1.0 K/uL   Eosinophils Relative 2 %   Eosinophils Absolute 0.2 0.0 - 0.5 K/uL   Basophils Relative 0 %   Basophils Absolute 0.0 0.0 - 0.1 K/uL   Immature Granulocytes 1 %   Abs Immature Granulocytes 0.06 0.00 - 0.07 K/uL    Comment: Performed at [facility], [address] [place], [place] [address]  Culture, blood (routine x 2)     Status: None (Preliminary result)   Collection Time: 12/12/20  1:43 PM   Specimen: BLOOD  Result Value Ref Range   Specimen Description BLOOD RIGHT ANTECUBITAL    Special Requests      BOTTLES DRAWN AEROBIC AND ANAEROBIC Blood Culture adequate volume   Culture      NO GROWTH < 24 HOURS Performed at [facility], [address] [place], [place] [address]    Report Status PENDING   Type and screen  [facility]     Status: None   Collection Time: 12/12/20  1:43 PM  Result Value Ref Range   ABO/RH(D) A POS    Antibody Screen NEG    Sample Expiration      12/15/2020,2359 Performed at [facility], [address] [place], [place] [address]   Lactic acid, plasma     Status: None   Collection Time: 12/12/20  1:45 PM  Result Value Ref Range   Lactic Acid, Venous 1.3 0.5 - 1.9 mmol/L    Comment: Performed at [facility], [address], [place], [place] [address]  Aerobic Culture w Gram Stain (superficial specimen)     Status: None (Preliminary result)   Collection Time: 12/12/20  2:22 PM   Specimen: Wound  Result Value Ref Range   Specimen Description WOUND DECUBITIS    Special Requests NONE    Gram Stain      ABUNDANT WBC PRESENT,BOTH PMN AND MONONUCLEAR FEW GRAM POSITIVE COCCI FEW GRAM NEGATIVE RODS RARE GRAM VARIABLE ROD    Culture      CULTURE REINCUBATED FOR BETTER GROWTH Performed at [facility], [address] [place], [place] [address]    Report Status PENDING   Resp Panel by RT-PCR (Flu A&B, Covid) Decubitis     Status: None   Collection Time: 12/12/20  2:22 PM   Specimen: Decubitis; Nasopharyngeal(NP) swabs in vial transport medium  Result Value Ref Range   SARS Coronavirus 2 by RT PCR NEGATIVE NEGATIVE    Comment: (NOTE) SARS-CoV-2 target nucleic acids are NOT DETECTED.  The SARS-CoV-2 RNA is generally detectable in upper respiratory specimens during the acute phase of infection. The lowest concentration of SARS-CoV-2 viral copies this assay can detect is 138 copies/mL. A negative result does not preclude SARS-Cov-2  infection and should not be used as the sole basis for treatment or other patient management decisions. A negative result may occur with  improper specimen collection/handling, submission of specimen other than nasopharyngeal swab, presence of viral mutation(s) within the areas targeted by this assay, and inadequate number of viral copies(<138 copies/mL). A negative result must be combined with clinical observations, patient history, and epidemiological information. The expected result is Negative.  Fact Sheet for  Patients:  BloggerCourse.com  Fact Sheet for Healthcare Providers:  SeriousBroker.it  This test is no t yet approved or cleared by the [place] FDA and  has been authorized for detection and/or diagnosis of SARS-CoV-2 by FDA under an Emergency Use Authorization (EUA). This EUA will remain  in effect (meaning this test can be used) for the duration of the COVID-19 declaration under Section 564(b)(1) of the Act, 21 U.S.C.section 360bbb-3(b)(1), unless the authorization is terminated  or revoked sooner.       Influenza A by PCR NEGATIVE NEGATIVE   Influenza B by PCR NEGATIVE NEGATIVE    Comment: (NOTE) The Xpert Xpress SARS-CoV-2/FLU/RSV plus assay is intended as an aid in the diagnosis of influenza from Nasopharyngeal swab specimens and should not be used as a sole basis for treatment. Nasal washings and aspirates are unacceptable for Xpert Xpress SARS-CoV-2/FLU/RSV testing.  Fact Sheet for Patients: BloggerCourse.com  Fact Sheet for Healthcare Providers: SeriousBroker.it  This test is not yet approved or cleared by the [place] FDA and has been authorized for detection and/or diagnosis of SARS-CoV-2 by FDA under an Emergency Use Authorization (EUA). This EUA will remain in effect (meaning this test can be used) for the duration of the COVID-19 declaration under Section 564(b)(1) of the Act, 21 U.S.C. section 360bbb-3(b)(1), unless the authorization is terminated or revoked.  Performed at [facility], [address], [place], [place] [address]   Culture, blood (routine x 2)     Status: None (Preliminary result)   Collection Time: 12/12/20  2:26 PM   Specimen: BLOOD LEFT HAND  Result Value Ref Range   Specimen Description BLOOD LEFT HAND    Special Requests      BOTTLES DRAWN AEROBIC AND ANAEROBIC Blood Culture results may not be optimal due to an inadequate  volume of blood received in culture bottles   Culture      NO GROWTH < 24 HOURS Performed at [facility], [address] [place], [place] [address]    Report Status PENDING   Lactic acid, plasma     Status: None   Collection Time: 12/12/20  3:35 PM  Result Value Ref Range   Lactic Acid, Venous 1.2 0.5 - 1.9 mmol/L    Comment: Performed at [facility], [address], [place], [place] [address]  C-reactive protein     Status: Abnormal   Collection Time: 12/12/20  5:03 PM  Result Value Ref Range   CRP 12.8 (H) <1.0 mg/dL    Comment: Performed at [facility], [address], [place], [place] [address]  Sedimentation rate     Status: Abnormal   Collection Time: 12/12/20  5:03 PM  Result Value Ref Range   Sed Rate 69 (H) 0 - 22 mm/hr    Comment: Performed at [facility], [address], [place], [place] [address]  CBC with Differential/Platelet     Status: Abnormal   Collection Time: 12/13/20  4:10 AM  Result Value Ref Range   WBC  10.3 4.0 - 10.5 K/uL   RBC 4.09 3.87 - 5.11 MIL/uL   Hemoglobin 11.8 (L) 12.0 - 15.0 g/dL   HCT 20.1 00.7 - 12.1 %   MCV 92.4 80.0 - 100.0 fL   MCH 28.9 26.0 - 34.0 pg   MCHC 31.2 30.0 - 36.0 g/dL   RDW 97.5 (H) 88.3 - 25.4 %   Platelets 231 150 - 400 K/uL   nRBC 0.0 0.0 - 0.2 %   Neutrophils Relative % 67 %   Neutro Abs 6.9 1.7 - 7.7 K/uL   Lymphocytes Relative 21 %   Lymphs Abs 2.2 0.7 - 4.0 K/uL   Monocytes Relative 9 %   Monocytes Absolute 0.9 0.1 - 1.0 K/uL   Eosinophils Relative 2 %   Eosinophils Absolute 0.2 0.0 - 0.5 K/uL   Basophils Relative 0 %   Basophils Absolute 0.0 0.0 - 0.1 K/uL   Immature Granulocytes 1 %   Abs Immature Granulocytes 0.05 0.00 - 0.07 K/uL    Comment: Performed at [facility], [address] [place], [place] [address]  ABO/Rh     Status: None   Collection Time: 12/13/20  4:10 AM  Result Value Ref Range   ABO/RH(D)      A POS Performed at [facility], [address] [place], [place] [address]   Basic metabolic panel     Status: Abnormal   Collection Time: 12/13/20 11:30 AM  Result Value Ref Range   Sodium 133 (L) 135 - 145 mmol/L   Potassium 3.7 3.5 - 5.1 mmol/L   Chloride 102 98 - 111 mmol/L   CO2 21 (L) 22 - 32 mmol/L   Glucose, Bld 111 (H) 70 - 99 mg/dL    Comment: Glucose reference range applies only to samples taken after fasting for at least 8 hours.   BUN 24 (H) 6 - 20 mg/dL   Creatinine, Ser 9.40 (H) 0.44 - 1.00 mg/dL   Calcium 8.6 (L) 8.9 - 10.3 mg/dL   GFR, Estimated >76 >80 mL/min    Comment: (NOTE) Calculated using the CKD-EPI Creatinine Equation (2021)    Anion gap 10 5 - 15    Comment: Performed at [facility], [address], [place], [place] [address]    MR PELVIS W WO CONTRAST  Result Date: 12/12/2020 CLINICAL DATA:  Sacral decubitus ulcer. EXAM: MRI PELVIS WITHOUT AND WITH CONTRAST TECHNIQUE: Multiplanar multisequence MR imaging of the pelvis was performed both before and after administration of intravenous contrast. CONTRAST:  8.68mL GADAVIST GADOBUTROL 1 MMOL/ML IV SOLN COMPARISON:  CT abdomen pelvis dated November 12, 2020. MRI sacrum dated January 27, 2019. FINDINGS: Bones/Joint/Cartilage Abnormal marrow edema and enhancement with corresponding decreased T1 marrow signal involving the right ischial tuberosity no fracture or dislocation. Chronically eroded distal sacrum and coccyx, unchanged. No hip joint effusion. Muscles and Tendons Diffuse pelvic muscle atrophy. Soft tissue Deep right ischial decubitus ulcer extending to bone. No fluid collection. Reactive right inguinal lymph nodes. IMPRESSION: 1. Deep right ischial decubitus ulcer extending to bone with underlying osteomyelitis of the right ischial tuberosity. No abscess. Electronically Signed   By: Obie Dredge M.D.   On: 12/12/2020 20:04    Assessment/Plan: Patient has a chronic right ischial pressure ulcer.  Most likely she has also an acute UTI.  She does not  have any significant undrained fluid collection or remaining necrotic debris of her pressure ulcer on physical exam.  Its not surprising that she  has osteomyelitis given that bone is palpable at the base of the wound.  She is currently hemodynamically stable but if she does have any signs of sepsis I would think this will most likely be due to urinary tract infection.  I do not think this patient needs additional debridement of the soft tissue over pressure also at this time.  I do not think she is a flap candidate given maceration of surrounding skin.  Bank, cefepime seems reasonable for treatment of her UTI and I agree with quarter Dakin's for packing of her ischial wound tract.  I recommend initial follow-up in the wound care center.  Time based coding: 40 minutes of or time was spent with the patient including review of records and CT, MRI as well as evaluating the wound and discussing the patient's history. Janne Napoleon 12/13/2020, 3:15 PM

## 2020-12-13 NOTE — ED Notes (Signed)
Report given to stephanie

## 2020-12-13 NOTE — ED Notes (Signed)
Lunch ordered 

## 2020-12-13 NOTE — Progress Notes (Signed)
Pharmacy Antibiotic Note  [name] is a 50 y.o. female admitted on 12/12/2020 with  osteomyelitis .  Pharmacy has been consulted for cefepime/vancomycin dosing.  Patient with a history of multiple sclerosis for the past 16 years, complicated by severe debility, bedbound since 2015, and hypertension. Patient presenting with increased drainage and worsening of suspected infection of known sacral decubitus ulcer.  SCr bumped, 0.77>>1.06 today.  Plan: Reduce vancomycin to 750mg  IV q24h for worsening renal function. Goal AUC 400-550. Expected AUC: 402 SCr used: 1.06 Cefepime 2g IV q8h Monitor clinical progress, c/s, renal function F/u de-escalation plan/LOT, vancomycin levels as indicated Contacted MD to consider holding Mobic for now   Height: 5\' 1"  (154.9 cm) Weight: 86.2 kg (190 lb) IBW/kg (Calculated) : 47.8  Temp (24hrs), Avg:98.4 F (36.9 C), Min:98.2 F (36.8 C), Max:98.5 F (36.9 C)  Recent Labs  Lab 12/12/20 1343 12/12/20 1345 12/12/20 1535 12/13/20 0410 12/13/20 1130  WBC 13.2*  --   --  10.3  --   CREATININE 0.77  --   --   --  1.06*  LATICACIDVEN  --  1.3 1.2  --   --      Estimated Creatinine Clearance: 61.9 mL/min (A) (by C-G formula based on SCr of 1.06 mg/dL (H)).    Allergies  Allergen Reactions   Silver    Tizanidine Diarrhea   Tomato    Antimicrobials this admission: Zosyn x 1 in ED 10/4 Cefepime 10/4 >>  Vancomycin 10/4 >>  Microbiology results:   02/12/21, PharmD, BCPS Please check AMION for all [facility] contact numbers Clinical Pharmacist 12/13/2020 12:13 PM

## 2020-12-13 NOTE — ED Notes (Signed)
Placed Breakfast orders 

## 2020-12-14 DIAGNOSIS — L8995 Pressure ulcer of unspecified site, unstageable: Secondary | ICD-10-CM

## 2020-12-14 DIAGNOSIS — L089 Local infection of the skin and subcutaneous tissue, unspecified: Secondary | ICD-10-CM | POA: Diagnosis not present

## 2020-12-14 LAB — AEROBIC CULTURE W GRAM STAIN (SUPERFICIAL SPECIMEN)

## 2020-12-14 LAB — BASIC METABOLIC PANEL
Anion gap: 10 (ref 5–15)
BUN: 26 mg/dL — ABNORMAL HIGH (ref 6–20)
CO2: 22 mmol/L (ref 22–32)
Calcium: 8.9 mg/dL (ref 8.9–10.3)
Chloride: 103 mmol/L (ref 98–111)
Creatinine, Ser: 1.19 mg/dL — ABNORMAL HIGH (ref 0.44–1.00)
GFR, Estimated: 55 mL/min — ABNORMAL LOW (ref >60)
Glucose, Bld: 117 mg/dL — ABNORMAL HIGH (ref 70–99)
Potassium: 4.1 mmol/L (ref 3.5–5.1)
Sodium: 135 mmol/L (ref 135–145)

## 2020-12-14 LAB — CBC
HCT: 39.5 % (ref 36.0–46.0)
Hemoglobin: 12.7 g/dL (ref 12.0–15.0)
MCH: 29 pg (ref 26.0–34.0)
MCHC: 32.2 g/dL (ref 30.0–36.0)
MCV: 90.2 fL (ref 80.0–100.0)
Platelets: 215 10*3/uL (ref 150–400)
RBC: 4.38 MIL/uL (ref 3.87–5.11)
RDW: 15.5 % (ref 11.5–15.5)
WBC: 8.7 10*3/uL (ref 4.0–10.5)
nRBC: 0 % (ref 0.0–0.2)

## 2020-12-14 MED ORDER — AMOXICILLIN-POT CLAVULANATE 400-57 MG/5ML PO SUSR
800.0000 mg | Freq: Two times a day (BID) | ORAL | Status: DC
  Administered 2020-12-14 – 2020-12-25 (×22): 800 mg via ORAL
  Filled 2020-12-14 (×25): qty 10

## 2020-12-14 MED ORDER — OXYCODONE HCL 5 MG PO TABS
5.0000 mg | ORAL_TABLET | Freq: Four times a day (QID) | ORAL | Status: DC | PRN
  Administered 2020-12-15 – 2020-12-20 (×15): 10 mg via ORAL
  Filled 2020-12-14 (×16): qty 2

## 2020-12-14 MED ORDER — DOXYCYCLINE HYCLATE 100 MG PO TABS
100.0000 mg | ORAL_TABLET | Freq: Two times a day (BID) | ORAL | Status: DC
  Administered 2020-12-14 – 2020-12-20 (×13): 100 mg via ORAL
  Filled 2020-12-14 (×13): qty 1

## 2020-12-14 MED ORDER — POLYETHYLENE GLYCOL 3350 17 G PO PACK
17.0000 g | PACK | Freq: Every day | ORAL | Status: DC
  Administered 2020-12-15: 17 g via ORAL
  Filled 2020-12-14 (×2): qty 1

## 2020-12-14 MED ORDER — HYDROMORPHONE HCL 1 MG/ML IJ SOLN
0.5000 mg | INTRAMUSCULAR | Status: DC | PRN
  Administered 2020-12-14 – 2020-12-15 (×2): 0.5 mg via INTRAVENOUS
  Filled 2020-12-14 (×3): qty 1

## 2020-12-14 MED ORDER — POLYETHYLENE GLYCOL 3350 17 G PO PACK: 17.0000 g | PACK | Freq: Every day | ORAL | Status: DC | PRN

## 2020-12-14 MED ORDER — ONDANSETRON HCL 4 MG PO TABS
4.0000 mg | ORAL_TABLET | Freq: Three times a day (TID) | ORAL | Status: DC | PRN
  Administered 2020-12-14 – 2020-12-20 (×2): 4 mg via ORAL
  Filled 2020-12-14 (×2): qty 1

## 2020-12-14 NOTE — Progress Notes (Addendum)
HD 2 SUBJECTIVE:  Patient Summary: [name] is a 50 y.o. female, with pertinent PMHx of multiple sclerosis for the past 16 years, complicated by severe debility, bedbound since 2015, and hypertension presents to [facility] for worsening right gluteal ulcer and admitted for right ischial decubitus ulcer and osteomyelitis of the right ischial tuberosity.  Overnight Events: No acute events overnight.  Interim History: This is hospital day 2 for [name] who was seen and evaluated at the bedside this morning. Stated that she feels well, and that she slept fine. Endorsed feeling very thirsty, and that she did not have a bowel movement since admission.  Discussed with her that this is likely due to holding her home bowel regiment.  Discussed with her that we will restart about regimen.  She denies pain currently. Discussed disposition to SNF with patient, stated that she does not like SNF, but is amenable.  Denied SOB, CP, and any symptoms of UTI.  OBJECTIVE:  Vital Signs: Vitals:   12/14/20 0009 12/14/20 0419 12/14/20 0823 12/14/20 1100  BP:  128/65 (!) 159/86 125/70  Pulse:  82 95 96  Resp:  18 20 19   Temp:  98.2 F (36.8 C) 98 F (36.7 C) 98.3 F (36.8 C)  TempSrc:  Oral Oral Oral  SpO2:  98% 99% 97%  Weight: 72.9 kg     Height: 5\' 1"  (1.549 m)      Supplemental O2:  Room Air SpO2: 97 %  Filed Weights   12/12/20 1400 12/12/20 1431 12/14/20 0009  Weight: 81.8 kg 86.2 kg 72.9 kg     Intake/Output Summary (Last 24 hours) at 12/14/2020 1643 Last data filed at 12/14/2020 1300 Gross per 24 hour  Intake 737.38 ml  Output 400 ml  Net 337.38 ml   Net IO Since Admission: 629.81 mL [12/14/20 1643]  Physical Exam: General: Ill-appearing female laying in bed. No acute distress, not in pain. A&Ox4 CV: RRR. No murmurs, rubs, or gallops. No LE edema Pulmonary: Lungs CTAB. Normal effort. No wheezing or rales.  On room air Abdominal: Soft, nontender, nondistended.   Hyperactive bowel sounds.  No suprapubic tenderness. Extremities: Palpable radial and DP pulses.  Skin: Warm and dry. No obvious rash or lesions. Neuro: A&Ox3. Patient is bedbound 2/2 to MS. Normal sensation.  Psych: Normal mood and affect  Patient Lines/Drains/Airways Status     Active Line/Drains/Airways     Name Placement date Placement time Site Days   Peripheral IV 12/12/20 20 G Anterior;Proximal;Right Forearm 12/12/20  1343  Forearm  1   Incision 03/30/13 Leg Left 03/30/13  1332  -- 2815   Wound / Incision (Open or Dehisced) 01/26/19 Other (Comment) Buttocks Left Stg 4 01/26/19  0600  Buttocks  687   Wound / Incision (Open or Dehisced) 01/26/19 Buttocks Right Skin tear/stg 2 01/26/19  0600  Buttocks  687   Wound / Incision (Open or Dehisced) 01/26/19 Buttocks Right skin tear/ Stg 2 01/26/19  0600  Buttocks  687             ASSESSMENT/PLAN:  Assessment: Principal Problem:   Decubitus ulcer, unstageable with infection (HCC) Active Problems:   UTI (urinary tract infection)   Decubitus ulcer  Plan: #Acute osteomyelitis 2/2 right ischial pressure ulcer  2/2 to being chronically bed-bound due to her MS. right ischial tuberosity osteomyelitis as evident on MRI, no abscess noted.  Per ortho, patient is not a candidate for additional debridement or a flap candidate. Wound was  packed with gauze soaked in Dankin's solution. Pending wound culture and blood culture for sensitivity. On 10/6, no growth over 24 hours on wound and blood culture. She is afebrile and has no leukocytosis.  Per ID, transitioning patient to p.o. Augmentin every 12 hours for 28 doses and doxycycline 100 mg every 12 hours for 28 doses. Discontinue IV antibiotics. - Follow-up blood and wound culture - Trend CBC - Discontinue IV vancomycin and cefepime, per ID - Start Augmentin 800 mg every 12 hours for 28 doses (start 10/6) - Dilaudid 0.5 mg q4h PRN for severe pain - Wound care consulted, appreciate recs -  Infectious Disease consulted, appreciate recs - Ortho consulted, appreciate recs   #Symptomatic UTI Urine culture is significant for Citrobacter Freundii. Pending urine culture for sensitivity. - Continue antimicrobials as above - Follow-up culture sensitivity  #Multiple sclerosis - Resumed home baclofen and gabapentin - Holding home meloxicam in setting of AKI  Best Practice: Diet: Regular diet IVF: Fluids: none VTE: heparin injection 5,000 Units Start: 12/12/20 1646 Code: Full AB: Augmentin 800 mg every 12 hours for 28 doses and doxycycline 100 mg every 12 hours for 28 doses. (start 10/6)  DISPO: Anticipated discharge to SNF pending  clinical improvement .  Signature: Princess Bruins, DO Psychiatry Resident, PGY-1 [name] Teaching Service Pager: [phone]  4:43 PM, 12/14/2020   Please contact the on call pager after 5 pm and on weekends at [phone].

## 2020-12-14 NOTE — Progress Notes (Signed)
Received pt from ED. VSS. CHG bath done. R buttock wound re-dressed and measured, see flowsheet. Pt updated mother on phone of her room and plan of care. Report given to primary RN Sherrilyn Rist.  Margarito Liner, RN

## 2020-12-14 NOTE — Consult Note (Signed)
[facility]  Total days of antibiotics 3        Day 3 cefepime        Day 3 vancomycin               Reason for Consult:Right ischial decubitus ulcer with osteomyelitis of ischial tuberosity    Referring Physician: Charissa Bash, MD  Principal Problem:   Decubitus ulcer, unstageable with infection (HCC) Active Problems:   UTI (urinary tract infection)   Decubitus ulcer    HPI: [name] is a 50 y.o. female with MS for the past 16 years complicated by being bedbound since 2015, who presented to [facility] for worsening of her right gluteal ulcer. She states that she has had this pressure ulcer for a while and for the last 4 days prior to arrival, she noticed worsening of her pain at the site. She notes that she has not seen a physician in a while, due to her history of being bed bound and a physician shortage of doctors making house calls. She does have a wound care nurse who helps care for her ulcer, however, she is unsure the last time the wound was packed. Of note, an irregular mass of devitalized tissue ~3x2x6cm was removed from the wound yesterday and appeared to have gauze pads likely secondary to an old packing. Additionally, the patient states that she has had a lot of loose stools recently, which could likely be attributed to a heavy bowel regimen that she was started on at her last admission. She denies any fevers, chills, chest pain, shortness of breath, n/v, abd pain, dysuria, hematuria, or any other sxs at this time. The patient states that she has a history of getting these gluteal ulcers, and that her last infection also spread to the bone.  Past Medical History:  Diagnosis Date   Arthritis    MS (multiple sclerosis) (HCC)     Allergies:  Allergies  Allergen Reactions   Silver    Tizanidine Diarrhea   Tomato     Current antibiotics: Vancomycin and cefepime    MEDICATIONS:  acetaminophen  650 mg Oral Q6H   baclofen  20 mg Oral  TID   gabapentin  300 mg Oral BID   And   gabapentin  600 mg Oral QHS   heparin  5,000 Units Subcutaneous Q8H   sodium hypochlorite   Topical BID   Zinc Oxide   Topical BID    Social History   Tobacco Use   Smoking status: Former    Packs/day: 2.00    Years: 20.00    Pack years: 40.00    Types: Cigarettes    Quit date: 07/03/2008    Years since quitting: 12.4   Smokeless tobacco: Former    Quit date: 01/10/2019   Tobacco comments:    USES ELECTRONIC CIGARETTES/ quit 01/2019  Substance Use Topics   Alcohol use: No   Drug use: No    No family history on file.  Review of Systems -  Review of Systems  Constitutional:  Negative for chills and fever.  HENT: Negative.    Respiratory:  Negative for cough and shortness of breath.   Cardiovascular:  Positive for leg swelling. Negative for chest pain and palpitations.  Gastrointestinal:  Positive for diarrhea. Negative for abdominal pain, blood in stool, melena, nausea and vomiting.  Genitourinary:  Negative for dysuria and hematuria.  Musculoskeletal:  Positive for joint pain.  Neurological:  Negative for  dizziness and headaches.     OBJECTIVE: Temp:  [97.9 F (36.6 C)-98.4 F (36.9 C)] 98 F (36.7 C) (10/06 0823) Pulse Rate:  [81-95] 95 (10/06 0823) Resp:  [18-20] 20 (10/06 0823) BP: (115-159)/(59-86) 159/86 (10/06 0823) SpO2:  [95 %-99 %] 99 % (10/06 0823) Weight:  [72.9 kg] 72.9 kg (10/06 0009)  General: No acute distress, but appears to be in pain.  CV: RRR. No murmurs, rubs, or gallops. No LE edema Pulmonary: Lungs CTAB. Normal effort. No wheezing or rales. Abdominal: Soft, nontender, nondistended. Normal bowel sounds. Genitourinary: Large ulcer on right gluteus ~2 cm x 3 cm that is ~6 cm deep. There is suppuration and multiple areas of induration surrounding main lesion, wound is packed and dressed now. Very tender on palpation.  Extremities: Palpable radial and DP pulses.  Skin: Warm and dry. No obvious rash or  lesions. Neuro: A&Ox3. Patient is bedbound 2/2 to MS.   LABS: Results for orders placed or performed during the hospital encounter of 12/12/20 (from the past 48 hour(s))  Urine Culture     Status: Abnormal (Preliminary result)   Collection Time: 12/12/20  1:16 PM   Specimen: Urine, Clean Catch  Result Value Ref Range   Specimen Description URINE, CLEAN CATCH    Special Requests      NONE Performed at [facility], [address], [place], [place] [address]    Culture 80,000 COLONIES/mL GRAM NEGATIVE RODS (A)    Report Status PENDING   Urinalysis, Routine w reflex microscopic     Status: Abnormal   Collection Time: 12/12/20  1:36 PM  Result Value Ref Range   Color, Urine YELLOW YELLOW   APPearance CLOUDY (A) CLEAR   Specific Gravity, Urine 1.008 1.005 - 1.030   pH 5.0 5.0 - 8.0   Glucose, UA NEGATIVE NEGATIVE mg/dL   Hgb urine dipstick MODERATE (A) NEGATIVE   Bilirubin Urine NEGATIVE NEGATIVE   Ketones, ur NEGATIVE NEGATIVE mg/dL   Protein, ur 30 (A) NEGATIVE mg/dL   Nitrite POSITIVE (A) NEGATIVE   Leukocytes,Ua LARGE (A) NEGATIVE   RBC / HPF 21-50 0 - 5 RBC/hpf   WBC, UA >50 (H) 0 - 5 WBC/hpf   Bacteria, UA MANY (A) NONE SEEN   Squamous Epithelial / LPF 11-20 0 - 5   WBC Clumps PRESENT    Mucus PRESENT     Comment: Performed at [facility], [address], [place], [place] [address]  Comprehensive metabolic panel     Status: Abnormal   Collection Time: 12/12/20  1:43 PM  Result Value Ref Range   Sodium 135 135 - 145 mmol/L   Potassium 3.7 3.5 - 5.1 mmol/L   Chloride 104 98 - 111 mmol/L   CO2 22 22 - 32 mmol/L   Glucose, Bld 107 (H) 70 - 99 mg/dL    Comment: Glucose reference range applies only to samples taken after fasting for at least 8 hours.   BUN 26 (H) 6 - 20 mg/dL   Creatinine, Ser 8.65 0.44 - 1.00 mg/dL   Calcium 8.8 (L) 8.9 - 10.3 mg/dL   Total Protein 6.7 6.5 - 8.1 g/dL   Albumin 2.3 (L) 3.5 - 5.0 g/dL   AST 21 15 - 41 U/L   ALT 16 0 - 44 U/L    Alkaline Phosphatase 77 38 - 126 U/L   Total Bilirubin 0.6 0.3 - 1.2 mg/dL   GFR, Estimated >78 >46 mL/min    Comment: (NOTE)  Calculated using the CKD-EPI Creatinine Equation (2021)    Anion gap 9 5 - 15    Comment: Performed at [facility], [address] [place], [place] [address]  CBC with Differential     Status: Abnormal   Collection Time: 12/12/20  1:43 PM  Result Value Ref Range   WBC 13.2 (H) 4.0 - 10.5 K/uL   RBC 4.25 3.87 - 5.11 MIL/uL   Hemoglobin 12.6 12.0 - 15.0 g/dL   HCT 60.4 54.0 - 98.1 %   MCV 90.8 80.0 - 100.0 fL   MCH 29.6 26.0 - 34.0 pg   MCHC 32.6 30.0 - 36.0 g/dL   RDW 19.1 (H) 47.8 - 29.5 %   Platelets 215 150 - 400 K/uL   nRBC 0.0 0.0 - 0.2 %   Neutrophils Relative % 69 %   Neutro Abs 9.2 (H) 1.7 - 7.7 K/uL   Lymphocytes Relative 19 %   Lymphs Abs 2.6 0.7 - 4.0 K/uL   Monocytes Relative 9 %   Monocytes Absolute 1.2 (H) 0.1 - 1.0 K/uL   Eosinophils Relative 2 %   Eosinophils Absolute 0.2 0.0 - 0.5 K/uL   Basophils Relative 0 %   Basophils Absolute 0.0 0.0 - 0.1 K/uL   Immature Granulocytes 1 %   Abs Immature Granulocytes 0.06 0.00 - 0.07 K/uL    Comment: Performed at [facility], [address], [place], [place] [address]  Culture, blood (routine x 2)     Status: None (Preliminary result)   Collection Time: 12/12/20  1:43 PM   Specimen: BLOOD  Result Value Ref Range   Specimen Description BLOOD RIGHT ANTECUBITAL    Special Requests      BOTTLES DRAWN AEROBIC AND ANAEROBIC Blood Culture adequate volume   Culture      NO GROWTH < 24 HOURS Performed at [facility], [address] [place], [place] [address]    Report Status PENDING   Type and screen [facility]     Status: None   Collection Time: 12/12/20  1:43 PM  Result Value Ref Range   ABO/RH(D) A POS    Antibody Screen NEG    Sample Expiration      12/15/2020,2359 Performed at [facility], [address], [place], [place] [address]    Lactic acid, plasma     Status: None   Collection Time: 12/12/20  1:45 PM  Result Value Ref Range   Lactic Acid, Venous 1.3 0.5 - 1.9 mmol/L    Comment: Performed at [facility], [address] [place], [place] [address]  Aerobic Culture w Gram Stain (superficial specimen)     Status: None (Preliminary result)   Collection Time: 12/12/20  2:22 PM   Specimen: Wound  Result Value Ref Range   Specimen Description WOUND DECUBITIS    Special Requests NONE    Gram Stain      ABUNDANT WBC PRESENT,BOTH PMN AND MONONUCLEAR FEW GRAM POSITIVE COCCI FEW GRAM NEGATIVE RODS RARE GRAM VARIABLE ROD    Culture      CULTURE REINCUBATED FOR BETTER GROWTH Performed at [facility], [address], [place], [place] [address]    Report Status PENDING   Resp Panel by RT-PCR (Flu A&B, Covid) Decubitis     Status: None   Collection Time: 12/12/20  2:22 PM   Specimen: Decubitis; Nasopharyngeal(NP) swabs in vial transport medium  Result Value Ref Range  SARS Coronavirus 2 by RT PCR NEGATIVE NEGATIVE    Comment: (NOTE) SARS-CoV-2 target nucleic acids are NOT DETECTED.  The SARS-CoV-2 RNA is generally detectable in upper respiratory specimens during the acute phase of infection. The lowest concentration of SARS-CoV-2 viral copies this assay can detect is 138 copies/mL. A negative result does not preclude SARS-Cov-2 infection and should not be used as the sole basis for treatment or other patient management decisions. A negative result may occur with  improper specimen collection/handling, submission of specimen other than nasopharyngeal swab, presence of viral mutation(s) within the areas targeted by this assay, and inadequate number of viral copies(<138 copies/mL). A negative result must be combined with clinical observations, patient history, and epidemiological information. The expected result is Negative.  Fact Sheet for Patients:  BloggerCourse.com  Fact  Sheet for Healthcare Providers:  SeriousBroker.it  This test is no t yet approved or cleared by the [place] FDA and  has been authorized for detection and/or diagnosis of SARS-CoV-2 by FDA under an Emergency Use Authorization (EUA). This EUA will remain  in effect (meaning this test can be used) for the duration of the COVID-19 declaration under Section 564(b)(1) of the Act, 21 U.S.C.section 360bbb-3(b)(1), unless the authorization is terminated  or revoked sooner.       Influenza A by PCR NEGATIVE NEGATIVE   Influenza B by PCR NEGATIVE NEGATIVE    Comment: (NOTE) The Xpert Xpress SARS-CoV-2/FLU/RSV plus assay is intended as an aid in the diagnosis of influenza from Nasopharyngeal swab specimens and should not be used as a sole basis for treatment. Nasal washings and aspirates are unacceptable for Xpert Xpress SARS-CoV-2/FLU/RSV testing.  Fact Sheet for Patients: BloggerCourse.com  Fact Sheet for Healthcare Providers: SeriousBroker.it  This test is not yet approved or cleared by the [place] FDA and has been authorized for detection and/or diagnosis of SARS-CoV-2 by FDA under an Emergency Use Authorization (EUA). This EUA will remain in effect (meaning this test can be used) for the duration of the COVID-19 declaration under Section 564(b)(1) of the Act, 21 U.S.C. section 360bbb-3(b)(1), unless the authorization is terminated or revoked.  Performed at [facility], [address] [place], [place] [address]   Culture, blood (routine x 2)     Status: None (Preliminary result)   Collection Time: 12/12/20  2:26 PM   Specimen: BLOOD LEFT HAND  Result Value Ref Range   Specimen Description BLOOD LEFT HAND    Special Requests      BOTTLES DRAWN AEROBIC AND ANAEROBIC Blood Culture results may not be optimal due to an inadequate volume of blood received in culture bottles   Culture       NO GROWTH < 24 HOURS Performed at [facility], [address], [place], [place] [address]    Report Status PENDING   Lactic acid, plasma     Status: None   Collection Time: 12/12/20  3:35 PM  Result Value Ref Range   Lactic Acid, Venous 1.2 0.5 - 1.9 mmol/L    Comment: Performed at [facility], [address], [place], [place] [address]  C-reactive protein     Status: Abnormal   Collection Time: 12/12/20  5:03 PM  Result Value Ref Range   CRP 12.8 (H) <1.0 mg/dL    Comment: Performed at [facility], [address] [place], [place] [address]  Sedimentation rate     Status: Abnormal   Collection Time: 12/12/20  5:03 PM  Result Value Ref Range   Sed Rate 69 (H) 0 - 22 mm/hr    Comment: Performed at [facility], [address] [place], [place] [address]  CBC with Differential/Platelet     Status: Abnormal   Collection Time: 12/13/20  4:10 AM  Result Value Ref Range   WBC 10.3 4.0 - 10.5 K/uL   RBC 4.09 3.87 - 5.11 MIL/uL   Hemoglobin 11.8 (L) 12.0 - 15.0 g/dL   HCT 84.6 96.2 - 95.2 %   MCV 92.4 80.0 - 100.0 fL   MCH 28.9 26.0 - 34.0 pg   MCHC 31.2 30.0 - 36.0 g/dL   RDW 84.1 (H) 32.4 - 40.1 %   Platelets 231 150 - 400 K/uL   nRBC 0.0 0.0 - 0.2 %   Neutrophils Relative % 67 %   Neutro Abs 6.9 1.7 - 7.7 K/uL   Lymphocytes Relative 21 %   Lymphs Abs 2.2 0.7 - 4.0 K/uL   Monocytes Relative 9 %   Monocytes Absolute 0.9 0.1 - 1.0 K/uL   Eosinophils Relative 2 %   Eosinophils Absolute 0.2 0.0 - 0.5 K/uL   Basophils Relative 0 %   Basophils Absolute 0.0 0.0 - 0.1 K/uL   Immature Granulocytes 1 %   Abs Immature Granulocytes 0.05 0.00 - 0.07 K/uL    Comment: Performed at [facility], [address] [place], [place] [address]  ABO/Rh     Status: None   Collection Time: 12/13/20  4:10 AM  Result Value Ref Range   ABO/RH(D)      A POS Performed at [facility], [address], [place], [place] [address]   Basic metabolic panel     Status:  Abnormal   Collection Time: 12/13/20 11:30 AM  Result Value Ref Range   Sodium 133 (L) 135 - 145 mmol/L   Potassium 3.7 3.5 - 5.1 mmol/L   Chloride 102 98 - 111 mmol/L   CO2 21 (L) 22 - 32 mmol/L   Glucose, Bld 111 (H) 70 - 99 mg/dL    Comment: Glucose reference range applies only to samples taken after fasting for at least 8 hours.   BUN 24 (H) 6 - 20 mg/dL   Creatinine, Ser 0.34 (H) 0.44 - 1.00 mg/dL   Calcium 8.6 (L) 8.9 - 10.3 mg/dL   GFR, Estimated >74 >25 mL/min    Comment: (NOTE) Calculated using the CKD-EPI Creatinine Equation (2021)    Anion gap 10 5 - 15    Comment: Performed at [facility], [address], [place], [place] [address]  Basic metabolic panel     Status: Abnormal   Collection Time: 12/14/20  1:41 AM  Result Value Ref Range   Sodium 135 135 - 145 mmol/L   Potassium 4.1 3.5 - 5.1 mmol/L   Chloride 103 98 - 111 mmol/L   CO2 22 22 - 32 mmol/L   Glucose, Bld 117 (H) 70 - 99 mg/dL    Comment: Glucose reference range applies only to samples taken after fasting for at least 8 hours.   BUN 26 (H) 6 - 20 mg/dL   Creatinine, Ser 7.56 (H) 0.44 - 1.00 mg/dL   Calcium 8.9 8.9 - 43.3 mg/dL   GFR, Estimated 55 (L) >60 mL/min    Comment: (NOTE) Calculated using the CKD-EPI Creatinine Equation (2021)    Anion gap 10 5 - 15    Comment: Performed at [facility], [address] [place], [place] [address]  CBC     Status: None   Collection Time: 12/14/20  1:41 AM  Result Value Ref Range   WBC 8.7 4.0 - 10.5 K/uL   RBC 4.38 3.87 - 5.11 MIL/uL   Hemoglobin 12.7 12.0 - 15.0 g/dL   HCT 86.7 67.2 - 09.4 %   MCV 90.2 80.0 - 100.0 fL   MCH 29.0 26.0 - 34.0 pg   MCHC 32.2 30.0 - 36.0 g/dL   RDW 70.9 62.8 - 36.6 %   Platelets 215 150 - 400 K/uL   nRBC 0.0 0.0 - 0.2 %    Comment: Performed at [facility], [address] [place], [place] [address]    MICRO:  IMAGING: MR PELVIS W WO CONTRAST  Result Date: 12/12/2020 CLINICAL DATA:  Sacral  decubitus ulcer. EXAM: MRI PELVIS WITHOUT AND WITH CONTRAST TECHNIQUE: Multiplanar multisequence MR imaging of the pelvis was performed both before and after administration of intravenous contrast. CONTRAST:  8.42mL GADAVIST GADOBUTROL 1 MMOL/ML IV SOLN COMPARISON:  CT abdomen pelvis dated November 12, 2020. MRI sacrum dated January 27, 2019. FINDINGS: Bones/Joint/Cartilage Abnormal marrow edema and enhancement with corresponding decreased T1 marrow signal involving the right ischial tuberosity no fracture or dislocation. Chronically eroded distal sacrum and coccyx, unchanged. No hip joint effusion. Muscles and Tendons Diffuse pelvic muscle atrophy. Soft tissue Deep right ischial decubitus ulcer extending to bone. No fluid collection. Reactive right inguinal lymph nodes. IMPRESSION: 1. Deep right ischial decubitus ulcer extending to bone with underlying osteomyelitis of the right ischial tuberosity. No abscess. Electronically Signed   By: Obie Dredge M.D.   On: 12/12/2020 20:04    HISTORICAL MICRO/IMAGING  Assessment/Plan:  #Osteomyelitis secondary to right ischial decubitus ulcer MRI revealed deep right ischial decubitus ulcer extending to bone with underlying osteomyelitis of the right ischial tuberosity. Patient was initially started on IV vanc and cefepime. Her infection is likely multifactorial in nature, given the patient being bed bound, lack of appropriate wound care, and diarrhea/stool leaking into the wound.  - Switch IV vanc/cefepime to Augmentin 800 mg q12h and doxycycline 100 mg q12h (doxy can be crushed as patient is unable to swallow pills); would recommend therapy for about 2 weeks, as she is unable to leave her home and follow up in the ID clinic - Could extend abx therapy based on response - Reduce bowel regimen to decrease loose stool frequency

## 2020-12-14 NOTE — Hospital Course (Addendum)
Hospital Course by problem list: [name] is a 50 y.o. female, with pertinent PMHx of multiple sclerosis for the past 16 years, bedbound since 2015, severe chronic constipation, and hypertension presents to [facility] for worsening right gluteal ulcer and admitted for right ischial osteomyelitis 2/2 chronic pressure wound. LOS: 12 days  ED Course:  BP 96/62   Pulse 76   Temp 98.2 F (36.8 C) (Oral)   Resp 17   SpO2 97% RA Presented for increased drainage from pressure wound noted by home wound care RN. She was found to have osteomyelitis on right ischial tuberosity on imaging at the site of overlying pressure wound. Infectious disease and orthopedics were consulted.  Hospital Course: Patient was evaluated by orthopedics, plastics, and infectious disease. She was found to not be a surgical or skin flap candidate. Patient was started empiric IV cefepime and IV vancomycin for 1 day, then transitioned to PO Augmentin and doxycycline (start 10/6, end 10/20) for the remainder of hospitalization. She was thought to have a UTI, but repeat urine culture was negative for growth and she was asymptomatic.  Course was complicated by large stool burden resulting in partial SBO, that was resolved with bowel rest and escalating bowel regiment.   Consults: Orthopedic, Plastic, Infectious Disease   Plan:  #Acute Right Ischial Osteomyelitis 2/2 overlying pressure wound In the setting of being bed bound 2/2 MS, exacerbated by inadequate wound care and fecal contamination at home. On admission, there was found to be guaze found in the wound. Confirmed with MRI Orthopedic and plastic surgery did not recommend surgical intervention due to location and maceration of surrounding skin. Started on IV vancomycin and cefepime then ID recommend conservative management with PO antibiotics for now and follow-up with ID outpatient for reassessment. Recommended home therapy.  Barriers include improving nutrition  (albumin 2.3), avoidance of soiling due to location (may require diverting colostomy which was discussed with pt), regular and thorough wound cleaning, offloading wound. Recommended SNF for wound care due to inadequate wound care at home that involves her parents and an RN that comes 1x/week.  - IV Vancomycine and IV cefepime (start 10/5, end 10/6) - Augmentin 800 mg q12h for 2wks and doxycycline 100 mg q12h for 2wks (start 10/6, end 10/20); this can be extended based on response, per ID recs - SNF placement on 10/17 for additional wound care - Ensure in between meals and nutrition consult - Wound care twice daily per wound care nurse, and avoidance of contamination - Zinc oxide (triple paste) ointment - Offloading wound, with frequent turnings and air mattress - Outpatient follow up with ID within 1 week of dc from SNF for reassessment - Considered possible diverting colostomy to avoid wound contamination  #Chronic constipation  #Non-adhesive SBO v Partial Ileus #Encopresis New onset and sudden nausea with emesis x1 on 10/12, KUB revealed SBO 2/2 large stool burden from chronic opioids. Since admission and prior, patient endorsed chronic diarrhea. However there was discrepancy with patient reports and RN reports. Patient admits that it is difficult for her to characterize her bowel movements and stool. She was started on bowel regiment of Senokot 2 tablets daily and miralax PRN. Considered soap sud enema  and suppository if patient was not having adequate bowel movements. Please insure patient has adequate bowel movements daily, as patient was recently hospitalized 1 month prior for severe constipation that resulted in AKI.  She seems to have a fear of fecal incontinence and may be resisting bowel movements.  May need diverting colostomy due to frequency and nature of bowel movements.  Opioids were dc substituted for Tylenol 100mg  q6hr. Once large stool burden has resolved, please consider  naloxegal for opioid induced constipation.  Also concerned for contamination of pure wick leading to possible UTI. - Senokot 2 tables daily - Miralax PRN - Soap sud enema or suppository PRN if inadequate bowel movement from above regiment. - Consider possible diverting colostomy as stated above - Consider Naloxegal for opioid induced constipation  #Multiple sclerosis #Neuropathy #Chronic right knee pain Had to hold home meloxicam due to AKI which resolved. Restarted at 7.5mg  daily. Started daily Voltarean gel QID daily to affected painful joint. Continued her home baclofen and gabapentin. She did endorse worsening neuropathy on day of discharge, discussed with patient trying Cymbalta. All opioids were dc due to SBO, per above. This was substituted for Tylenol 1000mg  q6hr. - Home baclofen 20mg  TID - Home Gabapentin 300 qAM and qNOON, 600qHS - Meloxicam 7.5mg  PRN daily for pain, please limit to once daily for risk of AKI - Voltarean QID - Tylenol 1000mg  q6hr - Please consider Cymbalta for neuropathic pain.

## 2020-12-15 LAB — CBC WITH DIFFERENTIAL/PLATELET
Abs Immature Granulocytes: 0.06 10*3/uL (ref 0.00–0.07)
Basophils Absolute: 0 10*3/uL (ref 0.0–0.1)
Basophils Relative: 0 %
Eosinophils Absolute: 0.1 10*3/uL (ref 0.0–0.5)
Eosinophils Relative: 2 %
HCT: 36.9 % (ref 36.0–46.0)
Hemoglobin: 11.9 g/dL — ABNORMAL LOW (ref 12.0–15.0)
Immature Granulocytes: 1 %
Lymphocytes Relative: 34 %
Lymphs Abs: 2.4 10*3/uL (ref 0.7–4.0)
MCH: 29 pg (ref 26.0–34.0)
MCHC: 32.2 g/dL (ref 30.0–36.0)
MCV: 90 fL (ref 80.0–100.0)
Monocytes Absolute: 0.5 10*3/uL (ref 0.1–1.0)
Monocytes Relative: 8 %
Neutro Abs: 4 10*3/uL (ref 1.7–7.7)
Neutrophils Relative %: 55 %
Platelets: 232 10*3/uL (ref 150–400)
RBC: 4.1 MIL/uL (ref 3.87–5.11)
RDW: 15.7 % — ABNORMAL HIGH (ref 11.5–15.5)
WBC: 7.2 10*3/uL (ref 4.0–10.5)
nRBC: 0 % (ref 0.0–0.2)

## 2020-12-15 LAB — BASIC METABOLIC PANEL
Anion gap: 6 (ref 5–15)
BUN: 17 mg/dL (ref 6–20)
CO2: 22 mmol/L (ref 22–32)
Calcium: 8.9 mg/dL (ref 8.9–10.3)
Chloride: 106 mmol/L (ref 98–111)
Creatinine, Ser: 0.71 mg/dL (ref 0.44–1.00)
GFR, Estimated: >60 mL/min (ref >60)
Glucose, Bld: 101 mg/dL — ABNORMAL HIGH (ref 70–99)
Potassium: 4.2 mmol/L (ref 3.5–5.1)
Sodium: 134 mmol/L — ABNORMAL LOW (ref 135–145)

## 2020-12-15 NOTE — [place] FL2 (Signed)
[place] MEDICAID FL2 LEVEL OF CARE SCREENING TOOL     IDENTIFICATION  Patient Name: [name] Birthdate: [birth year] Sex: female Admission Date (Current Location): 12/12/2020  [facility] and [place] Number:  Producer, television/film/video and Address:  [facility] [place], [address] [place], [place] [address]      Provider Number: [id number]  Attending Physician Name and Address:  Miguel Aschoff, MD  Relative Name and Phone Number:       Current Level of Care: Hospital Recommended Level of Care: Skilled Nursing Facility Prior Approval Number:    Date Approved/Denied:   PASRR Number: [id number]  Discharge Plan: SNF    Current Diagnoses: Patient Active Problem List   Diagnosis Date Noted   Decubitus ulcer 12/13/2020   Decubitus ulcer, unstageable with infection (HCC) 12/12/2020   UTI (urinary tract infection) 12/12/2020   AKI (acute kidney injury) (HCC) 11/12/2020   Sacral osteomyelitis (HCC) 01/28/2019   Stage 4 decubitus ulcer (HCC) 01/25/2019   Cellulitis 01/25/2019   Decubitus skin ulcer 01/25/2019   Anxiety 05/13/2013   Essential hypertension, benign 04/25/2013   Encounter for long-term (current) use of other medications 04/25/2013   Constipation 04/09/2013   Displaced bimalleolar fracture of left ankle 03/30/2013   Multiple sclerosis (HCC) 08/25/2012   Chest pain 08/25/2012   Panic attack 08/25/2012    Orientation RESPIRATION BLADDER Height & Weight     Self, Time, Situation, Place  Normal External catheter, Incontinent Weight: 160 lb 11.5 oz (72.9 kg) Height:  5\' 1"  (154.9 cm)  BEHAVIORAL SYMPTOMS/MOOD NEUROLOGICAL BOWEL NUTRITION STATUS      Continent Diet (please see discharge summary)  AMBULATORY STATUS COMMUNICATION OF NEEDS Skin    (bedbound) Verbally Other (Comment) (pressure injury,buttocks right, stage 4-full thickness tissue loss w/exposed nbone, tendon or muscle, wound/incision thigh left ,medial inner left thigh)                        Personal Care Assistance Level of Assistance  Bathing, Feeding, Dressing Bathing Assistance: Maximum assistance Feeding assistance: Independent Dressing Assistance: Maximum assistance     Functional Limitations Info  Sight, Hearing, Speech Sight Info: Impaired (wears glasses) Hearing Info: Adequate Speech Info: Adequate    SPECIAL CARE FACTORS FREQUENCY                       Contractures Contractures Info: Not present    Additional Factors Info  Code Status, Allergies Code Status Info: FULL Allergies Info: Tizanidine, Silver,Tomato           Current Medications (12/15/2020):  This is the current hospital active medication list Current Facility-Administered Medications  Medication Dose Route Frequency Provider Last Rate Last Admin   acetaminophen (TYLENOL) tablet 650 mg  650 mg Oral Q6H 02/14/2021, MD   650 mg at 12/15/20 1442   amoxicillin-clavulanate (AUGMENTIN) 400-57 MG/5ML suspension 800 mg  800 mg Oral Q12H 02/14/21, MD   800 mg at 12/15/20 0901   baclofen (LIORESAL) tablet 20 mg  20 mg Oral TID 02/14/21, MD   20 mg at 12/15/20 0901   doxycycline (VIBRA-TABS) tablet 100 mg  100 mg Oral Q12H 02/14/21, MD   100 mg at 12/15/20 0901   gabapentin (NEURONTIN) capsule 300 mg  300 mg Oral BID 02/14/21, MD   300 mg at 12/15/20 0900   And   gabapentin (NEURONTIN) capsule 600 mg  600 mg  Oral QHS Verdene Lennert, MD   600 mg at 12/14/20 2210   heparin injection 5,000 Units  5,000 Units Subcutaneous Q8H Verdene Lennert, MD   5,000 Units at 12/15/20 1436   HYDROmorphone (DILAUDID) injection 0.5 mg  0.5 mg Intravenous Q4H PRN Verdene Lennert, MD   0.5 mg at 12/15/20 0900   ondansetron (ZOFRAN) tablet 4 mg  4 mg Oral Q8H PRN Princess Bruins, DO   4 mg at 12/14/20 1506   oxyCODONE (Oxy IR/ROXICODONE) immediate release tablet 5-10 mg  5-10 mg Oral Q6H PRN Verdene Lennert, MD   10 mg at 12/15/20 1436   polyethylene glycol  (MIRALAX / GLYCOLAX) packet 17 g  17 g Oral Daily Princess Bruins, DO   17 g at 12/15/20 1439   sodium hypochlorite (DAKIN'S 1/4 STRENGTH) topical solution   Topical BID Miguel Aschoff, MD   Given at 12/15/20 0901   Zinc Oxide (TRIPLE PASTE) 12.8 % ointment   Topical BID Miguel Aschoff, MD   Given at 12/15/20 0901     Discharge Medications: Please see discharge summary for a list of discharge medications.  Relevant Imaging Results:  Relevant Lab Results:   Additional Information SSN [id number] patient reports covid vaccine x3  Eduard Roux, LCSW

## 2020-12-15 NOTE — Progress Notes (Signed)
HD 3 SUBJECTIVE:  Patient Summary: [name] is a 50 y.o. female, with pertinent PMHx of multiple sclerosis for the past 16 years, complicated by severe debility, bedbound since 2015, and hypertension presents to [facility] for worsening right gluteal ulcer and admitted for right ischial decubitus ulcer and osteomyelitis of the right ischial tuberosity.  Overnight Events: No acute events overnight.  Interim History: [name] who was seen and evaluated at the bedside this morning. States that her pain is well controlled, except during dressing change. States that she has not had a bowel movement yet, but is amenable to bowel regimen.  Patient is amenable to SNF, however requests that she is not sent to [facility].  Otherwise she has no other concerns.  OBJECTIVE:  Vital Signs: Vitals:   12/14/20 1937 12/14/20 2318 12/15/20 0629 12/15/20 0759  BP: 126/66 128/63 122/69 133/64  Pulse: 92 83 86 87  Resp: 18 18 18 19   Temp: 98.2 F (36.8 C) 98 F (36.7 C) 98.4 F (36.9 C) 98.5 F (36.9 C)  TempSrc: Oral Oral Oral Oral  SpO2: 98% 99% 100% 100%  Weight:      Height:       Supplemental O2:  Room Air SpO2: 100 %  Filed Weights   12/12/20 1400 12/12/20 1431 12/14/20 0009  Weight: 81.8 kg 86.2 kg 72.9 kg    Intake/Output Summary (Last 24 hours) at 12/15/2020 1507 Last data filed at 12/15/2020 0557 Gross per 24 hour  Intake 480 ml  Output 2150 ml  Net -1670 ml   Net IO Since Admission: -1,040.19 mL [12/15/20 1507]  Physical Exam: Physical Exam Vitals and nursing note reviewed.  HENT:     Head: Normocephalic and atraumatic.  Eyes:     Extraocular Movements: Extraocular movements intact.  Cardiovascular:     Rate and Rhythm: Normal rate and regular rhythm.  Pulmonary:     Effort: Pulmonary effort is normal.     Breath sounds: Normal breath sounds.  Abdominal:     Palpations: Abdomen is soft.     Tenderness: There is no abdominal tenderness.  Musculoskeletal:      Right lower leg: No edema.     Left lower leg: No edema.  Skin:    General: Skin is warm and dry.  Neurological:     General: No focal deficit present.     Mental Status: She is alert and oriented to person, place, and time. Mental status is at baseline.    Patient Lines/Drains/Airways Status     Active Line/Drains/Airways     Name Placement date Placement time Site Days   Peripheral IV 12/12/20 20 G Anterior;Proximal;Right Forearm 12/12/20  1343  Forearm  1   Incision 03/30/13 Leg Left 03/30/13  1332  -- 2815   Wound / Incision (Open or Dehisced) 01/26/19 Other (Comment) Buttocks Left Stg 4 01/26/19  0600  Buttocks  687   Wound / Incision (Open or Dehisced) 01/26/19 Buttocks Right Skin tear/stg 2 01/26/19  0600  Buttocks  687   Wound / Incision (Open or Dehisced) 01/26/19 Buttocks Right skin tear/ Stg 2 01/26/19  0600  Buttocks  687             ASSESSMENT/PLAN:  Assessment: Principal Problem:   Decubitus ulcer, unstageable with infection (HCC) Active Problems:   UTI (urinary tract infection)   Decubitus ulcer  Plan: #Acute osteomyelitis 2/2 right ischial pressure ulcer  2/2 to being chronically bed-bound due to her MS.  Right ischial tuberosity osteomyelitis as evident on MRI, no abscess noted.  Per ortho, patient was not a candidate for debridement or a flap candidate. Wound was packed with gauze soaked in Dankin's solution. Pending wound culture and blood culture for sensitivity. On 10/6, no growth in 3 days on blood culture. She is afebrile and has no leukocytosis.  Per ID, transitioning patient to PO Augmentin and doxycycline both q12h for 2wks. - Follow-up blood and wound culture - Trend CBC - Continue Augmentin 800 mg q12h for 2wks and doxycycline 100 mg q12h for 2wks (end 10/20) - Dilaudid 0.5 mg q4h PRN for severe pain - Wound care consulted, appreciate recs - Infectious Disease consulted, appreciate recs - Ortho consulted, appreciate recs  #Symptomatic  UTI Urine culture is significant for Citrobacter Freundii and pseudomonas. Sensitivity resulted, however patient stated that her symptoms have resolved after starting abx, per above. Will hold off on adding another antibiotic and will obtain repeat urine culture. - Follow-up repeat urine culture  #Multiple sclerosis - Resumed home baclofen and gabapentin  Best Practice: Diet: Regular diet IVF: Fluids: none VTE: heparin injection 5,000 Units Start: 12/12/20 1646 Code: Full AB: Augmentin 800 mg every 12 hours for 28 doses and doxycycline 100 mg every 12 hours for 28 doses. (End 10/20)  DISPO: Anticipated discharge to SNF pending  clinical improvement .  Signature: Princess Bruins, DO Psychiatry Resident, PGY-1 [name] Teaching Service Pager: [phone]  3:07 PM, 12/15/2020   Please contact the on call pager after 5 pm and on weekends at [phone].

## 2020-12-15 NOTE — Progress Notes (Signed)
  Progress Note   Date: 12/15/2020  Patient Name: [name]        MRN#: [id number]  Review the patient's clinical findings supports the diagnosis of:   Mild AKI due in part to chronic NSAID use, resolved when NSAID stopped.

## 2020-12-15 NOTE — TOC Initial Note (Signed)
Transition of Care [facility]) - Initial/Assessment Note    Patient Details  Name: [name] MRN: [id number] Date of Birth: February 12, 1971  Transition of Care [facility]) CM/SW Contact:    Vinie Sill, LCSW Phone Number: 12/15/2020, 3:50 PM  Clinical Narrative:                  CSW met with patient at bedside. CSW introduced self and explained role. CSW discuss with patient therapy recommendation for short term rehab at [facility]. Patient states she lives in the home with her parents. She states she belives it is best for her to go to short term rehab so that her wound con improve. She states wants it to improve so that her  home wound care nurse can manage it " it too deep right now". Patient states she is agreeable to SNF. Patient reports she has been to SNF before but prefers not to return to her last SNF placement.  CSW was given permission to send referrals. Patient she has received covid vaccine x3- she is not sure if she wants the 4th vaccine shot.   Patient expressed she wants pressure wound mattress- she states she is uncomfortable- She reports she has informed the Attending MD- CSW advised to also follow up with her nurse. No other concerns or questions noted at this time.  TOC will provide bed offers once available TOC will continue to follow and assist with discharge planning.  Thurmond Butts, MSW, LCSW Clinical Social Worker    Expected Discharge Plan: Skilled Nursing Facility Barriers to Discharge: Ship broker, Continued Medical Work up, SNF Pending bed offer   Patient Goals and CMS Choice        Expected Discharge Plan and Services Expected Discharge Plan: [place] In-house Referral: Clinical Social Work                                            Prior Living Arrangements/Services   Lives with:: Self, Parents Patient language and need for interpreter reviewed:: No        Need for Family Participation in Patient Care: Yes  (Comment) Care giver support system in place?: Yes (comment)   Criminal Activity/Legal Involvement Pertinent to Current Situation/Hospitalization: No - Comment as needed  Activities of Daily Living Home Assistive Devices/Equipment: [facility] bed ADL Screening (condition at time of admission) Patient's cognitive ability adequate to safely complete daily activities?: Yes Is the patient deaf or have difficulty hearing?: No Does the patient have difficulty seeing, even when wearing glasses/contacts?: No Does the patient have difficulty concentrating, remembering, or making decisions?: No Patient able to express need for assistance with ADLs?: Yes Does the patient have difficulty dressing or bathing?: Yes Independently performs ADLs?: No Communication: Independent Dressing (OT): Needs assistance Is this a change from baseline?: Pre-admission baseline Grooming: Needs assistance Is this a change from baseline?: Pre-admission baseline Feeding: Independent Bathing: Needs assistance Is this a change from baseline?: Pre-admission baseline Toileting: Dependent Is this a change from baseline?: Pre-admission baseline In/Out Bed: Dependent Is this a change from baseline?: Pre-admission baseline Walks in Home: Dependent Is this a change from baseline?: Pre-admission baseline Does the patient have difficulty walking or climbing stairs?: Yes Weakness of Legs: Both Weakness of Arms/Hands: Both  Permission Sought/Granted Permission sought to share information with : Family Supports Permission granted to share information with : Yes, Verbal  Permission Granted  Share Information with NAME: Corrinne Benegas  Permission granted to share info w AGENCY: SNFs  Permission granted to share info w Relationship: mother  Permission granted to share info w Contact Information: [phone]  Emotional Assessment Appearance:: Appears stated age Attitude/Demeanor/Rapport: Engaged Affect (typically  observed): Pleasant, Accepting, Appropriate Orientation: : Oriented to Self, Oriented to Place, Oriented to  Time, Oriented to Situation Alcohol / Substance Use: Not Applicable Psych Involvement: No (comment)  Admission diagnosis:  Decubitus ulcer [L89.90] Pressure injury of skin of sacral region, unspecified injury stage [L89.159] Patient Active Problem List   Diagnosis Date Noted   Decubitus ulcer 12/13/2020   Decubitus ulcer, unstageable with infection ([place]) 12/12/2020   UTI (urinary tract infection) 12/12/2020   AKI (acute kidney injury) ([place]) 11/12/2020   Sacral osteomyelitis ([place]) 01/28/2019   Stage 4 decubitus ulcer ([place]) 01/25/2019   Cellulitis 01/25/2019   Decubitus skin ulcer 01/25/2019   Anxiety 05/13/2013   Essential hypertension, benign 04/25/2013   Encounter for long-term (current) use of other medications 04/25/2013   Constipation 04/09/2013   Displaced bimalleolar fracture of left ankle 03/30/2013   Multiple sclerosis ([place]) 08/25/2012   Chest pain 08/25/2012   Panic attack 08/25/2012   PCP:  Alvester Chou, NP Pharmacy:   [id number] [place] [place] [address]Phone: [phone]:: [phone]    Social Determinants of Health (SDOH) Interventions    Readmission Risk Interventions No flowsheet data found.

## 2020-12-16 LAB — CBC WITH DIFFERENTIAL/PLATELET
Abs Immature Granulocytes: 0.06 10*3/uL (ref 0.00–0.07)
Basophils Absolute: 0 10*3/uL (ref 0.0–0.1)
Basophils Relative: 0 %
Eosinophils Absolute: 0.2 10*3/uL (ref 0.0–0.5)
Eosinophils Relative: 2 %
HCT: 40.5 % (ref 36.0–46.0)
Hemoglobin: 12.5 g/dL (ref 12.0–15.0)
Immature Granulocytes: 1 %
Lymphocytes Relative: 35 %
Lymphs Abs: 3.3 10*3/uL (ref 0.7–4.0)
MCH: 28.7 pg (ref 26.0–34.0)
MCHC: 30.9 g/dL (ref 30.0–36.0)
MCV: 92.9 fL (ref 80.0–100.0)
Monocytes Absolute: 0.8 10*3/uL (ref 0.1–1.0)
Monocytes Relative: 9 %
Neutro Abs: 5.1 10*3/uL (ref 1.7–7.7)
Neutrophils Relative %: 53 %
Platelets: 229 10*3/uL (ref 150–400)
RBC: 4.36 MIL/uL (ref 3.87–5.11)
RDW: 15.9 % — ABNORMAL HIGH (ref 11.5–15.5)
WBC: 9.6 10*3/uL (ref 4.0–10.5)
nRBC: 0 % (ref 0.0–0.2)

## 2020-12-16 LAB — URINE CULTURE: Culture: 80000 — AB

## 2020-12-16 LAB — BASIC METABOLIC PANEL
Anion gap: 8 (ref 5–15)
BUN: 16 mg/dL (ref 6–20)
CO2: 22 mmol/L (ref 22–32)
Calcium: 9 mg/dL (ref 8.9–10.3)
Chloride: 106 mmol/L (ref 98–111)
Creatinine, Ser: 0.77 mg/dL (ref 0.44–1.00)
GFR, Estimated: >60 mL/min (ref >60)
Glucose, Bld: 92 mg/dL (ref 70–99)
Potassium: 4.8 mmol/L (ref 3.5–5.1)
Sodium: 136 mmol/L (ref 135–145)

## 2020-12-16 MED ORDER — CALCIUM POLYCARBOPHIL 625 MG PO TABS
625.0000 mg | ORAL_TABLET | Freq: Every day | ORAL | Status: DC
  Administered 2020-12-16 – 2020-12-21 (×4): 625 mg via ORAL
  Filled 2020-12-16 (×6): qty 1

## 2020-12-16 NOTE — Progress Notes (Signed)
HD 3 SUBJECTIVE:  Patient Summary: [name] is a 50 y.o. female, with pertinent PMHx of multiple sclerosis for the past 16 years, complicated by severe debility, bedbound since 2015, and hypertension presents to [facility] for worsening right gluteal ulcer and admitted for right ischial decubitus ulcer and osteomyelitis of the right ischial tuberosity.  Overnight Events: No acute events overnight.  Interim History: Ms. CHOUA IKNER who was seen and evaluated at the bedside this morning.  Reports that she is feeling and slept well, and that her pain is well controlled.  States that she was able to have 2 bowel movements yesterday, that were loose.  For this reason, states that she would skip her schedule bowel regiment, to avoid the diarrhea.  Discussed with patient the importance of daily bowel regiment, and that we will work with her to optimize it.  Noticed redness on b/l UE.  Patient states that the redness is not new, that occurs when she feels too hot.  States that they do not itch or hurt.  She denies chest pain, shortness of breath, abdominal pain.  Otherwise patient had no other concerns.  OBJECTIVE:  Vital Signs: Vitals:   12/15/20 2008 12/15/20 2331 12/16/20 0410 12/16/20 0714  BP: (!) 108/52 132/62 122/65 110/63  Pulse: 93 93 69 77  Resp: 17 18  16   Temp: 98.5 F (36.9 C) 98 F (36.7 C) 98.2 F (36.8 C) 98.3 F (36.8 C)  TempSrc: Oral Oral Oral Oral  SpO2: 99% 97% 95% 96%  Weight:      Height:       Supplemental O2:  Room Air SpO2: 96 %  Filed Weights   12/12/20 1400 12/12/20 1431 12/14/20 0009  Weight: 81.8 kg 86.2 kg 72.9 kg    Intake/Output Summary (Last 24 hours) at 12/16/2020 1148 Last data filed at 12/16/2020 1057 Gross per 24 hour  Intake 1920 ml  Output 1400 ml  Net 520 ml   Net IO Since Admission: 199.81 mL [12/16/20 1148]  Physical Exam: Physical Exam Vitals and nursing note reviewed.  HENT:     Head: Normocephalic and atraumatic.   Eyes:     Extraocular Movements: Extraocular movements intact.  Cardiovascular:     Rate and Rhythm: Normal rate and regular rhythm.  Pulmonary:     Effort: Pulmonary effort is normal. No respiratory distress.     Breath sounds: Normal breath sounds. No wheezing or rales.  Abdominal:     Palpations: Abdomen is soft.     Tenderness: There is no abdominal tenderness.  Musculoskeletal:     Cervical back: Normal range of motion.     Right lower leg: Edema (+1) present.     Left lower leg: Edema (+1) present.  Skin:    General: Skin is warm and dry.     Findings: Erythema (On b/l UE) present.  Neurological:     General: No focal deficit present.     Mental Status: She is alert and oriented to person, place, and time. Mental status is at baseline.  Psychiatric:        Mood and Affect: Mood normal.        Behavior: Behavior normal.    Patient Lines/Drains/Airways Status     Active Line/Drains/Airways     Name Placement date Placement time Site Days   Peripheral IV 12/12/20 20 G Anterior;Proximal;Right Forearm 12/12/20  1343  Forearm  1   Incision 03/30/13 Leg Left 03/30/13  1332  -- 2815  Wound / Incision (Open or Dehisced) 01/26/19 Other (Comment) Buttocks Left Stg 4 01/26/19  0600  Buttocks  687   Wound / Incision (Open or Dehisced) 01/26/19 Buttocks Right Skin tear/stg 2 01/26/19  0600  Buttocks  687   Wound / Incision (Open or Dehisced) 01/26/19 Buttocks Right skin tear/ Stg 2 01/26/19  0600  Buttocks  687            ASSESSMENT/PLAN:  Assessment: Principal Problem:   Decubitus ulcer, unstageable with infection (HCC) Active Problems:   UTI (urinary tract infection)   Decubitus ulcer  Plan: #Acute osteomyelitis 2/2 right ischial pressure ulcer  2/2 to being chronically bed-bound due to her MS. Right ischial tuberosity osteomyelitis as evident on MRI, no abscess noted.  Per ortho, patient was not a wound debridement or flap candidate. Wound packed with gauze soaked in  Dankin's solution. Pending blood culture for sensitivity, which shows no growth in 3 days. She is afebrile and has no leukocytosis.  Per ID, transitioning patient to PO Augmentin and doxycycline both q12h for 2wks. Pending SNF placement for wound care. - Trend CBC - Continue Augmentin 800 mg q12h for 2wks and doxycycline 100 mg q12h for 2wks (end 10/20) - Dilaudid 0.5 mg q4h PRN for severe pain - Wound care consulted, appreciate recs - Infectious Disease consulted, appreciate recs - Ortho consulted, appreciate recs  #Possibly Symptomatic UTI Initial urine culture is significant for Citrobacter Freundii and pseudomonas. Sensitivity resulted, however patient stated that her symptoms have resolved after starting abx, per above. Will hold off on adding another antibiotic and will obtain repeat urine culture. - Follow-up repeat urine culture  #Multiple sclerosis - Resumed home baclofen and gabapentin  Best Practice: Diet: Regular diet IVF: Fluids: none VTE: heparin injection 5,000 Units Start: 12/12/20 1646 Code: Full AB: Augmentin 800 mg every 12 hours for 28 doses and doxycycline 100 mg every 12 hours for 28 doses. (End 10/20)  DISPO: Anticipated discharge to SNF pending  clinical improvement .  Signature: Princess Bruins, DO Psychiatry Resident, PGY-1 [name] Teaching Service Pager: [phone]  11:48 AM, 12/16/2020   Please contact the on call pager after 5 pm and on weekends at [phone].

## 2020-12-17 ENCOUNTER — Encounter (HOSPITAL_COMMUNITY): Payer: Self-pay | Admitting: Internal Medicine

## 2020-12-17 DIAGNOSIS — L089 Local infection of the skin and subcutaneous tissue, unspecified: Secondary | ICD-10-CM | POA: Diagnosis not present

## 2020-12-17 DIAGNOSIS — L8995 Pressure ulcer of unspecified site, unstageable: Secondary | ICD-10-CM | POA: Diagnosis not present

## 2020-12-17 LAB — CULTURE, BLOOD (ROUTINE X 2)
Culture: NO GROWTH
Culture: NO GROWTH
Special Requests: ADEQUATE

## 2020-12-17 LAB — URINE CULTURE: Culture: <10000 — AB

## 2020-12-17 MED ORDER — DICLOFENAC SODIUM 1 % EX GEL
2.0000 g | Freq: Four times a day (QID) | CUTANEOUS | Status: DC
  Administered 2020-12-17: 2 g via TOPICAL
  Filled 2020-12-17: qty 100

## 2020-12-17 MED ORDER — DICLOFENAC SODIUM 1 % EX GEL
4.0000 g | Freq: Four times a day (QID) | CUTANEOUS | Status: DC
  Administered 2020-12-17 – 2020-12-25 (×28): 4 g via TOPICAL
  Filled 2020-12-17 (×2): qty 100

## 2020-12-17 NOTE — Progress Notes (Signed)
HD 3 SUBJECTIVE:  Patient Summary: [name] is a 50 y.o. female, with pertinent PMHx of multiple sclerosis for the past 16 years, complicated by severe debility, bedbound since 2015, and hypertension presents to [facility] for worsening right gluteal ulcer and admitted for right ischial decubitus ulcer and osteomyelitis of the right ischial tuberosity.  Overnight Events: No acute events overnight.  Interim History: [name] who was seen and evaluated at the bedside this morning.  She endorses left knee and leg pain from leaning against the plastic bed. She has received Tylenol so far.   Endorses diarrhea due to MiraLAX, discussed with patient the concern for severe fecal impaction, that required hospitalization last time.   OBJECTIVE:  Vital Signs: Vitals:   12/16/20 1622 12/16/20 1927 12/17/20 0037 12/17/20 0408  BP: 132/76 126/66 (!) 119/59 122/72  Pulse: 98 96 98 88  Resp: 15 16 16 16   Temp: 97.8 F (36.6 C) 98.4 F (36.9 C) 98.4 F (36.9 C) 98.6 F (37 C)  TempSrc:  Oral Oral Oral  SpO2: 98% 100% 96% 98%  Weight:      Height:       Supplemental O2:  Room Air SpO2: 98 %  Filed Weights   12/12/20 1400 12/12/20 1431 12/14/20 0009  Weight: 81.8 kg 86.2 kg 72.9 kg    Intake/Output Summary (Last 24 hours) at 12/17/2020 0656 Last data filed at 12/17/2020 0600 Gross per 24 hour  Intake 1340 ml  Output 400 ml  Net 940 ml    Net IO Since Admission: 659.81 mL [12/17/20 0656]  Physical Exam: Physical Exam Vitals and nursing note reviewed.  HENT:     Head: Normocephalic and atraumatic.  Eyes:     Extraocular Movements: Extraocular movements intact.  Cardiovascular:     Rate and Rhythm: Normal rate and regular rhythm.  Pulmonary:     Effort: Pulmonary effort is normal. No respiratory distress.     Breath sounds: Normal breath sounds. No wheezing or rales.  Abdominal:     Palpations: Abdomen is soft.     Tenderness: There is no abdominal  tenderness.  Musculoskeletal:     Cervical back: Normal range of motion.     Right lower leg: Edema (+1) present.     Left lower leg: Edema (+1) present.  Skin:    General: Skin is warm and dry.     Findings: Erythema (On b/l UE) present.  Neurological:     General: No focal deficit present.     Mental Status: She is alert and oriented to person, place, and time. Mental status is at baseline.  Psychiatric:        Mood and Affect: Mood normal.        Behavior: Behavior normal.    Patient Lines/Drains/Airways Status     Active Line/Drains/Airways     Name Placement date Placement time Site Days   Peripheral IV 12/12/20 20 G Anterior;Proximal;Right Forearm 12/12/20  1343  Forearm  1   Incision 03/30/13 Leg Left 03/30/13  1332  -- 2815   Wound / Incision (Open or Dehisced) 01/26/19 Other (Comment) Buttocks Left Stg 4 01/26/19  0600  Buttocks  687   Wound / Incision (Open or Dehisced) 01/26/19 Buttocks Right Skin tear/stg 2 01/26/19  0600  Buttocks  687   Wound / Incision (Open or Dehisced) 01/26/19 Buttocks Right skin tear/ Stg 2 01/26/19  0600  Buttocks  687  ASSESSMENT/PLAN:  Assessment: Principal Problem:   Decubitus ulcer, unstageable with infection (HCC) Active Problems:   UTI (urinary tract infection)   Decubitus ulcer  Plan: #Acute osteomyelitis 2/2 right ischial pressure ulcer  2/2 to being chronically bed-bound due to her MS. Right ischial tuberosity osteomyelitis as evident on MRI, no abscess noted.  Per ortho, patient was not a wound debridement or flap candidate. Wound packed with gauze soaked in Dankin's solution. Pending blood culture for sensitivity, which shows no growth in 3 days. She is afebrile and has no leukocytosis.  Per ID, transitioning patient to PO Augmentin and doxycycline both q12h for 2wks. Pending SNF placement for wound care. - Wound care consulted, appreciate recs - Infectious Disease consulted, appreciate recs - Ortho consulted,  appreciate recs - Continue Augmentin 800 mg q12h for 2wks and doxycycline 100 mg q12h for 2wks (end 10/20) - Dilaudid 0.5 mg q4h PRN for severe pain  #Multiple sclerosis - Resumed home baclofen and gabapentin  #Left knee pain Was taking meloxicam for knee pain, however this was held on admission due to AKA -Voltaren gel 4g QID  Best Practice: Diet: Regular diet IVF: Fluids: none VTE: heparin injection 5,000 Units Start: 12/12/20 1646 Code: Full AB: Augmentin 800 mg every 12 hours for 28 doses and doxycycline 100 mg every 12 hours for 28 doses. (End 10/20)  DISPO: Anticipated discharge to SNF pending  clinical improvement .  Signature: Princess Bruins, DO Psychiatry Resident, PGY-1 [name] Teaching Service Pager: [phone]  6:56 AM, 12/17/2020   Please contact the on call pager after 5 pm and on weekends at [phone].

## 2020-12-18 MED ORDER — LOPERAMIDE HCL 2 MG PO CAPS
4.0000 mg | ORAL_CAPSULE | Freq: Once | ORAL | Status: AC
  Administered 2020-12-18: 4 mg via ORAL
  Filled 2020-12-18: qty 2

## 2020-12-18 NOTE — Progress Notes (Signed)
HD 3 SUBJECTIVE:  Patient Summary: [name] is a 50 y.o. female, with pertinent PMHx of multiple sclerosis for the past 16 years, complicated by severe debility, bedbound since 2015, and hypertension presents to [facility] for worsening right gluteal ulcer and admitted for right ischial decubitus ulcer and osteomyelitis of the right ischial tuberosity.  Overnight Events: No acute events overnight.  Interim History: [name] who was seen and evaluated at the bedside this morning.  States that she slept well, appetite is good.  Still has ongoing loose stools, about 3 a day.  Discussed with patient that this is likely from her antibiotics, as they are noted to cause diarrhea.  Otherwise patient had no other concerns.  She denies abdominal pain, cramping, tenderness, nausea or vomiting chest pain, shortness of breath.  OBJECTIVE:  Vital Signs: Vitals:   12/17/20 2311 12/18/20 0412 12/18/20 0829 12/18/20 1101  BP: 131/70 (!) 94/53 139/67 (!) 114/58  Pulse: 84 72 81 99  Resp: 16 16 18 18   Temp: 98.3 F (36.8 C) 98 F (36.7 C) 98.1 F (36.7 C) 99.1 F (37.3 C)  TempSrc: Oral Oral Oral Oral  SpO2: 98% 96% 99% 98%  Weight:      Height:       Supplemental O2:  Room Air SpO2: 98 %  Filed Weights   12/12/20 1400 12/12/20 1431 12/14/20 0009  Weight: 81.8 kg 86.2 kg 72.9 kg    Intake/Output Summary (Last 24 hours) at 12/18/2020 1450 Last data filed at 12/18/2020 1100 Gross per 24 hour  Intake 480 ml  Output 825 ml  Net -345 ml   Net IO Since Admission: 314.81 mL [12/18/20 1450]  Physical Exam: Physical Exam Vitals and nursing note reviewed.  HENT:     Head: Normocephalic and atraumatic.  Eyes:     Extraocular Movements: Extraocular movements intact.  Cardiovascular:     Rate and Rhythm: Normal rate and regular rhythm.  Pulmonary:     Effort: Pulmonary effort is normal. No respiratory distress.     Breath sounds: Normal breath sounds. No wheezing or  rales.  Abdominal:     Palpations: Abdomen is soft.     Tenderness: There is no abdominal tenderness.  Musculoskeletal:     Cervical back: Normal range of motion.     Right lower leg: No edema.     Left lower leg: No edema.  Skin:    General: Skin is warm and dry.     Findings: Erythema: On b/l UE.  Neurological:     General: No focal deficit present.     Mental Status: She is alert and oriented to person, place, and time. Mental status is at baseline.  Psychiatric:        Mood and Affect: Mood normal.        Behavior: Behavior normal.    Patient Lines/Drains/Airways Status     Active Line/Drains/Airways     Name Placement date Placement time Site Days   Peripheral IV 12/12/20 20 G Anterior;Proximal;Right Forearm 12/12/20  1343  Forearm  1   Incision 03/30/13 Leg Left 03/30/13  1332  -- 2815   Wound / Incision (Open or Dehisced) 01/26/19 Other (Comment) Buttocks Left Stg 4 01/26/19  0600  Buttocks  687   Wound / Incision (Open or Dehisced) 01/26/19 Buttocks Right Skin tear/stg 2 01/26/19  0600  Buttocks  687   Wound / Incision (Open or Dehisced) 01/26/19 Buttocks Right skin tear/ Stg 2 01/26/19  0600  Buttocks  687            ASSESSMENT/PLAN:  Assessment: Principal Problem:   Decubitus ulcer, unstageable with infection (HCC) Active Problems:   UTI (urinary tract infection)   Decubitus ulcer  Plan: #Acute osteomyelitis 2/2 right ischial pressure ulcer  2/2 to being chronically bed-bound due to her MS. Right ischial tuberosity osteomyelitis as evident on MRI, no abscess noted.  Per ortho, patient was not a wound debridement or flap candidate. Wound packed with gauze soaked in Dankin's solution. Per ID, PO Augmentin and doxycycline both q12h for 2wks. Pending SNF placement for wound care.  - Wound care consulted, appreciate recs - Infectious Disease consulted, appreciate recs - Ortho consulted, appreciate recs - Follow-up PT/OT recs - Continue Augmentin 800 mg q12h for  2wks and doxycycline 100 mg q12h for 2wks (end 10/20) - Dilaudid 0.5 mg q4h PRN for severe pain  #Diarrhea 2/2 current antibiotics that has been ongoing since 2-3 days.  We will provide Imodium for symptom relief.  -Imodium 4 mg -BMP  #Multiple sclerosis - Resumed home baclofen and gabapentin  #Left knee pain Was taking meloxicam for knee pain, however this was held on admission due to AKI -Voltaren gel 4g QID  Best Practice: Diet: Regular diet IVF: Fluids: none VTE: heparin injection 5,000 Units Start: 12/12/20 1646 Code: Full AB: Augmentin 800 mg every 12 hours for 28 doses and doxycycline 100 mg every 12 hours for 28 doses. (End 10/20)  DISPO: Anticipated discharge to SNF pending placement  Signature: Princess Bruins, DO Psychiatry Resident, PGY-1 [name] Teaching Service Pager: [phone]  2:50 PM, 12/18/2020   Please contact the on call pager after 5 pm and on weekends at [phone].

## 2020-12-18 NOTE — Care Management Important Message (Signed)
Important Message  Patient Details  Name: [name] MRN: [id number] Date of Birth: Jun 16, 1970   Medicare Important Message Given:  Yes     Renie Ora 12/18/2020, 10:20 AM

## 2020-12-18 NOTE — Evaluation (Signed)
Physical Therapy Evaluation and Discharge Patient Details Name: [name] MRN: [id number] DOB: [birth year] Today's Date: 12/18/2020  History of Present Illness  Pt is a 50 y/o female admitted secondary to worsening R ischial and sacral pressure ulcer and UTI. Found to have osteomyelitis of sacrum as well. PMH includes MS and HTN.  Clinical Impression  Pt admitted secondary to problem above with deficits below. Pt reporting increased pain in LLE which limited some mobility. Was able to roll from side to side with mod A. Pt is bed bound at baseline. Per notes, plan is to go to SNF for wound care. Defer all further needs to SNF as pt is close to baseline. Will sign off. If needs change, please re-consult.        Recommendations for follow up therapy are one component of a multi-disciplinary discharge planning process, led by the attending physician.  Recommendations may be updated based on patient status, additional functional criteria and insurance authorization.  Follow Up Recommendations Other (comment) (SNF for wound care)    Equipment Recommendations  None recommended by PT    Recommendations for Other Services       Precautions / Restrictions Precautions Precautions: Fall Restrictions Weight Bearing Restrictions: No      Mobility  Bed Mobility Overal bed mobility: Needs Assistance Bed Mobility: Rolling Rolling: Mod assist         General bed mobility comments: Required mod A for rolling from side to side. Pt able to assist some with use of UEs. Pt reports this is close to baseline.    Transfers                    Ambulation/Gait                Stairs            Wheelchair Mobility    Modified Rankin (Stroke Patients Only)       Balance                                             Pertinent Vitals/Pain Pain Assessment: Faces Faces Pain Scale: Hurts little more Pain Location: LLE with movement Pain Descriptors /  Indicators: Grimacing;Guarding Pain Intervention(s): Limited activity within patient's tolerance;Monitored during session;Repositioned    Home Living Family/patient expects to be discharged to:: Private residence Living Arrangements: Parent Available Help at Discharge: Family;Available 24 hours/day;Personal care attendant Type of Home: House Home Access: Ramped entrance     Home Layout: One level Home Equipment: Bedside commode;Shower seat;Hospital bed;Wheelchair - Education administrator (comment) (manual hoyer lift)      Prior Function Level of Independence: Needs assistance   Gait / Transfers Assistance Needed: Pt reports being bedbound and only using hoyer lift intermittently to get OOB to w/c. Reports she does not sit up EOB.  ADL's / Homemaking Assistance Needed: Uses bathroom in briefs and family cleans up. Takes sponge baths in the bed.        Hand Dominance        Extremity/Trunk Assessment   Upper Extremity Assessment Upper Extremity Assessment: Defer to OT evaluation    Lower Extremity Assessment Lower Extremity Assessment: RLE deficits/detail;LLE deficits/detail RLE Deficits / Details: Pt with PF contractures. No AROM in RLE. Limited ROM at knee. LLE Deficits / Details: Contracture in hip ER and knee flexion. Also with PF contracture.  Cervical / Trunk Assessment Cervical / Trunk Assessment: Normal  Communication   Communication: No difficulties  Cognition Arousal/Alertness: Awake/alert Behavior During Therapy: WFL for tasks assessed/performed Overall Cognitive Status: Within Functional Limits for tasks assessed                                        General Comments      Exercises     Assessment/Plan    PT Assessment All further PT needs can be met in the next venue of care  PT Problem List Decreased strength;Decreased activity tolerance;Decreased balance;Decreased mobility;Decreased knowledge of use of DME;Decreased safety  awareness;Decreased knowledge of precautions;Pain       PT Treatment Interventions      PT Goals (Current goals can be found in the Care Plan section)  Acute Rehab PT Goals Patient Stated Goal: for wound to heal PT Goal Formulation: With patient Time For Goal Achievement: 12/18/20 Potential to Achieve Goals: Fair    Frequency     Barriers to discharge        Co-evaluation               AM-PAC PT "6 Clicks" Mobility  Outcome Measure Help needed turning from your back to your side while in a flat bed without using bedrails?: A Lot Help needed moving from lying on your back to sitting on the side of a flat bed without using bedrails?: Total Help needed moving to and from a bed to a chair (including a wheelchair)?: Total Help needed standing up from a chair using your arms (e.g., wheelchair or bedside chair)?: Total Help needed to walk in hospital room?: Total Help needed climbing 3-5 steps with a railing? : Total 6 Click Score: 7    End of Session   Activity Tolerance: Patient tolerated treatment well Patient left: in bed;with call bell/phone within reach Nurse Communication: Mobility status PT Visit Diagnosis: Other abnormalities of gait and mobility (R26.89);Difficulty in walking, not elsewhere classified (R26.2)    Time: 8299-3716 PT Time Calculation (min) (ACUTE ONLY): 13 min   Charges:   PT Evaluation $PT Eval Moderate Complexity: 1 Mod          Reuel Derby, PT, DPT  Acute Rehabilitation Services  Pager: [phone] Office: [phone]   Rudean Hitt 12/18/2020, 1:15 PM

## 2020-12-18 NOTE — Progress Notes (Signed)
OT Cancellation Note  Patient Details Name: [name] MRN: [id number] DOB: 1970/10/13   Cancelled Treatment:    Reason Eval/Treat Not Completed: OT screened, no needs identified, will sign off Pt has been bed bound since 2015 and requires assistance for ADL tasks. Plan is for SNF for wound care. No acute OT needs. Will defer any OT needs to SNF.   Shametra Cumberland,HILLARY 12/18/2020, 2:18 PM Luisa Dago, OT/L   Acute OT Clinical Specialist Acute Rehabilitation Services Pager [phone] Office [phone]

## 2020-12-19 LAB — BASIC METABOLIC PANEL
Anion gap: 9 (ref 5–15)
BUN: 14 mg/dL (ref 6–20)
CO2: 28 mmol/L (ref 22–32)
Calcium: 9.4 mg/dL (ref 8.9–10.3)
Chloride: 98 mmol/L (ref 98–111)
Creatinine, Ser: 0.66 mg/dL (ref 0.44–1.00)
GFR, Estimated: >60 mL/min (ref >60)
Glucose, Bld: 100 mg/dL — ABNORMAL HIGH (ref 70–99)
Potassium: 4.3 mmol/L (ref 3.5–5.1)
Sodium: 135 mmol/L (ref 135–145)

## 2020-12-19 MED ORDER — LOPERAMIDE HCL 2 MG PO CAPS
4.0000 mg | ORAL_CAPSULE | ORAL | Status: DC | PRN
  Administered 2020-12-19: 4 mg via ORAL
  Filled 2020-12-19: qty 2

## 2020-12-19 NOTE — TOC Progression Note (Signed)
Transition of Care [facility]) - Progression Note    Patient Details  Name: [name] MRN: [id number] Date of Birth: May 13, 1970  Transition of Care [facility]) CM/SW Contact  Eduard Roux, [place] Phone Number: 12/19/2020, 2:16 PM  Clinical Narrative:     Patient accepted bed offer with [facility]. CSW sent message to [facility] to confirm availability. CSW informed patient next step is submit clinicals to insurance, if [facility] has bed  to see if she will get SNF approval. Patient states she understands.  Antony Blackbird, MSW, LCSW Clinical Social Worker    Expected Discharge Plan: Skilled Nursing Facility Barriers to Discharge: English as a second language teacher, Continued Medical Work up, SNF Pending bed offer  Expected Discharge Plan and Services Expected Discharge Plan: Skilled Nursing Facility In-house Referral: Clinical Social Work                                             Social Determinants of Health (SDOH) Interventions    Readmission Risk Interventions No flowsheet data found.

## 2020-12-19 NOTE — Progress Notes (Addendum)
HD 3 SUBJECTIVE:  Patient Summary: [name] is a 50 y.o. female, with pertinent PMHx of multiple sclerosis for the past 16 years, complicated by severe debility, bedbound since 2015, and hypertension presents to [facility] for worsening right gluteal ulcer and admitted for stage 4 ischial pressure ulcer and osteomyelitis of the right ischial tuberosity.  Overnight Events: No acute events overnight.  Interim History: [name] who was seen and evaluated at the bedside. She endorses continued diarrhea but no abdominal pain. She is still having some pain in her gluteal region where the wound is present, but this is slowly improving.   OBJECTIVE:  Vital Signs: Vitals:   12/18/20 1506 12/18/20 1939 12/18/20 2341 12/19/20 0329  BP: 134/64 109/66 117/62 133/75  Pulse: (!) 103 94 93 89  Resp: 19 18 18 17   Temp: 98.5 F (36.9 C) 97.9 F (36.6 C) 98.5 F (36.9 C) 98.3 F (36.8 C)  TempSrc: Oral Oral Oral Oral  SpO2: 97% 97% 97% 98%  Weight:      Height:       Supplemental O2:  Room Air SpO2: 98 %  Filed Weights   12/12/20 1400 12/12/20 1431 12/14/20 0009  Weight: 81.8 kg 86.2 kg 72.9 kg    Intake/Output Summary (Last 24 hours) at 12/19/2020 0644 Last data filed at 12/19/2020 0600 Gross per 24 hour  Intake 1270 ml  Output 1825 ml  Net -555 ml    Net IO Since Admission: -15.19 mL [12/19/20 0644]  Physical Exam: Physical Exam Vitals and nursing note reviewed.  HENT:     Head: Normocephalic and atraumatic.  Pulmonary:     Effort: Pulmonary effort is normal. No respiratory distress.  Skin:    General: Skin is warm and dry.  Neurological:     General: No focal deficit present.     Mental Status: She is alert and oriented to person, place, and time. Mental status is at baseline.  Psychiatric:        Mood and Affect: Mood normal.        Behavior: Behavior normal.    ASSESSMENT/PLAN:  Assessment: Primary problem:  Infected stage 4 pressure ulcer of R  ischium associated with osteomyelitis.  Plan: #Acute osteomyelitis 2/2 right ischial pressure ulcer  2/2 to being chronically bed-bound due to her MS. Right ischial tuberosity osteomyelitis as evident on MRI, no abscess noted.  Not a surgical candidate. Pending SNF placement for wound care. Medically stable.   - Continue Augmentin 800 mg q12h for 2wks and doxycycline 100 mg q12h for 2wks (end 10/20) - Outpatient follow up with Ortho and ID - Dilaudid 0.5 mg q4h PRN for severe pain  #Diarrhea Secondary to medication adverse reaction (primarily Augmentin side effect).  -Imodium 4 mg PRN -Goal is to maintain soft but formed stools, to avoid contamination of wound, but also to avoid severe constipation which she recently experienced.  #Multiple sclerosis - Continue home baclofen and gabapentin  #Left knee pain Was taking meloxicam for knee pain, however this was held on admission due to AKI -Voltaren gel 4g QID  Best Practice: Diet: Regular diet IVF: Fluids: none VTE: heparin injection 5,000 Units Start: 12/12/20 1646 Code: Full AB: Augmentin 800 mg every 12 hours for 28 doses and doxycycline 100 mg every 12 hours for 28 doses. (End 10/20)  DISPO: Anticipated discharge to SNF pending placement  Signature: Dr. 11/20 Internal Medicine PGY-3  Pager: [phone] After 5pm on weekdays and 1pm on weekends:  On Call pager [phone]  12/19/2020, 12:38 PM

## 2020-12-19 NOTE — TOC Progression Note (Signed)
Transition of Care [facility]) - Progression Note    Patient Details  Name: KHAMANI DANIELY MRN: [id number] Date of Birth: September 26, 1970  Transition of Care [facility]) CM/SW Contact  Eduard Roux, [place] Phone Number: 12/19/2020, 3:28 PM  Clinical Narrative:     If insurance approval, [facility] ( patient only bed offer) will not have availability until Friday.   CSW will continue to follow and assist with discharge planning and try to seek other SNF options.  Antony Blackbird, MSW, LCSW Clinical Social Worker    Expected Discharge Plan: Skilled Nursing Facility Barriers to Discharge: English as a second language teacher, Continued Medical Work up, SNF Pending bed offer  Expected Discharge Plan and Services Expected Discharge Plan: Skilled Nursing Facility In-house Referral: Clinical Social Work                                             Social Determinants of Health (SDOH) Interventions    Readmission Risk Interventions No flowsheet data found.

## 2020-12-19 NOTE — Consult Note (Signed)
WOC Nurse wound follow up Received [place] from bedside RN stating [facility] order had expired. We will change to moistened saline gauze, covered with dry gauze and ABD pad. Change twice daily. Order placed in the chart. WOC will not follow at this time but are available to this patient and medical team. Please re-consult if needed.  Renaldo Reel Katrinka Blazing, MSN, RN, CMSRN, Angus Seller, [facility] Wound Treatment Associate Pager [phone]

## 2020-12-20 ENCOUNTER — Inpatient Hospital Stay (HOSPITAL_COMMUNITY): Payer: Medicare PPO

## 2020-12-20 MED ORDER — ACETAMINOPHEN 500 MG PO TABS
1000.0000 mg | ORAL_TABLET | Freq: Four times a day (QID) | ORAL | Status: DC
  Administered 2020-12-21 – 2020-12-25 (×15): 1000 mg via ORAL
  Filled 2020-12-20 (×16): qty 2

## 2020-12-20 MED ORDER — SODIUM CHLORIDE 0.9 % IV SOLN
100.0000 mg | Freq: Two times a day (BID) | INTRAVENOUS | Status: DC
  Administered 2020-12-20 – 2020-12-23 (×6): 100 mg via INTRAVENOUS
  Filled 2020-12-20 (×7): qty 100

## 2020-12-20 MED ORDER — MELOXICAM 7.5 MG PO TABS
7.5000 mg | ORAL_TABLET | Freq: Every day | ORAL | Status: DC
  Administered 2020-12-21 – 2020-12-25 (×5): 7.5 mg via ORAL
  Filled 2020-12-20 (×6): qty 1

## 2020-12-20 NOTE — Progress Notes (Signed)
Radiology called with abd portable results. DO paged and messaged. Waiting on return call.  Kenard Gower, RN

## 2020-12-20 NOTE — Progress Notes (Addendum)
HD 7  SUBJECTIVE:  Patient Summary: [name] is a 50 y.o. female, with pertinent PMHx of multiple sclerosis for the past 16 years, complicated by severe debility, bedbound since 2015, and hypertension presents to [facility] for worsening right gluteal ulcer and admitted for stage 4 ischial pressure ulcer and osteomyelitis of the right ischial tuberosity.  Overnight Events: No acute events overnight.  Interim History: [name] who was seen and evaluated at the bedside this AM. She reports feeling nauseated and vomiting x1 upon waking up suddenly. She denies aspiration, states that she swallowed the emesis. She endorses minimal coughing, ongoing nausea, and some bloating. Loose stool x3/day still present. She can still pass gas and appetite still present. She was able to keep her ensure down this AM. She denies CP and SOB.   OBJECTIVE:  Vital Signs: Vitals:   12/19/20 2322 12/20/20 0359 12/20/20 0804 12/20/20 1124  BP: 116/66 (!) 141/74 132/70 117/75  Pulse: 86 73 97 (!) 106  Resp: 18 16 18 17   Temp: 98 F (36.7 C) 98.4 F (36.9 C) 97.8 F (36.6 C) 98.7 F (37.1 C)  TempSrc: Oral Oral Oral Oral  SpO2: 98% 96% 98% 96%  Weight:      Height:       Supplemental O2:  Room Air SpO2: 96 %  Filed Weights   12/12/20 1400 12/12/20 1431 12/14/20 0009  Weight: 81.8 kg 86.2 kg 72.9 kg   No intake or output data in the 24 hours ending 12/20/20 1304 Net IO Since Admission: -1,075.19 mL [12/20/20 1304]  Physical Exam: Physical Exam Vitals and nursing note reviewed.  HENT:     Head: Normocephalic and atraumatic.  Pulmonary:     Effort: Pulmonary effort is normal. No respiratory distress.  Abdominal:     General: There is distension.     Tenderness: There is abdominal tenderness in the right lower quadrant and suprapubic area. There is no guarding or rebound.  Skin:    General: Skin is warm and dry.  Neurological:     General: No focal deficit present.     Mental  Status: She is alert and oriented to person, place, and time. Mental status is at baseline.  Psychiatric:        Mood and Affect: Mood normal.        Behavior: Behavior normal.    ASSESSMENT/PLAN:  Assessment: Primary problem: Infected stage 4 pressure ulcer of R ischium associated with osteomyelitis. HD 7   Plan: #Acute osteomyelitis 2/2 right ischial pressure ulcer  2/2 to being chronically bed-bound due to her MS. Right ischial tuberosity osteomyelitis as evident on MRI, no abscess noted.  No roll for surgery.  Pending SNF placement for wound care. Medically stable.  - Continue Augmentin 800 mg q12h for 2wks and doxycycline 100 mg q12h for 2wks (end 10/20) - Outpatient follow up with ID - Dilaudid 0.5 mg q4h PRN for severe pain  #Nausea/Vomiting New onset and sudden nausea with emesis x1. Monitor.  - Abdominal Xray to evaluate bowel gas pattern - CBC - BMP  #Diarrhea Secondary to medication adverse reaction (primarily Augmentin side effect). Goal is to maintain soft but formed stools, to avoid contamination of wound, but also to avoid severe constipation which she recently experienced. -Imodium 4 mg PRN  #Multiple sclerosis - Continue home baclofen and gabapentin  Best Practice: Diet: Regular diet Pain: Tylenol 650 mg q6h sch, oxycodone prn IVF: None VTE: heparin injection 5,000 Units Start: 12/12/20  1646 Code: Full AB: Augmentin 800 mg every 12 hours for 28 doses and doxycycline 100 mg every 12 hours for 28 doses. (End 10/20)  DISPO: Anticipated discharge to SNF pending placement  Signature: Princess Bruins, DO Psychiatry Resident, PGY-1 [name] Teaching Service Pager: [phone]  After 5pm on weekdays and 1pm on weekends: On Call pager [phone] 12/20/2020, 1:04 PM

## 2020-12-20 NOTE — TOC Progression Note (Signed)
Transition of Care [facility]) - Progression Note    Patient Details  Name: [name] MRN: [id number] Date of Birth: 1971/01/26  Transition of Care [facility]) CM/SW Contact  Eduard Roux, [place] Phone Number: 12/20/2020, 11:44 AM  Clinical Narrative:     CSW submitted insurance authorization for SNF.  CSW will notify medical team once determination has been made.   Antony Blackbird, MSW, LCSW Clinical Social Worker    Expected Discharge Plan: Skilled Nursing Facility Barriers to Discharge: English as a second language teacher, Continued Medical Work up, SNF Pending bed offer  Expected Discharge Plan and Services Expected Discharge Plan: Skilled Nursing Facility In-house Referral: Clinical Social Work                                             Social Determinants of Health (SDOH) Interventions    Readmission Risk Interventions No flowsheet data found.

## 2020-12-21 LAB — BASIC METABOLIC PANEL
Anion gap: 8 (ref 5–15)
BUN: 14 mg/dL (ref 6–20)
CO2: 29 mmol/L (ref 22–32)
Calcium: 9.4 mg/dL (ref 8.9–10.3)
Chloride: 100 mmol/L (ref 98–111)
Creatinine, Ser: 0.76 mg/dL (ref 0.44–1.00)
GFR, Estimated: >60 mL/min (ref >60)
Glucose, Bld: 103 mg/dL — ABNORMAL HIGH (ref 70–99)
Potassium: 4.4 mmol/L (ref 3.5–5.1)
Sodium: 137 mmol/L (ref 135–145)

## 2020-12-21 LAB — PHOSPHORUS: Phosphorus: 4 mg/dL (ref 2.5–4.6)

## 2020-12-21 LAB — CBC
HCT: 41.2 % (ref 36.0–46.0)
Hemoglobin: 12.9 g/dL (ref 12.0–15.0)
MCH: 29 pg (ref 26.0–34.0)
MCHC: 31.3 g/dL (ref 30.0–36.0)
MCV: 92.6 fL (ref 80.0–100.0)
Platelets: 194 10*3/uL (ref 150–400)
RBC: 4.45 MIL/uL (ref 3.87–5.11)
RDW: 17 % — ABNORMAL HIGH (ref 11.5–15.5)
WBC: 7.9 10*3/uL (ref 4.0–10.5)
nRBC: 0 % (ref 0.0–0.2)

## 2020-12-21 LAB — MAGNESIUM: Magnesium: 1.8 mg/dL (ref 1.7–2.4)

## 2020-12-21 MED ORDER — SENNOSIDES-DOCUSATE SODIUM 8.6-50 MG PO TABS
2.0000 | ORAL_TABLET | Freq: Two times a day (BID) | ORAL | Status: DC
  Administered 2020-12-21 – 2020-12-25 (×8): 2 via ORAL
  Filled 2020-12-21 (×8): qty 2

## 2020-12-21 NOTE — TOC Progression Note (Signed)
Transition of Care [facility]) - Progression Note    Patient Details  [name] MRN: [id number] Date of Birth: 09/14/1970  Transition of Care [facility]) CM/SW Contact  Eduard Roux, [place] Phone Number: 12/21/2020, 1:57 PM  Clinical Narrative:     Received insurance authorization # [id number] Auth ID [id number] 12-21-1015 for [facility] /SNF  CSW will continue to follow and assist with discharge planning.  Antony Blackbird, MSW, LCSW Clinical Social Worker    Expected Discharge Plan: Skilled Nursing Facility Barriers to Discharge: English as a second language teacher, Continued Medical Work up, SNF Pending bed offer  Expected Discharge Plan and Services Expected Discharge Plan: Skilled Nursing Facility In-house Referral: Clinical Social Work                                             Social Determinants of Health (SDOH) Interventions    Readmission Risk Interventions No flowsheet data found.

## 2020-12-21 NOTE — Progress Notes (Signed)
HD 8  SUBJECTIVE:  Patient Summary: [name] is a 50 y.o. female, with pertinent PMHx of multiple sclerosis for the past 16 years, complicated by severe debility, bedbound since 2015, and hypertension presents to [facility] for worsening right gluteal ulcer and admitted for stage 4 ischial pressure ulcer and osteomyelitis of the right ischial tuberosity.  Overnight Events: No acute events overnight.  Interim History: [name] who was seen and evaluated at the bedside this AM. States that she slept well and feels better than yesterday. States that she had 1x loose stool yesterday and is able to pass gas. States that there was stool on the purewick after her bowel movement today. She denies N/V, bloating, abdominal pain, SOB, cough, chest pain, dysuria, urgency, frequency. Discussed with patient our concern of stool contaminating her wound if she is not able to fully evacuate her bowels, and the encopresis continues.  OBJECTIVE:  Vital Signs: Vitals:   12/20/20 1959 12/20/20 2343 12/21/20 0509 12/21/20 0744  BP: 105/60 121/71 124/66 125/65  Pulse: 76 71 77 86  Resp: 15 16 14 15   Temp: 98 F (36.7 C) 98 F (36.7 C) 98.3 F (36.8 C) (!) 97.5 F (36.4 C)  TempSrc: Oral Oral Oral Oral  SpO2: 95% 94% 94% 94%  Weight:      Height:       Supplemental O2:  Room Air SpO2: 94 %  Filed Weights   12/12/20 1400 12/12/20 1431 12/14/20 0009  Weight: 81.8 kg 86.2 kg 72.9 kg    Intake/Output Summary (Last 24 hours) at 12/21/2020 1328 Last data filed at 12/21/2020 0420 Gross per 24 hour  Intake --  Output 850 ml  Net -850 ml   Net IO Since Admission: -1,925.19 mL [12/21/20 1328]  Physical Exam: Physical Exam Vitals and nursing note reviewed.  HENT:     Head: Normocephalic and atraumatic.  Cardiovascular:     Rate and Rhythm: Normal rate and regular rhythm.  Pulmonary:     Effort: Pulmonary effort is normal. No respiratory distress.     Breath sounds: Normal  breath sounds.  Abdominal:     General: Bowel sounds are normal. There is no distension.     Palpations: Abdomen is soft.     Tenderness: There is abdominal tenderness (Over the site of heparin shot) in the right lower quadrant. There is no guarding or rebound.  Skin:    General: Skin is warm and dry.     Comments: Lateral skin surface of feet bilaterally are shiny, un-callused, wrinkly, increased skin mobility, blanching, capillary refill greater than 3 seconds  Neurological:     General: No focal deficit present.     Mental Status: She is alert and oriented to person, place, and time. Mental status is at baseline.  Psychiatric:        Mood and Affect: Mood normal.        Behavior: Behavior normal.    ASSESSMENT/PLAN:  Assessment: Primary problem: Infected stage 4 pressure ulcer of R ischium associated with osteomyelitis. HD 8   Plan: #Acute osteomyelitis 2/2 right ischial pressure ulcer  2/2 to being chronically bed-bound due to her MS. Right ischial tuberosity osteomyelitis as evident on MRI, no abscess noted. No roll for surgery. Pending SNF placement for wound care. Medically stable.  - Continue Augmentin 800 mg q12h for 2wks and doxycycline 100 mg q12h for 2wks (end 10/20) - Outpatient follow up with ID  #Encopresis 2/2 Opioid-induced ileus New onset  and sudden nausea with emesis x1 on 10/12 evident on KUB and large stool burden.  Patient is still able to have some bowel movement and passed gas, will progress on diet to clear liquids and assess tolerance. Will consider starting naloxegol, and have repeat KUB. Major concern is fecal contamination of wound, if unable to resolve the constipation medically, we will consider contacting surgery for temporary ostomy. -Clear liquid diet -Holding meds that decrease bowel movements such as Imodium, Zofran, opioids  #Pressure wound  Exam Per above, present on lateral surface of feet bilaterally is concerning for new pressure wound  formation. No skin breakdown at present.  -Heel protectors ordered  Multiple sclerosis - Continue home baclofen and gabapentin  Best Practice: Diet: Clear liquids Pain: Tylenol 1000 mg q6h sch and meloxicam 7.5 mg sch IVF: None VTE: heparin injection 5,000 Units Start: 12/12/20 1646 Code: Full AB: Augmentin 800 mg every 12 hours for 28 doses and doxycycline 100 mg every 12 hours for 28 doses. (End 10/20)  DISPO: Anticipated discharge to SNF pending placement  Signature: Princess Bruins, DO Psychiatry Resident, PGY-1 [name] Teaching Service Pager: [phone]  After 5pm on weekdays and 1pm on weekends: On Call pager [phone] 12/21/2020, 1:28 PM

## 2020-12-21 NOTE — Care Management Important Message (Signed)
Important Message  Patient Details  Name: [name] MRN: [id number] Date of Birth: 1970-06-19   Medicare Important Message Given:  Yes     Renie Ora 12/21/2020, 9:44 AM

## 2020-12-22 ENCOUNTER — Inpatient Hospital Stay (HOSPITAL_COMMUNITY): Payer: Medicare PPO

## 2020-12-22 DIAGNOSIS — M8618 Other acute osteomyelitis, other site: Secondary | ICD-10-CM | POA: Diagnosis not present

## 2020-12-22 DIAGNOSIS — L89314 Pressure ulcer of right buttock, stage 4: Secondary | ICD-10-CM | POA: Diagnosis not present

## 2020-12-22 DIAGNOSIS — T402X5A Adverse effect of other opioids, initial encounter: Secondary | ICD-10-CM | POA: Diagnosis not present

## 2020-12-22 DIAGNOSIS — G35 Multiple sclerosis: Secondary | ICD-10-CM

## 2020-12-22 DIAGNOSIS — Z7401 Bed confinement status: Secondary | ICD-10-CM

## 2020-12-22 DIAGNOSIS — L89609 Pressure ulcer of unspecified heel, unspecified stage: Secondary | ICD-10-CM

## 2020-12-22 DIAGNOSIS — K5903 Drug induced constipation: Secondary | ICD-10-CM | POA: Diagnosis not present

## 2020-12-22 MED ORDER — SODIUM CHLORIDE 0.9 % IV BOLUS
500.0000 mL | Freq: Once | INTRAVENOUS | Status: AC
  Administered 2020-12-22: 500 mL via INTRAVENOUS

## 2020-12-22 MED ORDER — INFLUENZA VAC SPLIT QUAD 0.5 ML IM SUSY: 0.5000 mL | PREFILLED_SYRINGE | INTRAMUSCULAR | Status: DC

## 2020-12-22 MED ORDER — INFLUENZA VAC SPLIT QUAD 0.5 ML IM SUSY
0.5000 mL | PREFILLED_SYRINGE | Freq: Once | INTRAMUSCULAR | Status: AC
  Administered 2020-12-22: 0.5 mL via INTRAMUSCULAR
  Filled 2020-12-22: qty 0.5

## 2020-12-22 NOTE — TOC Progression Note (Signed)
Transition of Care [facility]) - Progression Note    Patient Details  Name: [name] MRN: [id number] Date of Birth: [birth year]  Transition of Care [facility]) CM/SW Contact  Eduard Roux, [place] Phone Number: 12/22/2020, 1:39 PM  Clinical Narrative:     Patient insurance approved [id number] reference [id number] 10/13-10/17- will d/c to [facility] on Monday.   Weekend CSW will request  covid test Sunday 101/16  Antony Blackbird, MSW, LCSW Clinical Social Worker    Expected Discharge Plan: Skilled Nursing Facility Barriers to Discharge: English as a second language teacher, Continued Medical Work up, SNF Pending bed offer  Expected Discharge Plan and Services Expected Discharge Plan: Skilled Nursing Facility In-house Referral: Clinical Social Work                                             Social Determinants of Health (SDOH) Interventions    Readmission Risk Interventions No flowsheet data found.

## 2020-12-22 NOTE — Progress Notes (Addendum)
HD 9  SUBJECTIVE:  Patient Summary: [name] is a 50 y.o. female, with pertinent PMHx of multiple sclerosis for the past 16 years, complicated by severe debility, bedbound since 2015, and hypertension presents to [facility] for worsening right gluteal ulcer and admitted for stage 4 ischial pressure ulcer and osteomyelitis of the right ischial tuberosity.  Overnight Events: No acute events overnight.  Interim History: [name] who was seen and evaluated at the bedside this AM.  States that she continues to sleep well.  States that she still able to have some bowel movement and have flatus.  Etc. pain is well controlled.  She continues to denies N/V, bloating, abdominal pain, SOB, cough, chest pain, dysuria, urgency, frequency.  Discussed with patient that she may need a possible enema to help evacuate her bowels, patient is amenable.  OBJECTIVE:  Vital Signs: Vitals:   12/21/20 2344 12/22/20 0404 12/22/20 0756 12/22/20 1157  BP: 130/65 115/65 (!) 97/49 (!) 109/57  Pulse: 79 80 81 82  Resp: 18 17 18 19   Temp: 98.3 F (36.8 C) 98.7 F (37.1 C) 98.8 F (37.1 C) 98.9 F (37.2 C)  TempSrc: Oral Oral Oral Oral  SpO2: 99% 97% 98% 99%  Weight:      Height:       Supplemental O2:  Room Air SpO2: 99 %  Filed Weights   12/12/20 1400 12/12/20 1431 12/14/20 0009  Weight: 81.8 kg 86.2 kg 72.9 kg    Intake/Output Summary (Last 24 hours) at 12/22/2020 1614 Last data filed at 12/22/2020 1318 Gross per 24 hour  Intake 1919.73 ml  Output 1100 ml  Net 819.73 ml   Net IO Since Admission: -1,105.46 mL [12/22/20 1614]  Physical Exam: Physical Exam Vitals and nursing note reviewed.  HENT:     Head: Normocephalic and atraumatic.  Cardiovascular:     Rate and Rhythm: Normal rate and regular rhythm.  Pulmonary:     Effort: Pulmonary effort is normal. No respiratory distress.     Breath sounds: Normal breath sounds.  Abdominal:     General: Bowel sounds are normal.  There is no distension.     Palpations: Abdomen is soft.     Tenderness: There is no guarding or rebound.  Skin:    General: Skin is warm and dry.  Neurological:     General: No focal deficit present.     Mental Status: She is alert and oriented to person, place, and time. Mental status is at baseline.  Psychiatric:        Mood and Affect: Mood normal.        Behavior: Behavior normal.    ASSESSMENT/PLAN:  Assessment: Primary problem: Infected stage 4 pressure ulcer of R ischium associated with osteomyelitis. HD 9   Plan: #Acute osteomyelitis 2/2 right ischial pressure ulcer  2/2 to being chronically bed-bound due to her MS. Right ischial tuberosity osteomyelitis as evident on MRI, no abscess noted. No roll for surgery. Pending SNF placement for wound care. Medically stable.  - Continue Augmentin 800 mg q12h for 2wks and doxycycline 100 mg q12h for 2wks (end 10/20) - Outpatient follow up with ID  #Encopresis 2/2 Opioid-induced ileus New onset and sudden nausea with emesis x1 on 10/12 evident on KUB and large stool burden.  Patient is still able to have some bowel movement and have flatus. Will consider starting naloxegol, and have repeat KUB.  Major concern is fecal contamination of wound, if unable to resolve the  constipation medically, we will consider contacting surgery for temporary ostomy.  If Senokot fails, will progress to soapsuds enema. -Clear liquid diet -Holding meds that decrease bowel movements such as Imodium, Zofran, opioids -Senokot 2 tablets twice daily  #Pressure wound  Exam Per above, present on lateral surface of feet bilaterally is concerning for new pressure wound formation. No skin breakdown at present.  -Heel protectors ordered  Multiple sclerosis - Continue home baclofen and gabapentin  Best Practice: Diet: Clear liquids Pain: Tylenol 1000 mg q6h sch and meloxicam 7.5 mg sch IVF: None VTE: heparin injection 5,000 Units Start: 12/12/20 1646 Code:  Full AB: Augmentin 800 mg every 12 hours for 28 doses and doxycycline 100 mg every 12 hours for 28 doses. (End 10/20)  DISPO: Anticipated discharge to [facility] on 10/17  Signature: Princess Bruins, DO Psychiatry Resident, PGY-1 [name] Teaching Service Pager: [phone]  After 5pm on weekdays and 1pm on weekends: On Call pager [phone] 12/22/2020, 4:14 PM

## 2020-12-23 DIAGNOSIS — K5903 Drug induced constipation: Secondary | ICD-10-CM | POA: Diagnosis not present

## 2020-12-23 DIAGNOSIS — T402X5A Adverse effect of other opioids, initial encounter: Secondary | ICD-10-CM | POA: Diagnosis not present

## 2020-12-23 DIAGNOSIS — L89314 Pressure ulcer of right buttock, stage 4: Secondary | ICD-10-CM | POA: Diagnosis not present

## 2020-12-23 DIAGNOSIS — M8618 Other acute osteomyelitis, other site: Secondary | ICD-10-CM | POA: Diagnosis not present

## 2020-12-23 DIAGNOSIS — R197 Diarrhea, unspecified: Secondary | ICD-10-CM

## 2020-12-23 MED ORDER — DOXYCYCLINE HYCLATE 100 MG PO TABS
100.0000 mg | ORAL_TABLET | Freq: Two times a day (BID) | ORAL | Status: DC
  Administered 2020-12-23 – 2020-12-25 (×4): 100 mg via ORAL
  Filled 2020-12-23 (×4): qty 1

## 2020-12-23 NOTE — Progress Notes (Signed)
HD 10  SUBJECTIVE:  Patient Summary: [name] is a 50 y.o. female, with pertinent PMHx of multiple sclerosis for the past 16 years, complicated by severe debility, bedbound since 2015, and hypertension presents to [facility] for worsening right gluteal ulcer and admitted for stage 4 ischial pressure ulcer and osteomyelitis of the right ischial tuberosity.  Overnight Events: No acute events overnight.  Interim History: [name] was seen and evaluated at the bedside this AM. She denies any acute complaints at this time. She endorses continued bowel movements that seem more formed. She feels ready to advance her diet.   OBJECTIVE:  Vital Signs: Vitals:   12/22/20 1943 12/22/20 2310 12/23/20 0327 12/23/20 0743  BP: (!) 93/48 132/62 111/60 123/60  Pulse:  88  84  Resp: 17 16 18 16   Temp: 97.9 F (36.6 C) 98.1 F (36.7 C) 98 F (36.7 C) 98.2 F (36.8 C)  TempSrc: Oral Oral Oral Oral  SpO2: 99% 100% 100% 100%  Weight:      Height:       Supplemental O2:  Room Air SpO2: 100 %  Filed Weights   12/12/20 1400 12/12/20 1431 12/14/20 0009  Weight: 81.8 kg 86.2 kg 72.9 kg    Intake/Output Summary (Last 24 hours) at 12/23/2020 1034 Last data filed at 12/23/2020 12/25/2020 Gross per 24 hour  Intake 1713.87 ml  Output 2350 ml  Net -636.13 ml    Net IO Since Admission: -2,461.59 mL [12/23/20 1034]  Physical Exam: Physical Exam Vitals and nursing note reviewed.  HENT:     Head: Normocephalic and atraumatic.  Cardiovascular:     Rate and Rhythm: Normal rate and regular rhythm.  Pulmonary:     Effort: Pulmonary effort is normal. No respiratory distress.     Breath sounds: Normal breath sounds.  Abdominal:     General: Bowel sounds are normal. There is no distension.     Palpations: Abdomen is soft.     Tenderness: There is no guarding or rebound.  Skin:    General: Skin is warm and dry.  Neurological:     General: No focal deficit present.     Mental Status:  She is alert and oriented to person, place, and time. Mental status is at baseline.  Psychiatric:        Mood and Affect: Mood normal.        Behavior: Behavior normal.    ASSESSMENT/PLAN:  Assessment: Primary problem: Infected stage 4 pressure ulcer of R ischium associated with osteomyelitis. HD 10   Plan: # Acute Right Ischial Osteomyelitis  In the setting of being bed bound with development of pressure ulcers, exacerbated by inadequate wound care at home. Orthopedic and plastic surgery do not recommend surgical intervention. ID recommend conservative management with PO antibiotics for now. Pending SNF placement for wound care.  - Continue Augmentin 800 mg q12h for 2wks and doxycycline 100 mg q12h for 2wks (end 10/20) - Outpatient follow up with ID  # Overflow Diarrhea  # Acute on Chronic Constipation Chronic constipation previously complicated by urinary obstruction with acute worsening in the setting of opioid therapy. Potential ileus during admission however patient continues to have BM. KUB with improving stool burden.   - Advance diet as tolerated to regular  - Continue to avoid medications that decrease bowel movements such as Imodium, Zofran, opioids - Senokot 2 tablets twice daily  # Multiple sclerosis Bed bound for multiple years now.   - Continue home  baclofen and gabapentin - Monitor for evolving pressure wounds   Best Practice: Diet: Clear liquids. Advance as tolerated to regular  Pain: Tylenol 1000 mg q6h sch and meloxicam 7.5 mg sch IVF: None VTE: heparin injection 5,000 Units Start: 12/12/20 1646 Code: Full AB: Augmentin 800 mg every 12 hours for 28 doses and doxycycline 100 mg every 12 hours for 28 doses. (End 10/20)  DISPO: Anticipated discharge to [facility] on 10/17  Signature: Dr. Verdene Lennert Internal Medicine PGY-3  Pager: [phone] After 5pm on weekdays and 1pm on weekends: On Call pager [phone]  12/23/2020, 2:07 PM

## 2020-12-24 LAB — SARS CORONAVIRUS 2 (TAT 6-24 HRS): SARS Coronavirus 2: NEGATIVE

## 2020-12-24 NOTE — Progress Notes (Signed)
HD 11  SUBJECTIVE:  Patient Summary: [name] is a 50 y.o. female, with pertinent PMHx of multiple sclerosis for the past 16 years, bedbound since 2015, severe chronic constipation, and hypertension presents to [facility] for worsening right gluteal ulcer and admitted for right ischial osteomyelitis 2/2 chronic pressure wound.  Overnight Events: No acute events overnight.  Interim History: [name] was seen and evaluated at the bedside this AM. States that she slept well and that she had multiple formed bowel movements overnight. States that her pain is well controlled on current regiment, and that she is tolerating her diet well. She denies CP, SOB, abdominal pain, and N/V.  OBJECTIVE:  Vital Signs: Vitals:   12/23/20 2300 12/24/20 0440 12/24/20 0752 12/24/20 1155  BP: 119/81 114/75 116/68 (!) 109/59  Pulse: 95 88 80 90  Resp: 16 16 16 16   Temp: 98.8 F (37.1 C)  98.6 F (37 C) 98 F (36.7 C)  TempSrc: Oral  Oral Oral  SpO2: 97% 98% 100% 99%  Weight:      Height:       Supplemental O2:  Room Air SpO2: 99 %  Filed Weights   12/12/20 1400 12/12/20 1431 12/14/20 0009  Weight: 81.8 kg 86.2 kg 72.9 kg    Intake/Output Summary (Last 24 hours) at 12/24/2020 1555 Last data filed at 12/24/2020 0527 Gross per 24 hour  Intake --  Output 1000 ml  Net -1000 ml   Net IO Since Admission: -3,624.59 mL [12/24/20 1555]  Physical Exam: Physical Exam Vitals and nursing note reviewed.  HENT:     Head: Normocephalic and atraumatic.  Cardiovascular:     Rate and Rhythm: Normal rate and regular rhythm.     Heart sounds: Normal heart sounds. No murmur heard. Pulmonary:     Effort: Pulmonary effort is normal. No respiratory distress.     Breath sounds: Normal breath sounds. No wheezing.  Abdominal:     General: Bowel sounds are normal. There is no distension.     Palpations: Abdomen is soft.     Tenderness: There is no abdominal tenderness. There is no guarding  or rebound.  Musculoskeletal:     Cervical back: Normal range of motion.  Skin:    General: Skin is warm and dry.  Neurological:     Mental Status: She is alert and oriented to person, place, and time. Mental status is at baseline.  Psychiatric:        Mood and Affect: Mood normal.        Behavior: Behavior normal.    ASSESSMENT/PLAN:  Assessment: [name] is a 50 y.o. female, with pertinent PMHx of multiple sclerosis for the past 16 years, bedbound since 2015, severe chronic constipation, and hypertension presents to [facility] for worsening right gluteal ulcer and admitted for right ischial osteomyelitis 2/2 chronic pressure wound. HD 11   Primary problem: Infected stage 4 pressure ulcer of R ischium associated with osteomyelitis.  Plan: # Acute Right Ischial Osteomyelitis  In the setting of being bed bound with development of pressure ulcers, exacerbated by inadequate wound care at home. Orthopedic and plastic surgery do not recommend surgical intervention. ID recommend conservative management with PO antibiotics for now. Pending SNF placement for wound care. - Continue Augmentin 800 mg q12h for 2wks and doxycycline 100 mg q12h for 2wks (end 10/20) - Outpatient follow up with ID  # Overflow Diarrhea  # Acute on Chronic Constipation Chronic constipation previously complicated by  urinary obstruction with acute worsening in the setting of opioid therapy. Potential ileus during admission however patient continues to have BM. KUB with improving stool burden. With updated bowel regimen, she has had more formed stools, however patient's finds it difficult to accurately describe her stools. Will consider soap sud enema, but will hold off for now. - Advance diet as tolerated to regular  - Senokot 2 tablets twice daily - Continue to avoid medications that decrease bowel movements such as Imodium, Zofran, opioids  # Multiple sclerosis Bed bound for multiple years now. States that her  pain is well controlled without opioids. - Continue home baclofen and gabapentin - Monitor for evolving pressure wounds   Best Practice: Diet: Advance as tolerated to regular  Pain: Tylenol 1000 mg q6h sch and meloxicam 7.5 mg sch IVF: None VTE: heparin injection 5,000 Units Start: 12/12/20 1646 Code: Full AB: Augmentin 800 mg every 12 hours for 28 doses and doxycycline 100 mg every 12 hours for 28 doses. (End 10/20)  DISPO: Anticipated discharge to [facility] on 10/17  Signature: Princess Bruins, DO Psychiatry Resident, PGY-1 [name] Pager: [phone]  After 5pm on weekdays and 1pm on weekends: On Call pager [phone]  12/24/2020, 3:55 PM

## 2020-12-25 ENCOUNTER — Telehealth: Payer: Self-pay

## 2020-12-25 ENCOUNTER — Other Ambulatory Visit (HOSPITAL_COMMUNITY): Payer: Self-pay

## 2020-12-25 DIAGNOSIS — B965 Pseudomonas (aeruginosa) (mallei) (pseudomallei) as the cause of diseases classified elsewhere: Secondary | ICD-10-CM | POA: Diagnosis present

## 2020-12-25 DIAGNOSIS — R4182 Altered mental status, unspecified: Secondary | ICD-10-CM | POA: Diagnosis present

## 2020-12-25 DIAGNOSIS — Z23 Encounter for immunization: Secondary | ICD-10-CM | POA: Diagnosis present

## 2020-12-25 DIAGNOSIS — R059 Cough, unspecified: Secondary | ICD-10-CM | POA: Diagnosis not present

## 2020-12-25 DIAGNOSIS — R531 Weakness: Secondary | ICD-10-CM | POA: Diagnosis not present

## 2020-12-25 DIAGNOSIS — L89314 Pressure ulcer of right buttock, stage 4: Secondary | ICD-10-CM | POA: Diagnosis present

## 2020-12-25 DIAGNOSIS — M6281 Muscle weakness (generalized): Secondary | ICD-10-CM | POA: Diagnosis not present

## 2020-12-25 DIAGNOSIS — M868X8 Other osteomyelitis, other site: Secondary | ICD-10-CM | POA: Diagnosis present

## 2020-12-25 DIAGNOSIS — J9811 Atelectasis: Secondary | ICD-10-CM | POA: Diagnosis not present

## 2020-12-25 DIAGNOSIS — K5900 Constipation, unspecified: Secondary | ICD-10-CM | POA: Diagnosis not present

## 2020-12-25 DIAGNOSIS — Z1619 Resistance to other specified beta lactam antibiotics: Secondary | ICD-10-CM | POA: Diagnosis present

## 2020-12-25 DIAGNOSIS — G8220 Paraplegia, unspecified: Secondary | ICD-10-CM | POA: Diagnosis present

## 2020-12-25 DIAGNOSIS — G35 Multiple sclerosis: Secondary | ICD-10-CM | POA: Diagnosis present

## 2020-12-25 DIAGNOSIS — M4628 Osteomyelitis of vertebra, sacral and sacrococcygeal region: Secondary | ICD-10-CM | POA: Diagnosis not present

## 2020-12-25 DIAGNOSIS — R0902 Hypoxemia: Secondary | ICD-10-CM | POA: Diagnosis not present

## 2020-12-25 DIAGNOSIS — Z7401 Bed confinement status: Secondary | ICD-10-CM | POA: Diagnosis not present

## 2020-12-25 DIAGNOSIS — Z741 Need for assistance with personal care: Secondary | ICD-10-CM | POA: Diagnosis not present

## 2020-12-25 DIAGNOSIS — Z9109 Other allergy status, other than to drugs and biological substances: Secondary | ICD-10-CM | POA: Diagnosis not present

## 2020-12-25 DIAGNOSIS — U071 COVID-19: Secondary | ICD-10-CM | POA: Diagnosis present

## 2020-12-25 DIAGNOSIS — R278 Other lack of coordination: Secondary | ICD-10-CM | POA: Diagnosis not present

## 2020-12-25 DIAGNOSIS — G8929 Other chronic pain: Secondary | ICD-10-CM | POA: Diagnosis not present

## 2020-12-25 DIAGNOSIS — I10 Essential (primary) hypertension: Secondary | ICD-10-CM | POA: Diagnosis not present

## 2020-12-25 DIAGNOSIS — M869 Osteomyelitis, unspecified: Secondary | ICD-10-CM | POA: Diagnosis not present

## 2020-12-25 DIAGNOSIS — K56609 Unspecified intestinal obstruction, unspecified as to partial versus complete obstruction: Secondary | ICD-10-CM | POA: Diagnosis not present

## 2020-12-25 DIAGNOSIS — K5909 Other constipation: Secondary | ICD-10-CM | POA: Diagnosis present

## 2020-12-25 DIAGNOSIS — L8995 Pressure ulcer of unspecified site, unstageable: Secondary | ICD-10-CM | POA: Diagnosis not present

## 2020-12-25 DIAGNOSIS — Z791 Long term (current) use of non-steroidal anti-inflammatories (NSAID): Secondary | ICD-10-CM | POA: Diagnosis not present

## 2020-12-25 DIAGNOSIS — N3000 Acute cystitis without hematuria: Secondary | ICD-10-CM | POA: Diagnosis present

## 2020-12-25 DIAGNOSIS — Z87891 Personal history of nicotine dependence: Secondary | ICD-10-CM | POA: Diagnosis not present

## 2020-12-25 DIAGNOSIS — L089 Local infection of the skin and subcutaneous tissue, unspecified: Secondary | ICD-10-CM | POA: Diagnosis not present

## 2020-12-25 DIAGNOSIS — L8990 Pressure ulcer of unspecified site, unspecified stage: Secondary | ICD-10-CM | POA: Diagnosis not present

## 2020-12-25 DIAGNOSIS — G9341 Metabolic encephalopathy: Secondary | ICD-10-CM | POA: Diagnosis present

## 2020-12-25 DIAGNOSIS — Z833 Family history of diabetes mellitus: Secondary | ICD-10-CM | POA: Diagnosis not present

## 2020-12-25 DIAGNOSIS — M25561 Pain in right knee: Secondary | ICD-10-CM | POA: Diagnosis not present

## 2020-12-25 DIAGNOSIS — Z79899 Other long term (current) drug therapy: Secondary | ICD-10-CM | POA: Diagnosis not present

## 2020-12-25 DIAGNOSIS — R1311 Dysphagia, oral phase: Secondary | ICD-10-CM | POA: Diagnosis not present

## 2020-12-25 DIAGNOSIS — R404 Transient alteration of awareness: Secondary | ICD-10-CM | POA: Diagnosis not present

## 2020-12-25 DIAGNOSIS — Z8249 Family history of ischemic heart disease and other diseases of the circulatory system: Secondary | ICD-10-CM | POA: Diagnosis not present

## 2020-12-25 DIAGNOSIS — G629 Polyneuropathy, unspecified: Secondary | ICD-10-CM | POA: Diagnosis not present

## 2020-12-25 DIAGNOSIS — M1990 Unspecified osteoarthritis, unspecified site: Secondary | ICD-10-CM | POA: Diagnosis present

## 2020-12-25 LAB — CBC WITH DIFFERENTIAL/PLATELET
Abs Immature Granulocytes: 0.01 10*3/uL (ref 0.00–0.07)
Basophils Absolute: 0 10*3/uL (ref 0.0–0.1)
Basophils Relative: 0 %
Eosinophils Absolute: 0.1 10*3/uL (ref 0.0–0.5)
Eosinophils Relative: 2 %
HCT: 38.9 % (ref 36.0–46.0)
Hemoglobin: 12.3 g/dL (ref 12.0–15.0)
Immature Granulocytes: 0 %
Lymphocytes Relative: 33 %
Lymphs Abs: 1.9 10*3/uL (ref 0.7–4.0)
MCH: 29.4 pg (ref 26.0–34.0)
MCHC: 31.6 g/dL (ref 30.0–36.0)
MCV: 93.1 fL (ref 80.0–100.0)
Monocytes Absolute: 0.6 10*3/uL (ref 0.1–1.0)
Monocytes Relative: 10 %
Neutro Abs: 3.2 10*3/uL (ref 1.7–7.7)
Neutrophils Relative %: 55 %
Platelets: 186 10*3/uL (ref 150–400)
RBC: 4.18 MIL/uL (ref 3.87–5.11)
RDW: 17.2 % — ABNORMAL HIGH (ref 11.5–15.5)
WBC: 5.8 10*3/uL (ref 4.0–10.5)
nRBC: 0 % (ref 0.0–0.2)

## 2020-12-25 LAB — BASIC METABOLIC PANEL
Anion gap: 7 (ref 5–15)
BUN: 15 mg/dL (ref 6–20)
CO2: 26 mmol/L (ref 22–32)
Calcium: 8.9 mg/dL (ref 8.9–10.3)
Chloride: 102 mmol/L (ref 98–111)
Creatinine, Ser: 0.86 mg/dL (ref 0.44–1.00)
GFR, Estimated: >60 mL/min (ref >60)
Glucose, Bld: 107 mg/dL — ABNORMAL HIGH (ref 70–99)
Potassium: 4.1 mmol/L (ref 3.5–5.1)
Sodium: 135 mmol/L (ref 135–145)

## 2020-12-25 MED ORDER — ZINC OXIDE 12.8 % EX OINT: TOPICAL_OINTMENT | Freq: Two times a day (BID) | CUTANEOUS | 0 refills | Status: DC

## 2020-12-25 MED ORDER — DICLOFENAC SODIUM 1 % EX GEL: 4.0000 g | Freq: Four times a day (QID) | CUTANEOUS | 0 refills | Status: DC

## 2020-12-25 MED ORDER — AMOXICILLIN-POT CLAVULANATE 400-57 MG/5ML PO SUSR: 800.0000 mg | Freq: Two times a day (BID) | ORAL | 0 refills | Status: AC

## 2020-12-25 MED ORDER — DOXYCYCLINE HYCLATE 100 MG PO TABS: 100.0000 mg | ORAL_TABLET | Freq: Two times a day (BID) | ORAL | 0 refills | Status: AC

## 2020-12-25 NOTE — Discharge Summary (Addendum)
Name: [name]   MRN: [id number] DOB: 12-06-1970 50 y.o. PCP: Marletta Lor, NP  Date of Admission: 12/12/2020  1:25 PM Date of Discharge: 12/25/2020 Attending Physician: Dickie La, MD  Discharge Diagnosis: Principal Problem:   Decubitus ulcer, unstageable with infection (HCC) Active Problems:   UTI (urinary tract infection)   Decubitus ulcer   Discharge Medications: Allergies as of 12/25/2020       Reactions   Silver    Tizanidine Diarrhea   Tomato         Medication List     STOP taking these medications    carvedilol 3.125 MG tablet Commonly known as: COREG       TAKE these medications    acetaminophen 325 MG tablet Commonly known as: TYLENOL Take 2 tablets (650 mg total) by mouth every 6 (six) hours as needed for mild pain (or Fever >/= 101).   amoxicillin-clavulanate 400-57 MG/5ML suspension Commonly known as: AUGMENTIN Take 10 mLs (800 mg total) by mouth 2 (two) times daily for 3 days.   baclofen 20 MG tablet Commonly known as: LIORESAL Take 20 mg by mouth 3 (three) times daily.   collagenase ointment Commonly known as: SANTYL Apply topically daily.   diclofenac Sodium 1 % Gel Commonly known as: VOLTAREN Apply 4 g topically 4 (four) times daily. Apply to joint or muscle pains   doxycycline 100 MG tablet Commonly known as: VIBRA-TABS Take 1 tablet (100 mg total) by mouth 2 (two) times daily for 3 days. Give 2 hours before or 4 hours after iron.   feeding supplement Liqd Take 237 mLs by mouth 2 (two) times daily between meals.   gabapentin 300 MG capsule Commonly known as: NEURONTIN Take 300 mg by mouth See admin instructions. 300 mg  morning, noon and dinner 600 mg at bedtime.   meloxicam 7.5 MG tablet Commonly known as: MOBIC Take 7.5 mg by mouth daily.   OVER THE COUNTER MEDICATION Take 1 Scoop by mouth daily as needed (UTI symptoms). Over the counter powder   polyethylene glycol 17 g packet Commonly known as: MIRALAX /  GLYCOLAX Take 17 g by mouth 2 (two) times daily as needed for moderate constipation or mild constipation.   senna-docusate 8.6-50 MG tablet Commonly known as: Senokot-S Take 2 tablets by mouth 2 (two) times daily as needed for mild constipation.   Zinc Oxide 12.8 % ointment Commonly known as: TRIPLE PASTE Apply topically 2 (two) times daily. Apply AROUND sacral wound area. Then follow Dakin's order.               Discharge Care Instructions  (From admission, onward)           Start     Ordered   12/25/20 0000  Discharge wound care:       Comments: As seen in Wound/Pressure Injury   12/25/20 1249           Disposition and follow-up:   [name] was discharged from [facility] in Stable condition. 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 Currently on PO Augmentin and doxycycline per below. End date 10/20. Concerned for possible contamination due to location. Patient is to follow-up GI within 7 days of dc from SNF Chronic constipation. Hx of severe opioid-induced chronic constipation that required hospitalization. Had resulting AKI last hospitalization. Current regiment: Senokot 2 tablets daily + miralax PRN.  This has improved, but opioids were held. When restarting  opioids, please consider Naloxegal for opioid-induced constipation.  Of note, patient has difficulty characterizing BM, see below for details. Pressure wound prevention. Skin thinning and wrinkling of skin on lateral aspect of feet bilaterally, concerned for new pressure wound formation. Patient has palpable rod in right foot. Ordered heel cushion for patient. Pain. Baclofen, gabapentin, meloxicam, voltarean gel, tylenol as below.  Opioids were stopped due to SBO.  She complains of worsening neuropathy on day of DC, please consider Cymbalta. Labs / imaging needed at time of follow-up: CBC, CMP, KUB Pending labs/ test  needing follow-up: N/A  Follow-up Appointments:  Follow-up Information     Marletta Lor, NP Follow up in 2 week(s).   Specialty: Nurse Practitioner Contact information: [facility] [address] [place] [address] [phone]                 Hospital Course by problem list: [name] is a 50 y.o. female, with pertinent PMHx of multiple sclerosis for the past 16 years, bedbound since 2015, severe chronic constipation, and hypertension presents to [facility] for worsening right gluteal ulcer and admitted for right ischial osteomyelitis 2/2 chronic pressure wound. LOS: 12 days  ED Course:  BP 96/62   Pulse 76   Temp 98.2 F (36.8 C) (Oral)   Resp 17   SpO2 97% RA Presented for increased drainage from pressure wound noted by home wound care RN. She was found to have osteomyelitis on right ischial tuberosity on imaging at the site of overlying pressure wound. Infectious disease and orthopedics were consulted.  Hospital Course: Patient was evaluated by orthopedics, plastics, and infectious disease. She was found to not be a surgical or skin flap candidate. Patient was started empiric IV cefepime and IV vancomycin for 1 day, then transitioned to PO Augmentin and doxycycline (start 10/6, end 10/20) for the remainder of hospitalization. She was thought to have a UTI, but repeat urine culture was negative for growth and she was asymptomatic.  Course was complicated by large stool burden resulting in partial SBO, that was resolved with bowel rest and escalating bowel regiment.   Consults: Orthopedic, Plastic, Infectious Disease   Plan:  #Acute Right Ischial Osteomyelitis 2/2 overlying pressure wound In the setting of being bed bound 2/2 MS, exacerbated by inadequate wound care and fecal contamination at home. On admission, there was found to be guaze found in the wound. Confirmed with MRI Orthopedic and plastic surgery did not recommend surgical  intervention due to location and maceration of surrounding skin. Started on IV vancomycin and cefepime then ID recommend conservative management with PO antibiotics for now and follow-up with ID outpatient for reassessment. Recommended home therapy.  Barriers include improving nutrition (albumin 2.3), avoidance of soiling due to location (may require diverting colostomy which was discussed with pt), regular and thorough wound cleaning, offloading wound. Recommended SNF for wound care due to inadequate wound care at home that involves her parents and an RN that comes 1x/week.  - IV Vancomycine and IV cefepime (start 10/5, end 10/6) - Augmentin 800 mg q12h for 2wks and doxycycline 100 mg q12h for 2wks (start 10/6, end 10/20); this can be extended based on response, per ID recs - SNF placement on 10/17 for additional wound care - Ensure in between meals and nutrition consult - Wound care twice daily per wound care nurse, and avoidance of contamination - Zinc oxide (triple paste) ointment - Offloading wound, with frequent turnings and air mattress - Outpatient  follow up with ID within 1 week of dc from SNF for reassessment - Considered possible diverting colostomy to avoid wound contamination  #Chronic constipation  #Non-adhesive SBO v Partial Ileus #Encopresis New onset and sudden nausea with emesis x1 on 10/12, KUB revealed SBO 2/2 large stool burden from chronic opioids. Since admission and prior, patient endorsed chronic diarrhea. However there was discrepancy with patient reports and RN reports. Patient admits that it is difficult for her to characterize her bowel movements and stool. She was started on bowel regiment of Senokot 2 tablets daily and miralax PRN. Considered soap sud enema  and suppository if patient was not having adequate bowel movements. Please insure patient has adequate bowel movements daily, as patient was recently hospitalized 1 month prior for severe constipation that resulted  in AKI.  She seems to have a fear of fecal incontinence and may be resisting bowel movements.  May need diverting colostomy due to frequency and nature of bowel movements.  Opioids were dc substituted for Tylenol 100mg  q6hr. Once large stool burden has resolved, please consider naloxegal for opioid induced constipation if considering reintroduction of opioids. Also concerned for contamination of pure wick leading to possible UTI. Excellent hygiene will be important for this patient. - Senokot 2 tables daily - Miralax PRN - Soap sud enema or suppository PRN if inadequate bowel movement from above regiment. - Consider possible diverting colostomy as stated above - Consider Naloxegal for opioid induced constipation  #Multiple sclerosis #Neuropathy #Chronic right knee pain Had to hold home meloxicam due to AKI which resolved. Restarted at 7.5mg  daily. Started daily Voltarean gel QID daily to affected painful joint. Continued her home baclofen and gabapentin. She did endorse worsening neuropathy on day of discharge, discussed with patient trying Cymbalta. All opioids were dc due to SBO, per above. This was substituted for Tylenol 1000mg  q6hr. - Home baclofen 20mg  TID - Home Gabapentin 300 qAM and qNOON, 600qHS - Meloxicam 7.5mg  PRN daily for pain, please limit to once daily for risk of AKI - Voltarean QID - Tylenol 1000mg  q6hr - Please consider Cymbalta for neuropathic pain.   Discharge Subjective: Patient was seen this AM, states that she slept well and overall feels much improved. States that she was able to tolerate food. She denies abdominal pain, N/V, bloating, obstipation, fever, chills, SOB, dysuria, frequency, urgency. States that her pain is well controlled, except for new worsening neuropathy in fingers bilaterally.   Discharge Exam:   BP (!) 104/57 (BP Location: Right Arm)   Pulse 88   Temp 98.6 F (37 C) (Oral)   Resp 17   Ht 5\' 1"  (1.549 m)   Wt 72.9 kg   SpO2 97%   BMI  30.37 kg/m  Physical Exam Vitals and nursing note reviewed.  Constitutional:      General: She is not in acute distress.    Appearance: She is ill-appearing. She is not diaphoretic.  HENT:     Head: Normocephalic and atraumatic.  Eyes:     Extraocular Movements: Extraocular movements intact.  Cardiovascular:     Rate and Rhythm: Normal rate and regular rhythm.     Pulses: Normal pulses.     Heart sounds: Normal heart sounds. No murmur heard.   No friction rub. No gallop.  Pulmonary:     Effort: Pulmonary effort is normal.     Breath sounds: Normal breath sounds. No wheezing or rales.  Abdominal:     General: Bowel sounds are normal. There is no distension.  Palpations: Abdomen is soft.     Tenderness: There is no abdominal tenderness. There is no guarding or rebound.  Musculoskeletal:     Cervical back: Normal range of motion.     Right lower leg: Edema present.     Left lower leg: Edema present.     Comments: +1 Pitting b/l  Skin:    General: Skin is dry.  Neurological:     Mental Status: She is alert and oriented to person, place, and time.     Pertinent Labs, Studies, and Procedures:  Recent Labs    12/25/20 0046  WBC 5.8  HGB 12.3  HCT 38.9  PLT 186   Recent Labs    12/25/20 0046  NA 135  K 4.1  CL 102  CO2 26  GLUCOSE 107*  BUN 15  CREATININE 0.86  CALCIUM 8.9   DG Abd 1 View  Result Date: 12/22/2020 CLINICAL DATA:  History of constipation EXAM: ABDOMEN - 1 VIEW COMPARISON:  12/20/2020 FINDINGS: Scattered large and small bowel gas is noted. The degree of retained fecal material has improved significantly although persistent fecal material in the left colon is noted. No free air is seen. No obstructive changes are noted. IMPRESSION: Improvement in the degree of colonic constipation when compared with the prior exam. Electronically Signed   By: Alcide Clever M.D.   On: 12/22/2020 23:10   MR PELVIS W WO CONTRAST  Result Date: 12/12/2020 CLINICAL DATA:   Sacral decubitus ulcer. EXAM: MRI PELVIS WITHOUT AND WITH CONTRAST TECHNIQUE: Multiplanar multisequence MR imaging of the pelvis was performed both before and after administration of intravenous contrast. CONTRAST:  8.90mL GADAVIST GADOBUTROL 1 MMOL/ML IV SOLN COMPARISON:  CT abdomen pelvis dated November 12, 2020. MRI sacrum dated January 27, 2019. FINDINGS: Bones/Joint/Cartilage Abnormal marrow edema and enhancement with corresponding decreased T1 marrow signal involving the right ischial tuberosity no fracture or dislocation. Chronically eroded distal sacrum and coccyx, unchanged. No hip joint effusion. Muscles and Tendons Diffuse pelvic muscle atrophy. Soft tissue Deep right ischial decubitus ulcer extending to bone. No fluid collection. Reactive right inguinal lymph nodes. IMPRESSION: 1. Deep right ischial decubitus ulcer extending to bone with underlying osteomyelitis of the right ischial tuberosity. No abscess. Electronically Signed   By: Obie Dredge M.D.   On: 12/12/2020 20:04   DG Abd Portable 1V  Result Date: 12/20/2020 CLINICAL DATA:  Vomiting and diarrhea. EXAM: PORTABLE ABDOMEN - 1 VIEW COMPARISON:  None. FINDINGS: Multiple dilated small bowel loops are seen within the mid and lower abdomen. A large stool burden is noted. There is no evidence of free air. No radio-opaque calculi or other significant radiographic abnormality are seen. IMPRESSION: Large stool burden with additional findings consistent with small-bowel obstruction versus ileus. Electronically Signed   By: Aram Candela M.D.   On: 12/20/2020 15:59    Discharge Instructions:   Discharge Instructions      Dear [name], I am so glad you are feeling better and can be discharged today (12/25/2020)! You were admitted because of a bone infection (osteomyelitis) from the wound on your backside.   Please see the following instructions: Please see your primary care / family doctor within 1 WEEK of leaving the hospital  for a HOSPITAL FOLLOW-UP APPOINTMENT. Please call and make an appointment with the Infectious Disease Doctor within 7 DAYS of leaving the rehab facility, to check up on your wound and antibiotics.  NEW meds:  ANTIBIOTICS for your WOUND: Augmentin 800mg  twice daily for 2  weeks(end 10/20) Doxycycline 100mg  twice daily for 2 weeks (end 10/20) CHRONIC CONSTIPATION: Please make sure you are having at LEAST 1 bowel movement every other day, but ideally DAILY, due to past hospitalization due to the chronic constipation Please take Senokot 2 tablets daily If you are having too many bowel movements, feel free to decrease to 1 tablet the next day, but please then resume 2 tablets daily. If your bowel movements are small in size or small amounts, please add the miralax + Senokot 2 tablets until you have an adequate bowel movement Please do not go more than 2 DAYS without moderate amount of bowel movement. If you feel nauseous or vomit, please see your doctor or go to ED to see if you have a blockage again due to your stool. PAIN Voltaren gel Apply to painful joints at least 4 times daily. This will help your pain, without the side effects of pain medicines in pill form. For best effect, apply regularly and daily. CONTINUE home meds: Continue other home meds as needed for pain. Please be careful with your meloxicam, as it can cause kidney damage. Please try to limit 1x meloxicam daily. WOUND CARE You will be going to a rehab facility with wound care, once discharged from there, please request a detailed instruction for wound care when you go home.  It was a pleasure meeting you, [name].  I wish you and your family the best, and hope you stay happy and healthy! Julia Bruins, DO 12/25/2020       Signed: Princess Bruins, DO Psychiatry Resident, PGY-1 [name] 12/25/2020, 12:54 PM   Pager: [phone]

## 2020-12-25 NOTE — Progress Notes (Signed)
Pt vomited after receiving morning meds. States it was just the taste of one of the medications  Brooke Pace, RN

## 2020-12-25 NOTE — Discharge Instructions (Signed)
Dear [name], I am so glad you are feeling better and can be discharged today (12/25/2020)! You were admitted because of a bone infection (osteomyelitis) from the wound on your backside.   Please see the following instructions: Please see your primary care / family doctor within 1 WEEK of leaving the hospital for a HOSPITAL FOLLOW-UP APPOINTMENT. Please call and make an appointment with the Infectious Disease Doctor within 7 DAYS of leaving the rehab facility, to check up on your wound and antibiotics.  NEW meds:  ANTIBIOTICS for your WOUND: Augmentin 800mg  twice daily for 2 weeks(end 10/20) Doxycycline 100mg  twice daily for 2 weeks (end 10/20) CHRONIC CONSTIPATION: Please make sure you are having at LEAST 1 bowel movement every other day, but ideally DAILY, due to past hospitalization due to the chronic constipation Please take Senokot 2 tablets daily If you are having too many bowel movements, feel free to decrease to 1 tablet the next day, but please then resume 2 tablets daily. If your bowel movements are small in size or small amounts, please add the miralax + Senokot 2 tablets until you have an adequate bowel movement Please do not go more than 2 DAYS without moderate amount of bowel movement. If you feel nauseous or vomit, please see your doctor or go to ED to see if you have a blockage again due to your stool. PAIN Voltaren gel Apply to painful joints at least 4 times daily. This will help your pain, without the side effects of pain medicines in pill form. For best effect, apply regularly and daily. CONTINUE home meds: Continue other home meds as needed for pain. Please be careful with your meloxicam, as it can cause kidney damage. Please try to limit 1x meloxicam daily. WOUND CARE You will be going to a rehab facility with wound care, once discharged from there, please request a detailed instruction for wound care when you go home.  It was a pleasure meeting you, [name].   I wish you and your family the best, and hope you stay happy and healthy! 11/20, DO 12/25/2020

## 2020-12-25 NOTE — Care Management Important Message (Signed)
Important Message  Patient Details  Name: [name] MRN: [id number] Date of Birth: Aug 07, 1970   Medicare Important Message Given:  Yes     Renie Ora 12/25/2020, 10:41 AM

## 2020-12-25 NOTE — Progress Notes (Addendum)
Pt was discharged to SNF via [facility]. Called report to receiving nurse. Paperwork provided for facility and given to [facility].   Brooke Pace, RN

## 2020-12-25 NOTE — TOC Progression Note (Signed)
Transition of Care [facility]) - Progression Note    Patient Details  Name: [name] MRN: [id number] Date of Birth: 05/23/1970  Transition of Care [facility]) CM/SW [place], [place] Phone Number: 12/25/2020, 12:08 PM  Clinical Narrative:     CSW met with patient at bedside. CSW provided update- will discharge today to [facility], after family has completed admission paperwork.  CSW advised, once transportation is called- it may be couple hours before she actually leaves the hospital. Patient states understanding.  CSW called patient's mother,Judy, to informed of discharge today, confirm 1:00 pm appointment  with [facility] today and to inquire about how patient is transport to doctor office or any follow up appointments. She states the patient is unable to leave the home unless by ambulance. She states they are older and unable to physically move her.   CSW informed - received inquiry from [place] transportation to doctors office. Patient states at this time she is unable to sit up in chair. She adds, her parents are unable to mobilize her. She has reached out to [employer] but states,none of them were servicing this area. Her doctor appointments are completed by video chart. CSW asked about assistance in the home. She states she her friend, Roselyn Reef, is a CNA, comes 2x per week to help her bath and change her bed lien. " I make too much money to qualify for assistance. I get too much social security". Patient states she is unable to afford assistance in the home. CSW provided therapeutic listening and also encourage the patient since her parents is her full-time caregiver, and they getting older to explore additional care give options for the future. Patient was receptive and states understanding.   Thurmond Butts, MSW, LCSW Clinical Social Worker     Expected Discharge Plan: Skilled Nursing Facility Barriers to Discharge: Ship broker,  Continued Medical Work up, SNF Pending bed offer  Expected Discharge Plan and Services Expected Discharge Plan: [place] In-house Referral: Clinical Social Work                                             Social Determinants of Health (SDOH) Interventions    Readmission Risk Interventions No flowsheet data found.

## 2020-12-25 NOTE — Telephone Encounter (Signed)
RCID Patient Product/process development scientist completed.    The patient is insured through [employer].  [employer] Altes will need a PA.  We will continue to follow to see if copay assistance is needed.  Clearance Coots, CPhT Specialty Pharmacy Patient [facility] for Infectious Disease Phone: [phone] Fax:  [phone]

## 2020-12-25 NOTE — TOC Transition Note (Addendum)
Transition of Care [facility]) - CM/SW Discharge Note   Patient Details  Name: [name] MRN: [id number] Date of Birth: 11/08/1970  Transition of Care [facility]) CM/SW Contact:  Eduard Roux, LCSW Phone Number: 12/25/2020, 1:08 PM   Clinical Narrative:     Patient will Discharge to: [facility] Discharge Date: 12/25/2020 Family Notified: mother, Darel Hong Transport By: Sharin Mons  Per MD patient is ready for discharge. RN, patient, and facility notified of discharge. Discharge Summary sent to facility. RN given number for [phone], Room 3205. Ambulance transport requested for patient.   Clinical Social Worker signing off.  Antony Blackbird, MSW, LCSW Clinical Social Worker     Final next level of care: Skilled Nursing Facility Barriers to Discharge: Barriers Resolved   Patient Goals and CMS Choice        Discharge Placement              Patient chooses bed at: [facility] Patient to be transferred to facility by: [facility] Name of family member notified: mother, Darel Hong Patient and family notified of of transfer: 12/25/20  Discharge Plan and Services In-house Referral: Clinical Social Work                                   Social Determinants of Health (SDOH) Interventions     Readmission Risk Interventions No flowsheet data found.

## 2020-12-26 DIAGNOSIS — M869 Osteomyelitis, unspecified: Secondary | ICD-10-CM | POA: Diagnosis not present

## 2020-12-26 DIAGNOSIS — G629 Polyneuropathy, unspecified: Secondary | ICD-10-CM | POA: Diagnosis not present

## 2020-12-26 DIAGNOSIS — M25561 Pain in right knee: Secondary | ICD-10-CM | POA: Diagnosis not present

## 2020-12-26 DIAGNOSIS — G35 Multiple sclerosis: Secondary | ICD-10-CM | POA: Diagnosis not present

## 2020-12-26 DIAGNOSIS — K5900 Constipation, unspecified: Secondary | ICD-10-CM | POA: Diagnosis not present

## 2020-12-26 DIAGNOSIS — L8990 Pressure ulcer of unspecified site, unspecified stage: Secondary | ICD-10-CM | POA: Diagnosis not present

## 2020-12-26 DIAGNOSIS — K56609 Unspecified intestinal obstruction, unspecified as to partial versus complete obstruction: Secondary | ICD-10-CM | POA: Diagnosis not present

## 2020-12-27 DIAGNOSIS — G8929 Other chronic pain: Secondary | ICD-10-CM | POA: Diagnosis not present

## 2020-12-27 DIAGNOSIS — K5900 Constipation, unspecified: Secondary | ICD-10-CM | POA: Diagnosis not present

## 2020-12-27 DIAGNOSIS — G35 Multiple sclerosis: Secondary | ICD-10-CM | POA: Diagnosis not present

## 2020-12-27 DIAGNOSIS — M869 Osteomyelitis, unspecified: Secondary | ICD-10-CM | POA: Diagnosis not present

## 2020-12-28 ENCOUNTER — Inpatient Hospital Stay: Payer: Medicare PPO | Admitting: Internal Medicine

## 2020-12-28 DIAGNOSIS — L89314 Pressure ulcer of right buttock, stage 4: Secondary | ICD-10-CM | POA: Diagnosis not present

## 2021-01-04 DIAGNOSIS — L89314 Pressure ulcer of right buttock, stage 4: Secondary | ICD-10-CM | POA: Diagnosis not present

## 2021-01-08 ENCOUNTER — Encounter (HOSPITAL_COMMUNITY): Payer: Self-pay

## 2021-01-08 ENCOUNTER — Emergency Department (HOSPITAL_COMMUNITY): Payer: Medicare PPO

## 2021-01-08 ENCOUNTER — Inpatient Hospital Stay (HOSPITAL_COMMUNITY)
Admission: EM | Admit: 2021-01-08 | Discharge: 2021-01-10 | DRG: 689 | Disposition: A | Discharge status: Skilled Nursing Facility | Payer: Medicare PPO | Source: Skilled Nursing Facility | Attending: Family Medicine | Admitting: Family Medicine

## 2021-01-08 ENCOUNTER — Other Ambulatory Visit: Payer: Self-pay

## 2021-01-08 DIAGNOSIS — G8220 Paraplegia, unspecified: Secondary | ICD-10-CM | POA: Diagnosis present

## 2021-01-08 DIAGNOSIS — Z833 Family history of diabetes mellitus: Secondary | ICD-10-CM

## 2021-01-08 DIAGNOSIS — R4182 Altered mental status, unspecified: Secondary | ICD-10-CM | POA: Diagnosis present

## 2021-01-08 DIAGNOSIS — G9341 Metabolic encephalopathy: Secondary | ICD-10-CM | POA: Diagnosis present

## 2021-01-08 DIAGNOSIS — Z7401 Bed confinement status: Secondary | ICD-10-CM

## 2021-01-08 DIAGNOSIS — Z1619 Resistance to other specified beta lactam antibiotics: Secondary | ICD-10-CM | POA: Diagnosis present

## 2021-01-08 DIAGNOSIS — N390 Urinary tract infection, site not specified: Secondary | ICD-10-CM | POA: Diagnosis present

## 2021-01-08 DIAGNOSIS — Z79899 Other long term (current) drug therapy: Secondary | ICD-10-CM | POA: Diagnosis not present

## 2021-01-08 DIAGNOSIS — N3000 Acute cystitis without hematuria: Secondary | ICD-10-CM

## 2021-01-08 DIAGNOSIS — G35 Multiple sclerosis: Secondary | ICD-10-CM | POA: Diagnosis present

## 2021-01-08 DIAGNOSIS — I10 Essential (primary) hypertension: Secondary | ICD-10-CM | POA: Diagnosis not present

## 2021-01-08 DIAGNOSIS — R531 Weakness: Secondary | ICD-10-CM | POA: Diagnosis not present

## 2021-01-08 DIAGNOSIS — J9811 Atelectasis: Secondary | ICD-10-CM | POA: Diagnosis not present

## 2021-01-08 DIAGNOSIS — B965 Pseudomonas (aeruginosa) (mallei) (pseudomallei) as the cause of diseases classified elsewhere: Secondary | ICD-10-CM | POA: Diagnosis present

## 2021-01-08 DIAGNOSIS — Z87891 Personal history of nicotine dependence: Secondary | ICD-10-CM

## 2021-01-08 DIAGNOSIS — Z9109 Other allergy status, other than to drugs and biological substances: Secondary | ICD-10-CM | POA: Diagnosis not present

## 2021-01-08 DIAGNOSIS — M868X8 Other osteomyelitis, other site: Secondary | ICD-10-CM | POA: Diagnosis present

## 2021-01-08 DIAGNOSIS — R404 Transient alteration of awareness: Secondary | ICD-10-CM | POA: Diagnosis not present

## 2021-01-08 DIAGNOSIS — M1990 Unspecified osteoarthritis, unspecified site: Secondary | ICD-10-CM | POA: Diagnosis present

## 2021-01-08 DIAGNOSIS — R0902 Hypoxemia: Secondary | ICD-10-CM | POA: Diagnosis not present

## 2021-01-08 DIAGNOSIS — Z791 Long term (current) use of non-steroidal anti-inflammatories (NSAID): Secondary | ICD-10-CM

## 2021-01-08 DIAGNOSIS — K5909 Other constipation: Secondary | ICD-10-CM | POA: Diagnosis present

## 2021-01-08 DIAGNOSIS — R059 Cough, unspecified: Secondary | ICD-10-CM | POA: Diagnosis not present

## 2021-01-08 DIAGNOSIS — L89314 Pressure ulcer of right buttock, stage 4: Secondary | ICD-10-CM | POA: Diagnosis present

## 2021-01-08 DIAGNOSIS — M869 Osteomyelitis, unspecified: Secondary | ICD-10-CM | POA: Diagnosis not present

## 2021-01-08 DIAGNOSIS — U071 COVID-19: Secondary | ICD-10-CM | POA: Diagnosis present

## 2021-01-08 DIAGNOSIS — L8994 Pressure ulcer of unspecified site, stage 4: Secondary | ICD-10-CM | POA: Diagnosis present

## 2021-01-08 DIAGNOSIS — Z8249 Family history of ischemic heart disease and other diseases of the circulatory system: Secondary | ICD-10-CM | POA: Diagnosis not present

## 2021-01-08 DIAGNOSIS — R000 Tachycardia, unspecified: Secondary | ICD-10-CM | POA: Diagnosis not present

## 2021-01-08 DIAGNOSIS — L8990 Pressure ulcer of unspecified site, unspecified stage: Secondary | ICD-10-CM | POA: Diagnosis not present

## 2021-01-08 LAB — URINALYSIS, ROUTINE W REFLEX MICROSCOPIC
Bacteria, UA: NONE SEEN
Bilirubin Urine: NEGATIVE
Glucose, UA: NEGATIVE mg/dL
Ketones, ur: NEGATIVE mg/dL
Nitrite: POSITIVE — AB
Protein, ur: 100 mg/dL — AB
RBC / HPF: >50 RBC/hpf — ABNORMAL HIGH (ref 0–5)
Specific Gravity, Urine: 1.015 (ref 1.005–1.030)
WBC, UA: >50 WBC/hpf — ABNORMAL HIGH (ref 0–5)
pH: 6 (ref 5.0–8.0)

## 2021-01-08 MED ORDER — SODIUM CHLORIDE 0.9 % IV SOLN
2.0000 g | Freq: Once | INTRAVENOUS | Status: AC
  Administered 2021-01-08: 2 g via INTRAVENOUS
  Filled 2021-01-08: qty 20

## 2021-01-08 NOTE — ED Triage Notes (Signed)
Sacral bed sore stage 3, only verbalizes name, normally a&o x4

## 2021-01-08 NOTE — ED Notes (Signed)
Patients father called and would like a call back: Ree Kida [phone]

## 2021-01-08 NOTE — ED Provider Notes (Signed)
[facility]-EMERGENCY DEPT Provider Note   CSN: [id number] Arrival date & time: 01/08/21  2154     History Chief Complaint  Patient presents with   Altered Mental Status    Pt normally a&o x4 now is only alert to person, is able to open eyes and tell us her name, going on today    [name] is a 50 y.o. female.  The history is provided by the patient, medical records and the EMS personnel.  Altered Mental Status  50 year old female with history of arthritis, MS, recent hospitalization for osteomyelitis of the sacrum and right ischium currently on doxycycline and Augmentin, presenting to the ED with altered mental status.  Discharged from hospital 12/25/2020 to SNF for advanced wound care as she does have decubitus ulcer.  Facility reports over the weekend she was fine but today started to seem a little more drowsy and not as talkative.  They have not noticed any fever.  She did recently have COVID last week, was on paxlovid.  She continues to have persistent cough.  She is bedbound at baseline but usually able to converse and answer questions.  Past Medical History:  Diagnosis Date   Arthritis    MS (multiple sclerosis) (HCC)     Patient Active Problem List   Diagnosis Date Noted   Decubitus ulcer 12/13/2020   Decubitus ulcer, unstageable with infection (HCC) 12/12/2020   UTI (urinary tract infection) 12/12/2020   AKI (acute kidney injury) (HCC) 11/12/2020   Sacral osteomyelitis (HCC) 01/28/2019   Stage 4 decubitus ulcer (HCC) 01/25/2019   Cellulitis 01/25/2019   Decubitus skin ulcer 01/25/2019   Anxiety 05/13/2013   Essential hypertension, benign 04/25/2013   Encounter for long-term (current) use of other medications 04/25/2013   Constipation 04/09/2013   Displaced bimalleolar fracture of left ankle 03/30/2013   Multiple sclerosis (HCC) 08/25/2012   Chest pain 08/25/2012   Panic attack 08/25/2012    Past Surgical History:  Procedure  Laterality Date   NO PAST SURGERIES     ORIF ANKLE FRACTURE Left 03/30/2013   Procedure: OPEN REDUCTION INTERNAL FIXATION (ORIF) LEFT ANKLE FRACTURE;  Surgeon: Toni Arthurs, MD;  Location: [facility] OR;  Service: Orthopedics;  Laterality: Left;     OB History   No obstetric history on file.     History reviewed. No pertinent family history.  Social History   Tobacco Use   Smoking status: Former    Packs/day: 2.00    Years: 20.00    Pack years: 40.00    Types: Cigarettes    Quit date: 07/03/2008    Years since quitting: 12.5   Smokeless tobacco: Former    Quit date: 01/10/2019   Tobacco comments:    USES ELECTRONIC CIGARETTES/ quit 01/2019  Substance Use Topics   Alcohol use: No   Drug use: No    Home Medications Prior to Admission medications   Medication Sig Start Date End Date Taking? Authorizing Provider  acetaminophen (TYLENOL) 325 MG tablet Take 2 tablets (650 mg total) by mouth every 6 (six) hours as needed for mild pain (or Fever >/= 101). 11/13/20   Masters, Katie, DO  baclofen (LIORESAL) 20 MG tablet Take 20 mg by mouth 3 (three) times daily. 11/05/18   [provider]  collagenase (SANTYL) ointment Apply topically daily. Patient not taking: No sig reported 02/03/19   Lorin Glass, MD  diclofenac Sodium (VOLTAREN) 1 % GEL Apply 4 g topically 4 (four) times daily. Apply to joint or  muscle pains 12/25/20   Princess BruinsNguyen, Julie, DO  feeding supplement, ENSURE ENLIVE, (ENSURE ENLIVE) LIQD Take 237 mLs by mouth 2 (two) times daily between meals. Patient not taking: No sig reported 02/01/19   Lorin Glassahal, Binaya, MD  gabapentin (NEURONTIN) 300 MG capsule Take 300 mg by mouth See admin instructions. 300 mg  morning, noon and dinner 600 mg at bedtime. 12/14/18   [provider]  meloxicam (MOBIC) 7.5 MG tablet Take 7.5 mg by mouth daily. 11/05/18   [provider]  OVER THE COUNTER MEDICATION Take 1 Scoop by mouth daily as needed (UTI symptoms). Over the counter powder     [provider]  polyethylene glycol (MIRALAX / GLYCOLAX) 17 g packet Take 17 g by mouth 2 (two) times daily as needed for moderate constipation or mild constipation. 11/13/20   Masters, Katie, DO  senna-docusate (SENOKOT-S) 8.6-50 MG tablet Take 2 tablets by mouth 2 (two) times daily as needed for mild constipation. 11/13/20   Masters, Katie, DO  Zinc Oxide (TRIPLE PASTE) 12.8 % ointment Apply topically 2 (two) times daily. Apply AROUND sacral wound area. Then follow Dakin's order. 12/25/20   Princess BruinsNguyen, Julie, DO    Allergies    Silver, Tizanidine, and Tomato  Review of Systems   Review of Systems  Unable to perform ROS: Mental status change   Physical Exam Updated Vital Signs BP 124/76 (BP Location: Left Arm)   Pulse 94   Temp 98.1 F (36.7 C) (Oral)   Resp 18   Ht 5\' 6"  (1.676 m)   Wt 76 kg   SpO2 97%   BMI 27.04 kg/m   Physical Exam Vitals and nursing note reviewed.  Constitutional:      General: She is awake.     Appearance: She is well-developed.     Comments: Moans when turned and positioned during exam  HENT:     Head: Normocephalic and atraumatic.  Eyes:     Conjunctiva/sclera: Conjunctivae normal.     Pupils: Pupils are equal, round, and reactive to light.  Cardiovascular:     Rate and Rhythm: Normal rate and regular rhythm.     Heart sounds: Normal heart sounds.  Pulmonary:     Effort: Pulmonary effort is normal.     Breath sounds: Normal breath sounds.  Abdominal:     General: Bowel sounds are normal.     Palpations: Abdomen is soft.  Genitourinary:    Comments: Decubitus ulcer of right medial buttock, packing and dressing in place-- dressing pulled back, does appear to have some tissue maceration and skin is very friable, slightly bleeding when dressing removed Musculoskeletal:        General: Normal range of motion.     Cervical back: Normal range of motion.  Skin:    General: Skin is warm and dry.  Neurological:     Comments: Awake, responds to  name, no real movement of the extremities    ED Results / Procedures / Treatments   Labs (all labs ordered are listed, but only abnormal results are displayed) Labs Reviewed  CBC WITH DIFFERENTIAL/PLATELET - Abnormal; Notable for the following components:      Result Value   HCT 46.8 (*)    RDW 17.0 (*)    All other components within normal limits  URINALYSIS, ROUTINE W REFLEX MICROSCOPIC - Abnormal; Notable for the following components:   APPearance TURBID (*)    Hgb urine dipstick SMALL (*)    Protein, ur 100 (*)  Nitrite POSITIVE (*)    Leukocytes,Ua LARGE (*)    RBC / HPF >50 (*)    WBC, UA >50 (*)    All other components within normal limits  CULTURE, BLOOD (ROUTINE X 2)  CULTURE, BLOOD (ROUTINE X 2)  URINE CULTURE  LACTIC ACID, PLASMA  COMPREHENSIVE METABOLIC PANEL    EKG None  Radiology DG Chest Port 1 View  Result Date: 01/08/2021 CLINICAL DATA:  Post COVID. Altered mental status. Persistent cough. EXAM: PORTABLE CHEST 1 VIEW COMPARISON:  Radiograph 09/23/2012 FINDINGS: Mild patient rotation. Heart size is normal for technique. Minor bibasilar atelectasis. No confluent consolidation. No pulmonary edema. No pleural effusion or pneumothorax. Mild thoracic spondylosis. No acute osseous abnormalities are seen. IMPRESSION: Minor bibasilar atelectasis. No focal consolidation to suggest pneumonia. Electronically Signed   By: Keith Rake M.D.   On: 01/08/2021 22:50    Procedures Procedures   Medications Ordered in ED Medications  cefTRIAXone (ROCEPHIN) 2 g in sodium chloride 0.9 % 100 mL IVPB (0 g Intravenous Stopped 01/09/21 0000)    ED Course  I have reviewed the triage vital signs and the nursing notes.  Pertinent labs & imaging results that were available during my care of the patient were reviewed by me and considered in my medical decision making (see chart for details).    MDM Rules/Calculators/A&P                           50 y.o. F here with AMS.   Recent admission for osteomyelitis, currently on doxycycline and Augmentin.  Now at [facility] for advanced wound care for decubitus ulcer.  Also had covid last week, finished paxlovid.  She is awake and responds to name, moans when moved around.  VSS on RA.  Rectal temp 99.42F.  Concern for possible sepsis.  She is protecting airway and has noted cough.  Decubitus ulcer also fairly deep with macerated tissue but wound itself is fairly clean.  EKG, labs, cultures, CXR.  UA was already collected, grossly cloudy with large amount of sediment.     Labs as above-- normal lactate, normal WBC count.  UA nitrite +.  Cultures are pending.  Started 2g IV rocephin.  Wound care provided.  She will require admission.    Discussed with Dr. Nevada Crane-- will admit for ongoing care.  Final Clinical Impression(s) / ED Diagnoses Final diagnoses:  Altered mental status, unspecified altered mental status type  Acute cystitis without hematuria    Rx / DC Orders ED Discharge Orders     None        Larene Pickett, PA-C 01/09/21 0201    Isla Pence, MD 01/09/21 1615

## 2021-01-09 DIAGNOSIS — K5909 Other constipation: Secondary | ICD-10-CM | POA: Diagnosis present

## 2021-01-09 DIAGNOSIS — M1990 Unspecified osteoarthritis, unspecified site: Secondary | ICD-10-CM | POA: Diagnosis present

## 2021-01-09 DIAGNOSIS — N3000 Acute cystitis without hematuria: Secondary | ICD-10-CM | POA: Diagnosis present

## 2021-01-09 DIAGNOSIS — G9341 Metabolic encephalopathy: Secondary | ICD-10-CM | POA: Diagnosis present

## 2021-01-09 DIAGNOSIS — Z833 Family history of diabetes mellitus: Secondary | ICD-10-CM | POA: Diagnosis not present

## 2021-01-09 DIAGNOSIS — G8220 Paraplegia, unspecified: Secondary | ICD-10-CM | POA: Diagnosis present

## 2021-01-09 DIAGNOSIS — Z79899 Other long term (current) drug therapy: Secondary | ICD-10-CM | POA: Diagnosis not present

## 2021-01-09 DIAGNOSIS — Z1619 Resistance to other specified beta lactam antibiotics: Secondary | ICD-10-CM | POA: Diagnosis present

## 2021-01-09 DIAGNOSIS — R4182 Altered mental status, unspecified: Secondary | ICD-10-CM | POA: Diagnosis present

## 2021-01-09 DIAGNOSIS — Z8249 Family history of ischemic heart disease and other diseases of the circulatory system: Secondary | ICD-10-CM | POA: Diagnosis not present

## 2021-01-09 DIAGNOSIS — Z9109 Other allergy status, other than to drugs and biological substances: Secondary | ICD-10-CM | POA: Diagnosis not present

## 2021-01-09 DIAGNOSIS — L89314 Pressure ulcer of right buttock, stage 4: Secondary | ICD-10-CM | POA: Diagnosis present

## 2021-01-09 DIAGNOSIS — U071 COVID-19: Secondary | ICD-10-CM | POA: Diagnosis present

## 2021-01-09 DIAGNOSIS — G35 Multiple sclerosis: Secondary | ICD-10-CM | POA: Diagnosis present

## 2021-01-09 DIAGNOSIS — B965 Pseudomonas (aeruginosa) (mallei) (pseudomallei) as the cause of diseases classified elsewhere: Secondary | ICD-10-CM | POA: Diagnosis present

## 2021-01-09 DIAGNOSIS — Z791 Long term (current) use of non-steroidal anti-inflammatories (NSAID): Secondary | ICD-10-CM | POA: Diagnosis not present

## 2021-01-09 DIAGNOSIS — Z7401 Bed confinement status: Secondary | ICD-10-CM | POA: Diagnosis not present

## 2021-01-09 DIAGNOSIS — Z87891 Personal history of nicotine dependence: Secondary | ICD-10-CM | POA: Diagnosis not present

## 2021-01-09 DIAGNOSIS — M868X8 Other osteomyelitis, other site: Secondary | ICD-10-CM | POA: Diagnosis present

## 2021-01-09 LAB — CBC WITH DIFFERENTIAL/PLATELET
Abs Immature Granulocytes: 0.03 10*3/uL (ref 0.00–0.07)
Basophils Absolute: 0 10*3/uL (ref 0.0–0.1)
Basophils Relative: 0 %
Eosinophils Absolute: 0.2 10*3/uL (ref 0.0–0.5)
Eosinophils Relative: 2 %
HCT: 46.8 % — ABNORMAL HIGH (ref 36.0–46.0)
Hemoglobin: 14.6 g/dL (ref 12.0–15.0)
Immature Granulocytes: 0 %
Lymphocytes Relative: 28 %
Lymphs Abs: 2.8 10*3/uL (ref 0.7–4.0)
MCH: 29.7 pg (ref 26.0–34.0)
MCHC: 31.2 g/dL (ref 30.0–36.0)
MCV: 95.1 fL (ref 80.0–100.0)
Monocytes Absolute: 0.6 10*3/uL (ref 0.1–1.0)
Monocytes Relative: 6 %
Neutro Abs: 6.3 10*3/uL (ref 1.7–7.7)
Neutrophils Relative %: 64 %
Platelets: 304 10*3/uL (ref 150–400)
RBC: 4.92 MIL/uL (ref 3.87–5.11)
RDW: 17 % — ABNORMAL HIGH (ref 11.5–15.5)
WBC: 10 10*3/uL (ref 4.0–10.5)
nRBC: 0 % (ref 0.0–0.2)

## 2021-01-09 LAB — COMPREHENSIVE METABOLIC PANEL
ALT: 25 U/L (ref 0–44)
ALT: 25 U/L (ref 0–44)
AST: 49 U/L — ABNORMAL HIGH (ref 15–41)
AST: 44 U/L — ABNORMAL HIGH (ref 15–41)
Albumin: 3.5 g/dL (ref 3.5–5.0)
Albumin: 3.4 g/dL — ABNORMAL LOW (ref 3.5–5.0)
Alkaline Phosphatase: 110 U/L (ref 38–126)
Alkaline Phosphatase: 107 U/L (ref 38–126)
Anion gap: 9 (ref 5–15)
Anion gap: 11 (ref 5–15)
BUN: 24 mg/dL — ABNORMAL HIGH (ref 6–20)
BUN: 22 mg/dL — ABNORMAL HIGH (ref 6–20)
CO2: 27 mmol/L (ref 22–32)
CO2: 26 mmol/L (ref 22–32)
Calcium: 9.7 mg/dL (ref 8.9–10.3)
Calcium: 9.7 mg/dL (ref 8.9–10.3)
Chloride: 103 mmol/L (ref 98–111)
Chloride: 102 mmol/L (ref 98–111)
Creatinine, Ser: 0.47 mg/dL (ref 0.44–1.00)
Creatinine, Ser: 0.56 mg/dL (ref 0.44–1.00)
GFR, Estimated: >60 mL/min (ref >60)
GFR, Estimated: >60 mL/min (ref >60)
Glucose, Bld: 112 mg/dL — ABNORMAL HIGH (ref 70–99)
Glucose, Bld: 98 mg/dL (ref 70–99)
Potassium: 4.3 mmol/L (ref 3.5–5.1)
Potassium: 4.2 mmol/L (ref 3.5–5.1)
Sodium: 139 mmol/L (ref 135–145)
Sodium: 139 mmol/L (ref 135–145)
Total Bilirubin: 0.5 mg/dL (ref 0.3–1.2)
Total Bilirubin: 0.5 mg/dL (ref 0.3–1.2)
Total Protein: 8.9 g/dL — ABNORMAL HIGH (ref 6.5–8.1)
Total Protein: 8.6 g/dL — ABNORMAL HIGH (ref 6.5–8.1)

## 2021-01-09 LAB — CBC
HCT: 44.2 % (ref 36.0–46.0)
Hemoglobin: 14 g/dL (ref 12.0–15.0)
MCH: 29.7 pg (ref 26.0–34.0)
MCHC: 31.7 g/dL (ref 30.0–36.0)
MCV: 93.8 fL (ref 80.0–100.0)
Platelets: 315 10*3/uL (ref 150–400)
RBC: 4.71 MIL/uL (ref 3.87–5.11)
RDW: 17 % — ABNORMAL HIGH (ref 11.5–15.5)
WBC: 10.8 10*3/uL — ABNORMAL HIGH (ref 4.0–10.5)
nRBC: 0 % (ref 0.0–0.2)

## 2021-01-09 LAB — RESP PANEL BY RT-PCR (FLU A&B, COVID) ARPGX2
Influenza A by PCR: NEGATIVE
Influenza B by PCR: NEGATIVE
SARS Coronavirus 2 by RT PCR: POSITIVE — AB

## 2021-01-09 LAB — MAGNESIUM: Magnesium: 2.1 mg/dL (ref 1.7–2.4)

## 2021-01-09 LAB — LACTIC ACID, PLASMA: Lactic Acid, Venous: 1.5 mmol/L (ref 0.5–1.9)

## 2021-01-09 LAB — PHOSPHORUS: Phosphorus: 3.8 mg/dL (ref 2.5–4.6)

## 2021-01-09 MED ORDER — ACETAMINOPHEN 325 MG PO TABS
650.0000 mg | ORAL_TABLET | Freq: Four times a day (QID) | ORAL | Status: DC | PRN
  Administered 2021-01-09 – 2021-01-10 (×3): 650 mg via ORAL
  Filled 2021-01-09 (×3): qty 2

## 2021-01-09 MED ORDER — ZINC OXIDE 12.8 % EX OINT: TOPICAL_OINTMENT | CUTANEOUS | Status: DC | PRN

## 2021-01-09 MED ORDER — LACTATED RINGERS IV SOLN: INTRAVENOUS | Status: DC

## 2021-01-09 MED ORDER — MELOXICAM 7.5 MG PO TABS
7.5000 mg | ORAL_TABLET | Freq: Every day | ORAL | Status: DC
  Administered 2021-01-09 – 2021-01-10 (×2): 7.5 mg via ORAL
  Filled 2021-01-09 (×2): qty 1

## 2021-01-09 MED ORDER — SODIUM CHLORIDE 0.9 % IV SOLN
2.0000 g | Freq: Three times a day (TID) | INTRAVENOUS | Status: DC
  Administered 2021-01-09 – 2021-01-10 (×4): 2 g via INTRAVENOUS
  Filled 2021-01-09 (×5): qty 2

## 2021-01-09 MED ORDER — BACLOFEN 20 MG PO TABS
20.0000 mg | ORAL_TABLET | Freq: Three times a day (TID) | ORAL | Status: DC
  Administered 2021-01-09 – 2021-01-10 (×5): 20 mg via ORAL
  Filled 2021-01-09 (×2): qty 1
  Filled 2021-01-09: qty 2
  Filled 2021-01-09 (×2): qty 1

## 2021-01-09 MED ORDER — ENOXAPARIN SODIUM 40 MG/0.4ML IJ SOSY
40.0000 mg | PREFILLED_SYRINGE | INTRAMUSCULAR | Status: DC
  Administered 2021-01-09 – 2021-01-10 (×2): 40 mg via SUBCUTANEOUS
  Filled 2021-01-09 (×2): qty 0.4

## 2021-01-09 MED ORDER — ZINC OXIDE 12.8 % EX OINT
TOPICAL_OINTMENT | Freq: Two times a day (BID) | CUTANEOUS | Status: DC
  Filled 2021-01-09: qty 56.7

## 2021-01-09 MED ORDER — GABAPENTIN 300 MG PO CAPS
300.0000 mg | ORAL_CAPSULE | Freq: Once | ORAL | Status: AC
  Administered 2021-01-09: 300 mg via ORAL
  Filled 2021-01-09: qty 1

## 2021-01-09 MED ORDER — SODIUM CHLORIDE 0.9 % IV SOLN: 2.0000 g | INTRAVENOUS | Status: DC

## 2021-01-09 MED ORDER — SENNOSIDES-DOCUSATE SODIUM 8.6-50 MG PO TABS: 2.0000 | ORAL_TABLET | Freq: Two times a day (BID) | ORAL | Status: DC | PRN

## 2021-01-09 MED ORDER — POLYETHYLENE GLYCOL 3350 17 G PO PACK: 17.0000 g | PACK | Freq: Two times a day (BID) | ORAL | Status: DC | PRN

## 2021-01-09 NOTE — Evaluation (Signed)
Clinical/Bedside Swallow Evaluation Patient Details  Name: [name] MRN: [id number] Date of Birth: [birth year]  Today's Date: 01/09/2021 Time: SLP Start Time (ACUTE ONLY): 1620 SLP Stop Time (ACUTE ONLY): 1645 SLP Time Calculation (min) (ACUTE ONLY): 25 min  Past Medical History:  Past Medical History:  Diagnosis Date   Arthritis    MS (multiple sclerosis) (HCC)    Past Surgical History:  Past Surgical History:  Procedure Laterality Date   NO PAST SURGERIES     ORIF ANKLE FRACTURE Left 03/30/2013   Procedure: OPEN REDUCTION INTERNAL FIXATION (ORIF) LEFT ANKLE FRACTURE;  Surgeon: Toni Arthurs, MD;  Location: [facility] OR;  Service: Orthopedics;  Laterality: Left;   HPI:  Pt is a 50 yo female with h/o MS, spastic paraplegic, bedbound since 2015.  Pt was recently discharged from the hospital on 12/25/20 to SNF for advance wound care of her right ischial decubitus ulcer.  She was doing fine until day of admit when per MD notes, she was found to be less interactive, less talkative, and more drowsy.  Tested positive for COVID-19 at SNF a week ago and was started on Paxlovid.  CXR showed Minor bibasilar atelectasis. No focal consolidation to suggest  pneumonia.  Pt reported she takes her medications crushed = if she does not, they get stuck in her throat. Swallow evaluation order received.    Assessment / Plan / Recommendation  Clinical Impression  Pt presents with normal oropharyngeal swallow ability based on clinical swallow evaluation.  She easily passed 3 ounce Yale water screen, tolerated po intake without indication of oropharyngeal swallow ability nor oral retention (crackers, water). Pt endorses dysphagia to pills - stating they stick in her throat *pointing to distal pharynx-midline* if they are not crushed and given with applesauce.    Evaluation was abbreviated due to staff arriving to transport pt to the floor.  Given pt is edentulous, advised she order softer foods but recommend continue  regular consistently to allow her to choose what she can manage.  Educated pt to recommendations using teach back and providing written handout with strategies. Will follow up in attempt for meal observation to assure pt tolerating solids well given her pill dysphagia. SLP Visit Diagnosis: Dysphagia, unspecified (R13.10)    Aspiration Risk  Mild aspiration risk    Diet Recommendation Regular;Thin liquid (to allow pt to order foods she can manage)   Liquid Administration via: Straw Medication Administration: Crushed with puree Supervision: Patient able to self feed Compensations: Slow rate;Small sips/bites Postural Changes: Seated upright at 90 degrees;Remain upright for at least 30 minutes after po intake    Other  Recommendations Oral Care Recommendations: Oral care BID    Recommendations for follow up therapy are one component of a multi-disciplinary discharge planning process, led by the attending physician.  Recommendations may be updated based on patient status, additional functional criteria and insurance authorization.  Follow up Recommendations   TBD     Frequency and Duration min 1 x/week  1 week       Prognosis Prognosis for Safe Diet Advancement: Good Barriers to Reach Goals: Time post onset      Swallow Study   General Date of Onset: 01/09/21 HPI: Pt is a 50 yo female with h/o MS, spastic paraplegic, bedbound since 2015.  Pt was recently discharged from the hospital on 12/25/20 to SNF for advance wound care of her right ischial decubitus ulcer.  She was doing fine until day of admit when per MD notes, she was  found to be less interactive, less talkative, and more drowsy.  Tested positive for COVID-19 at SNF a week ago and was started on Paxlovid.  CXR showed Minor bibasilar atelectasis. No focal consolidation to suggest  pneumonia.  Pt reported she takes her medications crushed = if she does not, they get stuck in her throat. Swallow evaluation order received. Type of  Study: Bedside Swallow Evaluation Previous Swallow Assessment: none in EPIC Diet Prior to this Study: Regular;Thin liquids Temperature Spikes Noted: No Respiratory Status: Room air History of Recent Intubation: No Behavior/Cognition: Alert;Cooperative Oral Cavity Assessment: Within Functional Limits Oral Care Completed by SLP: No Oral Cavity - Dentition: Edentulous Vision: Functional for self-feeding Self-Feeding Abilities: Able to feed self;Needs set up Patient Positioning: Upright in bed Baseline Vocal Quality: Normal Volitional Cough: Strong Volitional Swallow: Able to elicit    Oral/Motor/Sensory Function Overall Oral Motor/Sensory Function: Within functional limits   Ice Chips Ice chips: Not tested   Thin Liquid Thin Liquid: Within functional limits Presentation: Self Fed;Straw Other Comments: 3 ounce Yale water test administered and easily passed    Nectar Thick Nectar Thick Liquid: Not tested   Honey Thick Honey Thick Liquid: Not tested   Puree Puree: Not tested Other Comments: pt declined to consume applesauce   Solid     Solid: Within functional limits Presentation: Self Fed Other Comments: banana, graham cracker      Chales Abrahams 01/09/2021,7:16 PM  Rolena Infante, MS [facility] SLP Acute Rehab Services Office [phone] Pager [phone]

## 2021-01-09 NOTE — Progress Notes (Signed)
   Patient seen and examined at bedside, patient admitted after midnight, please see earlier detailed admission note by Darlin Drop, DO. Briefly, patient presented secondary to altered mental status.  Subjective: No concerns today. Feels well.  BP 126/74   Pulse (!) 106   Temp 98.2 F (36.8 C)   Resp 18   Ht 5\' 6"  (1.676 m)   Wt 76 kg   SpO2 97%   BMI 27.04 kg/m   General exam: Appears calm and comfortable Respiratory system: Clear to auscultation. Respiratory effort normal. Cardiovascular system: S1 & S2 heard, RRR. No murmurs, rubs, gallops or clicks. Gastrointestinal system: Abdomen is nondistended, soft and nontender. No organomegaly or masses felt. Normal bowel sounds heard. Central nervous system: Alert and oriented. Musculoskeletal: No edema. No calf tenderness Skin: No cyanosis. No rashes Psychiatry: Judgement and insight appear normal. Mood & affect appropriate.   Brief assessment/Plan:  Acute metabolic encephalopathy Possibly secondary to UTI. Appears to be resolved.  UTI Urine culture pending. Ceftriaxone IV started empirically. -Follow-up urine culture -Continue Ceftriaxone IV  Right ischial decubitus ulcer Osteomyelitis Previously diagnosed; present on admission. Completed antibiotics for osteomyelitis.  Spastic paraplegia -Continue baclofen  COVID-19 infection Previously diagnosed. Patient was started on Paxlovid as an outpatient. -Obtain outpatient records   Family communication: Father and mother on telephone DVT prophylaxis: Lovenox Disposition: Discharge back to SNF likely in 24 hours. TOC consulted.  , MD [facility] 01/09/2021, 12:01 PM

## 2021-01-09 NOTE — Progress Notes (Signed)
BSE completed, full report to follow. Pt endorses pill dysphagia requiring crushed medications with puree.  No focal CN deficits and she was able to pass 3 ounce YAle water screen.  Pt is edentulous and eats softer foods, recommend regular/thin diet to allow pt to choose food items she can manage.  Informed RN on the floor and left swallow precaution signs with RN/NT.    Rolena Infante, MS [facility] SLP Acute Rehab Services Office [phone] Pager [phone]

## 2021-01-09 NOTE — ED Notes (Signed)
Pt states she gets her meds crushed w/ applesauce. SLP order placed, regular diet was placed initially, will see SLP recommendations.

## 2021-01-09 NOTE — Consult Note (Signed)
WOC Nurse wound consult note Consultation was completed by review of records, images and assistance from the bedside nurse/clinical staff.   Reason for Consult:pressure injury Wound type: Stage 4 pressure injury right ischium; history of osteomyelitis  Pressure Injury POA: Yes Further assessment pending transfer to the floor and images from MD Dressing procedure/placement/frequency:  Saline moist gauze packing, top with dry dressing Order patient air mattress while in ED for moisture management and pressure redistribution Manage incontinence Consult RD for supplementation for wound healing  Zinc based barrier ointment for protection from incontinence  Adeel Guiffre [facility], CNS, CWON-AP [phone]

## 2021-01-09 NOTE — Progress Notes (Signed)
Pharmacy Antibiotic Note  [name] is a 50 y.o. female admitted on 01/08/2021 with sepsis and UTI.  Pharmacy has been consulted for Cefepime dosing.  Rocephin started in ED, however now broadening empiric coverage given Urine cx from early Oct + for pseudomonas & citrobacter sensitive to Cefepime.  Good renal fxn; CrCl >36ml/min.   Plan: Cefepime 2gm IV q8h No dose adjustments anticipated.  Pharmacy will sign off and monitor peripherally via electronic surveillance software for any changes in renal function or micro data.   Height: 5\' 6"  (167.6 cm) Weight: 76 kg (167 lb 8.8 oz) IBW/kg (Calculated) : 59.3  Temp (24hrs), Avg:98.1 F (36.7 C), Min:98.1 F (36.7 C), Max:98.1 F (36.7 C)  Recent Labs  Lab 01/08/21 2228  WBC 10.0  CREATININE 0.47  LATICACIDVEN 1.5    Estimated Creatinine Clearance: 85.7 mL/min (by C-G formula based on SCr of 0.47 mg/dL).    Allergies  Allergen Reactions   Silver    Tizanidine Diarrhea   Tomato     Antimicrobials this admission: 10/31 Rocephin x1 11/1 Cefepime >>   Dose adjustments this admission:  Microbiology results: 10/31 BCx:  10/31 UCx:    Thank you for allowing pharmacy to be a part of this patient's care.  11/31 PharmD 01/09/2021 3:09 AM

## 2021-01-09 NOTE — H&P (Addendum)
History and Physical  [name] [id number] DOB: Sep 29, 1970 DOA: 01/08/2021  Referring physician: Baird Cancer PA-EDP  PCP: Alvester Chou, NP  Outpatient Specialists: Infectious disease. Patient coming from: SNF.  Chief Complaint: Altered mental status  HPI: [name] is a 50 y.o. female with medical history significant for multiple sclerosis, bedbound since 2015, essential hypertension, chronic constipation, pressure wounds, right ischial osteomyelitis, spastic paraplegia, who presented to [facility] ED from SNF due to altered mental status x 1 day.  More withdrawn and drowsy for the past 24 hours.  At baseline she is alert, oriented x4 and talkative.  Recently discharged from the hospital on 12/25/20 to SNF for advance wound care of her right ischial decubitus ulcer.  Admitted by IM teaching service.  She was doing fine until today when she was found to be less interactive, less talkative, and more drowsy.  Tested positive for COVID-19 at SNF a week ago and was started on Paxlovid.  She was brought to the ED for further evaluation and management.  Upon presentation to the ED, work-up is concerning for UTI with UA positive for pyuria.  Urine culture and blood cultures obtained, started on Rocephin empirically in the ED.  TRH, hospitalist team, was asked to admit.  ED Course: Temperature 98.2.  BP 114/72, pulse 101, respiration rate 14, O2 saturation 98% on room air.  Lab studies remarkable for WBC 10.0.  AST 49.  UA positive for pyuria.  Chest x-ray minor bibasilar atelectasis.  Review of Systems: Review of systems as noted in the HPI. All other systems reviewed and are negative.   Past Medical History:  Diagnosis Date   Arthritis    MS (multiple sclerosis) ([place])    Past Surgical History:  Procedure Laterality Date   NO PAST SURGERIES     ORIF ANKLE FRACTURE Left 03/30/2013   Procedure: OPEN REDUCTION INTERNAL FIXATION (ORIF) LEFT ANKLE FRACTURE;  Surgeon: Wylene Simmer, MD;  Location: [place];  Service: Orthopedics;  Laterality: Left;    Social History:  reports that she quit smoking about 12 years ago. She has a 40.00 pack-year smoking history. She quit smokeless tobacco use about 2 years ago. She reports that she does not drink alcohol and does not use drugs.   Allergies  Allergen Reactions   Silver    Tizanidine Diarrhea   Tomato     Family history: Father with history of heart disease and diabetes. Mother with history of hypertension Sister history of hypertension.  Prior to Admission medications   Medication Sig Start Date End Date Taking? Authorizing Provider  acetaminophen (TYLENOL) 325 MG tablet Take 2 tablets (650 mg total) by mouth every 6 (six) hours as needed for mild pain (or Fever >/= 101). 11/13/20   Masters, Katie, DO  baclofen (LIORESAL) 20 MG tablet Take 20 mg by mouth 3 (three) times daily. 11/05/18   [provider]  collagenase (SANTYL) ointment Apply topically daily. Patient not taking: No sig reported 02/03/19   Terrilee Croak, MD  diclofenac Sodium (VOLTAREN) 1 % GEL Apply 4 g topically 4 (four) times daily. Apply to joint or muscle pains 12/25/20   Merrily Brittle, DO  feeding supplement, ENSURE ENLIVE, (ENSURE ENLIVE) LIQD Take 237 mLs by mouth 2 (two) times daily between meals. Patient not taking: No sig reported 02/01/19   Terrilee Croak, MD  gabapentin (NEURONTIN) 300 MG capsule Take 300 mg by mouth See admin instructions. 300 mg  morning, noon and dinner 600 mg at bedtime. 12/14/18  [provider]  meloxicam (MOBIC) 7.5 MG tablet Take 7.5 mg by mouth daily. 11/05/18   [provider]  OVER THE COUNTER MEDICATION Take 1 Scoop by mouth daily as needed (UTI symptoms). Over the counter powder    [provider]  polyethylene glycol (MIRALAX / GLYCOLAX) 17 g packet Take 17 g by mouth 2 (two) times daily as needed for moderate constipation or mild constipation. 11/13/20   Masters, Katie, DO  senna-docusate (SENOKOT-S)  8.6-50 MG tablet Take 2 tablets by mouth 2 (two) times daily as needed for mild constipation. 11/13/20   Masters, Katie, DO  Zinc Oxide (TRIPLE PASTE) 12.8 % ointment Apply topically 2 (two) times daily. Apply AROUND sacral wound area. Then follow Dakin's order. 12/25/20   Merrily Brittle, DO    Physical Exam: BP 115/70   Pulse 97   Temp 98.1 F (36.7 C) (Oral)   Resp 14   Ht 5\' 6"  (1.676 m)   Wt 76 kg   SpO2 97%   BMI 27.04 kg/m   General: 50 y.o. year-old female well developed well nourished in no acute distress.  Somnolent but arousable to voices. Cardiovascular: Regular rate and rhythm with no rubs or gallops.  No thyromegaly or JVD noted.  No lower extremity edema. 2/4 pulses in all 4 extremities. Respiratory: Clear to auscultation with no wheezes or rales. Good inspiratory effort. Abdomen: Soft nontender nondistended with normal bowel sounds x4 quadrants. Muskuloskeletal: No cyanosis or clubbing.  Trace edema noted bilaterally Neuro: CN II-XII intact, strength, sensation, reflexes Skin: Pressure wounds, POA Psychiatry: Judgement and insight appear altered.  Unable to assess mood due to somnolence.         Labs on Admission:  Basic Metabolic Panel: Recent Labs  Lab 01/08/21 2228  NA 139  K 4.3  CL 103  CO2 27  GLUCOSE 112*  BUN 24*  CREATININE 0.47  CALCIUM 9.7   Liver Function Tests: Recent Labs  Lab 01/08/21 2228  AST 49*  ALT 25  ALKPHOS 110  BILITOT 0.5  PROT 8.9*  ALBUMIN 3.5   No results for input(s): LIPASE, AMYLASE in the last 168 hours. No results for input(s): AMMONIA in the last 168 hours. CBC: Recent Labs  Lab 01/08/21 2228  WBC 10.0  NEUTROABS 6.3  HGB 14.6  HCT 46.8*  MCV 95.1  PLT 304   Cardiac Enzymes: No results for input(s): CKTOTAL, CKMB, CKMBINDEX, TROPONINI in the last 168 hours.  BNP (last 3 results) No results for input(s): BNP in the last 8760 hours.  ProBNP (last 3 results) No results for input(s): PROBNP in the last  8760 hours.  CBG: No results for input(s): GLUCAP in the last 168 hours.  Radiological Exams on Admission: DG Chest Port 1 View  Result Date: 01/08/2021 CLINICAL DATA:  Post COVID. Altered mental status. Persistent cough. EXAM: PORTABLE CHEST 1 VIEW COMPARISON:  Radiograph 09/23/2012 FINDINGS: Mild patient rotation. Heart size is normal for technique. Minor bibasilar atelectasis. No confluent consolidation. No pulmonary edema. No pleural effusion or pneumothorax. Mild thoracic spondylosis. No acute osseous abnormalities are seen. IMPRESSION: Minor bibasilar atelectasis. No focal consolidation to suggest pneumonia. Electronically Signed   By: Keith Rake M.D.   On: 01/08/2021 22:50    EKG: I independently viewed the EKG done and my findings are as followed:    Assessment/Plan Present on Admission:  AMS (altered mental status)  Active Problems:   AMS (altered mental status)  Acute metabolic encephalopathy suspect multifactorial secondary to  presumptive UTI, COVID-19 viral infection. Treat underlying conditions Reorient as needed Fall/aspiration precautions.  Presumptive UTI, POA UA positive for pyuria Follow cultures for ID and sensitivities. Received Rocephin in the ED.  Switched to cefepime based upon previous urine culture results which showed resistance to Rocephin. Continue gentle IV fluid hydration   Right ischial decubitus ulcer complicated by osteomyelitis  Resume home regimen  Wound care specialist consulted to assist with the management. Continue local wound care Continue IV antibiotics  Spastic paraplegia Resume home regimen  Recent COVID-19 viral infection Diagnosed a week ago at SNF, received paxlovid No evidence of pneumonia on chest x-ray, not hypoxic      DVT prophylaxis: Subcu Lovenox daily  Code Status: Full code  Family Communication: None at bedside  Disposition Plan: Admitted to telemetry unit  Consults called: Wound care  specialist  Admission status: Inpatient status.  Patient will require at least 2 midnights for further evaluation and treatment of present condition.   Status is: Inpatient       Darlin Drop MD [facility] Pager [phone]  If 7PM-7AM, please contact night-coverage [email or web address] Password [email or web address]  01/09/2021, 2:18 AM

## 2021-01-10 DIAGNOSIS — G9341 Metabolic encephalopathy: Secondary | ICD-10-CM

## 2021-01-10 LAB — BLOOD CULTURE ID PANEL (REFLEXED) - BCID2

## 2021-01-10 LAB — URINE CULTURE: Culture: >=100000 — AB

## 2021-01-10 LAB — MRSA NEXT GEN BY PCR, NASAL: MRSA by PCR Next Gen: NOT DETECTED

## 2021-01-10 MED ORDER — CIPROFLOXACIN HCL 500 MG PO TABS
250.0000 mg | ORAL_TABLET | Freq: Two times a day (BID) | ORAL | Status: DC
  Administered 2021-01-10: 250 mg via ORAL
  Filled 2021-01-10: qty 1

## 2021-01-10 MED ORDER — CIPROFLOXACIN HCL 250 MG PO TABS: 250.0000 mg | ORAL_TABLET | Freq: Two times a day (BID) | ORAL | Status: AC

## 2021-01-10 MED ORDER — GUAIFENESIN-DM 100-10 MG/5ML PO SYRP
5.0000 mL | ORAL_SOLUTION | ORAL | Status: DC | PRN
Start: 2021-01-10 — End: 2021-01-10
  Administered 2021-01-10 (×2): 5 mL via ORAL
  Filled 2021-01-10 (×2): qty 5

## 2021-01-10 NOTE — Discharge Summary (Signed)
Physician Discharge Summary  [name] [id number] DOB: 10-21-1970 DOA: 01/08/2021  PCP: Alvester Chou, NP  Admit date: 01/08/2021 Discharge date: 01/10/2021  Admitted From: SNF Disposition: SNF  Recommendations for Outpatient Follow-up:  Follow up with PCP in 1 week Please follow up on the following pending results: None   Discharge Condition: Stable CODE STATUS: DNI Diet recommendation: Regular   Brief/Interim Summary:  Admission HPI written by Kayleen Memos, DO   HPI: [name] is a 50 y.o. female with medical history significant for multiple sclerosis, bedbound since 2015, essential hypertension, chronic constipation, pressure wounds, right ischial osteomyelitis, spastic paraplegia, who presented to [facility] ED from SNF due to altered mental status x 1 day.  More withdrawn and drowsy for the past 24 hours.  At baseline she is alert, oriented x4 and talkative.  Recently discharged from the hospital on 12/25/20 to SNF for advance wound care of her right ischial decubitus ulcer.  Admitted by IM teaching service.  She was doing fine until today when she was found to be less interactive, less talkative, and more drowsy.  Tested positive for COVID-19 at SNF a week ago and was started on Paxlovid.  She was brought to the ED for further evaluation and management.  Upon presentation to the ED, work-up is concerning for UTI with UA positive for pyuria.  Urine culture and blood cultures obtained, started on Rocephin empirically in the ED.  TRH, hospitalist team, was asked to admit.  Hospital course:  Acute metabolic encephalopathy Possibly secondary to UTI. Appears to be resolved.   UTI Urine culture pending. Ceftriaxone IV started empirically and transitioned to Cefepime IV. Urine culture significant for Pseudomonas aeruginosa. Transition to ciprofloxacin to complete 5 days of antibiotics   Right ischial decubitus ulcer Osteomyelitis Previously diagnosed; present on  admission. Completed antibiotics for osteomyelitis.   Spastic paraplegia Continue baclofen   COVID-19 infection Previously diagnosed. Patient was started on Paxlovid as an outpatient. Outpatient records requested.   Discharge Diagnoses:  Principal Problem:   Acute metabolic encephalopathy Active Problems:   Stage 4 decubitus ulcer (HCC)   UTI (urinary tract infection)    Discharge Instructions   Allergies as of 01/10/2021       Reactions   Silver    Tizanidine Diarrhea   Tomato         Medication List     TAKE these medications    acetaminophen 325 MG tablet Commonly known as: TYLENOL Take 2 tablets (650 mg total) by mouth every 6 (six) hours as needed for mild pain (or Fever >/= 101).   baclofen 20 MG tablet Commonly known as: LIORESAL Take 20 mg by mouth 3 (three) times daily.   ciprofloxacin 250 MG tablet Commonly known as: CIPRO Take 1 tablet (250 mg total) by mouth 2 (two) times daily for 4 days.   diclofenac Sodium 1 % Gel Commonly known as: VOLTAREN Apply 4 g topically 4 (four) times daily. Apply to joint or muscle pains   feeding supplement Liqd Take 237 mLs by mouth 2 (two) times daily between meals.   gabapentin 300 MG capsule Commonly known as: NEURONTIN Take 300-600 mg by mouth See admin instructions. 300 mg  morning, noon and dinner and 2 capsules (600mg ) at bedtime.   meloxicam 7.5 MG tablet Commonly known as: MOBIC Take 7.5 mg by mouth daily.   multivitamin capsule Take 1 capsule by mouth daily.   polyethylene glycol 17 g packet Commonly known as: MIRALAX /  GLYCOLAX Take 17 g by mouth 2 (two) times daily as needed for moderate constipation or mild constipation.   senna-docusate 8.6-50 MG tablet Commonly known as: Senokot-S Take 2 tablets by mouth 2 (two) times daily as needed for mild constipation.   vitamin C 500 MG tablet Commonly known as: ASCORBIC ACID Take 500 mg by mouth daily.   Zinc Oxide 12.8 % ointment Commonly  known as: TRIPLE PASTE Apply topically 2 (two) times daily. Apply AROUND sacral wound area. Then follow Dakin's order.        Allergies  Allergen Reactions   Silver    Tizanidine Diarrhea   Tomato     Consultations: None   Procedures/Studies: DG Abd 1 View  Result Date: 12/22/2020 CLINICAL DATA:  History of constipation EXAM: ABDOMEN - 1 VIEW COMPARISON:  12/20/2020 FINDINGS: Scattered large and small bowel gas is noted. The degree of retained fecal material has improved significantly although persistent fecal material in the left colon is noted. No free air is seen. No obstructive changes are noted. IMPRESSION: Improvement in the degree of colonic constipation when compared with the prior exam. Electronically Signed   By: Inez Catalina M.D.   On: 12/22/2020 23:10   MR PELVIS W WO CONTRAST  Result Date: 12/12/2020 CLINICAL DATA:  Sacral decubitus ulcer. EXAM: MRI PELVIS WITHOUT AND WITH CONTRAST TECHNIQUE: Multiplanar multisequence MR imaging of the pelvis was performed both before and after administration of intravenous contrast. CONTRAST:  8.48mL GADAVIST GADOBUTROL 1 MMOL/ML IV SOLN COMPARISON:  CT abdomen pelvis dated November 12, 2020. MRI sacrum dated January 27, 2019. FINDINGS: Bones/Joint/Cartilage Abnormal marrow edema and enhancement with corresponding decreased T1 marrow signal involving the right ischial tuberosity no fracture or dislocation. Chronically eroded distal sacrum and coccyx, unchanged. No hip joint effusion. Muscles and Tendons Diffuse pelvic muscle atrophy. Soft tissue Deep right ischial decubitus ulcer extending to bone. No fluid collection. Reactive right inguinal lymph nodes. IMPRESSION: 1. Deep right ischial decubitus ulcer extending to bone with underlying osteomyelitis of the right ischial tuberosity. No abscess. Electronically Signed   By: Titus Dubin M.D.   On: 12/12/2020 20:04   DG Chest Port 1 View  Result Date: 01/08/2021 CLINICAL DATA:  Post  COVID. Altered mental status. Persistent cough. EXAM: PORTABLE CHEST 1 VIEW COMPARISON:  Radiograph 09/23/2012 FINDINGS: Mild patient rotation. Heart size is normal for technique. Minor bibasilar atelectasis. No confluent consolidation. No pulmonary edema. No pleural effusion or pneumothorax. Mild thoracic spondylosis. No acute osseous abnormalities are seen. IMPRESSION: Minor bibasilar atelectasis. No focal consolidation to suggest pneumonia. Electronically Signed   By: Keith Rake M.D.   On: 01/08/2021 22:50   DG Abd Portable 1V  Result Date: 12/20/2020 CLINICAL DATA:  Vomiting and diarrhea. EXAM: PORTABLE ABDOMEN - 1 VIEW COMPARISON:  None. FINDINGS: Multiple dilated small bowel loops are seen within the mid and lower abdomen. A large stool burden is noted. There is no evidence of free air. No radio-opaque calculi or other significant radiographic abnormality are seen. IMPRESSION: Large stool burden with additional findings consistent with small-bowel obstruction versus ileus. Electronically Signed   By: Virgina Norfolk M.D.   On: 12/20/2020 15:59      Subjective: No issues this morning  Discharge Exam: Vitals:   01/10/21 0430 01/10/21 0758  BP: 138/74 (!) 142/77  Pulse: 88 94  Resp: 20 14  Temp: 98.6 F (37 C) 99.1 F (37.3 C)  SpO2: 100% 98%   Vitals:   01/09/21 2016 01/10/21 0037 01/10/21 0430  01/10/21 0758  BP: 129/62 (!) 149/81 138/74 (!) 142/77  Pulse: (!) 108 99 88 94  Resp: 20 16 20 14   Temp: 99.4 F (37.4 C) 98.1 F (36.7 C) 98.6 F (37 C) 99.1 F (37.3 C)  TempSrc: Oral Oral Oral Oral  SpO2: 98% 100% 100% 98%  Weight:      Height:        General: Pt is alert, awake, not in acute distress Cardiovascular: RRR, S1/S2 +, no rubs, no gallops Respiratory: CTA bilaterally, no wheezing, no rhonchi Abdominal: Soft, NT, ND, bowel sounds + Extremities: no edema, no cyanosis    The results of significant diagnostics from this hospitalization (including imaging,  microbiology, ancillary and laboratory) are listed below for reference.     Microbiology: Recent Results (from the past 240 hour(s))  Blood culture (routine x 2)     Status: Abnormal (Preliminary result)   Collection Time: 01/08/21 10:28 PM   Specimen: BLOOD  Result Value Ref Range Status   Specimen Description   Final    BLOOD LEFT ANTECUBITAL Performed at [facility] [address], [place], [place] [address]    Special Requests   Final    BOTTLES DRAWN AEROBIC AND ANAEROBIC Blood Culture results may not be optimal due to an excessive volume of blood received in culture bottles Performed at [facility] [address] [place], [place] [address]    Culture  Setup Time   Final    GRAM POSITIVE COCCI IN CLUSTERS ANAEROBIC BOTTLE ONLY CRITICAL RESULT CALLED TO, READ BACK BY AND VERIFIED WITH: PHARMD MICHELLE LILLISTON 01/10/21 @0100  BY JW    Culture (A)  Final    STAPHYLOCOCCUS EPIDERMIDIS THE SIGNIFICANCE OF ISOLATING THIS ORGANISM FROM A SINGLE SET OF BLOOD CULTURES WHEN MULTIPLE SETS ARE DRAWN IS UNCERTAIN. PLEASE NOTIFY THE MICROBIOLOGY DEPARTMENT WITHIN ONE WEEK IF SPECIATION AND SENSITIVITIES ARE REQUIRED. Performed at [facility], [place] [address], [place], [place] [address]    Report Status PENDING  Incomplete  Urine Culture     Status: Abnormal   Collection Time: 01/08/21 10:28 PM   Specimen: In/Out Cath Urine  Result Value Ref Range Status   Specimen Description   Final    IN/OUT CATH URINE Performed at [facility] [address] [place], [place] [address]    Special Requests   Final    NONE Performed at [facility], [place] [address] [place], [place] [address]    Culture >=100,000 COLONIES/mL PSEUDOMONAS AERUGINOSA (A)  Final   Report Status 01/10/2021 FINAL  Final   Organism ID, Bacteria PSEUDOMONAS AERUGINOSA (A)  Final      Susceptibility   Pseudomonas aeruginosa - MIC*     CEFTAZIDIME 4 SENSITIVE Sensitive     CIPROFLOXACIN <=0.25 SENSITIVE Sensitive     GENTAMICIN <=1 SENSITIVE Sensitive     IMIPENEM 2 SENSITIVE Sensitive     PIP/TAZO 8 SENSITIVE Sensitive     CEFEPIME 2 SENSITIVE Sensitive     * >=100,000 COLONIES/mL PSEUDOMONAS AERUGINOSA  Blood Culture ID Panel (Reflexed)     Status: Abnormal   Collection Time: 01/08/21 10:28 PM  Result Value Ref Range Status   Enterococcus faecalis NOT DETECTED NOT DETECTED Final   Enterococcus Faecium NOT DETECTED NOT DETECTED Final   Listeria monocytogenes NOT DETECTED NOT DETECTED Final   Staphylococcus species DETECTED (A) NOT DETECTED Final    Comment: CRITICAL RESULT CALLED TO, READ BACK BY AND VERIFIED WITH:  PHARMD MICHELLE LILLISTON 01/10/21 @0100  BY JW    Staphylococcus aureus (BCID) NOT DETECTED NOT DETECTED Final   Staphylococcus epidermidis DETECTED (A) NOT DETECTED Final    Comment: Methicillin (oxacillin) resistant coagulase negative staphylococcus. Possible blood culture contaminant (unless isolated from more than one blood culture draw or clinical case suggests pathogenicity). No antibiotic treatment is indicated for blood  culture contaminants. CRITICAL RESULT CALLED TO, READ BACK BY AND VERIFIED WITH: PHARMD MICHELLE LILLISTON 01/10/21 @0100  BY JW    Staphylococcus lugdunensis NOT DETECTED NOT DETECTED Final   Streptococcus species NOT DETECTED NOT DETECTED Final   Streptococcus agalactiae NOT DETECTED NOT DETECTED Final   Streptococcus pneumoniae NOT DETECTED NOT DETECTED Final   Streptococcus pyogenes NOT DETECTED NOT DETECTED Final   A.calcoaceticus-baumannii NOT DETECTED NOT DETECTED Final   Bacteroides fragilis NOT DETECTED NOT DETECTED Final   Enterobacterales NOT DETECTED NOT DETECTED Final   Enterobacter cloacae complex NOT DETECTED NOT DETECTED Final   Escherichia coli NOT DETECTED NOT DETECTED Final   Klebsiella aerogenes NOT DETECTED NOT DETECTED Final   Klebsiella oxytoca NOT DETECTED  NOT DETECTED Final   Klebsiella pneumoniae NOT DETECTED NOT DETECTED Final   Proteus species NOT DETECTED NOT DETECTED Final   Salmonella species NOT DETECTED NOT DETECTED Final   Serratia marcescens NOT DETECTED NOT DETECTED Final   Haemophilus influenzae NOT DETECTED NOT DETECTED Final   Neisseria meningitidis NOT DETECTED NOT DETECTED Final   Pseudomonas aeruginosa NOT DETECTED NOT DETECTED Final   Stenotrophomonas maltophilia NOT DETECTED NOT DETECTED Final   Candida albicans NOT DETECTED NOT DETECTED Final   Candida auris NOT DETECTED NOT DETECTED Final   Candida glabrata NOT DETECTED NOT DETECTED Final   Candida krusei NOT DETECTED NOT DETECTED Final   Candida parapsilosis NOT DETECTED NOT DETECTED Final   Candida tropicalis NOT DETECTED NOT DETECTED Final   Cryptococcus neoformans/gattii NOT DETECTED NOT DETECTED Final   Methicillin resistance mecA/C DETECTED (A) NOT DETECTED Final    Comment: CRITICAL RESULT CALLED TO, READ BACK BY AND VERIFIED WITH: PHARMD MICHELLE LILLISTON 01/10/21 @0100  BY JW Performed at [facility], [address], [place], [place] [address]   Blood culture (routine x 2)     Status: None (Preliminary result)   Collection Time: 01/08/21 10:33 PM   Specimen: BLOOD  Result Value Ref Range Status   Specimen Description   Final    BLOOD BLOOD RIGHT FOREARM Performed at [facility] [address], [place], [place] [address]    Special Requests   Final    BOTTLES DRAWN AEROBIC AND ANAEROBIC Blood Culture adequate volume Performed at [facility] [address], [place], [place] [address]    Culture   Final    NO GROWTH 1 DAY Performed at [facility], [place] [address], [place], [place] [address]    Report Status PENDING  Incomplete  Resp Panel by RT-PCR (Flu A&B, Covid) Nasopharyngeal Swab     Status: Abnormal   Collection Time: 01/09/21 12:23 PM   Specimen: Nasopharyngeal Swab; Nasopharyngeal(NP) swabs  in vial transport medium  Result Value Ref Range Status   SARS Coronavirus 2 by RT PCR POSITIVE (A) NEGATIVE Final    Comment: RESULT CALLED TO, READ BACK BY AND VERIFIED WITH: MANSFIELD,A. RN AT 1622 01/09/21 MULLINS,T (NOTE) SARS-CoV-2 target nucleic acids are DETECTED.  The SARS-CoV-2 RNA is generally detectable in upper respiratory specimens during the acute phase of infection. Positive results are indicative of the presence  of the identified virus, but do not rule out bacterial infection or co-infection with other pathogens not detected by the test. Clinical correlation with patient history and other diagnostic information is necessary to determine patient infection status. The expected result is Negative.  Fact Sheet for Patients: EntrepreneurPulse.com.au  Fact Sheet for Healthcare Providers: IncredibleEmployment.be  This test is not yet approved or cleared by the [place] FDA and  has been authorized for detection and/or diagnosis of SARS-CoV-2 by FDA under an Emergency Use Authorization (EUA).  This EUA will remain in effect (meaning this te st can be used) for the duration of  the COVID-19 declaration under Section 564(b)(1) of the Act, 21 U.S.C. section 360bbb-3(b)(1), unless the authorization is terminated or revoked sooner.     Influenza A by PCR NEGATIVE NEGATIVE Final   Influenza B by PCR NEGATIVE NEGATIVE Final    Comment: (NOTE) The Xpert Xpress SARS-CoV-2/FLU/RSV plus assay is intended as an aid in the diagnosis of influenza from Nasopharyngeal swab specimens and should not be used as a sole basis for treatment. Nasal washings and aspirates are unacceptable for Xpert Xpress SARS-CoV-2/FLU/RSV testing.  Fact Sheet for Patients: EntrepreneurPulse.com.au  Fact Sheet for Healthcare Providers: IncredibleEmployment.be  This test is not yet approved or cleared by the [place] FDA  and has been authorized for detection and/or diagnosis of SARS-CoV-2 by FDA under an Emergency Use Authorization (EUA). This EUA will remain in effect (meaning this test can be used) for the duration of the COVID-19 declaration under Section 564(b)(1) of the Act, 21 U.S.C. section 360bbb-3(b)(1), unless the authorization is terminated or revoked.  Performed at [facility] [place] [address], [place], [place] [address]   MRSA Next Gen by PCR, Nasal     Status: None   Collection Time: 01/10/21  2:45 AM   Specimen: Nasal Mucosa; Nasal Swab  Result Value Ref Range Status   MRSA by PCR Next Gen NOT DETECTED NOT DETECTED Final    Comment: (NOTE) The GeneXpert MRSA Assay (FDA approved for NASAL specimens only), is one component of a comprehensive MRSA colonization surveillance program. It is not intended to diagnose MRSA infection nor to guide or monitor treatment for MRSA infections. Test performance is not FDA approved in patients less than 50 years old. Performed at [facility], [place] [address] [place], [place] [address]      Labs: BNP (last 3 results) No results for input(s): BNP in the last 8760 hours. Basic Metabolic Panel: Recent Labs  Lab 01/08/21 2228 01/09/21 0341  NA 139 139  K 4.3 4.2  CL 103 102  CO2 27 26  GLUCOSE 112* 98  BUN 24* 22*  CREATININE 0.47 0.56  CALCIUM 9.7 9.7  MG  --  2.1  PHOS  --  3.8   Liver Function Tests: Recent Labs  Lab 01/08/21 2228 01/09/21 0341  AST 49* 44*  ALT 25 25  ALKPHOS 110 107  BILITOT 0.5 0.5  PROT 8.9* 8.6*  ALBUMIN 3.5 3.4*   No results for input(s): LIPASE, AMYLASE in the last 168 hours. No results for input(s): AMMONIA in the last 168 hours. CBC: Recent Labs  Lab 01/08/21 2228 01/09/21 0341  WBC 10.0 10.8*  NEUTROABS 6.3  --   HGB 14.6 14.0  HCT 46.8* 44.2  MCV 95.1 93.8  PLT 304 315   Cardiac Enzymes: No results for input(s): CKTOTAL, CKMB, CKMBINDEX, TROPONINI in  the last 168 hours. BNP: Invalid input(s): POCBNP CBG: No  results for input(s): GLUCAP in the last 168 hours. D-Dimer No results for input(s): DDIMER in the last 72 hours. Hgb A1c No results for input(s): HGBA1C in the last 72 hours. Lipid Profile No results for input(s): CHOL, HDL, LDLCALC, TRIG, CHOLHDL, LDLDIRECT in the last 72 hours. Thyroid function studies No results for input(s): TSH, T4TOTAL, T3FREE, THYROIDAB in the last 72 hours.  Invalid input(s): FREET3 Anemia work up No results for input(s): VITAMINB12, FOLATE, FERRITIN, TIBC, IRON, RETICCTPCT in the last 72 hours. Urinalysis    Component Value Date/Time   COLORURINE YELLOW 01/08/2021 2228   APPEARANCEUR TURBID (A) 01/08/2021 2228   LABSPEC 1.015 01/08/2021 2228   PHURINE 6.0 01/08/2021 2228   GLUCOSEU NEGATIVE 01/08/2021 2228   HGBUR SMALL (A) 01/08/2021 2228   BILIRUBINUR NEGATIVE 01/08/2021 2228   KETONESUR NEGATIVE 01/08/2021 2228   PROTEINUR 100 (A) 01/08/2021 2228   UROBILINOGEN 1.0 09/11/2010 1052   NITRITE POSITIVE (A) 01/08/2021 2228   LEUKOCYTESUR LARGE (A) 01/08/2021 2228   Sepsis Labs Invalid input(s): PROCALCITONIN,  WBC,  LACTICIDVEN Microbiology Recent Results (from the past 240 hour(s))  Blood culture (routine x 2)     Status: Abnormal (Preliminary result)   Collection Time: 01/08/21 10:28 PM   Specimen: BLOOD  Result Value Ref Range Status   Specimen Description   Final    BLOOD LEFT ANTECUBITAL Performed at [facility], [place] [address] [place], [place] [address]    Special Requests   Final    BOTTLES DRAWN AEROBIC AND ANAEROBIC Blood Culture results may not be optimal due to an excessive volume of blood received in culture bottles Performed at [facility] [address] [place], [place] [address]    Culture  Setup Time   Final    GRAM POSITIVE COCCI IN CLUSTERS ANAEROBIC BOTTLE ONLY CRITICAL RESULT CALLED TO, READ BACK BY AND VERIFIED WITH:  PHARMD MICHELLE LILLISTON 01/10/21 @0100  BY JW    Culture (A)  Final    STAPHYLOCOCCUS EPIDERMIDIS THE SIGNIFICANCE OF ISOLATING THIS ORGANISM FROM A SINGLE SET OF BLOOD CULTURES WHEN MULTIPLE SETS ARE DRAWN IS UNCERTAIN. PLEASE NOTIFY THE MICROBIOLOGY DEPARTMENT WITHIN ONE WEEK IF SPECIATION AND SENSITIVITIES ARE REQUIRED. Performed at [facility], [place] [address] [place], [place] [address]    Report Status PENDING  Incomplete  Urine Culture     Status: Abnormal   Collection Time: 01/08/21 10:28 PM   Specimen: In/Out Cath Urine  Result Value Ref Range Status   Specimen Description   Final    IN/OUT CATH URINE Performed at [facility] [address], [place], [place] [address]    Special Requests   Final    NONE Performed at [facility], [place] [address] [place], [place] [address]    Culture >=100,000 COLONIES/mL PSEUDOMONAS AERUGINOSA (A)  Final   Report Status 01/10/2021 FINAL  Final   Organism ID, Bacteria PSEUDOMONAS AERUGINOSA (A)  Final      Susceptibility   Pseudomonas aeruginosa - MIC*    CEFTAZIDIME 4 SENSITIVE Sensitive     CIPROFLOXACIN <=0.25 SENSITIVE Sensitive     GENTAMICIN <=1 SENSITIVE Sensitive     IMIPENEM 2 SENSITIVE Sensitive     PIP/TAZO 8 SENSITIVE Sensitive     CEFEPIME 2 SENSITIVE Sensitive     * >=100,000 COLONIES/mL PSEUDOMONAS AERUGINOSA  Blood Culture ID Panel (Reflexed)     Status: Abnormal   Collection Time: 01/08/21 10:28 PM  Result Value Ref Range  Status   Enterococcus faecalis NOT DETECTED NOT DETECTED Final   Enterococcus Faecium NOT DETECTED NOT DETECTED Final   Listeria monocytogenes NOT DETECTED NOT DETECTED Final   Staphylococcus species DETECTED (A) NOT DETECTED Final    Comment: CRITICAL RESULT CALLED TO, READ BACK BY AND VERIFIED WITH: PHARMD MICHELLE LILLISTON 01/10/21 @0100  BY JW    Staphylococcus aureus (BCID) NOT DETECTED NOT DETECTED Final   Staphylococcus epidermidis DETECTED (A)  NOT DETECTED Final    Comment: Methicillin (oxacillin) resistant coagulase negative staphylococcus. Possible blood culture contaminant (unless isolated from more than one blood culture draw or clinical case suggests pathogenicity). No antibiotic treatment is indicated for blood  culture contaminants. CRITICAL RESULT CALLED TO, READ BACK BY AND VERIFIED WITH: PHARMD MICHELLE LILLISTON 01/10/21 @0100  BY JW    Staphylococcus lugdunensis NOT DETECTED NOT DETECTED Final   Streptococcus species NOT DETECTED NOT DETECTED Final   Streptococcus agalactiae NOT DETECTED NOT DETECTED Final   Streptococcus pneumoniae NOT DETECTED NOT DETECTED Final   Streptococcus pyogenes NOT DETECTED NOT DETECTED Final   A.calcoaceticus-baumannii NOT DETECTED NOT DETECTED Final   Bacteroides fragilis NOT DETECTED NOT DETECTED Final   Enterobacterales NOT DETECTED NOT DETECTED Final   Enterobacter cloacae complex NOT DETECTED NOT DETECTED Final   Escherichia coli NOT DETECTED NOT DETECTED Final   Klebsiella aerogenes NOT DETECTED NOT DETECTED Final   Klebsiella oxytoca NOT DETECTED NOT DETECTED Final   Klebsiella pneumoniae NOT DETECTED NOT DETECTED Final   Proteus species NOT DETECTED NOT DETECTED Final   Salmonella species NOT DETECTED NOT DETECTED Final   Serratia marcescens NOT DETECTED NOT DETECTED Final   Haemophilus influenzae NOT DETECTED NOT DETECTED Final   Neisseria meningitidis NOT DETECTED NOT DETECTED Final   Pseudomonas aeruginosa NOT DETECTED NOT DETECTED Final   Stenotrophomonas maltophilia NOT DETECTED NOT DETECTED Final   Candida albicans NOT DETECTED NOT DETECTED Final   Candida auris NOT DETECTED NOT DETECTED Final   Candida glabrata NOT DETECTED NOT DETECTED Final   Candida krusei NOT DETECTED NOT DETECTED Final   Candida parapsilosis NOT DETECTED NOT DETECTED Final   Candida tropicalis NOT DETECTED NOT DETECTED Final   Cryptococcus neoformans/gattii NOT DETECTED NOT DETECTED Final    Methicillin resistance mecA/C DETECTED (A) NOT DETECTED Final    Comment: CRITICAL RESULT CALLED TO, READ BACK BY AND VERIFIED WITH: PHARMD MICHELLE LILLISTON 01/10/21 @0100  BY JW Performed at [facility], [address], [place], [place] [address]   Blood culture (routine x 2)     Status: None (Preliminary result)   Collection Time: 01/08/21 10:33 PM   Specimen: BLOOD  Result Value Ref Range Status   Specimen Description   Final    BLOOD BLOOD RIGHT FOREARM Performed at [facility] [address], [place], [place] [address]    Special Requests   Final    BOTTLES DRAWN AEROBIC AND ANAEROBIC Blood Culture adequate volume Performed at [facility] [address], [place], [place] [address]    Culture   Final    NO GROWTH 1 DAY Performed at [facility], [place] [address], [place],  [address]    Report Status PENDING  Incomplete  Resp Panel by RT-PCR (Flu A&B, Covid) Nasopharyngeal Swab     Status: Abnormal   Collection Time: 01/09/21 12:23 PM   Specimen: Nasopharyngeal Swab; Nasopharyngeal(NP) swabs in vial transport medium  Result Value Ref Range Status   SARS Coronavirus 2 by RT PCR POSITIVE (A) NEGATIVE  Final    Comment: RESULT CALLED TO, READ BACK BY AND VERIFIED WITH: MANSFIELD,A. RN AT 1622 01/09/21 MULLINS,T (NOTE) SARS-CoV-2 target nucleic acids are DETECTED.  The SARS-CoV-2 RNA is generally detectable in upper respiratory specimens during the acute phase of infection. Positive results are indicative of the presence of the identified virus, but do not rule out bacterial infection or co-infection with other pathogens not detected by the test. Clinical correlation with patient history and other diagnostic information is necessary to determine patient infection status. The expected result is Negative.  Fact Sheet for Patients: BloggerCourse.com  Fact Sheet for Healthcare  Providers: SeriousBroker.it  This test is not yet approved or cleared by the [place] FDA and  has been authorized for detection and/or diagnosis of SARS-CoV-2 by FDA under an Emergency Use Authorization (EUA).  This EUA will remain in effect (meaning this te st can be used) for the duration of  the COVID-19 declaration under Section 564(b)(1) of the Act, 21 U.S.C. section 360bbb-3(b)(1), unless the authorization is terminated or revoked sooner.     Influenza A by PCR NEGATIVE NEGATIVE Final   Influenza B by PCR NEGATIVE NEGATIVE Final    Comment: (NOTE) The Xpert Xpress SARS-CoV-2/FLU/RSV plus assay is intended as an aid in the diagnosis of influenza from Nasopharyngeal swab specimens and should not be used as a sole basis for treatment. Nasal washings and aspirates are unacceptable for Xpert Xpress SARS-CoV-2/FLU/RSV testing.  Fact Sheet for Patients: BloggerCourse.com  Fact Sheet for Healthcare Providers: SeriousBroker.it  This test is not yet approved or cleared by the [place] FDA and has been authorized for detection and/or diagnosis of SARS-CoV-2 by FDA under an Emergency Use Authorization (EUA). This EUA will remain in effect (meaning this test can be used) for the duration of the COVID-19 declaration under Section 564(b)(1) of the Act, 21 U.S.C. section 360bbb-3(b)(1), unless the authorization is terminated or revoked.  Performed at [facility], [address] [place], [place] [address]   MRSA Next Gen by PCR, Nasal     Status: None   Collection Time: 01/10/21  2:45 AM   Specimen: Nasal Mucosa; Nasal Swab  Result Value Ref Range Status   MRSA by PCR Next Gen NOT DETECTED NOT DETECTED Final    Comment: (NOTE) The GeneXpert MRSA Assay (FDA approved for NASAL specimens only), is one component of a comprehensive MRSA colonization surveillance program. It is not  intended to diagnose MRSA infection nor to guide or monitor treatment for MRSA infections. Test performance is not FDA approved in patients less than 53 years old. Performed at [facility], [address] [place], [place] [address]      Time coordinating discharge: 35 minutes  SIGNED:   Jacquelin Hawking, MD [facility] 01/10/2021, 12:14 PM

## 2021-01-10 NOTE — Discharge Instructions (Signed)
[name],  You were in the hospital with confusion which was secondary to a urinary tract infection. This is being treated with antibiotics and you are back to baseline.

## 2021-01-10 NOTE — Evaluation (Signed)
Physical Therapy Evaluation Only Patient Details Name: [name] MRN: [id number] DOB: [birth year] Today's Date: 01/10/2021  History of Present Illness  [name] is a 50 y.o. female admitted with covid. Recently discharged from the hospital on 12/25/20 to SNF for advance wound care of her right ischial decubitus ulcer. PMH: MS, bedbound since 2015, HTN, chronic constipation, pressure wounds, R ischial osteomyelitis, spastic paraplegia  Clinical Impression  Pt functioning slightly below baseline, requiring max A with rolling R/L reports typically requires "a little less". Pt bedbound since 2015, has mechanical hoyer lift and w/c at home but doesn't use them due to space constraints and fearful of mechanical lift. Pt able to reach for bedrails to assist in rotating upper trunk, no AROM in BLE, resistant to PROM due to fear of pain. Pt reports active with therapy at SNF working on bed mobility and positioning to protect skin integrity. Pt hopeful to d/c home quickly with family to assist.      Recommendations for follow up therapy are one component of a multi-disciplinary discharge planning process, led by the attending physician.  Recommendations may be updated based on patient status, additional functional criteria and insurance authorization.  Follow Up Recommendations Skilled nursing-short term rehab (<3 hours/day)    Assistance Recommended at Discharge Frequent or constant Supervision/Assistance  Functional Status Assessment Patient has had a recent decline in their functional status and/or demonstrates limited ability to make significant improvements in function in a reasonable and predictable amount of time  Equipment Recommendations  None recommended by PT    Recommendations for Other Services OT consult     Precautions / Restrictions Precautions Precautions: Fall Precaution Comments: bedbound since 2015      Mobility  Bed Mobility Overal bed mobility: Needs  Assistance Bed Mobility: Rolling Rolling: Max assist  General bed mobility comments: pt slow to reach for bedrail, able to pull with BUE to assist in rotating trunk but weak, ultimately requires max A to roll entire body into sidelying R/L    Transfers     Ambulation/Gait     Stairs            Wheelchair Mobility    Modified Rankin (Stroke Patients Only)       Balance          Pertinent Vitals/Pain Pain Assessment: Faces Faces Pain Scale: Hurts a little bit Pain Location: LLE with movement Pain Descriptors / Indicators: Grimacing Pain Intervention(s): Limited activity within patient's tolerance;Monitored during session;Repositioned    Home Living Family/patient expects to be discharged to:: Skilled nursing facility    Prior Function               Mobility Comments: Pt reports bedbound since 2015, has mechanical hoyer lift and w/c but home is too small for w/c and doesn't like mechanical hoyer so doesn't get OOB and doesn't sit EOB ADLs Comments: Pt reports parents provide bed baths, change brief     Hand Dominance        Extremity/Trunk Assessment        Lower Extremity Assessment Lower Extremity Assessment: RLE deficits/detail;LLE deficits/detail RLE Deficits / Details: PF contracture, no AROM throughout, resting in knee extension with slight hip external rotation, requests no PROM due to pain LLE Deficits / Details: PF contracture, no AROM throughout, resting in ~20 deg knee flexion and hip external rotation, requests no PROM due to pain       Communication      Cognition Arousal/Alertness: Awake/alert Behavior During Therapy:  WFL for tasks assessed/performed Overall Cognitive Status: Within Functional Limits for tasks assessed     General Comments      Exercises     Assessment/Plan    PT Assessment All further PT needs can be met in the next venue of care  PT Problem List Decreased strength;Decreased range of motion;Decreased  activity tolerance;Decreased balance;Decreased mobility;Decreased coordination;Impaired sensation;Decreased skin integrity;Pain;Impaired tone       PT Treatment Interventions      PT Goals (Current goals can be found in the Care Plan section)  Acute Rehab PT Goals Patient Stated Goal: return home PT Goal Formulation: With patient Time For Goal Achievement: 01/24/21 Potential to Achieve Goals: Fair    Frequency     Barriers to discharge        Co-evaluation               AM-PAC PT "6 Clicks" Mobility  Outcome Measure Help needed turning from your back to your side while in a flat bed without using bedrails?: A Lot Help needed moving from lying on your back to sitting on the side of a flat bed without using bedrails?: Total Help needed moving to and from a bed to a chair (including a wheelchair)?: Total Help needed standing up from a chair using your arms (e.g., wheelchair or bedside chair)?: Total Help needed to walk in hospital room?: Total Help needed climbing 3-5 steps with a railing? : Total 6 Click Score: 7    End of Session   Activity Tolerance: Patient tolerated treatment well Patient left: in bed;with call bell/phone within reach;with nursing/sitter in room Nurse Communication: Mobility status PT Visit Diagnosis: Other abnormalities of gait and mobility (R26.89);Muscle weakness (generalized) (M62.81);Other symptoms and signs involving the nervous system (H40.352)    Time: 4818-5909 PT Time Calculation (min) (ACUTE ONLY): 16 min   Charges:   PT Evaluation $PT Eval Moderate Complexity: 1 Mod           Tori Elizbeth Posa PT, DPT 01/10/21, 2:03 PM

## 2021-01-10 NOTE — Progress Notes (Signed)
Attempted to call report again, left number with desk to call back.

## 2021-01-10 NOTE — Progress Notes (Signed)
Called report, nurse taking report stated they did not know she was coming today. Report given, SBAR followed, questions asked and [facility] came and transported patient.

## 2021-01-10 NOTE — NC FL2 (Signed)
[place] MEDICAID FL2 LEVEL OF CARE SCREENING TOOL     IDENTIFICATION  Patient Name: [name] Birthdate: Jul 04, 1970 Sex: female Admission Date (Current Location): 01/08/2021  [facility] and [place] Number:  Producer, television/film/video and Address:  [facility],  [address], [place] [address]      Provider Number: [id number]  Attending Physician Name and Address:  Narda Bonds, MD  Relative Name and Phone Number:       Current Level of Care: Hospital Recommended Level of Care: Skilled Nursing Facility Prior Approval Number:    Date Approved/Denied:   PASRR Number: [id number]  Discharge Plan: SNF    Current Diagnoses: Patient Active Problem List   Diagnosis Date Noted   Acute metabolic encephalopathy 01/09/2021   Decubitus ulcer 12/13/2020   Decubitus ulcer, unstageable with infection (HCC) 12/12/2020   UTI (urinary tract infection) 12/12/2020   AKI (acute kidney injury) (HCC) 11/12/2020   Sacral osteomyelitis (HCC) 01/28/2019   Stage 4 decubitus ulcer (HCC) 01/25/2019   Cellulitis 01/25/2019   Decubitus skin ulcer 01/25/2019   Anxiety 05/13/2013   Essential hypertension, benign 04/25/2013   Encounter for long-term (current) use of other medications 04/25/2013   Constipation 04/09/2013   Displaced bimalleolar fracture of left ankle 03/30/2013   Multiple sclerosis (HCC) 08/25/2012   Chest pain 08/25/2012   Panic attack 08/25/2012    Orientation RESPIRATION BLADDER Height & Weight     Self, Time, Situation, Place  Normal External catheter Weight: 76 kg Height:  5\' 2"  (157.5 cm)  BEHAVIORAL SYMPTOMS/MOOD NEUROLOGICAL BOWEL NUTRITION STATUS      Continent Diet (see d/c summary)  AMBULATORY STATUS COMMUNICATION OF NEEDS Skin    (bedbound) Verbally Other (Comment) ((pressure injury,buttocks right, stage 4-full thickness tissue loss w/exposed nbone, tendon or muscle, wound/incision thigh left ,medial inner left thigh))                        Personal Care Assistance Level of Assistance  Bathing, Feeding, Dressing Bathing Assistance: Maximum assistance Feeding assistance: Independent Dressing Assistance: Maximum assistance     Functional Limitations Info  Sight, Hearing, Speech Sight Info: Adequate Hearing Info: Adequate Speech Info: Adequate    SPECIAL CARE FACTORS FREQUENCY                       Contractures      Additional Factors Info  Code Status, Allergies Code Status Info: full Allergies Info: Tizanidine, Silver, Tomato           Current Medications (01/10/2021):  This is the current hospital active medication list Current Facility-Administered Medications  Medication Dose Route Frequency Provider Last Rate Last Admin   acetaminophen (TYLENOL) tablet 650 mg  650 mg Oral Q6H PRN 13/04/2020, MD   650 mg at 01/10/21 13/02/22   baclofen (LIORESAL) tablet 20 mg  20 mg Oral TID 5573 N, DO   20 mg at 01/10/21 13/02/22   ciprofloxacin (CIPRO) tablet 250 mg  250 mg Oral BID 2202, MD       enoxaparin (LOVENOX) injection 40 mg  40 mg Subcutaneous Q24H Hall, Carole N, DO   40 mg at 01/10/21 0813   guaiFENesin-dextromethorphan (ROBITUSSIN DM) 100-10 MG/5ML syrup 5 mL  5 mL Oral Q4H PRN 13/02/22 N, DO   5 mL at 01/10/21 13/02/22   lactated ringers infusion   Intravenous Continuous 5427 N, DO 50 mL/hr at 01/10/21  0630 Infusion Verify at 01/10/21 0630   meloxicam (MOBIC) tablet 7.5 mg  7.5 mg Oral Daily Mariel Aloe, MD   7.5 mg at 01/10/21 [id number]   polyethylene glycol (MIRALAX / GLYCOLAX) packet 17 g  17 g Oral BID PRN Kayleen Memos, DO       senna-docusate (Senokot-S) tablet 2 tablet  2 tablet Oral BID PRN Irene Pap N, DO       Zinc Oxide (TRIPLE PASTE) 12.8 % ointment   Topical BID Kayleen Memos, DO   Given at 01/10/21 0813   Zinc Oxide (TRIPLE PASTE) 12.8 % ointment   Topical PRN Mariel Aloe, MD         Discharge Medications: Please see discharge summary for a list of  discharge medications.  Relevant Imaging Results:  Relevant Lab Results:   Additional Information SSN [id number]  Trish Mage, [place]

## 2021-01-10 NOTE — Progress Notes (Signed)
PHARMACY - PHYSICIAN COMMUNICATION CRITICAL VALUE ALERT - BLOOD CULTURE IDENTIFICATION (BCID)  [name] is an 50 y.o. female who presented to [facility] on 01/08/2021 with a chief complaint of altered mental status.  Assessment:  Currently on Cefepime for urosepsis.  UCx + PsA.   AMS has resolved, now afebrile.  Anaerobic bottle of 1 set blood cx collected now + MRSE.  This is likely contaminant.   Name of physician (or Provider) Contacted: Johann Capers, NP  Current antibiotics: Cefepime 2gm IV q8h  Changes to prescribed antibiotics recommended:  None  Results for orders placed or performed during the hospital encounter of 01/08/21  Blood Culture ID Panel (Reflexed) (Collected: 01/08/2021 10:28 PM)  Result Value Ref Range   Enterococcus faecalis NOT DETECTED NOT DETECTED   Enterococcus Faecium NOT DETECTED NOT DETECTED   Listeria monocytogenes NOT DETECTED NOT DETECTED   Staphylococcus species DETECTED (A) NOT DETECTED   Staphylococcus aureus (BCID) NOT DETECTED NOT DETECTED   Staphylococcus epidermidis DETECTED (A) NOT DETECTED   Staphylococcus lugdunensis NOT DETECTED NOT DETECTED   Streptococcus species NOT DETECTED NOT DETECTED   Streptococcus agalactiae NOT DETECTED NOT DETECTED   Streptococcus pneumoniae NOT DETECTED NOT DETECTED   Streptococcus pyogenes NOT DETECTED NOT DETECTED   A.calcoaceticus-baumannii NOT DETECTED NOT DETECTED   Bacteroides fragilis NOT DETECTED NOT DETECTED   Enterobacterales NOT DETECTED NOT DETECTED   Enterobacter cloacae complex NOT DETECTED NOT DETECTED   Escherichia coli NOT DETECTED NOT DETECTED   Klebsiella aerogenes NOT DETECTED NOT DETECTED   Klebsiella oxytoca NOT DETECTED NOT DETECTED   Klebsiella pneumoniae NOT DETECTED NOT DETECTED   Proteus species NOT DETECTED NOT DETECTED   Salmonella species NOT DETECTED NOT DETECTED   Serratia marcescens NOT DETECTED NOT DETECTED   Haemophilus influenzae NOT DETECTED NOT DETECTED    Neisseria meningitidis NOT DETECTED NOT DETECTED   Pseudomonas aeruginosa NOT DETECTED NOT DETECTED   Stenotrophomonas maltophilia NOT DETECTED NOT DETECTED   Candida albicans NOT DETECTED NOT DETECTED   Candida auris NOT DETECTED NOT DETECTED   Candida glabrata NOT DETECTED NOT DETECTED   Candida krusei NOT DETECTED NOT DETECTED   Candida parapsilosis NOT DETECTED NOT DETECTED   Candida tropicalis NOT DETECTED NOT DETECTED   Cryptococcus neoformans/gattii NOT DETECTED NOT DETECTED   Methicillin resistance mecA/C DETECTED (A) NOT DETECTED    Junita Push PharmD 01/10/2021  1:21 AM

## 2021-01-10 NOTE — TOC Transition Note (Signed)
Transition of Care [facility]) - CM/SW Discharge Note   Patient Details  Name: [name] MRN: [id number] Date of Birth: 1970-09-27  [facility] of [facility]) CM/SW Contact:  Ida Rogue, LCSW Phone Number: 01/10/2021, 2:10 PM   Clinical Narrative:   Patient who is stable for d/c will return to [facility] today. Family informed.  Insurance authorization request initiated per facility request. PTAR arranged. Nursing, please call report to [phone]. Room 3225. TOC sign off.    Final next level of care: Skilled Nursing Facility Barriers to Discharge: Barriers Resolved   Patient Goals and CMS Choice        Discharge Placement                       Discharge Plan and Services                                     Social Determinants of Health (SDOH) Interventions     Readmission Risk Interventions No flowsheet data found.

## 2021-01-10 NOTE — Progress Notes (Signed)
Attempted to call report, received machine response

## 2021-01-11 DIAGNOSIS — L8995 Pressure ulcer of unspecified site, unstageable: Secondary | ICD-10-CM | POA: Diagnosis not present

## 2021-01-11 DIAGNOSIS — Z741 Need for assistance with personal care: Secondary | ICD-10-CM | POA: Diagnosis not present

## 2021-01-11 DIAGNOSIS — G9341 Metabolic encephalopathy: Secondary | ICD-10-CM | POA: Diagnosis not present

## 2021-01-11 DIAGNOSIS — I10 Essential (primary) hypertension: Secondary | ICD-10-CM | POA: Diagnosis not present

## 2021-01-11 DIAGNOSIS — R278 Other lack of coordination: Secondary | ICD-10-CM | POA: Diagnosis not present

## 2021-01-11 DIAGNOSIS — G629 Polyneuropathy, unspecified: Secondary | ICD-10-CM | POA: Diagnosis not present

## 2021-01-11 DIAGNOSIS — L89314 Pressure ulcer of right buttock, stage 4: Secondary | ICD-10-CM | POA: Diagnosis not present

## 2021-01-11 DIAGNOSIS — G35 Multiple sclerosis: Secondary | ICD-10-CM | POA: Diagnosis not present

## 2021-01-11 DIAGNOSIS — M25561 Pain in right knee: Secondary | ICD-10-CM | POA: Diagnosis not present

## 2021-01-11 DIAGNOSIS — R531 Weakness: Secondary | ICD-10-CM | POA: Diagnosis not present

## 2021-01-11 DIAGNOSIS — M869 Osteomyelitis, unspecified: Secondary | ICD-10-CM | POA: Diagnosis not present

## 2021-01-11 DIAGNOSIS — N390 Urinary tract infection, site not specified: Secondary | ICD-10-CM | POA: Diagnosis not present

## 2021-01-11 DIAGNOSIS — R1311 Dysphagia, oral phase: Secondary | ICD-10-CM | POA: Diagnosis not present

## 2021-01-11 DIAGNOSIS — K5900 Constipation, unspecified: Secondary | ICD-10-CM | POA: Diagnosis not present

## 2021-01-11 DIAGNOSIS — Z7401 Bed confinement status: Secondary | ICD-10-CM | POA: Diagnosis not present

## 2021-01-11 DIAGNOSIS — R1312 Dysphagia, oropharyngeal phase: Secondary | ICD-10-CM | POA: Diagnosis not present

## 2021-01-11 DIAGNOSIS — K56609 Unspecified intestinal obstruction, unspecified as to partial versus complete obstruction: Secondary | ICD-10-CM | POA: Diagnosis not present

## 2021-01-11 DIAGNOSIS — L8990 Pressure ulcer of unspecified site, unspecified stage: Secondary | ICD-10-CM | POA: Diagnosis not present

## 2021-01-11 DIAGNOSIS — M6281 Muscle weakness (generalized): Secondary | ICD-10-CM | POA: Diagnosis not present

## 2021-01-11 LAB — CULTURE, BLOOD (ROUTINE X 2)

## 2021-01-13 DIAGNOSIS — Z791 Long term (current) use of non-steroidal anti-inflammatories (NSAID): Secondary | ICD-10-CM | POA: Diagnosis not present

## 2021-01-13 DIAGNOSIS — K5901 Slow transit constipation: Secondary | ICD-10-CM | POA: Diagnosis not present

## 2021-01-13 DIAGNOSIS — G35 Multiple sclerosis: Secondary | ICD-10-CM | POA: Diagnosis not present

## 2021-01-13 DIAGNOSIS — L89322 Pressure ulcer of left buttock, stage 2: Secondary | ICD-10-CM | POA: Diagnosis not present

## 2021-01-13 DIAGNOSIS — R32 Unspecified urinary incontinence: Secondary | ICD-10-CM | POA: Diagnosis not present

## 2021-01-13 DIAGNOSIS — G8929 Other chronic pain: Secondary | ICD-10-CM | POA: Diagnosis not present

## 2021-01-13 DIAGNOSIS — Z9181 History of falling: Secondary | ICD-10-CM | POA: Diagnosis not present

## 2021-01-13 DIAGNOSIS — G8220 Paraplegia, unspecified: Secondary | ICD-10-CM | POA: Diagnosis not present

## 2021-01-13 DIAGNOSIS — I10 Essential (primary) hypertension: Secondary | ICD-10-CM | POA: Diagnosis not present

## 2021-01-14 LAB — CULTURE, BLOOD (ROUTINE X 2)
Culture: NO GROWTH
Special Requests: ADEQUATE

## 2021-01-15 DIAGNOSIS — Z9181 History of falling: Secondary | ICD-10-CM | POA: Diagnosis not present

## 2021-01-15 DIAGNOSIS — R32 Unspecified urinary incontinence: Secondary | ICD-10-CM | POA: Diagnosis not present

## 2021-01-15 DIAGNOSIS — L89322 Pressure ulcer of left buttock, stage 2: Secondary | ICD-10-CM | POA: Diagnosis not present

## 2021-01-15 DIAGNOSIS — G8220 Paraplegia, unspecified: Secondary | ICD-10-CM | POA: Diagnosis not present

## 2021-01-15 DIAGNOSIS — I10 Essential (primary) hypertension: Secondary | ICD-10-CM | POA: Diagnosis not present

## 2021-01-15 DIAGNOSIS — K5901 Slow transit constipation: Secondary | ICD-10-CM | POA: Diagnosis not present

## 2021-01-15 DIAGNOSIS — G35 Multiple sclerosis: Secondary | ICD-10-CM | POA: Diagnosis not present

## 2021-01-15 DIAGNOSIS — Z791 Long term (current) use of non-steroidal anti-inflammatories (NSAID): Secondary | ICD-10-CM | POA: Diagnosis not present

## 2021-01-15 DIAGNOSIS — G8929 Other chronic pain: Secondary | ICD-10-CM | POA: Diagnosis not present

## 2021-01-16 DIAGNOSIS — K5901 Slow transit constipation: Secondary | ICD-10-CM | POA: Diagnosis not present

## 2021-01-16 DIAGNOSIS — G35 Multiple sclerosis: Secondary | ICD-10-CM | POA: Diagnosis not present

## 2021-01-16 DIAGNOSIS — L89322 Pressure ulcer of left buttock, stage 2: Secondary | ICD-10-CM | POA: Diagnosis not present

## 2021-01-16 DIAGNOSIS — G8929 Other chronic pain: Secondary | ICD-10-CM | POA: Diagnosis not present

## 2021-01-16 DIAGNOSIS — R32 Unspecified urinary incontinence: Secondary | ICD-10-CM | POA: Diagnosis not present

## 2021-01-16 DIAGNOSIS — I10 Essential (primary) hypertension: Secondary | ICD-10-CM | POA: Diagnosis not present

## 2021-01-16 DIAGNOSIS — G8220 Paraplegia, unspecified: Secondary | ICD-10-CM | POA: Diagnosis not present

## 2021-01-16 DIAGNOSIS — Z9181 History of falling: Secondary | ICD-10-CM | POA: Diagnosis not present

## 2021-01-16 DIAGNOSIS — Z791 Long term (current) use of non-steroidal anti-inflammatories (NSAID): Secondary | ICD-10-CM | POA: Diagnosis not present

## 2021-01-18 DIAGNOSIS — G8220 Paraplegia, unspecified: Secondary | ICD-10-CM | POA: Diagnosis not present

## 2021-01-18 DIAGNOSIS — G35 Multiple sclerosis: Secondary | ICD-10-CM | POA: Diagnosis not present

## 2021-01-18 DIAGNOSIS — L89322 Pressure ulcer of left buttock, stage 2: Secondary | ICD-10-CM | POA: Diagnosis not present

## 2021-01-19 DIAGNOSIS — R32 Unspecified urinary incontinence: Secondary | ICD-10-CM | POA: Diagnosis not present

## 2021-01-19 DIAGNOSIS — G8220 Paraplegia, unspecified: Secondary | ICD-10-CM | POA: Diagnosis not present

## 2021-01-19 DIAGNOSIS — G35 Multiple sclerosis: Secondary | ICD-10-CM | POA: Diagnosis not present

## 2021-01-19 DIAGNOSIS — I10 Essential (primary) hypertension: Secondary | ICD-10-CM | POA: Diagnosis not present

## 2021-01-19 DIAGNOSIS — Z0000 Encounter for general adult medical examination without abnormal findings: Secondary | ICD-10-CM | POA: Diagnosis not present

## 2021-01-19 DIAGNOSIS — K5901 Slow transit constipation: Secondary | ICD-10-CM | POA: Diagnosis not present

## 2021-01-19 DIAGNOSIS — K5909 Other constipation: Secondary | ICD-10-CM | POA: Diagnosis not present

## 2021-01-19 DIAGNOSIS — L89322 Pressure ulcer of left buttock, stage 2: Secondary | ICD-10-CM | POA: Diagnosis not present

## 2021-01-19 DIAGNOSIS — G8929 Other chronic pain: Secondary | ICD-10-CM | POA: Diagnosis not present

## 2021-01-19 DIAGNOSIS — L89159 Pressure ulcer of sacral region, unspecified stage: Secondary | ICD-10-CM | POA: Diagnosis not present

## 2021-01-19 DIAGNOSIS — Z9181 History of falling: Secondary | ICD-10-CM | POA: Diagnosis not present

## 2021-01-19 DIAGNOSIS — Z791 Long term (current) use of non-steroidal anti-inflammatories (NSAID): Secondary | ICD-10-CM | POA: Diagnosis not present

## 2021-01-23 DIAGNOSIS — G8929 Other chronic pain: Secondary | ICD-10-CM | POA: Diagnosis not present

## 2021-01-23 DIAGNOSIS — G8220 Paraplegia, unspecified: Secondary | ICD-10-CM | POA: Diagnosis not present

## 2021-01-23 DIAGNOSIS — Z791 Long term (current) use of non-steroidal anti-inflammatories (NSAID): Secondary | ICD-10-CM | POA: Diagnosis not present

## 2021-01-23 DIAGNOSIS — G35 Multiple sclerosis: Secondary | ICD-10-CM | POA: Diagnosis not present

## 2021-01-23 DIAGNOSIS — Z9181 History of falling: Secondary | ICD-10-CM | POA: Diagnosis not present

## 2021-01-23 DIAGNOSIS — I10 Essential (primary) hypertension: Secondary | ICD-10-CM | POA: Diagnosis not present

## 2021-01-23 DIAGNOSIS — R32 Unspecified urinary incontinence: Secondary | ICD-10-CM | POA: Diagnosis not present

## 2021-01-23 DIAGNOSIS — K5901 Slow transit constipation: Secondary | ICD-10-CM | POA: Diagnosis not present

## 2021-01-23 DIAGNOSIS — L89322 Pressure ulcer of left buttock, stage 2: Secondary | ICD-10-CM | POA: Diagnosis not present

## 2021-01-26 DIAGNOSIS — Z791 Long term (current) use of non-steroidal anti-inflammatories (NSAID): Secondary | ICD-10-CM | POA: Diagnosis not present

## 2021-01-26 DIAGNOSIS — Z9181 History of falling: Secondary | ICD-10-CM | POA: Diagnosis not present

## 2021-01-26 DIAGNOSIS — L89322 Pressure ulcer of left buttock, stage 2: Secondary | ICD-10-CM | POA: Diagnosis not present

## 2021-01-26 DIAGNOSIS — K5901 Slow transit constipation: Secondary | ICD-10-CM | POA: Diagnosis not present

## 2021-01-26 DIAGNOSIS — G35 Multiple sclerosis: Secondary | ICD-10-CM | POA: Diagnosis not present

## 2021-01-26 DIAGNOSIS — G8929 Other chronic pain: Secondary | ICD-10-CM | POA: Diagnosis not present

## 2021-01-26 DIAGNOSIS — G8220 Paraplegia, unspecified: Secondary | ICD-10-CM | POA: Diagnosis not present

## 2021-01-26 DIAGNOSIS — I10 Essential (primary) hypertension: Secondary | ICD-10-CM | POA: Diagnosis not present

## 2021-01-26 DIAGNOSIS — R32 Unspecified urinary incontinence: Secondary | ICD-10-CM | POA: Diagnosis not present

## 2021-01-30 DIAGNOSIS — R32 Unspecified urinary incontinence: Secondary | ICD-10-CM | POA: Diagnosis not present

## 2021-01-30 DIAGNOSIS — G8929 Other chronic pain: Secondary | ICD-10-CM | POA: Diagnosis not present

## 2021-01-30 DIAGNOSIS — L89322 Pressure ulcer of left buttock, stage 2: Secondary | ICD-10-CM | POA: Diagnosis not present

## 2021-01-30 DIAGNOSIS — Z791 Long term (current) use of non-steroidal anti-inflammatories (NSAID): Secondary | ICD-10-CM | POA: Diagnosis not present

## 2021-01-30 DIAGNOSIS — G35 Multiple sclerosis: Secondary | ICD-10-CM | POA: Diagnosis not present

## 2021-01-30 DIAGNOSIS — K5901 Slow transit constipation: Secondary | ICD-10-CM | POA: Diagnosis not present

## 2021-01-30 DIAGNOSIS — G8220 Paraplegia, unspecified: Secondary | ICD-10-CM | POA: Diagnosis not present

## 2021-01-30 DIAGNOSIS — I10 Essential (primary) hypertension: Secondary | ICD-10-CM | POA: Diagnosis not present

## 2021-01-30 DIAGNOSIS — Z9181 History of falling: Secondary | ICD-10-CM | POA: Diagnosis not present

## 2021-02-02 DIAGNOSIS — G629 Polyneuropathy, unspecified: Secondary | ICD-10-CM | POA: Diagnosis not present

## 2021-02-02 DIAGNOSIS — L89314 Pressure ulcer of right buttock, stage 4: Secondary | ICD-10-CM | POA: Diagnosis not present

## 2021-02-02 DIAGNOSIS — G8929 Other chronic pain: Secondary | ICD-10-CM | POA: Diagnosis not present

## 2021-02-02 DIAGNOSIS — N8184 Pelvic muscle wasting: Secondary | ICD-10-CM | POA: Diagnosis not present

## 2021-02-02 DIAGNOSIS — L89312 Pressure ulcer of right buttock, stage 2: Secondary | ICD-10-CM | POA: Diagnosis not present

## 2021-02-02 DIAGNOSIS — G35 Multiple sclerosis: Secondary | ICD-10-CM | POA: Diagnosis not present

## 2021-02-02 DIAGNOSIS — G8220 Paraplegia, unspecified: Secondary | ICD-10-CM | POA: Diagnosis not present

## 2021-02-02 DIAGNOSIS — I959 Hypotension, unspecified: Secondary | ICD-10-CM | POA: Diagnosis not present

## 2021-02-02 DIAGNOSIS — I10 Essential (primary) hypertension: Secondary | ICD-10-CM | POA: Diagnosis not present

## 2021-02-06 DIAGNOSIS — L89314 Pressure ulcer of right buttock, stage 4: Secondary | ICD-10-CM | POA: Diagnosis not present

## 2021-02-06 DIAGNOSIS — G35 Multiple sclerosis: Secondary | ICD-10-CM | POA: Diagnosis not present

## 2021-02-06 DIAGNOSIS — G629 Polyneuropathy, unspecified: Secondary | ICD-10-CM | POA: Diagnosis not present

## 2021-02-06 DIAGNOSIS — I10 Essential (primary) hypertension: Secondary | ICD-10-CM | POA: Diagnosis not present

## 2021-02-06 DIAGNOSIS — L89312 Pressure ulcer of right buttock, stage 2: Secondary | ICD-10-CM | POA: Diagnosis not present

## 2021-02-06 DIAGNOSIS — G8220 Paraplegia, unspecified: Secondary | ICD-10-CM | POA: Diagnosis not present

## 2021-02-06 DIAGNOSIS — G8929 Other chronic pain: Secondary | ICD-10-CM | POA: Diagnosis not present

## 2021-02-06 DIAGNOSIS — I959 Hypotension, unspecified: Secondary | ICD-10-CM | POA: Diagnosis not present

## 2021-02-06 DIAGNOSIS — N8184 Pelvic muscle wasting: Secondary | ICD-10-CM | POA: Diagnosis not present

## 2021-02-08 DIAGNOSIS — K5909 Other constipation: Secondary | ICD-10-CM | POA: Diagnosis not present

## 2021-02-08 DIAGNOSIS — I10 Essential (primary) hypertension: Secondary | ICD-10-CM | POA: Diagnosis not present

## 2021-02-09 DIAGNOSIS — N8184 Pelvic muscle wasting: Secondary | ICD-10-CM | POA: Diagnosis not present

## 2021-02-09 DIAGNOSIS — G8220 Paraplegia, unspecified: Secondary | ICD-10-CM | POA: Diagnosis not present

## 2021-02-09 DIAGNOSIS — L89314 Pressure ulcer of right buttock, stage 4: Secondary | ICD-10-CM | POA: Diagnosis not present

## 2021-02-09 DIAGNOSIS — L89312 Pressure ulcer of right buttock, stage 2: Secondary | ICD-10-CM | POA: Diagnosis not present

## 2021-02-09 DIAGNOSIS — G629 Polyneuropathy, unspecified: Secondary | ICD-10-CM | POA: Diagnosis not present

## 2021-02-09 DIAGNOSIS — I10 Essential (primary) hypertension: Secondary | ICD-10-CM | POA: Diagnosis not present

## 2021-02-09 DIAGNOSIS — G8929 Other chronic pain: Secondary | ICD-10-CM | POA: Diagnosis not present

## 2021-02-09 DIAGNOSIS — I959 Hypotension, unspecified: Secondary | ICD-10-CM | POA: Diagnosis not present

## 2021-02-09 DIAGNOSIS — G35 Multiple sclerosis: Secondary | ICD-10-CM | POA: Diagnosis not present

## 2021-02-13 DIAGNOSIS — I10 Essential (primary) hypertension: Secondary | ICD-10-CM | POA: Diagnosis not present

## 2021-02-13 DIAGNOSIS — G8929 Other chronic pain: Secondary | ICD-10-CM | POA: Diagnosis not present

## 2021-02-13 DIAGNOSIS — N8184 Pelvic muscle wasting: Secondary | ICD-10-CM | POA: Diagnosis not present

## 2021-02-13 DIAGNOSIS — G629 Polyneuropathy, unspecified: Secondary | ICD-10-CM | POA: Diagnosis not present

## 2021-02-13 DIAGNOSIS — G8220 Paraplegia, unspecified: Secondary | ICD-10-CM | POA: Diagnosis not present

## 2021-02-13 DIAGNOSIS — L89314 Pressure ulcer of right buttock, stage 4: Secondary | ICD-10-CM | POA: Diagnosis not present

## 2021-02-13 DIAGNOSIS — I959 Hypotension, unspecified: Secondary | ICD-10-CM | POA: Diagnosis not present

## 2021-02-13 DIAGNOSIS — L89312 Pressure ulcer of right buttock, stage 2: Secondary | ICD-10-CM | POA: Diagnosis not present

## 2021-02-13 DIAGNOSIS — G35 Multiple sclerosis: Secondary | ICD-10-CM | POA: Diagnosis not present

## 2021-02-16 DIAGNOSIS — G35 Multiple sclerosis: Secondary | ICD-10-CM | POA: Diagnosis not present

## 2021-02-16 DIAGNOSIS — G8220 Paraplegia, unspecified: Secondary | ICD-10-CM | POA: Diagnosis not present

## 2021-02-16 DIAGNOSIS — I959 Hypotension, unspecified: Secondary | ICD-10-CM | POA: Diagnosis not present

## 2021-02-16 DIAGNOSIS — G629 Polyneuropathy, unspecified: Secondary | ICD-10-CM | POA: Diagnosis not present

## 2021-02-16 DIAGNOSIS — I10 Essential (primary) hypertension: Secondary | ICD-10-CM | POA: Diagnosis not present

## 2021-02-16 DIAGNOSIS — L89312 Pressure ulcer of right buttock, stage 2: Secondary | ICD-10-CM | POA: Diagnosis not present

## 2021-02-16 DIAGNOSIS — L89314 Pressure ulcer of right buttock, stage 4: Secondary | ICD-10-CM | POA: Diagnosis not present

## 2021-02-16 DIAGNOSIS — N8184 Pelvic muscle wasting: Secondary | ICD-10-CM | POA: Diagnosis not present

## 2021-02-16 DIAGNOSIS — G8929 Other chronic pain: Secondary | ICD-10-CM | POA: Diagnosis not present

## 2021-02-18 ENCOUNTER — Emergency Department (HOSPITAL_COMMUNITY): Payer: Medicare PPO

## 2021-02-18 ENCOUNTER — Other Ambulatory Visit: Payer: Self-pay

## 2021-02-18 ENCOUNTER — Inpatient Hospital Stay (HOSPITAL_COMMUNITY)
Admission: EM | Admit: 2021-02-18 | Discharge: 2021-02-22 | DRG: 871 | Disposition: A | Discharge status: Home Health | Payer: Medicare PPO | Attending: Internal Medicine | Admitting: Internal Medicine

## 2021-02-18 ENCOUNTER — Encounter (HOSPITAL_COMMUNITY): Payer: Self-pay

## 2021-02-18 DIAGNOSIS — L89154 Pressure ulcer of sacral region, stage 4: Secondary | ICD-10-CM | POA: Diagnosis present

## 2021-02-18 DIAGNOSIS — R6520 Severe sepsis without septic shock: Secondary | ICD-10-CM

## 2021-02-18 DIAGNOSIS — J9811 Atelectasis: Secondary | ICD-10-CM | POA: Diagnosis not present

## 2021-02-18 DIAGNOSIS — R58 Hemorrhage, not elsewhere classified: Secondary | ICD-10-CM | POA: Diagnosis not present

## 2021-02-18 DIAGNOSIS — I10 Essential (primary) hypertension: Secondary | ICD-10-CM | POA: Diagnosis present

## 2021-02-18 DIAGNOSIS — L89159 Pressure ulcer of sacral region, unspecified stage: Secondary | ICD-10-CM

## 2021-02-18 DIAGNOSIS — R110 Nausea: Secondary | ICD-10-CM | POA: Diagnosis not present

## 2021-02-18 DIAGNOSIS — N179 Acute kidney failure, unspecified: Secondary | ICD-10-CM | POA: Diagnosis not present

## 2021-02-18 DIAGNOSIS — R9431 Abnormal electrocardiogram [ECG] [EKG]: Secondary | ICD-10-CM | POA: Diagnosis not present

## 2021-02-18 DIAGNOSIS — Z8616 Personal history of COVID-19: Secondary | ICD-10-CM

## 2021-02-18 DIAGNOSIS — I864 Gastric varices: Secondary | ICD-10-CM | POA: Diagnosis present

## 2021-02-18 DIAGNOSIS — Z87891 Personal history of nicotine dependence: Secondary | ICD-10-CM

## 2021-02-18 DIAGNOSIS — M4628 Osteomyelitis of vertebra, sacral and sacrococcygeal region: Secondary | ICD-10-CM | POA: Diagnosis present

## 2021-02-18 DIAGNOSIS — M1990 Unspecified osteoarthritis, unspecified site: Secondary | ICD-10-CM | POA: Diagnosis present

## 2021-02-18 DIAGNOSIS — R109 Unspecified abdominal pain: Secondary | ICD-10-CM | POA: Diagnosis not present

## 2021-02-18 DIAGNOSIS — Z79899 Other long term (current) drug therapy: Secondary | ICD-10-CM

## 2021-02-18 DIAGNOSIS — R6521 Severe sepsis with septic shock: Secondary | ICD-10-CM | POA: Diagnosis not present

## 2021-02-18 DIAGNOSIS — N200 Calculus of kidney: Secondary | ICD-10-CM | POA: Diagnosis not present

## 2021-02-18 DIAGNOSIS — Z7401 Bed confinement status: Secondary | ICD-10-CM | POA: Diagnosis not present

## 2021-02-18 DIAGNOSIS — G35 Multiple sclerosis: Secondary | ICD-10-CM | POA: Diagnosis present

## 2021-02-18 DIAGNOSIS — E440 Moderate protein-calorie malnutrition: Secondary | ICD-10-CM | POA: Diagnosis present

## 2021-02-18 DIAGNOSIS — L8995 Pressure ulcer of unspecified site, unstageable: Secondary | ICD-10-CM | POA: Diagnosis not present

## 2021-02-18 DIAGNOSIS — A419 Sepsis, unspecified organism: Secondary | ICD-10-CM | POA: Diagnosis not present

## 2021-02-18 DIAGNOSIS — I959 Hypotension, unspecified: Secondary | ICD-10-CM | POA: Diagnosis not present

## 2021-02-18 DIAGNOSIS — L8994 Pressure ulcer of unspecified site, stage 4: Secondary | ICD-10-CM | POA: Diagnosis present

## 2021-02-18 DIAGNOSIS — Z6826 Body mass index (BMI) 26.0-26.9, adult: Secondary | ICD-10-CM | POA: Diagnosis not present

## 2021-02-18 DIAGNOSIS — G9340 Encephalopathy, unspecified: Secondary | ICD-10-CM | POA: Diagnosis not present

## 2021-02-18 DIAGNOSIS — Z23 Encounter for immunization: Secondary | ICD-10-CM | POA: Diagnosis not present

## 2021-02-18 DIAGNOSIS — F419 Anxiety disorder, unspecified: Secondary | ICD-10-CM | POA: Diagnosis present

## 2021-02-18 DIAGNOSIS — E876 Hypokalemia: Secondary | ICD-10-CM | POA: Diagnosis present

## 2021-02-18 DIAGNOSIS — L89314 Pressure ulcer of right buttock, stage 4: Secondary | ICD-10-CM | POA: Diagnosis not present

## 2021-02-18 DIAGNOSIS — F29 Unspecified psychosis not due to a substance or known physiological condition: Secondary | ICD-10-CM | POA: Diagnosis not present

## 2021-02-18 DIAGNOSIS — G8220 Paraplegia, unspecified: Secondary | ICD-10-CM | POA: Diagnosis not present

## 2021-02-18 LAB — CBC WITH DIFFERENTIAL/PLATELET
Abs Immature Granulocytes: 0.02 10*3/uL (ref 0.00–0.07)
Basophils Absolute: 0 10*3/uL (ref 0.0–0.1)
Basophils Relative: 0 %
Eosinophils Absolute: 0.2 10*3/uL (ref 0.0–0.5)
Eosinophils Relative: 3 %
HCT: 44.2 % (ref 36.0–46.0)
Hemoglobin: 14 g/dL (ref 12.0–15.0)
Immature Granulocytes: 0 %
Lymphocytes Relative: 39 %
Lymphs Abs: 2.8 10*3/uL (ref 0.7–4.0)
MCH: 30.3 pg (ref 26.0–34.0)
MCHC: 31.7 g/dL (ref 30.0–36.0)
MCV: 95.7 fL (ref 80.0–100.0)
Monocytes Absolute: 0.7 10*3/uL (ref 0.1–1.0)
Monocytes Relative: 9 %
Neutro Abs: 3.4 10*3/uL (ref 1.7–7.7)
Neutrophils Relative %: 49 %
Platelets: 224 10*3/uL (ref 150–400)
RBC: 4.62 MIL/uL (ref 3.87–5.11)
RDW: 15.8 % — ABNORMAL HIGH (ref 11.5–15.5)
WBC: 7.1 10*3/uL (ref 4.0–10.5)
nRBC: 0 % (ref 0.0–0.2)

## 2021-02-18 LAB — PROTIME-INR
INR: 1.1 (ref 0.8–1.2)
Prothrombin Time: 13.8 seconds (ref 11.4–15.2)

## 2021-02-18 LAB — I-STAT VENOUS BLOOD GAS, ED
Acid-Base Excess: 0 mmol/L (ref 0.0–2.0)
Acid-Base Excess: 0 mmol/L (ref 0.0–2.0)
Bicarbonate: 25 mmol/L (ref 20.0–28.0)
Bicarbonate: 26.2 mmol/L (ref 20.0–28.0)
Calcium, Ion: 1.13 mmol/L — ABNORMAL LOW (ref 1.15–1.40)
Calcium, Ion: 1.17 mmol/L (ref 1.15–1.40)
HCT: 34 % — ABNORMAL LOW (ref 36.0–46.0)
HCT: 41 % (ref 36.0–46.0)
Hemoglobin: 11.6 g/dL — ABNORMAL LOW (ref 12.0–15.0)
Hemoglobin: 13.9 g/dL (ref 12.0–15.0)
O2 Saturation: 87 %
O2 Saturation: 99 %
Potassium: 3.3 mmol/L — ABNORMAL LOW (ref 3.5–5.1)
Potassium: 3.6 mmol/L (ref 3.5–5.1)
Sodium: 138 mmol/L (ref 135–145)
Sodium: 141 mmol/L (ref 135–145)
TCO2: 26 mmol/L (ref 22–32)
TCO2: 28 mmol/L (ref 22–32)
pCO2, Ven: 40.6 mmHg — ABNORMAL LOW (ref 44.0–60.0)
pCO2, Ven: 47 mmHg (ref 44.0–60.0)
pH, Ven: 7.355 (ref 7.250–7.430)
pH, Ven: 7.397 (ref 7.250–7.430)
pO2, Ven: 138 mmHg — ABNORMAL HIGH (ref 32.0–45.0)
pO2, Ven: 53 mmHg — ABNORMAL HIGH (ref 32.0–45.0)

## 2021-02-18 LAB — COMPREHENSIVE METABOLIC PANEL
ALT: 9 U/L (ref 0–44)
AST: 26 U/L (ref 15–41)
Albumin: 2.5 g/dL — ABNORMAL LOW (ref 3.5–5.0)
Alkaline Phosphatase: 89 U/L (ref 38–126)
Anion gap: 8 (ref 5–15)
BUN: 20 mg/dL (ref 6–20)
CO2: 24 mmol/L (ref 22–32)
Calcium: 8.8 mg/dL — ABNORMAL LOW (ref 8.9–10.3)
Chloride: 103 mmol/L (ref 98–111)
Creatinine, Ser: 0.76 mg/dL (ref 0.44–1.00)
GFR, Estimated: >60 mL/min (ref >60)
Glucose, Bld: 115 mg/dL — ABNORMAL HIGH (ref 70–99)
Potassium: 3.4 mmol/L — ABNORMAL LOW (ref 3.5–5.1)
Sodium: 135 mmol/L (ref 135–145)
Total Bilirubin: 0.4 mg/dL (ref 0.3–1.2)
Total Protein: 6.6 g/dL (ref 6.5–8.1)

## 2021-02-18 LAB — I-STAT BETA HCG BLOOD, ED (MC, WL, AP ONLY): I-stat hCG, quantitative: <5 m[IU]/mL (ref <5)

## 2021-02-18 LAB — TYPE AND SCREEN
ABO/RH(D): A POS
Antibody Screen: NEGATIVE

## 2021-02-18 LAB — I-STAT CHEM 8, ED
BUN: 22 mg/dL — ABNORMAL HIGH (ref 6–20)
Calcium, Ion: 1.13 mmol/L — ABNORMAL LOW (ref 1.15–1.40)
Chloride: 101 mmol/L (ref 98–111)
Creatinine, Ser: 0.9 mg/dL (ref 0.44–1.00)
Glucose, Bld: 116 mg/dL — ABNORMAL HIGH (ref 70–99)
HCT: 42 % (ref 36.0–46.0)
Hemoglobin: 14.3 g/dL (ref 12.0–15.0)
Potassium: 3.4 mmol/L — ABNORMAL LOW (ref 3.5–5.1)
Sodium: 139 mmol/L (ref 135–145)
TCO2: 26 mmol/L (ref 22–32)

## 2021-02-18 LAB — APTT: aPTT: 41 seconds — ABNORMAL HIGH (ref 24–36)

## 2021-02-18 LAB — LACTIC ACID, PLASMA
Lactic Acid, Venous: 2.1 mmol/L (ref 0.5–1.9)
Lactic Acid, Venous: 1.9 mmol/L (ref 0.5–1.9)

## 2021-02-18 MED ORDER — NOREPINEPHRINE 4 MG/250ML-% IV SOLN
2.0000 ug/min | INTRAVENOUS | Status: DC
  Administered 2021-02-19: 2 ug/min via INTRAVENOUS

## 2021-02-18 MED ORDER — SODIUM CHLORIDE 0.9 % IV SOLN: INTRAVENOUS | Status: DC

## 2021-02-18 MED ORDER — LACTATED RINGERS IV BOLUS (SEPSIS)
1000.0000 mL | Freq: Once | INTRAVENOUS | Status: AC
  Administered 2021-02-18: 1000 mL via INTRAVENOUS

## 2021-02-18 MED ORDER — VANCOMYCIN HCL 1750 MG/350ML IV SOLN
1750.0000 mg | Freq: Once | INTRAVENOUS | Status: AC
  Administered 2021-02-19: 1750 mg via INTRAVENOUS
  Filled 2021-02-18: qty 350

## 2021-02-18 MED ORDER — HEPARIN SODIUM (PORCINE) 5000 UNIT/ML IJ SOLN
5000.0000 [IU] | Freq: Three times a day (TID) | INTRAMUSCULAR | Status: DC
  Administered 2021-02-19 – 2021-02-22 (×11): 5000 [IU] via SUBCUTANEOUS
  Filled 2021-02-18 (×11): qty 1

## 2021-02-18 MED ORDER — SODIUM CHLORIDE 0.9 % IV BOLUS
1000.0000 mL | Freq: Once | INTRAVENOUS | Status: AC
  Administered 2021-02-18: 1000 mL via INTRAVENOUS

## 2021-02-18 MED ORDER — PIPERACILLIN-TAZOBACTAM 3.375 G IVPB
3.3750 g | Freq: Three times a day (TID) | INTRAVENOUS | Status: DC
  Administered 2021-02-19 – 2021-02-20 (×4): 3.375 g via INTRAVENOUS
  Filled 2021-02-18 (×7): qty 50

## 2021-02-18 MED ORDER — POLYETHYLENE GLYCOL 3350 17 G PO PACK: 17.0000 g | PACK | Freq: Every day | ORAL | Status: DC | PRN

## 2021-02-18 MED ORDER — DOCUSATE SODIUM 100 MG PO CAPS: 100.0000 mg | ORAL_CAPSULE | Freq: Two times a day (BID) | ORAL | Status: DC | PRN

## 2021-02-18 MED ORDER — LACTATED RINGERS IV SOLN: INTRAVENOUS | Status: DC

## 2021-02-18 MED ORDER — SODIUM CHLORIDE 0.9 % IV SOLN: 250.0000 mL | INTRAVENOUS | Status: DC

## 2021-02-18 MED ORDER — PANTOPRAZOLE SODIUM 40 MG IV SOLR
40.0000 mg | Freq: Every day | INTRAVENOUS | Status: DC
  Administered 2021-02-19 (×2): 40 mg via INTRAVENOUS
  Filled 2021-02-18 (×2): qty 40

## 2021-02-18 MED ORDER — NOREPINEPHRINE 4 MG/250ML-% IV SOLN
0.0000 ug/min | INTRAVENOUS | Status: DC
  Filled 2021-02-18: qty 250

## 2021-02-18 MED ORDER — PIPERACILLIN-TAZOBACTAM 3.375 G IVPB 30 MIN
3.3750 g | Freq: Once | INTRAVENOUS | Status: AC
  Administered 2021-02-18: 3.375 g via INTRAVENOUS
  Filled 2021-02-18: qty 50

## 2021-02-18 NOTE — Progress Notes (Addendum)
Pharmacy Antibiotic Note  [name] is a 50 y.o. female admitted on 02/18/2021 with r/o sepsis.  Pharmacy has been consulted for zosyn dosing.  Plan: -Zosyn 3.375gm IV q8h -Will follow renal function, cultures and clinical progress   Height: 5\' 2"  (157.5 cm) Weight: 86.2 kg (190 lb) IBW/kg (Calculated) : 50.1  Temp (24hrs), Avg:98.4 F (36.9 C), Min:98.4 F (36.9 C), Max:98.4 F (36.9 C)  Recent Labs  Lab 02/18/21 1844 02/18/21 1856 02/18/21 2029  WBC 7.1  --   --   CREATININE 0.76 0.90  --   LATICACIDVEN 2.1*  --  1.9    Estimated Creatinine Clearance: 74.5 mL/min (by C-G formula based on SCr of 0.9 mg/dL).    Allergies  Allergen Reactions   Silver    Tizanidine Diarrhea   Tomato      Thank you for allowing pharmacy to be a part of this patient's care.  2030, PharmD Clinical Pharmacist **Pharmacist phone directory can now be found on [email or web address] ([email or web address]).  Listed under [facility].

## 2021-02-18 NOTE — Progress Notes (Signed)
An USGPIV (ultrasound guided PIV) has been placed for short-term vasopressor infusion. Correctly placed ivWatch must be used when administering Vasopressors. Should this treatment be needed beyond 72 hours, central line access should be obtained.  It will be the responsibility of the bedside nurse to follow best practice to prevent extravasations.   

## 2021-02-18 NOTE — ED Notes (Signed)
MD notified of BP - manual BP to be obtained

## 2021-02-18 NOTE — ED Notes (Signed)
Pt desatting as low as the 70s while sleeping with a good pleth - pt placed on 2L [place] - MD notified

## 2021-02-18 NOTE — ED Notes (Addendum)
Pt has a tunneled pressure ulcer on her right buttock, wound appears to be packed with gauze that is bloody, wound is not actively bleeding

## 2021-02-18 NOTE — ED Notes (Signed)
MD notified of lactic level

## 2021-02-18 NOTE — ED Notes (Signed)
Critical care MD at bedside 

## 2021-02-18 NOTE — ED Notes (Signed)
Per MD Jayme Cloud hold off on levo at this time - pt bp 101/71 - per MD if patient has 2 low Bps in a row start levo

## 2021-02-18 NOTE — ED Triage Notes (Signed)
Pt bib ems from for complications r/t pressure wound on right buttock. EMS reports stage 5 wound, pt reports wound is continuing to bleed, no obvious hemorrhage, pt has hx of sepsis r/t that wound

## 2021-02-18 NOTE — H&P (Signed)
NAME:  [name], MRN:  [id number], DOB:  05-31-1970, LOS: 0 ADMISSION DATE:  02/18/2021, CONSULTATION DATE:  02/18/21 REFERRING MD:  Rubin Payor, CHIEF COMPLAINT:   Pain in R buttock  History of Present Illness:   [name] is a 50 yo woman with a history of MS (bed bound) Here with worsening wound on R buttock, bleeding.  Hx of Chronic osteomyelitis in R sacrum.   Desatted to 70s while sleeping, ok on 2L [place]   IN ED given zosyn, vanc ordered 2L fluid boluses given    Hypokalemia Lact 2.1 --> 1.9 Cr. 0.9 was 0.56 (AKI)  CXR Mild Lower lobe opacities.   Lives with Mom and Dad, primary caregiver is her mom right now, her father has medical issues.  Pertinent  Medical History   Pressure ulcer Sacral osteomyelitis HTN Anxiety MS Former smoker (40 pack years) Covid 01/09/21  Significant Hospital Events: Including procedures, antibiotic start and stop dates in addition to other pertinent events     Interim History / Subjective:    Objective   Blood pressure (!) 84/55, pulse 65, temperature 98.4 F (36.9 C), temperature source Rectal, resp. rate 11, height 5\' 2"  (1.575 m), weight 86.2 kg, SpO2 100 %.       No intake or output data in the 24 hours ending 02/18/21 2158 Filed Weights   02/18/21 1843  Weight: 86.2 kg    Examination: General: Somewhat sleepy, although alert enough to speak in full sentences and remain awake while talking, falls asleep when not interacting.  HENT: NCAT  Lungs: CTAB Cardiovascular: RRR no mgr  Abdomen: NT, ND, NBS  Extremities: Deep Wound to muscle on R buttock, some mildsurrounding erythema but not warm to touch, no purulence visible from surface.  Blood soaked and clotted gauze fell out of wound on turning.   Neuro: awake, alert, though falls asleep easily  GU: Purewick in place.   Resolved Hospital Problem list     Assessment & Plan:  Hypotension: Presumably 2/2 sepsis, source not clear.  Wound has been bleeding and oozing  more, so consider wound infection or osteomyelitis most likely source.  Consider PNA.   UA pending.   ON broad spectrum abtx.  CT abd and pelvis pending to evaluate wound.  Levophed ordered.    AKI: Cr actually elevated from 0.47 in 10/22 to 0.9 now.  Monitor UOP and Cr Closely.   Hold gabapentin for now until Cr improves or remains stable.   MS - bed bound at baseline Cont baclofen.    Best Practice (right click and "Reselect all SmartList Selections" daily)   Diet/type: clear liquids DVT prophylaxis: other GI prophylaxis: PPI Lines: N/A Foley:  N/A Code Status:  full code Last date of multidisciplinary goals of care discussion []   Labs   CBC: Recent Labs  Lab 02/18/21 1844 02/18/21 1856  WBC 7.1  --   NEUTROABS 3.4  --   HGB 14.0 14.3  HCT 44.2 42.0  MCV 95.7  --   PLT 224  --     Basic Metabolic Panel: Recent Labs  Lab 02/18/21 1844 02/18/21 1856  NA 135 139  K 3.4* 3.4*  CL 103 101  CO2 24  --   GLUCOSE 115* 116*  BUN 20 22*  CREATININE 0.76 0.90  CALCIUM 8.8*  --    GFR: Estimated Creatinine Clearance: 74.5 mL/min (by C-G formula based on SCr of 0.9 mg/dL). Recent Labs  Lab 02/18/21 1844 02/18/21 2029  WBC 7.1  --  LATICACIDVEN 2.1* 1.9    Liver Function Tests: Recent Labs  Lab 02/18/21 1844  AST 26  ALT 9  ALKPHOS 89  BILITOT 0.4  PROT 6.6  ALBUMIN 2.5*   No results for input(s): LIPASE, AMYLASE in the last 168 hours. No results for input(s): AMMONIA in the last 168 hours.  ABG    Component Value Date/Time   TCO2 26 02/18/2021 1856     Coagulation Profile: Recent Labs  Lab 02/18/21 1844  INR 1.1    Cardiac Enzymes: No results for input(s): CKTOTAL, CKMB, CKMBINDEX, TROPONINI in the last 168 hours.  HbA1C: No results found for: HGBA1C  CBG: No results for input(s): GLUCAP in the last 168 hours.  Review of Systems:   Negative aside from some lethargy and some increased bleeding from wound. Review of Systems   Constitutional:  Negative for fever.  HENT:  Negative for hearing loss.   Eyes:  Negative for blurred vision.  Respiratory:  Negative for cough.   Cardiovascular:  Negative for chest pain.  Gastrointestinal:  Negative for heartburn.  Genitourinary:  Negative for dysuria.  Musculoskeletal:  Negative for myalgias.  Skin:  Negative for rash.  Neurological:  Positive for weakness. Negative for dizziness.  Endo/Heme/Allergies:  Negative for polydipsia.  Psychiatric/Behavioral:  Negative for depression.     Past Medical History:  She,  has a past medical history of Arthritis and MS (multiple sclerosis) (HCC).   Surgical History:   Past Surgical History:  Procedure Laterality Date   NO PAST SURGERIES     ORIF ANKLE FRACTURE Left 03/30/2013   Procedure: OPEN REDUCTION INTERNAL FIXATION (ORIF) LEFT ANKLE FRACTURE;  Surgeon: Toni Arthurs, MD;  Location: [facility] OR;  Service: Orthopedics;  Laterality: Left;     Social History:   reports that she quit smoking about 12 years ago. She has a 40.00 pack-year smoking history. She quit smokeless tobacco use about 2 years ago. She reports that she does not drink alcohol and does not use drugs.   Family History:  Her family history is not on file.   Allergies Allergies  Allergen Reactions   Silver    Tizanidine Diarrhea   Tomato      Home Medications  Prior to Admission medications   Medication Sig Start Date End Date Taking? Authorizing Provider  acetaminophen (TYLENOL) 325 MG tablet Take 2 tablets (650 mg total) by mouth every 6 (six) hours as needed for mild pain (or Fever >/= 101). 11/13/20   Masters, Katie, DO  baclofen (LIORESAL) 20 MG tablet Take 20 mg by mouth 3 (three) times daily. 11/05/18   [provider]  diclofenac Sodium (VOLTAREN) 1 % GEL Apply 4 g topically 4 (four) times daily. Apply to joint or muscle pains 12/25/20   Princess Bruins, DO  feeding supplement, ENSURE ENLIVE, (ENSURE ENLIVE) LIQD Take 237 mLs by mouth 2 (two)  times daily between meals. Patient not taking: No sig reported 02/01/19   Lorin Glass, MD  gabapentin (NEURONTIN) 300 MG capsule Take 300-600 mg by mouth See admin instructions. 300 mg  morning, noon and dinner and 2 capsules (600mg ) at bedtime. 12/14/18   [provider]  meloxicam (MOBIC) 7.5 MG tablet Take 7.5 mg by mouth daily. 11/05/18   [provider]  Multiple Vitamin (MULTIVITAMIN) capsule Take 1 capsule by mouth daily.    [provider]  polyethylene glycol (MIRALAX / GLYCOLAX) 17 g packet Take 17 g by mouth 2 (two) times daily as needed for moderate  constipation or mild constipation. 11/13/20   Masters, Katie, DO  senna-docusate (SENOKOT-S) 8.6-50 MG tablet Take 2 tablets by mouth 2 (two) times daily as needed for mild constipation. 11/13/20   Masters, Katie, DO  vitamin C (ASCORBIC ACID) 500 MG tablet Take 500 mg by mouth daily.    [provider]  Zinc Oxide (TRIPLE PASTE) 12.8 % ointment Apply topically 2 (two) times daily. Apply AROUND sacral wound area. Then follow Dakin's order. 12/25/20   Princess Bruins, DO     Critical care time: 45 minutes

## 2021-02-18 NOTE — ED Provider Notes (Addendum)
[facility] EMERGENCY DEPARTMENT Provider Note   CSN: [id number] Arrival date & time: 02/18/21  1806     History Chief Complaint  Patient presents with   Wound Check    [name] is a 50 y.o. female.  The history is provided by the patient and medical records.  Wound Check This is a chronic (known chronic osteo right sacrum) problem. Episode onset: 2 years. The problem occurs constantly. The problem has been gradually worsening (patient concerned for bleeding from wound). Pertinent negatives include no chest pain, no abdominal pain, no headaches and no shortness of breath. Nothing aggravates the symptoms. Nothing relieves the symptoms. Treatments tried: wound care.      Past Medical History:  Diagnosis Date   Arthritis    MS (multiple sclerosis) ([place])     Patient Active Problem List   Diagnosis Date Noted   Acute metabolic encephalopathy 99991111   Decubitus ulcer 12/13/2020   Decubitus ulcer, unstageable with infection ([place]) 12/12/2020   UTI (urinary tract infection) 12/12/2020   AKI (acute kidney injury) ([place]) 11/12/2020   Sacral osteomyelitis ([place]) 01/28/2019   Stage 4 decubitus ulcer ([place]) 01/25/2019   Cellulitis 01/25/2019   Decubitus skin ulcer 01/25/2019   Anxiety 05/13/2013   Essential hypertension, benign 04/25/2013   Encounter for long-term (current) use of other medications 04/25/2013   Constipation 04/09/2013   Displaced bimalleolar fracture of left ankle 03/30/2013   Multiple sclerosis ([place]) 08/25/2012   Chest pain 08/25/2012   Panic attack 08/25/2012    Past Surgical History:  Procedure Laterality Date   NO PAST SURGERIES     ORIF ANKLE FRACTURE Left 03/30/2013   Procedure: OPEN REDUCTION INTERNAL FIXATION (ORIF) LEFT ANKLE FRACTURE;  Surgeon: Wylene Simmer, MD;  Location: [place];  Service: Orthopedics;  Laterality: Left;     OB History   No obstetric history on file.     History reviewed. No pertinent family  history.  Social History   Tobacco Use   Smoking status: Former    Packs/day: 2.00    Years: 20.00    Pack years: 40.00    Types: Cigarettes    Quit date: 07/03/2008    Years since quitting: 12.6   Smokeless tobacco: Former    Quit date: 01/10/2019   Tobacco comments:    [place] quit 01/2019  Substance Use Topics   Alcohol use: No   Drug use: No    Home Medications Prior to Admission medications   Medication Sig Start Date End Date Taking? Authorizing Provider  acetaminophen (TYLENOL) 325 MG tablet Take 2 tablets (650 mg total) by mouth every 6 (six) hours as needed for mild pain (or Fever >/= 101). 11/13/20   Masters, Katie, DO  baclofen (LIORESAL) 20 MG tablet Take 20 mg by mouth 3 (three) times daily. 11/05/18   [provider]  diclofenac Sodium (VOLTAREN) 1 % GEL Apply 4 g topically 4 (four) times daily. Apply to joint or muscle pains 12/25/20   Merrily Brittle, DO  feeding supplement, ENSURE ENLIVE, (ENSURE ENLIVE) LIQD Take 237 mLs by mouth 2 (two) times daily between meals. Patient not taking: No sig reported 02/01/19   Terrilee Croak, MD  gabapentin (NEURONTIN) 300 MG capsule Take 300-600 mg by mouth See admin instructions. 300 mg  morning, noon and dinner and 2 capsules (600mg ) at bedtime. 12/14/18   [provider]  meloxicam (MOBIC) 7.5 MG tablet Take 7.5 mg by mouth daily. 11/05/18   [provider]  Multiple Vitamin (MULTIVITAMIN) capsule Take 1 capsule by mouth daily.    [provider]  polyethylene glycol (MIRALAX / GLYCOLAX) 17 g packet Take 17 g by mouth 2 (two) times daily as needed for moderate constipation or mild constipation. 11/13/20   Masters, Katie, DO  senna-docusate (SENOKOT-S) 8.6-50 MG tablet Take 2 tablets by mouth 2 (two) times daily as needed for mild constipation. 11/13/20   Masters, Katie, DO  vitamin C (ASCORBIC ACID) 500 MG tablet Take 500 mg by mouth daily.    [provider]  Zinc Oxide  (TRIPLE PASTE) 12.8 % ointment Apply topically 2 (two) times daily. Apply AROUND sacral wound area. Then follow Dakin's order. 12/25/20   Merrily Brittle, DO    Allergies    Silver, Tizanidine, and Tomato  Review of Systems   Review of Systems  Constitutional:  Positive for fatigue. Negative for chills and fever.  HENT:  Negative for ear pain and sore throat.   Eyes:  Negative for pain and visual disturbance.  Respiratory:  Negative for cough and shortness of breath.   Cardiovascular:  Negative for chest pain and palpitations.  Gastrointestinal:  Negative for abdominal pain and vomiting.  Genitourinary:  Negative for dysuria and hematuria.  Musculoskeletal:  Negative for arthralgias and back pain.  Skin:  Positive for wound. Negative for color change and rash.  Neurological:  Positive for weakness. Negative for seizures, syncope and headaches.  All other systems reviewed and are negative.  Physical Exam Updated Vital Signs BP 108/67   Pulse 80   Temp 98.4 F (36.9 C) (Rectal)   Resp 15   Ht 5\' 2"  (1.575 m)   Wt 86.2 kg   SpO2 100%   BMI 34.75 kg/m   Physical Exam Vitals and nursing note reviewed.  Constitutional:      General: She is not in acute distress.    Appearance: Normal appearance. She is well-developed.  HENT:     Head: Normocephalic and atraumatic.     Right Ear: External ear normal.     Left Ear: External ear normal.     Nose: Nose normal. No congestion or rhinorrhea.     Mouth/Throat:     Mouth: Mucous membranes are moist.  Eyes:     Extraocular Movements: Extraocular movements intact.     Conjunctiva/sclera: Conjunctivae normal.     Pupils: Pupils are equal, round, and reactive to light.  Cardiovascular:     Rate and Rhythm: Normal rate and regular rhythm.     Pulses: Normal pulses.     Heart sounds: No murmur heard. Pulmonary:     Effort: Pulmonary effort is normal. No respiratory distress.     Breath sounds: Normal breath sounds. No wheezing,  rhonchi or rales.  Abdominal:     General: Abdomen is flat. Bowel sounds are normal.     Palpations: Abdomen is soft.     Tenderness: There is no abdominal tenderness. There is no guarding or rebound.  Musculoskeletal:        General: No swelling, tenderness or deformity.     Cervical back: Normal range of motion and neck supple. No rigidity.  Skin:    General: Skin is warm and dry.     Capillary Refill: Capillary refill takes less than 2 seconds.     Findings: Lesion (Chronic R sacral ulcer that probes deep to bone. Foul smelling bloody discharge noted. No surrounding erythema, warmth, tenderness) present.  Neurological:     General: No focal deficit  present.     Mental Status: She is alert and oriented to person, place, and time.  Psychiatric:        Mood and Affect: Mood normal.    ED Results / Procedures / Treatments   Labs (all labs ordered are listed, but only abnormal results are displayed) Labs Reviewed  LACTIC ACID, PLASMA - Abnormal; Notable for the following components:      Result Value   Lactic Acid, Venous 2.1 (*)    All other components within normal limits  COMPREHENSIVE METABOLIC PANEL - Abnormal; Notable for the following components:   Potassium 3.4 (*)    Glucose, Bld 115 (*)    Calcium 8.8 (*)    Albumin 2.5 (*)    All other components within normal limits  CBC WITH DIFFERENTIAL/PLATELET - Abnormal; Notable for the following components:   RDW 15.8 (*)    All other components within normal limits  APTT - Abnormal; Notable for the following components:   aPTT 41 (*)    All other components within normal limits  I-STAT CHEM 8, ED - Abnormal; Notable for the following components:   Potassium 3.4 (*)    BUN 22 (*)    Glucose, Bld 116 (*)    Calcium, Ion 1.13 (*)    All other components within normal limits  I-STAT VENOUS BLOOD GAS, ED - Abnormal; Notable for the following components:   pO2, Ven 138.0 (*)    Potassium 3.3 (*)    All other components  within normal limits  CULTURE, BLOOD (ROUTINE X 2)  CULTURE, BLOOD (ROUTINE X 2)  LACTIC ACID, PLASMA  PROTIME-INR  URINALYSIS, ROUTINE W REFLEX MICROSCOPIC  I-STAT BETA HCG BLOOD, ED ([facility], [facility], [facility] ONLY)  TYPE AND SCREEN    EKG EKG Interpretation  Date/Time:  Sunday February 18 2021 18:12:59 EST Ventricular Rate:  92 PR Interval:  154 QRS Duration: 72 QT Interval:  368 QTC Calculation: 456 R Axis:   -24 Text Interpretation: Sinus rhythm Borderline left axis deviation Anterior infarct, old Confirmed by Pickering, Nathan ([id number]) on 02/18/2021 8:31:42 PM  Radiology DG Chest Port 1 View  Result Date: 02/18/2021 CLINICAL DATA:  Pressure wound on right buttock, possible sepsis EXAM: PORTABLE CHEST 1 VIEW COMPARISON:  01/08/2021 FINDINGS: Mild lower lobe opacities, likely atelectasis. No frank interstitial edema. No pleural effusion or pneumothorax. The heart is normal in size. IMPRESSION: Mild lower lobe atelectasis. Electronically Signed   By: Sriyesh  Krishnan M.D.   On: 02/18/2021 20:13    Procedures Procedures   Medications Ordered in ED Medications  0.9 %  sodium chloride infusion ( Intravenous New Bag/Given 02/18/21 2116)  vancomycin (VANCOREADY) IVPB 1750 mg/350 mL (has no administration in time range)  piperacillin-tazobactam (ZOSYN) IVPB 3.375 g (3.375 g Intravenous New Bag/Given 02/18/21 2216)  norepinephrine (LEVOPHED) 4mg in 250mL (0.016 mg/mL) premix infusion (has no administration in time range)  lactated ringers bolus 1,000 mL (0 mLs Intravenous Stopped 02/18/21 2010)  sodium chloride 0.9 % bolus 1,000 mL (0 mLs Intravenous Stopped 02/18/21 2114)    ED Course  I have reviewed the triage vital signs and the nursing notes.  Pertinent labs & imaging results that were available during my care of the patient were reviewed by me and considered in my medical decision making (see chart for details).    MDM Rules/Calculators/A&P                          50  year old  female presenting for evaluation of a chronic right sacral wound.  She is bedbound from advanced MS. She has chronic osteo of her right sacrum and has completed several rounds of antibiotics.  She has been following up with the wound care team, but notes increased bleeding from the wound over the past several days.  She is now feeling weak and dizzy.  She was mildly hypotensive on arrival to the 90s over 50s.  She was given a fluid bolus.  Exam as above.  She is afebrile with a rectal temp of 98.4 Fahrenheit.  She is having no other infectious symptoms.  Patient typed and screened.  Hemoglobin stable.  She is not tachycardic.  Low concern for hemorrhagic shock.  Sepsis w/u obtained.  Patient given adequate fluid resuscitation but remains hypotensive. She was started on empiric abx and levo gtt. Initial lactic acid elevated to 2.1. CT abd/pelvis pending. Blood cx pending. Critical care team contacted for admission. Hand off given.   Final Clinical Impression(s) / ED Diagnoses Final diagnoses:  Pressure injury of skin of sacral region, unspecified injury stage  Hypotension, unspecified hypotension type    Rx / DC Orders ED Discharge Orders     None          Idamae Lusher, MD 02/18/21 2258    Davonna Belling, MD 02/19/21 1501

## 2021-02-19 ENCOUNTER — Inpatient Hospital Stay (HOSPITAL_COMMUNITY): Payer: Medicare PPO

## 2021-02-19 DIAGNOSIS — R6521 Severe sepsis with septic shock: Secondary | ICD-10-CM

## 2021-02-19 DIAGNOSIS — R6520 Severe sepsis without septic shock: Secondary | ICD-10-CM | POA: Diagnosis not present

## 2021-02-19 DIAGNOSIS — L89159 Pressure ulcer of sacral region, unspecified stage: Secondary | ICD-10-CM

## 2021-02-19 DIAGNOSIS — A419 Sepsis, unspecified organism: Secondary | ICD-10-CM | POA: Diagnosis not present

## 2021-02-19 LAB — BASIC METABOLIC PANEL
Anion gap: 5 (ref 5–15)
BUN: 13 mg/dL (ref 6–20)
CO2: 23 mmol/L (ref 22–32)
Calcium: 8.4 mg/dL — ABNORMAL LOW (ref 8.9–10.3)
Chloride: 110 mmol/L (ref 98–111)
Creatinine, Ser: 0.58 mg/dL (ref 0.44–1.00)
GFR, Estimated: >60 mL/min (ref >60)
Glucose, Bld: 88 mg/dL (ref 70–99)
Potassium: 3.8 mmol/L (ref 3.5–5.1)
Sodium: 138 mmol/L (ref 135–145)

## 2021-02-19 LAB — URINALYSIS, ROUTINE W REFLEX MICROSCOPIC
Bilirubin Urine: NEGATIVE
Glucose, UA: NEGATIVE mg/dL
Ketones, ur: NEGATIVE mg/dL
Nitrite: NEGATIVE
Protein, ur: NEGATIVE mg/dL
Specific Gravity, Urine: 1.01 (ref 1.005–1.030)
pH: 5 (ref 5.0–8.0)

## 2021-02-19 LAB — RESP PANEL BY RT-PCR (FLU A&B, COVID) ARPGX2
Influenza A by PCR: NEGATIVE
Influenza B by PCR: NEGATIVE
SARS Coronavirus 2 by RT PCR: NEGATIVE

## 2021-02-19 LAB — CBC
HCT: 36.8 % (ref 36.0–46.0)
HCT: 41.7 % (ref 36.0–46.0)
Hemoglobin: 11.5 g/dL — ABNORMAL LOW (ref 12.0–15.0)
Hemoglobin: 12.7 g/dL (ref 12.0–15.0)
MCH: 30.4 pg (ref 26.0–34.0)
MCH: 29.5 pg (ref 26.0–34.0)
MCHC: 31.3 g/dL (ref 30.0–36.0)
MCHC: 30.5 g/dL (ref 30.0–36.0)
MCV: 97.4 fL (ref 80.0–100.0)
MCV: 96.8 fL (ref 80.0–100.0)
Platelets: 177 10*3/uL (ref 150–400)
Platelets: 162 10*3/uL (ref 150–400)
RBC: 3.78 MIL/uL — ABNORMAL LOW (ref 3.87–5.11)
RBC: 4.31 MIL/uL (ref 3.87–5.11)
RDW: 15.9 % — ABNORMAL HIGH (ref 11.5–15.5)
RDW: 15.9 % — ABNORMAL HIGH (ref 11.5–15.5)
WBC: 5.6 10*3/uL (ref 4.0–10.5)
WBC: 7.1 10*3/uL (ref 4.0–10.5)
nRBC: 0.4 % — ABNORMAL HIGH (ref 0.0–0.2)
nRBC: 0 % (ref 0.0–0.2)

## 2021-02-19 LAB — CREATININE, SERUM
Creatinine, Ser: 0.77 mg/dL (ref 0.44–1.00)
GFR, Estimated: >60 mL/min (ref >60)

## 2021-02-19 LAB — GLUCOSE, CAPILLARY: Glucose-Capillary: 92 mg/dL (ref 70–99)

## 2021-02-19 LAB — MRSA NEXT GEN BY PCR, NASAL: MRSA by PCR Next Gen: NOT DETECTED

## 2021-02-19 MED ORDER — GABAPENTIN 300 MG PO CAPS: 300.0000 mg | ORAL_CAPSULE | Freq: Three times a day (TID) | ORAL | Status: DC

## 2021-02-19 MED ORDER — JUVEN PO PACK
1.0000 | PACK | Freq: Two times a day (BID) | ORAL | Status: DC
  Administered 2021-02-19 – 2021-02-21 (×4): 1 via ORAL
  Filled 2021-02-19 (×5): qty 1

## 2021-02-19 MED ORDER — ADULT MULTIVITAMIN W/MINERALS CH
1.0000 | ORAL_TABLET | Freq: Every day | ORAL | Status: DC
  Administered 2021-02-19 – 2021-02-22 (×4): 1 via ORAL
  Filled 2021-02-19 (×4): qty 1

## 2021-02-19 MED ORDER — ENSURE ENLIVE PO LIQD
237.0000 mL | Freq: Three times a day (TID) | ORAL | Status: DC
  Administered 2021-02-19 – 2021-02-22 (×7): 237 mL via ORAL

## 2021-02-19 MED ORDER — GABAPENTIN 300 MG PO CAPS: 600.0000 mg | ORAL_CAPSULE | Freq: Every day | ORAL | Status: DC

## 2021-02-19 MED ORDER — TRAMADOL HCL 50 MG PO TABS
50.0000 mg | ORAL_TABLET | Freq: Four times a day (QID) | ORAL | Status: DC | PRN
  Administered 2021-02-19 – 2021-02-22 (×8): 50 mg via ORAL
  Filled 2021-02-19 (×8): qty 1

## 2021-02-19 MED ORDER — ORAL CARE MOUTH RINSE
15.0000 mL | Freq: Two times a day (BID) | OROMUCOSAL | Status: DC
  Administered 2021-02-19 – 2021-02-22 (×7): 15 mL via OROMUCOSAL

## 2021-02-19 MED ORDER — IOHEXOL 300 MG/ML  SOLN
100.0000 mL | Freq: Once | INTRAMUSCULAR | Status: AC | PRN
  Administered 2021-02-19: 100 mL via INTRAVENOUS

## 2021-02-19 MED ORDER — IBUPROFEN 200 MG PO TABS
400.0000 mg | ORAL_TABLET | Freq: Once | ORAL | Status: AC
  Administered 2021-02-19: 400 mg via ORAL
  Filled 2021-02-19: qty 2

## 2021-02-19 MED ORDER — VANCOMYCIN HCL 1250 MG/250ML IV SOLN
1250.0000 mg | INTRAVENOUS | Status: DC
  Administered 2021-02-20: 1250 mg via INTRAVENOUS
  Filled 2021-02-19 (×2): qty 250

## 2021-02-19 MED ORDER — PNEUMOCOCCAL VAC POLYVALENT 25 MCG/0.5ML IJ INJ
0.5000 mL | INJECTION | INTRAMUSCULAR | Status: AC
  Administered 2021-02-20: 0.5 mL via INTRAMUSCULAR
  Filled 2021-02-19: qty 0.5

## 2021-02-19 MED ORDER — CHLORHEXIDINE GLUCONATE CLOTH 2 % EX PADS
6.0000 | MEDICATED_PAD | Freq: Every day | CUTANEOUS | Status: DC
  Administered 2021-02-19 – 2021-02-22 (×4): 6 via TOPICAL

## 2021-02-19 MED ORDER — BACLOFEN 20 MG PO TABS
20.0000 mg | ORAL_TABLET | Freq: Two times a day (BID) | ORAL | Status: DC
  Administered 2021-02-19 – 2021-02-22 (×7): 20 mg via ORAL
  Filled 2021-02-19 (×8): qty 1

## 2021-02-19 MED ORDER — LACTATED RINGERS IV SOLN: INTRAVENOUS | Status: AC

## 2021-02-19 NOTE — Progress Notes (Signed)
Initial Nutrition Assessment  DOCUMENTATION CODES:  Non-severe (moderate) malnutrition in context of chronic illness  INTERVENTION:  Continue regular diet.  Add Ensure Enlive po TID, each supplement provides 350 kcal and 20 grams of protein.  Add 1 packet Juven BID, each packet provides 95 calories, 2.5 grams of protein (collagen), and 9.8 grams of carbohydrate (3 grams sugar); also contains 7 grams of L-arginine and L-glutamine, 300 mg vitamin C, 15 mg vitamin E, 1.2 mcg vitamin B-12, 9.5 mg zinc, 200 mg calcium, and 1.5 g  Calcium Beta-hydroxy-Beta-methylbutyrate to support wound healing.  Add MVI with minerals daily.  Encourage PO and supplement intake.  NUTRITION DIAGNOSIS:  Moderate Malnutrition related to chronic illness (chronic wounds; MS) as evidenced by percent weight loss, mild fat depletion, moderate muscle depletion.  GOAL:  Patient will meet greater than or equal to 90% of their needs  MONITOR:  PO intake, Supplement acceptance, Labs, Weight trends, Skin, I & O's  REASON FOR ASSESSMENT:  Consult Assessment of nutrition requirement/status  ASSESSMENT:  50 yo female with a PMH of pressure ulcer, sacral osteomyelitis, HTN, anxiety, MS, and COVID-19 infection 01/09/21 who presents with sepsis from worsening buttocks wounds.  Spoke with pt at bedside. Pt reports her diet has been keto lately. She had spent the previous year on the "carnivore" diet. She reports that she thought she weighed 190 lbs but weighs in the 140s now. She reports drinking Power Protein shakes (~160 kcal, 30 grams protein per pt).  She is excited regarding this weight loss.  Spoke with pt about how important calories are with sepsis. Pt understanding and willing to take Ensure.  Per Epic, pt ate 75% of her breakfast this morning.  Per Epic, pt has lost ~17.5 lbs (10.5%) in the past 1.5 months, which is significant and severe for the time frame.  Pt reports that she can only move her toes at the  moment due to her MS, causing an increased risk for loss in legs due to limited mobility.  Recommend Ensure TID, Juven BID, and MVI with minerals daily.  Medications: reviewed; Protonix, LR @ 75 ml/hr, Levophed  Labs: reviewed; CBG 92  NUTRITION - FOCUSED PHYSICAL EXAM: Flowsheet Row Most Recent Value  Orbital Region Mild depletion  Upper Arm Region Mild depletion  Thoracic and Lumbar Region No depletion  Buccal Region Mild depletion  Temple Region Mild depletion  Clavicle Bone Region Moderate depletion  Clavicle and Acromion Bone Region Severe depletion  Scapular Bone Region Unable to assess  Dorsal Hand Moderate depletion  Patellar Region No depletion  Anterior Thigh Region No depletion  Posterior Calf Region No depletion  Edema (RD Assessment) None  Hair Reviewed  Eyes Reviewed  Mouth Reviewed  Skin Reviewed  Nails Reviewed   Diet Order:   Diet Order             Diet regular Room service appropriate? Yes; Fluid consistency: Thin  Diet effective now                  EDUCATION NEEDS:  Education needs have been addressed  Skin:  Skin Assessment: Skin Integrity Issues: Skin Integrity Issues:: Stage II, Stage IV Stage II: L buttocks Stage IV: R buttocks  Last BM:  02/17/21  Height:  Ht Readings from Last 1 Encounters:  02/19/21 5\' 2"  (1.575 m)   Weight:  Wt Readings from Last 1 Encounters:  02/19/21 67.9 kg   BMI:  Body mass index is 27.38 kg/m.  Estimated Nutritional Needs:  Kcal:  2100-2300 Protein:  85-100 grams Fluid:  >2.1 L  Vertell Limber, RD, LDN (she/her/hers) Clinical Inpatient Dietitian RD Pager/After-Hours/Weekend Pager # in [place]

## 2021-02-19 NOTE — Progress Notes (Signed)
NAME:  [name], MRN:  [id number], DOB:  06/26/1970, LOS: 1 ADMISSION DATE:  02/18/2021, CONSULTATION DATE:  02/18/21 REFERRING MD:  Rubin Payor, CHIEF COMPLAINT:   Pain in R buttock  History of Present Illness:   [name] is a 50 yo woman with a history of MS (bed bound) Here with worsening wound on R buttock, bleeding.  Hx of Chronic osteomyelitis in R sacrum.   Desatted to 70s while sleeping, ok on 2L [place]  IN ED given zosyn, vanc ordered 2L fluid boluses given  CXR Mild Lower lobe opacities.   Lives with Mom and Dad, primary caregiver is her mom right now, her father has medical issues.  Pertinent  Medical History   Pressure ulcer Sacral osteomyelitis HTN Anxiety MS Former smoker (40 pack years) Covid 01/09/21  Significant Hospital Events: Including procedures, antibiotic start and stop dates in addition to other pertinent events   12/11 admitted w/ septic shock d/t infected sacral wound.  Vancomycin and Zosyn initiated required norepinephrine 12/12 CT abdomen pelvis showed deep right gluteal ulcer extending to the ischial tuberosity with associated osteomyelitis no associated abscess, this is also noted in November 2022 MRI there was cirrhosis with.  Gastric varices. Off pressors wound team consulted.   Interim History / Subjective:  Wants to eat.  Currently off pressors Complains of pain  Objective   Blood pressure 93/63, pulse 75, temperature 98.5 F (36.9 C), temperature source Oral, resp. rate 16, height 5\' 2"  (1.575 m), weight 67.9 kg, SpO2 100 %.        Intake/Output Summary (Last 24 hours) at 02/19/2021 0848 Last data filed at 02/19/2021 0800 Gross per 24 hour  Intake 822.48 ml  Output 400 ml  Net 422.48 ml   Filed Weights   02/18/21 1843 02/19/21 0300  Weight: 86.2 kg 67.9 kg    Examination: General chronically ill-appearing 50 year old female resting in bed in no acute distress but does have some discomfort HEENT normocephalic  atraumatic Pulmonary clear to auscultation Cardiac regular rate and rhythm Abdomen soft nontender Extremities warm dry no significant edema bilateral foot drop Neuro awake oriented cannot move lower extremities Sacral wound not assessed on this evaluation Resolved Hospital Problem list     Assessment & Plan:  Septic shock secondary to infected chronic right sacral wound now with deep gluteal ulcer extending to the ischial tuberosity complicated further by osteomyelitis (chronic with ischial tuberosity involvement dating back to November 2022)  -Culture still pending Plan Continue norepinephrine for systolic blood pressure greater than 90 Continue IV hydration Will continue IV vancomycin and Zosyn currently day #2 Will need wound ostomy evaluation, has previously been seen by plastics, not sure there would be any new utility in involving them at this point but will wait for wound team to weigh in, she was supposed to go to the wound clinic in January  As needed analgesia  AKI: Cr actually elevated from 0.47 in 10/22 to 0.9 now.  Monitor UOP and Cr Closely.   Plan Continue to hold gabapentin until creatinine improves Follow-up serum creatinine today Avoid hypotension Renal dose medications Strict intake output.  Fluid and electrolyte imbalance: Hypokalemia Plan This was replaced we will recheck  MS - bed bound at baseline Plan Continue baclofen   Best Practice (right click and "Reselect all SmartList Selections" daily)   Diet/type: Regular consistency (see orders) DVT prophylaxis: prophylactic heparin  GI prophylaxis: PPI Lines: N/A Foley:  N/A Code Status:  full code Last date of multidisciplinary goals  of care discussion []   Critical care time: 32 min      ACNP-BC [facility] Pulmonary/Critical Care Pager # [phone] OR # [phone] if no answer

## 2021-02-19 NOTE — Progress Notes (Addendum)
eLink Physician-Brief Progress Note Patient Name: [name] DOB: [birth year] MRN: [id number]   Date of Service  02/19/2021  HPI/Events of Note  1F bedbound from MS admitted for sepsis secondary to chronic right sacral wound. S/p 1L LR in the ED. Started on low dose vasopressors and antibiotics.  CT A/P with deep right gluteal ulcer extending to ischial tuberosity with OM. No abscess CXR with bibasilar atelectasis  WBC 7.1 K 3.4 LA cleared  Camera check: Chronically ill appearing female. AAOx4. On 2L O2. On levophed 2 and MIV@75cc   eICU Interventions  Continue antibiotics. F/u blood culture Wean vasopressors for MAP goal >60-65  4:04 AM Patient requesting home meds: baclofen (reduced), gabapentin (reduced) and ibuprofen (one time dose)     Intervention Category Evaluation Type: New Patient Evaluation  [name] 02/19/2021, 2:22 AM

## 2021-02-19 NOTE — Sepsis Progress Note (Signed)
Following for sepsis monitoring ?

## 2021-02-19 NOTE — ED Notes (Signed)
Pt returned from CT with this RN, pt placed on monitor

## 2021-02-19 NOTE — Plan of Care (Signed)
  Problem: Clinical Measurements: Goal: Ability to maintain clinical measurements within normal limits will improve Outcome: Progressing Goal: Will remain free from infection Outcome: Progressing Goal: Diagnostic test results will improve Outcome: Progressing Goal: Respiratory complications will improve Outcome: Progressing Goal: Cardiovascular complication will be avoided Outcome: Progressing   Problem: Activity: Goal: Risk for activity intolerance will decrease Outcome: Not Progressing   Problem: Nutrition: Goal: Adequate nutrition will be maintained Outcome: Not Progressing   Problem: Coping: Goal: Level of anxiety will decrease Outcome: Progressing   Problem: Elimination: Goal: Will not experience complications related to bowel motility Outcome: Not Progressing Goal: Will not experience complications related to urinary retention Outcome: Progressing   Problem: Pain Managment: Goal: General experience of comfort will improve Outcome: Progressing   Problem: Safety: Goal: Ability to remain free from injury will improve Outcome: Progressing   Problem: Skin Integrity: Goal: Risk for impaired skin integrity will decrease Outcome: Progressing

## 2021-02-19 NOTE — Consult Note (Signed)
WOC Nurse wound consult note Consultation was completed by review of records, images and assistance from the bedside nurse/clinical staff.  Reason for Consult: pressure injury Patient known to our team from previous admission. Cared for at home by mother and [facility]. Reports increasing in bleeding over the last few weeks.  Wound type: Stage 4 Pressure Injury; right ischium; Moisture Associated Skin Damage (MASD) left buttock Pressure Injury POA: Yes Measurement: Right ischium: 3cm x 2cm x 1.5, tunneling at 7 o'clock (4.5cm ) Wound bed:100% pink, moist  Drainage (amount, consistency, odor) sanguinous, no odor Periwound: intact Dressing procedure/placement/frequency:  Pack right ischial wound with calcium alginate for absorption and hemostatic properties. Cover with dry dressing Low air loss mattress in place while in the ICU, will need air mattress if the patient transfers.   Monitor for increased bleeding, notify CCM if appear to be actively bleeding more  May need general surgery or CCM to cauterize base of wound with silver nitrate or otherwise.    Discussed POC with patient and bedside nurse.  Re consult if needed, will not follow at this time. Thanks  Harla Mensch [employer], RN,CWOCN, CNS, CWON-AP [phone])

## 2021-02-19 NOTE — ED Notes (Signed)
Pt transferred to CT with this RN

## 2021-02-19 NOTE — Progress Notes (Signed)
Pharmacy Antibiotic Note  [name] is a 50 y.o. female admitted on 02/18/2021 with sepsis.  Pharmacy has been consulted for vancomycin dosing.  Plan: Vancomycin 1750mg  x1 then 1250mg  IV Q24H. Goal AUC 400-550.  Expected AUC 500.  SCr used 0.8 (actual 0.77)   Height: 5\' 2"  (157.5 cm) Weight: 86.2 kg (190 lb) IBW/kg (Calculated) : 50.1  Temp (24hrs), Avg:98.1 F (36.7 C), Min:97.7 F (36.5 C), Max:98.4 F (36.9 C)  Recent Labs  Lab 02/18/21 1844 02/18/21 1856 02/18/21 2029 02/18/21 2302  WBC 7.1  --   --  5.6  CREATININE 0.76 0.90  --  0.77  LATICACIDVEN 2.1*  --  1.9  --     Estimated Creatinine Clearance: 83.8 mL/min (by C-G formula based on SCr of 0.77 mg/dL).    Allergies  Allergen Reactions   Silver    Tizanidine Diarrhea   Tomato     Thank you for allowing pharmacy to be a part of this patient's care.  14/11/22, PharmD, BCPS  02/19/2021 1:43 AM

## 2021-02-19 NOTE — Consult Note (Signed)
Consult Note  [name] October 12, 1970  [id number].    Requesting MD: Anders Simmonds, NP Chief Complaint/Reason for Consult: R ischial wound  HPI:  Patient is a 50 year old female with MS and is bed-bound who states she developed this R ischial wound some time between her hospital stay in September and October.  She has a HH wound nurse coming out to her house a couple of times a week.  They are placing calcium alginate in the wound every 2-3 days.  She had an MRI of this area back in October that revealed adjacent osteomyelitis of the bone.  She was treated with Augmentin and Doxycycline for a couple of weeks for this in October.    She returns to the ED yesterday complaining of increase in bleeding from this wound.  She is adamant that it is bleeding a fair amount although her description seems more consistent with the typical drainage that all wounds have which is serosanguineous in nature along with some expected blood oozing which is also expected from beefy granular tissue.  We have been asked to see the wound for surgical evaluation.  ROS: ROS: Please see HPI, otherwise all other systems are currently negative.  History reviewed. No pertinent family history.  Past Medical History:  Diagnosis Date   Arthritis    MS (multiple sclerosis) (HCC)     Past Surgical History:  Procedure Laterality Date   NO PAST SURGERIES     ORIF ANKLE FRACTURE Left 03/30/2013   Procedure: OPEN REDUCTION INTERNAL FIXATION (ORIF) LEFT ANKLE FRACTURE;  Surgeon: Toni Arthurs, MD;  Location: [facility] OR;  Service: Orthopedics;  Laterality: Left;    Social History:  reports that she quit smoking about 12 years ago. She has a 40.00 pack-year smoking history. She quit smokeless tobacco use about 2 years ago. She reports that she does not drink alcohol and does not use drugs.  Allergies:  Allergies  Allergen Reactions   Silver    Tizanidine Diarrhea   Tomato     Medications Prior to Admission   Medication Sig Dispense Refill   acetaminophen (TYLENOL) 325 MG tablet Take 2 tablets (650 mg total) by mouth every 6 (six) hours as needed for mild pain (or Fever >/= 101). 30 tablet 1   baclofen (LIORESAL) 20 MG tablet Take 20 mg by mouth 3 (three) times daily.     carvedilol (COREG) 3.125 MG tablet Take 3.125 mg by mouth 2 (two) times daily with a meal.     diclofenac Sodium (VOLTAREN) 1 % GEL Apply 4 g topically 4 (four) times daily. Apply to joint or muscle pains (Patient taking differently: Apply 4 g topically 4 (four) times daily as needed (joint or muscle pain).) 350 g 0   gabapentin (NEURONTIN) 300 MG capsule Take 300-600 mg by mouth See admin instructions. 300 mg  morning, noon and dinner and 2 capsules (600mg ) at bedtime.     ibuprofen (ADVIL) 200 MG tablet Take 400 mg by mouth every 6 (six) hours as needed for headache or moderate pain.     meloxicam (MOBIC) 7.5 MG tablet Take 7.5 mg by mouth daily.     Multiple Vitamin (MULTIVITAMIN) capsule Take 1 capsule by mouth daily.     polyethylene glycol (MIRALAX / GLYCOLAX) 17 g packet Take 17 g by mouth 2 (two) times daily as needed for moderate constipation or mild constipation. 14 each 0   protein supplement shake (PREMIER PROTEIN) LIQD Take 2 oz  by mouth daily. chocolate     vitamin C (ASCORBIC ACID) 500 MG tablet Take 500 mg by mouth daily.     Zinc Oxide (TRIPLE PASTE) 12.8 % ointment Apply topically 2 (two) times daily. Apply AROUND sacral wound area. Then follow Dakin's order. (Patient taking differently: Apply topically as needed for irritation.) 57 g 0   feeding supplement, ENSURE ENLIVE, (ENSURE ENLIVE) LIQD Take 237 mLs by mouth 2 (two) times daily between meals. (Patient not taking: Reported on 11/12/2020) 237 mL 12   senna-docusate (SENOKOT-S) 8.6-50 MG tablet Take 2 tablets by mouth 2 (two) times daily as needed for mild constipation. (Patient not taking: Reported on 02/19/2021) 60 tablet 3    Blood pressure (!) 97/54, pulse 87,  temperature 97.9 F (36.6 C), temperature source Oral, resp. rate 15, height 5\' 2"  (1.575 m), weight 67.9 kg, SpO2 99 %. Physical Exam:  General: pleasant, WD, WN female who is laying in bed in NAD HEENT: head is normocephalic, atraumatic.  Sclera are noninjected.  PERRL.  Ears and nose without any masses or lesions.  Mouth is pink and moist with no teeth present MS: all 4 extremities are symmetrical with no cyanosis, clubbing, or edema.  Some contractures of the LEs, but moves upper extremities with no difficulty. Skin: warm and dry with no masses, lesions, or rashes.  She has a stage IV R ischial wound that is 100% clean with beefy red granulation tissue.  No active bleeding is noted during our evaluation.  She has some expected serosang drainage noted on the dressing when removed.  Psych: A&Ox3 with an appropriate affect.   Results for orders placed or performed during the hospital encounter of 02/18/21 (from the past 48 hour(s))  Urinalysis, Routine w reflex microscopic     Status: Abnormal   Collection Time: 02/18/21 12:25 AM  Result Value Ref Range   Color, Urine YELLOW YELLOW   APPearance HAZY (A) CLEAR   Specific Gravity, Urine 1.010 1.005 - 1.030   pH 5.0 5.0 - 8.0   Glucose, UA NEGATIVE NEGATIVE mg/dL   Hgb urine dipstick MODERATE (A) NEGATIVE   Bilirubin Urine NEGATIVE NEGATIVE   Ketones, ur NEGATIVE NEGATIVE mg/dL   Protein, ur NEGATIVE NEGATIVE mg/dL   Nitrite NEGATIVE NEGATIVE   Leukocytes,Ua LARGE (A) NEGATIVE   RBC / HPF 0-5 0 - 5 RBC/hpf   WBC, UA 21-50 0 - 5 WBC/hpf   Bacteria, UA FEW (A) NONE SEEN   Squamous Epithelial / LPF 0-5 0 - 5    Comment: Performed at [facility], [address] [place], [place] [address]  Blood Culture (routine x 2)     Status: None (Preliminary result)   Collection Time: 02/18/21  6:38 PM   Specimen: BLOOD  Result Value Ref Range   Specimen Description BLOOD RIGHT ANTECUBITAL    Special Requests      BOTTLES DRAWN AEROBIC AND  ANAEROBIC Blood Culture results may not be optimal due to an inadequate volume of blood received in culture bottles   Culture      NO GROWTH < 24 HOURS Performed at [facility] [address] [place], [place] [address]    Report Status PENDING   Lactic acid, plasma     Status: Abnormal   Collection Time: 02/18/21  6:44 PM  Result Value Ref Range   Lactic Acid, Venous 2.1 (HH) 0.5 - 1.9 mmol/L    Comment: CRITICAL RESULT CALLED TO, READ BACK BY AND VERIFIED  WITH: Janalyn Harder RN 02/18/21 2054 Wiliam Ke Performed at [facility], [place] [address], [place], [place] [address]   Comprehensive metabolic panel     Status: Abnormal   Collection Time: 02/18/21  6:44 PM  Result Value Ref Range   Sodium 135 135 - 145 mmol/L   Potassium 3.4 (L) 3.5 - 5.1 mmol/L   Chloride 103 98 - 111 mmol/L   CO2 24 22 - 32 mmol/L   Glucose, Bld 115 (H) 70 - 99 mg/dL    Comment: Glucose reference range applies only to samples taken after fasting for at least 8 hours.   BUN 20 6 - 20 mg/dL   Creatinine, Ser 0.76 0.44 - 1.00 mg/dL   Calcium 8.8 (L) 8.9 - 10.3 mg/dL   Total Protein 6.6 6.5 - 8.1 g/dL   Albumin 2.5 (L) 3.5 - 5.0 g/dL   AST 26 15 - 41 U/L   ALT 9 0 - 44 U/L   Alkaline Phosphatase 89 38 - 126 U/L   Total Bilirubin 0.4 0.3 - 1.2 mg/dL   GFR, Estimated >60 >60 mL/min    Comment: (NOTE) Calculated using the CKD-EPI Creatinine Equation (2021)    Anion gap 8 5 - 15    Comment: Performed at [facility], [place] [address], [place], [place] [address]  CBC WITH DIFFERENTIAL     Status: Abnormal   Collection Time: 02/18/21  6:44 PM  Result Value Ref Range   WBC 7.1 4.0 - 10.5 K/uL   RBC 4.62 3.87 - 5.11 MIL/uL   Hemoglobin 14.0 12.0 - 15.0 g/dL   HCT 44.2 36.0 - 46.0 %   MCV 95.7 80.0 - 100.0 fL   MCH 30.3 26.0 - 34.0 pg   MCHC 31.7 30.0 - 36.0 g/dL   RDW 15.8 (H) 11.5 - 15.5 %   Platelets 224 150 - 400 K/uL   nRBC 0.0 0.0 - 0.2 %   Neutrophils Relative % 49 %   Neutro  Abs 3.4 1.7 - 7.7 K/uL   Lymphocytes Relative 39 %   Lymphs Abs 2.8 0.7 - 4.0 K/uL   Monocytes Relative 9 %   Monocytes Absolute 0.7 0.1 - 1.0 K/uL   Eosinophils Relative 3 %   Eosinophils Absolute 0.2 0.0 - 0.5 K/uL   Basophils Relative 0 %   Basophils Absolute 0.0 0.0 - 0.1 K/uL   Immature Granulocytes 0 %   Abs Immature Granulocytes 0.02 0.00 - 0.07 K/uL    Comment: Performed at [facility] [address], [place], [place] [address]  Protime-INR     Status: None   Collection Time: 02/18/21  6:44 PM  Result Value Ref Range   Prothrombin Time 13.8 11.4 - 15.2 seconds   INR 1.1 0.8 - 1.2    Comment: (NOTE) INR goal varies based on device and disease states. Performed at [facility], [place] [address], [place],  [address]   APTT     Status: Abnormal   Collection Time: 02/18/21  6:44 PM  Result Value Ref Range   aPTT 41 (H) 24 - 36 seconds    Comment:        IF BASELINE aPTT IS ELEVATED, SUGGEST PATIENT RISK ASSESSMENT BE USED TO DETERMINE APPROPRIATE ANTICOAGULANT THERAPY. Performed at [facility], [place] [address], [place],  [address]   I-Stat Chem 8, ED     Status: Abnormal   Collection Time: 02/18/21  6:56 PM  Result Value Ref Range   Sodium 139 135 - 145 mmol/L   Potassium 3.4 (L) 3.5 - 5.1 mmol/L   Chloride 101 98 - 111 mmol/L   BUN 22 (H) 6 - 20 mg/dL   Creatinine, Ser 0.90 0.44 - 1.00 mg/dL   Glucose, Bld 116 (H) 70 - 99 mg/dL    Comment: Glucose reference range applies only to samples taken after fasting for at least 8 hours.   Calcium, Ion 1.13 (L) 1.15 - 1.40 mmol/L   TCO2 26 22 - 32 mmol/L   Hemoglobin 14.3 12.0 - 15.0 g/dL   HCT 42.0 36.0 - 46.0 %  I-Stat beta hCG blood, ED     Status: None   Collection Time: 02/18/21  7:04 PM  Result Value Ref Range   I-stat hCG, quantitative <5.0 <5 mIU/mL   Comment 3            Comment:   GEST. AGE      CONC.  (mIU/mL)   <=1 WEEK        5 - 50     2 WEEKS       50 - 500     3 WEEKS        100 - 10,000     4 WEEKS     1,000 - 30,000        FEMALE AND NON-PREGNANT FEMALE:     LESS THAN 5 mIU/mL   Type and screen [place]     Status: None   Collection Time: 02/18/21  7:04 PM  Result Value Ref Range   ABO/RH(D) A POS    Antibody Screen NEG    Sample Expiration      02/21/2021,2359 Performed at [facility], [place] [address], [place], [place] [address]   Blood Culture (routine x 2)     Status: None (Preliminary result)   Collection Time: 02/18/21  8:06 PM   Specimen: BLOOD LEFT HAND  Result Value Ref Range   Specimen Description BLOOD LEFT HAND    Special Requests      BOTTLES DRAWN AEROBIC AND ANAEROBIC Blood Culture results may not be optimal due to an inadequate volume of blood received in culture bottles   Culture      NO GROWTH < 12 HOURS Performed at [facility] [address], [place], [place] [address]    Report Status PENDING   Lactic acid, plasma     Status: None   Collection Time: 02/18/21  8:29 PM  Result Value Ref Range   Lactic Acid, Venous 1.9 0.5 - 1.9 mmol/L    Comment: Performed at [facility] [address], [place], [place] [address]  I-Stat venous blood gas, ED     Status: Abnormal   Collection Time: 02/18/21  9:59 PM  Result Value Ref Range   pH, Ven 7.355 7.250 - 7.430   pCO2, Ven 47.0 44.0 - 60.0 mmHg   pO2, Ven 138.0 (H) 32.0 - 45.0 mmHg   Bicarbonate 26.2 20.0 - 28.0 mmol/L   TCO2 28 22 - 32 mmol/L   O2 Saturation 99.0 %   Acid-Base Excess 0.0 0.0 - 2.0 mmol/L   Sodium 138 135 - 145 mmol/L   Potassium 3.3 (L) 3.5 - 5.1 mmol/L   Calcium, Ion 1.17 1.15 - 1.40 mmol/L   HCT 41.0 36.0 - 46.0 %   Hemoglobin 13.9 12.0 - 15.0 g/dL   Sample type VENOUS  CBC     Status: Abnormal   Collection Time: 02/18/21 11:02 PM  Result Value Ref Range   WBC 5.6 4.0 - 10.5 K/uL   RBC 3.78 (L) 3.87 - 5.11 MIL/uL   Hemoglobin 11.5 (L) 12.0 - 15.0 g/dL   HCT 36.8 36.0 - 46.0 %   MCV 97.4 80.0 - 100.0 fL   MCH  30.4 26.0 - 34.0 pg   MCHC 31.3 30.0 - 36.0 g/dL   RDW 15.9 (H) 11.5 - 15.5 %   Platelets 177 150 - 400 K/uL   nRBC 0.4 (H) 0.0 - 0.2 %    Comment: Performed at [facility] [address], [place], [place] [address]  Creatinine, serum     Status: None   Collection Time: 02/18/21 11:02 PM  Result Value Ref Range   Creatinine, Ser 0.77 0.44 - 1.00 mg/dL   GFR, Estimated >60 >60 mL/min    Comment: (NOTE) Calculated using the CKD-EPI Creatinine Equation (2021) Performed at [facility] [address] [place], [place] [address]   I-Stat venous blood gas, ED     Status: Abnormal   Collection Time: 02/18/21 11:36 PM  Result Value Ref Range   pH, Ven 7.397 7.250 - 7.430   pCO2, Ven 40.6 (L) 44.0 - 60.0 mmHg   pO2, Ven 53.0 (H) 32.0 - 45.0 mmHg   Bicarbonate 25.0 20.0 - 28.0 mmol/L   TCO2 26 22 - 32 mmol/L   O2 Saturation 87.0 %   Acid-Base Excess 0.0 0.0 - 2.0 mmol/L   Sodium 141 135 - 145 mmol/L   Potassium 3.6 3.5 - 5.1 mmol/L   Calcium, Ion 1.13 (L) 1.15 - 1.40 mmol/L   HCT 34.0 (L) 36.0 - 46.0 %   Hemoglobin 11.6 (L) 12.0 - 15.0 g/dL   Sample type VENOUS   Resp Panel by RT-PCR (Flu A&B, Covid) Nasopharyngeal Swab     Status: None   Collection Time: 02/19/21  1:42 AM   Specimen: Nasopharyngeal Swab; Nasopharyngeal(NP) swabs in vial transport medium  Result Value Ref Range   SARS Coronavirus 2 by RT PCR NEGATIVE NEGATIVE    Comment: (NOTE) SARS-CoV-2 target nucleic acids are NOT DETECTED.  The SARS-CoV-2 RNA is generally detectable in upper respiratory specimens during the acute phase of infection. The lowest concentration of SARS-CoV-2 viral copies this assay can detect is 138 copies/mL. A negative result does not preclude SARS-Cov-2 infection and should not be used as the sole basis for treatment or other patient management decisions. A negative result may occur with  improper specimen collection/handling, submission of specimen other than nasopharyngeal  swab, presence of viral mutation(s) within the areas targeted by this assay, and inadequate number of viral copies(<138 copies/mL). A negative result must be combined with clinical observations, patient history, and epidemiological information. The expected result is Negative.  Fact Sheet for Patients:  EntrepreneurPulse.com.au  Fact Sheet for Healthcare Providers:  IncredibleEmployment.be  This test is no t yet approved or cleared by the [place] FDA and  has been authorized for detection and/or diagnosis of SARS-CoV-2 by FDA under an Emergency Use Authorization (EUA). This EUA will remain  in effect (meaning this test can be used) for the duration of the COVID-19 declaration under Section 564(b)(1) of the Act, 21 U.S.C.section 360bbb-3(b)(1), unless the authorization is terminated  or revoked sooner.       Influenza A by PCR NEGATIVE NEGATIVE   Influenza B by PCR NEGATIVE NEGATIVE    Comment: (  NOTE) The Xpert Xpress SARS-CoV-2/FLU/RSV plus assay is intended as an aid in the diagnosis of influenza from Nasopharyngeal swab specimens and should not be used as a sole basis for treatment. Nasal washings and aspirates are unacceptable for Xpert Xpress SARS-CoV-2/FLU/RSV testing.  Fact Sheet for Patients: EntrepreneurPulse.com.au  Fact Sheet for Healthcare Providers: IncredibleEmployment.be  This test is not yet approved or cleared by the [place] FDA and has been authorized for detection and/or diagnosis of SARS-CoV-2 by FDA under an Emergency Use Authorization (EUA). This EUA will remain in effect (meaning this test can be used) for the duration of the COVID-19 declaration under Section 564(b)(1) of the Act, 21 U.S.C. section 360bbb-3(b)(1), unless the authorization is terminated or revoked.  Performed at [facility], [place] [address], [place], [place] [address]   Glucose, capillary      Status: None   Collection Time: 02/19/21  2:31 AM  Result Value Ref Range   Glucose-Capillary 92 70 - 99 mg/dL    Comment: Glucose reference range applies only to samples taken after fasting for at least 8 hours.  MRSA Next Gen by PCR, Nasal     Status: None   Collection Time: 02/19/21  2:51 AM   Specimen: Nasal Mucosa; Nasal Swab  Result Value Ref Range   MRSA by PCR Next Gen NOT DETECTED NOT DETECTED    Comment: (NOTE) The GeneXpert MRSA Assay (FDA approved for NASAL specimens only), is one component of a comprehensive MRSA colonization surveillance program. It is not intended to diagnose MRSA infection nor to guide or monitor treatment for MRSA infections. Test performance is not FDA approved in patients less than 71 years old. Performed at [facility], [place] [address], [place], [place] [address]   CBC     Status: Abnormal   Collection Time: 02/19/21  5:58 AM  Result Value Ref Range   WBC 7.1 4.0 - 10.5 K/uL   RBC 4.31 3.87 - 5.11 MIL/uL   Hemoglobin 12.7 12.0 - 15.0 g/dL   HCT 41.7 36.0 - 46.0 %   MCV 96.8 80.0 - 100.0 fL   MCH 29.5 26.0 - 34.0 pg   MCHC 30.5 30.0 - 36.0 g/dL   RDW 15.9 (H) 11.5 - 15.5 %   Platelets 162 150 - 400 K/uL   nRBC 0.0 0.0 - 0.2 %    Comment: Performed at [facility], [place] [address] [place], [place] [id number]  Basic metabolic panel     Status: Abnormal   Collection Time: 02/19/21  9:46 AM  Result Value Ref Range   Sodium 138 135 - 145 mmol/L   Potassium 3.8 3.5 - 5.1 mmol/L   Chloride 110 98 - 111 mmol/L   CO2 23 22 - 32 mmol/L   Glucose, Bld 88 70 - 99 mg/dL    Comment: Glucose reference range applies only to samples taken after fasting for at least 8 hours.   BUN 13 6 - 20 mg/dL   Creatinine, Ser 0.58 0.44 - 1.00 mg/dL   Calcium 8.4 (L) 8.9 - 10.3 mg/dL   GFR, Estimated >60 >60 mL/min    Comment: (NOTE) Calculated using the CKD-EPI Creatinine Equation (2021)    Anion gap 5 5 - 15    Comment: Performed at [facility] [address], [place],  [address]   CT ABDOMEN PELVIS W CONTRAST  Result Date: 02/19/2021 CLINICAL DATA:  Abdominal pain, pressure wound on right buttock EXAM: CT ABDOMEN  AND PELVIS WITH CONTRAST TECHNIQUE: Multidetector CT imaging of the abdomen and pelvis was performed using the standard protocol following bolus administration of intravenous contrast. CONTRAST:  139mL OMNIPAQUE IOHEXOL 300 MG/ML  SOLN COMPARISON:  MRI pelvis dated 12/12/2020. CT abdomen/pelvis dated 11/12/2020. FINDINGS: Lower chest: Lung bases are clear. Hepatobiliary: Macronodular hepatic contour, suggesting cirrhosis. No focal hepatic lesion is seen. Gallbladder is unremarkable. No intrahepatic or extrahepatic ductal dilatation. Pancreas: Within normal limits. Spleen: Lobular spleen, although within the upper limits of normal for size. Adrenals/Urinary Tract: Adrenal glands are within normal limits. 2.2 cm right lower pole renal cyst (series 4/image 33), benign. 3 mm nonobstructing left lower pole renal calculus (series 4/image 33). No hydronephrosis. Bladder is within normal limits. Stomach/Bowel: Stomach is within normal limits. No evidence of bowel obstruction. Normal appendix (series 4/image 53). Mild to moderate rectal stool burden. Vascular/Lymphatic: No evidence of abdominal aortic aneurysm. Atherosclerotic calcifications of the abdominal aorta and branch vessels. Perigastric varices.  Portal vein is patent. Reproductive: Uterus is within normal limits. No adnexal masses. Other: No abdominopelvic ascites. Musculoskeletal: Moderate degenerative changes of the visualized thoracolumbar spine. Deep right gluteal ulcer extending to the ischial tuberosity (series 4/image 86), with associated cortical destruction reflecting osteomyelitis (series 4/image 83), better evaluated on prior MRI. No associated abscess. IMPRESSION: Deep right gluteal ulcer extending to the ischial tuberosity with associated osteomyelitis, better  evaluated on prior MRI. No associated abscess. Cirrhosis. Perigastric varices. Portal vein is patent. Spleen is within the upper limits of normal for size. No abdominal ascites. Additional ancillary findings as above. Electronically Signed   By: Julian Hy M.D.   On: 02/19/2021 01:34   DG Chest Port 1 View  Result Date: 02/18/2021 CLINICAL DATA:  Pressure wound on right buttock, possible sepsis EXAM: PORTABLE CHEST 1 VIEW COMPARISON:  01/08/2021 FINDINGS: Mild lower lobe opacities, likely atelectasis. No frank interstitial edema. No pleural effusion or pneumothorax. The heart is normal in size. IMPRESSION: Mild lower lobe atelectasis. Electronically Signed   By: Julian Hy M.D.   On: 02/18/2021 20:13      Assessment/Plan Chronic Stage IV right ischial wound secondary to chronic osteomyelitis  The patient's wound is 100% clean with no current signs of bleeding.  She does have expected serosanguineous type drainage on her dressing.  It is unclear if she is getting this confused with straight blood, but it adamant that her wound is bleeding.  We did reassure her that it is normal for a healthy wound to bleed some, especially during dressing changes and that is a good sign.  We tried to reassure her that everything looked clean and healthy.  There are no acute surgical needs for this wound.  Let us know if something changes.  Discussed with primary service.  Agree with follow up with wound care center.  FEN: reg diet, SLIV VTE: SQH ID: [id number] 12/11>>  MS, bedbound AKI   Henreitta Cea, [facility] Surgery 02/19/2021, 2:27 PM Please see Amion for pager number during day hours 7:00am-4:30pm

## 2021-02-19 NOTE — Progress Notes (Signed)
Pt transferred to 5W02 without event. Pt's new primary RN at bedside on arrival. Pt alert and comfortable. Switched to 5W's telemetry by primary RN.

## 2021-02-19 NOTE — ED Notes (Signed)
Urine culture sent with urinalysis.  

## 2021-02-19 NOTE — Discharge Instructions (Signed)
Nutrition Post Hospital Stay Proper nutrition can help your body recover from illness and injury.   Foods and beverages high in protein, vitamins, and minerals help rebuild muscle loss, promote healing, & reduce fall risk.   .In addition to eating healthy foods, a nutrition shake is an easy, delicious way to get the nutrition you need during and after your hospital stay  It is recommended that you continue to drink 2-3 bottles per day of:       Ensure for at least 1 month (30 days) after your hospital stay   Tips for adding a nutrition shake into your routine: As allowed, drink one with vitamins or medications instead of water or juice Enjoy one as a tasty mid-morning or afternoon snack Drink cold or make a milkshake out of it Drink one instead of milk with cereal or snacks Use as a coffee creamer   Available at the following grocery stores and pharmacies:           * [employer] * [employer] * [employer]  * R[employer] Aid          * [employer] * [employer]  * Walgreens      * Target  * BJ's   * [employer]S  * [employer]   * [facility] [phone]            For COUPONS visit: [email or web address]/join or [email or web address]/[email or web address]   Suggested Substitutions Ensure Plus = Boost Plus = Carnation Breakfast Essentials = Boost Compact Ensure Active Clear = Boost Breeze Glucerna Shake = Boost Glucose Control = Carnation Breakfast Essentials SUGAR FREE     

## 2021-02-20 DIAGNOSIS — E440 Moderate protein-calorie malnutrition: Secondary | ICD-10-CM | POA: Insufficient documentation

## 2021-02-20 LAB — BASIC METABOLIC PANEL
Anion gap: 8 (ref 5–15)
BUN: 19 mg/dL (ref 6–20)
CO2: 23 mmol/L (ref 22–32)
Calcium: 8.7 mg/dL — ABNORMAL LOW (ref 8.9–10.3)
Chloride: 104 mmol/L (ref 98–111)
Creatinine, Ser: 0.63 mg/dL (ref 0.44–1.00)
GFR, Estimated: >60 mL/min (ref >60)
Glucose, Bld: 111 mg/dL — ABNORMAL HIGH (ref 70–99)
Potassium: 3.9 mmol/L (ref 3.5–5.1)
Sodium: 135 mmol/L (ref 135–145)

## 2021-02-20 MED ORDER — LEVOFLOXACIN 750 MG PO TABS
750.0000 mg | ORAL_TABLET | Freq: Every day | ORAL | Status: DC
  Administered 2021-02-20 – 2021-02-22 (×3): 750 mg via ORAL
  Filled 2021-02-20 (×3): qty 1

## 2021-02-20 MED ORDER — DOXYCYCLINE HYCLATE 100 MG PO TABS
100.0000 mg | ORAL_TABLET | Freq: Two times a day (BID) | ORAL | Status: DC
  Administered 2021-02-20 – 2021-02-22 (×5): 100 mg via ORAL
  Filled 2021-02-20 (×5): qty 1

## 2021-02-20 NOTE — Progress Notes (Addendum)
Pt HR sustaining ST with rates up to 107. Dr. Toniann Fail notified.   Correction - CCM provider notified

## 2021-02-20 NOTE — Consult Note (Signed)
[facility]    Date of Admission:  02/18/2021     Total days of antibiotics 3               Reason for Consult: Sacral Ulcer / Osteomyelitis   Referring Provider: Dr. Jerral Ralph Primary Care Provider: Marletta Lor, NP   ASSESSMENT:  [name] is a pleasant 50 y/o caucasian female with MS complicated by chronic sacral osteomyelitis and ulcer admitted with concern for worsening symptoms. Initial concern for sepsis requiring brief period of vasopressor support. Sacral wound appears without evidence of infection. At this point does not appear to need a prolonged course of antibiotics and recommend changing antibiotics to levofloxacin and doxycycline for total treatment duration of 7 days. Continue wound care per Wound RN note. Optimize nutrition and mobility keeping pressure off the site. Continue to follow up with wound care. ID will sign off.   PLAN:  Change antibiotics to levofloxacin and doxycycline.  Wound care per Wound RN recommendations.  Off load site and optimize nutrition.  Remaining medical and supportive care per primary team.  ID will sign off.    Principal Problem:   Sepsis (HCC) Active Problems:   Stage 4 decubitus ulcer (HCC)   Sacral osteomyelitis (HCC)   Malnutrition of moderate degree    baclofen  20 mg Oral BID   Chlorhexidine Gluconate Cloth  6 each Topical Daily   feeding supplement  237 mL Oral TID BM   heparin  5,000 Units Subcutaneous Q8H   mouth rinse  15 mL Mouth Rinse BID   multivitamin with minerals  1 tablet Oral Daily   nutrition supplement (JUVEN)  1 packet Oral BID BM     HPI: [name] is a 50 y.o. female with previous medical history of multiple sclerosis complicated by chronic sacral osteomyelitis secondary to being bedbound admitted worsening right buttock wound.  [name] is know to the ID service having last been treated for culture negative sacral osteomyelitis in November 2020 with  vancomycin and ceftriaxone. Now with concern for worsening wound. No fever or chills at home. Chest x-ray with mild lower lobe atelectasis. CT abdomen/pelvis with deep right gluteal ulcer extending to the ischial tuberosity with associated osteomyelitis and no abscesses. General Surgery consulted withno acute need for surgical intervention as the the tissue appears clean and healthy. Wound RN recommends packing right wound with calcium alginate for absorption and covering with dry dressing.   [name] has been afebrile since admission with no leukocytosis. Currently on Day 3 of antibiotic therapy with vancomycin and piperacillin-tazobactam. She has increased levels of anxiety being in the hospital and about her wound. Also has increased anxiety when talking about wound care.    Review of Systems: Review of Systems  Constitutional:  Negative for chills, fever and weight loss.  Respiratory:  Negative for cough, shortness of breath and wheezing.   Cardiovascular:  Negative for chest pain and leg swelling.  Gastrointestinal:  Negative for abdominal pain, constipation, diarrhea, nausea and vomiting.  Skin:  Negative for rash.  Psychiatric/Behavioral:  The patient is nervous/anxious.     Past Medical History:  Diagnosis Date   Arthritis    MS (multiple sclerosis) (HCC)     Social History   Tobacco Use   Smoking status: Former    Packs/day: 2.00    Years: 20.00    Pack years: 40.00    Types: Cigarettes    Quit date: 07/03/2008  Years since quitting: 12.6   Smokeless tobacco: Former    Quit date: 01/10/2019   Tobacco comments:    [place]rence quit 01/2019  Substance Use Topics   Alcohol use: No   Drug use: No    History reviewed. No pertinent family history.  Allergies  Allergen Reactions   Silver    Tizanidine Diarrhea   Tomato     OBJECTIVE: Blood pressure 132/65, pulse 94, temperature 99.1 F (37.3 C), temperature source Oral, resp. rate 18, height 5'  2" (1.575 m), weight 67.9 kg, SpO2 99 %.  Physical Exam Constitutional:      General: She is not in acute distress.    Appearance: She is well-developed.  Cardiovascular:     Rate and Rhythm: Normal rate and regular rhythm.     Heart sounds: Normal heart sounds.  Pulmonary:     Effort: Pulmonary effort is normal.     Breath sounds: Normal breath sounds.  Skin:    General: Skin is warm and dry.  Neurological:     Mental Status: She is alert and oriented to person, place, and time.  Psychiatric:        Mood and Affect: Mood is anxious.    Lab Results Lab Results  Component Value Date   WBC 7.1 02/19/2021   HGB 12.7 02/19/2021   HCT 41.7 02/19/2021   MCV 96.8 02/19/2021   PLT 162 02/19/2021    Lab Results  Component Value Date   CREATININE 0.63 02/20/2021   BUN 19 02/20/2021   NA 135 02/20/2021   K 3.9 02/20/2021   CL 104 02/20/2021   CO2 23 02/20/2021    Lab Results  Component Value Date   ALT 9 02/18/2021   AST 26 02/18/2021   ALKPHOS 89 02/18/2021   BILITOT 0.4 02/18/2021     Microbiology: Recent Results (from the past 240 hour(s))  Blood Culture (routine x 2)     Status: None (Preliminary result)   Collection Time: 02/18/21  6:38 PM   Specimen: BLOOD  Result Value Ref Range Status   Specimen Description BLOOD RIGHT ANTECUBITAL  Final   Special Requests   Final    BOTTLES DRAWN AEROBIC AND ANAEROBIC Blood Culture results may not be optimal due to an inadequate volume of blood received in culture bottles   Culture   Final    NO GROWTH 2 DAYS Performed at [facility], [place] [address] [place], [place] [address]    Report Status PENDING  Incomplete  Blood Culture (routine x 2)     Status: None (Preliminary result)   Collection Time: 02/18/21  8:06 PM   Specimen: BLOOD LEFT HAND  Result Value Ref Range Status   Specimen Description BLOOD LEFT HAND  Final   Special Requests   Final    BOTTLES DRAWN AEROBIC AND ANAEROBIC Blood Culture results may not  be optimal due to an inadequate volume of blood received in culture bottles   Culture   Final    NO GROWTH 2 DAYS Performed at [facility], [place] [address], [place], [place] [address]    Report Status PENDING  Incomplete  Resp Panel by RT-PCR (Flu A&B, Covid) Nasopharyngeal Swab     Status: None   Collection Time: 02/19/21  1:42 AM   Specimen: Nasopharyngeal Swab; Nasopharyngeal(NP) swabs in vial transport medium  Result Value Ref Range Status   SARS Coronavirus 2 by RT PCR NEGATIVE NEGATIVE Final    Comment: (  NOTE) SARS-CoV-2 target nucleic acids are NOT DETECTED.  The SARS-CoV-2 RNA is generally detectable in upper respiratory specimens during the acute phase of infection. The lowest concentration of SARS-CoV-2 viral copies this assay can detect is 138 copies/mL. A negative result does not preclude SARS-Cov-2 infection and should not be used as the sole basis for treatment or other patient management decisions. A negative result may occur with  improper specimen collection/handling, submission of specimen other than nasopharyngeal swab, presence of viral mutation(s) within the areas targeted by this assay, and inadequate number of viral copies(<138 copies/mL). A negative result must be combined with clinical observations, patient history, and epidemiological information. The expected result is Negative.  Fact Sheet for Patients:  EntrepreneurPulse.com.au  Fact Sheet for Healthcare Providers:  IncredibleEmployment.be  This test is no t yet approved or cleared by the [place] FDA and  has been authorized for detection and/or diagnosis of SARS-CoV-2 by FDA under an Emergency Use Authorization (EUA). This EUA will remain  in effect (meaning this test can be used) for the duration of the COVID-19 declaration under Section 564(b)(1) of the Act, 21 U.S.C.section 360bbb-3(b)(1), unless the authorization is terminated  or revoked sooner.        Influenza A by PCR NEGATIVE NEGATIVE Final   Influenza B by PCR NEGATIVE NEGATIVE Final    Comment: (NOTE) The Xpert Xpress SARS-CoV-2/FLU/RSV plus assay is intended as an aid in the diagnosis of influenza from Nasopharyngeal swab specimens and should not be used as a sole basis for treatment. Nasal washings and aspirates are unacceptable for Xpert Xpress SARS-CoV-2/FLU/RSV testing.  Fact Sheet for Patients: EntrepreneurPulse.com.au  Fact Sheet for Healthcare Providers: IncredibleEmployment.be  This test is not yet approved or cleared by the [place] FDA and has been authorized for detection and/or diagnosis of SARS-CoV-2 by FDA under an Emergency Use Authorization (EUA). This EUA will remain in effect (meaning this test can be used) for the duration of the COVID-19 declaration under Section 564(b)(1) of the Act, 21 U.S.C. section 360bbb-3(b)(1), unless the authorization is terminated or revoked.  Performed at [facility], [place] [address] [place], [place] [address]   MRSA Next Gen by PCR, Nasal     Status: None   Collection Time: 02/19/21  2:51 AM   Specimen: Nasal Mucosa; Nasal Swab  Result Value Ref Range Status   MRSA by PCR Next Gen NOT DETECTED NOT DETECTED Final    Comment: (NOTE) The GeneXpert MRSA Assay (FDA approved for NASAL specimens only), is one component of a comprehensive MRSA colonization surveillance program. It is not intended to diagnose MRSA infection nor to guide or monitor treatment for MRSA infections. Test performance is not FDA approved in patients less than 3 years old. Performed at [facility], [place] [address] [place], [place] [address]      Terri Piedra, [facility]  02/20/2021  1:49 PM

## 2021-02-20 NOTE — Plan of Care (Signed)

## 2021-02-20 NOTE — Progress Notes (Signed)
PROGRESS NOTE        PATIENT DETAILS Name: [name] Age: 50 y.o. Sex: female Date of Birth: [birth year] Admit Date: 02/18/2021 Admitting Physician Collier Bullock, MD QH:[id number], Almyra Free, NP  Brief Narrative: Patient is a 50 y.o. female with history of advanced MS-paraplegic-bedbound-stage IV sacral decubitus ulcer with underlying chronic osteomyelitis-presented to the hospital on 12/11 with septic shock-source thought to be sacral decubitus ulcer.  Initially admitted to the ICU-required pressors-upon stability-transferred to the [facility] service.  Significant events: 12/11>> admitted with septic shock from infected sacral wound-Levophed/empiric Abx started. 12/12>> off pressors 12/13>> transfer to [facility]  Subjective significant events: Lying comfortably in bed-denies any chest pain or shortness of breath.  Objective: Vitals: Blood pressure 132/76, pulse 93, temperature 99.1 F (37.3 C), temperature source Oral, resp. rate 18, height 5\' 2"  (1.575 m), weight 67.9 kg, SpO2 99 %.   Exam: Gen Exam:Alert awake-not in any distress HEENT:atraumatic, normocephalic Chest: B/L clear to auscultation anteriorly CVS:S1S2 regular Abdomen:soft non tender, non distended Extremities:no edema Neurology: Paraplegic. Skin: no rash  Pertinent Labs/Radiology: Recent Labs  Lab 02/18/21 1844 02/18/21 1856 02/19/21 0558 02/19/21 0946 02/20/21 0151  WBC 7.1   < > 7.1  --   --   HGB 14.0   < > 12.7  --   --   PLT 224   < > 162  --   --   NA 135   < >  --    < > 135  K 3.4*   < >  --    < > 3.9  CREATININE 0.76   < >  --    < > 0.63  AST 26  --   --   --   --   ALT 9  --   --   --   --   ALKPHOS 89  --   --   --   --   BILITOT 0.4  --   --   --   --    < > = values in this interval not displayed.    Assessment/Plan: Septic shock due to infected stage IV sacral decubitus ulcer with underlying osteomyelitis of the ischial tuberosity: Sepsis physiology has  resolved-blood cultures remain negative-General surgery evaluation completed-recommendations are for ongoing wound care.  Remains on empiric vancomycin and Zosyn.  Has been treated in 2021 with several weeks of antibiotics for underlying osteomyelitis.  I will discuss with infectious disease-regarding antibiotic regimen going forward.  History of advanced MS with paraplegia: Bedbound at baseline-continue baclofen  Nutrition Status: Nutrition Problem: Moderate Malnutrition Etiology: chronic illness (chronic wounds; MS) Signs/Symptoms: percent weight loss, mild fat depletion, moderate muscle depletion Percent weight loss: 10.5 % Interventions: Ensure Enlive (each supplement provides 350kcal and 20 grams of protein), MVI, Juven  BMI Estimated body mass index is 27.38 kg/m as calculated from the following:   Height as of this encounter: 5\' 2"  (1.575 m).   Weight as of this encounter: 67.9 kg.   Procedures: None Consults: None DVT Prophylaxis: SQ Heparin Code Status:Full code  Family Communication: None at bedside  Time spent: 25- minutes-Greater than 50% of this time was spent in counseling, explanation of diagnosis, planning of further management, and coordination of care.   Disposition Plan: Status is: Inpatient  Remains inpatient appropriate because: Resolving sepsis physiology-sacral decubitus ulcer.  Needs another few  more days of antibiotics before consideration of discharge.    Diet: Diet Order             Diet regular Room service appropriate? Yes; Fluid consistency: Thin  Diet effective now                     Antimicrobial agents: Anti-infectives (From admission, onward)    Start     Dose/Rate Route Frequency Ordered Stop   02/20/21 0000  vancomycin (VANCOREADY) IVPB 1250 mg/250 mL        1,250 mg 166.7 mL/hr over 90 Minutes Intravenous Every 24 hours 02/19/21 0145     02/19/21 0600  piperacillin-tazobactam (ZOSYN) IVPB 3.375 g        3.375 g 12.5 mL/hr  over 240 Minutes Intravenous Every 8 hours 02/18/21 2251     02/18/21 2200  vancomycin (VANCOREADY) IVPB 1750 mg/350 mL        1,750 mg 175 mL/hr over 120 Minutes Intravenous  Once 02/18/21 2131 02/19/21 0212   02/18/21 2145  piperacillin-tazobactam (ZOSYN) IVPB 3.375 g        3.375 g 100 mL/hr over 30 Minutes Intravenous  Once 02/18/21 2131 02/18/21 2259        MEDICATIONS: Scheduled Meds:  baclofen  20 mg Oral BID   Chlorhexidine Gluconate Cloth  6 each Topical Daily   feeding supplement  237 mL Oral TID BM   heparin  5,000 Units Subcutaneous Q8H   mouth rinse  15 mL Mouth Rinse BID   multivitamin with minerals  1 tablet Oral Daily   nutrition supplement (JUVEN)  1 packet Oral BID BM   Continuous Infusions:  sodium chloride Stopped (02/19/21 0030)   piperacillin-tazobactam (ZOSYN)  IV 3.375 g (02/20/21 0918)   vancomycin Stopped (02/20/21 0146)   PRN Meds:.docusate sodium, polyethylene glycol, traMADol   I have personally reviewed following labs and imaging studies  LABORATORY DATA: CBC: Recent Labs  Lab 02/18/21 1844 02/18/21 1856 02/18/21 2159 02/18/21 2302 02/18/21 2336 02/19/21 0558  WBC 7.1  --   --  5.6  --  7.1  NEUTROABS 3.4  --   --   --   --   --   HGB 14.0 14.3 13.9 11.5* 11.6* 12.7  HCT 44.2 42.0 41.0 36.8 34.0* 41.7  MCV 95.7  --   --  97.4  --  96.8  PLT 224  --   --  177  --  0000000    Basic Metabolic Panel: Recent Labs  Lab 02/18/21 1844 02/18/21 1856 02/18/21 2159 02/18/21 2302 02/18/21 2336 02/19/21 0946 02/20/21 0151  NA 135 139 138  --  141 138 135  K 3.4* 3.4* 3.3*  --  3.6 3.8 3.9  CL 103 101  --   --   --  110 104  CO2 24  --   --   --   --  23 23  GLUCOSE 115* 116*  --   --   --  88 111*  BUN 20 22*  --   --   --  13 19  CREATININE 0.76 0.90  --  0.77  --  0.58 0.63  CALCIUM 8.8*  --   --   --   --  8.4* 8.7*    GFR: Estimated Creatinine Clearance: 74.3 mL/min (by C-G formula based on SCr of 0.63 mg/dL).  Liver Function  Tests: Recent Labs  Lab 02/18/21 1844  AST 26  ALT 9  ALKPHOS 89  BILITOT 0.4  PROT 6.6  ALBUMIN 2.5*   No results for input(s): LIPASE, AMYLASE in the last 168 hours. No results for input(s): AMMONIA in the last 168 hours.  Coagulation Profile: Recent Labs  Lab 02/18/21 1844  INR 1.1    Cardiac Enzymes: No results for input(s): CKTOTAL, CKMB, CKMBINDEX, TROPONINI in the last 168 hours.  BNP (last 3 results) No results for input(s): PROBNP in the last 8760 hours.  Lipid Profile: No results for input(s): CHOL, HDL, LDLCALC, TRIG, CHOLHDL, LDLDIRECT in the last 72 hours.  Thyroid Function Tests: No results for input(s): TSH, T4TOTAL, FREET4, T3FREE, THYROIDAB in the last 72 hours.  Anemia Panel: No results for input(s): VITAMINB12, FOLATE, FERRITIN, TIBC, IRON, RETICCTPCT in the last 72 hours.  Urine analysis:    Component Value Date/Time   COLORURINE YELLOW 02/18/2021 0025   APPEARANCEUR HAZY (A) 02/18/2021 0025   LABSPEC 1.010 02/18/2021 0025   PHURINE 5.0 02/18/2021 0025   GLUCOSEU NEGATIVE 02/18/2021 0025   HGBUR MODERATE (A) 02/18/2021 0025   BILIRUBINUR NEGATIVE 02/18/2021 0025   KETONESUR NEGATIVE 02/18/2021 0025   PROTEINUR NEGATIVE 02/18/2021 0025   UROBILINOGEN 1.0 09/11/2010 1052   NITRITE NEGATIVE 02/18/2021 0025   LEUKOCYTESUR LARGE (A) 02/18/2021 0025    Sepsis Labs: Lactic Acid, Venous    Component Value Date/Time   LATICACIDVEN 1.9 02/18/2021 2029    MICROBIOLOGY: Recent Results (from the past 240 hour(s))  Blood Culture (routine x 2)     Status: None (Preliminary result)   Collection Time: 02/18/21  6:38 PM   Specimen: BLOOD  Result Value Ref Range Status   Specimen Description BLOOD RIGHT ANTECUBITAL  Final   Special Requests   Final    BOTTLES DRAWN AEROBIC AND ANAEROBIC Blood Culture results may not be optimal due to an inadequate volume of blood received in culture bottles   Culture   Final    NO GROWTH 2 DAYS Performed at  [facility], [address] [place], [place] [address]    Report Status PENDING  Incomplete  Blood Culture (routine x 2)     Status: None (Preliminary result)   Collection Time: 02/18/21  8:06 PM   Specimen: BLOOD LEFT HAND  Result Value Ref Range Status   Specimen Description BLOOD LEFT HAND  Final   Special Requests   Final    BOTTLES DRAWN AEROBIC AND ANAEROBIC Blood Culture results may not be optimal due to an inadequate volume of blood received in culture bottles   Culture   Final    NO GROWTH 2 DAYS Performed at [facility], [address] [place], [place] [address]    Report Status PENDING  Incomplete  Resp Panel by RT-PCR (Flu A&B, Covid) Nasopharyngeal Swab     Status: None   Collection Time: 02/19/21  1:42 AM   Specimen: Nasopharyngeal Swab; Nasopharyngeal(NP) swabs in vial transport medium  Result Value Ref Range Status   SARS Coronavirus 2 by RT PCR NEGATIVE NEGATIVE Final    Comment: (NOTE) SARS-CoV-2 target nucleic acids are NOT DETECTED.  The SARS-CoV-2 RNA is generally detectable in upper respiratory specimens during the acute phase of infection. The lowest concentration of SARS-CoV-2 viral copies this assay can detect is 138 copies/mL. A negative result does not preclude SARS-Cov-2 infection and should not be used as the sole basis for treatment or other patient management decisions. A negative result may occur with  improper specimen collection/handling, submission of specimen other than nasopharyngeal swab,  presence of viral mutation(s) within the areas targeted by this assay, and inadequate number of viral copies(<138 copies/mL). A negative result must be combined with clinical observations, patient history, and epidemiological information. The expected result is Negative.  Fact Sheet for Patients:  EntrepreneurPulse.com.au  Fact Sheet for Healthcare Providers:  IncredibleEmployment.be  This test is no t  yet approved or cleared by the [place] FDA and  has been authorized for detection and/or diagnosis of SARS-CoV-2 by FDA under an Emergency Use Authorization (EUA). This EUA will remain  in effect (meaning this test can be used) for the duration of the COVID-19 declaration under Section 564(b)(1) of the Act, 21 U.S.C.section 360bbb-3(b)(1), unless the authorization is terminated  or revoked sooner.       Influenza A by PCR NEGATIVE NEGATIVE Final   Influenza B by PCR NEGATIVE NEGATIVE Final    Comment: (NOTE) The Xpert Xpress SARS-CoV-2/FLU/RSV plus assay is intended as an aid in the diagnosis of influenza from Nasopharyngeal swab specimens and should not be used as a sole basis for treatment. Nasal washings and aspirates are unacceptable for Xpert Xpress SARS-CoV-2/FLU/RSV testing.  Fact Sheet for Patients: EntrepreneurPulse.com.au  Fact Sheet for Healthcare Providers: IncredibleEmployment.be  This test is not yet approved or cleared by the [place] FDA and has been authorized for detection and/or diagnosis of SARS-CoV-2 by FDA under an Emergency Use Authorization (EUA). This EUA will remain in effect (meaning this test can be used) for the duration of the COVID-19 declaration under Section 564(b)(1) of the Act, 21 U.S.C. section 360bbb-3(b)(1), unless the authorization is terminated or revoked.  Performed at [facility], [place] [address] [place], [place] [address]   MRSA Next Gen by PCR, Nasal     Status: None   Collection Time: 02/19/21  2:51 AM   Specimen: Nasal Mucosa; Nasal Swab  Result Value Ref Range Status   MRSA by PCR Next Gen NOT DETECTED NOT DETECTED Final    Comment: (NOTE) The GeneXpert MRSA Assay (FDA approved for NASAL specimens only), is one component of a comprehensive MRSA colonization surveillance program. It is not intended to diagnose MRSA infection nor to guide or monitor treatment for MRSA  infections. Test performance is not FDA approved in patients less than 31 years old. Performed at [facility], [place] [address], [place], [place] [address]     RADIOLOGY STUDIES/RESULTS: CT ABDOMEN PELVIS W CONTRAST  Result Date: 02/19/2021 CLINICAL DATA:  Abdominal pain, pressure wound on right buttock EXAM: CT ABDOMEN AND PELVIS WITH CONTRAST TECHNIQUE: Multidetector CT imaging of the abdomen and pelvis was performed using the standard protocol following bolus administration of intravenous contrast. CONTRAST:  180mL OMNIPAQUE IOHEXOL 300 MG/ML  SOLN COMPARISON:  MRI pelvis dated 12/12/2020. CT abdomen/pelvis dated 11/12/2020. FINDINGS: Lower chest: Lung bases are clear. Hepatobiliary: Macronodular hepatic contour, suggesting cirrhosis. No focal hepatic lesion is seen. Gallbladder is unremarkable. No intrahepatic or extrahepatic ductal dilatation. Pancreas: Within normal limits. Spleen: Lobular spleen, although within the upper limits of normal for size. Adrenals/Urinary Tract: Adrenal glands are within normal limits. 2.2 cm right lower pole renal cyst (series 4/image 33), benign. 3 mm nonobstructing left lower pole renal calculus (series 4/image 33). No hydronephrosis. Bladder is within normal limits. Stomach/Bowel: Stomach is within normal limits. No evidence of bowel obstruction. Normal appendix (series 4/image 53). Mild to moderate rectal stool burden. Vascular/Lymphatic: No evidence of abdominal aortic aneurysm. Atherosclerotic calcifications of the abdominal aorta and branch vessels. Perigastric varices.  Portal  vein is patent. Reproductive: Uterus is within normal limits. No adnexal masses. Other: No abdominopelvic ascites. Musculoskeletal: Moderate degenerative changes of the visualized thoracolumbar spine. Deep right gluteal ulcer extending to the ischial tuberosity (series 4/image 86), with associated cortical destruction reflecting osteomyelitis (series 4/image 83), better evaluated on  prior MRI. No associated abscess. IMPRESSION: Deep right gluteal ulcer extending to the ischial tuberosity with associated osteomyelitis, better evaluated on prior MRI. No associated abscess. Cirrhosis. Perigastric varices. Portal vein is patent. Spleen is within the upper limits of normal for size. No abdominal ascites. Additional ancillary findings as above. Electronically Signed   By: Julian Hy M.D.   On: 02/19/2021 01:34   DG Chest Port 1 View  Result Date: 02/18/2021 CLINICAL DATA:  Pressure wound on right buttock, possible sepsis EXAM: PORTABLE CHEST 1 VIEW COMPARISON:  01/08/2021 FINDINGS: Mild lower lobe opacities, likely atelectasis. No frank interstitial edema. No pleural effusion or pneumothorax. The heart is normal in size. IMPRESSION: Mild lower lobe atelectasis. Electronically Signed   By: Julian Hy M.D.   On: 02/18/2021 20:13     LOS: 2 days   Oren Binet, MD  [facility]    To contact the attending provider between 7A-7P or the covering provider during after hours 7P-7A, please log into the web site [email or web address] and access using universal [place] password for that web site. If you do not have the password, please call the hospital operator.  02/20/2021, 10:17 AM

## 2021-02-20 NOTE — Consult Note (Signed)
WOC Nurse wound follow up Patient receiving care in [facility] 5W02 Seen yesterday via elink by M. Austin. Today consult is for a left thigh wound Wound type: Partial thickness with crusting Measurement: 1.5 x 1 Wound bed: brown Drainage (amount, consistency, odor) None Periwound: Intact Dressing procedure/placement/frequency: Continue with a small foam dressing to the site. Assess wound daily and change foam dressing every 3 days.   Monitor the wound area(s) for worsening of condition such as: Signs/symptoms of infection, increase in size, development of or worsening of odor, development of pain, or increased pain at the affected locations.   Notify the medical team if any of these develop.  Thank you for the consult. WOC nurse will not follow at this time.   Please re-consult the WOC team if needed.  Renaldo Reel Katrinka Blazing, MSN, RN, CMSRN, Angus Seller, [facility] Wound Treatment Associate Pager [phone]

## 2021-02-20 NOTE — TOC Initial Note (Signed)
Transition of Care [facility]) - Initial/Assessment Note    Patient Details  Name: [name] MRN: [id number] Date of Birth: 1970-08-31  Transition of Care [facility]) CM/SW Contact:    Lawerance Sabal, RN Phone Number: 02/20/2021, 11:41 AM  Clinical Narrative:         Sherron Monday w patient at bedside. She states that she will want to return home at DC. She lives at home w her mother and father who care for her. She endorses being bed bound, Hx MS, chronic sacral wound, osteo, pending ID consult for Abx recommendations. Patient states that she has a nurse from [place] who does dressing changes twice a week and her mother does them other days, she would like this to continue. Message sent to [facility] to notify of Dc as early as tomorrow if patient DC's on PO abx. Will need resumption of care orders for [facility].  Patient states that she has all needed DME at home.  She will need medical transport via stretcher to home, confirmed address on file.            Expected Discharge Plan: Home w Home Health Services Barriers to Discharge: Continued Medical Work up   Patient Goals and CMS Choice Patient states their goals for this hospitalization and ongoing recovery are:: return home CMS Medicare.gov Compare Post Acute Care list provided to:: Patient Choice offered to / list presented to : Patient  Expected Discharge Plan and Services Expected Discharge Plan: Home w Home Health Services   Discharge Planning Services: CM Consult Post Acute Care Choice: Home Health Living arrangements for the past 2 months: Single Family Home                             HH Agency: [facility] Date [facility] Agency Contacted: 02/20/21 Time HH Agency Contacted: 1140 Representative spoke with at [facility] Agency: Kandee Keen  Prior Living Arrangements/Services Living arrangements for the past 2 months: Single Family Home Lives with:: Parents              Current home services: DME    Activities of Daily Living Home  Assistive Devices/Equipment: Eyeglasses, Wheelchair, [employer], Hospital bed, Blood pressure cuff ADL Screening (condition at time of admission) Patient's cognitive ability adequate to safely complete daily activities?: No Is the patient deaf or have difficulty hearing?: No Does the patient have difficulty seeing, even when wearing glasses/contacts?: No Does the patient have difficulty concentrating, remembering, or making decisions?: Yes Patient able to express need for assistance with ADLs?: Yes Does the patient have difficulty dressing or bathing?: Yes Independently performs ADLs?: No Communication: Independent Dressing (OT): Dependent Is this a change from baseline?: Pre-admission baseline Grooming: Needs assistance Is this a change from baseline?: Pre-admission baseline Feeding: Needs assistance Is this a change from baseline?: Pre-admission baseline Bathing: Dependent Is this a change from baseline?: Pre-admission baseline Toileting: Dependent Is this a change from baseline?: Pre-admission baseline In/Out Bed: Dependent Is this a change from baseline?: Pre-admission baseline Walks in Home: Dependent (does not walk/bedbound) Is this a change from baseline?: Pre-admission baseline Does the patient have difficulty walking or climbing stairs?: Yes Weakness of Legs: Both Weakness of Arms/Hands: Both  Permission Sought/Granted                  Emotional Assessment              Admission diagnosis:  Sepsis (HCC) [A41.9] Hypotension, unspecified hypotension  type [I95.9] Pressure injury of skin of sacral region, unspecified injury stage [L89.159] Patient Active Problem List   Diagnosis Date Noted   Malnutrition of moderate degree 02/20/2021   Sepsis ([place]) 123456   Acute metabolic encephalopathy 99991111   Decubitus ulcer 12/13/2020   Decubitus ulcer, unstageable with infection ([place]) 12/12/2020   UTI (urinary tract infection) 12/12/2020   AKI (acute kidney  injury) ([place]) 11/12/2020   Sacral osteomyelitis ([place]) 01/28/2019   Stage 4 decubitus ulcer ([place]) 01/25/2019   Cellulitis 01/25/2019   Decubitus skin ulcer 01/25/2019   Anxiety 05/13/2013   Essential hypertension, benign 04/25/2013   Encounter for long-term (current) use of other medications 04/25/2013   Constipation 04/09/2013   Displaced bimalleolar fracture of left ankle 03/30/2013   Multiple sclerosis ([place]) 08/25/2012   Chest pain 08/25/2012   Panic attack 08/25/2012   PCP:  Alvester Chou, NP Pharmacy:   [id number] - [place], [place] [place] [address] Phone: [phone] [phone]     Social Determinants of Health (SDOH) Interventions    Readmission Risk Interventions No flowsheet data found.

## 2021-02-21 LAB — BASIC METABOLIC PANEL
Anion gap: 7 (ref 5–15)
BUN: 15 mg/dL (ref 6–20)
CO2: 28 mmol/L (ref 22–32)
Calcium: 9.3 mg/dL (ref 8.9–10.3)
Chloride: 101 mmol/L (ref 98–111)
Creatinine, Ser: 0.52 mg/dL (ref 0.44–1.00)
GFR, Estimated: >60 mL/min (ref >60)
Glucose, Bld: 102 mg/dL — ABNORMAL HIGH (ref 70–99)
Potassium: 3.8 mmol/L (ref 3.5–5.1)
Sodium: 136 mmol/L (ref 135–145)

## 2021-02-21 LAB — CBC
HCT: 36.2 % (ref 36.0–46.0)
Hemoglobin: 11.4 g/dL — ABNORMAL LOW (ref 12.0–15.0)
MCH: 29.5 pg (ref 26.0–34.0)
MCHC: 31.5 g/dL (ref 30.0–36.0)
MCV: 93.8 fL (ref 80.0–100.0)
Platelets: 212 10*3/uL (ref 150–400)
RBC: 3.86 MIL/uL — ABNORMAL LOW (ref 3.87–5.11)
RDW: 15.6 % — ABNORMAL HIGH (ref 11.5–15.5)
WBC: 4.7 10*3/uL (ref 4.0–10.5)
nRBC: 0 % (ref 0.0–0.2)

## 2021-02-21 LAB — C-REACTIVE PROTEIN: CRP: 2.1 mg/dL — ABNORMAL HIGH (ref <1.0)

## 2021-02-21 MED ORDER — ONDANSETRON HCL 4 MG/2ML IJ SOLN
4.0000 mg | Freq: Four times a day (QID) | INTRAMUSCULAR | Status: DC | PRN
  Administered 2021-02-21: 18:00:00 4 mg via INTRAVENOUS
  Filled 2021-02-21: qty 2

## 2021-02-21 NOTE — Plan of Care (Signed)
  Problem: Clinical Measurements: Goal: Ability to maintain clinical measurements within normal limits will improve Outcome: Progressing Goal: Will remain free from infection Outcome: Progressing Goal: Diagnostic test results will improve Outcome: Progressing Goal: Respiratory complications will improve Outcome: Progressing Goal: Cardiovascular complication will be avoided Outcome: Progressing   Problem: Elimination: Goal: Will not experience complications related to bowel motility Outcome: Progressing Goal: Will not experience complications related to urinary retention Outcome: Progressing   Problem: Pain Managment: Goal: General experience of comfort will improve Outcome: Progressing   Problem: Safety: Goal: Ability to remain free from injury will improve Outcome: Progressing   

## 2021-02-21 NOTE — Care Management Important Message (Signed)
Important Message  Patient Details  Name: ABBAGAIL SCAFF MRN: [id number] Date of Birth: 1971/02/13   Medicare Important Message Given:  Yes     Denene Alamillo 02/21/2021, 3:35 PM

## 2021-02-21 NOTE — Progress Notes (Signed)
PROGRESS NOTE        PATIENT DETAILS Name: [name] Age: 50 y.o. Sex: female Date of Birth: 1970-11-27 Admit Date: 02/18/2021 Admitting Physician Charlotte Sanes, MD FIE:PPIR, Raynelle Fanning, NP  Brief Narrative: Patient is a 50 y.o. female with history of advanced MS-paraplegic-bedbound-stage IV sacral decubitus ulcer with underlying chronic osteomyelitis-presented to the hospital on 12/11 with septic shock-source thought to be sacral decubitus ulcer.  Initially admitted to the ICU-required pressors-upon stability-transferred to the [facility] hospitalist service.  Significant events: 12/11>> admitted with septic shock from infected sacral wound-Levophed/empiric Abx started. 12/12>> off pressors 12/13>> transfer to [facility]  Subjective significant events: Nauseous this morning-apparently threw up last night.  Objective: Vitals: Blood pressure 139/81, pulse 100, temperature 97.6 F (36.4 C), temperature source Oral, resp. rate 19, height 5\' 2"  (1.575 m), weight 64.8 kg, SpO2 98 %.   Exam: Gen Exam:Alert awake-not in any distress HEENT:atraumatic, normocephalic Chest: B/L clear to auscultation anteriorly CVS:S1S2 regular Abdomen:soft non tender, non distended Extremities:no edema Neurology: Non focal Skin: no rash   Pertinent Labs/Radiology: Recent Labs  Lab 02/18/21 1844 02/18/21 1856 02/21/21 0056  WBC 7.1   < > 4.7  HGB 14.0   < > 11.4*  PLT 224   < > 212  NA 135   < > 136  K 3.4*   < > 3.8  CREATININE 0.76   < > 0.52  AST 26  --   --   ALT 9  --   --   ALKPHOS 89  --   --   BILITOT 0.4  --   --    < > = values in this interval not displayed.     Assessment/Plan: Septic shock due to infected stage IV sacral decubitus ulcer with underlying osteomyelitis of the ischial tuberosity: Sepsis physiology resolved-blood cultures remain negative-recommendations from ID for levofloxacin/doxycycline x7 days (end date 12/17).  She is somewhat nauseous  today-we will manage with antiemetics-if stable-suspect home on 12/15.    History of advanced MS with paraplegia: Bedbound at baseline-continue baclofen  Nutrition Status: Nutrition Problem: Moderate Malnutrition Etiology: chronic illness (chronic wounds; MS) Signs/Symptoms: percent weight loss, mild fat depletion, moderate muscle depletion Percent weight loss: 10.5 % Interventions: Ensure Enlive (each supplement provides 350kcal and 20 grams of protein), MVI, Juven  BMI Estimated body mass index is 26.13 kg/m as calculated from the following:   Height as of this encounter: 5\' 2"  (1.575 m).   Weight as of this encounter: 64.8 kg.   Procedures: None Consults: None DVT Prophylaxis: SQ Heparin Code Status:Full code  Family Communication: None at bedside  Time spent: 25- minutes-Greater than 50% of this time was spent in counseling, explanation of diagnosis, planning of further management, and coordination of care.   Disposition Plan: Status is: Inpatient  Remains inpatient appropriate because: Resolving sepsis physiology-sacral decubitus ulcer.  Needs another few more days of antibiotics before consideration of discharge.    Diet: Diet Order             Diet regular Room service appropriate? Yes; Fluid consistency: Thin  Diet effective now                     Antimicrobial agents: Anti-infectives (From admission, onward)    Start     Dose/Rate Route Frequency Ordered Stop   02/20/21 1445  levofloxacin (  LEVAQUIN) tablet 750 mg        750 mg Oral Daily 02/20/21 1350     02/20/21 1445  doxycycline (VIBRA-TABS) tablet 100 mg        100 mg Oral Every 12 hours 02/20/21 1350     02/20/21 0000  vancomycin (VANCOREADY) IVPB 1250 mg/250 mL  Status:  Discontinued        1,250 mg 166.7 mL/hr over 90 Minutes Intravenous Every 24 hours 02/19/21 0145 02/20/21 1350   02/19/21 0600  piperacillin-tazobactam (ZOSYN) IVPB 3.375 g  Status:  Discontinued        3.375 g 12.5 mL/hr  over 240 Minutes Intravenous Every 8 hours 02/18/21 2251 02/20/21 1350   02/18/21 2200  vancomycin (VANCOREADY) IVPB 1750 mg/350 mL        1,750 mg 175 mL/hr over 120 Minutes Intravenous  Once 02/18/21 2131 02/19/21 0212   02/18/21 2145  piperacillin-tazobactam (ZOSYN) IVPB 3.375 g        3.375 g 100 mL/hr over 30 Minutes Intravenous  Once 02/18/21 2131 02/18/21 2259        MEDICATIONS: Scheduled Meds:  baclofen  20 mg Oral BID   Chlorhexidine Gluconate Cloth  6 each Topical Daily   doxycycline  100 mg Oral Q12H   feeding supplement  237 mL Oral TID BM   heparin  5,000 Units Subcutaneous Q8H   levofloxacin  750 mg Oral Daily   mouth rinse  15 mL Mouth Rinse BID   multivitamin with minerals  1 tablet Oral Daily   nutrition supplement (JUVEN)  1 packet Oral BID BM   Continuous Infusions:  sodium chloride Stopped (02/19/21 0030)   PRN Meds:.docusate sodium, polyethylene glycol, traMADol   I have personally reviewed following labs and imaging studies  LABORATORY DATA: CBC: Recent Labs  Lab 02/18/21 1844 02/18/21 1856 02/18/21 2159 02/18/21 2302 02/18/21 2336 02/19/21 0558 02/21/21 0056  WBC 7.1  --   --  5.6  --  7.1 4.7  NEUTROABS 3.4  --   --   --   --   --   --   HGB 14.0   < > 13.9 11.5* 11.6* 12.7 11.4*  HCT 44.2   < > 41.0 36.8 34.0* 41.7 36.2  MCV 95.7  --   --  97.4  --  96.8 93.8  PLT 224  --   --  177  --  162 212   < > = values in this interval not displayed.     Basic Metabolic Panel: Recent Labs  Lab 02/18/21 1844 02/18/21 1856 02/18/21 2159 02/18/21 2302 02/18/21 2336 02/19/21 0946 02/20/21 0151 02/21/21 0056  NA 135 139 138  --  141 138 135 136  K 3.4* 3.4* 3.3*  --  3.6 3.8 3.9 3.8  CL 103 101  --   --   --  110 104 101  CO2 24  --   --   --   --  23 23 28   GLUCOSE 115* 116*  --   --   --  88 111* 102*  BUN 20 22*  --   --   --  13 19 15   CREATININE 0.76 0.90  --  0.77  --  0.58 0.63 0.52  CALCIUM 8.8*  --   --   --   --  8.4* 8.7*  9.3     GFR: Estimated Creatinine Clearance: 72.7 mL/min (by C-G formula based on SCr of 0.52 mg/dL).  Liver Function Tests:  Recent Labs  Lab 02/18/21 1844  AST 26  ALT 9  ALKPHOS 89  BILITOT 0.4  PROT 6.6  ALBUMIN 2.5*    No results for input(s): LIPASE, AMYLASE in the last 168 hours. No results for input(s): AMMONIA in the last 168 hours.  Coagulation Profile: Recent Labs  Lab 02/18/21 1844  INR 1.1     Cardiac Enzymes: No results for input(s): CKTOTAL, CKMB, CKMBINDEX, TROPONINI in the last 168 hours.  BNP (last 3 results) No results for input(s): PROBNP in the last 8760 hours.  Lipid Profile: No results for input(s): CHOL, HDL, LDLCALC, TRIG, CHOLHDL, LDLDIRECT in the last 72 hours.  Thyroid Function Tests: No results for input(s): TSH, T4TOTAL, FREET4, T3FREE, THYROIDAB in the last 72 hours.  Anemia Panel: No results for input(s): VITAMINB12, FOLATE, FERRITIN, TIBC, IRON, RETICCTPCT in the last 72 hours.  Urine analysis:    Component Value Date/Time   COLORURINE YELLOW 02/18/2021 0025   APPEARANCEUR HAZY (A) 02/18/2021 0025   LABSPEC 1.010 02/18/2021 0025   PHURINE 5.0 02/18/2021 0025   GLUCOSEU NEGATIVE 02/18/2021 0025   HGBUR MODERATE (A) 02/18/2021 0025   BILIRUBINUR NEGATIVE 02/18/2021 0025   KETONESUR NEGATIVE 02/18/2021 0025   PROTEINUR NEGATIVE 02/18/2021 0025   UROBILINOGEN 1.0 09/11/2010 1052   NITRITE NEGATIVE 02/18/2021 0025   LEUKOCYTESUR LARGE (A) 02/18/2021 0025    Sepsis Labs: Lactic Acid, Venous    Component Value Date/Time   LATICACIDVEN 1.9 02/18/2021 2029    MICROBIOLOGY: Recent Results (from the past 240 hour(s))  Blood Culture (routine x 2)     Status: None (Preliminary result)   Collection Time: 02/18/21  6:38 PM   Specimen: BLOOD  Result Value Ref Range Status   Specimen Description BLOOD RIGHT ANTECUBITAL  Final   Special Requests   Final    BOTTLES DRAWN AEROBIC AND ANAEROBIC Blood Culture results may not be  optimal due to an inadequate volume of blood received in culture bottles   Culture   Final    NO GROWTH 3 DAYS Performed at [facility], [place] [address] [place], [place] [address]    Report Status PENDING  Incomplete  Blood Culture (routine x 2)     Status: None (Preliminary result)   Collection Time: 02/18/21  8:06 PM   Specimen: BLOOD LEFT HAND  Result Value Ref Range Status   Specimen Description BLOOD LEFT HAND  Final   Special Requests   Final    BOTTLES DRAWN AEROBIC AND ANAEROBIC Blood Culture results may not be optimal due to an inadequate volume of blood received in culture bottles   Culture   Final    NO GROWTH 3 DAYS Performed at [facility], [place] [address] [place], [place] [address]    Report Status PENDING  Incomplete  Resp Panel by RT-PCR (Flu A&B, Covid) Nasopharyngeal Swab     Status: None   Collection Time: 02/19/21  1:42 AM   Specimen: Nasopharyngeal Swab; Nasopharyngeal(NP) swabs in vial transport medium  Result Value Ref Range Status   SARS Coronavirus 2 by RT PCR NEGATIVE NEGATIVE Final    Comment: (NOTE) SARS-CoV-2 target nucleic acids are NOT DETECTED.  The SARS-CoV-2 RNA is generally detectable in upper respiratory specimens during the acute phase of infection. The lowest concentration of SARS-CoV-2 viral copies this assay can detect is 138 copies/mL. A negative result does not preclude SARS-Cov-2 infection and should not be used as the sole basis for treatment or other patient management  decisions. A negative result may occur with  improper specimen collection/handling, submission of specimen other than nasopharyngeal swab, presence of viral mutation(s) within the areas targeted by this assay, and inadequate number of viral copies(<138 copies/mL). A negative result must be combined with clinical observations, patient history, and epidemiological information. The expected result is Negative.  Fact Sheet for Patients:   EntrepreneurPulse.com.au  Fact Sheet for Healthcare Providers:  IncredibleEmployment.be  This test is no t yet approved or cleared by the [place] FDA and  has been authorized for detection and/or diagnosis of SARS-CoV-2 by FDA under an Emergency Use Authorization (EUA). This EUA will remain  in effect (meaning this test can be used) for the duration of the COVID-19 declaration under Section 564(b)(1) of the Act, 21 U.S.C.section 360bbb-3(b)(1), unless the authorization is terminated  or revoked sooner.       Influenza A by PCR NEGATIVE NEGATIVE Final   Influenza B by PCR NEGATIVE NEGATIVE Final    Comment: (NOTE) The Xpert Xpress SARS-CoV-2/FLU/RSV plus assay is intended as an aid in the diagnosis of influenza from Nasopharyngeal swab specimens and should not be used as a sole basis for treatment. Nasal washings and aspirates are unacceptable for Xpert Xpress SARS-CoV-2/FLU/RSV testing.  Fact Sheet for Patients: EntrepreneurPulse.com.au  Fact Sheet for Healthcare Providers: IncredibleEmployment.be  This test is not yet approved or cleared by the [place] FDA and has been authorized for detection and/or diagnosis of SARS-CoV-2 by FDA under an Emergency Use Authorization (EUA). This EUA will remain in effect (meaning this test can be used) for the duration of the COVID-19 declaration under Section 564(b)(1) of the Act, 21 U.S.C. section 360bbb-3(b)(1), unless the authorization is terminated or revoked.  Performed at [facility], [place] [address], [place], [place] [address]   MRSA Next Gen by PCR, Nasal     Status: None   Collection Time: 02/19/21  2:51 AM   Specimen: Nasal Mucosa; Nasal Swab  Result Value Ref Range Status   MRSA by PCR Next Gen NOT DETECTED NOT DETECTED Final    Comment: (NOTE) The GeneXpert MRSA Assay (FDA approved for NASAL specimens only), is one component of a  comprehensive MRSA colonization surveillance program. It is not intended to diagnose MRSA infection nor to guide or monitor treatment for MRSA infections. Test performance is not FDA approved in patients less than 73 years old. Performed at [facility], [place] [address] [place], [place] [address]     RADIOLOGY STUDIES/RESULTS: No results found.   LOS: 3 days   Oren Binet, MD  [facility]    To contact the attending provider between 7A-7P or the covering provider during after hours 7P-7A, please log into the web site [email or web address] and access using universal [place] password for that web site. If you do not have the password, please call the hospital operator.  02/21/2021, 1:47 PM

## 2021-02-22 ENCOUNTER — Other Ambulatory Visit (HOSPITAL_COMMUNITY): Payer: Self-pay

## 2021-02-22 DIAGNOSIS — M4628 Osteomyelitis of vertebra, sacral and sacrococcygeal region: Secondary | ICD-10-CM

## 2021-02-22 MED ORDER — LEVOFLOXACIN 750 MG PO TABS
750.0000 mg | ORAL_TABLET | Freq: Every day | ORAL | 0 refills | Status: DC
Start: 2021-02-23 — End: 2021-08-03
  Filled 2021-02-22: qty 2, 2d supply, fill #0

## 2021-02-22 MED ORDER — DOXYCYCLINE HYCLATE 100 MG PO TABS
100.0000 mg | ORAL_TABLET | Freq: Two times a day (BID) | ORAL | 0 refills | Status: DC
  Filled 2021-02-22: qty 4, 2d supply, fill #0

## 2021-02-22 MED ORDER — TRAMADOL HCL 50 MG PO TABS
50.0000 mg | ORAL_TABLET | Freq: Two times a day (BID) | ORAL | 0 refills | Status: AC | PRN
  Filled 2021-02-22: qty 15, 7d supply, fill #0

## 2021-02-22 NOTE — Progress Notes (Signed)
°   02/22/21 1337  AVS Discharge Documentation  AVS Discharge Instructions Including Medications Provided to patient/caregiver  Name of Person Receiving AVS Discharge Instructions Including Medications [name]  Name of Clinician That Reviewed AVS Discharge Instructions Including Medications Clarnce Flock, RN

## 2021-02-22 NOTE — Discharge Summary (Signed)
PATIENT DETAILS Name: [name] Age: 50 y.o. Sex: female Date of Birth: 07-16-1970 MRN: [id number]. Admitting Physician: Collier Bullock, MD [id number], Almyra Free, NP  Admit Date: 02/18/2021 Discharge date: 02/22/2021  Recommendations for Outpatient Follow-up:  Follow up with PCP in 1-2 weeks Please obtain CMP/CBC in one week Please ensure follow-up at the wound care clinic.   Admitted From:  Home  Disposition: Home with home health services   Home Health: Yes  Equipment/Devices: None  Discharge Condition: Stable  CODE STATUS: FULL CODE  Diet recommendation:  Diet Order             Diet general           Diet regular Room service appropriate? Yes; Fluid consistency: Thin  Diet effective now                    Brief Narrative: Patient is a 50 y.o. female with history of advanced MS-paraplegic-bedbound-stage IV sacral decubitus ulcer with underlying chronic osteomyelitis-presented to the hospital on 12/11 with septic shock-source thought to be sacral decubitus ulcer.  Initially admitted to the ICU-required pressors-upon stability-transferred to the [facility] hospitalist service.   Significant events: 12/11>> admitted with septic shock from infected sacral wound-Levophed/empiric Abx started. 12/12>> off pressors 12/13>> transfer to [name] Course: Septic shock due to infected stage IV sacral decubitus ulcer with underlying osteomyelitis of the ischial tuberosity: Sepsis physiology resolved-blood cultures remain negative-recommendations from ID for levofloxacin/doxycycline x7 days (end date 12/17).  Home health services ordered for wound care-patient has an upcoming appointment at the wound care center.   History of advanced MS with paraplegia: Bedbound at baseline-continue baclofen   Nutrition Status: Nutrition Problem: Moderate Malnutrition Etiology: chronic illness (chronic wounds; MS) Signs/Symptoms: percent weight loss, mild fat depletion,  moderate muscle depletion Percent weight loss: 10.5 % Interventions: Ensure Enlive (each supplement provides 350kcal and 20 grams of protein), MVI, Juven   BMI Estimated body mass index is 26.13 kg/m as calculated from the following:   Height as of this encounter: 5\' 2"  (1.575 m).   Weight as of this encounter: 64.8 kg.   RN pressure injury documentation: Pressure Injury 12/14/20 Buttocks Right Stage 4 - Full thickness tissue loss with exposed bone, tendon or muscle. (Active)  12/14/20 0000  Location: Buttocks  Location Orientation: Right  Staging: Stage 4 - Full thickness tissue loss with exposed bone, tendon or muscle.  Wound Description (Comments):   Present on Admission: Yes    Procedures None  Discharge Diagnoses:  Principal Problem:   Sepsis ([place]) Active Problems:   Stage 4 decubitus ulcer ([place])   Sacral osteomyelitis ([facility])   Malnutrition of moderate degree   Discharge Instructions:  Activity:  As tolerated   Discharge Instructions     Call MD for:  redness, tenderness, or signs of infection (pain, swelling, redness, odor or green/yellow discharge around incision site)   Complete by: As directed    Diet general   Complete by: As directed    Discharge instructions   Complete by: As directed    Follow with Primary MD  Alvester Chou, NP in 1-2 weeks  Please get a complete blood count and chemistry panel checked by your Primary MD at your next visit, and again as instructed by your Primary MD.  Get Medicines reviewed and adjusted: Please take all your medications with you for your next visit with your Primary MD  Laboratory/radiological data: Please request your Primary MD to go over all  hospital tests and procedure/radiological results at the follow up, please ask your Primary MD to get all Hospital records sent to his/her office.  In some cases, they will be blood work, cultures and biopsy results pending at the time of your discharge. Please request that your  primary care M.D. follows up on these results.  Also Note the following: If you experience worsening of your admission symptoms, develop shortness of breath, life threatening emergency, suicidal or homicidal thoughts you must seek medical attention immediately by calling 911 or calling your MD immediately  if symptoms less severe.  You must read complete instructions/literature along with all the possible adverse reactions/side effects for all the Medicines you take and that have been prescribed to you. Take any new Medicines after you have completely understood and accpet all the possible adverse reactions/side effects.   Do not drive when taking Pain medications or sleeping medications (Benzodaizepines)  Do not take more than prescribed Pain, Sleep and Anxiety Medications. It is not advisable to combine anxiety,sleep and pain medications without talking with your primary care practitioner  Special Instructions: If you have smoked or chewed Tobacco  in the last 2 yrs please stop smoking, stop any regular Alcohol  and or any Recreational drug use.  Wear Seat belts while driving.  Please note: You were cared for by a hospitalist during your hospital stay. Once you are discharged, your primary care physician will handle any further medical issues. Please note that NO REFILLS for any discharge medications will be authorized once you are discharged, as it is imperative that you return to your primary care physician (or establish a relationship with a primary care physician if you do not have one) for your post hospital discharge needs so that they can reassess your need for medications and monitor your lab values.   Discharge wound care:   Complete by: As directed    1. Continue with a small foam dressing to the left thigh wound. Assess wound daily and change foam dressing every 3 days.  2.Pack wound with calcium alginate Kellie Simmering # [phone]), making sure to pack down into tunneled area at 7 o'clock.  Cover with dry dressing. Change daily   Increase activity slowly   Complete by: As directed       Allergies as of 02/22/2021       Reactions   Silver    Tizanidine Diarrhea   Tomato         Medication List     TAKE these medications    acetaminophen 325 MG tablet Commonly known as: TYLENOL Take 2 tablets (650 mg total) by mouth every 6 (six) hours as needed for mild pain (or Fever >/= 101).   baclofen 20 MG tablet Commonly known as: LIORESAL Take 20 mg by mouth 3 (three) times daily.   carvedilol 3.125 MG tablet Commonly known as: COREG Take 3.125 mg by mouth 2 (two) times daily with a meal.   diclofenac Sodium 1 % Gel Commonly known as: VOLTAREN Apply 4 g topically 4 (four) times daily. Apply to joint or muscle pains What changed:  when to take this reasons to take this additional instructions   doxycycline 100 MG tablet Commonly known as: VIBRA-TABS Take 1 tablet (100 mg total) by mouth every 12 (twelve) hours.   feeding supplement Liqd Take 237 mLs by mouth 2 (two) times daily between meals.   gabapentin 300 MG capsule Commonly known as: NEURONTIN Take 300-600 mg by mouth See admin instructions. 300 mg  morning, noon and dinner and 2 capsules (600mg ) at bedtime.   ibuprofen 200 MG tablet Commonly known as: ADVIL Take 400 mg by mouth every 6 (six) hours as needed for headache or moderate pain.   levofloxacin 750 MG tablet Commonly known as: LEVAQUIN Take 1 tablet (750 mg total) by mouth daily. Start taking on: February 23, 2021   meloxicam 7.5 MG tablet Commonly known as: MOBIC Take 7.5 mg by mouth daily.   multivitamin capsule Take 1 capsule by mouth daily.   polyethylene glycol 17 g packet Commonly known as: MIRALAX / GLYCOLAX Take 17 g by mouth 2 (two) times daily as needed for moderate constipation or mild constipation.   protein supplement shake Liqd Commonly known as: PREMIER PROTEIN Take 2 oz by mouth daily. chocolate    senna-docusate 8.6-50 MG tablet Commonly known as: Senokot-S Take 2 tablets by mouth 2 (two) times daily as needed for mild constipation.   traMADol 50 MG tablet Commonly known as: ULTRAM Take 1 tablet (50 mg total) by mouth every 12 (twelve) hours as needed for moderate pain.   vitamin C 500 MG tablet Commonly known as: ASCORBIC ACID Take 500 mg by mouth daily.   Zinc Oxide 12.8 % ointment Commonly known as: TRIPLE PASTE Apply topically 2 (two) times daily. Apply AROUND sacral wound area. Then follow Dakin's order. What changed:  when to take this reasons to take this additional instructions               Discharge Care Instructions  (From admission, onward)           Start     Ordered   02/22/21 0000  Discharge wound care:       Comments: 1. Continue with a small foam dressing to the left thigh wound. Assess wound daily and change foam dressing every 3 days.  2.Pack wound with calcium alginate Kellie Simmering # [phone]), making sure to pack down into tunneled area at 7 o'clock. Cover with dry dressing. Change daily   02/22/21 1006            Follow-up [facility]              Follow up on 04/03/2021.   Why: You have been put on the cancellation list as a high priority so they will call you for any appointments that may come open before your normally scheduled appointment. Contact information: [address] [phone] [place], Julie, NP. Schedule an appointment as soon as possible for a visit in 1 week(s).   Specialty: Nurse Practitioner Contact information: [facility] [place] [address] [phone]                Allergies  Allergen Reactions   Silver    Tizanidine Diarrhea   Tomato       Consultations:  PCCM, ID   Other Procedures/Studies: CT ABDOMEN PELVIS W CONTRAST  Result  Date: 02/19/2021 CLINICAL DATA:  Abdominal pain, pressure wound on right buttock EXAM: CT ABDOMEN AND PELVIS WITH CONTRAST TECHNIQUE: Multidetector CT imaging of the abdomen and pelvis was performed using the standard protocol following bolus administration of intravenous contrast. CONTRAST:  161mL OMNIPAQUE IOHEXOL 300 MG/ML  SOLN COMPARISON:  MRI pelvis dated 12/12/2020. CT abdomen/pelvis dated 11/12/2020. FINDINGS: Lower chest: Lung bases are clear. Hepatobiliary: Macronodular hepatic contour,  suggesting cirrhosis. No focal hepatic lesion is seen. Gallbladder is unremarkable. No intrahepatic or extrahepatic ductal dilatation. Pancreas: Within normal limits. Spleen: Lobular spleen, although within the upper limits of normal for size. Adrenals/Urinary Tract: Adrenal glands are within normal limits. 2.2 cm right lower pole renal cyst (series 4/image 33), benign. 3 mm nonobstructing left lower pole renal calculus (series 4/image 33). No hydronephrosis. Bladder is within normal limits. Stomach/Bowel: Stomach is within normal limits. No evidence of bowel obstruction. Normal appendix (series 4/image 53). Mild to moderate rectal stool burden. Vascular/Lymphatic: No evidence of abdominal aortic aneurysm. Atherosclerotic calcifications of the abdominal aorta and branch vessels. Perigastric varices.  Portal vein is patent. Reproductive: Uterus is within normal limits. No adnexal masses. Other: No abdominopelvic ascites. Musculoskeletal: Moderate degenerative changes of the visualized thoracolumbar spine. Deep right gluteal ulcer extending to the ischial tuberosity (series 4/image 86), with associated cortical destruction reflecting osteomyelitis (series 4/image 83), better evaluated on prior MRI. No associated abscess. IMPRESSION: Deep right gluteal ulcer extending to the ischial tuberosity with associated osteomyelitis, better evaluated on prior MRI. No associated abscess. Cirrhosis. Perigastric varices. Portal vein is  patent. Spleen is within the upper limits of normal for size. No abdominal ascites. Additional ancillary findings as above. Electronically Signed   By: Charline Bills M.D.   On: 02/19/2021 01:34   DG Chest Port 1 View  Result Date: 02/18/2021 CLINICAL DATA:  Pressure wound on right buttock, possible sepsis EXAM: PORTABLE CHEST 1 VIEW COMPARISON:  01/08/2021 FINDINGS: Mild lower lobe opacities, likely atelectasis. No frank interstitial edema. No pleural effusion or pneumothorax. The heart is normal in size. IMPRESSION: Mild lower lobe atelectasis. Electronically Signed   By: Charline Bills M.D.   On: 02/18/2021 20:13     TODAY-DAY OF DISCHARGE:  Subjective:   [name] today has no headache,no chest abdominal pain,no new weakness tingling or numbness, feels much better wants to go home today.   Objective:   Blood pressure 139/66, pulse 99, temperature 98.3 F (36.8 C), temperature source Oral, resp. rate 19, height 5\' 2"  (1.575 m), weight 64.5 kg, SpO2 96 %.  Intake/Output Summary (Last 24 hours) at 02/22/2021 1006 Last data filed at 02/22/2021 0647 Gross per 24 hour  Intake 240 ml  Output 1175 ml  Net -935 ml   Filed Weights   02/19/21 0300 02/21/21 0500 02/22/21 0500  Weight: 67.9 kg 64.8 kg 64.5 kg    Exam: Awake Alert, Oriented *3, No new F.N deficits, Normal affect [place].AT,PERRAL Supple Neck,No JVD, No cervical lymphadenopathy appriciated.  Symmetrical Chest wall movement, Good air movement bilaterally, CTAB RRR,No Gallops,Rubs or new Murmurs, No Parasternal Heave +ve B.Sounds, Abd Soft, Non tender, No organomegaly appriciated, No rebound -guarding or rigidity. No Cyanosis, Clubbing or edema, No new Rash or bruise   PERTINENT RADIOLOGIC STUDIES: No results found.   PERTINENT LAB RESULTS: CBC: Recent Labs    02/21/21 0056  WBC 4.7  HGB 11.4*  HCT 36.2  PLT 212   CMET CMP     Component Value Date/Time   NA 136 02/21/2021 0056   K 3.8 02/21/2021  0056   CL 101 02/21/2021 0056   CO2 28 02/21/2021 0056   GLUCOSE 102 (H) 02/21/2021 0056   BUN 15 02/21/2021 0056   CREATININE 0.52 02/21/2021 0056   CALCIUM 9.3 02/21/2021 0056   PROT 6.6 02/18/2021 1844   ALBUMIN 2.5 (L) 02/18/2021 1844   AST 26 02/18/2021 1844   ALT 9 02/18/2021 1844   ALKPHOS 89  02/18/2021 1844   BILITOT 0.4 02/18/2021 1844   GFRNONAA >60 02/21/2021 0056   GFRAA >60 02/02/2019 0539    GFR Estimated Creatinine Clearance: 72.6 mL/min (by C-G formula based on SCr of 0.52 mg/dL). No results for input(s): LIPASE, AMYLASE in the last 72 hours. No results for input(s): CKTOTAL, CKMB, CKMBINDEX, TROPONINI in the last 72 hours. Invalid input(s): POCBNP No results for input(s): DDIMER in the last 72 hours. No results for input(s): HGBA1C in the last 72 hours. No results for input(s): CHOL, HDL, LDLCALC, TRIG, CHOLHDL, LDLDIRECT in the last 72 hours. No results for input(s): TSH, T4TOTAL, T3FREE, THYROIDAB in the last 72 hours.  Invalid input(s): FREET3 No results for input(s): VITAMINB12, FOLATE, FERRITIN, TIBC, IRON, RETICCTPCT in the last 72 hours. Coags: No results for input(s): INR in the last 72 hours.  Invalid input(s): PT Microbiology: Recent Results (from the past 240 hour(s))  Blood Culture (routine x 2)     Status: None (Preliminary result)   Collection Time: 02/18/21  6:38 PM   Specimen: BLOOD  Result Value Ref Range Status   Specimen Description BLOOD RIGHT ANTECUBITAL  Final   Special Requests   Final    BOTTLES DRAWN AEROBIC AND ANAEROBIC Blood Culture results may not be optimal due to an inadequate volume of blood received in culture bottles   Culture   Final    NO GROWTH 4 DAYS Performed at [facility], [address], [place], [place] [address]    Report Status PENDING  Incomplete  Blood Culture (routine x 2)     Status: None (Preliminary result)   Collection Time: 02/18/21  8:06 PM   Specimen: BLOOD LEFT HAND  Result Value Ref  Range Status   Specimen Description BLOOD LEFT HAND  Final   Special Requests   Final    BOTTLES DRAWN AEROBIC AND ANAEROBIC Blood Culture results may not be optimal due to an inadequate volume of blood received in culture bottles   Culture   Final    NO GROWTH 4 DAYS Performed at [facility], [address] [place], [place] [address]    Report Status PENDING  Incomplete  Resp Panel by RT-PCR (Flu A&B, Covid) Nasopharyngeal Swab     Status: None   Collection Time: 02/19/21  1:42 AM   Specimen: Nasopharyngeal Swab; Nasopharyngeal(NP) swabs in vial transport medium  Result Value Ref Range Status   SARS Coronavirus 2 by RT PCR NEGATIVE NEGATIVE Final    Comment: (NOTE) SARS-CoV-2 target nucleic acids are NOT DETECTED.  The SARS-CoV-2 RNA is generally detectable in upper respiratory specimens during the acute phase of infection. The lowest concentration of SARS-CoV-2 viral copies this assay can detect is 138 copies/mL. A negative result does not preclude SARS-Cov-2 infection and should not be used as the sole basis for treatment or other patient management decisions. A negative result may occur with  improper specimen collection/handling, submission of specimen other than nasopharyngeal swab, presence of viral mutation(s) within the areas targeted by this assay, and inadequate number of viral copies(<138 copies/mL). A negative result must be combined with clinical observations, patient history, and epidemiological information. The expected result is Negative.  Fact Sheet for Patients:  BloggerCourse.com  Fact Sheet for Healthcare Providers:  SeriousBroker.it  This test is no t yet approved or cleared by the [place] FDA and  has been authorized for detection and/or diagnosis of SARS-CoV-2 by FDA under an Emergency Use Authorization (EUA). This EUA will remain  in effect (meaning this test can be used) for the duration of  the COVID-19 declaration under Section 564(b)(1) of the Act, 21 U.S.C.section 360bbb-3(b)(1), unless the authorization is terminated  or revoked sooner.       Influenza A by PCR NEGATIVE NEGATIVE Final   Influenza B by PCR NEGATIVE NEGATIVE Final    Comment: (NOTE) The Xpert Xpress SARS-CoV-2/FLU/RSV plus assay is intended as an aid in the diagnosis of influenza from Nasopharyngeal swab specimens and should not be used as a sole basis for treatment. Nasal washings and aspirates are unacceptable for Xpert Xpress SARS-CoV-2/FLU/RSV testing.  Fact Sheet for Patients: EntrepreneurPulse.com.au  Fact Sheet for Healthcare Providers: IncredibleEmployment.be  This test is not yet approved or cleared by the [place] FDA and has been authorized for detection and/or diagnosis of SARS-CoV-2 by FDA under an Emergency Use Authorization (EUA). This EUA will remain in effect (meaning this test can be used) for the duration of the COVID-19 declaration under Section 564(b)(1) of the Act, 21 U.S.C. section 360bbb-3(b)(1), unless the authorization is terminated or revoked.  Performed at [facility], [place] [address] [place], [place] [address]   MRSA Next Gen by PCR, Nasal     Status: None   Collection Time: 02/19/21  2:51 AM   Specimen: Nasal Mucosa; Nasal Swab  Result Value Ref Range Status   MRSA by PCR Next Gen NOT DETECTED NOT DETECTED Final    Comment: (NOTE) The GeneXpert MRSA Assay (FDA approved for NASAL specimens only), is one component of a comprehensive MRSA colonization surveillance program. It is not intended to diagnose MRSA infection nor to guide or monitor treatment for MRSA infections. Test performance is not FDA approved in patients less than 81 years old. Performed at [facility], [place] [address] [place], [place] [address]     FURTHER DISCHARGE INSTRUCTIONS:  Get Medicines reviewed and adjusted: Please take all  your medications with you for your next visit with your Primary MD  Laboratory/radiological data: Please request your Primary MD to go over all hospital tests and procedure/radiological results at the follow up, please ask your Primary MD to get all Hospital records sent to his/her office.  In some cases, they will be blood work, cultures and biopsy results pending at the time of your discharge. Please request that your primary care M.D. goes through all the records of your hospital data and follows up on these results.  Also Note the following: If you experience worsening of your admission symptoms, develop shortness of breath, life threatening emergency, suicidal or homicidal thoughts you must seek medical attention immediately by calling 911 or calling your MD immediately  if symptoms less severe.  You must read complete instructions/literature along with all the possible adverse reactions/side effects for all the Medicines you take and that have been prescribed to you. Take any new Medicines after you have completely understood and accpet all the possible adverse reactions/side effects.   Do not drive when taking Pain medications or sleeping medications (Benzodaizepines)  Do not take more than prescribed Pain, Sleep and Anxiety Medications. It is not advisable to combine anxiety,sleep and pain medications without talking with your primary care practitioner  Special Instructions: If you have smoked or chewed Tobacco  in the last 2 yrs please stop smoking, stop any regular Alcohol  and or any Recreational drug use.  Wear Seat belts while driving.  Please note: You were cared for by a hospitalist during your hospital stay. Once you  are discharged, your primary care physician will handle any further medical issues. Please note that NO REFILLS for any discharge medications will be authorized once you are discharged, as it is imperative that you return to your primary care physician (or establish  a relationship with a primary care physician if you do not have one) for your post hospital discharge needs so that they can reassess your need for medications and monitor your lab values.  Total Time spent coordinating discharge including counseling, education and face to face time equals 35 minutes.  SignedOren Binet 02/22/2021 10:06 AM

## 2021-02-22 NOTE — TOC Transition Note (Signed)
Transition of Care [facility]) - CM/SW Discharge Note   Patient Details  Name: [name] MRN: [id number] Date of Birth: 1970/05/12  Transition of Care [facility]) CM/SW Contact:  Harriet Masson, RN Phone Number: 02/22/2021, 11:57 AM   Clinical Narrative:    Patient stable for discharge. HH-RN ordered. Patient is active with [facility] and would like to continue. Kandee Keen with [facility] notified  Patient states mother is home. [facility] called to transport home.  Final next level of care: Home w Home Health Services Barriers to Discharge: Barriers Resolved   Patient Goals and CMS Choice Patient states their goals for this hospitalization and ongoing recovery are:: return home CMS Medicare.gov Compare Post Acute Care list provided to:: Patient Choice offered to / list presented to : Patient p Discharge Placement  home                     Discharge Plan and Services   Discharge Planning Services: CM Consult Post Acute Care Choice: Home Health                      [facility] Agency: [facility] Date [facility] Agency Contacted: 02/22/21 Time HH Agency Contacted: 1151 Representative spoke with at [facility] Agency: Kandee Keen  Social Determinants of Health (SDOH) Interventions     Readmission Risk Interventions Readmission Risk Prevention Plan 02/22/2021  Post Dischage Appt Complete  Medication Screening Complete  Transportation Screening Complete  Some recent data might be hidden

## 2021-02-23 DIAGNOSIS — G629 Polyneuropathy, unspecified: Secondary | ICD-10-CM | POA: Diagnosis not present

## 2021-02-23 DIAGNOSIS — N8184 Pelvic muscle wasting: Secondary | ICD-10-CM | POA: Diagnosis not present

## 2021-02-23 DIAGNOSIS — L89314 Pressure ulcer of right buttock, stage 4: Secondary | ICD-10-CM | POA: Diagnosis not present

## 2021-02-23 DIAGNOSIS — G8929 Other chronic pain: Secondary | ICD-10-CM | POA: Diagnosis not present

## 2021-02-23 DIAGNOSIS — I959 Hypotension, unspecified: Secondary | ICD-10-CM | POA: Diagnosis not present

## 2021-02-23 DIAGNOSIS — G8220 Paraplegia, unspecified: Secondary | ICD-10-CM | POA: Diagnosis not present

## 2021-02-23 DIAGNOSIS — I10 Essential (primary) hypertension: Secondary | ICD-10-CM | POA: Diagnosis not present

## 2021-02-23 DIAGNOSIS — G35 Multiple sclerosis: Secondary | ICD-10-CM | POA: Diagnosis not present

## 2021-02-23 DIAGNOSIS — L89312 Pressure ulcer of right buttock, stage 2: Secondary | ICD-10-CM | POA: Diagnosis not present

## 2021-02-23 LAB — CULTURE, BLOOD (ROUTINE X 2)
Culture: NO GROWTH
Culture: NO GROWTH

## 2021-02-27 DIAGNOSIS — G35 Multiple sclerosis: Secondary | ICD-10-CM | POA: Diagnosis not present

## 2021-02-27 DIAGNOSIS — I10 Essential (primary) hypertension: Secondary | ICD-10-CM | POA: Diagnosis not present

## 2021-02-27 DIAGNOSIS — I959 Hypotension, unspecified: Secondary | ICD-10-CM | POA: Diagnosis not present

## 2021-02-27 DIAGNOSIS — G629 Polyneuropathy, unspecified: Secondary | ICD-10-CM | POA: Diagnosis not present

## 2021-02-27 DIAGNOSIS — G8220 Paraplegia, unspecified: Secondary | ICD-10-CM | POA: Diagnosis not present

## 2021-02-27 DIAGNOSIS — L89312 Pressure ulcer of right buttock, stage 2: Secondary | ICD-10-CM | POA: Diagnosis not present

## 2021-02-27 DIAGNOSIS — G8929 Other chronic pain: Secondary | ICD-10-CM | POA: Diagnosis not present

## 2021-02-27 DIAGNOSIS — L89314 Pressure ulcer of right buttock, stage 4: Secondary | ICD-10-CM | POA: Diagnosis not present

## 2021-02-27 DIAGNOSIS — N8184 Pelvic muscle wasting: Secondary | ICD-10-CM | POA: Diagnosis not present

## 2021-03-02 DIAGNOSIS — I10 Essential (primary) hypertension: Secondary | ICD-10-CM | POA: Diagnosis not present

## 2021-03-02 DIAGNOSIS — L89314 Pressure ulcer of right buttock, stage 4: Secondary | ICD-10-CM | POA: Diagnosis not present

## 2021-03-02 DIAGNOSIS — M4628 Osteomyelitis of vertebra, sacral and sacrococcygeal region: Secondary | ICD-10-CM | POA: Diagnosis not present

## 2021-03-02 DIAGNOSIS — G8220 Paraplegia, unspecified: Secondary | ICD-10-CM | POA: Diagnosis not present

## 2021-03-02 DIAGNOSIS — I700 Atherosclerosis of aorta: Secondary | ICD-10-CM | POA: Diagnosis not present

## 2021-03-02 DIAGNOSIS — G35 Multiple sclerosis: Secondary | ICD-10-CM | POA: Diagnosis not present

## 2021-03-02 DIAGNOSIS — N179 Acute kidney failure, unspecified: Secondary | ICD-10-CM | POA: Diagnosis not present

## 2021-03-02 DIAGNOSIS — E440 Moderate protein-calorie malnutrition: Secondary | ICD-10-CM | POA: Diagnosis not present

## 2021-03-02 DIAGNOSIS — L89312 Pressure ulcer of right buttock, stage 2: Secondary | ICD-10-CM | POA: Diagnosis not present

## 2021-03-06 DIAGNOSIS — G8220 Paraplegia, unspecified: Secondary | ICD-10-CM | POA: Diagnosis not present

## 2021-03-06 DIAGNOSIS — L89312 Pressure ulcer of right buttock, stage 2: Secondary | ICD-10-CM | POA: Diagnosis not present

## 2021-03-06 DIAGNOSIS — I700 Atherosclerosis of aorta: Secondary | ICD-10-CM | POA: Diagnosis not present

## 2021-03-06 DIAGNOSIS — G35 Multiple sclerosis: Secondary | ICD-10-CM | POA: Diagnosis not present

## 2021-03-06 DIAGNOSIS — E440 Moderate protein-calorie malnutrition: Secondary | ICD-10-CM | POA: Diagnosis not present

## 2021-03-06 DIAGNOSIS — N179 Acute kidney failure, unspecified: Secondary | ICD-10-CM | POA: Diagnosis not present

## 2021-03-06 DIAGNOSIS — I10 Essential (primary) hypertension: Secondary | ICD-10-CM | POA: Diagnosis not present

## 2021-03-06 DIAGNOSIS — M4628 Osteomyelitis of vertebra, sacral and sacrococcygeal region: Secondary | ICD-10-CM | POA: Diagnosis not present

## 2021-03-06 DIAGNOSIS — L89314 Pressure ulcer of right buttock, stage 4: Secondary | ICD-10-CM | POA: Diagnosis not present

## 2021-03-09 DIAGNOSIS — M4628 Osteomyelitis of vertebra, sacral and sacrococcygeal region: Secondary | ICD-10-CM | POA: Diagnosis not present

## 2021-03-09 DIAGNOSIS — G8220 Paraplegia, unspecified: Secondary | ICD-10-CM | POA: Diagnosis not present

## 2021-03-09 DIAGNOSIS — I700 Atherosclerosis of aorta: Secondary | ICD-10-CM | POA: Diagnosis not present

## 2021-03-09 DIAGNOSIS — Z7189 Other specified counseling: Secondary | ICD-10-CM | POA: Diagnosis not present

## 2021-03-09 DIAGNOSIS — L89312 Pressure ulcer of right buttock, stage 2: Secondary | ICD-10-CM | POA: Diagnosis not present

## 2021-03-09 DIAGNOSIS — N179 Acute kidney failure, unspecified: Secondary | ICD-10-CM | POA: Diagnosis not present

## 2021-03-09 DIAGNOSIS — E440 Moderate protein-calorie malnutrition: Secondary | ICD-10-CM | POA: Diagnosis not present

## 2021-03-09 DIAGNOSIS — L89314 Pressure ulcer of right buttock, stage 4: Secondary | ICD-10-CM | POA: Diagnosis not present

## 2021-03-09 DIAGNOSIS — G35 Multiple sclerosis: Secondary | ICD-10-CM | POA: Diagnosis not present

## 2021-03-09 DIAGNOSIS — I10 Essential (primary) hypertension: Secondary | ICD-10-CM | POA: Diagnosis not present

## 2021-03-12 DIAGNOSIS — I700 Atherosclerosis of aorta: Secondary | ICD-10-CM | POA: Diagnosis not present

## 2021-03-12 DIAGNOSIS — L89312 Pressure ulcer of right buttock, stage 2: Secondary | ICD-10-CM | POA: Diagnosis not present

## 2021-03-12 DIAGNOSIS — E440 Moderate protein-calorie malnutrition: Secondary | ICD-10-CM | POA: Diagnosis not present

## 2021-03-12 DIAGNOSIS — G35 Multiple sclerosis: Secondary | ICD-10-CM | POA: Diagnosis not present

## 2021-03-12 DIAGNOSIS — L89314 Pressure ulcer of right buttock, stage 4: Secondary | ICD-10-CM | POA: Diagnosis not present

## 2021-03-12 DIAGNOSIS — M4628 Osteomyelitis of vertebra, sacral and sacrococcygeal region: Secondary | ICD-10-CM | POA: Diagnosis not present

## 2021-03-12 DIAGNOSIS — I10 Essential (primary) hypertension: Secondary | ICD-10-CM | POA: Diagnosis not present

## 2021-03-12 DIAGNOSIS — N179 Acute kidney failure, unspecified: Secondary | ICD-10-CM | POA: Diagnosis not present

## 2021-03-12 DIAGNOSIS — G8220 Paraplegia, unspecified: Secondary | ICD-10-CM | POA: Diagnosis not present

## 2021-03-16 DIAGNOSIS — L89314 Pressure ulcer of right buttock, stage 4: Secondary | ICD-10-CM | POA: Diagnosis not present

## 2021-03-16 DIAGNOSIS — N179 Acute kidney failure, unspecified: Secondary | ICD-10-CM | POA: Diagnosis not present

## 2021-03-16 DIAGNOSIS — I10 Essential (primary) hypertension: Secondary | ICD-10-CM | POA: Diagnosis not present

## 2021-03-16 DIAGNOSIS — M4628 Osteomyelitis of vertebra, sacral and sacrococcygeal region: Secondary | ICD-10-CM | POA: Diagnosis not present

## 2021-03-16 DIAGNOSIS — L89312 Pressure ulcer of right buttock, stage 2: Secondary | ICD-10-CM | POA: Diagnosis not present

## 2021-03-16 DIAGNOSIS — E440 Moderate protein-calorie malnutrition: Secondary | ICD-10-CM | POA: Diagnosis not present

## 2021-03-16 DIAGNOSIS — G35 Multiple sclerosis: Secondary | ICD-10-CM | POA: Diagnosis not present

## 2021-03-16 DIAGNOSIS — G8220 Paraplegia, unspecified: Secondary | ICD-10-CM | POA: Diagnosis not present

## 2021-03-16 DIAGNOSIS — I700 Atherosclerosis of aorta: Secondary | ICD-10-CM | POA: Diagnosis not present

## 2021-03-20 DIAGNOSIS — N179 Acute kidney failure, unspecified: Secondary | ICD-10-CM | POA: Diagnosis not present

## 2021-03-20 DIAGNOSIS — L89314 Pressure ulcer of right buttock, stage 4: Secondary | ICD-10-CM | POA: Diagnosis not present

## 2021-03-20 DIAGNOSIS — E440 Moderate protein-calorie malnutrition: Secondary | ICD-10-CM | POA: Diagnosis not present

## 2021-03-20 DIAGNOSIS — I700 Atherosclerosis of aorta: Secondary | ICD-10-CM | POA: Diagnosis not present

## 2021-03-20 DIAGNOSIS — G8220 Paraplegia, unspecified: Secondary | ICD-10-CM | POA: Diagnosis not present

## 2021-03-20 DIAGNOSIS — L89312 Pressure ulcer of right buttock, stage 2: Secondary | ICD-10-CM | POA: Diagnosis not present

## 2021-03-20 DIAGNOSIS — M4628 Osteomyelitis of vertebra, sacral and sacrococcygeal region: Secondary | ICD-10-CM | POA: Diagnosis not present

## 2021-03-20 DIAGNOSIS — G35 Multiple sclerosis: Secondary | ICD-10-CM | POA: Diagnosis not present

## 2021-03-20 DIAGNOSIS — I10 Essential (primary) hypertension: Secondary | ICD-10-CM | POA: Diagnosis not present

## 2021-03-22 DIAGNOSIS — L89314 Pressure ulcer of right buttock, stage 4: Secondary | ICD-10-CM | POA: Diagnosis not present

## 2021-03-22 DIAGNOSIS — M4628 Osteomyelitis of vertebra, sacral and sacrococcygeal region: Secondary | ICD-10-CM | POA: Diagnosis not present

## 2021-03-22 DIAGNOSIS — E440 Moderate protein-calorie malnutrition: Secondary | ICD-10-CM | POA: Diagnosis not present

## 2021-03-22 DIAGNOSIS — I10 Essential (primary) hypertension: Secondary | ICD-10-CM | POA: Diagnosis not present

## 2021-03-22 DIAGNOSIS — N179 Acute kidney failure, unspecified: Secondary | ICD-10-CM | POA: Diagnosis not present

## 2021-03-22 DIAGNOSIS — L89312 Pressure ulcer of right buttock, stage 2: Secondary | ICD-10-CM | POA: Diagnosis not present

## 2021-03-22 DIAGNOSIS — G35 Multiple sclerosis: Secondary | ICD-10-CM | POA: Diagnosis not present

## 2021-03-22 DIAGNOSIS — I700 Atherosclerosis of aorta: Secondary | ICD-10-CM | POA: Diagnosis not present

## 2021-03-22 DIAGNOSIS — G8220 Paraplegia, unspecified: Secondary | ICD-10-CM | POA: Diagnosis not present

## 2021-03-26 ENCOUNTER — Other Ambulatory Visit: Payer: Self-pay

## 2021-03-26 ENCOUNTER — Encounter (HOSPITAL_BASED_OUTPATIENT_CLINIC_OR_DEPARTMENT_OTHER): Payer: Medicare PPO | Attending: Internal Medicine | Admitting: Internal Medicine

## 2021-03-26 DIAGNOSIS — L89314 Pressure ulcer of right buttock, stage 4: Secondary | ICD-10-CM | POA: Diagnosis not present

## 2021-03-26 DIAGNOSIS — M4628 Osteomyelitis of vertebra, sacral and sacrococcygeal region: Secondary | ICD-10-CM | POA: Diagnosis not present

## 2021-03-26 DIAGNOSIS — Z7401 Bed confinement status: Secondary | ICD-10-CM | POA: Diagnosis not present

## 2021-03-26 DIAGNOSIS — Z87891 Personal history of nicotine dependence: Secondary | ICD-10-CM | POA: Diagnosis not present

## 2021-03-26 DIAGNOSIS — G8220 Paraplegia, unspecified: Secondary | ICD-10-CM | POA: Diagnosis not present

## 2021-03-26 DIAGNOSIS — G35 Multiple sclerosis: Secondary | ICD-10-CM | POA: Insufficient documentation

## 2021-03-26 DIAGNOSIS — I10 Essential (primary) hypertension: Secondary | ICD-10-CM | POA: Insufficient documentation

## 2021-03-26 DIAGNOSIS — R531 Weakness: Secondary | ICD-10-CM | POA: Diagnosis not present

## 2021-03-27 DIAGNOSIS — I10 Essential (primary) hypertension: Secondary | ICD-10-CM | POA: Diagnosis not present

## 2021-03-27 DIAGNOSIS — L89312 Pressure ulcer of right buttock, stage 2: Secondary | ICD-10-CM | POA: Diagnosis not present

## 2021-03-27 DIAGNOSIS — E440 Moderate protein-calorie malnutrition: Secondary | ICD-10-CM | POA: Diagnosis not present

## 2021-03-27 DIAGNOSIS — M4628 Osteomyelitis of vertebra, sacral and sacrococcygeal region: Secondary | ICD-10-CM | POA: Diagnosis not present

## 2021-03-27 DIAGNOSIS — N179 Acute kidney failure, unspecified: Secondary | ICD-10-CM | POA: Diagnosis not present

## 2021-03-27 DIAGNOSIS — G8220 Paraplegia, unspecified: Secondary | ICD-10-CM | POA: Diagnosis not present

## 2021-03-27 DIAGNOSIS — L89314 Pressure ulcer of right buttock, stage 4: Secondary | ICD-10-CM | POA: Diagnosis not present

## 2021-03-27 DIAGNOSIS — I700 Atherosclerosis of aorta: Secondary | ICD-10-CM | POA: Diagnosis not present

## 2021-03-27 DIAGNOSIS — G35 Multiple sclerosis: Secondary | ICD-10-CM | POA: Diagnosis not present

## 2021-03-28 NOTE — Progress Notes (Signed)
[name], [name] ([name]) Visit Report for 03/26/2021 Chief Complaint Document Details Patient Name: Date of Service: [name], [name] 03/26/2021 9:00 A M Medical Record Number: [name] Patient Account Number: [id number] Date of Birth/Sex: Treating RN: [birth year] (51 y.o. [name] Primary Care Provider: Alvester Chou Other Clinician: Referring Provider: Treating Provider/Extender: Haydee Salter in Treatment: 0 Information Obtained from: Patient Chief Complaint Right gluteus wound Electronic Signature(s) Signed: 03/26/2021 11:54:18 AM By: Kalman Shan DO Entered By: Kalman Shan on 03/26/2021 11:22:29 -------------------------------------------------------------------------------- HPI Details Patient Name: Date of Service: [name], [name] 03/26/2021 9:00 A M Medical Record Number: [name] Patient Account Number: [id number] Date of Birth/Sex: Treating RN: December 22, 1970 (51 y.o. [name] Primary Care Provider: Alvester Chou Other Clinician: Referring Provider: Treating Provider/Extender: Haydee Salter in Treatment: 0 History of Present Illness HPI Description: Admission 03/26/2021 [name] is a 51 year old female with a past medical history of multiple sclerosis for the past 16 years, complicated by severe debility and bedbound since 2015 that presents to the clinic for a 34-month history of ulcer to the right buttocks. She has been admitted to the hospital several times for this issue. She was last hospitalized on 02/18/2021 for septic shock due to the infected ulcer. She was evaluated by infectious disease at that time. She completed a 2- week course of antibiotics. There is no plan for follow-up. She is currently using calcium alginate dressings. She has home health that comes out twice weekly. She currently denies signs of infection. She has an air mattress for offloading. Of note She has a history of  osteomyelitis to the sacrum and treated in 2020 with IV antibiotics for 8 weeks. Electronic Signature(s) Signed: 03/26/2021 11:54:18 AM By: Kalman Shan DO Entered By: Kalman Shan on 03/26/2021 11:28:46 -------------------------------------------------------------------------------- Physical Exam Details Patient Name: Date of Service: [name], [name] 03/26/2021 9:00 A M Medical Record Number: [name] Patient Account Number: [id number] Date of Birth/Sex: Treating RN: 1970/07/29 (51 y.o. [name] Primary Care Provider: Other Clinician: Alvester Chou Referring Provider: Treating Provider/Extender: Haydee Salter in Treatment: 0 Constitutional respirations regular, non-labored and within target range for patient.Marland Kitchen Psychiatric pleasant and cooperative. Notes Right buttocks: Open wound with granulation tissue and undermining. No surrounding signs of infection. Electronic Signature(s) Signed: 03/26/2021 11:54:18 AM By: Kalman Shan DO Entered By: Kalman Shan on 03/26/2021 11:29:23 -------------------------------------------------------------------------------- Physician Orders Details Patient Name: Date of Service: [name], [name] 03/26/2021 9:00 A M Medical Record Number: [name] Patient Account Number: [id number] Date of Birth/Sex: Treating RN: November 08, 1970 (51 y.o. [name] Primary Care Provider: Alvester Chou Other Clinician: Referring Provider: Treating Provider/Extender: Haydee Salter in Treatment: 0 Verbal / Phone Orders: No Diagnosis Coding ICD-10 Coding Code Description L89.314 Pressure ulcer of right buttock, stage 4 G35 Multiple sclerosis M46.28 Osteomyelitis of vertebra, sacral and sacrococcygeal region Follow-up Appointments ppointment in: - 6 weeks with Dr. Heber [place] ****STRETCHER**** Return A Bathing/ Shower/ Hygiene May shower with protection but do not get wound dressing(s)  wet. Off-Loading Low air-loss mattress (Group 2) Turn and reposition every 2 hours Home Health No change in wound care orders this week; continue Home Health for wound care. May utilize formulary equivalent dressing for wound treatment orders unless otherwise specified. Other Home Health Orders/Instructions: - [facility] Wound Treatment Wound #1 - Gluteus Wound Laterality: Right Cleanser: Vashe, 8.5 (oz) (Home Health) Every Other Day/30 Days Discharge Instructions: Clean wound with Vashe  or Anasept Peri-Wound Care: Skin Prep [facility]) Every Other Day/30 Days Discharge Instructions: Use skin prep as directed Prim Dressing: Maxorb Extra Calcium Alginate Dressing, 4x4 in [facility]) Every Other Day/30 Days ary Discharge Instructions: Apply calcium alginate to wound bed as instructed Secondary Dressing: Zetuvit Plus Silicone Border Dressing 5x5 (in/in) ([place]) Every Other Day/30 Days Discharge Instructions: Apply silicone border over primary dressing as directed. Electronic Signature(s) Signed: 03/26/2021 11:54:18 AM By: Kalman Shan DO Entered By: Kalman Shan on 03/26/2021 11:29:36 -------------------------------------------------------------------------------- Problem List Details Patient Name: Date of Service: [name], [name] 03/26/2021 9:00 A M Medical Record Number: [name] Patient Account Number: [id number] Date of Birth/Sex: Treating RN: Jul 25, 1970 (51 y.o. [name] Primary Care Provider: Alvester Chou Other Clinician: Referring Provider: Treating Provider/Extender: Haydee Salter in Treatment: 0 Active Problems ICD-10 Encounter Code Description Active Date MDM Diagnosis L89.314 Pressure ulcer of right buttock, stage 4 03/26/2021 No Yes G35 Multiple sclerosis 03/26/2021 No Yes M46.28 Osteomyelitis of vertebra, sacral and sacrococcygeal region 03/26/2021 No Yes Inactive Problems Resolved Problems Electronic  Signature(s) Signed: 03/26/2021 11:54:18 AM By: Kalman Shan DO Entered By: Kalman Shan on 03/26/2021 11:21:46 -------------------------------------------------------------------------------- Progress Note Details Patient Name: Date of Service: [name], [name] 03/26/2021 9:00 A M Medical Record Number: [name] Patient Account Number: [id number] Date of Birth/Sex: Treating RN: [birth year] (51 y.o. [name] Primary Care Provider: Alvester Chou Other Clinician: Referring Provider: Treating Provider/Extender: Haydee Salter in Treatment: 0 Subjective Chief Complaint Information obtained from Patient Right gluteus wound History of Present Illness (HPI) Admission 03/26/2021 Ms. Sausha Zappia is a 51 year old female with a past medical history of multiple sclerosis for the past 16 years, complicated by severe debility and bedbound since 2015 that presents to the clinic for a 23-month history of ulcer to the right buttocks. She has been admitted to the hospital several times for this issue. She was last hospitalized on 02/18/2021 for septic shock due to the infected ulcer. She was evaluated by infectious disease at that time. She completed a 2- week course of antibiotics. There is no plan for follow-up. She is currently using calcium alginate dressings. She has home health that comes out twice weekly. She currently denies signs of infection. She has an air mattress for offloading. Of note She has a history of osteomyelitis to the sacrum and treated in 2020 with IV antibiotics for 8 weeks. Patient History Information obtained from Patient. Allergies silver (Reaction: bleeding), tizanidine (Reaction: diarrhea), tomato Family History Unknown History. Social History Former smoker, Marital Status - Single, Alcohol Use - Never, Drug Use - No History, Caffeine Use - Rarely. Medical History Cardiovascular Patient has history of Hypertension Denies history  of Deep Vein Thrombosis Integumentary (Skin) Denies history of History of Burn Musculoskeletal Patient has history of Osteomyelitis - right ischial tuberosity Neurologic Patient has history of Paraplegia Medical A Surgical History Notes nd Neurologic Multiple Sclerosis Review of Systems (ROS) Constitutional Symptoms (General Health) Denies complaints or symptoms of Fatigue, Fever, Chills, Marked Weight Change. Eyes Denies complaints or symptoms of Dry Eyes, Vision Changes, Glasses / Contacts. Ear/Nose/Mouth/Throat Denies complaints or symptoms of Chronic sinus problems or rhinitis. Respiratory Denies complaints or symptoms of Chronic or frequent coughs, Shortness of Breath. Gastrointestinal Denies complaints or symptoms of Frequent diarrhea, Nausea, Vomiting. Endocrine Denies complaints or symptoms of Heat/cold intolerance. Genitourinary Denies complaints or symptoms of Frequent urination. Integumentary (Skin) Complains or has symptoms of Wounds - wound on right gluteus.  Psychiatric Denies complaints or symptoms of Claustrophobia, Suicidal. Objective Constitutional respirations regular, non-labored and within target range for patient.. Vitals Time Taken: 8:54 AM, Height: 62 in, Source: Stated, Weight: 145 lbs, Source: Stated, BMI: 26.5, Temperature: 97.9 F, Pulse: 67 bpm, Respiratory Rate: 18 breaths/min, Blood Pressure: 105/71 mmHg. Psychiatric pleasant and cooperative. General Notes: Right buttocks: Open wound with granulation tissue and undermining. No surrounding signs of infection. Integumentary (Hair, Skin) Wound #1 status is Open. Original cause of wound was Pressure Injury. The date acquired was: 12/09/2020. The wound is located on the Right Gluteus. The wound measures 5cm length x 4.5cm width x 4.8cm depth; 17.671cm^2 area and 84.823cm^3 volume. There is Fat Layer (Subcutaneous Tissue) exposed. There is no tunneling noted, however, there is undermining starting at  1:00 and ending at 4:00 with a maximum distance of 5.2cm. There is a medium amount of serosanguineous drainage noted. The wound margin is well defined and not attached to the wound base. There is large (67-100%) red granulation within the wound bed. There is a small (1-33%) amount of necrotic tissue within the wound bed including Adherent Slough. Assessment Active Problems ICD-10 Pressure ulcer of right buttock, stage 4 Multiple sclerosis Osteomyelitis of vertebra, sacral and sacrococcygeal region Patient presents with a 60-month history of nonhealing ulcer to the right buttocks secondary to chronic osteomyelitis. Currently there are no surrounding signs of infection. Patient is using calcium alginate and would like to continue with this dressing. I recommended using Vashe or anasept to clean the area first and then use the dressing. I recommended aggressive offloading. Patient travels by ambulance and due to cost would like to come every 6 to 8 weeks. She knows to call with any questions or concerns and can be seen sooner. Follow-up in 6 weeks. 47 minutes was spent on the encounter including face-to-face, EMR review and coordination of care Plan Follow-up Appointments: Return Appointment in: - 6 weeks with Dr. Heber [place] ****STRETCHER**** Bathing/ Shower/ Hygiene: May shower with protection but do not get wound dressing(s) wet. Off-Loading: Low air-loss mattress (Group 2) Turn and reposition every 2 hours Home Health: No change in wound care orders this week; continue Home Health for wound care. May utilize formulary equivalent dressing for wound treatment orders unless otherwise specified. Other Home Health Orders/Instructions: - [facility] WOUND #1: - Gluteus Wound Laterality: Right Cleanser: Vashe, 8.5 (oz) (Home Health) Every Other Day/30 Days Discharge Instructions: Clean wound with Vashe or Anasept Peri-Wound Care: Skin Prep ([place]) Every Other Day/30 Days Discharge Instructions:  Use skin prep as directed Prim Dressing: Maxorb Extra Calcium Alginate Dressing, 4x4 in [facility]) Every Other Day/30 Days ary Discharge Instructions: Apply calcium alginate to wound bed as instructed Secondary Dressing: Zetuvit Plus Silicone Border Dressing 5x5 (in/in) (Home Health) Every Other Day/30 Days Discharge Instructions: Apply silicone border over primary dressing as directed. 1. Aggressive offloading 2. Calcium alginate 3. Use Vashe or antiseptic clean the wound 4. Follow-up in 6 weeks Electronic Signature(s) Signed: 03/26/2021 11:54:18 AM By: Kalman Shan DO Entered By: Kalman Shan on 03/26/2021 11:33:07 -------------------------------------------------------------------------------- HxROS Details Patient Name: Date of Service: [name], Alisson L. 03/26/2021 9:00 A M Medical Record Number: [id number] Patient Account Number: [id number] Date of Birth/Sex: Treating RN: 1971-02-09 (51 y.o. [name] Primary Care Provider: Alvester Chou Other Clinician: Referring Provider: Treating Provider/Extender: Haydee Salter in Treatment: 0 Information Obtained From Patient Constitutional Symptoms (General Health) Complaints and Symptoms: Negative for: Fatigue; Fever; Chills; Marked Weight Change Eyes Complaints and  Symptoms: Negative for: Dry Eyes; Vision Changes; Glasses / Contacts Ear/Nose/Mouth/Throat Complaints and Symptoms: Negative for: Chronic sinus problems or rhinitis Respiratory Complaints and Symptoms: Negative for: Chronic or frequent coughs; Shortness of Breath Gastrointestinal Complaints and Symptoms: Negative for: Frequent diarrhea; Nausea; Vomiting Endocrine Complaints and Symptoms: Negative for: Heat/cold intolerance Genitourinary Complaints and Symptoms: Negative for: Frequent urination Integumentary (Skin) Complaints and Symptoms: Positive for: Wounds - wound on right gluteus Medical History: Negative for:  History of Burn Psychiatric Complaints and Symptoms: Negative for: Claustrophobia; Suicidal Hematologic/Lymphatic Cardiovascular Medical History: Positive for: Hypertension Negative for: Deep Vein Thrombosis Immunological Musculoskeletal Medical History: Positive for: Osteomyelitis - right ischial tuberosity Neurologic Medical History: Positive for: Paraplegia Past Medical History Notes: Multiple Sclerosis Oncologic Immunizations Pneumococcal Vaccine: Received Pneumococcal Vaccination: No Implantable Devices Yes Family and Social History Unknown History: Yes; Former smoker; Marital Status - Single; Alcohol Use: Never; Drug Use: No History; Caffeine Use: Rarely; Financial Concerns: No; Food, Clothing or Shelter Needs: No; Support System Lacking: No; Transportation Concerns: No Electronic Signature(s) Signed: 03/26/2021 11:54:18 AM By: Kalman Shan DO Signed: 03/28/2021 5:10:09 PM By: Levan Hurst RN, BSN Entered By: Levan Hurst on 03/26/2021 09:14:25 -------------------------------------------------------------------------------- SuperBill Details Patient Name: Date of Service: [name], Shaquasia L. 03/26/2021 Medical Record Number: [name] Patient Account Number: [id number] Date of Birth/Sex: Treating RN: 12-05-1970 (51 y.o. [name] Primary Care Provider: Alvester Chou Other Clinician: Referring Provider: Treating Provider/Extender: Haydee Salter in Treatment: 0 Diagnosis Coding ICD-10 Codes Code Description [phone] Pressure ulcer of right buttock, stage 4 G35 Multiple sclerosis M46.28 Osteomyelitis of vertebra, sacral and sacrococcygeal region Facility Procedures CPT4 Code: [id number] Description: [facility] VISIT-LEV 4 EST PT Modifier: Quantity: 1 Physician Procedures : CPT4 Code Description Modifier [id number] - WC PHYS LEVEL 4 - NEW PT ICD-10 Diagnosis Description L89.314 Pressure ulcer of right buttock, stage 4  G35 Multiple sclerosis M46.28 Osteomyelitis of vertebra, sacral and sacrococcygeal region Quantity: 1 Electronic Signature(s) Signed: 03/26/2021 12:14:34 PM By: Kalman Shan DO Signed: 03/28/2021 5:10:09 PM By: Levan Hurst RN, BSN Previous Signature: 03/26/2021 11:54:18 AM Version By: Kalman Shan DO Entered By: Levan Hurst on 03/26/2021 11:55:38

## 2021-03-28 NOTE — Progress Notes (Signed)
Niu, Sharma L. ([id number]) Visit Report for 03/26/2021 Abuse/Suicide Risk Screen Details Patient Name: Date of Service: [name], Kalei L. 03/26/2021 9:00 A M Medical Record Number: [id number] Patient Account Number: [id number] Date of Birth/Sex: Treating RN: [birth year] (51 y.o. Wynelle Link Primary Care Laretta Pyatt: Marletta Lor Other Clinician: Referring Aamna Mallozzi: Treating Gennett Garcia/Extender: Stasia Cavalier in Treatment: 0 Abuse/Suicide Risk Screen Items Answer ABUSE RISK SCREEN: Has anyone close to you tried to hurt or harm you recentlyo No Do you feel uncomfortable with anyone in your familyo No Has anyone forced you do things that you didnt want to doo No Electronic Signature(s) Signed: 03/28/2021 5:10:09 PM By: Zandra Abts RN, BSN Entered By: Zandra Abts on 03/26/2021 09:14:51 -------------------------------------------------------------------------------- Activities of Daily Living Details Patient Name: Date of Service: [name], Aspynn L. 03/26/2021 9:00 A M Medical Record Number: [id number] Patient Account Number: [id number] Date of Birth/Sex: Treating RN: April 09, 1970 (51 y.o. Wynelle Link Primary Care Jaquasia Doscher: Marletta Lor Other Clinician: Referring Donevin Sainsbury: Treating Lesslie Mossa/Extender: Stasia Cavalier in Treatment: 0 Activities of Daily Living Items Answer Activities of Daily Living (Please select one for each item) Drive Automobile Not Able T Medications ake Need Assistance Use T elephone Need Assistance Care for Appearance Need Assistance Use T oilet Not Able Bath / Shower Need Assistance Dress Self Need Assistance Feed Self Completely Able Walk Not Able Get In / Out Bed Need Assistance Housework Not Able Prepare Meals Not Able Handle Money Need Assistance Shop for Self Need Assistance Electronic Signature(s) Signed: 03/28/2021 5:10:09 PM By: Zandra Abts RN, BSN Entered By: Zandra Abts on 03/26/2021  09:15:39 -------------------------------------------------------------------------------- Education Screening Details Patient Name: Date of Service: [name], Dawn L. 03/26/2021 9:00 A M Medical Record Number: [id number] Patient Account Number: [id number] Date of Birth/Sex: Treating RN: Apr 12, 1970 (51 y.o. Wynelle Link Primary Care Isair Inabinet: Marletta Lor Other Clinician: Referring Gad Aymond: Treating Zorawar Strollo/Extender: Stasia Cavalier in Treatment: 0 Primary Learner Assessed: Patient Learning Preferences/Education Level/Primary Language Learning Preference: Explanation, Demonstration, Printed Material Highest Education Level: High School Preferred Language: English Cognitive Barrier Language Barrier: No Translator Needed: No Memory Deficit: No Emotional Barrier: No Cultural/Religious Beliefs Affecting Medical Care: No Physical Barrier Impaired Vision: No Impaired Hearing: No Decreased Hand dexterity: No Knowledge/Comprehension Knowledge Level: High Comprehension Level: High Ability to understand written instructions: High Ability to understand verbal instructions: High Motivation Anxiety Level: Calm Cooperation: Cooperative Education Importance: Acknowledges Need Interest in Health Problems: Asks Questions Perception: Coherent Willingness to Engage in Self-Management High Activities: Readiness to Engage in Self-Management High Activities: Electronic Signature(s) Signed: 03/28/2021 5:10:09 PM By: Zandra Abts RN, BSN Entered By: Zandra Abts on 03/26/2021 09:16:11 -------------------------------------------------------------------------------- Fall Risk Assessment Details Patient Name: Date of Service: [name], Asharia L. 03/26/2021 9:00 A M Medical Record Number: [id number] Patient Account Number: [id number] Date of Birth/Sex: Treating RN: 03-21-1970 (50 y.o. Wynelle Link Primary Care Rozell Theiler: Marletta Lor Other Clinician: Referring  Manvir Prabhu: Treating Earlean Fidalgo/Extender: Stasia Cavalier in Treatment: 0 Fall Risk Assessment Items Have you had 2 or more falls in the last 12 monthso 0 No Have you had any fall that resulted in injury in the last 12 monthso 0 No FALLS RISK SCREEN History of falling - immediate or within 3 months 0 No Secondary diagnosis (Do you have 2 or more medical diagnoseso) 15 Yes Ambulatory aid None/bed rest/wheelchair/nurse 0 Yes Crutches/cane/walker 0 No Furniture 0 No Intravenous therapy Access/Saline/Heparin Lock 0 No Gait/Transferring Normal/ bed  rest/ wheelchair 0 Yes Weak (short steps with or without shuffle, stooped but able to lift head while walking, may seek 0 No support from furniture) Impaired (short steps with shuffle, may have difficulty arising from chair, head down, impaired 0 No balance) Mental Status Oriented to own ability 0 Yes Electronic Signature(s) Signed: 03/28/2021 5:10:09 PM By: Zandra Abts RN, BSN Entered By: Zandra Abts on 03/26/2021 09:16:33 -------------------------------------------------------------------------------- Nutrition Risk Screening Details Patient Name: Date of Service: [name], Eliese L. 03/26/2021 9:00 A M Medical Record Number: [id number] Patient Account Number: [id number] Date of Birth/Sex: Treating RN: 1970-07-15 (51 y.o. Wynelle Link Primary Care Aodhan Scheidt: Marletta Lor Other Clinician: Referring Ayriana Wix: Treating Dyan Labarbera/Extender: Stasia Cavalier in Treatment: 0 Height (in): 62 Weight (lbs): 145 Body Mass Index (BMI): 26.5 Nutrition Risk Screening Items Score Screening NUTRITION RISK SCREEN: I have an illness or condition that made me change the kind and/or amount of food I eat 0 No I eat fewer than two meals per day 0 No I eat few fruits and vegetables, or milk products 0 No I have three or more drinks of beer, liquor or wine almost every day 0 No I have tooth or mouth problems that  make it hard for me to eat 0 No I don't always have enough money to buy the food I need 0 No I eat alone most of the time 0 No I take three or more different prescribed or over-the-counter drugs a day 1 Yes Without wanting to, I have lost or gained 10 pounds in the last six months 0 No I am not always physically able to shop, cook and/or feed myself 2 Yes Nutrition Protocols Good Risk Protocol Moderate Risk Protocol 0 Provide education on nutrition High Risk Proctocol Risk Level: Moderate Risk Score: 3 Electronic Signature(s) Signed: 03/28/2021 5:10:09 PM By: Zandra Abts RN, BSN Entered By: Zandra Abts on 03/26/2021 09:16:44

## 2021-03-28 NOTE — Progress Notes (Signed)
Lever, Lakima L. ([id number]) Visit Report for 03/26/2021 Allergy List Details Patient Name: Date of Service: [name], [name] 03/26/2021 9:00 A M Medical Record Number: [id number] Patient Account Number: [id number] Date of Birth/Sex: Treating RN: 1970-08-31 (51 y.o. [name] Primary Care Analiz Tvedt: Alvester Chou Other Clinician: Referring Owen Pratte: Treating Betsie Peckman/Extender: [name] in Treatment: 0 Allergies Active Allergies silver Reaction: bleeding tizanidine Reaction: diarrhea tomato Allergy Notes Electronic Signature(s) Signed: 03/28/2021 5:10:09 PM By: Levan Hurst RN, BSN Entered By: Levan Hurst on 03/26/2021 09:10:15 -------------------------------------------------------------------------------- Arrival Information Details Patient Name: Date of Service: [name], [name] 03/26/2021 9:00 A M Medical Record Number: [id number] Patient Account Number: [id number] Date of Birth/Sex: Treating RN: 18-Oct-1970 (51 y.o. [name] Primary Care Marcial Pless: Alvester Chou Other Clinician: Referring Ben Habermann: Treating Brandie Lopes/Extender: [name] in Treatment: 0 Visit Information Patient Arrived: [name] Arrival Time: 08:54 Accompanied By: mother Transfer Assistance: [name] Patient Identification Verified: Yes Secondary Verification Process Completed: Yes Patient Requires Transmission-Based Precautions: No Patient Has Alerts: No Electronic Signature(s) Signed: 03/28/2021 5:10:09 PM By: Levan Hurst RN, BSN Entered By: Levan Hurst on 03/26/2021 09:07:52 -------------------------------------------------------------------------------- Clinic Level of Care Assessment Details Patient Name: Date of Service: [name], [name] 03/26/2021 9:00 A M Medical Record Number: [id number] Patient Account Number: [id number] Date of Birth/Sex: Treating RN: 03-15-1970 (51 y.o. [name] Primary Care Dia Donate:  Alvester Chou Other Clinician: Referring Ebelin Dillehay: Treating Viet Kemmerer/Extender: [name] in Treatment: 0 Clinic Level of Care Assessment Items TOOL 2 Quantity Score X- 1 0 Use when only an EandM is performed on the INITIAL visit ASSESSMENTS - Nursing Assessment / Reassessment X- 1 20 General Physical Exam (combine w/ comprehensive assessment (listed just below) when performed on new pt. evals) X- 1 25 Comprehensive Assessment (HX, ROS, Risk Assessments, Wounds Hx, etc.) ASSESSMENTS - Wound and Skin A ssessment / Reassessment X - Simple Wound Assessment / Reassessment - one wound 1 5 []  - 0 Complex Wound Assessment / Reassessment - multiple wounds []  - 0 Dermatologic / Skin Assessment (not related to wound area) ASSESSMENTS - Ostomy and/or Continence Assessment and Care []  - 0 Incontinence Assessment and Management []  - 0 Ostomy Care Assessment and Management (repouching, etc.) PROCESS - Coordination of Care X - Simple Patient / Family Education for ongoing care 1 15 []  - 0 Complex (extensive) Patient / Family Education for ongoing care X- 1 10 Staff obtains Programmer, systems, Records, T Results / Process Orders est X- 1 10 Staff telephones HHA, Nursing Homes / Clarify orders / etc []  - 0 Routine Transfer to another Facility (non-emergent condition) []  - 0 Routine Hospital Admission (non-emergent condition) []  - 0 New Admissions / Biomedical engineer / Ordering NPWT Apligraf, etc. , []  - 0 Emergency Hospital Admission (emergent condition) X- 1 10 Simple Discharge Coordination []  - 0 Complex (extensive) Discharge Coordination PROCESS - Special Needs []  - 0 Pediatric / Minor Patient Management []  - 0 Isolation Patient Management []  - 0 Hearing / Language / Visual special needs []  - 0 Assessment of Community assistance (transportation, D/C planning, etc.) []  - 0 Additional assistance / Altered mentation []  - 0 Support Surface(s) Assessment  (bed, cushion, seat, etc.) INTERVENTIONS - Wound Cleansing / Measurement X- 1 5 Wound Imaging (photographs - any number of wounds) []  - 0 Wound Tracing (instead of photographs) X- 1 5 Simple Wound Measurement - one wound []  - 0 Complex Wound Measurement - multiple wounds X- 1  5 Simple Wound Cleansing - one wound []  - 0 Complex Wound Cleansing - multiple wounds INTERVENTIONS - Wound Dressings X - Small Wound Dressing one or multiple wounds 1 10 []  - 0 Medium Wound Dressing one or multiple wounds []  - 0 Large Wound Dressing one or multiple wounds []  - 0 Application of Medications - injection INTERVENTIONS - Miscellaneous []  - 0 External ear exam []  - 0 Specimen Collection (cultures, biopsies, blood, body fluids, etc.) []  - 0 Specimen(s) / Culture(s) sent or taken to Lab for analysis []  - 0 Patient Transfer (multiple staff / Harrel Lemon Lift / Similar devices) []  - 0 Simple Staple / Suture removal (25 or less) []  - 0 Complex Staple / Suture removal (26 or more) []  - 0 Hypo / Hyperglycemic Management (close monitor of Blood Glucose) []  - 0 Ankle / Brachial Index (ABI) - do not check if billed separately Has the patient been seen at the hospital within the last three years: Yes Total Score: 120 Level Of Care: New/Established - Level 4 Electronic Signature(s) Signed: 03/28/2021 5:10:09 PM By: Levan Hurst RN, BSN Entered By: Levan Hurst on 03/26/2021 11:55:29 -------------------------------------------------------------------------------- Encounter Discharge Information Details Patient Name: Date of Service: [name], [name] 03/26/2021 9:00 A M Medical Record Number: [id number] Patient Account Number: [id number] Date of Birth/Sex: Treating RN: 08-27-1970 (51 y.o. [name] Primary Care Lisa Blakeman: Alvester Chou Other Clinician: Referring Jerremy Maione: Treating Jawad Wiacek/Extender: [name] in Treatment: 0 Encounter Discharge Information  Items Discharge Condition: Stable Ambulatory Status: [name] Discharge Destination: Home Transportation: Private Auto Accompanied By: mother Schedule Follow-up Appointment: Yes Clinical Summary of Care: Patient Declined Electronic Signature(s) Signed: 03/28/2021 5:10:09 PM By: Levan Hurst RN, BSN Entered By: Levan Hurst on 03/26/2021 11:57:18 -------------------------------------------------------------------------------- Multi Wound Chart Details Patient Name: Date of Service: [name], [name] 03/26/2021 9:00 A M Medical Record Number: [id number] Patient Account Number: [id number] Date of Birth/Sex: Treating RN: 10-Apr-1970 (51 y.o. [name] Primary Care Onda Kattner: Alvester Chou Other Clinician: Referring Fredi Hurtado: Treating Lyric Rossano/Extender: [name] in Treatment: 0 Vital Signs Height(in): 60 Pulse(bpm): 20 Weight(lbs): 145 Blood Pressure(mmHg): 105/71 Body Mass Index(BMI): 27 Temperature(F): 97.9 Respiratory Rate(breaths/min): 18 Photos: [N/A:N/A] Right Gluteus N/A N/A Wound Location: Pressure Injury N/A N/A Wounding Event: Pressure Ulcer N/A N/A Primary Etiology: Hypertension, Osteomyelitis, N/A N/A Comorbid History: Paraplegia 12/09/2020 N/A N/A Date Acquired: 0 N/A N/A Weeks of Treatment: Open N/A N/A Wound Status: 5x4.5x4.8 N/A N/A Measurements L x W x D (cm) 17.671 N/A N/A A (cm) : rea 84.823 N/A N/A Volume (cm) : 0.00% N/A N/A % Reduction in A rea: 0.00% N/A N/A % Reduction in Volume: 1 Starting Position 1 (o'clock): 4 Ending Position 1 (o'clock): 5.2 Maximum Distance 1 (cm): Yes N/A N/A Undermining: Category/Stage IV N/A N/A Classification: Medium N/A N/A Exudate A mount: Serosanguineous N/A N/A Exudate Type: red, brown N/A N/A Exudate Color: Well defined, not attached N/A N/A Wound Margin: Large (67-100%) N/A N/A Granulation A mount: Red N/A N/A Granulation Quality: Small (1-33%) N/A  N/A Necrotic A mount: Fat Layer (Subcutaneous Tissue): Yes N/A N/A Exposed Structures: Fascia: No Tendon: No Muscle: No Joint: No Bone: No None N/A N/A Epithelialization: Treatment Notes Electronic Signature(s) Signed: 03/26/2021 11:54:18 AM By: Kalman Shan DO Signed: 03/27/2021 3:58:04 PM By: Lorrin Jackson Entered By: Kalman Shan on 03/26/2021 11:22:14 -------------------------------------------------------------------------------- Multi-Disciplinary Care Plan Details Patient Name: Date of Service: [name], [name] 03/26/2021 9:00 A M Medical Record Number: [id number]  Patient Account Number: [id number] Date of Birth/Sex: Treating RN: 1970-03-21 (51 y.o. [name] Primary Care Nahom Carfagno: Alvester Chou Other Clinician: Referring Cleopha Indelicato: Treating Kentavious Michele/Extender: [name] in Treatment: 0 Multidisciplinary Care Plan reviewed with physician Active Inactive Nutrition Nursing Diagnoses: Potential for alteratiion in Nutrition/Potential for imbalanced nutrition Goals: Patient/caregiver agrees to and verbalizes understanding of need to use nutritional supplements and/or vitamins as prescribed Date Initiated: 03/26/2021 Target Resolution Date: 04/27/2021 Goal Status: Active Interventions: Assess patient nutrition upon admission and as needed per policy Provide education on nutrition Notes: Pressure Nursing Diagnoses: Knowledge deficit related to causes and risk factors for pressure ulcer development Knowledge deficit related to management of pressures ulcers Potential for impaired tissue integrity related to pressure, friction, moisture, and shear Goals: Patient/caregiver will verbalize risk factors for pressure ulcer development Date Initiated: 03/26/2021 Target Resolution Date: 04/27/2021 Goal Status: Active Patient/caregiver will verbalize understanding of pressure ulcer management Date Initiated: 03/26/2021 Target Resolution  Date: 04/27/2021 Goal Status: Active Interventions: Assess: immobility, friction, shearing, incontinence upon admission and as needed Assess offloading mechanisms upon admission and as needed Assess potential for pressure ulcer upon admission and as needed Provide education on pressure ulcers Notes: Wound/Skin Impairment Nursing Diagnoses: Impaired tissue integrity Knowledge deficit related to ulceration/compromised skin integrity Goals: Patient/caregiver will verbalize understanding of skin care regimen Date Initiated: 03/26/2021 Target Resolution Date: 04/27/2021 Goal Status: Active Ulcer/skin breakdown will have a volume reduction of 30% by week 4 Date Initiated: 03/26/2021 Target Resolution Date: 04/27/2021 Goal Status: Active Interventions: Assess patient/caregiver ability to obtain necessary supplies Assess patient/caregiver ability to perform ulcer/skin care regimen upon admission and as needed Assess ulceration(s) every visit Provide education on ulcer and skin care Notes: Electronic Signature(s) Signed: 03/28/2021 5:10:09 PM By: Levan Hurst RN, BSN Entered By: Levan Hurst on 03/26/2021 10:18:46 -------------------------------------------------------------------------------- Pain Assessment Details Patient Name: Date of Service: [name], [name] 03/26/2021 9:00 A M Medical Record Number: [id number] Patient Account Number: [id number] Date of Birth/Sex: Treating RN: 1970-08-17 (51 y.o. [name] Primary Care Vincenta Steffey: Alvester Chou Other Clinician: Referring Janus Vlcek: Treating Damaree Sargent/Extender: [name] in Treatment: 0 Active Problems Location of Pain Severity and Description of Pain Patient Has Paino Yes Site Locations Pain Location: Pain in Ulcers With Dressing Change: Yes Duration of the Pain. Constant / Intermittento Intermittent Rate the pain. Current Pain Level: 4 Worst Pain Level: 8 Least Pain Level: 0 Character of  Pain Describe the Pain: Tender, Throbbing Pain Management and Medication Current Pain Management: Medication: Yes Cold Application: No Rest: No Massage: No Activity: No T.E.N.S.: No Heat Application: No Leg drop or elevation: No Is the Current Pain Management Adequate: Adequate How does your wound impact your activities of daily livingo Sleep: No Bathing: No Appetite: No Relationship With Others: No Bladder Continence: No Emotions: No Bowel Continence: No Work: No Toileting: No Drive: No Dressing: No Hobbies: No Engineer, maintenance) Signed: 03/28/2021 5:10:09 PM By: Levan Hurst RN, BSN Entered By: Levan Hurst on 03/26/2021 09:23:07 -------------------------------------------------------------------------------- Patient/Caregiver Education Details Patient Name: Date of Service: [name], [name] Jews 1/16/2023andnbsp9:00 A M Medical Record Number: [id number] Patient Account Number: [id number] Date of Birth/Gender: Treating RN: 1970-06-11 (51 y.o. [name] Primary Care Physician: Alvester Chou Other Clinician: Referring Physician: Treating Physician/Extender: [name] in Treatment: 0 Education Assessment Education Provided To: Patient Education Topics Provided Nutrition: Methods: Explain/Verbal Responses: State content correctly Pressure: Methods: Explain/Verbal Responses: State content correctly Wound/Skin Impairment: Methods: Explain/Verbal Responses: State content  correctly Electronic Signature(s) Signed: 03/28/2021 5:10:09 PM By: Levan Hurst RN, BSN Entered By: Levan Hurst on 03/26/2021 11:54:31 -------------------------------------------------------------------------------- Wound Assessment Details Patient Name: Date of Service: [name], [name] 03/26/2021 9:00 A M Medical Record Number: [id number] Patient Account Number: [id number] Date of Birth/Sex: Treating RN: 1970-07-28 (52 y.o. [name] Primary Care Wyn Nettle: Alvester Chou Other Clinician: Referring Mahmud Keithly: Treating Joshlynn Alfonzo/Extender: [name] in Treatment: 0 Wound Status Wound Number: 1 Primary Etiology: Pressure Ulcer Wound Location: Right Gluteus Wound Status: Open Wounding Event: Pressure Injury Comorbid History: Hypertension, Osteomyelitis, Paraplegia Date Acquired: 12/09/2020 Weeks Of Treatment: 0 Clustered Wound: No Photos Wound Measurements Length: (cm) 5 Width: (cm) 4.5 Depth: (cm) 4.8 Area: (cm) 17.671 Volume: (cm) 84.823 % Reduction in Area: 0% % Reduction in Volume: 0% Epithelialization: None Tunneling: No Undermining: Yes Starting Position (o'clock): 1 Ending Position (o'clock): 4 Maximum Distance: (cm) 5.2 Wound Description Classification: Category/Stage IV Wound Margin: Well defined, not attached Exudate Amount: Medium Exudate Type: Serosanguineous Exudate Color: red, brown Foul Odor After Cleansing: No Slough/Fibrino Yes Wound Bed Granulation Amount: Large (67-100%) Exposed Structure Granulation Quality: Red Fascia Exposed: No Necrotic Amount: Small (1-33%) Fat Layer (Subcutaneous Tissue) Exposed: Yes Necrotic Quality: Adherent Slough Tendon Exposed: No Muscle Exposed: No Joint Exposed: No Bone Exposed: No Treatment Notes Wound #1 (Gluteus) Wound Laterality: Right Cleanser Vashe, 8.5 (oz) Discharge Instruction: Clean wound with Vashe or Anasept Peri-Wound Care Skin Prep Discharge Instruction: Use skin prep as directed Topical Primary Dressing Maxorb Extra Calcium Alginate Dressing, 4x4 in Discharge Instruction: Apply calcium alginate to wound bed as instructed Secondary Dressing Zetuvit Plus Silicone Border Dressing 5x5 (in/in) Discharge Instruction: Apply silicone border over primary dressing as directed. Secured With Compression Wrap Compression Stockings Environmental education officer) Signed: 03/27/2021 3:58:04 PM By: Lorrin Jackson Signed: 03/28/2021 5:10:09 PM By: Levan Hurst RN, BSN Entered By: Levan Hurst on 03/26/2021 09:19:35 -------------------------------------------------------------------------------- [place] Details Patient Name: Date of Service: [name], Ameerah L. 03/26/2021 9:00 A M Medical Record Number: [id number] Patient Account Number: [id number] Date of Birth/Sex: Treating RN: [birth year] (51 y.o. [name] Primary Care Royalty Fakhouri: Alvester Chou Other Clinician: Referring Maleiya Pergola: Treating Zyere Jiminez/Extender: [name] in Treatment: 0 Vital Signs Time Taken: 08:54 Temperature (F): 97.9 Height (in): 62 Pulse (bpm): 67 Source: Stated Respiratory Rate (breaths/min): 18 Weight (lbs): 145 Blood Pressure (mmHg): 105/71 Source: Stated Reference Range: 80 - 120 mg / dl Body Mass Index (BMI): 26.5 Electronic Signature(s) Signed: 03/28/2021 5:10:09 PM By: Levan Hurst RN, BSN Entered By: Levan Hurst on 03/26/2021 09:08:51

## 2021-03-30 DIAGNOSIS — L89312 Pressure ulcer of right buttock, stage 2: Secondary | ICD-10-CM | POA: Diagnosis not present

## 2021-03-30 DIAGNOSIS — I10 Essential (primary) hypertension: Secondary | ICD-10-CM | POA: Diagnosis not present

## 2021-03-30 DIAGNOSIS — E440 Moderate protein-calorie malnutrition: Secondary | ICD-10-CM | POA: Diagnosis not present

## 2021-03-30 DIAGNOSIS — M4628 Osteomyelitis of vertebra, sacral and sacrococcygeal region: Secondary | ICD-10-CM | POA: Diagnosis not present

## 2021-03-30 DIAGNOSIS — G35 Multiple sclerosis: Secondary | ICD-10-CM | POA: Diagnosis not present

## 2021-03-30 DIAGNOSIS — G8220 Paraplegia, unspecified: Secondary | ICD-10-CM | POA: Diagnosis not present

## 2021-03-30 DIAGNOSIS — N179 Acute kidney failure, unspecified: Secondary | ICD-10-CM | POA: Diagnosis not present

## 2021-03-30 DIAGNOSIS — I700 Atherosclerosis of aorta: Secondary | ICD-10-CM | POA: Diagnosis not present

## 2021-03-30 DIAGNOSIS — L89314 Pressure ulcer of right buttock, stage 4: Secondary | ICD-10-CM | POA: Diagnosis not present

## 2021-04-03 ENCOUNTER — Encounter (HOSPITAL_BASED_OUTPATIENT_CLINIC_OR_DEPARTMENT_OTHER): Payer: Medicare PPO | Admitting: Internal Medicine

## 2021-04-03 DIAGNOSIS — N179 Acute kidney failure, unspecified: Secondary | ICD-10-CM | POA: Diagnosis not present

## 2021-04-03 DIAGNOSIS — N8184 Pelvic muscle wasting: Secondary | ICD-10-CM | POA: Diagnosis not present

## 2021-04-03 DIAGNOSIS — I10 Essential (primary) hypertension: Secondary | ICD-10-CM | POA: Diagnosis not present

## 2021-04-03 DIAGNOSIS — I700 Atherosclerosis of aorta: Secondary | ICD-10-CM | POA: Diagnosis not present

## 2021-04-03 DIAGNOSIS — E440 Moderate protein-calorie malnutrition: Secondary | ICD-10-CM | POA: Diagnosis not present

## 2021-04-03 DIAGNOSIS — L89314 Pressure ulcer of right buttock, stage 4: Secondary | ICD-10-CM | POA: Diagnosis not present

## 2021-04-03 DIAGNOSIS — G35 Multiple sclerosis: Secondary | ICD-10-CM | POA: Diagnosis not present

## 2021-04-03 DIAGNOSIS — M4628 Osteomyelitis of vertebra, sacral and sacrococcygeal region: Secondary | ICD-10-CM | POA: Diagnosis not present

## 2021-04-03 DIAGNOSIS — G8220 Paraplegia, unspecified: Secondary | ICD-10-CM | POA: Diagnosis not present

## 2021-04-06 DIAGNOSIS — N8184 Pelvic muscle wasting: Secondary | ICD-10-CM | POA: Diagnosis not present

## 2021-04-06 DIAGNOSIS — G35 Multiple sclerosis: Secondary | ICD-10-CM | POA: Diagnosis not present

## 2021-04-06 DIAGNOSIS — I10 Essential (primary) hypertension: Secondary | ICD-10-CM | POA: Diagnosis not present

## 2021-04-06 DIAGNOSIS — I700 Atherosclerosis of aorta: Secondary | ICD-10-CM | POA: Diagnosis not present

## 2021-04-06 DIAGNOSIS — E440 Moderate protein-calorie malnutrition: Secondary | ICD-10-CM | POA: Diagnosis not present

## 2021-04-06 DIAGNOSIS — L89314 Pressure ulcer of right buttock, stage 4: Secondary | ICD-10-CM | POA: Diagnosis not present

## 2021-04-06 DIAGNOSIS — G8220 Paraplegia, unspecified: Secondary | ICD-10-CM | POA: Diagnosis not present

## 2021-04-06 DIAGNOSIS — M4628 Osteomyelitis of vertebra, sacral and sacrococcygeal region: Secondary | ICD-10-CM | POA: Diagnosis not present

## 2021-04-06 DIAGNOSIS — N179 Acute kidney failure, unspecified: Secondary | ICD-10-CM | POA: Diagnosis not present

## 2021-04-10 DIAGNOSIS — L89314 Pressure ulcer of right buttock, stage 4: Secondary | ICD-10-CM | POA: Diagnosis not present

## 2021-04-10 DIAGNOSIS — I700 Atherosclerosis of aorta: Secondary | ICD-10-CM | POA: Diagnosis not present

## 2021-04-10 DIAGNOSIS — G8220 Paraplegia, unspecified: Secondary | ICD-10-CM | POA: Diagnosis not present

## 2021-04-10 DIAGNOSIS — G35 Multiple sclerosis: Secondary | ICD-10-CM | POA: Diagnosis not present

## 2021-04-10 DIAGNOSIS — E440 Moderate protein-calorie malnutrition: Secondary | ICD-10-CM | POA: Diagnosis not present

## 2021-04-10 DIAGNOSIS — N179 Acute kidney failure, unspecified: Secondary | ICD-10-CM | POA: Diagnosis not present

## 2021-04-10 DIAGNOSIS — M4628 Osteomyelitis of vertebra, sacral and sacrococcygeal region: Secondary | ICD-10-CM | POA: Diagnosis not present

## 2021-04-10 DIAGNOSIS — I10 Essential (primary) hypertension: Secondary | ICD-10-CM | POA: Diagnosis not present

## 2021-04-10 DIAGNOSIS — N8184 Pelvic muscle wasting: Secondary | ICD-10-CM | POA: Diagnosis not present

## 2021-04-13 DIAGNOSIS — N179 Acute kidney failure, unspecified: Secondary | ICD-10-CM | POA: Diagnosis not present

## 2021-04-13 DIAGNOSIS — G35 Multiple sclerosis: Secondary | ICD-10-CM | POA: Diagnosis not present

## 2021-04-13 DIAGNOSIS — I10 Essential (primary) hypertension: Secondary | ICD-10-CM | POA: Diagnosis not present

## 2021-04-13 DIAGNOSIS — G8220 Paraplegia, unspecified: Secondary | ICD-10-CM | POA: Diagnosis not present

## 2021-04-13 DIAGNOSIS — L89314 Pressure ulcer of right buttock, stage 4: Secondary | ICD-10-CM | POA: Diagnosis not present

## 2021-04-13 DIAGNOSIS — I700 Atherosclerosis of aorta: Secondary | ICD-10-CM | POA: Diagnosis not present

## 2021-04-13 DIAGNOSIS — E440 Moderate protein-calorie malnutrition: Secondary | ICD-10-CM | POA: Diagnosis not present

## 2021-04-13 DIAGNOSIS — N8184 Pelvic muscle wasting: Secondary | ICD-10-CM | POA: Diagnosis not present

## 2021-04-13 DIAGNOSIS — M4628 Osteomyelitis of vertebra, sacral and sacrococcygeal region: Secondary | ICD-10-CM | POA: Diagnosis not present

## 2021-04-17 DIAGNOSIS — N8184 Pelvic muscle wasting: Secondary | ICD-10-CM | POA: Diagnosis not present

## 2021-04-17 DIAGNOSIS — G35 Multiple sclerosis: Secondary | ICD-10-CM | POA: Diagnosis not present

## 2021-04-17 DIAGNOSIS — I10 Essential (primary) hypertension: Secondary | ICD-10-CM | POA: Diagnosis not present

## 2021-04-17 DIAGNOSIS — L89314 Pressure ulcer of right buttock, stage 4: Secondary | ICD-10-CM | POA: Diagnosis not present

## 2021-04-17 DIAGNOSIS — I700 Atherosclerosis of aorta: Secondary | ICD-10-CM | POA: Diagnosis not present

## 2021-04-17 DIAGNOSIS — G8220 Paraplegia, unspecified: Secondary | ICD-10-CM | POA: Diagnosis not present

## 2021-04-17 DIAGNOSIS — N179 Acute kidney failure, unspecified: Secondary | ICD-10-CM | POA: Diagnosis not present

## 2021-04-17 DIAGNOSIS — M4628 Osteomyelitis of vertebra, sacral and sacrococcygeal region: Secondary | ICD-10-CM | POA: Diagnosis not present

## 2021-04-17 DIAGNOSIS — E440 Moderate protein-calorie malnutrition: Secondary | ICD-10-CM | POA: Diagnosis not present

## 2021-04-20 DIAGNOSIS — G8220 Paraplegia, unspecified: Secondary | ICD-10-CM | POA: Diagnosis not present

## 2021-04-20 DIAGNOSIS — L89314 Pressure ulcer of right buttock, stage 4: Secondary | ICD-10-CM | POA: Diagnosis not present

## 2021-04-20 DIAGNOSIS — N8184 Pelvic muscle wasting: Secondary | ICD-10-CM | POA: Diagnosis not present

## 2021-04-20 DIAGNOSIS — N179 Acute kidney failure, unspecified: Secondary | ICD-10-CM | POA: Diagnosis not present

## 2021-04-20 DIAGNOSIS — M4628 Osteomyelitis of vertebra, sacral and sacrococcygeal region: Secondary | ICD-10-CM | POA: Diagnosis not present

## 2021-04-20 DIAGNOSIS — G35 Multiple sclerosis: Secondary | ICD-10-CM | POA: Diagnosis not present

## 2021-04-20 DIAGNOSIS — I10 Essential (primary) hypertension: Secondary | ICD-10-CM | POA: Diagnosis not present

## 2021-04-20 DIAGNOSIS — I700 Atherosclerosis of aorta: Secondary | ICD-10-CM | POA: Diagnosis not present

## 2021-04-20 DIAGNOSIS — E440 Moderate protein-calorie malnutrition: Secondary | ICD-10-CM | POA: Diagnosis not present

## 2021-04-23 DIAGNOSIS — G35 Multiple sclerosis: Secondary | ICD-10-CM | POA: Diagnosis not present

## 2021-04-23 DIAGNOSIS — N8184 Pelvic muscle wasting: Secondary | ICD-10-CM | POA: Diagnosis not present

## 2021-04-23 DIAGNOSIS — L89314 Pressure ulcer of right buttock, stage 4: Secondary | ICD-10-CM | POA: Diagnosis not present

## 2021-04-23 DIAGNOSIS — I10 Essential (primary) hypertension: Secondary | ICD-10-CM | POA: Diagnosis not present

## 2021-04-23 DIAGNOSIS — E440 Moderate protein-calorie malnutrition: Secondary | ICD-10-CM | POA: Diagnosis not present

## 2021-04-23 DIAGNOSIS — N179 Acute kidney failure, unspecified: Secondary | ICD-10-CM | POA: Diagnosis not present

## 2021-04-23 DIAGNOSIS — M4628 Osteomyelitis of vertebra, sacral and sacrococcygeal region: Secondary | ICD-10-CM | POA: Diagnosis not present

## 2021-04-23 DIAGNOSIS — I700 Atherosclerosis of aorta: Secondary | ICD-10-CM | POA: Diagnosis not present

## 2021-04-23 DIAGNOSIS — G8220 Paraplegia, unspecified: Secondary | ICD-10-CM | POA: Diagnosis not present

## 2021-04-27 DIAGNOSIS — M4628 Osteomyelitis of vertebra, sacral and sacrococcygeal region: Secondary | ICD-10-CM | POA: Diagnosis not present

## 2021-04-27 DIAGNOSIS — E440 Moderate protein-calorie malnutrition: Secondary | ICD-10-CM | POA: Diagnosis not present

## 2021-04-27 DIAGNOSIS — G35 Multiple sclerosis: Secondary | ICD-10-CM | POA: Diagnosis not present

## 2021-04-27 DIAGNOSIS — I700 Atherosclerosis of aorta: Secondary | ICD-10-CM | POA: Diagnosis not present

## 2021-04-27 DIAGNOSIS — N179 Acute kidney failure, unspecified: Secondary | ICD-10-CM | POA: Diagnosis not present

## 2021-04-27 DIAGNOSIS — L89314 Pressure ulcer of right buttock, stage 4: Secondary | ICD-10-CM | POA: Diagnosis not present

## 2021-04-27 DIAGNOSIS — I10 Essential (primary) hypertension: Secondary | ICD-10-CM | POA: Diagnosis not present

## 2021-04-27 DIAGNOSIS — G8220 Paraplegia, unspecified: Secondary | ICD-10-CM | POA: Diagnosis not present

## 2021-04-27 DIAGNOSIS — N8184 Pelvic muscle wasting: Secondary | ICD-10-CM | POA: Diagnosis not present

## 2021-04-29 ENCOUNTER — Encounter (HOSPITAL_BASED_OUTPATIENT_CLINIC_OR_DEPARTMENT_OTHER): Payer: Medicare PPO | Admitting: Internal Medicine

## 2021-04-29 ENCOUNTER — Encounter (HOSPITAL_BASED_OUTPATIENT_CLINIC_OR_DEPARTMENT_OTHER): Payer: Medicare PPO | Attending: Internal Medicine | Admitting: Internal Medicine

## 2021-05-01 DIAGNOSIS — L89314 Pressure ulcer of right buttock, stage 4: Secondary | ICD-10-CM | POA: Diagnosis not present

## 2021-05-01 DIAGNOSIS — M4628 Osteomyelitis of vertebra, sacral and sacrococcygeal region: Secondary | ICD-10-CM | POA: Diagnosis not present

## 2021-05-01 DIAGNOSIS — G35 Multiple sclerosis: Secondary | ICD-10-CM | POA: Diagnosis not present

## 2021-05-01 DIAGNOSIS — I10 Essential (primary) hypertension: Secondary | ICD-10-CM | POA: Diagnosis not present

## 2021-05-01 DIAGNOSIS — I700 Atherosclerosis of aorta: Secondary | ICD-10-CM | POA: Diagnosis not present

## 2021-05-01 DIAGNOSIS — N179 Acute kidney failure, unspecified: Secondary | ICD-10-CM | POA: Diagnosis not present

## 2021-05-01 DIAGNOSIS — E440 Moderate protein-calorie malnutrition: Secondary | ICD-10-CM | POA: Diagnosis not present

## 2021-05-01 DIAGNOSIS — N8184 Pelvic muscle wasting: Secondary | ICD-10-CM | POA: Diagnosis not present

## 2021-05-01 DIAGNOSIS — G8220 Paraplegia, unspecified: Secondary | ICD-10-CM | POA: Diagnosis not present

## 2021-05-04 DIAGNOSIS — I700 Atherosclerosis of aorta: Secondary | ICD-10-CM | POA: Diagnosis not present

## 2021-05-04 DIAGNOSIS — M4628 Osteomyelitis of vertebra, sacral and sacrococcygeal region: Secondary | ICD-10-CM | POA: Diagnosis not present

## 2021-05-04 DIAGNOSIS — L89314 Pressure ulcer of right buttock, stage 4: Secondary | ICD-10-CM | POA: Diagnosis not present

## 2021-05-04 DIAGNOSIS — E440 Moderate protein-calorie malnutrition: Secondary | ICD-10-CM | POA: Diagnosis not present

## 2021-05-04 DIAGNOSIS — G8220 Paraplegia, unspecified: Secondary | ICD-10-CM | POA: Diagnosis not present

## 2021-05-04 DIAGNOSIS — G35 Multiple sclerosis: Secondary | ICD-10-CM | POA: Diagnosis not present

## 2021-05-04 DIAGNOSIS — N179 Acute kidney failure, unspecified: Secondary | ICD-10-CM | POA: Diagnosis not present

## 2021-05-04 DIAGNOSIS — I10 Essential (primary) hypertension: Secondary | ICD-10-CM | POA: Diagnosis not present

## 2021-05-04 DIAGNOSIS — N8184 Pelvic muscle wasting: Secondary | ICD-10-CM | POA: Diagnosis not present

## 2021-05-07 ENCOUNTER — Encounter (HOSPITAL_BASED_OUTPATIENT_CLINIC_OR_DEPARTMENT_OTHER): Payer: Medicare PPO | Admitting: Internal Medicine

## 2021-05-08 DIAGNOSIS — M4628 Osteomyelitis of vertebra, sacral and sacrococcygeal region: Secondary | ICD-10-CM | POA: Diagnosis not present

## 2021-05-08 DIAGNOSIS — G8220 Paraplegia, unspecified: Secondary | ICD-10-CM | POA: Diagnosis not present

## 2021-05-08 DIAGNOSIS — L89314 Pressure ulcer of right buttock, stage 4: Secondary | ICD-10-CM | POA: Diagnosis not present

## 2021-05-08 DIAGNOSIS — N8184 Pelvic muscle wasting: Secondary | ICD-10-CM | POA: Diagnosis not present

## 2021-05-08 DIAGNOSIS — I700 Atherosclerosis of aorta: Secondary | ICD-10-CM | POA: Diagnosis not present

## 2021-05-08 DIAGNOSIS — N179 Acute kidney failure, unspecified: Secondary | ICD-10-CM | POA: Diagnosis not present

## 2021-05-08 DIAGNOSIS — I10 Essential (primary) hypertension: Secondary | ICD-10-CM | POA: Diagnosis not present

## 2021-05-08 DIAGNOSIS — E440 Moderate protein-calorie malnutrition: Secondary | ICD-10-CM | POA: Diagnosis not present

## 2021-05-08 DIAGNOSIS — G35 Multiple sclerosis: Secondary | ICD-10-CM | POA: Diagnosis not present

## 2021-05-11 ENCOUNTER — Encounter (HOSPITAL_BASED_OUTPATIENT_CLINIC_OR_DEPARTMENT_OTHER): Payer: Medicare PPO | Admitting: Internal Medicine

## 2021-05-11 DIAGNOSIS — E440 Moderate protein-calorie malnutrition: Secondary | ICD-10-CM | POA: Diagnosis not present

## 2021-05-11 DIAGNOSIS — N179 Acute kidney failure, unspecified: Secondary | ICD-10-CM | POA: Diagnosis not present

## 2021-05-11 DIAGNOSIS — I10 Essential (primary) hypertension: Secondary | ICD-10-CM | POA: Diagnosis not present

## 2021-05-11 DIAGNOSIS — L89314 Pressure ulcer of right buttock, stage 4: Secondary | ICD-10-CM | POA: Diagnosis not present

## 2021-05-11 DIAGNOSIS — M4628 Osteomyelitis of vertebra, sacral and sacrococcygeal region: Secondary | ICD-10-CM | POA: Diagnosis not present

## 2021-05-11 DIAGNOSIS — I700 Atherosclerosis of aorta: Secondary | ICD-10-CM | POA: Diagnosis not present

## 2021-05-11 DIAGNOSIS — N8184 Pelvic muscle wasting: Secondary | ICD-10-CM | POA: Diagnosis not present

## 2021-05-11 DIAGNOSIS — G35 Multiple sclerosis: Secondary | ICD-10-CM | POA: Diagnosis not present

## 2021-05-11 DIAGNOSIS — G8220 Paraplegia, unspecified: Secondary | ICD-10-CM | POA: Diagnosis not present

## 2021-05-15 DIAGNOSIS — M4628 Osteomyelitis of vertebra, sacral and sacrococcygeal region: Secondary | ICD-10-CM | POA: Diagnosis not present

## 2021-05-15 DIAGNOSIS — G35 Multiple sclerosis: Secondary | ICD-10-CM | POA: Diagnosis not present

## 2021-05-15 DIAGNOSIS — L89314 Pressure ulcer of right buttock, stage 4: Secondary | ICD-10-CM | POA: Diagnosis not present

## 2021-05-15 DIAGNOSIS — E440 Moderate protein-calorie malnutrition: Secondary | ICD-10-CM | POA: Diagnosis not present

## 2021-05-15 DIAGNOSIS — N8184 Pelvic muscle wasting: Secondary | ICD-10-CM | POA: Diagnosis not present

## 2021-05-15 DIAGNOSIS — I700 Atherosclerosis of aorta: Secondary | ICD-10-CM | POA: Diagnosis not present

## 2021-05-15 DIAGNOSIS — N179 Acute kidney failure, unspecified: Secondary | ICD-10-CM | POA: Diagnosis not present

## 2021-05-15 DIAGNOSIS — I10 Essential (primary) hypertension: Secondary | ICD-10-CM | POA: Diagnosis not present

## 2021-05-15 DIAGNOSIS — G8220 Paraplegia, unspecified: Secondary | ICD-10-CM | POA: Diagnosis not present

## 2021-05-18 DIAGNOSIS — M4628 Osteomyelitis of vertebra, sacral and sacrococcygeal region: Secondary | ICD-10-CM | POA: Diagnosis not present

## 2021-05-18 DIAGNOSIS — N8184 Pelvic muscle wasting: Secondary | ICD-10-CM | POA: Diagnosis not present

## 2021-05-18 DIAGNOSIS — E440 Moderate protein-calorie malnutrition: Secondary | ICD-10-CM | POA: Diagnosis not present

## 2021-05-18 DIAGNOSIS — I700 Atherosclerosis of aorta: Secondary | ICD-10-CM | POA: Diagnosis not present

## 2021-05-18 DIAGNOSIS — G35 Multiple sclerosis: Secondary | ICD-10-CM | POA: Diagnosis not present

## 2021-05-18 DIAGNOSIS — L89314 Pressure ulcer of right buttock, stage 4: Secondary | ICD-10-CM | POA: Diagnosis not present

## 2021-05-18 DIAGNOSIS — I10 Essential (primary) hypertension: Secondary | ICD-10-CM | POA: Diagnosis not present

## 2021-05-18 DIAGNOSIS — G8220 Paraplegia, unspecified: Secondary | ICD-10-CM | POA: Diagnosis not present

## 2021-05-18 DIAGNOSIS — N179 Acute kidney failure, unspecified: Secondary | ICD-10-CM | POA: Diagnosis not present

## 2021-05-21 DIAGNOSIS — I700 Atherosclerosis of aorta: Secondary | ICD-10-CM | POA: Diagnosis not present

## 2021-05-21 DIAGNOSIS — E440 Moderate protein-calorie malnutrition: Secondary | ICD-10-CM | POA: Diagnosis not present

## 2021-05-21 DIAGNOSIS — M4628 Osteomyelitis of vertebra, sacral and sacrococcygeal region: Secondary | ICD-10-CM | POA: Diagnosis not present

## 2021-05-21 DIAGNOSIS — N8184 Pelvic muscle wasting: Secondary | ICD-10-CM | POA: Diagnosis not present

## 2021-05-21 DIAGNOSIS — G8220 Paraplegia, unspecified: Secondary | ICD-10-CM | POA: Diagnosis not present

## 2021-05-21 DIAGNOSIS — G35 Multiple sclerosis: Secondary | ICD-10-CM | POA: Diagnosis not present

## 2021-05-21 DIAGNOSIS — L89314 Pressure ulcer of right buttock, stage 4: Secondary | ICD-10-CM | POA: Diagnosis not present

## 2021-05-21 DIAGNOSIS — I10 Essential (primary) hypertension: Secondary | ICD-10-CM | POA: Diagnosis not present

## 2021-05-21 DIAGNOSIS — N179 Acute kidney failure, unspecified: Secondary | ICD-10-CM | POA: Diagnosis not present

## 2021-05-25 DIAGNOSIS — I700 Atherosclerosis of aorta: Secondary | ICD-10-CM | POA: Diagnosis not present

## 2021-05-25 DIAGNOSIS — L89314 Pressure ulcer of right buttock, stage 4: Secondary | ICD-10-CM | POA: Diagnosis not present

## 2021-05-25 DIAGNOSIS — N179 Acute kidney failure, unspecified: Secondary | ICD-10-CM | POA: Diagnosis not present

## 2021-05-25 DIAGNOSIS — E440 Moderate protein-calorie malnutrition: Secondary | ICD-10-CM | POA: Diagnosis not present

## 2021-05-25 DIAGNOSIS — I10 Essential (primary) hypertension: Secondary | ICD-10-CM | POA: Diagnosis not present

## 2021-05-25 DIAGNOSIS — N8184 Pelvic muscle wasting: Secondary | ICD-10-CM | POA: Diagnosis not present

## 2021-05-25 DIAGNOSIS — M4628 Osteomyelitis of vertebra, sacral and sacrococcygeal region: Secondary | ICD-10-CM | POA: Diagnosis not present

## 2021-05-25 DIAGNOSIS — G35 Multiple sclerosis: Secondary | ICD-10-CM | POA: Diagnosis not present

## 2021-05-25 DIAGNOSIS — G8220 Paraplegia, unspecified: Secondary | ICD-10-CM | POA: Diagnosis not present

## 2021-05-28 DIAGNOSIS — G35 Multiple sclerosis: Secondary | ICD-10-CM | POA: Diagnosis not present

## 2021-05-28 DIAGNOSIS — E440 Moderate protein-calorie malnutrition: Secondary | ICD-10-CM | POA: Diagnosis not present

## 2021-05-28 DIAGNOSIS — L89314 Pressure ulcer of right buttock, stage 4: Secondary | ICD-10-CM | POA: Diagnosis not present

## 2021-05-28 DIAGNOSIS — G8220 Paraplegia, unspecified: Secondary | ICD-10-CM | POA: Diagnosis not present

## 2021-05-28 DIAGNOSIS — N8184 Pelvic muscle wasting: Secondary | ICD-10-CM | POA: Diagnosis not present

## 2021-05-28 DIAGNOSIS — M4628 Osteomyelitis of vertebra, sacral and sacrococcygeal region: Secondary | ICD-10-CM | POA: Diagnosis not present

## 2021-05-28 DIAGNOSIS — I700 Atherosclerosis of aorta: Secondary | ICD-10-CM | POA: Diagnosis not present

## 2021-05-28 DIAGNOSIS — N179 Acute kidney failure, unspecified: Secondary | ICD-10-CM | POA: Diagnosis not present

## 2021-05-28 DIAGNOSIS — I10 Essential (primary) hypertension: Secondary | ICD-10-CM | POA: Diagnosis not present

## 2021-05-29 ENCOUNTER — Other Ambulatory Visit: Payer: Self-pay

## 2021-05-29 ENCOUNTER — Encounter (HOSPITAL_BASED_OUTPATIENT_CLINIC_OR_DEPARTMENT_OTHER): Payer: Medicare PPO | Attending: Internal Medicine | Admitting: Internal Medicine

## 2021-05-29 DIAGNOSIS — M4628 Osteomyelitis of vertebra, sacral and sacrococcygeal region: Secondary | ICD-10-CM | POA: Diagnosis not present

## 2021-05-29 DIAGNOSIS — L89314 Pressure ulcer of right buttock, stage 4: Secondary | ICD-10-CM | POA: Diagnosis present

## 2021-05-29 DIAGNOSIS — I10 Essential (primary) hypertension: Secondary | ICD-10-CM | POA: Insufficient documentation

## 2021-05-29 DIAGNOSIS — Z87891 Personal history of nicotine dependence: Secondary | ICD-10-CM | POA: Insufficient documentation

## 2021-05-29 DIAGNOSIS — G35 Multiple sclerosis: Secondary | ICD-10-CM | POA: Diagnosis not present

## 2021-05-29 DIAGNOSIS — G8220 Paraplegia, unspecified: Secondary | ICD-10-CM | POA: Diagnosis not present

## 2021-05-29 NOTE — Progress Notes (Signed)
Weitman, Tashena L. ([id number]) ?Visit Report for 05/29/2021 ?Chief Complaint Document Details ?Patient Name: Date of Service: ?[name], [name] 05/29/2021 9:30 A M ?Medical Record Number: [id number] ?Patient Account Number: [id number] ?Date of Birth/Sex: Treating RN: ?[birth year] (51 y.o. F) ?Primary Care Provider: Alvester Chou Other Clinician: ?Referring Provider: ?Treating Provider/Extender: Kalman Shan ?Alvester Chou ?Weeks in Treatment: 9 ?Information Obtained from: Patient ?Chief Complaint ?Right gluteus wound ?Electronic Signature(s) ?Signed: 05/29/2021 11:46:16 AM By: Kalman Shan DO ?Entered By: Kalman Shan on 05/29/2021 11:20:43 ?-------------------------------------------------------------------------------- ?HPI Details ?Patient Name: Date of Service: ?[name], [name] 05/29/2021 9:30 A M ?Medical Record Number: [id number] ?Patient Account Number: [id number] ?Date of Birth/Sex: Treating RN: ?August 21, 1970 (51 y.o. F) ?Primary Care Provider: Alvester Chou Other Clinician: ?Referring Provider: ?Treating Provider/Extender: Kalman Shan ?Alvester Chou ?Weeks in Treatment: 9 ?History of Present Illness ?HPI Description: Admission 03/26/2021 ?Ms. Shanike Cobleigh is a 51 year old female with a past medical history of multiple sclerosis for the past 16 years, complicated by severe debility and bedbound ?since 2015 that presents to the clinic for a 10-month history of ulcer to the right buttocks. She has been admitted to the hospital several times for this issue. ?She was last hospitalized on 02/18/2021 for septic shock due to the infected ulcer. She was evaluated by infectious disease at that time. She completed a 2- ?week course of antibiotics. There is no plan for follow-up. She is currently using calcium alginate dressings. She has home health that comes out twice weekly. ?She currently denies signs of infection. She has an air mattress for offloading. Of note She has a history of osteomyelitis to the sacrum and  treated in 2020 ?with IV antibiotics for 8 weeks. ?3/21; patient presents for follow-up. She has been using calcium alginate to the wound bed and using Vashe to help clean prior to dressing changes. She has no ?issues or complaints today. ?Electronic Signature(s) ?Signed: 05/29/2021 11:46:16 AM By: Kalman Shan DO ?Entered By: Kalman Shan on 05/29/2021 11:21:34 ?-------------------------------------------------------------------------------- ?Physical Exam Details ?Patient Name: Date of Service: ?[name], [name] 05/29/2021 9:30 A M ?Medical Record Number: [id number] ?Patient Account Number: [id number] ?Date of Birth/Sex: Treating RN: ?07-07-1970 (50 y.o. F) ?Primary Care Provider: Alvester Chou Other Clinician: ?Referring Provider: ?Treating Provider/Extender: Kalman Shan ?Alvester Chou ?Weeks in Treatment: 9 ?Constitutional ?respirations regular, non-labored and within target range for patient.Marland Kitchen ?Psychiatric ?pleasant and cooperative. ?Notes ?Right buttocks: Open wound with granulation tissue and undermining. No surrounding signs of infection. ?Electronic Signature(s) ?Signed: 05/29/2021 11:46:16 AM By: Kalman Shan DO ?Entered By: Kalman Shan on 05/29/2021 11:22:03 ?-------------------------------------------------------------------------------- ?Physician Orders Details ?Patient Name: Date of Service: ?[name], [name] 05/29/2021 9:30 A M ?Medical Record Number: [id number] ?Patient Account Number: [id number] ?Date of Birth/Sex: Treating RN: ?[birth year] (51 y.o. [name] ?Primary Care Provider: Alvester Chou Other Clinician: ?Referring Provider: ?Treating Provider/Extender: Kalman Shan ?Alvester Chou ?Weeks in Treatment: 9 ?Verbal / Phone Orders: No ?Diagnosis Coding ?ICD-10 Coding ?Code Description ?L89.314 Pressure ulcer of right buttock, stage 4 ?G35 Multiple sclerosis ?M46.28 Osteomyelitis of vertebra, sacral and sacrococcygeal region ?Follow-up Appointments ?ppointment in: - 2 months  with Dr. Heber [place] ****STRETCHER**** ?Return A ?Bathing/ Shower/ Hygiene ?May shower with protection but do not get wound dressing(s) wet. ?Off-Loading ?Low air-loss mattress (Group 2) ?Turn and reposition every 2 hours ?Home Health ?No change in wound care orders this week; continue Home Health for wound care. May utilize formulary equivalent dressing for wound ?treatment orders unless otherwise specified. ?Other Home Health Orders/Instructions: -  [facility] ?Wound Treatment ?Wound #1 - Gluteus Wound Laterality: Right ?Cleanser: Vashe, 8.5 (oz) (Home Health) Every Other Day/30 Days ?Discharge Instructions: Clean wound with Vashe or Anasept ?Peri-Wound Care: Skin Prep [facility]) Every Other Day/30 Days ?Discharge Instructions: Use skin prep as directed ?Prim Dressing: Maxorb Extra Calcium Alginate Dressing, 4x4 in [facility]) Every Other Day/30 Days ?ary ?Discharge Instructions: Apply calcium alginate to wound bed as instructed ?Secondary Dressing: Zetuvit Plus Silicone Border Dressing 5x5 (in/in) (Home Health) Every Other Day/30 Days ?Discharge Instructions: Apply silicone border over primary dressing as directed. ?Electronic Signature(s) ?Signed: 05/29/2021 11:46:16 AM By: Kalman Shan DO ?Entered By: Kalman Shan on 05/29/2021 11:22:28 ?-------------------------------------------------------------------------------- ?Problem List Details ?Patient Name: ?Date of Service: ?[name], [name] 05/29/2021 9:30 A M ?Medical Record Number: [id number] ?Patient Account Number: [id number] ?Date of Birth/Sex: ?Treating RN: ?[birth year] (51 y.o. F) Deaton, Bobbi ?Primary Care Provider: Alvester Chou ?Other Clinician: ?Referring Provider: ?Treating Provider/Extender: Kalman Shan ?Alvester Chou ?Weeks in Treatment: 9 ?Active Problems ?ICD-10 ?Encounter ?Code Description Active Date MDM ?Diagnosis ?L89.314 Pressure ulcer of right buttock, stage 4 03/26/2021 No Yes ?G35 Multiple sclerosis 03/26/2021 No Yes ?M46.28  Osteomyelitis of vertebra, sacral and sacrococcygeal region 03/26/2021 No Yes ?Inactive Problems ?Resolved Problems ?Electronic Signature(s) ?Signed: 05/29/2021 11:46:16 AM By: Kalman Shan DO ?Entered By: Kalman Shan on 05/29/2021 11:20:11 ?-------------------------------------------------------------------------------- ?Progress Note Details ?Patient Name: ?Date of Service: ?[name], Lynora L. 05/29/2021 9:30 A M ?Medical Record Number: [id number] ?Patient Account Number: [id number] ?Date of Birth/Sex: ?Treating RN: ?1970/03/21 (51 y.o. F) ?Primary Care Provider: Alvester Chou ?Other Clinician: ?Referring Provider: ?Treating Provider/Extender: Kalman Shan ?Alvester Chou ?Weeks in Treatment: 9 ?Subjective ?Chief Complaint ?Information obtained from Patient ?Right gluteus wound ?History of Present Illness (HPI) ?Admission 03/26/2021 ?Ms. Kristyna Abdella is a 51 year old female with a past medical history of multiple sclerosis for the past 16 years, complicated by severe debility and bedbound ?since 2015 that presents to the clinic for a 15-month history of ulcer to the right buttocks. She has been admitted to the hospital several times for this issue. ?She was last hospitalized on 02/18/2021 for septic shock due to the infected ulcer. She was evaluated by infectious disease at that time. She completed a 2- ?week course of antibiotics. There is no plan for follow-up. She is currently using calcium alginate dressings. She has home health that comes out twice weekly. ?She currently denies signs of infection. She has an air mattress for offloading. Of note She has a history of osteomyelitis to the sacrum and treated in 2020 ?with IV antibiotics for 8 weeks. ?3/21; patient presents for follow-up. She has been using calcium alginate to the wound bed and using Vashe to help clean prior to dressing changes. She has no ?issues or complaints today. ?Patient History ?Information obtained from Patient. ?Family History ?Unknown  History. ?Social History ?Former smoker, Marital Status - Single, Alcohol Use - Never, Drug Use - No History, Caffeine Use - Rarely. ?Medical History ?Cardiovascular ?Patient has history of Hypertension ?Denies hi

## 2021-05-29 NOTE — Progress Notes (Signed)
[name], [name] ([id number]) ?Visit Report for 05/29/2021 ?Arrival Information Details ?Patient Name: Date of Service: ?[name], [name] 05/29/2021 9:30 A M ?Medical Record Number: [id number] ?Patient Account Number: [id number] ?Date of Birth/Sex: Treating RN: ?08-Jan-1971 (51 y.o. [name] ?Primary Care Quenesha Douglass: Alvester Chou Other Clinician: ?Referring Remmy Riffe: ?Treating Tavares Levinson/Extender: Kalman Shan ?Alvester Chou ?Weeks in Treatment: 9 ?Visit Information History Since Last Visit ?Added or deleted any medications: No ?Patient Arrived: Stretcher ?Any new allergies or adverse reactions: No ?Arrival Time: 10:15 ?Had a fall or experienced change in No ?Accompanied By: mom ?activities of daily living that may affect ?Transfer Assistance: Stretcher ?risk of falls: ?Patient Identification Verified: Yes ?Signs or symptoms of abuse/neglect since last visito No ?Secondary Verification Process Completed: Yes ?Hospitalized since last visit: No ?Patient Requires Transmission-Based Precautions: No ?Implantable device outside of the clinic excluding No ?Patient Has Alerts: No ?cellular tissue based products placed in the center ?since last visit: ?Has Dressing in Place as Prescribed: Yes ?Pain Present Now: No ?Electronic Signature(s) ?Signed: 05/29/2021 4:35:21 PM By: Lorrin Jackson ?Entered By: Lorrin Jackson on 05/29/2021 10:22:29 ?-------------------------------------------------------------------------------- ?Clinic Level of Care Assessment Details ?Patient Name: Date of Service: ?[name], [name] 05/29/2021 9:30 A M ?Medical Record Number: [id number] ?Patient Account Number: [id number] ?Date of Birth/Sex: Treating RN: ?1970-11-30 (51 y.o. [name] ?Primary Care Laurent Cargile: Alvester Chou Other Clinician: ?Referring Mads Borgmeyer: ?Treating Cardelia Sassano/Extender: Kalman Shan ?Alvester Chou ?Weeks in Treatment: 9 ?Clinic Level of Care Assessment Items ?TOOL 4 Quantity Score ?X- 1 0 ?Use when only an EandM is performed  on FOLLOW-UP visit ?ASSESSMENTS - Nursing Assessment / Reassessment ?X- 1 10 ?Reassessment of Co-morbidities (includes updates in patient status) ?X- 1 5 ?Reassessment of Adherence to Treatment Plan ?ASSESSMENTS - Wound and Skin A ssessment / Reassessment ?X - Simple Wound Assessment / Reassessment - one wound 1 5 ?[]  - 0 ?Complex Wound Assessment / Reassessment - multiple wounds ?[]  - 0 ?Dermatologic / Skin Assessment (not related to wound area) ?ASSESSMENTS - Focused Assessment ?[]  - 0 ?Circumferential Edema Measurements - multi extremities ?[]  - 0 ?Nutritional Assessment / Counseling / Intervention ?[]  - 0 ?Lower Extremity Assessment (monofilament, tuning fork, pulses) ?[]  - 0 ?Peripheral Arterial Disease Assessment (using hand held doppler) ?ASSESSMENTS - Ostomy and/or Continence Assessment and Care ?[]  - 0 ?Incontinence Assessment and Management ?[]  - 0 ?Ostomy Care Assessment and Management (repouching, etc.) ?PROCESS - Coordination of Care ?[]  - 0 ?Simple Patient / Family Education for ongoing care ?X- 1 20 ?Complex (extensive) Patient / Family Education for ongoing care ?X- 1 10 ?Staff obtains Consents, Records, T Results / Process Orders ?est ?X- 1 10 ?Staff telephones HHA, Nursing Homes / Clarify orders / etc ?[]  - 0 ?Routine Transfer to another Facility (non-emergent condition) ?[]  - 0 ?Routine Hospital Admission (non-emergent condition) ?[]  - 0 ?New Admissions / Biomedical engineer / Ordering NPWT Apligraf, etc. ?, ?[]  - 0 ?Emergency Hospital Admission (emergent condition) ?[]  - 0 ?Simple Discharge Coordination ?[]  - 0 ?Complex (extensive) Discharge Coordination ?PROCESS - Special Needs ?[]  - 0 ?Pediatric / Minor Patient Management ?[]  - 0 ?Isolation Patient Management ?[]  - 0 ?Hearing / Language / Visual special needs ?[]  - 0 ?Assessment of Community assistance (transportation, D/C planning, etc.) ?[]  - 0 ?Additional assistance / Altered mentation ?[]  - 0 ?Support Surface(s) Assessment (bed,  cushion, seat, etc.) ?INTERVENTIONS - Wound Cleansing / Measurement ?X - Simple Wound Cleansing - one wound 1 5 ?[]  - 0 ?Complex Wound Cleansing - multiple  wounds ?X- 1 5 ?Wound Imaging (photographs - any number of wounds) ?[]  - 0 ?Wound Tracing (instead of photographs) ?X- 1 5 ?Simple Wound Measurement - one wound ?[]  - 0 ?Complex Wound Measurement - multiple wounds ?INTERVENTIONS - Wound Dressings ?[]  - 0 ?Small Wound Dressing one or multiple wounds ?X- 1 15 ?Medium Wound Dressing one or multiple wounds ?[]  - 0 ?Large Wound Dressing one or multiple wounds ?[]  - 0 ?Application of Medications - topical ?[]  - 0 ?Application of Medications - injection ?INTERVENTIONS - Miscellaneous ?[]  - 0 ?External ear exam ?[]  - 0 ?Specimen Collection (cultures, biopsies, blood, body fluids, etc.) ?[]  - 0 ?Specimen(s) / Culture(s) sent or taken to Lab for analysis ?[]  - 0 ?Patient Transfer (multiple staff / Civil Service fast streamer / Similar devices) ?[]  - 0 ?Simple Staple / Suture removal (25 or less) ?[]  - 0 ?Complex Staple / Suture removal (26 or more) ?[]  - 0 ?Hypo / Hyperglycemic Management (close monitor of Blood Glucose) ?[]  - 0 ?Ankle / Brachial Index (ABI) - do not check if billed separately ?X- 1 5 ?Vital Signs ?Has the patient been seen at the hospital within the last three years: Yes ?Total Score: 95 ?Level Of Care: New/Established - Level 3 ?Electronic Signature(s) ?Signed: 05/29/2021 4:35:21 PM By: Lorrin Jackson ?Entered By: Lorrin Jackson on 05/29/2021 11:04:29 ?-------------------------------------------------------------------------------- ?Lower Extremity Assessment Details ?Patient Name: Date of Service: ?[name], [name] 05/29/2021 9:30 A M ?Medical Record Number: [id number] ?Patient Account Number: [id number] ?Date of Birth/Sex: Treating RN: ?1970/10/23 (51 y.o. [name] ?Primary Care Yani Coventry: Alvester Chou Other Clinician: ?Referring Ruslan Mccabe: ?Treating Ashkan Chamberland/Extender: Kalman Shan ?Alvester Chou ?Weeks in  Treatment: 9 ?Electronic Signature(s) ?Signed: 05/29/2021 4:35:21 PM By: Lorrin Jackson ?Entered By: Lorrin Jackson on 05/29/2021 10:23:08 ?-------------------------------------------------------------------------------- ?Multi Wound Chart Details ?Patient Name: Date of Service: ?[name], [name] 05/29/2021 9:30 A M ?Medical Record Number: [id number] ?Patient Account Number: [id number] ?Date of Birth/Sex: Treating RN: ?1971/02/03 (51 y.o. F) ?Primary Care Fatina Sprankle: Alvester Chou Other Clinician: ?Referring Amity Roes: ?Treating Ozan Maclay/Extender: Kalman Shan ?Alvester Chou ?Weeks in Treatment: 9 ?Vital Signs ?Height(in): 62 ?Pulse(bpm): 67 ?Weight(lbs): 145 ?Blood Pressure(mmHg): 107/60 ?Body Mass Index(BMI): 26.5 ?Temperature(??F): 97.6 ?Respiratory Rate(breaths/min): 18 ?Photos: [N/A:N/A] ?Right Gluteus N/A N/A ?Wound Location: ?Pressure Injury N/A N/A ?Wounding Event: ?Pressure Ulcer N/A N/A ?Primary Etiology: ?Hypertension, Osteomyelitis, N/A N/A ?Comorbid History: ?Paraplegia ?12/09/2020 N/A N/A ?Date Acquired: ?72 N/A N/A ?Weeks of Treatment: ?Open N/A N/A ?Wound Status: ?No N/A N/A ?Wound Recurrence: ?5x4x4 N/A N/A ?Measurements L x W x D (cm) ?15.708 N/A N/A ?A (cm?) : ?rea ?62.832 N/A N/A ?Volume (cm?) : ?11.10% N/A N/A ?% Reduction in A rea: ?25.90% N/A N/A ?% Reduction in Volume: ?12 ?Starting Position 1 (o'clock): ?3 ?Ending Position 1 (o'clock): ?1.5 ?Maximum Distance 1 (cm): ?Yes N/A N/A ?Undermining: ?Category/Stage IV N/A N/A ?Classification: ?Large N/A N/A ?Exudate A mount: ?Serosanguineous N/A N/A ?Exudate Type: ?red, brown N/A N/A ?Exudate Color: ?Well defined, not attached N/A N/A ?Wound Margin: ?Large (67-100%) N/A N/A ?Granulation A mount: ?Red, Pink, Friable N/A N/A ?Granulation Quality: ?None Present (0%) N/A N/A ?Necrotic A mount: ?Fat Layer (Subcutaneous Tissue): Yes N/A N/A ?Exposed Structures: ?Fascia: No ?Tendon: No ?Muscle: No ?Joint: No ?Bone: No ?None N/A N/A ?Epithelialization: ?Treatment  Notes ?Electronic Signature(s) ?Signed: 05/29/2021 11:46:16 AM By: Kalman Shan DO ?Entered By: Kalman Shan on 05/29/2021 11:20:18 ?-------------------------------------------------------------------------

## 2021-06-01 DIAGNOSIS — I10 Essential (primary) hypertension: Secondary | ICD-10-CM | POA: Diagnosis not present

## 2021-06-01 DIAGNOSIS — G8220 Paraplegia, unspecified: Secondary | ICD-10-CM | POA: Diagnosis not present

## 2021-06-01 DIAGNOSIS — G35 Multiple sclerosis: Secondary | ICD-10-CM | POA: Diagnosis not present

## 2021-06-01 DIAGNOSIS — E440 Moderate protein-calorie malnutrition: Secondary | ICD-10-CM | POA: Diagnosis not present

## 2021-06-01 DIAGNOSIS — N8184 Pelvic muscle wasting: Secondary | ICD-10-CM | POA: Diagnosis not present

## 2021-06-01 DIAGNOSIS — M4628 Osteomyelitis of vertebra, sacral and sacrococcygeal region: Secondary | ICD-10-CM | POA: Diagnosis not present

## 2021-06-01 DIAGNOSIS — N179 Acute kidney failure, unspecified: Secondary | ICD-10-CM | POA: Diagnosis not present

## 2021-06-01 DIAGNOSIS — L89314 Pressure ulcer of right buttock, stage 4: Secondary | ICD-10-CM | POA: Diagnosis not present

## 2021-06-01 DIAGNOSIS — I700 Atherosclerosis of aorta: Secondary | ICD-10-CM | POA: Diagnosis not present

## 2021-06-05 DIAGNOSIS — G8220 Paraplegia, unspecified: Secondary | ICD-10-CM | POA: Diagnosis not present

## 2021-06-05 DIAGNOSIS — N8184 Pelvic muscle wasting: Secondary | ICD-10-CM | POA: Diagnosis not present

## 2021-06-05 DIAGNOSIS — N179 Acute kidney failure, unspecified: Secondary | ICD-10-CM | POA: Diagnosis not present

## 2021-06-05 DIAGNOSIS — E440 Moderate protein-calorie malnutrition: Secondary | ICD-10-CM | POA: Diagnosis not present

## 2021-06-05 DIAGNOSIS — I10 Essential (primary) hypertension: Secondary | ICD-10-CM | POA: Diagnosis not present

## 2021-06-05 DIAGNOSIS — L89314 Pressure ulcer of right buttock, stage 4: Secondary | ICD-10-CM | POA: Diagnosis not present

## 2021-06-05 DIAGNOSIS — I700 Atherosclerosis of aorta: Secondary | ICD-10-CM | POA: Diagnosis not present

## 2021-06-05 DIAGNOSIS — G35 Multiple sclerosis: Secondary | ICD-10-CM | POA: Diagnosis not present

## 2021-06-05 DIAGNOSIS — M4628 Osteomyelitis of vertebra, sacral and sacrococcygeal region: Secondary | ICD-10-CM | POA: Diagnosis not present

## 2021-06-08 DIAGNOSIS — M4628 Osteomyelitis of vertebra, sacral and sacrococcygeal region: Secondary | ICD-10-CM | POA: Diagnosis not present

## 2021-06-08 DIAGNOSIS — L89314 Pressure ulcer of right buttock, stage 4: Secondary | ICD-10-CM | POA: Diagnosis not present

## 2021-06-08 DIAGNOSIS — I10 Essential (primary) hypertension: Secondary | ICD-10-CM | POA: Diagnosis not present

## 2021-06-08 DIAGNOSIS — N179 Acute kidney failure, unspecified: Secondary | ICD-10-CM | POA: Diagnosis not present

## 2021-06-08 DIAGNOSIS — E440 Moderate protein-calorie malnutrition: Secondary | ICD-10-CM | POA: Diagnosis not present

## 2021-06-08 DIAGNOSIS — I700 Atherosclerosis of aorta: Secondary | ICD-10-CM | POA: Diagnosis not present

## 2021-06-08 DIAGNOSIS — N8184 Pelvic muscle wasting: Secondary | ICD-10-CM | POA: Diagnosis not present

## 2021-06-08 DIAGNOSIS — G35 Multiple sclerosis: Secondary | ICD-10-CM | POA: Diagnosis not present

## 2021-06-08 DIAGNOSIS — G8220 Paraplegia, unspecified: Secondary | ICD-10-CM | POA: Diagnosis not present

## 2021-06-12 DIAGNOSIS — N179 Acute kidney failure, unspecified: Secondary | ICD-10-CM | POA: Diagnosis not present

## 2021-06-12 DIAGNOSIS — M4628 Osteomyelitis of vertebra, sacral and sacrococcygeal region: Secondary | ICD-10-CM | POA: Diagnosis not present

## 2021-06-12 DIAGNOSIS — G35 Multiple sclerosis: Secondary | ICD-10-CM | POA: Diagnosis not present

## 2021-06-12 DIAGNOSIS — I700 Atherosclerosis of aorta: Secondary | ICD-10-CM | POA: Diagnosis not present

## 2021-06-12 DIAGNOSIS — N8184 Pelvic muscle wasting: Secondary | ICD-10-CM | POA: Diagnosis not present

## 2021-06-12 DIAGNOSIS — E440 Moderate protein-calorie malnutrition: Secondary | ICD-10-CM | POA: Diagnosis not present

## 2021-06-12 DIAGNOSIS — I10 Essential (primary) hypertension: Secondary | ICD-10-CM | POA: Diagnosis not present

## 2021-06-12 DIAGNOSIS — L89314 Pressure ulcer of right buttock, stage 4: Secondary | ICD-10-CM | POA: Diagnosis not present

## 2021-06-12 DIAGNOSIS — G8220 Paraplegia, unspecified: Secondary | ICD-10-CM | POA: Diagnosis not present

## 2021-06-15 DIAGNOSIS — E440 Moderate protein-calorie malnutrition: Secondary | ICD-10-CM | POA: Diagnosis not present

## 2021-06-15 DIAGNOSIS — G8220 Paraplegia, unspecified: Secondary | ICD-10-CM | POA: Diagnosis not present

## 2021-06-15 DIAGNOSIS — L89314 Pressure ulcer of right buttock, stage 4: Secondary | ICD-10-CM | POA: Diagnosis not present

## 2021-06-15 DIAGNOSIS — G35 Multiple sclerosis: Secondary | ICD-10-CM | POA: Diagnosis not present

## 2021-06-15 DIAGNOSIS — M4628 Osteomyelitis of vertebra, sacral and sacrococcygeal region: Secondary | ICD-10-CM | POA: Diagnosis not present

## 2021-06-15 DIAGNOSIS — I10 Essential (primary) hypertension: Secondary | ICD-10-CM | POA: Diagnosis not present

## 2021-06-15 DIAGNOSIS — I700 Atherosclerosis of aorta: Secondary | ICD-10-CM | POA: Diagnosis not present

## 2021-06-15 DIAGNOSIS — N8184 Pelvic muscle wasting: Secondary | ICD-10-CM | POA: Diagnosis not present

## 2021-06-15 DIAGNOSIS — N179 Acute kidney failure, unspecified: Secondary | ICD-10-CM | POA: Diagnosis not present

## 2021-06-18 DIAGNOSIS — I700 Atherosclerosis of aorta: Secondary | ICD-10-CM | POA: Diagnosis not present

## 2021-06-18 DIAGNOSIS — L89314 Pressure ulcer of right buttock, stage 4: Secondary | ICD-10-CM | POA: Diagnosis not present

## 2021-06-18 DIAGNOSIS — G8220 Paraplegia, unspecified: Secondary | ICD-10-CM | POA: Diagnosis not present

## 2021-06-18 DIAGNOSIS — N179 Acute kidney failure, unspecified: Secondary | ICD-10-CM | POA: Diagnosis not present

## 2021-06-18 DIAGNOSIS — E440 Moderate protein-calorie malnutrition: Secondary | ICD-10-CM | POA: Diagnosis not present

## 2021-06-18 DIAGNOSIS — N8184 Pelvic muscle wasting: Secondary | ICD-10-CM | POA: Diagnosis not present

## 2021-06-18 DIAGNOSIS — I10 Essential (primary) hypertension: Secondary | ICD-10-CM | POA: Diagnosis not present

## 2021-06-18 DIAGNOSIS — G35 Multiple sclerosis: Secondary | ICD-10-CM | POA: Diagnosis not present

## 2021-06-18 DIAGNOSIS — M4628 Osteomyelitis of vertebra, sacral and sacrococcygeal region: Secondary | ICD-10-CM | POA: Diagnosis not present

## 2021-06-22 DIAGNOSIS — L89314 Pressure ulcer of right buttock, stage 4: Secondary | ICD-10-CM | POA: Diagnosis not present

## 2021-06-22 DIAGNOSIS — N179 Acute kidney failure, unspecified: Secondary | ICD-10-CM | POA: Diagnosis not present

## 2021-06-22 DIAGNOSIS — E440 Moderate protein-calorie malnutrition: Secondary | ICD-10-CM | POA: Diagnosis not present

## 2021-06-22 DIAGNOSIS — G35 Multiple sclerosis: Secondary | ICD-10-CM | POA: Diagnosis not present

## 2021-06-22 DIAGNOSIS — I10 Essential (primary) hypertension: Secondary | ICD-10-CM | POA: Diagnosis not present

## 2021-06-22 DIAGNOSIS — I700 Atherosclerosis of aorta: Secondary | ICD-10-CM | POA: Diagnosis not present

## 2021-06-22 DIAGNOSIS — G8220 Paraplegia, unspecified: Secondary | ICD-10-CM | POA: Diagnosis not present

## 2021-06-22 DIAGNOSIS — M4628 Osteomyelitis of vertebra, sacral and sacrococcygeal region: Secondary | ICD-10-CM | POA: Diagnosis not present

## 2021-06-22 DIAGNOSIS — N8184 Pelvic muscle wasting: Secondary | ICD-10-CM | POA: Diagnosis not present

## 2021-06-26 DIAGNOSIS — N179 Acute kidney failure, unspecified: Secondary | ICD-10-CM | POA: Diagnosis not present

## 2021-06-26 DIAGNOSIS — G35 Multiple sclerosis: Secondary | ICD-10-CM | POA: Diagnosis not present

## 2021-06-26 DIAGNOSIS — G8220 Paraplegia, unspecified: Secondary | ICD-10-CM | POA: Diagnosis not present

## 2021-06-26 DIAGNOSIS — N8184 Pelvic muscle wasting: Secondary | ICD-10-CM | POA: Diagnosis not present

## 2021-06-26 DIAGNOSIS — E440 Moderate protein-calorie malnutrition: Secondary | ICD-10-CM | POA: Diagnosis not present

## 2021-06-26 DIAGNOSIS — I700 Atherosclerosis of aorta: Secondary | ICD-10-CM | POA: Diagnosis not present

## 2021-06-26 DIAGNOSIS — L89314 Pressure ulcer of right buttock, stage 4: Secondary | ICD-10-CM | POA: Diagnosis not present

## 2021-06-26 DIAGNOSIS — I10 Essential (primary) hypertension: Secondary | ICD-10-CM | POA: Diagnosis not present

## 2021-06-26 DIAGNOSIS — M4628 Osteomyelitis of vertebra, sacral and sacrococcygeal region: Secondary | ICD-10-CM | POA: Diagnosis not present

## 2021-06-29 DIAGNOSIS — E440 Moderate protein-calorie malnutrition: Secondary | ICD-10-CM | POA: Diagnosis not present

## 2021-06-29 DIAGNOSIS — G35 Multiple sclerosis: Secondary | ICD-10-CM | POA: Diagnosis not present

## 2021-06-29 DIAGNOSIS — G8220 Paraplegia, unspecified: Secondary | ICD-10-CM | POA: Diagnosis not present

## 2021-06-29 DIAGNOSIS — L89314 Pressure ulcer of right buttock, stage 4: Secondary | ICD-10-CM | POA: Diagnosis not present

## 2021-06-29 DIAGNOSIS — M4628 Osteomyelitis of vertebra, sacral and sacrococcygeal region: Secondary | ICD-10-CM | POA: Diagnosis not present

## 2021-06-29 DIAGNOSIS — I700 Atherosclerosis of aorta: Secondary | ICD-10-CM | POA: Diagnosis not present

## 2021-06-29 DIAGNOSIS — N179 Acute kidney failure, unspecified: Secondary | ICD-10-CM | POA: Diagnosis not present

## 2021-06-29 DIAGNOSIS — I10 Essential (primary) hypertension: Secondary | ICD-10-CM | POA: Diagnosis not present

## 2021-06-29 DIAGNOSIS — N8184 Pelvic muscle wasting: Secondary | ICD-10-CM | POA: Diagnosis not present

## 2021-07-02 DIAGNOSIS — E440 Moderate protein-calorie malnutrition: Secondary | ICD-10-CM | POA: Diagnosis not present

## 2021-07-02 DIAGNOSIS — I700 Atherosclerosis of aorta: Secondary | ICD-10-CM | POA: Diagnosis not present

## 2021-07-02 DIAGNOSIS — I10 Essential (primary) hypertension: Secondary | ICD-10-CM | POA: Diagnosis not present

## 2021-07-02 DIAGNOSIS — N8184 Pelvic muscle wasting: Secondary | ICD-10-CM | POA: Diagnosis not present

## 2021-07-02 DIAGNOSIS — N179 Acute kidney failure, unspecified: Secondary | ICD-10-CM | POA: Diagnosis not present

## 2021-07-02 DIAGNOSIS — G8220 Paraplegia, unspecified: Secondary | ICD-10-CM | POA: Diagnosis not present

## 2021-07-02 DIAGNOSIS — L89314 Pressure ulcer of right buttock, stage 4: Secondary | ICD-10-CM | POA: Diagnosis not present

## 2021-07-02 DIAGNOSIS — G35 Multiple sclerosis: Secondary | ICD-10-CM | POA: Diagnosis not present

## 2021-07-02 DIAGNOSIS — M4628 Osteomyelitis of vertebra, sacral and sacrococcygeal region: Secondary | ICD-10-CM | POA: Diagnosis not present

## 2021-07-06 DIAGNOSIS — I10 Essential (primary) hypertension: Secondary | ICD-10-CM | POA: Diagnosis not present

## 2021-07-06 DIAGNOSIS — N179 Acute kidney failure, unspecified: Secondary | ICD-10-CM | POA: Diagnosis not present

## 2021-07-06 DIAGNOSIS — M4628 Osteomyelitis of vertebra, sacral and sacrococcygeal region: Secondary | ICD-10-CM | POA: Diagnosis not present

## 2021-07-06 DIAGNOSIS — I700 Atherosclerosis of aorta: Secondary | ICD-10-CM | POA: Diagnosis not present

## 2021-07-06 DIAGNOSIS — G8220 Paraplegia, unspecified: Secondary | ICD-10-CM | POA: Diagnosis not present

## 2021-07-06 DIAGNOSIS — G35 Multiple sclerosis: Secondary | ICD-10-CM | POA: Diagnosis not present

## 2021-07-06 DIAGNOSIS — E440 Moderate protein-calorie malnutrition: Secondary | ICD-10-CM | POA: Diagnosis not present

## 2021-07-06 DIAGNOSIS — L89314 Pressure ulcer of right buttock, stage 4: Secondary | ICD-10-CM | POA: Diagnosis not present

## 2021-07-06 DIAGNOSIS — N8184 Pelvic muscle wasting: Secondary | ICD-10-CM | POA: Diagnosis not present

## 2021-07-09 DIAGNOSIS — N179 Acute kidney failure, unspecified: Secondary | ICD-10-CM | POA: Diagnosis not present

## 2021-07-09 DIAGNOSIS — L89314 Pressure ulcer of right buttock, stage 4: Secondary | ICD-10-CM | POA: Diagnosis not present

## 2021-07-09 DIAGNOSIS — E440 Moderate protein-calorie malnutrition: Secondary | ICD-10-CM | POA: Diagnosis not present

## 2021-07-09 DIAGNOSIS — N8184 Pelvic muscle wasting: Secondary | ICD-10-CM | POA: Diagnosis not present

## 2021-07-09 DIAGNOSIS — I700 Atherosclerosis of aorta: Secondary | ICD-10-CM | POA: Diagnosis not present

## 2021-07-09 DIAGNOSIS — G35 Multiple sclerosis: Secondary | ICD-10-CM | POA: Diagnosis not present

## 2021-07-09 DIAGNOSIS — I10 Essential (primary) hypertension: Secondary | ICD-10-CM | POA: Diagnosis not present

## 2021-07-09 DIAGNOSIS — G8220 Paraplegia, unspecified: Secondary | ICD-10-CM | POA: Diagnosis not present

## 2021-07-09 DIAGNOSIS — M4628 Osteomyelitis of vertebra, sacral and sacrococcygeal region: Secondary | ICD-10-CM | POA: Diagnosis not present

## 2021-07-13 DIAGNOSIS — E440 Moderate protein-calorie malnutrition: Secondary | ICD-10-CM | POA: Diagnosis not present

## 2021-07-13 DIAGNOSIS — I700 Atherosclerosis of aorta: Secondary | ICD-10-CM | POA: Diagnosis not present

## 2021-07-13 DIAGNOSIS — I10 Essential (primary) hypertension: Secondary | ICD-10-CM | POA: Diagnosis not present

## 2021-07-13 DIAGNOSIS — N8184 Pelvic muscle wasting: Secondary | ICD-10-CM | POA: Diagnosis not present

## 2021-07-13 DIAGNOSIS — L89314 Pressure ulcer of right buttock, stage 4: Secondary | ICD-10-CM | POA: Diagnosis not present

## 2021-07-13 DIAGNOSIS — G35 Multiple sclerosis: Secondary | ICD-10-CM | POA: Diagnosis not present

## 2021-07-13 DIAGNOSIS — N179 Acute kidney failure, unspecified: Secondary | ICD-10-CM | POA: Diagnosis not present

## 2021-07-13 DIAGNOSIS — G8220 Paraplegia, unspecified: Secondary | ICD-10-CM | POA: Diagnosis not present

## 2021-07-13 DIAGNOSIS — M4628 Osteomyelitis of vertebra, sacral and sacrococcygeal region: Secondary | ICD-10-CM | POA: Diagnosis not present

## 2021-07-17 DIAGNOSIS — L89314 Pressure ulcer of right buttock, stage 4: Secondary | ICD-10-CM | POA: Diagnosis not present

## 2021-07-17 DIAGNOSIS — M4628 Osteomyelitis of vertebra, sacral and sacrococcygeal region: Secondary | ICD-10-CM | POA: Diagnosis not present

## 2021-07-17 DIAGNOSIS — I700 Atherosclerosis of aorta: Secondary | ICD-10-CM | POA: Diagnosis not present

## 2021-07-17 DIAGNOSIS — G35 Multiple sclerosis: Secondary | ICD-10-CM | POA: Diagnosis not present

## 2021-07-17 DIAGNOSIS — E440 Moderate protein-calorie malnutrition: Secondary | ICD-10-CM | POA: Diagnosis not present

## 2021-07-17 DIAGNOSIS — I10 Essential (primary) hypertension: Secondary | ICD-10-CM | POA: Diagnosis not present

## 2021-07-17 DIAGNOSIS — N179 Acute kidney failure, unspecified: Secondary | ICD-10-CM | POA: Diagnosis not present

## 2021-07-17 DIAGNOSIS — G8220 Paraplegia, unspecified: Secondary | ICD-10-CM | POA: Diagnosis not present

## 2021-07-17 DIAGNOSIS — N8184 Pelvic muscle wasting: Secondary | ICD-10-CM | POA: Diagnosis not present

## 2021-07-20 DIAGNOSIS — I700 Atherosclerosis of aorta: Secondary | ICD-10-CM | POA: Diagnosis not present

## 2021-07-20 DIAGNOSIS — E440 Moderate protein-calorie malnutrition: Secondary | ICD-10-CM | POA: Diagnosis not present

## 2021-07-20 DIAGNOSIS — L89314 Pressure ulcer of right buttock, stage 4: Secondary | ICD-10-CM | POA: Diagnosis not present

## 2021-07-20 DIAGNOSIS — G35 Multiple sclerosis: Secondary | ICD-10-CM | POA: Diagnosis not present

## 2021-07-20 DIAGNOSIS — N8184 Pelvic muscle wasting: Secondary | ICD-10-CM | POA: Diagnosis not present

## 2021-07-20 DIAGNOSIS — I10 Essential (primary) hypertension: Secondary | ICD-10-CM | POA: Diagnosis not present

## 2021-07-20 DIAGNOSIS — G8220 Paraplegia, unspecified: Secondary | ICD-10-CM | POA: Diagnosis not present

## 2021-07-20 DIAGNOSIS — N179 Acute kidney failure, unspecified: Secondary | ICD-10-CM | POA: Diagnosis not present

## 2021-07-20 DIAGNOSIS — M4628 Osteomyelitis of vertebra, sacral and sacrococcygeal region: Secondary | ICD-10-CM | POA: Diagnosis not present

## 2021-07-23 DIAGNOSIS — N8184 Pelvic muscle wasting: Secondary | ICD-10-CM | POA: Diagnosis not present

## 2021-07-23 DIAGNOSIS — G35 Multiple sclerosis: Secondary | ICD-10-CM | POA: Diagnosis not present

## 2021-07-23 DIAGNOSIS — N179 Acute kidney failure, unspecified: Secondary | ICD-10-CM | POA: Diagnosis not present

## 2021-07-23 DIAGNOSIS — L89314 Pressure ulcer of right buttock, stage 4: Secondary | ICD-10-CM | POA: Diagnosis not present

## 2021-07-23 DIAGNOSIS — M4628 Osteomyelitis of vertebra, sacral and sacrococcygeal region: Secondary | ICD-10-CM | POA: Diagnosis not present

## 2021-07-23 DIAGNOSIS — I700 Atherosclerosis of aorta: Secondary | ICD-10-CM | POA: Diagnosis not present

## 2021-07-23 DIAGNOSIS — G8220 Paraplegia, unspecified: Secondary | ICD-10-CM | POA: Diagnosis not present

## 2021-07-23 DIAGNOSIS — E440 Moderate protein-calorie malnutrition: Secondary | ICD-10-CM | POA: Diagnosis not present

## 2021-07-23 DIAGNOSIS — I10 Essential (primary) hypertension: Secondary | ICD-10-CM | POA: Diagnosis not present

## 2021-07-24 ENCOUNTER — Other Ambulatory Visit (HOSPITAL_COMMUNITY): Payer: Self-pay

## 2021-07-24 ENCOUNTER — Other Ambulatory Visit (HOSPITAL_COMMUNITY): Payer: Self-pay | Admitting: Internal Medicine

## 2021-07-24 ENCOUNTER — Encounter (HOSPITAL_BASED_OUTPATIENT_CLINIC_OR_DEPARTMENT_OTHER): Payer: Medicare PPO | Attending: Internal Medicine | Admitting: Internal Medicine

## 2021-07-24 ENCOUNTER — Ambulatory Visit (HOSPITAL_COMMUNITY)
Admission: RE | Admit: 2021-07-24 | Discharge: 2021-07-24 | Disposition: A | Discharge status: Home / Self Care | Payer: Medicare PPO | Source: Ambulatory Visit | Attending: Internal Medicine | Admitting: Internal Medicine

## 2021-07-24 DIAGNOSIS — Z87891 Personal history of nicotine dependence: Secondary | ICD-10-CM | POA: Diagnosis not present

## 2021-07-24 DIAGNOSIS — M4628 Osteomyelitis of vertebra, sacral and sacrococcygeal region: Secondary | ICD-10-CM | POA: Diagnosis not present

## 2021-07-24 DIAGNOSIS — L89314 Pressure ulcer of right buttock, stage 4: Secondary | ICD-10-CM | POA: Diagnosis present

## 2021-07-24 DIAGNOSIS — M869 Osteomyelitis, unspecified: Secondary | ICD-10-CM | POA: Insufficient documentation

## 2021-07-24 DIAGNOSIS — G35 Multiple sclerosis: Secondary | ICD-10-CM | POA: Insufficient documentation

## 2021-07-24 NOTE — Progress Notes (Signed)
[name], [name] ([id number]) ?Visit Report for 07/24/2021 ?Arrival Information Details ?Patient Name: Date of Service: ?[name], [name] 07/24/2021 9:30 A M ?Medical Record Number: [id number] ?Patient Account Number: [id number] ?Date of Birth/Sex: Treating RN: ?[birth year] (51 y.o. Roel Cluck ?Primary Care Zebbie Ace: Marletta Lor Other Clinician: ?Referring Brelyn Woehl: ?Treating Tieara Flitton/Extender: Geralyn Corwin ?Marletta Lor ?Weeks in Treatment: 17 ?Visit Information History Since Last Visit ?Added or deleted any medications: No ?Patient Arrived: Stretcher ?Any new allergies or adverse reactions: No ?Arrival Time: 09:46 ?Had a fall or experienced change in No ?Transfer Assistance: None ?activities of daily living that may affect ?Patient Identification Verified: Yes ?risk of falls: ?Secondary Verification Process Completed: Yes ?Signs or symptoms of abuse/neglect since last visito No ?Patient Requires Transmission-Based Precautions: No ?Hospitalized since last visit: No ?Patient Has Alerts: No ?Implantable device outside of the clinic excluding No ?cellular tissue based products placed in the center ?since last visit: ?Has Dressing in Place as Prescribed: Yes ?Pain Present Now: No ?Electronic Signature(s) ?Signed: 07/24/2021 5:12:39 PM By: Antonieta Iba ?Entered By: Antonieta Iba on 07/24/2021 09:46:38 ?-------------------------------------------------------------------------------- ?Clinic Level of Care Assessment Details ?Patient Name: Date of Service: ?[name], [name] 07/24/2021 9:30 A M ?Medical Record Number: [id number] ?Patient Account Number: [id number] ?Date of Birth/Sex: Treating RN: ?28-Nov-1970 (51 y.o. Roel Cluck ?Primary Care Kyo Cocuzza: Marletta Lor Other Clinician: ?Referring Hermione Havlicek: ?Treating Jasiyah Paulding/Extender: Geralyn Corwin ?Marletta Lor ?Weeks in Treatment: 17 ?Clinic Level of Care Assessment Items ?TOOL 4 Quantity Score ?X- 1 0 ?Use when only an EandM is performed on FOLLOW-UP  visit ?ASSESSMENTS - Nursing Assessment / Reassessment ?X- 1 10 ?Reassessment of Co-morbidities (includes updates in patient status) ?X- 1 5 ?Reassessment of Adherence to Treatment Plan ?ASSESSMENTS - Wound and Skin A ssessment / Reassessment ?X - Simple Wound Assessment / Reassessment - one wound 1 5 ?[]  - 0 ?Complex Wound Assessment / Reassessment - multiple wounds ?[]  - 0 ?Dermatologic / Skin Assessment (not related to wound area) ?ASSESSMENTS - Focused Assessment ?[]  - 0 ?Circumferential Edema Measurements - multi extremities ?[]  - 0 ?Nutritional Assessment / Counseling / Intervention ?[]  - 0 ?Lower Extremity Assessment (monofilament, tuning fork, pulses) ?[]  - 0 ?Peripheral Arterial Disease Assessment (using hand held doppler) ?ASSESSMENTS - Ostomy and/or Continence Assessment and Care ?[]  - 0 ?Incontinence Assessment and Management ?[]  - 0 ?Ostomy Care Assessment and Management (repouching, etc.) ?PROCESS - Coordination of Care ?[]  - 0 ?Simple Patient / Family Education for ongoing care ?X- 1 20 ?Complex (extensive) Patient / Family Education for ongoing care ?[]  - 0 ?Staff obtains Consents, Records, T Results / Process Orders ?est ?X- 1 10 ?Staff telephones HHA, Nursing Homes / Clarify orders / etc ?[]  - 0 ?Routine Transfer to another Facility (non-emergent condition) ?[]  - 0 ?Routine Hospital Admission (non-emergent condition) ?[]  - 0 ?New Admissions / / Ordering NPWT Apligraf, etc. ?, ?[]  - 0 ?Emergency Hospital Admission (emergent condition) ?[]  - 0 ?Simple Discharge Coordination ?[]  - 0 ?Complex (extensive) Discharge Coordination ?PROCESS - Special Needs ?[]  - 0 ?Pediatric / Minor Patient Management ?[]  - 0 ?Isolation Patient Management ?[]  - 0 ?Hearing / Language / Visual special needs ?[]  - 0 ?Assessment of Community assistance (transportation, D/C planning, etc.) ?[]  - 0 ?Additional assistance / Altered mentation ?[]  - 0 ?Support Surface(s) Assessment (bed, cushion, seat,  etc.) ?INTERVENTIONS - Wound Cleansing / Measurement ?X - Simple Wound Cleansing - one wound 1 5 ?[]  - 0 ?Complex Wound Cleansing - multiple wounds ?X- 1  5 ?Wound Imaging (photographs - any number of wounds) ?[]  - 0 ?Wound Tracing (instead of photographs) ?X- 1 5 ?Simple Wound Measurement - one wound ?[]  - 0 ?Complex Wound Measurement - multiple wounds ?INTERVENTIONS - Wound Dressings ?[]  - 0 ?Small Wound Dressing one or multiple wounds ?X- 1 15 ?Medium Wound Dressing one or multiple wounds ?[]  - 0 ?Large Wound Dressing one or multiple wounds ?[]  - 0 ?Application of Medications - topical ?[]  - 0 ?Application of Medications - injection ?INTERVENTIONS - Miscellaneous ?[]  - 0 ?External ear exam ?[]  - 0 ?Specimen Collection (cultures, biopsies, blood, body fluids, etc.) ?[]  - 0 ?Specimen(s) / Culture(s) sent or taken to Lab for analysis ?[]  - 0 ?Patient Transfer (multiple staff / / Similar devices) ?[]  - 0 ?Simple Staple / Suture removal (25 or less) ?[]  - 0 ?Complex Staple / Suture removal (26 or more) ?[]  - 0 ?Hypo / Hyperglycemic Management (close monitor of Blood Glucose) ?[]  - 0 ?Ankle / Brachial Index (ABI) - do not check if billed separately ?X- 1 5 ?Vital Signs ?Has the patient been seen at the hospital within the last three years: Yes ?Total Score: 85 ?Level Of Care: New/Established - Level 3 ?Electronic Signature(s) ?Signed: 07/24/2021 5:12:39 PM By: ?Entered By: on 07/24/2021 10:43:43 ?-------------------------------------------------------------------------------- ?Encounter Discharge Information Details ?Patient Name: Date of Service: ?[name], [name] 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 ?Weeks in Treatment:  17 ?Encounter Discharge Information Items ?Discharge Condition: Stable ?Ambulatory Status: Stretcher ?Discharge Destination: Home ?Transportation: Ambulance ?Schedule Follow-up Appointment: Yes ?Clinical Summary of Care: Provided on 07/24/2021 ?Form Type Recipient ?Paper Patient Patient ?Electronic Signature(s) ?Signed: 07/24/2021 5:12:39 PM By: Antonieta Iba ?Entered By: Antonieta Iba on 07/24/2021 10:44:31 ?-------------------------------------------------------------------------------- ?Lower Extremity Assessment Details ?Patient Name: Date of Service: ?[name], [name] 07/24/2021 9:30 A M ?Medical Record Number: [id number] ?Patient Account Number: [id number] ?Date of Birth/Sex: Treating RN: ?26-Jun-1970 (52 y.o. Roel Cluck ?Primary Care Lataja Newland: Marletta Lor Other Clinician: ?Referring Vearl Allbaugh: ?Treating Becki Mccaskill/Extender: Geralyn Corwin ?Marletta Lor ?Weeks in Treatment: 17 ?Electronic Signature(s) ?Signed: 07/24/2021 5:12:39 PM By: 07/26/2021 ?Entered By: Antonieta Iba on 07/24/2021 09:51:20 ?-------------------------------------------------------------------------------- ?Multi Wound Chart Details ?Patient Name: ?Date of Service: ?[name], [name] 07/24/2021 9:30 A M ?Medical Record Number: 07/26/2021 ?Patient Account Number: [id number] ?Date of Birth/Sex: ?Treating RN: ?06/05/1970 (51 y.o. 44 ?Primary Care Demiya Magno: Roel Cluck ?Other Clinician: ?Referring Riyad Keena: ?Treating Princeston Blizzard/Extender: Marletta Lor ?Geralyn Corwin ?Weeks in Treatment: 17 ?Vital Signs ?Height(in): 62 ?Pulse(bpm): 77 ?Weight(lbs): 145 ?Blood Pressure(mmHg): 128/71 ?Body Mass Index(BMI): 26.5 ?Temperature(??F): 98.3 ?Respiratory Rate(breaths/min): 18 ?Photos: [N/A:N/A] ?Right Gluteus N/A N/A ?Wound Location: ?Pressure Injury N/A N/A ?Wounding Event: ?Pressure Ulcer N/A N/A ?Primary Etiology: ?Hypertension, Osteomyelitis, N/A N/A ?Comorbid History: ?Paraplegia ?12/09/2020 N/A N/A ?Date Acquired: ?62 N/A N/A ?Weeks  of Treatment: ?Open N/A N/A ?Wound Status: ?No N/A N/A ?Wound Recurrence: ?5x4.5x4 N/A N/A ?Measurements L x W x D (cm) ?17.671 N/A N/A ?A (cm?) : ?rea ?70.686 N/A N/A ?Volume (cm?) : ?0.00% N/A N/A ?% Reduction in A rea:

## 2021-07-24 NOTE — Progress Notes (Signed)
[name], [name] ([id number]) ?Visit Report for 07/24/2021 ?Chief Complaint Document Details ?Patient Name: Date of Service: ?[name], [name] 07/24/2021 9:30 A M ?Medical Record Number: [id number] ?Patient Account Number: [id number] ?Date of Birth/Sex: Treating RN: ?1970-06-20 (51 y.o. [name] ?Primary Care Provider: Alvester Chou Other Clinician: ?Referring Provider: ?Treating Provider/Extender: Kalman Shan ?Alvester Chou ?Weeks in Treatment: 17 ?Information Obtained from: Patient ?Chief Complaint ?Right gluteus wound ?Electronic Signature(s) ?Signed: 07/24/2021 12:53:05 PM By: Kalman Shan DO ?Entered By: Kalman Shan on 07/24/2021 12:07:13 ?-------------------------------------------------------------------------------- ?HPI Details ?Patient Name: Date of Service: ?[name], [name] 07/24/2021 9:30 A M ?Medical Record Number: [id number] ?Patient Account Number: [id number] ?Date of Birth/Sex: Treating RN: ?Sep 01, 1970 (51 y.o. [name] ?Primary Care Provider: Alvester Chou Other Clinician: ?Referring Provider: ?Treating Provider/Extender: Kalman Shan ?Alvester Chou ?Weeks in Treatment: 17 ?History of Present Illness ?HPI Description: Admission 03/26/2021 ?[name] is a 51 year old female with a past medical history of multiple sclerosis for the past 16 years, complicated by severe debility and bedbound ?since 2015 that presents to the clinic for a 49-month history of ulcer to the right buttocks. She has been admitted to the hospital several times for this issue. ?She was last hospitalized on 02/18/2021 for septic shock due to the infected ulcer. She was evaluated by infectious disease at that time. She completed a 2- ?week course of antibiotics. There is no plan for follow-up. She is currently using calcium alginate dressings. She has home health that comes out twice weekly. ?She currently denies signs of infection. She has an air mattress for offloading. Of note She has a history of  osteomyelitis to the sacrum and treated in 2020 ?with IV antibiotics for 8 weeks. ?3/21; patient presents for follow-up. She has been using calcium alginate to the wound bed and using Vashe to help clean prior to dressing changes. She has no ?issues or complaints today. ?5/16; patient presents for follow-up. She has been using calcium alginate to the wound bed. She has no issues or complaints today. She is not able to ?aggressively offload the area due to her multiple sclerosis. ?Electronic Signature(s) ?Signed: 07/24/2021 12:53:05 PM By: Kalman Shan DO ?Entered By: Kalman Shan on 07/24/2021 12:07:51 ?-------------------------------------------------------------------------------- ?Physical Exam Details ?Patient Name: Date of Service: ?[name], [name] 07/24/2021 9:30 A M ?Medical Record Number: [id number] ?Patient Account Number: [id number] ?Date of Birth/Sex: Treating RN: ?12-29-1970 (51 y.o. [name] ?Primary Care Provider: Alvester Chou Other Clinician: ?Referring Provider: ?Treating Provider/Extender: Kalman Shan ?Alvester Chou ?Weeks in Treatment: 17 ?Constitutional ?respirations regular, non-labored and within target range for patient.Marland Kitchen ?Psychiatric ?pleasant and cooperative. ?Notes ?Right buttocks: Open wound with granulation tissue and undermining. This overlies very closely to bone. No bone exposed. Surrounding skin intact. No rashes. ?No signs of surrounding soft tissue infection. ?Electronic Signature(s) ?Signed: 07/24/2021 12:53:05 PM By: Kalman Shan DO ?Entered By: Kalman Shan on 07/24/2021 12:12:28 ?-------------------------------------------------------------------------------- ?Physician Orders Details ?Patient Name: Date of Service: ?[name], [name] 07/24/2021 9:30 A M ?Medical Record Number: [id number] ?Patient Account Number: [id number] ?Date of Birth/Sex: Treating RN: ?[birth year] (51 y.o. [name] ?Primary Care Provider: Alvester Chou Other Clinician: ?Referring  Provider: ?Treating Provider/Extender: Kalman Shan ?Alvester Chou ?Weeks in Treatment: 17 ?Verbal / Phone Orders: No ?Diagnosis Coding ?Follow-up Appointments ?ppointment in: - 09/25/21 @ 8:45am with Dr. Heber [place] [facility], Room 7) ?Return A ?Bathing/ Shower/ Hygiene ?May shower with protection but do not get wound dressing(s) wet. ?Off-Loading ?Low air-loss mattress (Group 2) ?Turn  and reposition every 2 hours ?Home Health ?No change in wound care orders this week; continue Home Health for wound care. May utilize formulary equivalent dressing for wound ?treatment orders unless otherwise specified. ?Other Home Health Orders/Instructions: [name] ?Wound Treatment ?Wound #1 - Gluteus Wound Laterality: Right ?Cleanser: Vashe, 8.5 (oz) (Home Health) Every Other Day/30 Days ?Discharge Instructions: Clean wound with Vashe or Anasept ?Peri-Wound Care: Skin Prep [facility]) Every Other Day/30 Days ?Discharge Instructions: Use skin prep as directed ?Prim Dressing: Maxorb Extra Calcium Alginate Dressing, 4x4 in [facility]) Every Other Day/30 Days ?ary ?Discharge Instructions: Apply calcium alginate to wound bed as instructed ?Secondary Dressing: Zetuvit Plus Silicone Border Dressing 5x5 (in/in) (Home Health) Every Other Day/30 Days ?Discharge Instructions: Apply silicone border over primary dressing as directed. ?Radiology ?X-ray, other: Right Buttock/Pelvis - Xray of right buttock to r/o osteomyelitis CPT 72190 ICD:L89.314 ?: ?Electronic Signature(s) ?Signed: 07/24/2021 12:53:05 PM By: Kalman Shan DO ?Entered By: Kalman Shan on 07/24/2021 12:12:46 ?Prescription 07/24/2021 ?-------------------------------------------------------------------------------- ?[name], [name] Kalman Shan DO ?Patient Name: Provider: ?Jan 12, 1971 [id number] ?Date of Birth: NPI#: ?[id number] ?Sex: DEA #: ?[phone] [phone] [id number] ?Phone #: License #: ?[place] ?Patient Address: ?[address] ?[address], [place] [address] [address] ?[place], [place] [address] ?[phone] ?Allergies ?silver; tizanidine; tomato ?Provider's Orders ?X-ray, other: Right Buttock/Pelvis - Xray of right buttock to r/o osteomyelitis CPT 72190 ICD:L89.314 ?: ?Hand Signature: ?Date(s): ?Electronic Signature(s) ?Signed: 07/24/2021 12:53:05 PM By: Kalman Shan DO ?Entered By: Kalman Shan on 07/24/2021 12:12:46 ?-------------------------------------------------------------------------------- ?Problem List Details ?Patient Name: Date of Service: ?[name], Brittain L. 07/24/2021 9:30 A M ?Medical Record Number: [id number] ?Patient Account Number: [id number] ?Date of Birth/Sex: Treating RN: ?12/24/1970 (51 y.o. [name] ?Primary Care Provider: Alvester Chou Other Clinician: ?Referring Provider: ?Treating Provider/Extender: Kalman Shan ?Alvester Chou ?Weeks in Treatment: 17 ?Active Problems ?ICD-10 ?Encounter ?Code Description Active Date MDM ?Diagnosis ?L89.314 Pressure ulcer of right buttock, stage 4 03/26/2021 No Yes ?G35 Multiple sclerosis 03/26/2021 No Yes ?M46.28 Osteomyelitis of vertebra, sacral and sacrococcygeal region 03/26/2021 No Yes ?Inactive Problems ?Resolved Problems ?Electronic Signature(s) ?Signed: 07/24/2021 12:53:05 PM By: Kalman Shan DO ?Entered By: Kalman Shan on 07/24/2021 12:06:57 ?-------------------------------------------------------------------------------- ?Progress Note Details ?Patient Name: Date of Service: ?[name], Katheline L. 07/24/2021 9:30 A M ?Medical Record Number: [id number] ?Patient Account Number: [id number] ?Date of Birth/Sex: Treating RN: ?Aug 03, 1970 (51 y.o. [name] ?Primary Care Provider: Alvester Chou Other Clinician: ?Referring Provider: ?Treating Provider/Extender: Kalman Shan ?Alvester Chou ?Weeks in Treatment: 17 ?Subjective ?Chief Complaint ?Information obtained from Patient ?Right gluteus wound ?History of Present Illness (HPI) ?Admission  03/26/2021 ?[name] is a 51 year old female with a past medical history of multiple sclerosis for the past 16 years, complicated by severe debility and bedbound ?since 2015 that presents to the clinic for

## 2021-07-26 DIAGNOSIS — M4628 Osteomyelitis of vertebra, sacral and sacrococcygeal region: Secondary | ICD-10-CM | POA: Diagnosis not present

## 2021-07-26 DIAGNOSIS — E440 Moderate protein-calorie malnutrition: Secondary | ICD-10-CM | POA: Diagnosis not present

## 2021-07-26 DIAGNOSIS — N179 Acute kidney failure, unspecified: Secondary | ICD-10-CM | POA: Diagnosis not present

## 2021-07-26 DIAGNOSIS — I10 Essential (primary) hypertension: Secondary | ICD-10-CM | POA: Diagnosis not present

## 2021-07-26 DIAGNOSIS — G8220 Paraplegia, unspecified: Secondary | ICD-10-CM | POA: Diagnosis not present

## 2021-07-26 DIAGNOSIS — I700 Atherosclerosis of aorta: Secondary | ICD-10-CM | POA: Diagnosis not present

## 2021-07-26 DIAGNOSIS — N8184 Pelvic muscle wasting: Secondary | ICD-10-CM | POA: Diagnosis not present

## 2021-07-26 DIAGNOSIS — G35 Multiple sclerosis: Secondary | ICD-10-CM | POA: Diagnosis not present

## 2021-07-26 DIAGNOSIS — L89314 Pressure ulcer of right buttock, stage 4: Secondary | ICD-10-CM | POA: Diagnosis not present

## 2021-07-31 DIAGNOSIS — I10 Essential (primary) hypertension: Secondary | ICD-10-CM | POA: Diagnosis not present

## 2021-07-31 DIAGNOSIS — N179 Acute kidney failure, unspecified: Secondary | ICD-10-CM | POA: Diagnosis not present

## 2021-07-31 DIAGNOSIS — N8184 Pelvic muscle wasting: Secondary | ICD-10-CM | POA: Diagnosis not present

## 2021-07-31 DIAGNOSIS — I700 Atherosclerosis of aorta: Secondary | ICD-10-CM | POA: Diagnosis not present

## 2021-07-31 DIAGNOSIS — L89314 Pressure ulcer of right buttock, stage 4: Secondary | ICD-10-CM | POA: Diagnosis not present

## 2021-07-31 DIAGNOSIS — M4628 Osteomyelitis of vertebra, sacral and sacrococcygeal region: Secondary | ICD-10-CM | POA: Diagnosis not present

## 2021-07-31 DIAGNOSIS — E440 Moderate protein-calorie malnutrition: Secondary | ICD-10-CM | POA: Diagnosis not present

## 2021-07-31 DIAGNOSIS — G8220 Paraplegia, unspecified: Secondary | ICD-10-CM | POA: Diagnosis not present

## 2021-07-31 DIAGNOSIS — G35 Multiple sclerosis: Secondary | ICD-10-CM | POA: Diagnosis not present

## 2021-08-03 ENCOUNTER — Telehealth (INDEPENDENT_AMBULATORY_CARE_PROVIDER_SITE_OTHER): Payer: Medicare PPO | Admitting: Internal Medicine

## 2021-08-03 ENCOUNTER — Other Ambulatory Visit: Payer: Self-pay

## 2021-08-03 ENCOUNTER — Encounter: Payer: Self-pay | Admitting: Internal Medicine

## 2021-08-03 ENCOUNTER — Telehealth: Payer: Self-pay

## 2021-08-03 DIAGNOSIS — G8220 Paraplegia, unspecified: Secondary | ICD-10-CM | POA: Diagnosis not present

## 2021-08-03 DIAGNOSIS — N179 Acute kidney failure, unspecified: Secondary | ICD-10-CM | POA: Diagnosis not present

## 2021-08-03 DIAGNOSIS — I10 Essential (primary) hypertension: Secondary | ICD-10-CM | POA: Diagnosis not present

## 2021-08-03 DIAGNOSIS — I700 Atherosclerosis of aorta: Secondary | ICD-10-CM | POA: Diagnosis not present

## 2021-08-03 DIAGNOSIS — E440 Moderate protein-calorie malnutrition: Secondary | ICD-10-CM | POA: Diagnosis not present

## 2021-08-03 DIAGNOSIS — G35 Multiple sclerosis: Secondary | ICD-10-CM | POA: Diagnosis not present

## 2021-08-03 DIAGNOSIS — N8184 Pelvic muscle wasting: Secondary | ICD-10-CM | POA: Diagnosis not present

## 2021-08-03 DIAGNOSIS — L89314 Pressure ulcer of right buttock, stage 4: Secondary | ICD-10-CM | POA: Diagnosis not present

## 2021-08-03 DIAGNOSIS — M4628 Osteomyelitis of vertebra, sacral and sacrococcygeal region: Secondary | ICD-10-CM | POA: Diagnosis not present

## 2021-08-03 MED ORDER — DOXYCYCLINE MONOHYDRATE 100 MG PO CAPS: 100.0000 mg | ORAL_CAPSULE | Freq: Two times a day (BID) | ORAL | 0 refills | Status: DC

## 2021-08-03 MED ORDER — LEVOFLOXACIN 500 MG PO TABS: 500.0000 mg | ORAL_TABLET | Freq: Every day | ORAL | 0 refills | Status: DC

## 2021-08-03 NOTE — Progress Notes (Signed)
[facility]   I connected with  [name] on 08/03/21 by a video enabled telemedicine application and verified that I am speaking with the correct person using two identifiers.   I discussed the limitations of evaluation and management by telemedicine. The patient expressed understanding and agreed to proceed.  Location: Patient - home Physician - clinic  Duration of visit:  45 minutes   Reason for Consult:possible osteomyelitis    Referring Physician: Dr. Heber [place]    Patient ID: [name], female    DOB: 1971-01-20, 51 y.o.   MRN: [id number]  HPI:   She is seen for concern for osteomyelitis. This is a new problem of concern for the bone infection due to the lytic lesion.   She is followed by Dr. Magdalene River for wound care of her right buttock ulcer that started in September 2022.  She has a history of MS and is bedbound at this time and cared for by her mother.  The wound started and progressed until her hospitalization in December and she was then treated for a wound infection with doxycycline and levaquin.  No bone involvement at that time.  Her wound began to heal but over the last 1-2 months has not progressed.  By wound care report, there is no exposed bone.  She has had no surrounding erythema, no pus or other concerns.  No fever.    Past Medical History:  Diagnosis Date   Arthritis    MS (multiple sclerosis) ([place])     Prior to Admission medications   Medication Sig Start Date End Date Taking? Authorizing Provider  acetaminophen (TYLENOL) 500 MG tablet Take 1,000 mg by mouth every 6 (six) hours as needed.   Yes [provider]  baclofen (LIORESAL) 20 MG tablet Take 20 mg by mouth 3 (three) times daily. 11/05/18  Yes [provider]  busPIRone (BUSPAR) 5 MG tablet Take by mouth. 05/12/21  Yes [provider]  doxycycline (MONODOX) 100 MG capsule Take 1 capsule (100 mg total) by mouth 2 (two) times daily. 08/03/21   Yes Oluwadarasimi Redmon, Okey Regal, MD  gabapentin (NEURONTIN) 300 MG capsule Take 300-600 mg by mouth See admin instructions. 300 mg  morning, noon and dinner and 2 capsules (67m) at bedtime. 12/14/18  Yes [provider]  levofloxacin (LEVAQUIN) 500 MG tablet Take 1 tablet (500 mg total) by mouth daily. 08/03/21  Yes Everette Mall, ROkey Regal MD  meloxicam (MOBIC) 7.5 MG tablet Take 7.5 mg by mouth daily. 11/05/18  Yes [provider]  mineral oil-hydrophilic petrolatum (AQUAPHOR) ointment Apply topically as needed.   Yes [provider]  Multiple Vitamins-Minerals (ADULT GUMMY PO) Take by mouth daily.   Yes [provider]  traMADol (ULTRAM) 50 MG tablet Take 1 tablet (50 mg total) by mouth every 12 (twelve) hours as needed for moderate pain. Patient taking differently: Take 50 mg by mouth every 12 (twelve) hours as needed for moderate pain. 02/22/21  Yes Ghimire, SHenreitta Leber MD  Zinc Oxide (TRIPLE PASTE) 12.8 % ointment Apply topically 2 (two) times daily. Apply AROUND sacral wound area. Then follow Dakin's order. Patient taking differently: Apply topically as needed for irritation. 12/25/20  Yes NMerrily Brittle DO  carvedilol (COREG) 3.125 MG tablet Take 3.125 mg by mouth 2 (two) times daily with a meal. Patient not taking: Reported on 08/03/2021    [provider]  diclofenac Sodium (VOLTAREN) 1 % GEL Apply 4 g topically 4 (four)  times daily. Apply to joint or muscle pains Patient not taking: Reported on 08/03/2021 12/25/20   Merrily Brittle, DO  feeding supplement, ENSURE ENLIVE, (ENSURE ENLIVE) LIQD Take 237 mLs by mouth 2 (two) times daily between meals. Patient not taking: Reported on 11/12/2020 02/01/19   Terrilee Croak, MD  ibuprofen (ADVIL) 200 MG tablet Take 400 mg by mouth every 6 (six) hours as needed for headache or moderate pain. Patient not taking: Reported on 08/03/2021    [provider]  Multiple Vitamin (MULTIVITAMIN) capsule Take 1 capsule by mouth  daily. Patient not taking: Reported on 08/03/2021    [provider]  protein supplement shake (PREMIER PROTEIN) LIQD Take 2 oz by mouth daily. chocolate Patient not taking: Reported on 08/03/2021    [provider]  senna-docusate (SENOKOT-S) 8.6-50 MG tablet Take 2 tablets by mouth 2 (two) times daily as needed for mild constipation. Patient not taking: Reported on 02/19/2021 11/13/20   Masters, Joellen Jersey, DO  vitamin C (ASCORBIC ACID) 500 MG tablet Take 500 mg by mouth daily. Patient not taking: Reported on 08/03/2021    [provider]    Allergies  Allergen Reactions   Silver    Tizanidine Diarrhea   Tomato     Social History   Tobacco Use   Smoking status: Former    Packs/day: 2.00    Years: 20.00    Pack years: 40.00    Types: Cigarettes    Quit date: 07/03/2008    Years since quitting: 13.0   Smokeless tobacco: Former    Quit date: 01/10/2019   Tobacco comments:    [place] quit 01/2019  Substance Use Topics   Alcohol use: No   Drug use: No    No family history on file.   Review of Systems  Constitutional: negative for fevers and chills Gastrointestinal: negative for diarrhea All other systems reviewed and are negative    Constitutional: in no apparent distress There were no vitals filed for this visit. Respiratory: normal respiratory effort  Labs: Lab Results  Component Value Date   WBC 4.7 02/21/2021   HGB 11.4 (L) 02/21/2021   HCT 36.2 02/21/2021   MCV 93.8 02/21/2021   PLT 212 02/21/2021    Lab Results  Component Value Date   CREATININE 0.52 02/21/2021   BUN 15 02/21/2021   NA 136 02/21/2021   K 3.8 02/21/2021   CL 101 02/21/2021   CO2 28 02/21/2021    Lab Results  Component Value Date   ALT 9 02/18/2021   AST 26 02/18/2021   ALKPHOS 89 02/18/2021   BILITOT 0.4 02/18/2021   INR 1.1 02/18/2021     Assessment: non-healing ulcer with possible osteomyelitis on xray.  It is unclear if the difficulty  with healing is related to her underlying MS or osteomyelitis.  The xray was reviewed and possible but not definitive.  I do not think though an MRI/CT will add much.  By her report though she is offloading best she can, her nutrition is reported to be good and not soiling it.  Therefore there is some potential to heal.   I will try empiric treatment with doxycycline and levaquin for 4 weeks and she should see some improvement in healing if it is truly osteomyelitits.  If no improvement, I suspect it likely won't continue to heal.    Plan: 1)  doxycycline + levaquin for 4 weeks 2) CMP, CBC, CRP ESR in 2 weeks  She has follow up  with Dr. Heber [place] mid July to recheck I can see her in August if indicated but if she doesn't improve, I don't feel further antibiotic treatment will help her, unless there are clinical signs of infection such as pus, cellulitis.

## 2021-08-03 NOTE — Telephone Encounter (Signed)
[place] and spoke with Rodney, PT. Communicated orders for labs from Dr. Linus Salmons: requests CBC, CMP, CRP, and ESR to be drawn in two weeks on 08/17/2021.  Eliezer Lofts took verbal order with readback and stated she would communicate orders to home health RN. [facility] fax numbers provided to receive results.  Binnie Kand, RN

## 2021-08-07 DIAGNOSIS — G8220 Paraplegia, unspecified: Secondary | ICD-10-CM | POA: Diagnosis not present

## 2021-08-07 DIAGNOSIS — N8184 Pelvic muscle wasting: Secondary | ICD-10-CM | POA: Diagnosis not present

## 2021-08-07 DIAGNOSIS — E440 Moderate protein-calorie malnutrition: Secondary | ICD-10-CM | POA: Diagnosis not present

## 2021-08-07 DIAGNOSIS — G35 Multiple sclerosis: Secondary | ICD-10-CM | POA: Diagnosis not present

## 2021-08-07 DIAGNOSIS — I700 Atherosclerosis of aorta: Secondary | ICD-10-CM | POA: Diagnosis not present

## 2021-08-07 DIAGNOSIS — M4628 Osteomyelitis of vertebra, sacral and sacrococcygeal region: Secondary | ICD-10-CM | POA: Diagnosis not present

## 2021-08-07 DIAGNOSIS — L89314 Pressure ulcer of right buttock, stage 4: Secondary | ICD-10-CM | POA: Diagnosis not present

## 2021-08-07 DIAGNOSIS — N179 Acute kidney failure, unspecified: Secondary | ICD-10-CM | POA: Diagnosis not present

## 2021-08-07 DIAGNOSIS — I10 Essential (primary) hypertension: Secondary | ICD-10-CM | POA: Diagnosis not present

## 2021-08-10 DIAGNOSIS — M4628 Osteomyelitis of vertebra, sacral and sacrococcygeal region: Secondary | ICD-10-CM | POA: Diagnosis not present

## 2021-08-10 DIAGNOSIS — N8184 Pelvic muscle wasting: Secondary | ICD-10-CM | POA: Diagnosis not present

## 2021-08-10 DIAGNOSIS — G35 Multiple sclerosis: Secondary | ICD-10-CM | POA: Diagnosis not present

## 2021-08-10 DIAGNOSIS — I10 Essential (primary) hypertension: Secondary | ICD-10-CM | POA: Diagnosis not present

## 2021-08-10 DIAGNOSIS — I700 Atherosclerosis of aorta: Secondary | ICD-10-CM | POA: Diagnosis not present

## 2021-08-10 DIAGNOSIS — E440 Moderate protein-calorie malnutrition: Secondary | ICD-10-CM | POA: Diagnosis not present

## 2021-08-10 DIAGNOSIS — L89314 Pressure ulcer of right buttock, stage 4: Secondary | ICD-10-CM | POA: Diagnosis not present

## 2021-08-10 DIAGNOSIS — N179 Acute kidney failure, unspecified: Secondary | ICD-10-CM | POA: Diagnosis not present

## 2021-08-10 DIAGNOSIS — G8220 Paraplegia, unspecified: Secondary | ICD-10-CM | POA: Diagnosis not present

## 2021-08-14 DIAGNOSIS — G35 Multiple sclerosis: Secondary | ICD-10-CM | POA: Diagnosis not present

## 2021-08-14 DIAGNOSIS — I700 Atherosclerosis of aorta: Secondary | ICD-10-CM | POA: Diagnosis not present

## 2021-08-14 DIAGNOSIS — G8220 Paraplegia, unspecified: Secondary | ICD-10-CM | POA: Diagnosis not present

## 2021-08-14 DIAGNOSIS — N8184 Pelvic muscle wasting: Secondary | ICD-10-CM | POA: Diagnosis not present

## 2021-08-14 DIAGNOSIS — E440 Moderate protein-calorie malnutrition: Secondary | ICD-10-CM | POA: Diagnosis not present

## 2021-08-14 DIAGNOSIS — I10 Essential (primary) hypertension: Secondary | ICD-10-CM | POA: Diagnosis not present

## 2021-08-14 DIAGNOSIS — L89314 Pressure ulcer of right buttock, stage 4: Secondary | ICD-10-CM | POA: Diagnosis not present

## 2021-08-14 DIAGNOSIS — N179 Acute kidney failure, unspecified: Secondary | ICD-10-CM | POA: Diagnosis not present

## 2021-08-14 DIAGNOSIS — M4628 Osteomyelitis of vertebra, sacral and sacrococcygeal region: Secondary | ICD-10-CM | POA: Diagnosis not present

## 2021-08-17 DIAGNOSIS — M4628 Osteomyelitis of vertebra, sacral and sacrococcygeal region: Secondary | ICD-10-CM | POA: Diagnosis not present

## 2021-08-17 DIAGNOSIS — I700 Atherosclerosis of aorta: Secondary | ICD-10-CM | POA: Diagnosis not present

## 2021-08-17 DIAGNOSIS — N8184 Pelvic muscle wasting: Secondary | ICD-10-CM | POA: Diagnosis not present

## 2021-08-17 DIAGNOSIS — I10 Essential (primary) hypertension: Secondary | ICD-10-CM | POA: Diagnosis not present

## 2021-08-17 DIAGNOSIS — N179 Acute kidney failure, unspecified: Secondary | ICD-10-CM | POA: Diagnosis not present

## 2021-08-17 DIAGNOSIS — L89314 Pressure ulcer of right buttock, stage 4: Secondary | ICD-10-CM | POA: Diagnosis not present

## 2021-08-17 DIAGNOSIS — G8220 Paraplegia, unspecified: Secondary | ICD-10-CM | POA: Diagnosis not present

## 2021-08-17 DIAGNOSIS — E440 Moderate protein-calorie malnutrition: Secondary | ICD-10-CM | POA: Diagnosis not present

## 2021-08-17 DIAGNOSIS — G35 Multiple sclerosis: Secondary | ICD-10-CM | POA: Diagnosis not present

## 2021-08-21 DIAGNOSIS — L89314 Pressure ulcer of right buttock, stage 4: Secondary | ICD-10-CM | POA: Diagnosis not present

## 2021-08-21 DIAGNOSIS — I10 Essential (primary) hypertension: Secondary | ICD-10-CM | POA: Diagnosis not present

## 2021-08-21 DIAGNOSIS — I700 Atherosclerosis of aorta: Secondary | ICD-10-CM | POA: Diagnosis not present

## 2021-08-21 DIAGNOSIS — G8220 Paraplegia, unspecified: Secondary | ICD-10-CM | POA: Diagnosis not present

## 2021-08-21 DIAGNOSIS — N179 Acute kidney failure, unspecified: Secondary | ICD-10-CM | POA: Diagnosis not present

## 2021-08-21 DIAGNOSIS — E440 Moderate protein-calorie malnutrition: Secondary | ICD-10-CM | POA: Diagnosis not present

## 2021-08-21 DIAGNOSIS — G35 Multiple sclerosis: Secondary | ICD-10-CM | POA: Diagnosis not present

## 2021-08-21 DIAGNOSIS — N8184 Pelvic muscle wasting: Secondary | ICD-10-CM | POA: Diagnosis not present

## 2021-08-21 DIAGNOSIS — M4628 Osteomyelitis of vertebra, sacral and sacrococcygeal region: Secondary | ICD-10-CM | POA: Diagnosis not present

## 2021-08-24 DIAGNOSIS — N179 Acute kidney failure, unspecified: Secondary | ICD-10-CM | POA: Diagnosis not present

## 2021-08-24 DIAGNOSIS — M4628 Osteomyelitis of vertebra, sacral and sacrococcygeal region: Secondary | ICD-10-CM | POA: Diagnosis not present

## 2021-08-24 DIAGNOSIS — I10 Essential (primary) hypertension: Secondary | ICD-10-CM | POA: Diagnosis not present

## 2021-08-24 DIAGNOSIS — L89314 Pressure ulcer of right buttock, stage 4: Secondary | ICD-10-CM | POA: Diagnosis not present

## 2021-08-24 DIAGNOSIS — I700 Atherosclerosis of aorta: Secondary | ICD-10-CM | POA: Diagnosis not present

## 2021-08-24 DIAGNOSIS — G35 Multiple sclerosis: Secondary | ICD-10-CM | POA: Diagnosis not present

## 2021-08-24 DIAGNOSIS — E440 Moderate protein-calorie malnutrition: Secondary | ICD-10-CM | POA: Diagnosis not present

## 2021-08-24 DIAGNOSIS — G8220 Paraplegia, unspecified: Secondary | ICD-10-CM | POA: Diagnosis not present

## 2021-08-24 DIAGNOSIS — N8184 Pelvic muscle wasting: Secondary | ICD-10-CM | POA: Diagnosis not present

## 2021-08-28 DIAGNOSIS — N8184 Pelvic muscle wasting: Secondary | ICD-10-CM | POA: Diagnosis not present

## 2021-08-28 DIAGNOSIS — L89314 Pressure ulcer of right buttock, stage 4: Secondary | ICD-10-CM | POA: Diagnosis not present

## 2021-08-28 DIAGNOSIS — I10 Essential (primary) hypertension: Secondary | ICD-10-CM | POA: Diagnosis not present

## 2021-08-28 DIAGNOSIS — E440 Moderate protein-calorie malnutrition: Secondary | ICD-10-CM | POA: Diagnosis not present

## 2021-08-28 DIAGNOSIS — I700 Atherosclerosis of aorta: Secondary | ICD-10-CM | POA: Diagnosis not present

## 2021-08-28 DIAGNOSIS — N179 Acute kidney failure, unspecified: Secondary | ICD-10-CM | POA: Diagnosis not present

## 2021-08-28 DIAGNOSIS — G8220 Paraplegia, unspecified: Secondary | ICD-10-CM | POA: Diagnosis not present

## 2021-08-28 DIAGNOSIS — G35 Multiple sclerosis: Secondary | ICD-10-CM | POA: Diagnosis not present

## 2021-08-28 DIAGNOSIS — M4628 Osteomyelitis of vertebra, sacral and sacrococcygeal region: Secondary | ICD-10-CM | POA: Diagnosis not present

## 2021-08-31 DIAGNOSIS — N179 Acute kidney failure, unspecified: Secondary | ICD-10-CM | POA: Diagnosis not present

## 2021-08-31 DIAGNOSIS — I10 Essential (primary) hypertension: Secondary | ICD-10-CM | POA: Diagnosis not present

## 2021-08-31 DIAGNOSIS — L89314 Pressure ulcer of right buttock, stage 4: Secondary | ICD-10-CM | POA: Diagnosis not present

## 2021-08-31 DIAGNOSIS — I700 Atherosclerosis of aorta: Secondary | ICD-10-CM | POA: Diagnosis not present

## 2021-08-31 DIAGNOSIS — E440 Moderate protein-calorie malnutrition: Secondary | ICD-10-CM | POA: Diagnosis not present

## 2021-08-31 DIAGNOSIS — G8220 Paraplegia, unspecified: Secondary | ICD-10-CM | POA: Diagnosis not present

## 2021-08-31 DIAGNOSIS — G35 Multiple sclerosis: Secondary | ICD-10-CM | POA: Diagnosis not present

## 2021-08-31 DIAGNOSIS — M4628 Osteomyelitis of vertebra, sacral and sacrococcygeal region: Secondary | ICD-10-CM | POA: Diagnosis not present

## 2021-08-31 DIAGNOSIS — N8184 Pelvic muscle wasting: Secondary | ICD-10-CM | POA: Diagnosis not present

## 2021-09-04 DIAGNOSIS — G8220 Paraplegia, unspecified: Secondary | ICD-10-CM | POA: Diagnosis not present

## 2021-09-04 DIAGNOSIS — E440 Moderate protein-calorie malnutrition: Secondary | ICD-10-CM | POA: Diagnosis not present

## 2021-09-04 DIAGNOSIS — M4628 Osteomyelitis of vertebra, sacral and sacrococcygeal region: Secondary | ICD-10-CM | POA: Diagnosis not present

## 2021-09-04 DIAGNOSIS — N8184 Pelvic muscle wasting: Secondary | ICD-10-CM | POA: Diagnosis not present

## 2021-09-04 DIAGNOSIS — N179 Acute kidney failure, unspecified: Secondary | ICD-10-CM | POA: Diagnosis not present

## 2021-09-04 DIAGNOSIS — I700 Atherosclerosis of aorta: Secondary | ICD-10-CM | POA: Diagnosis not present

## 2021-09-04 DIAGNOSIS — L89314 Pressure ulcer of right buttock, stage 4: Secondary | ICD-10-CM | POA: Diagnosis not present

## 2021-09-04 DIAGNOSIS — I10 Essential (primary) hypertension: Secondary | ICD-10-CM | POA: Diagnosis not present

## 2021-09-04 DIAGNOSIS — G35 Multiple sclerosis: Secondary | ICD-10-CM | POA: Diagnosis not present

## 2021-09-07 DIAGNOSIS — G35 Multiple sclerosis: Secondary | ICD-10-CM | POA: Diagnosis not present

## 2021-09-07 DIAGNOSIS — N8184 Pelvic muscle wasting: Secondary | ICD-10-CM | POA: Diagnosis not present

## 2021-09-07 DIAGNOSIS — E440 Moderate protein-calorie malnutrition: Secondary | ICD-10-CM | POA: Diagnosis not present

## 2021-09-07 DIAGNOSIS — L89314 Pressure ulcer of right buttock, stage 4: Secondary | ICD-10-CM | POA: Diagnosis not present

## 2021-09-07 DIAGNOSIS — N179 Acute kidney failure, unspecified: Secondary | ICD-10-CM | POA: Diagnosis not present

## 2021-09-07 DIAGNOSIS — G8220 Paraplegia, unspecified: Secondary | ICD-10-CM | POA: Diagnosis not present

## 2021-09-07 DIAGNOSIS — I700 Atherosclerosis of aorta: Secondary | ICD-10-CM | POA: Diagnosis not present

## 2021-09-07 DIAGNOSIS — M4628 Osteomyelitis of vertebra, sacral and sacrococcygeal region: Secondary | ICD-10-CM | POA: Diagnosis not present

## 2021-09-07 DIAGNOSIS — I10 Essential (primary) hypertension: Secondary | ICD-10-CM | POA: Diagnosis not present

## 2021-09-11 DIAGNOSIS — E440 Moderate protein-calorie malnutrition: Secondary | ICD-10-CM | POA: Diagnosis not present

## 2021-09-11 DIAGNOSIS — G8220 Paraplegia, unspecified: Secondary | ICD-10-CM | POA: Diagnosis not present

## 2021-09-11 DIAGNOSIS — N179 Acute kidney failure, unspecified: Secondary | ICD-10-CM | POA: Diagnosis not present

## 2021-09-11 DIAGNOSIS — I700 Atherosclerosis of aorta: Secondary | ICD-10-CM | POA: Diagnosis not present

## 2021-09-11 DIAGNOSIS — N8184 Pelvic muscle wasting: Secondary | ICD-10-CM | POA: Diagnosis not present

## 2021-09-11 DIAGNOSIS — M4628 Osteomyelitis of vertebra, sacral and sacrococcygeal region: Secondary | ICD-10-CM | POA: Diagnosis not present

## 2021-09-11 DIAGNOSIS — L89314 Pressure ulcer of right buttock, stage 4: Secondary | ICD-10-CM | POA: Diagnosis not present

## 2021-09-11 DIAGNOSIS — I10 Essential (primary) hypertension: Secondary | ICD-10-CM | POA: Diagnosis not present

## 2021-09-11 DIAGNOSIS — G35 Multiple sclerosis: Secondary | ICD-10-CM | POA: Diagnosis not present

## 2021-09-14 DIAGNOSIS — I700 Atherosclerosis of aorta: Secondary | ICD-10-CM | POA: Diagnosis not present

## 2021-09-14 DIAGNOSIS — M4628 Osteomyelitis of vertebra, sacral and sacrococcygeal region: Secondary | ICD-10-CM | POA: Diagnosis not present

## 2021-09-14 DIAGNOSIS — G8220 Paraplegia, unspecified: Secondary | ICD-10-CM | POA: Diagnosis not present

## 2021-09-14 DIAGNOSIS — I10 Essential (primary) hypertension: Secondary | ICD-10-CM | POA: Diagnosis not present

## 2021-09-14 DIAGNOSIS — G35 Multiple sclerosis: Secondary | ICD-10-CM | POA: Diagnosis not present

## 2021-09-14 DIAGNOSIS — E440 Moderate protein-calorie malnutrition: Secondary | ICD-10-CM | POA: Diagnosis not present

## 2021-09-14 DIAGNOSIS — N8184 Pelvic muscle wasting: Secondary | ICD-10-CM | POA: Diagnosis not present

## 2021-09-14 DIAGNOSIS — L89314 Pressure ulcer of right buttock, stage 4: Secondary | ICD-10-CM | POA: Diagnosis not present

## 2021-09-14 DIAGNOSIS — N179 Acute kidney failure, unspecified: Secondary | ICD-10-CM | POA: Diagnosis not present

## 2021-09-18 DIAGNOSIS — N8184 Pelvic muscle wasting: Secondary | ICD-10-CM | POA: Diagnosis not present

## 2021-09-18 DIAGNOSIS — N179 Acute kidney failure, unspecified: Secondary | ICD-10-CM | POA: Diagnosis not present

## 2021-09-18 DIAGNOSIS — G35 Multiple sclerosis: Secondary | ICD-10-CM | POA: Diagnosis not present

## 2021-09-18 DIAGNOSIS — L89314 Pressure ulcer of right buttock, stage 4: Secondary | ICD-10-CM | POA: Diagnosis not present

## 2021-09-18 DIAGNOSIS — G8220 Paraplegia, unspecified: Secondary | ICD-10-CM | POA: Diagnosis not present

## 2021-09-18 DIAGNOSIS — I700 Atherosclerosis of aorta: Secondary | ICD-10-CM | POA: Diagnosis not present

## 2021-09-18 DIAGNOSIS — I10 Essential (primary) hypertension: Secondary | ICD-10-CM | POA: Diagnosis not present

## 2021-09-18 DIAGNOSIS — M4628 Osteomyelitis of vertebra, sacral and sacrococcygeal region: Secondary | ICD-10-CM | POA: Diagnosis not present

## 2021-09-18 DIAGNOSIS — E440 Moderate protein-calorie malnutrition: Secondary | ICD-10-CM | POA: Diagnosis not present

## 2021-09-21 DIAGNOSIS — I10 Essential (primary) hypertension: Secondary | ICD-10-CM | POA: Diagnosis not present

## 2021-09-21 DIAGNOSIS — G8220 Paraplegia, unspecified: Secondary | ICD-10-CM | POA: Diagnosis not present

## 2021-09-21 DIAGNOSIS — I700 Atherosclerosis of aorta: Secondary | ICD-10-CM | POA: Diagnosis not present

## 2021-09-21 DIAGNOSIS — E440 Moderate protein-calorie malnutrition: Secondary | ICD-10-CM | POA: Diagnosis not present

## 2021-09-21 DIAGNOSIS — N179 Acute kidney failure, unspecified: Secondary | ICD-10-CM | POA: Diagnosis not present

## 2021-09-21 DIAGNOSIS — G35 Multiple sclerosis: Secondary | ICD-10-CM | POA: Diagnosis not present

## 2021-09-21 DIAGNOSIS — L89314 Pressure ulcer of right buttock, stage 4: Secondary | ICD-10-CM | POA: Diagnosis not present

## 2021-09-21 DIAGNOSIS — N8184 Pelvic muscle wasting: Secondary | ICD-10-CM | POA: Diagnosis not present

## 2021-09-21 DIAGNOSIS — M4628 Osteomyelitis of vertebra, sacral and sacrococcygeal region: Secondary | ICD-10-CM | POA: Diagnosis not present

## 2021-09-24 DIAGNOSIS — E440 Moderate protein-calorie malnutrition: Secondary | ICD-10-CM | POA: Diagnosis not present

## 2021-09-24 DIAGNOSIS — G35 Multiple sclerosis: Secondary | ICD-10-CM | POA: Diagnosis not present

## 2021-09-24 DIAGNOSIS — M4628 Osteomyelitis of vertebra, sacral and sacrococcygeal region: Secondary | ICD-10-CM | POA: Diagnosis not present

## 2021-09-24 DIAGNOSIS — I700 Atherosclerosis of aorta: Secondary | ICD-10-CM | POA: Diagnosis not present

## 2021-09-24 DIAGNOSIS — N8184 Pelvic muscle wasting: Secondary | ICD-10-CM | POA: Diagnosis not present

## 2021-09-24 DIAGNOSIS — N179 Acute kidney failure, unspecified: Secondary | ICD-10-CM | POA: Diagnosis not present

## 2021-09-24 DIAGNOSIS — I10 Essential (primary) hypertension: Secondary | ICD-10-CM | POA: Diagnosis not present

## 2021-09-24 DIAGNOSIS — L89314 Pressure ulcer of right buttock, stage 4: Secondary | ICD-10-CM | POA: Diagnosis not present

## 2021-09-24 DIAGNOSIS — G8220 Paraplegia, unspecified: Secondary | ICD-10-CM | POA: Diagnosis not present

## 2021-09-25 ENCOUNTER — Encounter (HOSPITAL_BASED_OUTPATIENT_CLINIC_OR_DEPARTMENT_OTHER): Payer: Medicare PPO | Attending: General Surgery | Admitting: Internal Medicine

## 2021-09-25 DIAGNOSIS — G35 Multiple sclerosis: Secondary | ICD-10-CM | POA: Insufficient documentation

## 2021-09-25 DIAGNOSIS — L89314 Pressure ulcer of right buttock, stage 4: Secondary | ICD-10-CM | POA: Insufficient documentation

## 2021-09-25 DIAGNOSIS — M4628 Osteomyelitis of vertebra, sacral and sacrococcygeal region: Secondary | ICD-10-CM | POA: Diagnosis not present

## 2021-09-25 NOTE — Progress Notes (Signed)
Bridgeforth, Leia L. ([id number]) Visit Report for 09/25/2021 Chief Complaint Document Details Patient Name: Date of Service: [name], Adreana L. 09/25/2021 8:45 A M Medical Record Number: [id number] Patient Account Number: [id number] Date of Birth/Sex: Treating RN: Nov 21, 1970 (51 y.o. Roel Cluck Primary Care Provider: Marletta Lor Other Clinician: Referring Provider: Treating Provider/Extender: Stasia Cavalier in Treatment: 26 Information Obtained from: Patient Chief Complaint 03/26/21; Right gluteus wound Electronic Signature(s) Signed: 09/25/2021 12:02:15 PM By: Geralyn Corwin DO Entered By: Geralyn Corwin on 09/25/2021 09:12:07 -------------------------------------------------------------------------------- HPI Details Patient Name: Date of Service: [name], Arelly L. 09/25/2021 8:45 A M Medical Record Number: [id number] Patient Account Number: [id number] Date of Birth/Sex: Treating RN: [birth year] (51 y.o. Roel Cluck Primary Care Provider: Marletta Lor Other Clinician: Referring Provider: Treating Provider/Extender: Stasia Cavalier in Treatment: 26 History of Present Illness HPI Description: Admission 03/26/2021 Ms. Dhanvi Boesen is a 51 year old female with a past medical history of multiple sclerosis for the past 16 years, complicated by severe debility and bedbound since 2015 that presents to the clinic for a 25-month history of ulcer to the right buttocks. She has been admitted to the hospital several times for this issue. She was last hospitalized on 02/18/2021 for septic shock due to the infected ulcer. She was evaluated by infectious disease at that time. She completed a 2- week course of antibiotics. There is no plan for follow-up. She is currently using calcium alginate dressings. She has home health that comes out twice weekly. She currently denies signs of infection. She has an air mattress for offloading. Of note She has a  history of osteomyelitis to the sacrum and treated in 2020 with IV antibiotics for 8 weeks. 3/21; patient presents for follow-up. She has been using calcium alginate to the wound bed and using Vashe to help clean prior to dressing changes. She has no issues or complaints today. 5/16; patient presents for follow-up. She has been using calcium alginate to the wound bed. She has no issues or complaints today. She is not able to aggressively offload the area due to her multiple sclerosis. 7/18; patient presents for follow-up. She had a televisit with Dr. Luciana Axe on 08/03/2021 for possible osteomyelitis noted on x-ray. She was started on doxycycline and Levaquin and completed her 4-week course. She has been using calcium alginate to the wound bed. She has no issues or complaints today. Electronic Signature(s) Signed: 09/25/2021 12:02:15 PM By: Geralyn Corwin DO Entered By: Geralyn Corwin on 09/25/2021 09:14:39 -------------------------------------------------------------------------------- Physical Exam Details Patient Name: Date of Service: [name], Amalia L. 09/25/2021 8:45 A M Medical Record Number: [id number] Patient Account Number: [id number] Date of Birth/Sex: Treating RN: 17-Apr-1970 (51 y.o. Roel Cluck Primary Care Provider: Marletta Lor Other Clinician: Referring Provider: Treating Provider/Extender: Stasia Cavalier in Treatment: 26 Constitutional respirations regular, non-labored and within target range for patient.Marland Kitchen Psychiatric pleasant and cooperative. Notes Right buttocks: Open wound with granulation tissue and undermining. This overlies very closely to bone. No bone exposed. Surrounding skin intact. No rashes. No signs of surrounding soft tissue infection. Electronic Signature(s) Signed: 09/25/2021 12:02:15 PM By: Geralyn Corwin DO Entered By: Geralyn Corwin on 09/25/2021  09:14:58 -------------------------------------------------------------------------------- Physician Orders Details Patient Name: Date of Service: [name], Kalisha L. 09/25/2021 8:45 A M Medical Record Number: [id number] Patient Account Number: [id number] Date of Birth/Sex: Treating RN: 03/20/1970 (51 y.o. Roel Cluck Primary Care Provider: Marletta Lor Other Clinician: Referring Provider: Treating Provider/Extender: Geralyn Corwin  Judee Clara in Treatment: 26 Verbal / Phone Orders: No Diagnosis Coding ICD-10 Coding Code Description L89.314 Pressure ulcer of right buttock, stage 4 G35 Multiple sclerosis M46.28 Osteomyelitis of vertebra, sacral and sacrococcygeal region Follow-up Appointments ppointment in: - 11/27/21 @ 8:45am with Dr. Heber [place] [facility], Room 7) Return A Bathing/ Shower/ Hygiene May shower with protection but do not get wound dressing(s) wet. Off-Loading Low air-loss mattress (Group 2) Turn and reposition every 2 hours Home Health No change in wound care orders this week; continue Home Health for wound care. May utilize formulary equivalent dressing for wound treatment orders unless otherwise specified. Other Home Health Orders/Instructions: - [facility] Wound Treatment Wound #1 - Gluteus Wound Laterality: Right Cleanser: Vashe, 8.5 (oz) (Home Health) Every Other Day/30 Days Discharge Instructions: Clean wound with Vashe or Anasept Peri-Wound Care: Skin Prep (Home Health) Every Other Day/30 Days Discharge Instructions: Use skin prep as directed Prim Dressing: Maxorb Extra Calcium Alginate Dressing, 4x4 in [facility]) Every Other Day/30 Days ary Discharge Instructions: Apply calcium alginate to wound bed as instructed Secondary Dressing: Zetuvit Plus Silicone Border Dressing 5x5 (in/in) (Home Health) Every Other Day/30 Days Discharge Instructions: Apply silicone border over primary dressing as directed. Electronic Signature(s) Signed: 09/25/2021 12:02:15  PM By: Kalman Shan DO Entered By: Kalman Shan on 09/25/2021 09:15:20 -------------------------------------------------------------------------------- Problem List Details Patient Name: Date of Service: [name], Tamieka L. 09/25/2021 8:45 A M Medical Record Number: [id number] Patient Account Number: [id number] Date of Birth/Sex: Treating RN: 1971/01/30 (51 y.o. [name] Primary Care Provider: Alvester Chou Other Clinician: Referring Provider: Treating Provider/Extender: Haydee Salter in Treatment: 26 Active Problems ICD-10 Encounter Code Description Active Date MDM Diagnosis L89.314 Pressure ulcer of right buttock, stage 4 03/26/2021 No Yes G35 Multiple sclerosis 03/26/2021 No Yes M46.28 Osteomyelitis of vertebra, sacral and sacrococcygeal region 03/26/2021 No Yes Inactive Problems Resolved Problems Electronic Signature(s) Signed: 09/25/2021 12:02:15 PM By: Kalman Shan DO Previous Signature: 09/25/2021 8:54:51 AM Version By: Lorrin Jackson Entered By: Kalman Shan on 09/25/2021 09:04:13 -------------------------------------------------------------------------------- Progress Note Details Patient Name: Date of Service: [name], Shadawn L. 09/25/2021 8:45 A M Medical Record Number: [id number] Patient Account Number: [id number] Date of Birth/Sex: Treating RN: 09/11/1970 (51 y.o. [name] Primary Care Provider: Alvester Chou Other Clinician: Referring Provider: Treating Provider/Extender: Haydee Salter in Treatment: 26 Subjective Chief Complaint Information obtained from Patient 03/26/21; Right gluteus wound History of Present Illness (HPI) Admission 03/26/2021 Ms. Eutha Corrado is a 51 year old female with a past medical history of multiple sclerosis for the past 16 years, complicated by severe debility and bedbound since 2015 that presents to the clinic for a 97-month history of ulcer to the right buttocks.  She has been admitted to the hospital several times for this issue. She was last hospitalized on 02/18/2021 for septic shock due to the infected ulcer. She was evaluated by infectious disease at that time. She completed a 2- week course of antibiotics. There is no plan for follow-up. She is currently using calcium alginate dressings. She has home health that comes out twice weekly. She currently denies signs of infection. She has an air mattress for offloading. Of note She has a history of osteomyelitis to the sacrum and treated in 2020 with IV antibiotics for 8 weeks. 3/21; patient presents for follow-up. She has been using calcium alginate to the wound bed and using Vashe to help clean prior to dressing changes. She has no issues or complaints today. 5/16;  patient presents for follow-up. She has been using calcium alginate to the wound bed. She has no issues or complaints today. She is not able to aggressively offload the area due to her multiple sclerosis. 7/18; patient presents for follow-up. She had a televisit with Dr. Luciana Axe on 08/03/2021 for possible osteomyelitis noted on x-ray. She was started on doxycycline and Levaquin and completed her 4-week course. She has been using calcium alginate to the wound bed. She has no issues or complaints today. Patient History Information obtained from Patient. Family History Unknown History. Social History Former smoker, Marital Status - Single, Alcohol Use - Never, Drug Use - No History, Caffeine Use - Rarely. Medical History Cardiovascular Patient has history of Hypertension Denies history of Deep Vein Thrombosis Integumentary (Skin) Denies history of History of Burn Musculoskeletal Patient has history of Osteomyelitis - right ischial tuberosity Neurologic Patient has history of Paraplegia Medical A Surgical History Notes nd Neurologic Multiple Sclerosis Objective Constitutional respirations regular, non-labored and within target range for  patient.. Vitals Time Taken: 8:31 AM, Height: 62 in, Weight: 145 lbs, BMI: 26.5, Temperature: 98.4 F, Pulse: 87 bpm, Respiratory Rate: 18 breaths/min, Blood Pressure: 120/83 mmHg. Psychiatric pleasant and cooperative. General Notes: Right buttocks: Open wound with granulation tissue and undermining. This overlies very closely to bone. No bone exposed. Surrounding skin intact. No rashes. No signs of surrounding soft tissue infection. Integumentary (Hair, Skin) Wound #1 status is Open. Original cause of wound was Pressure Injury. The date acquired was: 12/09/2020. The wound has been in treatment 26 weeks. The wound is located on the Right Gluteus. The wound measures 3.5cm length x 5.5cm width x 3.5cm depth; 15.119cm^2 area and 52.916cm^3 volume. There is Fat Layer (Subcutaneous Tissue) exposed. Tunneling has been noted at 10:00 with a maximum distance of 3.5cm. Undermining begins at 6:00 and ends at 5:00 with a maximum distance of 1cm. There is a medium amount of serosanguineous drainage noted. The wound margin is well defined and not attached to the wound base. There is large (67-100%) red, pink, friable granulation within the wound bed. There is no necrotic tissue within the wound bed. Assessment Active Problems ICD-10 Pressure ulcer of right buttock, stage 4 Multiple sclerosis Osteomyelitis of vertebra, sacral and sacrococcygeal region Patient's wound has shown improvement in size and appearance since last clinic visit. She completed her course of antibiotics for possible osteomyelitis and this appears to have helped with her wound healing. There is no further need to continue antibiotics at this time. I recommended aggressive offloading and continuing calcium alginate. I recommended cleaning the wound with Vashe. Unfortunately this will be a very difficult wound to heal due to being bedbound/wheelchair bound due to her MS. I recommended she go to the ED if she develops signs of infection. I  will follow her every 2 months. She has home health that helps with wound dressings. Plan Follow-up Appointments: Return Appointment in: - 11/27/21 @ 8:45am with Dr. Mikey Bussing [place] [facility], Room 7) Bathing/ Shower/ Hygiene: May shower with protection but do not get wound dressing(s) wet. Off-Loading: Low air-loss mattress (Group 2) Turn and reposition every 2 hours Home Health: No change in wound care orders this week; continue Home Health for wound care. May utilize formulary equivalent dressing for wound treatment orders unless otherwise specified. Other Home Health Orders/Instructions: - [facility] WOUND #1: - Gluteus Wound Laterality: Right Cleanser: Vashe, 8.5 (oz) (Home Health) Every Other Day/30 Days Discharge Instructions: Clean wound with Vashe or Anasept Peri-Wound Care: Skin Prep (Home Health)  Every Other Day/30 Days Discharge Instructions: Use skin prep as directed Prim Dressing: Maxorb Extra Calcium Alginate Dressing, 4x4 in [facility]) Every Other Day/30 Days ary Discharge Instructions: Apply calcium alginate to wound bed as instructed Secondary Dressing: Zetuvit Plus Silicone Border Dressing 5x5 (in/in) ([place]) Every Other Day/30 Days Discharge Instructions: Apply silicone border over primary dressing as directed. 1. Aggressive offloading 2. Vashe 3. Calcium alginate 4. Follow-up in 2 months Electronic Signature(s) Signed: 09/25/2021 12:02:15 PM By: Kalman Shan DO Entered By: Kalman Shan on 09/25/2021 09:19:08 -------------------------------------------------------------------------------- HxROS Details Patient Name: Date of Service: [name], Treina L. 09/25/2021 8:45 A M Medical Record Number: [id number] Patient Account Number: [id number] Date of Birth/Sex: Treating RN: 1971/03/03 (51 y.o. [name] Primary Care Provider: Alvester Chou Other Clinician: Referring Provider: Treating Provider/Extender: Haydee Salter in Treatment:  26 Information Obtained From Patient Cardiovascular Medical History: Positive for: Hypertension Negative for: Deep Vein Thrombosis Integumentary (Skin) Medical History: Negative for: History of Burn Musculoskeletal Medical History: Positive for: Osteomyelitis - right ischial tuberosity Neurologic Medical History: Positive for: Paraplegia Past Medical History Notes: Multiple Sclerosis Immunizations Pneumococcal Vaccine: Received Pneumococcal Vaccination: No Implantable Devices Yes Family and Social History Unknown History: Yes; Former smoker; Marital Status - Single; Alcohol Use: Never; Drug Use: No History; Caffeine Use: Rarely; Financial Concerns: No; Food, Clothing or Shelter Needs: No; Support System Lacking: No; Transportation Concerns: No Electronic Signature(s) Signed: 09/25/2021 12:02:15 PM By: Kalman Shan DO Signed: 09/25/2021 4:22:32 PM By: Lorrin Jackson Entered By: Kalman Shan on 09/25/2021 09:14:44 -------------------------------------------------------------------------------- SuperBill Details Patient Name: Date of Service: [name], Dashawn L. 09/25/2021 Medical Record Number: [id number] Patient Account Number: [id number] Date of Birth/Sex: Treating RN: 06-Nov-1970 (51 y.o. [name] Primary Care Provider: Alvester Chou Other Clinician: Referring Provider: Treating Provider/Extender: Haydee Salter in Treatment: 26 Diagnosis Coding ICD-10 Codes Code Description L89.314 Pressure ulcer of right buttock, stage 4 G35 Multiple sclerosis M46.28 Osteomyelitis of vertebra, sacral and sacrococcygeal region Facility Procedures CPT4 Code: [id number] Description: [facility] VISIT-LEV 3 EST PT Modifier: Quantity: 1 Physician Procedures Electronic Signature(s) Signed: 09/25/2021 12:02:15 PM By: Kalman Shan DO Previous Signature: 09/25/2021 9:17:57 AM Version By: Lorrin Jackson Entered By: Kalman Shan on 09/25/2021  09:19:23

## 2021-09-26 NOTE — Progress Notes (Signed)
[name], [name] ([id number]) Visit Report for 09/25/2021 Arrival Information Details Patient Name: Date of Service: [name], [name] 09/25/2021 8:45 A M Medical Record Number: [id number] Patient Account Number: [id number] Date of Birth/Sex: Treating RN: 1970/07/03 (51 y.o. [name] Primary Care [name] Other Clinician: Referring [name]: Treating [name]/Extender: [name] in Treatment: 26 Visit Information History Since Last Visit Added or deleted any medications: No Patient Arrived: Stretcher Any new allergies or adverse reactions: No Arrival Time: 08:30 Had a fall or experienced change in No Accompanied By: self activities of daily living that may affect Transfer Assistance: Manual risk of falls: Patient Identification Verified: Yes Signs or symptoms of abuse/neglect since last visito No Secondary Verification Process Completed: Yes Hospitalized since last visit: No Patient Requires Transmission-Based Precautions: No Implantable device outside of the clinic excluding No Patient Has Alerts: No cellular tissue based products placed in the center since last visit: Has Dressing in Place as Prescribed: Yes Pain Present Now: No Electronic Signature(s) Signed: 09/25/2021 4:17:28 PM By: [name] Entered By: [name] on 09/25/2021 08:31:12 -------------------------------------------------------------------------------- Clinic Level of Care Assessment Details Patient Name: Date of Service: [name], [name] 09/25/2021 8:45 A M Medical Record Number: [id number] Patient Account Number: [id number] Date of Birth/Sex: Treating RN: [birth year] (51 y.o. [name] Primary Care Aariv Medlock: [name] Other Clinician: Referring [name]: Treating [name]/Extender: [name] in Treatment: 26 Clinic Level of Care Assessment Items TOOL 4 Quantity Score X- 1 0 Use when only an EandM is performed  on FOLLOW-UP visit ASSESSMENTS - Nursing Assessment / Reassessment X- 1 10 Reassessment of Co-morbidities (includes updates in patient status) X- 1 5 Reassessment of Adherence to Treatment Plan ASSESSMENTS - Wound and Skin A ssessment / Reassessment X - Simple Wound Assessment / Reassessment - one wound 1 5 []  - 0 Complex Wound Assessment / Reassessment - multiple wounds []  - 0 Dermatologic / Skin Assessment (not related to wound area) ASSESSMENTS - Focused Assessment []  - 0 Circumferential Edema Measurements - multi extremities []  - 0 Nutritional Assessment / Counseling / Intervention []  - 0 Lower Extremity Assessment (monofilament, tuning fork, pulses) []  - 0 Peripheral Arterial Disease Assessment (using hand held doppler) ASSESSMENTS - Ostomy and/or Continence Assessment and Care []  - 0 Incontinence Assessment and Management []  - 0 Ostomy Care Assessment and Management (repouching, etc.) PROCESS - Coordination of Care []  - 0 Simple Patient / Family Education for ongoing care X- 1 20 Complex (extensive) Patient / Family Education for ongoing care []  - 0 Staff obtains Programmer, systems, Records, T Results / Process Orders est X- 1 10 Staff telephones HHA, Nursing Homes / Clarify orders / etc []  - 0 Routine Transfer to another Facility (non-emergent condition) []  - 0 Routine Hospital Admission (non-emergent condition) []  - 0 New Admissions / Biomedical engineer / Ordering NPWT Apligraf, etc. , []  - 0 Emergency Hospital Admission (emergent condition) []  - 0 Simple Discharge Coordination []  - 0 Complex (extensive) Discharge Coordination PROCESS - Special Needs []  - 0 Pediatric / Minor Patient Management []  - 0 Isolation Patient Management []  - 0 Hearing / Language / Visual special needs []  - 0 Assessment of Community assistance (transportation, D/C planning, etc.) []  - 0 Additional assistance / Altered mentation []  - 0 Support Surface(s) Assessment (bed,  cushion, seat, etc.) INTERVENTIONS - Wound Cleansing / Measurement X - Simple Wound Cleansing - one wound 1 5 []  - 0 Complex Wound Cleansing - multiple  wounds X- 1 5 Wound Imaging (photographs - any number of wounds) []  - 0 Wound Tracing (instead of photographs) X- 1 5 Simple Wound Measurement - one wound []  - 0 Complex Wound Measurement - multiple wounds INTERVENTIONS - Wound Dressings []  - 0 Small Wound Dressing one or multiple wounds X- 1 15 Medium Wound Dressing one or multiple wounds []  - 0 Large Wound Dressing one or multiple wounds []  - 0 Application of Medications - topical []  - 0 Application of Medications - injection INTERVENTIONS - Miscellaneous []  - 0 External ear exam []  - 0 Specimen Collection (cultures, biopsies, blood, body fluids, etc.) []  - 0 Specimen(s) / Culture(s) sent or taken to Lab for analysis []  - 0 Patient Transfer (multiple staff / Civil Service fast streamer / Similar devices) []  - 0 Simple Staple / Suture removal (25 or less) []  - 0 Complex Staple / Suture removal (26 or more) []  - 0 Hypo / Hyperglycemic Management (close monitor of Blood Glucose) []  - 0 Ankle / Brachial Index (ABI) - do not check if billed separately X- 1 5 Vital Signs Has the patient been seen at the hospital within the last three years: Yes Total Score: 85 Level Of Care: New/Established - Level 3 Electronic Signature(s) Signed: 09/25/2021 4:22:32 PM By: [name] Entered By: [name] on 09/25/2021 09:17:49 -------------------------------------------------------------------------------- Encounter Discharge Information Details Patient Name: Date of Service: [name], [name] 09/25/2021 8:45 A M Medical Record Number: [id number] Patient Account Number: [id number] Date of Birth/Sex: Treating RN: 1970-05-11 (51 y.o. [name] Primary Care Lynnelle Mesmer: [name] Other Clinician: Referring Dreshaun Stene: Treating Dontavion Noxon/Extender: [name] in  Treatment: 26 Encounter Discharge Information Items Discharge Condition: Stable Ambulatory Status: Stretcher Discharge Destination: Home Transportation: Ambulance Schedule Follow-up Appointment: Yes Clinical Summary of Care: Provided on 09/25/2021 Form Type Recipient Paper Patient Patient Electronic Signature(s) Signed: 09/25/2021 9:18:37 AM By: [name] Entered By: [name] on 09/25/2021 09:18:37 -------------------------------------------------------------------------------- Lower Extremity Assessment Details Patient Name: Date of Service: [name], [name] 09/25/2021 8:45 A M Medical Record Number: [id number] Patient Account Number: [id number] Date of Birth/Sex: Treating RN: 07-04-1970 (51 y.o. [name] Primary Care Amal Saiki: [name] Other Clinician: Referring Ashaz Robling: Treating Ruble Pumphrey/Extender: [name] in Treatment: 26 Electronic Signature(s) Signed: 09/25/2021 4:17:28 PM By: [name] Signed: 09/25/2021 4:22:32 PM By: [name] Entered By: [name] on 09/25/2021 08:31:53 -------------------------------------------------------------------------------- Multi Wound Chart Details Patient Name: Date of Service: [name], [name] 09/25/2021 8:45 A M Medical Record Number: [id number] Patient Account Number: [id number] Date of Birth/Sex: Treating RN: Jan 25, 1971 (51 y.o. [name] Primary Care Charlise Giovanetti: [name] Other Clinician: Referring Malea Swilling: Treating Alonna Bartling/Extender: [name] in Treatment: 26 Vital Signs Height(in): 15 Pulse(bpm): 41 Weight(lbs): 145 Blood Pressure(mmHg): 120/83 Body Mass Index(BMI): 26.5 Temperature(F): 98.4 Respiratory Rate(breaths/min): 18 Photos: [N/A:N/A] Right Gluteus N/A N/A Wound Location: Pressure Injury N/A N/A Wounding Event: Pressure Ulcer N/A N/A Primary Etiology: Hypertension, Osteomyelitis, N/A N/A Comorbid  History: Paraplegia 12/09/2020 N/A N/A Date Acquired: 65 N/A N/A Weeks of Treatment: Open N/A N/A Wound Status: No N/A N/A Wound Recurrence: 3.5x5.5x3.5 N/A N/A Measurements L x W x D (cm) 15.119 N/A N/A A (cm) : rea 52.916 N/A N/A Volume (cm) : 14.40% N/A N/A % Reduction in A rea: 37.60% N/A N/A % Reduction in Volume: 10 Position 1 (o'clock): 3.5 Maximum Distance 1 (cm): 6 Starting Position 1 (o'clock): 5 Ending Position 1 (o'clock): 1 Maximum Distance  1 (cm): Yes N/A N/A Tunneling: Yes N/A N/A Undermining: Category/Stage IV N/A N/A Classification: Medium N/A N/A Exudate A mount: Serosanguineous N/A N/A Exudate Type: red, brown N/A N/A Exudate Color: Well defined, not attached N/A N/A Wound Margin: Large (67-100%) N/A N/A Granulation A mount: Red, Pink, Friable N/A N/A Granulation Quality: None Present (0%) N/A N/A Necrotic A mount: Fat Layer (Subcutaneous Tissue): Yes N/A N/A Exposed Structures: Fascia: No Tendon: No Muscle: No Joint: No Bone: No None N/A N/A Epithelialization: Treatment Notes Electronic Signature(s) Signed: 09/25/2021 12:02:15 PM By: Kalman Shan DO Signed: 09/25/2021 4:22:32 PM By: [name] Entered By: Kalman Shan on 09/25/2021 09:04:17 -------------------------------------------------------------------------------- Multi-Disciplinary Care Plan Details Patient Name: Date of Service: [name], [name] 09/25/2021 8:45 A M Medical Record Number: [id number] Patient Account Number: [id number] Date of Birth/Sex: Treating RN: Nov 21, 1970 (51 y.o. [name] Primary Care Ernesto Lashway: [name] Other Clinician: Referring Aino Heckert: Treating Zayan Delvecchio/Extender: [name] in Treatment: Vickery reviewed with physician Active Inactive Nutrition Nursing Diagnoses: Potential for alteratiion in Nutrition/Potential for imbalanced nutrition Goals: Patient/caregiver  agrees to and verbalizes understanding of need to use nutritional supplements and/or vitamins as prescribed Date Initiated: 03/26/2021 Target Resolution Date: 11/27/2021 Goal Status: Active Interventions: Assess patient nutrition upon admission and as needed per policy Provide education on nutrition Treatment Activities: Education provided on Nutrition : 03/26/2021 Notes: Pressure Nursing Diagnoses: Knowledge deficit related to causes and risk factors for pressure ulcer development Knowledge deficit related to management of pressures ulcers Potential for impaired tissue integrity related to pressure, friction, moisture, and shear Goals: Patient/caregiver will verbalize risk factors for pressure ulcer development Date Initiated: 03/26/2021 Target Resolution Date: 11/27/2021 Goal Status: Active Patient/caregiver will verbalize understanding of pressure ulcer management Date Initiated: 03/26/2021 Target Resolution Date: 11/27/2021 Goal Status: Active Interventions: Assess: immobility, friction, shearing, incontinence upon admission and as needed Assess offloading mechanisms upon admission and as needed Assess potential for pressure ulcer upon admission and as needed Provide education on pressure ulcers Notes: 09/25/21: Uses air mattress, pressure relief ongoing. Wound/Skin Impairment Nursing Diagnoses: Impaired tissue integrity Knowledge deficit related to ulceration/compromised skin integrity Goals: Patient/caregiver will verbalize understanding of skin care regimen Date Initiated: 03/26/2021 Target Resolution Date: 11/27/2021 Goal Status: Active Ulcer/skin breakdown will have a volume reduction of 30% by week 4 Date Initiated: 03/26/2021 Date Inactivated: 07/24/2021 Target Resolution Date: 04/27/2021 Goal Status: Met Interventions: Assess patient/caregiver ability to obtain necessary supplies Assess patient/caregiver ability to perform ulcer/skin care regimen upon admission and as  needed Assess ulceration(s) every visit Provide education on ulcer and skin care Notes: 07/24/21: Pressure relief ongoing. Patient has low loss air mattress. 09/25/21: Wound care regimen continues, has HH assisting with dressing changes. Electronic Signature(s) Signed: 09/25/2021 8:56:26 AM By: [name] Entered By: [name] on 09/25/2021 08:56:26 -------------------------------------------------------------------------------- Pain Assessment Details Patient Name: Date of Service: [name], [name] 09/25/2021 8:45 A M Medical Record Number: [id number] Patient Account Number: [id number] Date of Birth/Sex: Treating RN: Jul 31, 1970 (51 y.o. [name] Primary Care Kery Haltiwanger: [name] Other Clinician: Referring Kishaun Erekson: Treating Luqman Perrelli/Extender: [name] in Treatment: 26 Active Problems Location of Pain Severity and Description of Pain Patient Has Paino No Site Locations Pain Management and Medication Current Pain Management: Electronic Signature(s) Signed: 09/25/2021 4:17:28 PM By: [name] Signed: 09/25/2021 4:22:32 PM By: [name] Entered By: [name] on 09/25/2021 08:31:44 -------------------------------------------------------------------------------- Patient/Caregiver Education Details Patient Name: Date of Service: [name], [name] 7/18/2023andnbsp8:45  A M Medical Record Number: [id number] Patient Account Number: [id number] Date of Birth/Gender: Treating RN: May 19, 1970 (51 y.o. [name] Primary Care Physician: [name] Other Clinician: Referring Physician: Treating Physician/Extender: [name] in Treatment: 26 Education Assessment Education Provided To: Patient and Caregiver Education Topics Provided Nutrition: Methods: Explain/Verbal, Printed Responses: State content correctly Pressure: Methods: Explain/Verbal, Printed Responses: State content  correctly Wound/Skin Impairment: Methods: Explain/Verbal, Printed Responses: State content correctly Electronic Signature(s) Signed: 09/25/2021 4:22:32 PM By: [name] Entered By: [name] on 09/25/2021 08:56:51 -------------------------------------------------------------------------------- Wound Assessment Details Patient Name: Date of Service: [name], [name] 09/25/2021 8:45 A M Medical Record Number: [id number] Patient Account Number: [id number] Date of Birth/Sex: Treating RN: [birth year] (51 y.o. [name] Primary Care Aalia Greulich: [name] Other Clinician: Referring Camreigh Michie: Treating Evian Salguero/Extender: [name] in Treatment: 26 Wound Status Wound Number: 1 Primary Etiology: Pressure Ulcer Wound Location: Right Gluteus Wound Status: Open Wounding Event: Pressure Injury Comorbid History: Hypertension, Osteomyelitis, Paraplegia Date Acquired: 12/09/2020 Weeks Of Treatment: 26 Clustered Wound: No Photos Wound Measurements Length: (cm) 3.5 Width: (cm) 5.5 Depth: (cm) 3.5 Area: (cm) 15.119 Volume: (cm) 52.916 % Reduction in Area: 14.4% % Reduction in Volume: 37.6% Epithelialization: None Tunneling: Yes Position (o'clock): 10 Maximum Distance: (cm) 3.5 Undermining: Yes Starting Position (o'clock): 6 Ending Position (o'clock): 5 Maximum Distance: (cm) 1 Wound Description Classification: Category/Stage IV Wound Margin: Well defined, not attached Exudate Amount: Medium Exudate Type: Serosanguineous Exudate Color: red, brown Foul Odor After Cleansing: No Slough/Fibrino No Wound Bed Granulation Amount: Large (67-100%) Exposed Structure Granulation Quality: Red, Pink, Friable Fascia Exposed: No Necrotic Amount: None Present (0%) Fat Layer (Subcutaneous Tissue) Exposed: Yes Tendon Exposed: No Muscle Exposed: No Joint Exposed: No Bone Exposed: No Treatment Notes Wound #1 (Gluteus) Wound Laterality:  Right Cleanser Vashe, 8.5 (oz) Discharge Instruction: Clean wound with Vashe or Anasept Peri-Wound Care Skin Prep Discharge Instruction: Use skin prep as directed Topical Primary Dressing Maxorb Extra Calcium Alginate Dressing, 4x4 in Discharge Instruction: Apply calcium alginate to wound bed as instructed Secondary Dressing Zetuvit Plus Silicone Border Dressing 5x5 (in/in) Discharge Instruction: Apply silicone border over primary dressing as directed. Secured With Compression Wrap Compression Stockings Environmental education officer) Signed: 09/25/2021 4:22:32 PM By: [name] Signed: 09/26/2021 4:20:13 PM By: Deon Pilling RN, BSN Entered By: Deon Pilling on 09/25/2021 08:41:45 -------------------------------------------------------------------------------- Vitals Details Patient Name: Date of Service: [name], [name] 09/25/2021 8:45 A M Medical Record Number: [id number] Patient Account Number: [id number] Date of Birth/Sex: Treating RN: 03-13-1970 (51 y.o. [name] Primary Care Rhiannan Kievit: [name] Other Clinician: Referring Selyna Klahn: Treating Brynley Cuddeback/Extender: [name] in Treatment: 26 Vital Signs Time Taken: 08:31 Temperature (F): 98.4 Height (in): 62 Pulse (bpm): 87 Weight (lbs): 145 Respiratory Rate (breaths/min): 18 Body Mass Index (BMI): 26.5 Blood Pressure (mmHg): 120/83 Reference Range: 80 - 120 mg / dl Electronic Signature(s) Signed: 09/25/2021 4:17:28 PM By: [name] Entered By: [name] on 09/25/2021 08:31:35

## 2021-09-28 DIAGNOSIS — I10 Essential (primary) hypertension: Secondary | ICD-10-CM | POA: Diagnosis not present

## 2021-09-28 DIAGNOSIS — G8220 Paraplegia, unspecified: Secondary | ICD-10-CM | POA: Diagnosis not present

## 2021-09-28 DIAGNOSIS — L89314 Pressure ulcer of right buttock, stage 4: Secondary | ICD-10-CM | POA: Diagnosis not present

## 2021-09-28 DIAGNOSIS — E440 Moderate protein-calorie malnutrition: Secondary | ICD-10-CM | POA: Diagnosis not present

## 2021-09-28 DIAGNOSIS — G8929 Other chronic pain: Secondary | ICD-10-CM | POA: Diagnosis not present

## 2021-09-28 DIAGNOSIS — N200 Calculus of kidney: Secondary | ICD-10-CM | POA: Diagnosis not present

## 2021-09-28 DIAGNOSIS — G35 Multiple sclerosis: Secondary | ICD-10-CM | POA: Diagnosis not present

## 2021-09-28 DIAGNOSIS — I700 Atherosclerosis of aorta: Secondary | ICD-10-CM | POA: Diagnosis not present

## 2021-09-28 DIAGNOSIS — N8184 Pelvic muscle wasting: Secondary | ICD-10-CM | POA: Diagnosis not present

## 2021-10-01 DIAGNOSIS — N8184 Pelvic muscle wasting: Secondary | ICD-10-CM | POA: Diagnosis not present

## 2021-10-01 DIAGNOSIS — G35 Multiple sclerosis: Secondary | ICD-10-CM | POA: Diagnosis not present

## 2021-10-01 DIAGNOSIS — G8220 Paraplegia, unspecified: Secondary | ICD-10-CM | POA: Diagnosis not present

## 2021-10-01 DIAGNOSIS — I10 Essential (primary) hypertension: Secondary | ICD-10-CM | POA: Diagnosis not present

## 2021-10-01 DIAGNOSIS — G8929 Other chronic pain: Secondary | ICD-10-CM | POA: Diagnosis not present

## 2021-10-01 DIAGNOSIS — L89314 Pressure ulcer of right buttock, stage 4: Secondary | ICD-10-CM | POA: Diagnosis not present

## 2021-10-01 DIAGNOSIS — I700 Atherosclerosis of aorta: Secondary | ICD-10-CM | POA: Diagnosis not present

## 2021-10-01 DIAGNOSIS — E440 Moderate protein-calorie malnutrition: Secondary | ICD-10-CM | POA: Diagnosis not present

## 2021-10-01 DIAGNOSIS — N200 Calculus of kidney: Secondary | ICD-10-CM | POA: Diagnosis not present

## 2021-10-05 DIAGNOSIS — E440 Moderate protein-calorie malnutrition: Secondary | ICD-10-CM | POA: Diagnosis not present

## 2021-10-05 DIAGNOSIS — I700 Atherosclerosis of aorta: Secondary | ICD-10-CM | POA: Diagnosis not present

## 2021-10-05 DIAGNOSIS — L89314 Pressure ulcer of right buttock, stage 4: Secondary | ICD-10-CM | POA: Diagnosis not present

## 2021-10-05 DIAGNOSIS — I10 Essential (primary) hypertension: Secondary | ICD-10-CM | POA: Diagnosis not present

## 2021-10-05 DIAGNOSIS — G35 Multiple sclerosis: Secondary | ICD-10-CM | POA: Diagnosis not present

## 2021-10-05 DIAGNOSIS — G8220 Paraplegia, unspecified: Secondary | ICD-10-CM | POA: Diagnosis not present

## 2021-10-05 DIAGNOSIS — N200 Calculus of kidney: Secondary | ICD-10-CM | POA: Diagnosis not present

## 2021-10-05 DIAGNOSIS — N8184 Pelvic muscle wasting: Secondary | ICD-10-CM | POA: Diagnosis not present

## 2021-10-05 DIAGNOSIS — G8929 Other chronic pain: Secondary | ICD-10-CM | POA: Diagnosis not present

## 2021-10-09 DIAGNOSIS — N200 Calculus of kidney: Secondary | ICD-10-CM | POA: Diagnosis not present

## 2021-10-09 DIAGNOSIS — I700 Atherosclerosis of aorta: Secondary | ICD-10-CM | POA: Diagnosis not present

## 2021-10-09 DIAGNOSIS — G8220 Paraplegia, unspecified: Secondary | ICD-10-CM | POA: Diagnosis not present

## 2021-10-09 DIAGNOSIS — G35 Multiple sclerosis: Secondary | ICD-10-CM | POA: Diagnosis not present

## 2021-10-09 DIAGNOSIS — L89314 Pressure ulcer of right buttock, stage 4: Secondary | ICD-10-CM | POA: Diagnosis not present

## 2021-10-09 DIAGNOSIS — I10 Essential (primary) hypertension: Secondary | ICD-10-CM | POA: Diagnosis not present

## 2021-10-09 DIAGNOSIS — E440 Moderate protein-calorie malnutrition: Secondary | ICD-10-CM | POA: Diagnosis not present

## 2021-10-09 DIAGNOSIS — N8184 Pelvic muscle wasting: Secondary | ICD-10-CM | POA: Diagnosis not present

## 2021-10-09 DIAGNOSIS — G8929 Other chronic pain: Secondary | ICD-10-CM | POA: Diagnosis not present

## 2021-10-11 DIAGNOSIS — I700 Atherosclerosis of aorta: Secondary | ICD-10-CM | POA: Diagnosis not present

## 2021-10-11 DIAGNOSIS — N200 Calculus of kidney: Secondary | ICD-10-CM | POA: Diagnosis not present

## 2021-10-11 DIAGNOSIS — L89314 Pressure ulcer of right buttock, stage 4: Secondary | ICD-10-CM | POA: Diagnosis not present

## 2021-10-11 DIAGNOSIS — G35 Multiple sclerosis: Secondary | ICD-10-CM | POA: Diagnosis not present

## 2021-10-11 DIAGNOSIS — G8220 Paraplegia, unspecified: Secondary | ICD-10-CM | POA: Diagnosis not present

## 2021-10-11 DIAGNOSIS — I10 Essential (primary) hypertension: Secondary | ICD-10-CM | POA: Diagnosis not present

## 2021-10-11 DIAGNOSIS — N8184 Pelvic muscle wasting: Secondary | ICD-10-CM | POA: Diagnosis not present

## 2021-10-11 DIAGNOSIS — E440 Moderate protein-calorie malnutrition: Secondary | ICD-10-CM | POA: Diagnosis not present

## 2021-10-11 DIAGNOSIS — G8929 Other chronic pain: Secondary | ICD-10-CM | POA: Diagnosis not present

## 2021-10-16 DIAGNOSIS — N200 Calculus of kidney: Secondary | ICD-10-CM | POA: Diagnosis not present

## 2021-10-16 DIAGNOSIS — G8929 Other chronic pain: Secondary | ICD-10-CM | POA: Diagnosis not present

## 2021-10-16 DIAGNOSIS — G35 Multiple sclerosis: Secondary | ICD-10-CM | POA: Diagnosis not present

## 2021-10-16 DIAGNOSIS — N8184 Pelvic muscle wasting: Secondary | ICD-10-CM | POA: Diagnosis not present

## 2021-10-16 DIAGNOSIS — I10 Essential (primary) hypertension: Secondary | ICD-10-CM | POA: Diagnosis not present

## 2021-10-16 DIAGNOSIS — E440 Moderate protein-calorie malnutrition: Secondary | ICD-10-CM | POA: Diagnosis not present

## 2021-10-16 DIAGNOSIS — G8220 Paraplegia, unspecified: Secondary | ICD-10-CM | POA: Diagnosis not present

## 2021-10-16 DIAGNOSIS — I700 Atherosclerosis of aorta: Secondary | ICD-10-CM | POA: Diagnosis not present

## 2021-10-16 DIAGNOSIS — L89314 Pressure ulcer of right buttock, stage 4: Secondary | ICD-10-CM | POA: Diagnosis not present

## 2021-10-18 DIAGNOSIS — L89314 Pressure ulcer of right buttock, stage 4: Secondary | ICD-10-CM | POA: Diagnosis not present

## 2021-10-18 DIAGNOSIS — N200 Calculus of kidney: Secondary | ICD-10-CM | POA: Diagnosis not present

## 2021-10-18 DIAGNOSIS — N8184 Pelvic muscle wasting: Secondary | ICD-10-CM | POA: Diagnosis not present

## 2021-10-18 DIAGNOSIS — I10 Essential (primary) hypertension: Secondary | ICD-10-CM | POA: Diagnosis not present

## 2021-10-18 DIAGNOSIS — G8220 Paraplegia, unspecified: Secondary | ICD-10-CM | POA: Diagnosis not present

## 2021-10-18 DIAGNOSIS — I700 Atherosclerosis of aorta: Secondary | ICD-10-CM | POA: Diagnosis not present

## 2021-10-18 DIAGNOSIS — E440 Moderate protein-calorie malnutrition: Secondary | ICD-10-CM | POA: Diagnosis not present

## 2021-10-18 DIAGNOSIS — G35 Multiple sclerosis: Secondary | ICD-10-CM | POA: Diagnosis not present

## 2021-10-18 DIAGNOSIS — G8929 Other chronic pain: Secondary | ICD-10-CM | POA: Diagnosis not present

## 2021-10-23 DIAGNOSIS — N8184 Pelvic muscle wasting: Secondary | ICD-10-CM | POA: Diagnosis not present

## 2021-10-23 DIAGNOSIS — I700 Atherosclerosis of aorta: Secondary | ICD-10-CM | POA: Diagnosis not present

## 2021-10-23 DIAGNOSIS — G8929 Other chronic pain: Secondary | ICD-10-CM | POA: Diagnosis not present

## 2021-10-23 DIAGNOSIS — I10 Essential (primary) hypertension: Secondary | ICD-10-CM | POA: Diagnosis not present

## 2021-10-23 DIAGNOSIS — G8220 Paraplegia, unspecified: Secondary | ICD-10-CM | POA: Diagnosis not present

## 2021-10-23 DIAGNOSIS — L89314 Pressure ulcer of right buttock, stage 4: Secondary | ICD-10-CM | POA: Diagnosis not present

## 2021-10-23 DIAGNOSIS — N200 Calculus of kidney: Secondary | ICD-10-CM | POA: Diagnosis not present

## 2021-10-23 DIAGNOSIS — G35 Multiple sclerosis: Secondary | ICD-10-CM | POA: Diagnosis not present

## 2021-10-23 DIAGNOSIS — E440 Moderate protein-calorie malnutrition: Secondary | ICD-10-CM | POA: Diagnosis not present

## 2021-10-26 DIAGNOSIS — N200 Calculus of kidney: Secondary | ICD-10-CM | POA: Diagnosis not present

## 2021-10-26 DIAGNOSIS — I10 Essential (primary) hypertension: Secondary | ICD-10-CM | POA: Diagnosis not present

## 2021-10-26 DIAGNOSIS — E440 Moderate protein-calorie malnutrition: Secondary | ICD-10-CM | POA: Diagnosis not present

## 2021-10-26 DIAGNOSIS — G35 Multiple sclerosis: Secondary | ICD-10-CM | POA: Diagnosis not present

## 2021-10-26 DIAGNOSIS — L89314 Pressure ulcer of right buttock, stage 4: Secondary | ICD-10-CM | POA: Diagnosis not present

## 2021-10-26 DIAGNOSIS — I700 Atherosclerosis of aorta: Secondary | ICD-10-CM | POA: Diagnosis not present

## 2021-10-26 DIAGNOSIS — N8184 Pelvic muscle wasting: Secondary | ICD-10-CM | POA: Diagnosis not present

## 2021-10-26 DIAGNOSIS — G8220 Paraplegia, unspecified: Secondary | ICD-10-CM | POA: Diagnosis not present

## 2021-10-26 DIAGNOSIS — G8929 Other chronic pain: Secondary | ICD-10-CM | POA: Diagnosis not present

## 2021-10-29 DIAGNOSIS — N8184 Pelvic muscle wasting: Secondary | ICD-10-CM | POA: Diagnosis not present

## 2021-10-29 DIAGNOSIS — I10 Essential (primary) hypertension: Secondary | ICD-10-CM | POA: Diagnosis not present

## 2021-10-29 DIAGNOSIS — E440 Moderate protein-calorie malnutrition: Secondary | ICD-10-CM | POA: Diagnosis not present

## 2021-10-29 DIAGNOSIS — I700 Atherosclerosis of aorta: Secondary | ICD-10-CM | POA: Diagnosis not present

## 2021-10-29 DIAGNOSIS — G8929 Other chronic pain: Secondary | ICD-10-CM | POA: Diagnosis not present

## 2021-10-29 DIAGNOSIS — G35 Multiple sclerosis: Secondary | ICD-10-CM | POA: Diagnosis not present

## 2021-10-29 DIAGNOSIS — G8220 Paraplegia, unspecified: Secondary | ICD-10-CM | POA: Diagnosis not present

## 2021-10-29 DIAGNOSIS — N200 Calculus of kidney: Secondary | ICD-10-CM | POA: Diagnosis not present

## 2021-10-29 DIAGNOSIS — L89314 Pressure ulcer of right buttock, stage 4: Secondary | ICD-10-CM | POA: Diagnosis not present

## 2021-11-01 DIAGNOSIS — G35 Multiple sclerosis: Secondary | ICD-10-CM | POA: Diagnosis not present

## 2021-11-01 DIAGNOSIS — I10 Essential (primary) hypertension: Secondary | ICD-10-CM | POA: Diagnosis not present

## 2021-11-01 DIAGNOSIS — N8184 Pelvic muscle wasting: Secondary | ICD-10-CM | POA: Diagnosis not present

## 2021-11-01 DIAGNOSIS — L89314 Pressure ulcer of right buttock, stage 4: Secondary | ICD-10-CM | POA: Diagnosis not present

## 2021-11-01 DIAGNOSIS — E440 Moderate protein-calorie malnutrition: Secondary | ICD-10-CM | POA: Diagnosis not present

## 2021-11-01 DIAGNOSIS — N200 Calculus of kidney: Secondary | ICD-10-CM | POA: Diagnosis not present

## 2021-11-01 DIAGNOSIS — G8220 Paraplegia, unspecified: Secondary | ICD-10-CM | POA: Diagnosis not present

## 2021-11-01 DIAGNOSIS — I700 Atherosclerosis of aorta: Secondary | ICD-10-CM | POA: Diagnosis not present

## 2021-11-01 DIAGNOSIS — G8929 Other chronic pain: Secondary | ICD-10-CM | POA: Diagnosis not present

## 2021-11-06 DIAGNOSIS — I700 Atherosclerosis of aorta: Secondary | ICD-10-CM | POA: Diagnosis not present

## 2021-11-06 DIAGNOSIS — G8929 Other chronic pain: Secondary | ICD-10-CM | POA: Diagnosis not present

## 2021-11-06 DIAGNOSIS — G8220 Paraplegia, unspecified: Secondary | ICD-10-CM | POA: Diagnosis not present

## 2021-11-06 DIAGNOSIS — G35 Multiple sclerosis: Secondary | ICD-10-CM | POA: Diagnosis not present

## 2021-11-06 DIAGNOSIS — I10 Essential (primary) hypertension: Secondary | ICD-10-CM | POA: Diagnosis not present

## 2021-11-06 DIAGNOSIS — L89314 Pressure ulcer of right buttock, stage 4: Secondary | ICD-10-CM | POA: Diagnosis not present

## 2021-11-06 DIAGNOSIS — E440 Moderate protein-calorie malnutrition: Secondary | ICD-10-CM | POA: Diagnosis not present

## 2021-11-06 DIAGNOSIS — N200 Calculus of kidney: Secondary | ICD-10-CM | POA: Diagnosis not present

## 2021-11-06 DIAGNOSIS — N8184 Pelvic muscle wasting: Secondary | ICD-10-CM | POA: Diagnosis not present

## 2021-11-08 DIAGNOSIS — G8220 Paraplegia, unspecified: Secondary | ICD-10-CM | POA: Diagnosis not present

## 2021-11-08 DIAGNOSIS — I10 Essential (primary) hypertension: Secondary | ICD-10-CM | POA: Diagnosis not present

## 2021-11-08 DIAGNOSIS — N8184 Pelvic muscle wasting: Secondary | ICD-10-CM | POA: Diagnosis not present

## 2021-11-08 DIAGNOSIS — L89314 Pressure ulcer of right buttock, stage 4: Secondary | ICD-10-CM | POA: Diagnosis not present

## 2021-11-08 DIAGNOSIS — G35 Multiple sclerosis: Secondary | ICD-10-CM | POA: Diagnosis not present

## 2021-11-08 DIAGNOSIS — I700 Atherosclerosis of aorta: Secondary | ICD-10-CM | POA: Diagnosis not present

## 2021-11-08 DIAGNOSIS — E440 Moderate protein-calorie malnutrition: Secondary | ICD-10-CM | POA: Diagnosis not present

## 2021-11-08 DIAGNOSIS — N200 Calculus of kidney: Secondary | ICD-10-CM | POA: Diagnosis not present

## 2021-11-08 DIAGNOSIS — G8929 Other chronic pain: Secondary | ICD-10-CM | POA: Diagnosis not present

## 2021-11-13 DIAGNOSIS — I700 Atherosclerosis of aorta: Secondary | ICD-10-CM | POA: Diagnosis not present

## 2021-11-13 DIAGNOSIS — N8184 Pelvic muscle wasting: Secondary | ICD-10-CM | POA: Diagnosis not present

## 2021-11-13 DIAGNOSIS — G8929 Other chronic pain: Secondary | ICD-10-CM | POA: Diagnosis not present

## 2021-11-13 DIAGNOSIS — G35 Multiple sclerosis: Secondary | ICD-10-CM | POA: Diagnosis not present

## 2021-11-13 DIAGNOSIS — E440 Moderate protein-calorie malnutrition: Secondary | ICD-10-CM | POA: Diagnosis not present

## 2021-11-13 DIAGNOSIS — G8220 Paraplegia, unspecified: Secondary | ICD-10-CM | POA: Diagnosis not present

## 2021-11-13 DIAGNOSIS — N200 Calculus of kidney: Secondary | ICD-10-CM | POA: Diagnosis not present

## 2021-11-13 DIAGNOSIS — I10 Essential (primary) hypertension: Secondary | ICD-10-CM | POA: Diagnosis not present

## 2021-11-13 DIAGNOSIS — L89314 Pressure ulcer of right buttock, stage 4: Secondary | ICD-10-CM | POA: Diagnosis not present

## 2021-11-16 DIAGNOSIS — I10 Essential (primary) hypertension: Secondary | ICD-10-CM | POA: Diagnosis not present

## 2021-11-16 DIAGNOSIS — G8220 Paraplegia, unspecified: Secondary | ICD-10-CM | POA: Diagnosis not present

## 2021-11-16 DIAGNOSIS — E440 Moderate protein-calorie malnutrition: Secondary | ICD-10-CM | POA: Diagnosis not present

## 2021-11-16 DIAGNOSIS — G35 Multiple sclerosis: Secondary | ICD-10-CM | POA: Diagnosis not present

## 2021-11-16 DIAGNOSIS — N200 Calculus of kidney: Secondary | ICD-10-CM | POA: Diagnosis not present

## 2021-11-16 DIAGNOSIS — G8929 Other chronic pain: Secondary | ICD-10-CM | POA: Diagnosis not present

## 2021-11-16 DIAGNOSIS — I700 Atherosclerosis of aorta: Secondary | ICD-10-CM | POA: Diagnosis not present

## 2021-11-16 DIAGNOSIS — N8184 Pelvic muscle wasting: Secondary | ICD-10-CM | POA: Diagnosis not present

## 2021-11-16 DIAGNOSIS — L89314 Pressure ulcer of right buttock, stage 4: Secondary | ICD-10-CM | POA: Diagnosis not present

## 2021-11-20 DIAGNOSIS — G35 Multiple sclerosis: Secondary | ICD-10-CM | POA: Diagnosis not present

## 2021-11-20 DIAGNOSIS — E440 Moderate protein-calorie malnutrition: Secondary | ICD-10-CM | POA: Diagnosis not present

## 2021-11-20 DIAGNOSIS — N8184 Pelvic muscle wasting: Secondary | ICD-10-CM | POA: Diagnosis not present

## 2021-11-20 DIAGNOSIS — I700 Atherosclerosis of aorta: Secondary | ICD-10-CM | POA: Diagnosis not present

## 2021-11-20 DIAGNOSIS — L89314 Pressure ulcer of right buttock, stage 4: Secondary | ICD-10-CM | POA: Diagnosis not present

## 2021-11-20 DIAGNOSIS — G8220 Paraplegia, unspecified: Secondary | ICD-10-CM | POA: Diagnosis not present

## 2021-11-20 DIAGNOSIS — I10 Essential (primary) hypertension: Secondary | ICD-10-CM | POA: Diagnosis not present

## 2021-11-20 DIAGNOSIS — N200 Calculus of kidney: Secondary | ICD-10-CM | POA: Diagnosis not present

## 2021-11-20 DIAGNOSIS — G8929 Other chronic pain: Secondary | ICD-10-CM | POA: Diagnosis not present

## 2021-11-23 DIAGNOSIS — L89314 Pressure ulcer of right buttock, stage 4: Secondary | ICD-10-CM | POA: Diagnosis not present

## 2021-11-23 DIAGNOSIS — G8220 Paraplegia, unspecified: Secondary | ICD-10-CM | POA: Diagnosis not present

## 2021-11-23 DIAGNOSIS — N200 Calculus of kidney: Secondary | ICD-10-CM | POA: Diagnosis not present

## 2021-11-23 DIAGNOSIS — N8184 Pelvic muscle wasting: Secondary | ICD-10-CM | POA: Diagnosis not present

## 2021-11-23 DIAGNOSIS — G8929 Other chronic pain: Secondary | ICD-10-CM | POA: Diagnosis not present

## 2021-11-23 DIAGNOSIS — I700 Atherosclerosis of aorta: Secondary | ICD-10-CM | POA: Diagnosis not present

## 2021-11-23 DIAGNOSIS — I10 Essential (primary) hypertension: Secondary | ICD-10-CM | POA: Diagnosis not present

## 2021-11-23 DIAGNOSIS — G35 Multiple sclerosis: Secondary | ICD-10-CM | POA: Diagnosis not present

## 2021-11-23 DIAGNOSIS — E440 Moderate protein-calorie malnutrition: Secondary | ICD-10-CM | POA: Diagnosis not present

## 2021-11-27 ENCOUNTER — Encounter (HOSPITAL_BASED_OUTPATIENT_CLINIC_OR_DEPARTMENT_OTHER): Payer: Medicare PPO | Admitting: Internal Medicine

## 2021-11-27 DIAGNOSIS — N200 Calculus of kidney: Secondary | ICD-10-CM | POA: Diagnosis not present

## 2021-11-27 DIAGNOSIS — G8220 Paraplegia, unspecified: Secondary | ICD-10-CM | POA: Diagnosis not present

## 2021-11-27 DIAGNOSIS — L89314 Pressure ulcer of right buttock, stage 4: Secondary | ICD-10-CM | POA: Diagnosis not present

## 2021-11-27 DIAGNOSIS — I10 Essential (primary) hypertension: Secondary | ICD-10-CM | POA: Diagnosis not present

## 2021-11-27 DIAGNOSIS — G35 Multiple sclerosis: Secondary | ICD-10-CM | POA: Diagnosis not present

## 2021-11-27 DIAGNOSIS — G8929 Other chronic pain: Secondary | ICD-10-CM | POA: Diagnosis not present

## 2021-11-27 DIAGNOSIS — N8184 Pelvic muscle wasting: Secondary | ICD-10-CM | POA: Diagnosis not present

## 2021-11-27 DIAGNOSIS — E440 Moderate protein-calorie malnutrition: Secondary | ICD-10-CM | POA: Diagnosis not present

## 2021-11-27 DIAGNOSIS — I700 Atherosclerosis of aorta: Secondary | ICD-10-CM | POA: Diagnosis not present

## 2021-11-29 DIAGNOSIS — G8220 Paraplegia, unspecified: Secondary | ICD-10-CM | POA: Diagnosis not present

## 2021-11-29 DIAGNOSIS — G35 Multiple sclerosis: Secondary | ICD-10-CM | POA: Diagnosis not present

## 2021-11-29 DIAGNOSIS — G8929 Other chronic pain: Secondary | ICD-10-CM | POA: Diagnosis not present

## 2021-11-29 DIAGNOSIS — I700 Atherosclerosis of aorta: Secondary | ICD-10-CM | POA: Diagnosis not present

## 2021-11-29 DIAGNOSIS — N200 Calculus of kidney: Secondary | ICD-10-CM | POA: Diagnosis not present

## 2021-11-29 DIAGNOSIS — N8184 Pelvic muscle wasting: Secondary | ICD-10-CM | POA: Diagnosis not present

## 2021-11-29 DIAGNOSIS — L89314 Pressure ulcer of right buttock, stage 4: Secondary | ICD-10-CM | POA: Diagnosis not present

## 2021-11-29 DIAGNOSIS — E440 Moderate protein-calorie malnutrition: Secondary | ICD-10-CM | POA: Diagnosis not present

## 2021-11-29 DIAGNOSIS — I10 Essential (primary) hypertension: Secondary | ICD-10-CM | POA: Diagnosis not present

## 2021-12-04 DIAGNOSIS — I700 Atherosclerosis of aorta: Secondary | ICD-10-CM | POA: Diagnosis not present

## 2021-12-04 DIAGNOSIS — N8184 Pelvic muscle wasting: Secondary | ICD-10-CM | POA: Diagnosis not present

## 2021-12-04 DIAGNOSIS — G8220 Paraplegia, unspecified: Secondary | ICD-10-CM | POA: Diagnosis not present

## 2021-12-04 DIAGNOSIS — E440 Moderate protein-calorie malnutrition: Secondary | ICD-10-CM | POA: Diagnosis not present

## 2021-12-04 DIAGNOSIS — G35 Multiple sclerosis: Secondary | ICD-10-CM | POA: Diagnosis not present

## 2021-12-04 DIAGNOSIS — G8929 Other chronic pain: Secondary | ICD-10-CM | POA: Diagnosis not present

## 2021-12-04 DIAGNOSIS — I10 Essential (primary) hypertension: Secondary | ICD-10-CM | POA: Diagnosis not present

## 2021-12-04 DIAGNOSIS — N200 Calculus of kidney: Secondary | ICD-10-CM | POA: Diagnosis not present

## 2021-12-04 DIAGNOSIS — L89314 Pressure ulcer of right buttock, stage 4: Secondary | ICD-10-CM | POA: Diagnosis not present

## 2021-12-06 DIAGNOSIS — I10 Essential (primary) hypertension: Secondary | ICD-10-CM | POA: Diagnosis not present

## 2021-12-06 DIAGNOSIS — N200 Calculus of kidney: Secondary | ICD-10-CM | POA: Diagnosis not present

## 2021-12-06 DIAGNOSIS — G35 Multiple sclerosis: Secondary | ICD-10-CM | POA: Diagnosis not present

## 2021-12-06 DIAGNOSIS — G8929 Other chronic pain: Secondary | ICD-10-CM | POA: Diagnosis not present

## 2021-12-06 DIAGNOSIS — N8184 Pelvic muscle wasting: Secondary | ICD-10-CM | POA: Diagnosis not present

## 2021-12-06 DIAGNOSIS — I700 Atherosclerosis of aorta: Secondary | ICD-10-CM | POA: Diagnosis not present

## 2021-12-06 DIAGNOSIS — E440 Moderate protein-calorie malnutrition: Secondary | ICD-10-CM | POA: Diagnosis not present

## 2021-12-06 DIAGNOSIS — G8220 Paraplegia, unspecified: Secondary | ICD-10-CM | POA: Diagnosis not present

## 2021-12-06 DIAGNOSIS — L89314 Pressure ulcer of right buttock, stage 4: Secondary | ICD-10-CM | POA: Diagnosis not present

## 2021-12-11 DIAGNOSIS — N200 Calculus of kidney: Secondary | ICD-10-CM | POA: Diagnosis not present

## 2021-12-11 DIAGNOSIS — G8220 Paraplegia, unspecified: Secondary | ICD-10-CM | POA: Diagnosis not present

## 2021-12-11 DIAGNOSIS — I700 Atherosclerosis of aorta: Secondary | ICD-10-CM | POA: Diagnosis not present

## 2021-12-11 DIAGNOSIS — I10 Essential (primary) hypertension: Secondary | ICD-10-CM | POA: Diagnosis not present

## 2021-12-11 DIAGNOSIS — N8184 Pelvic muscle wasting: Secondary | ICD-10-CM | POA: Diagnosis not present

## 2021-12-11 DIAGNOSIS — L89314 Pressure ulcer of right buttock, stage 4: Secondary | ICD-10-CM | POA: Diagnosis not present

## 2021-12-11 DIAGNOSIS — G35 Multiple sclerosis: Secondary | ICD-10-CM | POA: Diagnosis not present

## 2021-12-11 DIAGNOSIS — G8929 Other chronic pain: Secondary | ICD-10-CM | POA: Diagnosis not present

## 2021-12-11 DIAGNOSIS — E440 Moderate protein-calorie malnutrition: Secondary | ICD-10-CM | POA: Diagnosis not present

## 2021-12-13 DIAGNOSIS — G8220 Paraplegia, unspecified: Secondary | ICD-10-CM | POA: Diagnosis not present

## 2021-12-13 DIAGNOSIS — I10 Essential (primary) hypertension: Secondary | ICD-10-CM | POA: Diagnosis not present

## 2021-12-13 DIAGNOSIS — I700 Atherosclerosis of aorta: Secondary | ICD-10-CM | POA: Diagnosis not present

## 2021-12-13 DIAGNOSIS — G8929 Other chronic pain: Secondary | ICD-10-CM | POA: Diagnosis not present

## 2021-12-13 DIAGNOSIS — E440 Moderate protein-calorie malnutrition: Secondary | ICD-10-CM | POA: Diagnosis not present

## 2021-12-13 DIAGNOSIS — N200 Calculus of kidney: Secondary | ICD-10-CM | POA: Diagnosis not present

## 2021-12-13 DIAGNOSIS — G35 Multiple sclerosis: Secondary | ICD-10-CM | POA: Diagnosis not present

## 2021-12-13 DIAGNOSIS — N8184 Pelvic muscle wasting: Secondary | ICD-10-CM | POA: Diagnosis not present

## 2021-12-13 DIAGNOSIS — L89314 Pressure ulcer of right buttock, stage 4: Secondary | ICD-10-CM | POA: Diagnosis not present

## 2021-12-18 DIAGNOSIS — I10 Essential (primary) hypertension: Secondary | ICD-10-CM | POA: Diagnosis not present

## 2021-12-18 DIAGNOSIS — G35 Multiple sclerosis: Secondary | ICD-10-CM | POA: Diagnosis not present

## 2021-12-18 DIAGNOSIS — L89314 Pressure ulcer of right buttock, stage 4: Secondary | ICD-10-CM | POA: Diagnosis not present

## 2021-12-18 DIAGNOSIS — N8184 Pelvic muscle wasting: Secondary | ICD-10-CM | POA: Diagnosis not present

## 2021-12-18 DIAGNOSIS — N200 Calculus of kidney: Secondary | ICD-10-CM | POA: Diagnosis not present

## 2021-12-18 DIAGNOSIS — G8220 Paraplegia, unspecified: Secondary | ICD-10-CM | POA: Diagnosis not present

## 2021-12-18 DIAGNOSIS — E440 Moderate protein-calorie malnutrition: Secondary | ICD-10-CM | POA: Diagnosis not present

## 2021-12-18 DIAGNOSIS — I700 Atherosclerosis of aorta: Secondary | ICD-10-CM | POA: Diagnosis not present

## 2021-12-18 DIAGNOSIS — G8929 Other chronic pain: Secondary | ICD-10-CM | POA: Diagnosis not present

## 2021-12-20 DIAGNOSIS — L89314 Pressure ulcer of right buttock, stage 4: Secondary | ICD-10-CM | POA: Diagnosis not present

## 2021-12-20 DIAGNOSIS — I700 Atherosclerosis of aorta: Secondary | ICD-10-CM | POA: Diagnosis not present

## 2021-12-20 DIAGNOSIS — G8220 Paraplegia, unspecified: Secondary | ICD-10-CM | POA: Diagnosis not present

## 2021-12-20 DIAGNOSIS — E440 Moderate protein-calorie malnutrition: Secondary | ICD-10-CM | POA: Diagnosis not present

## 2021-12-20 DIAGNOSIS — G8929 Other chronic pain: Secondary | ICD-10-CM | POA: Diagnosis not present

## 2021-12-20 DIAGNOSIS — I10 Essential (primary) hypertension: Secondary | ICD-10-CM | POA: Diagnosis not present

## 2021-12-20 DIAGNOSIS — N8184 Pelvic muscle wasting: Secondary | ICD-10-CM | POA: Diagnosis not present

## 2021-12-20 DIAGNOSIS — N200 Calculus of kidney: Secondary | ICD-10-CM | POA: Diagnosis not present

## 2021-12-20 DIAGNOSIS — G35 Multiple sclerosis: Secondary | ICD-10-CM | POA: Diagnosis not present

## 2021-12-24 DIAGNOSIS — E440 Moderate protein-calorie malnutrition: Secondary | ICD-10-CM | POA: Diagnosis not present

## 2021-12-24 DIAGNOSIS — G8929 Other chronic pain: Secondary | ICD-10-CM | POA: Diagnosis not present

## 2021-12-24 DIAGNOSIS — G35 Multiple sclerosis: Secondary | ICD-10-CM | POA: Diagnosis not present

## 2021-12-24 DIAGNOSIS — I700 Atherosclerosis of aorta: Secondary | ICD-10-CM | POA: Diagnosis not present

## 2021-12-24 DIAGNOSIS — L89314 Pressure ulcer of right buttock, stage 4: Secondary | ICD-10-CM | POA: Diagnosis not present

## 2021-12-24 DIAGNOSIS — N8184 Pelvic muscle wasting: Secondary | ICD-10-CM | POA: Diagnosis not present

## 2021-12-24 DIAGNOSIS — I10 Essential (primary) hypertension: Secondary | ICD-10-CM | POA: Diagnosis not present

## 2021-12-24 DIAGNOSIS — G8220 Paraplegia, unspecified: Secondary | ICD-10-CM | POA: Diagnosis not present

## 2021-12-24 DIAGNOSIS — N200 Calculus of kidney: Secondary | ICD-10-CM | POA: Diagnosis not present

## 2021-12-27 DIAGNOSIS — G8220 Paraplegia, unspecified: Secondary | ICD-10-CM | POA: Diagnosis not present

## 2021-12-27 DIAGNOSIS — G8929 Other chronic pain: Secondary | ICD-10-CM | POA: Diagnosis not present

## 2021-12-27 DIAGNOSIS — G35 Multiple sclerosis: Secondary | ICD-10-CM | POA: Diagnosis not present

## 2021-12-27 DIAGNOSIS — L89314 Pressure ulcer of right buttock, stage 4: Secondary | ICD-10-CM | POA: Diagnosis not present

## 2021-12-27 DIAGNOSIS — N8184 Pelvic muscle wasting: Secondary | ICD-10-CM | POA: Diagnosis not present

## 2021-12-27 DIAGNOSIS — N200 Calculus of kidney: Secondary | ICD-10-CM | POA: Diagnosis not present

## 2021-12-27 DIAGNOSIS — E440 Moderate protein-calorie malnutrition: Secondary | ICD-10-CM | POA: Diagnosis not present

## 2021-12-27 DIAGNOSIS — I700 Atherosclerosis of aorta: Secondary | ICD-10-CM | POA: Diagnosis not present

## 2021-12-27 DIAGNOSIS — I10 Essential (primary) hypertension: Secondary | ICD-10-CM | POA: Diagnosis not present

## 2021-12-31 DIAGNOSIS — E440 Moderate protein-calorie malnutrition: Secondary | ICD-10-CM | POA: Diagnosis not present

## 2021-12-31 DIAGNOSIS — G8220 Paraplegia, unspecified: Secondary | ICD-10-CM | POA: Diagnosis not present

## 2021-12-31 DIAGNOSIS — L89314 Pressure ulcer of right buttock, stage 4: Secondary | ICD-10-CM | POA: Diagnosis not present

## 2021-12-31 DIAGNOSIS — N8184 Pelvic muscle wasting: Secondary | ICD-10-CM | POA: Diagnosis not present

## 2021-12-31 DIAGNOSIS — N200 Calculus of kidney: Secondary | ICD-10-CM | POA: Diagnosis not present

## 2021-12-31 DIAGNOSIS — I700 Atherosclerosis of aorta: Secondary | ICD-10-CM | POA: Diagnosis not present

## 2021-12-31 DIAGNOSIS — G8929 Other chronic pain: Secondary | ICD-10-CM | POA: Diagnosis not present

## 2021-12-31 DIAGNOSIS — G35 Multiple sclerosis: Secondary | ICD-10-CM | POA: Diagnosis not present

## 2021-12-31 DIAGNOSIS — I10 Essential (primary) hypertension: Secondary | ICD-10-CM | POA: Diagnosis not present

## 2022-01-03 DIAGNOSIS — N200 Calculus of kidney: Secondary | ICD-10-CM | POA: Diagnosis not present

## 2022-01-03 DIAGNOSIS — I700 Atherosclerosis of aorta: Secondary | ICD-10-CM | POA: Diagnosis not present

## 2022-01-03 DIAGNOSIS — N8184 Pelvic muscle wasting: Secondary | ICD-10-CM | POA: Diagnosis not present

## 2022-01-03 DIAGNOSIS — G35 Multiple sclerosis: Secondary | ICD-10-CM | POA: Diagnosis not present

## 2022-01-03 DIAGNOSIS — E440 Moderate protein-calorie malnutrition: Secondary | ICD-10-CM | POA: Diagnosis not present

## 2022-01-03 DIAGNOSIS — L89314 Pressure ulcer of right buttock, stage 4: Secondary | ICD-10-CM | POA: Diagnosis not present

## 2022-01-03 DIAGNOSIS — I10 Essential (primary) hypertension: Secondary | ICD-10-CM | POA: Diagnosis not present

## 2022-01-03 DIAGNOSIS — G8929 Other chronic pain: Secondary | ICD-10-CM | POA: Diagnosis not present

## 2022-01-03 DIAGNOSIS — G8220 Paraplegia, unspecified: Secondary | ICD-10-CM | POA: Diagnosis not present

## 2022-01-07 DIAGNOSIS — I700 Atherosclerosis of aorta: Secondary | ICD-10-CM | POA: Diagnosis not present

## 2022-01-07 DIAGNOSIS — N200 Calculus of kidney: Secondary | ICD-10-CM | POA: Diagnosis not present

## 2022-01-07 DIAGNOSIS — G8929 Other chronic pain: Secondary | ICD-10-CM | POA: Diagnosis not present

## 2022-01-07 DIAGNOSIS — N8184 Pelvic muscle wasting: Secondary | ICD-10-CM | POA: Diagnosis not present

## 2022-01-07 DIAGNOSIS — I10 Essential (primary) hypertension: Secondary | ICD-10-CM | POA: Diagnosis not present

## 2022-01-07 DIAGNOSIS — G8220 Paraplegia, unspecified: Secondary | ICD-10-CM | POA: Diagnosis not present

## 2022-01-07 DIAGNOSIS — L89314 Pressure ulcer of right buttock, stage 4: Secondary | ICD-10-CM | POA: Diagnosis not present

## 2022-01-07 DIAGNOSIS — E440 Moderate protein-calorie malnutrition: Secondary | ICD-10-CM | POA: Diagnosis not present

## 2022-01-07 DIAGNOSIS — G35 Multiple sclerosis: Secondary | ICD-10-CM | POA: Diagnosis not present

## 2022-01-11 DIAGNOSIS — G8929 Other chronic pain: Secondary | ICD-10-CM | POA: Diagnosis not present

## 2022-01-11 DIAGNOSIS — I10 Essential (primary) hypertension: Secondary | ICD-10-CM | POA: Diagnosis not present

## 2022-01-11 DIAGNOSIS — G35 Multiple sclerosis: Secondary | ICD-10-CM | POA: Diagnosis not present

## 2022-01-11 DIAGNOSIS — G8220 Paraplegia, unspecified: Secondary | ICD-10-CM | POA: Diagnosis not present

## 2022-01-11 DIAGNOSIS — N8184 Pelvic muscle wasting: Secondary | ICD-10-CM | POA: Diagnosis not present

## 2022-01-11 DIAGNOSIS — E440 Moderate protein-calorie malnutrition: Secondary | ICD-10-CM | POA: Diagnosis not present

## 2022-01-11 DIAGNOSIS — N200 Calculus of kidney: Secondary | ICD-10-CM | POA: Diagnosis not present

## 2022-01-11 DIAGNOSIS — I700 Atherosclerosis of aorta: Secondary | ICD-10-CM | POA: Diagnosis not present

## 2022-01-11 DIAGNOSIS — L89314 Pressure ulcer of right buttock, stage 4: Secondary | ICD-10-CM | POA: Diagnosis not present

## 2022-01-15 DIAGNOSIS — N200 Calculus of kidney: Secondary | ICD-10-CM | POA: Diagnosis not present

## 2022-01-15 DIAGNOSIS — G8929 Other chronic pain: Secondary | ICD-10-CM | POA: Diagnosis not present

## 2022-01-15 DIAGNOSIS — G35 Multiple sclerosis: Secondary | ICD-10-CM | POA: Diagnosis not present

## 2022-01-15 DIAGNOSIS — G8220 Paraplegia, unspecified: Secondary | ICD-10-CM | POA: Diagnosis not present

## 2022-01-15 DIAGNOSIS — L89314 Pressure ulcer of right buttock, stage 4: Secondary | ICD-10-CM | POA: Diagnosis not present

## 2022-01-15 DIAGNOSIS — I10 Essential (primary) hypertension: Secondary | ICD-10-CM | POA: Diagnosis not present

## 2022-01-15 DIAGNOSIS — I700 Atherosclerosis of aorta: Secondary | ICD-10-CM | POA: Diagnosis not present

## 2022-01-15 DIAGNOSIS — E440 Moderate protein-calorie malnutrition: Secondary | ICD-10-CM | POA: Diagnosis not present

## 2022-01-15 DIAGNOSIS — N8184 Pelvic muscle wasting: Secondary | ICD-10-CM | POA: Diagnosis not present

## 2022-01-18 DIAGNOSIS — G8929 Other chronic pain: Secondary | ICD-10-CM | POA: Diagnosis not present

## 2022-01-18 DIAGNOSIS — G35 Multiple sclerosis: Secondary | ICD-10-CM | POA: Diagnosis not present

## 2022-01-18 DIAGNOSIS — L89314 Pressure ulcer of right buttock, stage 4: Secondary | ICD-10-CM | POA: Diagnosis not present

## 2022-01-18 DIAGNOSIS — N8184 Pelvic muscle wasting: Secondary | ICD-10-CM | POA: Diagnosis not present

## 2022-01-18 DIAGNOSIS — N200 Calculus of kidney: Secondary | ICD-10-CM | POA: Diagnosis not present

## 2022-01-18 DIAGNOSIS — E440 Moderate protein-calorie malnutrition: Secondary | ICD-10-CM | POA: Diagnosis not present

## 2022-01-18 DIAGNOSIS — I700 Atherosclerosis of aorta: Secondary | ICD-10-CM | POA: Diagnosis not present

## 2022-01-18 DIAGNOSIS — I10 Essential (primary) hypertension: Secondary | ICD-10-CM | POA: Diagnosis not present

## 2022-01-18 DIAGNOSIS — G8220 Paraplegia, unspecified: Secondary | ICD-10-CM | POA: Diagnosis not present

## 2022-01-22 DIAGNOSIS — N8184 Pelvic muscle wasting: Secondary | ICD-10-CM | POA: Diagnosis not present

## 2022-01-22 DIAGNOSIS — N200 Calculus of kidney: Secondary | ICD-10-CM | POA: Diagnosis not present

## 2022-01-22 DIAGNOSIS — I700 Atherosclerosis of aorta: Secondary | ICD-10-CM | POA: Diagnosis not present

## 2022-01-22 DIAGNOSIS — G35 Multiple sclerosis: Secondary | ICD-10-CM | POA: Diagnosis not present

## 2022-01-22 DIAGNOSIS — E440 Moderate protein-calorie malnutrition: Secondary | ICD-10-CM | POA: Diagnosis not present

## 2022-01-22 DIAGNOSIS — L89314 Pressure ulcer of right buttock, stage 4: Secondary | ICD-10-CM | POA: Diagnosis not present

## 2022-01-22 DIAGNOSIS — G8220 Paraplegia, unspecified: Secondary | ICD-10-CM | POA: Diagnosis not present

## 2022-01-22 DIAGNOSIS — I10 Essential (primary) hypertension: Secondary | ICD-10-CM | POA: Diagnosis not present

## 2022-01-22 DIAGNOSIS — G8929 Other chronic pain: Secondary | ICD-10-CM | POA: Diagnosis not present

## 2022-01-25 DIAGNOSIS — N200 Calculus of kidney: Secondary | ICD-10-CM | POA: Diagnosis not present

## 2022-01-25 DIAGNOSIS — I700 Atherosclerosis of aorta: Secondary | ICD-10-CM | POA: Diagnosis not present

## 2022-01-25 DIAGNOSIS — G8220 Paraplegia, unspecified: Secondary | ICD-10-CM | POA: Diagnosis not present

## 2022-01-25 DIAGNOSIS — G35 Multiple sclerosis: Secondary | ICD-10-CM | POA: Diagnosis not present

## 2022-01-25 DIAGNOSIS — I10 Essential (primary) hypertension: Secondary | ICD-10-CM | POA: Diagnosis not present

## 2022-01-25 DIAGNOSIS — L89314 Pressure ulcer of right buttock, stage 4: Secondary | ICD-10-CM | POA: Diagnosis not present

## 2022-01-25 DIAGNOSIS — E440 Moderate protein-calorie malnutrition: Secondary | ICD-10-CM | POA: Diagnosis not present

## 2022-01-25 DIAGNOSIS — G8929 Other chronic pain: Secondary | ICD-10-CM | POA: Diagnosis not present

## 2022-01-25 DIAGNOSIS — N8184 Pelvic muscle wasting: Secondary | ICD-10-CM | POA: Diagnosis not present

## 2022-01-29 DIAGNOSIS — N8184 Pelvic muscle wasting: Secondary | ICD-10-CM | POA: Diagnosis not present

## 2022-01-29 DIAGNOSIS — I10 Essential (primary) hypertension: Secondary | ICD-10-CM | POA: Diagnosis not present

## 2022-01-29 DIAGNOSIS — E440 Moderate protein-calorie malnutrition: Secondary | ICD-10-CM | POA: Diagnosis not present

## 2022-01-29 DIAGNOSIS — G8929 Other chronic pain: Secondary | ICD-10-CM | POA: Diagnosis not present

## 2022-01-29 DIAGNOSIS — L89314 Pressure ulcer of right buttock, stage 4: Secondary | ICD-10-CM | POA: Diagnosis not present

## 2022-01-29 DIAGNOSIS — G8220 Paraplegia, unspecified: Secondary | ICD-10-CM | POA: Diagnosis not present

## 2022-01-29 DIAGNOSIS — N200 Calculus of kidney: Secondary | ICD-10-CM | POA: Diagnosis not present

## 2022-01-29 DIAGNOSIS — I700 Atherosclerosis of aorta: Secondary | ICD-10-CM | POA: Diagnosis not present

## 2022-01-29 DIAGNOSIS — G35 Multiple sclerosis: Secondary | ICD-10-CM | POA: Diagnosis not present

## 2022-02-01 DIAGNOSIS — G35 Multiple sclerosis: Secondary | ICD-10-CM | POA: Diagnosis not present

## 2022-02-01 DIAGNOSIS — G8220 Paraplegia, unspecified: Secondary | ICD-10-CM | POA: Diagnosis not present

## 2022-02-01 DIAGNOSIS — G8929 Other chronic pain: Secondary | ICD-10-CM | POA: Diagnosis not present

## 2022-02-01 DIAGNOSIS — I700 Atherosclerosis of aorta: Secondary | ICD-10-CM | POA: Diagnosis not present

## 2022-02-01 DIAGNOSIS — N8184 Pelvic muscle wasting: Secondary | ICD-10-CM | POA: Diagnosis not present

## 2022-02-01 DIAGNOSIS — N200 Calculus of kidney: Secondary | ICD-10-CM | POA: Diagnosis not present

## 2022-02-01 DIAGNOSIS — E440 Moderate protein-calorie malnutrition: Secondary | ICD-10-CM | POA: Diagnosis not present

## 2022-02-01 DIAGNOSIS — I10 Essential (primary) hypertension: Secondary | ICD-10-CM | POA: Diagnosis not present

## 2022-02-01 DIAGNOSIS — L89314 Pressure ulcer of right buttock, stage 4: Secondary | ICD-10-CM | POA: Diagnosis not present

## 2022-02-04 ENCOUNTER — Encounter (HOSPITAL_BASED_OUTPATIENT_CLINIC_OR_DEPARTMENT_OTHER): Payer: Medicare PPO | Attending: Internal Medicine | Admitting: Internal Medicine

## 2022-02-04 DIAGNOSIS — M4628 Osteomyelitis of vertebra, sacral and sacrococcygeal region: Secondary | ICD-10-CM

## 2022-02-04 DIAGNOSIS — L89314 Pressure ulcer of right buttock, stage 4: Secondary | ICD-10-CM | POA: Diagnosis present

## 2022-02-04 DIAGNOSIS — Z87891 Personal history of nicotine dependence: Secondary | ICD-10-CM | POA: Diagnosis not present

## 2022-02-04 DIAGNOSIS — G35 Multiple sclerosis: Secondary | ICD-10-CM

## 2022-02-04 DIAGNOSIS — G8220 Paraplegia, unspecified: Secondary | ICD-10-CM | POA: Diagnosis not present

## 2022-02-04 NOTE — Progress Notes (Signed)
MELITA, BEARCE ([id number]) [phone].pdf Page 1 of 7 Visit Report for 02/04/2022 Chief Complaint Document Details Patient Name: Date of Service: [name], [name] 02/04/2022 10:15 A M Medical Record Number: [id number] Patient Account Number: [id number] Date of Birth/Sex: Treating RN: [birth year] (51 y.o. F) Primary Care Provider: Alvester Chou Other Clinician: Referring Provider: Treating Provider/Extender: Haydee Salter in Treatment: 23 Information Obtained from: Patient Chief Complaint 03/26/21; Right gluteus wound Electronic Signature(s) Signed: 02/04/2022 1:22:55 PM By: Kalman Shan DO Entered By: Kalman Shan on 02/04/2022 11:03:10 -------------------------------------------------------------------------------- HPI Details Patient Name: Date of Service: [name], [name] 02/04/2022 10:15 A M Medical Record Number: [id number] Patient Account Number: [id number] Date of Birth/Sex: Treating RN: 1970/11/01 (51 y.o. F) Primary Care Provider: Alvester Chou Other Clinician: Referring Provider: Treating Provider/Extender: Haydee Salter in Treatment: 62 History of Present Illness HPI Description: Admission 03/26/2021 [name] is a 51 year old female with a past medical history of multiple sclerosis for the past 16 years, complicated by severe debility and bedbound since 2015 that presents to the clinic for a 60-month history of ulcer to the right buttocks. She has been admitted to the hospital several times for this issue. She was last hospitalized on 02/18/2021 for septic shock due to the infected ulcer. She was evaluated by infectious disease at that time. She completed a 2- week course of antibiotics. There is no plan for follow-up. She is currently using calcium alginate dressings. She has home health that comes out twice weekly. She currently denies signs of infection. She has an air mattress for  offloading. Of note She has a history of osteomyelitis to the sacrum and treated in 2020 with IV antibiotics for 8 weeks. 3/21; patient presents for follow-up. She has been using calcium alginate to the wound bed and using Vashe to help clean prior to dressing changes. She has no issues or complaints today. 5/16; patient presents for follow-up. She has been using calcium alginate to the wound bed. She has no issues or complaints today. She is not able to aggressively offload the area due to her multiple sclerosis. 7/18; patient presents for follow-up. She had a televisit with Dr. Linus Salmons on 08/03/2021 for possible osteomyelitis noted on x-ray. She was started on doxycycline and Levaquin and completed her 4-week course. She has been using calcium alginate to the wound bed. She has no issues or complaints today. 11/27; patient missed her last clinic appointment. She was last seen 4 months ago. She has a chronic wound to her right buttocks that has been treated for osteomyelitis. She has been using calcium alginate to the wound bed. She uses a wound cleanser in between dressing changes. She tries to aggressively offload the area. She states that due to cost she cannot come in frequently. Electronic Signature(s) Signed: 02/04/2022 1:22:55 PM By: Kalman Shan DO Entered By: Kalman Shan on 02/04/2022 11:04:30 Vernona Rieger ([id number].pdf Page 2 of 7 -------------------------------------------------------------------------------- Physical Exam Details Patient Name: Date of Service: [name], [name] 02/04/2022 10:15 A M Medical Record Number: [id number] Patient Account Number: [id number] Date of Birth/Sex: Treating RN: [birth year] (51 y.o. F) Primary Care Provider: Alvester Chou Other Clinician: Referring Provider: Treating Provider/Extender: Haydee Salter in Treatment: 45 Constitutional respirations regular, non-labored and within  target range for patient.Marland Kitchen Psychiatric pleasant and cooperative. Notes Right buttocks: Open wound with granulation tissue and undermining. This overlies very closely to bone. No bone exposed. Surrounding skin intact. No rashes.  No signs of surrounding soft tissue infection. Electronic Signature(s) Signed: 02/04/2022 1:22:55 PM By: Geralyn Corwin DO Entered By: Geralyn Corwin on 02/04/2022 11:04:56 -------------------------------------------------------------------------------- Physician Orders Details Patient Name: Date of Service: [name], [name] 02/04/2022 10:15 A M Medical Record Number: [id number] Patient Account Number: [id number] Date of Birth/Sex: Treating RN: Jul 24, 1970 (51 y.o. [name] Primary Care Provider: Marletta Lor Other Clinician: Referring Provider: Treating Provider/Extender: Stasia Cavalier in Treatment: 55 Verbal / Phone Orders: No Diagnosis Coding ICD-10 Coding Code Description L89.314 Pressure ulcer of right buttock, stage 4 G35 Multiple sclerosis M46.28 Osteomyelitis of vertebra, sacral and sacrococcygeal region Follow-up Appointments ppointment in: - 3 months follow up with Dr. Mikey Bussing ******STRETCHER ROOM***** Return A patient may speak with primary to continue orders for wound care and home health due to cost of wound care trips for appts. Bathing/ Shower/ Hygiene May shower with protection but do not get wound dressing(s) wet. Off-Loading Low air-loss mattress (Group 2) Turn and reposition every 2 hours Home Health No change in wound care orders this week; continue Home Health for wound care. May utilize formulary equivalent dressing for wound treatment orders unless otherwise specified. Other Home Health Orders/Instructions: - [facility] Wound Treatment Wound #1 - Gluteus Wound Laterality: Right Cleanser: Vashe, 8.5 (oz) [facility]) Every Other Day/30 Days TENILLE, MORRILL ([id number])  [phone].pdf Page 3 of 7 Discharge Instructions: Clean wound with Vashe or Anasept Peri-Wound Care: Skin Prep [facility]) Every Other Day/30 Days Discharge Instructions: Use skin prep as directed Prim Dressing: Maxorb Extra Calcium Alginate Dressing, 4x4 in [facility]) Every Other Day/30 Days ary Discharge Instructions: Apply calcium alginate to wound bed as instructed Secondary Dressing: Zetuvit Plus Silicone Border Dressing 5x5 (in/in) (Home Health) Every Other Day/30 Days Discharge Instructions: Apply silicone border over primary dressing as directed. Electronic Signature(s) Signed: 02/04/2022 1:22:55 PM By: Geralyn Corwin DO Entered By: Geralyn Corwin on 02/04/2022 11:05:04 -------------------------------------------------------------------------------- Problem List Details Patient Name: Date of Service: [name], Korryn L. 02/04/2022 10:15 A M Medical Record Number: [id number] Patient Account Number: [id number] Date of Birth/Sex: Treating RN: [birth year] (51 y.o. Debara Pickett, Yvonne Kendall Primary Care Provider: Marletta Lor Other Clinician: Referring Provider: Treating Provider/Extender: Stasia Cavalier in Treatment: 28 Active Problems ICD-10 Encounter Code Description Active Date MDM Diagnosis L89.314 Pressure ulcer of right buttock, stage 4 03/26/2021 No Yes G35 Multiple sclerosis 03/26/2021 No Yes M46.28 Osteomyelitis of vertebra, sacral and sacrococcygeal region 03/26/2021 No Yes Inactive Problems Resolved Problems Electronic Signature(s) Signed: 02/04/2022 1:22:55 PM By: Geralyn Corwin DO Entered By: Geralyn Corwin on 02/04/2022 11:02:53 -------------------------------------------------------------------------------- Progress Note Details Patient Name: Date of Service: [name], Kinsly L. 02/04/2022 10:15 A M Medical Record Number: [id number] Patient Account Number: [id number] Date of Birth/Sex: Treating RN: [birth year] (51  y.o. F) Primary Care Provider: Marletta Lor Other Clinician: [name] ([id number]) [id number] Page 4 of 7 Referring Provider: Treating Provider/Extender: Stasia Cavalier in Treatment: 45 Subjective Chief Complaint Information obtained from Patient 03/26/21; Right gluteus wound History of Present Illness (HPI) Admission 03/26/2021 [name] is a 51 year old female with a past medical history of multiple sclerosis for the past 16 years, complicated by severe debility and bedbound since 2015 that presents to the clinic for a 10-month history of ulcer to the right buttocks. She has been admitted to the hospital several times for this issue. She was last hospitalized on 02/18/2021 for septic shock due to the infected ulcer. She was  evaluated by infectious disease at that time. She completed a 2- week course of antibiotics. There is no plan for follow-up. She is currently using calcium alginate dressings. She has home health that comes out twice weekly. She currently denies signs of infection. She has an air mattress for offloading. Of note She has a history of osteomyelitis to the sacrum and treated in 2020 with IV antibiotics for 8 weeks. 3/21; patient presents for follow-up. She has been using calcium alginate to the wound bed and using Vashe to help clean prior to dressing changes. She has no issues or complaints today. 5/16; patient presents for follow-up. She has been using calcium alginate to the wound bed. She has no issues or complaints today. She is not able to aggressively offload the area due to her multiple sclerosis. 7/18; patient presents for follow-up. She had a televisit with Dr. Linus Salmons on 08/03/2021 for possible osteomyelitis noted on x-ray. She was started on doxycycline and Levaquin and completed her 4-week course. She has been using calcium alginate to the wound bed. She has no issues or complaints today. 11/27; patient  missed her last clinic appointment. She was last seen 4 months ago. She has a chronic wound to her right buttocks that has been treated for osteomyelitis. She has been using calcium alginate to the wound bed. She uses a wound cleanser in between dressing changes. She tries to aggressively offload the area. She states that due to cost she cannot come in frequently. Patient History Information obtained from Patient. Family History Unknown History. Social History Former smoker, Marital Status - Single, Alcohol Use - Never, Drug Use - No History, Caffeine Use - Rarely. Medical History Cardiovascular Patient has history of Hypertension Denies history of Deep Vein Thrombosis Integumentary (Skin) Denies history of History of Burn Musculoskeletal Patient has history of Osteomyelitis - right ischial tuberosity Neurologic Patient has history of Paraplegia Medical A Surgical History Notes nd Neurologic Multiple Sclerosis Objective Constitutional respirations regular, non-labored and within target range for patient.. Vitals Time Taken: 10:30 AM, Height: 62 in, Weight: 145 lbs, BMI: 26.5, Temperature: 98.2 F, Pulse: 87 bpm, Respiratory Rate: 18 breaths/min, Blood Pressure: 128/83 mmHg. Psychiatric pleasant and cooperative. General Notes: Right buttocks: Open wound with granulation tissue and undermining. This overlies very closely to bone. No bone exposed. Surrounding skin intact. No rashes. No signs of surrounding soft tissue infection. Integumentary (Hair, Skin) Wound #1 status is Open. Original cause of wound was Pressure Injury. The date acquired was: 12/09/2020. The wound has been in treatment 45 weeks. The wound is located on the Right Gluteus. The wound measures 3cm length x 5cm width x 3cm depth; 11.781cm^2 area and 35.343cm^3 volume. There is Fat Layer (Subcutaneous Tissue) exposed. There is no tunneling noted, however, there is undermining starting at 11:00 and ending at 2:00 with a  maximum distance of 1.8cm. There is a medium amount of serosanguineous drainage noted. The wound margin is well defined and not attached to the wound base. There is large (67-100%) red, pink, friable granulation within the wound bed. There is no necrotic tissue within the wound bed. The periwound skin appearance did not exhibit: LYNNIX, DONARSKI ([id number]) [phone].pdf Page 5 of 7 Callus, Crepitus, Excoriation, Induration, Rash, Scarring, Dry/Scaly, Maceration, Atrophie Blanche, Cyanosis, Ecchymosis, Hemosiderin Staining, Mottled, Pallor, Rubor, Erythema. Assessment Active Problems ICD-10 Pressure ulcer of right buttock, stage 4 Multiple sclerosis Osteomyelitis of vertebra, sacral and sacrococcygeal region Patient's wound is stable. She follows in our clinic every 2 months for her  chronic osteomyelitis of the right buttocks. No signs of infection today. She completed her antibiotic course per infectious disease. For now recommended continuing calcium alginate with aggressive offloading. Since cost is high for the patient to follow-up in our clinic I recommended she talk to her primary care physician to see if they can continue home health orders. 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 She will let us know if she can transition care. We are happy to see her as needed. For now I will see her back in 3 months. She knows to call with any questions or concerns. Plan Follow-up Appointments: Return Appointment in: - 3 months follow up with Dr. Heber Avery ******STRETCHER ROOM***** patient may speak with primary to continue orders for wound care and home health due to cost of wound care trips for appts. Bathing/ Shower/ Hygiene: May shower with protection but do not get wound dressing(s) wet. Off-Loading: Low air-loss mattress (Group 2) Turn and reposition every 2 hours Home Health: No change in wound care orders this week; continue Home Health for  wound care. May utilize formulary equivalent dressing for wound treatment orders unless otherwise specified. Other Home Health Orders/Instructions: - [facility] WOUND #1: - Gluteus Wound Laterality: Right Cleanser: Vashe, 8.5 (oz) (Home Health) Every Other Day/30 Days Discharge Instructions: Clean wound with Vashe or Anasept Peri-Wound Care: Skin Prep (Home Health) Every Other Day/30 Days Discharge Instructions: Use skin prep as directed Prim Dressing: Maxorb Extra Calcium Alginate Dressing, 4x4 in [facility]) Every Other Day/30 Days ary Discharge Instructions: Apply calcium alginate to wound bed as instructed Secondary Dressing: Zetuvit Plus Silicone Border Dressing 5x5 (in/in) (Home Health) Every Other Day/30 Days Discharge Instructions: Apply silicone border over primary dressing as directed. 1. Calcium alginate 2. Vashe wound cleanser 3. Aggressive offloading 4. Follow-up in 3 months Electronic Signature(s) Signed: 02/04/2022 1:22:55 PM By: Kalman Shan DO Entered By: Kalman Shan on 02/04/2022 11:07:20 -------------------------------------------------------------------------------- HxROS Details Patient Name: Date of Service: [name], Docia L. 02/04/2022 10:15 A M Medical Record Number: [id number] Patient Account Number: [id number] Date of Birth/Sex: Treating RN: 30-Dec-1970 (51 y.o. F) Primary Care Provider: Alvester Chou Other Clinician: Referring Provider: Treating Provider/Extender: Haydee Salter in Treatment: [place] From Patient ZANOBIA, SMYSER ([id number]) [id number] Page 6 of 7 Cardiovascular Medical History: Positive for: Hypertension Negative for: Deep Vein Thrombosis Integumentary (Skin) Medical History: Negative for: History of Burn Musculoskeletal Medical History: Positive for: Osteomyelitis - right ischial tuberosity Neurologic Medical History: Positive for: Paraplegia Past  Medical History Notes: Multiple Sclerosis Immunizations Pneumococcal Vaccine: Received Pneumococcal Vaccination: No Implantable Devices Yes Family and Social History Unknown History: Yes; Former smoker; Marital Status - Single; Alcohol Use: Never; Drug Use: No History; Caffeine Use: Rarely; Financial Concerns: No; Food, Clothing or Shelter Needs: No; Support System Lacking: No; Transportation Concerns: No Electronic Signature(s) Signed: 02/04/2022 1:22:55 PM By: Kalman Shan DO Entered By: Kalman Shan on 02/04/2022 11:04:35 -------------------------------------------------------------------------------- SuperBill Details Patient Name: Date of Service: [name], Ailyne L. 02/04/2022 Medical Record Number: [id number] Patient Account Number: [id number] Date of Birth/Sex: Treating RN: Feb 12, 1971 (51 y.o. F) Primary Care Provider: Alvester Chou Other Clinician: Referring Provider: Treating Provider/Extender: Haydee Salter in Treatment: 54 Diagnosis Coding ICD-10 Codes Code Description L89.314 Pressure ulcer of right buttock, stage 4 G35 Multiple sclerosis M46.28 Osteomyelitis of vertebra, sacral and sacrococcygeal region Physician Procedures : CPT4 Code Description Modifier [id number] - WC PHYS LEVEL 3 - EST PT ICD-10  Diagnosis Description L89.314 Pressure ulcer of right buttock, stage 4 G35 Multiple sclerosis M46.28 Osteomyelitis of vertebra, sacral and sacrococcygeal region MALLORY, DOETSCH L ([id number]) [phone].pdf P Quantity: 1 age 69 of 5 Electronic Signature(s) Signed: 02/04/2022 1:22:55 PM By: Kalman Shan DO Entered By: Kalman Shan on 02/04/2022 11:07:36

## 2022-02-05 DIAGNOSIS — N200 Calculus of kidney: Secondary | ICD-10-CM | POA: Diagnosis not present

## 2022-02-05 DIAGNOSIS — G8220 Paraplegia, unspecified: Secondary | ICD-10-CM | POA: Diagnosis not present

## 2022-02-05 DIAGNOSIS — E440 Moderate protein-calorie malnutrition: Secondary | ICD-10-CM | POA: Diagnosis not present

## 2022-02-05 DIAGNOSIS — G8929 Other chronic pain: Secondary | ICD-10-CM | POA: Diagnosis not present

## 2022-02-05 DIAGNOSIS — L89314 Pressure ulcer of right buttock, stage 4: Secondary | ICD-10-CM | POA: Diagnosis not present

## 2022-02-05 DIAGNOSIS — G35 Multiple sclerosis: Secondary | ICD-10-CM | POA: Diagnosis not present

## 2022-02-05 DIAGNOSIS — N8184 Pelvic muscle wasting: Secondary | ICD-10-CM | POA: Diagnosis not present

## 2022-02-05 DIAGNOSIS — I10 Essential (primary) hypertension: Secondary | ICD-10-CM | POA: Diagnosis not present

## 2022-02-05 DIAGNOSIS — I700 Atherosclerosis of aorta: Secondary | ICD-10-CM | POA: Diagnosis not present

## 2022-02-05 NOTE — Progress Notes (Signed)
[name] ([id number]) [phone].pdf Page 1 of 9 Visit Report for 02/04/2022 Arrival Information Details Patient Name: Date of Service: [name], [name] 02/04/2022 10:15 A M Medical Record Number: [id number] Patient Account Number: [id number] Date of Birth/Sex: Treating RN: 03-20-1970 (51 y.o. [name] Primary Care [name] Other Clinician: Referring [name]: Treating [name]/Extender: [name] in Treatment: 72 Visit Information History Since Last Visit All ordered tests and consults were completed: No Patient Arrived: Stretcher Added or deleted any medications: No Arrival Time: 10:37 Any new allergies or adverse reactions: No Transfer Assistance: Manual Had a fall or experienced change in No Patient Requires Transmission-Based Precautions: No activities of daily living that may affect Patient Has Alerts: No risk of falls: Signs or symptoms of abuse/neglect since last visito No Hospitalized since last visit: No Implantable device outside of the clinic excluding No cellular tissue based products placed in the center since last visit: Has Dressing in Place as Prescribed: Yes Pain Present Now: Yes Electronic Signature(s) Signed: 02/05/2022 5:04:15 PM By: Sharyn Creamer RN, BSN Entered By: Sharyn Creamer on 02/04/2022 10:38:58 -------------------------------------------------------------------------------- Clinic Level of Care Assessment Details Patient Name: Date of Service: [name], [name] 02/04/2022 10:15 A M Medical Record Number: [id number] Patient Account Number: [id number] Date of Birth/Sex: Treating RN: 1970-05-06 (51 y.o. [name], [name] Primary Care Kahlan Engebretson: [name] Other Clinician: Referring [name]: Treating [name]/Extender: [name] in Treatment: 7 Clinic Level of Care Assessment Items TOOL 4 Quantity Score X- 1 0 Use when only an EandM is  performed on FOLLOW-UP visit ASSESSMENTS - Nursing Assessment / Reassessment X- 1 10 Reassessment of Co-morbidities (includes updates in patient status) X- 1 5 Reassessment of Adherence to Treatment Plan ASSESSMENTS - Wound and Skin A ssessment / Reassessment _0  - 0 Simple Wound Assessment / Reassessment - one wound X- 1 5 Complex Wound Assessment / Reassessment - multiple wounds _1  - 0 Dermatologic / Skin Assessment (not related to wound area) ASSESSMENTS - Focused Assessment _2  - 0 Circumferential Edema Measurements - multi extremities _3  - 0 Nutritional Assessment / Counseling / Intervention [name] ([id number]) [id number].pdf Page 2 of 9 _4  - 0 Lower Extremity Assessment (monofilament, tuning fork, pulses) _5  - 0 Peripheral Arterial Disease Assessment (using hand held doppler) ASSESSMENTS - Ostomy and/or Continence Assessment and Care _6  - 0 Incontinence Assessment and Management _7  - 0 Ostomy Care Assessment and Management (repouching, etc.) PROCESS - Coordination of Care _8  - 0 Simple Patient / Family Education for ongoing care X- 1 20 Complex (extensive) Patient / Family Education for ongoing care X- 1 10 Staff obtains Programmer, systems, Records, T Results / Process Orders est X- 1 10 Staff telephones HHA, Nursing Homes / Clarify orders / etc _9  - 0 Routine Transfer to another Facility (non-emergent condition) _10  - 0 Routine Hospital Admission (non-emergent condition) _11  - 0 New Admissions / Biomedical engineer / Ordering NPWT Apligraf, etc. , _12  - 0 Emergency Hospital Admission (emergent condition) _13  - 0 Simple Discharge Coordination X- 1 15 Complex (extensive) Discharge Coordination PROCESS - Special Needs _14  - 0 Pediatric / Minor Patient Management _15  - 0 Isolation Patient Management _16  - 0 Hearing / Language / Visual special needs _17  - 0 Assessment of Community assistance (transportation, D/C planning, etc.) _18  -  0 Additional assistance / Altered mentation _19  - 0 Support Surface(s) Assessment (bed, cushion, seat, etc.) INTERVENTIONS - Wound Cleansing / Measurement _20  - 0 Simple Wound Cleansing -  one wound X- 1 5 Complex Wound Cleansing - multiple wounds X- 1 5 Wound Imaging (photographs - any number of wounds) _0  - 0 Wound Tracing (instead of photographs) _1  - 0 Simple Wound Measurement - one wound X- 1 5 Complex Wound Measurement - multiple wounds INTERVENTIONS - Wound Dressings _2  - 0 Small Wound Dressing one or multiple wounds X- 1 15 Medium Wound Dressing one or multiple wounds _3  - 0 Large Wound Dressing one or multiple wounds <ZMOQHUTMLYYTKPTW>_6<\/FKCLEXNTZGYFVCBS>_4  - 0 Application of Medications - topical <HQPRFFMBWGYKZLDJ>_5<\/TSVXBLTJQZESPQZR>_0  - 0 Application of Medications - injection INTERVENTIONS - Miscellaneous _6  - 0 External ear exam _7  - 0 Specimen Collection (cultures, biopsies, blood, body fluids, etc.) _8  - 0 Specimen(s) / Culture(s) sent or taken to Lab for analysis X- 1 10 Patient Transfer (multiple staff / Civil Service fast streamer / Similar devices) _9  - 0 Simple Staple / Suture removal (25 or less) _10  - 0 Complex Staple / Suture removal (26 or more) _11  - 0 Hypo / Hyperglycemic Management (close monitor of Blood Glucose) Hoeppner, Suzanne L ([id number]) [id number] Page 3 of 9 _12  - 0 Ankle / Brachial Index (ABI) - do not check if billed separately X- 1 5 Vital Signs Has the patient been seen at the hospital within the last three years: Yes Total Score: 120 Level Of Care: New/Established - Level 4 Electronic Signature(s) Signed: 02/04/2022 6:45:57 PM By: Deon Pilling RN, BSN Entered By: Deon Pilling on 02/04/2022 17:43:58 -------------------------------------------------------------------------------- Encounter Discharge Information Details Patient Name: Date of Service: [name], [name] 02/04/2022 10:15 A M Medical Record Number: [id number] Patient Account Number: [id number] Date of Birth/Sex: Treating  RN: 10-17-1970 (51 y.o. Debby Bud Primary Care Latesha Chesney: [name] Other Clinician: Referring Meral Geissinger: Treating Radie Berges/Extender: [name] in Treatment: 38 Encounter Discharge Information Items Discharge Condition: Stable Ambulatory Status: Stretcher Discharge Destination: Home Transportation: Private Auto Accompanied By: EMS crew Schedule Follow-up Appointment: Yes Clinical Summary of Care: Electronic Signature(s) Signed: 02/04/2022 6:45:57 PM By: Deon Pilling RN, BSN Entered By: Deon Pilling on 02/04/2022 17:45:38 -------------------------------------------------------------------------------- Lower Extremity Assessment Details Patient Name: Date of Service: [name], [name] 02/04/2022 10:15 A M Medical Record Number: [id number] Patient Account Number: [id number] Date of Birth/Sex: Treating RN: 16-Oct-1970 (51 y.o. [name] Primary Care Arlie Posch: [name] Other Clinician: Referring Jameriah Trotti: Treating Osmani Kersten/Extender: [name] in Treatment: 45 Electronic Signature(s) Signed: 02/05/2022 5:04:15 PM By: Sharyn Creamer RN, BSN Entered By: Sharyn Creamer on 02/04/2022 10:44:48 -------------------------------------------------------------------------------- Multi Wound Chart Details Patient Name: Date of Service: [name], [name] 02/04/2022 10:15 A Trevor Iha, Leeann Must ([id number]) [id number] Page 4 of 9 Medical Record Number: [id number] Patient Account Number: [id number] Date of Birth/Sex: Treating RN: 19-May-1970 (51 y.o. F) Primary Care Mael Delap: [name] Other Clinician: Referring Letty Salvi: Treating Makaiah Terwilliger/Extender: [name] in Treatment: 45 Vital Signs Height(in): 62 Pulse(bpm): 87 Weight(lbs): 145 Blood Pressure(mmHg): 128/83 Body Mass Index(BMI): 26.5 Temperature(F): 98.2 Respiratory Rate(breaths/min): 18 [1:Photos:]  [N/A:N/A] Right Gluteus N/A N/A Wound Location: Pressure Injury N/A N/A Wounding Event: Pressure Ulcer N/A N/A Primary Etiology: Hypertension, Osteomyelitis, N/A N/A Comorbid History: Paraplegia 12/09/2020 N/A N/A Date Acquired: 73 N/A N/A Weeks of Treatment: Open N/A N/A Wound Status: No N/A N/A Wound Recurrence: 3x5x3 N/A N/A Measurements L x W x D (cm) 11.781 N/A N/A A (cm) : rea 35.343 N/A N/A Volume (cm) : 33.30% N/A N/A % Reduction in A rea: 58.30% N/A N/A % Reduction in Volume: 11  Starting Position 1 (o'clock): 2 Ending Position 1 (o'clock): 1.8 Maximum Distance 1 (cm): Yes N/A N/A Undermining: Category/Stage IV N/A N/A Classification: Medium N/A N/A Exudate A mount: Serosanguineous N/A N/A Exudate Type: red, brown N/A N/A Exudate Color: Well defined, not attached N/A N/A Wound Margin: Large (67-100%) N/A N/A Granulation A mount: Red, Pink, Friable N/A N/A Granulation Quality: None Present (0%) N/A N/A Necrotic A mount: Fat Layer (Subcutaneous Tissue): Yes N/A N/A Exposed Structures: Fascia: No Tendon: No Muscle: No Joint: No Bone: No None N/A N/A Epithelialization: Excoriation: No N/A N/A Periwound Skin Texture: Induration: No Callus: No Crepitus: No Rash: No Scarring: No Maceration: No N/A N/A Periwound Skin Moisture: Dry/Scaly: No Atrophie Blanche: No N/A N/A Periwound Skin Color: Cyanosis: No Ecchymosis: No Erythema: No Hemosiderin Staining: No Mottled: No Pallor: No Rubor: No Treatment Notes Electronic Signature(s) Signed: 02/04/2022 1:22:55 PM By: Kalman Shan DO Entered By: Kalman Shan on 02/04/2022 11:03:01 [name] ([id number]) [id number] Page 5 of 9 -------------------------------------------------------------------------------- Multi-Disciplinary Care Plan Details Patient Name: Date of Service: [name], [name] 02/04/2022 10:15 A M Medical Record Number:  [id number] Patient Account Number: [id number] Date of Birth/Sex: Treating RN: [birth year] (51 y.o. [name], Tammi Klippel Primary Care Autrey Human: [name] Other Clinician: Referring Ashyia Schraeder: Treating Maricel Swartzendruber/Extender: [name] in Treatment: [place] reviewed with physician Active Inactive Nutrition Nursing Diagnoses: Potential for alteratiion in Nutrition/Potential for imbalanced nutrition Goals: Patient/caregiver agrees to and verbalizes understanding of need to use nutritional supplements and/or vitamins as prescribed Date Initiated: 03/26/2021 Target Resolution Date: 05/02/2022 Goal Status: Active Interventions: Assess patient nutrition upon admission and as needed per policy Provide education on nutrition Treatment Activities: Education provided on Nutrition : 09/25/2021 Notes: Pressure Nursing Diagnoses: Knowledge deficit related to causes and risk factors for pressure ulcer development Knowledge deficit related to management of pressures ulcers Potential for impaired tissue integrity related to pressure, friction, moisture, and shear Goals: Patient/caregiver will verbalize risk factors for pressure ulcer development Date Initiated: 03/26/2021 Target Resolution Date: 03/07/2022 Goal Status: Active Patient/caregiver will verbalize understanding of pressure ulcer management Date Initiated: 03/26/2021 Date Inactivated: 02/04/2022 Target Resolution Date: 02/04/2022 Goal Status: Met Interventions: Assess: immobility, friction, shearing, incontinence upon admission and as needed Assess offloading mechanisms upon admission and as needed Assess potential for pressure ulcer upon admission and as needed Provide education on pressure ulcers Notes: 09/25/21: Uses air mattress, pressure relief ongoing. Wound/Skin Impairment Nursing Diagnoses: Impaired tissue integrity Knowledge deficit related to ulceration/compromised skin  integrity Goals: Patient/caregiver will verbalize understanding of skin care regimen Date Initiated: 03/26/2021 Target Resolution Date: 11/27/2021 Goal Status: Active Ulcer/skin breakdown will have a volume reduction of 30% by week 4 Geeslin, Peta L ([id number]) [id number] Page 6 of 9 Date Initiated: 03/26/2021 Date Inactivated: 07/24/2021 Target Resolution Date: 04/27/2021 Goal Status: Met Interventions: Assess patient/caregiver ability to obtain necessary supplies Assess patient/caregiver ability to perform ulcer/skin care regimen upon admission and as needed Assess ulceration(s) every visit Provide education on ulcer and skin care Notes: 07/24/21: Pressure relief ongoing. Patient has low loss air mattress. 09/25/21: Wound care regimen continues, has HH assisting with dressing changes. Electronic Signature(s) Signed: 02/04/2022 6:45:57 PM By: Deon Pilling RN, BSN Entered By: Deon Pilling on 02/04/2022 10:46:14 -------------------------------------------------------------------------------- Pain Assessment Details Patient Name: Date of Service: [name], [name] 02/04/2022 10:15 A M Medical Record Number: [id number] Patient Account Number: [id number] Date of Birth/Sex: Treating RN: 01/10/1971 (51 y.o. [name] Primary Care Anina Schnake: [name] Other Clinician:  Referring Sesar Madewell: Treating Rokhaya Quinn/Extender: [name] in Treatment: 45 Active Problems Location of Pain Severity and Description of Pain Patient Has Paino Yes Site Locations Rate the pain. Current Pain Level: 5 Pain Management and Medication Current Pain Management: Electronic Signature(s) Signed: 02/05/2022 5:04:15 PM By: Sharyn Creamer RN, BSN Entered By: Sharyn Creamer on 02/04/2022 10:44:41 Patient/Caregiver Education Details -------------------------------------------------------------------------------- [name] ([id number])  [id number] Page 7 of 9 Patient Name: Date of Service: [name], [name] 11/27/2023andnbsp10:15 A M Medical Record Number: [id number] Patient Account Number: [id number] Date of Birth/Gender: Treating RN: Oct 11, 1970 (51 y.o. Debby Bud Primary Care Physician: [name] Other Clinician: Referring Physician: Treating Physician/Extender: [name] in Treatment: 33 Education Assessment Education Provided To: Patient Education Topics Provided Wound/Skin Impairment: Handouts: Skin Care Do's and Dont's Methods: Explain/Verbal Responses: Reinforcements needed Electronic Signature(s) Signed: 02/04/2022 6:45:57 PM By: Deon Pilling RN, BSN Entered By: Deon Pilling on 02/04/2022 10:46:30 -------------------------------------------------------------------------------- Wound Assessment Details Patient Name: Date of Service: [name], [name] 02/04/2022 10:15 A M Medical Record Number: [id number] Patient Account Number: [id number] Date of Birth/Sex: Treating RN: 02-27-1971 (51 y.o. [name], [name] Primary Care Aniayah Alaniz: [name] Other Clinician: Referring Amelia Burgard: Treating Ilka Lovick/Extender: [name] in Treatment: 23 Wound Status Wound Number: 1 Primary Etiology: Pressure Ulcer Wound Location: Right Gluteus Wound Status: Open Wounding Event: Pressure Injury Comorbid History: Hypertension, Osteomyelitis, Paraplegia Date Acquired: 12/09/2020 Weeks Of Treatment: 45 Clustered Wound: No Photos Wound Measurements Length: (cm) 3 Width: (cm) 5 Depth: (cm) 3 Area: (cm) 11.781 Volume: (cm) 35.343 Authier, Jerrika L ([id number]) Wound Description Classification: Category/Stage IV Wound Margin: Well defined, not attached Exudate Amount: Medium Exudate Type: Serosanguineous Exudate Color: red, brown Foul Odor After Cleansing: Slough/Fibrino % Reduction in Area: 33.3% % Reduction in Volume:  58.3% Epithelialization: None Tunneling: No Undermining: Yes Starting Position (o'clock): 11 Ending Position (o'clock): 2 Maximum Distance: (cm) 1.8 [id number] Page 8 of 9 No No Wound Bed Granulation Amount: Large (67-100%) Exposed Structure Granulation Quality: Red, Pink, Friable Fascia Exposed: No Necrotic Amount: None Present (0%) Fat Layer (Subcutaneous Tissue) Exposed: Yes Tendon Exposed: No Muscle Exposed: No Joint Exposed: No Bone Exposed: No Periwound Skin Texture Texture Color No Abnormalities Noted: No No Abnormalities Noted: No Callus: No Atrophie Blanche: No Crepitus: No Cyanosis: No Excoriation: No Ecchymosis: No Induration: No Erythema: No Rash: No Hemosiderin Staining: No Scarring: No Mottled: No Pallor: No Moisture Rubor: No No Abnormalities Noted: No Dry / Scaly: No Maceration: No Treatment Notes Wound #1 (Gluteus) Wound Laterality: Right Cleanser Vashe, 8.5 (oz) Discharge Instruction: Clean wound with Vashe or Anasept Peri-Wound Care Skin Prep Discharge Instruction: Use skin prep as directed Topical Primary Dressing Maxorb Extra Calcium Alginate Dressing, 4x4 in Discharge Instruction: Apply calcium alginate to wound bed as instructed Secondary Dressing Zetuvit Plus Silicone Border Dressing 5x5 (in/in) Discharge Instruction: Apply silicone border over primary dressing as directed. Secured With Compression Wrap Compression Stockings Environmental education officer) Signed: 02/04/2022 6:45:57 PM By: Deon Pilling RN, BSN Entered By: Deon Pilling on 02/04/2022 10:45:20 -------------------------------------------------------------------------------- Vitals Details Patient Name: Date of Service: [name], Eniya L. 02/04/2022 10:15 A Trevor Iha, Leeann Must ([id number]) [id number] Page 9 of 9 Medical Record Number: [id number] Patient Account Number: [id number] Date of Birth/Sex: Treating  RN: 02-24-1971 (51 y.o. [name] Primary Care Haytham Maher: [name] Other Clinician: Referring Aarian Griffie: Treating Myson Levi/Extender: [name] in Treatment: 45 Vital Signs Time Taken: 10:30 Temperature (F):  98.2 Height (in): 62 Pulse (bpm): 87 Weight (lbs): 145 Respiratory Rate (breaths/min): 18 Body Mass Index (BMI): 26.5 Blood Pressure (mmHg): 128/83 Reference Range: 80 - 120 mg / dl Electronic Signature(s) Signed: 02/05/2022 5:04:15 PM By: Sharyn Creamer RN, BSN Entered By: Sharyn Creamer on 02/04/2022 10:44:31

## 2022-02-08 DIAGNOSIS — G8929 Other chronic pain: Secondary | ICD-10-CM | POA: Diagnosis not present

## 2022-02-08 DIAGNOSIS — G8220 Paraplegia, unspecified: Secondary | ICD-10-CM | POA: Diagnosis not present

## 2022-02-08 DIAGNOSIS — G35 Multiple sclerosis: Secondary | ICD-10-CM | POA: Diagnosis not present

## 2022-02-08 DIAGNOSIS — I700 Atherosclerosis of aorta: Secondary | ICD-10-CM | POA: Diagnosis not present

## 2022-02-08 DIAGNOSIS — E440 Moderate protein-calorie malnutrition: Secondary | ICD-10-CM | POA: Diagnosis not present

## 2022-02-08 DIAGNOSIS — N200 Calculus of kidney: Secondary | ICD-10-CM | POA: Diagnosis not present

## 2022-02-08 DIAGNOSIS — I10 Essential (primary) hypertension: Secondary | ICD-10-CM | POA: Diagnosis not present

## 2022-02-08 DIAGNOSIS — N8184 Pelvic muscle wasting: Secondary | ICD-10-CM | POA: Diagnosis not present

## 2022-02-08 DIAGNOSIS — L89314 Pressure ulcer of right buttock, stage 4: Secondary | ICD-10-CM | POA: Diagnosis not present

## 2022-02-12 DIAGNOSIS — G8929 Other chronic pain: Secondary | ICD-10-CM | POA: Diagnosis not present

## 2022-02-12 DIAGNOSIS — E440 Moderate protein-calorie malnutrition: Secondary | ICD-10-CM | POA: Diagnosis not present

## 2022-02-12 DIAGNOSIS — G35 Multiple sclerosis: Secondary | ICD-10-CM | POA: Diagnosis not present

## 2022-02-12 DIAGNOSIS — N200 Calculus of kidney: Secondary | ICD-10-CM | POA: Diagnosis not present

## 2022-02-12 DIAGNOSIS — I10 Essential (primary) hypertension: Secondary | ICD-10-CM | POA: Diagnosis not present

## 2022-02-12 DIAGNOSIS — L89314 Pressure ulcer of right buttock, stage 4: Secondary | ICD-10-CM | POA: Diagnosis not present

## 2022-02-12 DIAGNOSIS — I700 Atherosclerosis of aorta: Secondary | ICD-10-CM | POA: Diagnosis not present

## 2022-02-12 DIAGNOSIS — N8184 Pelvic muscle wasting: Secondary | ICD-10-CM | POA: Diagnosis not present

## 2022-02-12 DIAGNOSIS — G8220 Paraplegia, unspecified: Secondary | ICD-10-CM | POA: Diagnosis not present

## 2022-02-15 DIAGNOSIS — E440 Moderate protein-calorie malnutrition: Secondary | ICD-10-CM | POA: Diagnosis not present

## 2022-02-15 DIAGNOSIS — I10 Essential (primary) hypertension: Secondary | ICD-10-CM | POA: Diagnosis not present

## 2022-02-15 DIAGNOSIS — L89314 Pressure ulcer of right buttock, stage 4: Secondary | ICD-10-CM | POA: Diagnosis not present

## 2022-02-15 DIAGNOSIS — G8929 Other chronic pain: Secondary | ICD-10-CM | POA: Diagnosis not present

## 2022-02-15 DIAGNOSIS — I700 Atherosclerosis of aorta: Secondary | ICD-10-CM | POA: Diagnosis not present

## 2022-02-15 DIAGNOSIS — G8220 Paraplegia, unspecified: Secondary | ICD-10-CM | POA: Diagnosis not present

## 2022-02-15 DIAGNOSIS — N200 Calculus of kidney: Secondary | ICD-10-CM | POA: Diagnosis not present

## 2022-02-15 DIAGNOSIS — N8184 Pelvic muscle wasting: Secondary | ICD-10-CM | POA: Diagnosis not present

## 2022-02-15 DIAGNOSIS — G35 Multiple sclerosis: Secondary | ICD-10-CM | POA: Diagnosis not present

## 2022-02-19 DIAGNOSIS — G8220 Paraplegia, unspecified: Secondary | ICD-10-CM | POA: Diagnosis not present

## 2022-02-19 DIAGNOSIS — N200 Calculus of kidney: Secondary | ICD-10-CM | POA: Diagnosis not present

## 2022-02-19 DIAGNOSIS — G8929 Other chronic pain: Secondary | ICD-10-CM | POA: Diagnosis not present

## 2022-02-19 DIAGNOSIS — I10 Essential (primary) hypertension: Secondary | ICD-10-CM | POA: Diagnosis not present

## 2022-02-19 DIAGNOSIS — N8184 Pelvic muscle wasting: Secondary | ICD-10-CM | POA: Diagnosis not present

## 2022-02-19 DIAGNOSIS — L89314 Pressure ulcer of right buttock, stage 4: Secondary | ICD-10-CM | POA: Diagnosis not present

## 2022-02-19 DIAGNOSIS — I700 Atherosclerosis of aorta: Secondary | ICD-10-CM | POA: Diagnosis not present

## 2022-02-19 DIAGNOSIS — E440 Moderate protein-calorie malnutrition: Secondary | ICD-10-CM | POA: Diagnosis not present

## 2022-02-19 DIAGNOSIS — G35 Multiple sclerosis: Secondary | ICD-10-CM | POA: Diagnosis not present

## 2022-02-22 DIAGNOSIS — I700 Atherosclerosis of aorta: Secondary | ICD-10-CM | POA: Diagnosis not present

## 2022-02-22 DIAGNOSIS — I10 Essential (primary) hypertension: Secondary | ICD-10-CM | POA: Diagnosis not present

## 2022-02-22 DIAGNOSIS — E440 Moderate protein-calorie malnutrition: Secondary | ICD-10-CM | POA: Diagnosis not present

## 2022-02-22 DIAGNOSIS — L89314 Pressure ulcer of right buttock, stage 4: Secondary | ICD-10-CM | POA: Diagnosis not present

## 2022-02-22 DIAGNOSIS — N200 Calculus of kidney: Secondary | ICD-10-CM | POA: Diagnosis not present

## 2022-02-22 DIAGNOSIS — G35 Multiple sclerosis: Secondary | ICD-10-CM | POA: Diagnosis not present

## 2022-02-22 DIAGNOSIS — G8929 Other chronic pain: Secondary | ICD-10-CM | POA: Diagnosis not present

## 2022-02-22 DIAGNOSIS — G8220 Paraplegia, unspecified: Secondary | ICD-10-CM | POA: Diagnosis not present

## 2022-02-22 DIAGNOSIS — N8184 Pelvic muscle wasting: Secondary | ICD-10-CM | POA: Diagnosis not present

## 2022-02-25 DIAGNOSIS — I700 Atherosclerosis of aorta: Secondary | ICD-10-CM | POA: Diagnosis not present

## 2022-02-25 DIAGNOSIS — E440 Moderate protein-calorie malnutrition: Secondary | ICD-10-CM | POA: Diagnosis not present

## 2022-02-25 DIAGNOSIS — G8929 Other chronic pain: Secondary | ICD-10-CM | POA: Diagnosis not present

## 2022-02-25 DIAGNOSIS — G8220 Paraplegia, unspecified: Secondary | ICD-10-CM | POA: Diagnosis not present

## 2022-02-25 DIAGNOSIS — G35 Multiple sclerosis: Secondary | ICD-10-CM | POA: Diagnosis not present

## 2022-02-25 DIAGNOSIS — L89314 Pressure ulcer of right buttock, stage 4: Secondary | ICD-10-CM | POA: Diagnosis not present

## 2022-02-25 DIAGNOSIS — N8184 Pelvic muscle wasting: Secondary | ICD-10-CM | POA: Diagnosis not present

## 2022-02-25 DIAGNOSIS — N200 Calculus of kidney: Secondary | ICD-10-CM | POA: Diagnosis not present

## 2022-02-25 DIAGNOSIS — I10 Essential (primary) hypertension: Secondary | ICD-10-CM | POA: Diagnosis not present

## 2022-03-01 DIAGNOSIS — G8929 Other chronic pain: Secondary | ICD-10-CM | POA: Diagnosis not present

## 2022-03-01 DIAGNOSIS — I10 Essential (primary) hypertension: Secondary | ICD-10-CM | POA: Diagnosis not present

## 2022-03-01 DIAGNOSIS — E440 Moderate protein-calorie malnutrition: Secondary | ICD-10-CM | POA: Diagnosis not present

## 2022-03-01 DIAGNOSIS — G35 Multiple sclerosis: Secondary | ICD-10-CM | POA: Diagnosis not present

## 2022-03-01 DIAGNOSIS — L89314 Pressure ulcer of right buttock, stage 4: Secondary | ICD-10-CM | POA: Diagnosis not present

## 2022-03-01 DIAGNOSIS — N200 Calculus of kidney: Secondary | ICD-10-CM | POA: Diagnosis not present

## 2022-03-01 DIAGNOSIS — I700 Atherosclerosis of aorta: Secondary | ICD-10-CM | POA: Diagnosis not present

## 2022-03-01 DIAGNOSIS — G8220 Paraplegia, unspecified: Secondary | ICD-10-CM | POA: Diagnosis not present

## 2022-03-01 DIAGNOSIS — N8184 Pelvic muscle wasting: Secondary | ICD-10-CM | POA: Diagnosis not present

## 2022-03-06 DIAGNOSIS — N200 Calculus of kidney: Secondary | ICD-10-CM | POA: Diagnosis not present

## 2022-03-06 DIAGNOSIS — G35 Multiple sclerosis: Secondary | ICD-10-CM | POA: Diagnosis not present

## 2022-03-06 DIAGNOSIS — I10 Essential (primary) hypertension: Secondary | ICD-10-CM | POA: Diagnosis not present

## 2022-03-06 DIAGNOSIS — G8929 Other chronic pain: Secondary | ICD-10-CM | POA: Diagnosis not present

## 2022-03-06 DIAGNOSIS — G8220 Paraplegia, unspecified: Secondary | ICD-10-CM | POA: Diagnosis not present

## 2022-03-06 DIAGNOSIS — N8184 Pelvic muscle wasting: Secondary | ICD-10-CM | POA: Diagnosis not present

## 2022-03-06 DIAGNOSIS — E440 Moderate protein-calorie malnutrition: Secondary | ICD-10-CM | POA: Diagnosis not present

## 2022-03-06 DIAGNOSIS — I700 Atherosclerosis of aorta: Secondary | ICD-10-CM | POA: Diagnosis not present

## 2022-03-06 DIAGNOSIS — L89314 Pressure ulcer of right buttock, stage 4: Secondary | ICD-10-CM | POA: Diagnosis not present

## 2022-03-08 DIAGNOSIS — I700 Atherosclerosis of aorta: Secondary | ICD-10-CM | POA: Diagnosis not present

## 2022-03-08 DIAGNOSIS — G35 Multiple sclerosis: Secondary | ICD-10-CM | POA: Diagnosis not present

## 2022-03-08 DIAGNOSIS — L89314 Pressure ulcer of right buttock, stage 4: Secondary | ICD-10-CM | POA: Diagnosis not present

## 2022-03-08 DIAGNOSIS — G8929 Other chronic pain: Secondary | ICD-10-CM | POA: Diagnosis not present

## 2022-03-08 DIAGNOSIS — N8184 Pelvic muscle wasting: Secondary | ICD-10-CM | POA: Diagnosis not present

## 2022-03-08 DIAGNOSIS — G8220 Paraplegia, unspecified: Secondary | ICD-10-CM | POA: Diagnosis not present

## 2022-03-08 DIAGNOSIS — N200 Calculus of kidney: Secondary | ICD-10-CM | POA: Diagnosis not present

## 2022-03-08 DIAGNOSIS — E440 Moderate protein-calorie malnutrition: Secondary | ICD-10-CM | POA: Diagnosis not present

## 2022-03-08 DIAGNOSIS — I10 Essential (primary) hypertension: Secondary | ICD-10-CM | POA: Diagnosis not present

## 2022-03-12 DIAGNOSIS — I700 Atherosclerosis of aorta: Secondary | ICD-10-CM | POA: Diagnosis not present

## 2022-03-12 DIAGNOSIS — G35 Multiple sclerosis: Secondary | ICD-10-CM | POA: Diagnosis not present

## 2022-03-12 DIAGNOSIS — E440 Moderate protein-calorie malnutrition: Secondary | ICD-10-CM | POA: Diagnosis not present

## 2022-03-12 DIAGNOSIS — L89314 Pressure ulcer of right buttock, stage 4: Secondary | ICD-10-CM | POA: Diagnosis not present

## 2022-03-12 DIAGNOSIS — N8184 Pelvic muscle wasting: Secondary | ICD-10-CM | POA: Diagnosis not present

## 2022-03-12 DIAGNOSIS — G8220 Paraplegia, unspecified: Secondary | ICD-10-CM | POA: Diagnosis not present

## 2022-03-12 DIAGNOSIS — N200 Calculus of kidney: Secondary | ICD-10-CM | POA: Diagnosis not present

## 2022-03-12 DIAGNOSIS — G8929 Other chronic pain: Secondary | ICD-10-CM | POA: Diagnosis not present

## 2022-03-12 DIAGNOSIS — I10 Essential (primary) hypertension: Secondary | ICD-10-CM | POA: Diagnosis not present

## 2022-03-15 DIAGNOSIS — I10 Essential (primary) hypertension: Secondary | ICD-10-CM | POA: Diagnosis not present

## 2022-03-15 DIAGNOSIS — I700 Atherosclerosis of aorta: Secondary | ICD-10-CM | POA: Diagnosis not present

## 2022-03-15 DIAGNOSIS — E440 Moderate protein-calorie malnutrition: Secondary | ICD-10-CM | POA: Diagnosis not present

## 2022-03-15 DIAGNOSIS — G8929 Other chronic pain: Secondary | ICD-10-CM | POA: Diagnosis not present

## 2022-03-15 DIAGNOSIS — N200 Calculus of kidney: Secondary | ICD-10-CM | POA: Diagnosis not present

## 2022-03-15 DIAGNOSIS — G35 Multiple sclerosis: Secondary | ICD-10-CM | POA: Diagnosis not present

## 2022-03-15 DIAGNOSIS — L89314 Pressure ulcer of right buttock, stage 4: Secondary | ICD-10-CM | POA: Diagnosis not present

## 2022-03-15 DIAGNOSIS — N8184 Pelvic muscle wasting: Secondary | ICD-10-CM | POA: Diagnosis not present

## 2022-03-15 DIAGNOSIS — G8220 Paraplegia, unspecified: Secondary | ICD-10-CM | POA: Diagnosis not present

## 2022-03-19 DIAGNOSIS — G35 Multiple sclerosis: Secondary | ICD-10-CM | POA: Diagnosis not present

## 2022-03-19 DIAGNOSIS — L89314 Pressure ulcer of right buttock, stage 4: Secondary | ICD-10-CM | POA: Diagnosis not present

## 2022-03-19 DIAGNOSIS — N200 Calculus of kidney: Secondary | ICD-10-CM | POA: Diagnosis not present

## 2022-03-19 DIAGNOSIS — I700 Atherosclerosis of aorta: Secondary | ICD-10-CM | POA: Diagnosis not present

## 2022-03-19 DIAGNOSIS — I10 Essential (primary) hypertension: Secondary | ICD-10-CM | POA: Diagnosis not present

## 2022-03-19 DIAGNOSIS — G8220 Paraplegia, unspecified: Secondary | ICD-10-CM | POA: Diagnosis not present

## 2022-03-19 DIAGNOSIS — E440 Moderate protein-calorie malnutrition: Secondary | ICD-10-CM | POA: Diagnosis not present

## 2022-03-19 DIAGNOSIS — G8929 Other chronic pain: Secondary | ICD-10-CM | POA: Diagnosis not present

## 2022-03-19 DIAGNOSIS — N8184 Pelvic muscle wasting: Secondary | ICD-10-CM | POA: Diagnosis not present

## 2022-03-22 DIAGNOSIS — G8220 Paraplegia, unspecified: Secondary | ICD-10-CM | POA: Diagnosis not present

## 2022-03-22 DIAGNOSIS — E440 Moderate protein-calorie malnutrition: Secondary | ICD-10-CM | POA: Diagnosis not present

## 2022-03-22 DIAGNOSIS — N200 Calculus of kidney: Secondary | ICD-10-CM | POA: Diagnosis not present

## 2022-03-22 DIAGNOSIS — L89314 Pressure ulcer of right buttock, stage 4: Secondary | ICD-10-CM | POA: Diagnosis not present

## 2022-03-22 DIAGNOSIS — N8184 Pelvic muscle wasting: Secondary | ICD-10-CM | POA: Diagnosis not present

## 2022-03-22 DIAGNOSIS — G35 Multiple sclerosis: Secondary | ICD-10-CM | POA: Diagnosis not present

## 2022-03-22 DIAGNOSIS — I700 Atherosclerosis of aorta: Secondary | ICD-10-CM | POA: Diagnosis not present

## 2022-03-22 DIAGNOSIS — I10 Essential (primary) hypertension: Secondary | ICD-10-CM | POA: Diagnosis not present

## 2022-03-22 DIAGNOSIS — G8929 Other chronic pain: Secondary | ICD-10-CM | POA: Diagnosis not present

## 2022-03-25 DIAGNOSIS — G35 Multiple sclerosis: Secondary | ICD-10-CM | POA: Diagnosis not present

## 2022-03-25 DIAGNOSIS — G8929 Other chronic pain: Secondary | ICD-10-CM | POA: Diagnosis not present

## 2022-03-25 DIAGNOSIS — N200 Calculus of kidney: Secondary | ICD-10-CM | POA: Diagnosis not present

## 2022-03-25 DIAGNOSIS — I700 Atherosclerosis of aorta: Secondary | ICD-10-CM | POA: Diagnosis not present

## 2022-03-25 DIAGNOSIS — G8220 Paraplegia, unspecified: Secondary | ICD-10-CM | POA: Diagnosis not present

## 2022-03-25 DIAGNOSIS — N8184 Pelvic muscle wasting: Secondary | ICD-10-CM | POA: Diagnosis not present

## 2022-03-25 DIAGNOSIS — L89314 Pressure ulcer of right buttock, stage 4: Secondary | ICD-10-CM | POA: Diagnosis not present

## 2022-03-25 DIAGNOSIS — I10 Essential (primary) hypertension: Secondary | ICD-10-CM | POA: Diagnosis not present

## 2022-03-25 DIAGNOSIS — E440 Moderate protein-calorie malnutrition: Secondary | ICD-10-CM | POA: Diagnosis not present

## 2022-03-29 DIAGNOSIS — I700 Atherosclerosis of aorta: Secondary | ICD-10-CM | POA: Diagnosis not present

## 2022-03-29 DIAGNOSIS — E440 Moderate protein-calorie malnutrition: Secondary | ICD-10-CM | POA: Diagnosis not present

## 2022-03-29 DIAGNOSIS — I10 Essential (primary) hypertension: Secondary | ICD-10-CM | POA: Diagnosis not present

## 2022-03-29 DIAGNOSIS — G35 Multiple sclerosis: Secondary | ICD-10-CM | POA: Diagnosis not present

## 2022-03-29 DIAGNOSIS — G8929 Other chronic pain: Secondary | ICD-10-CM | POA: Diagnosis not present

## 2022-03-29 DIAGNOSIS — N8184 Pelvic muscle wasting: Secondary | ICD-10-CM | POA: Diagnosis not present

## 2022-03-29 DIAGNOSIS — L89314 Pressure ulcer of right buttock, stage 4: Secondary | ICD-10-CM | POA: Diagnosis not present

## 2022-03-29 DIAGNOSIS — G8220 Paraplegia, unspecified: Secondary | ICD-10-CM | POA: Diagnosis not present

## 2022-03-29 DIAGNOSIS — N200 Calculus of kidney: Secondary | ICD-10-CM | POA: Diagnosis not present

## 2022-04-01 DIAGNOSIS — N8184 Pelvic muscle wasting: Secondary | ICD-10-CM | POA: Diagnosis not present

## 2022-04-01 DIAGNOSIS — G35 Multiple sclerosis: Secondary | ICD-10-CM | POA: Diagnosis not present

## 2022-04-01 DIAGNOSIS — G8929 Other chronic pain: Secondary | ICD-10-CM | POA: Diagnosis not present

## 2022-04-01 DIAGNOSIS — L89314 Pressure ulcer of right buttock, stage 4: Secondary | ICD-10-CM | POA: Diagnosis not present

## 2022-04-01 DIAGNOSIS — I10 Essential (primary) hypertension: Secondary | ICD-10-CM | POA: Diagnosis not present

## 2022-04-01 DIAGNOSIS — E440 Moderate protein-calorie malnutrition: Secondary | ICD-10-CM | POA: Diagnosis not present

## 2022-04-01 DIAGNOSIS — N200 Calculus of kidney: Secondary | ICD-10-CM | POA: Diagnosis not present

## 2022-04-01 DIAGNOSIS — G8220 Paraplegia, unspecified: Secondary | ICD-10-CM | POA: Diagnosis not present

## 2022-04-01 DIAGNOSIS — I700 Atherosclerosis of aorta: Secondary | ICD-10-CM | POA: Diagnosis not present

## 2022-04-04 DIAGNOSIS — L89314 Pressure ulcer of right buttock, stage 4: Secondary | ICD-10-CM | POA: Diagnosis not present

## 2022-04-04 DIAGNOSIS — E440 Moderate protein-calorie malnutrition: Secondary | ICD-10-CM | POA: Diagnosis not present

## 2022-04-04 DIAGNOSIS — N8184 Pelvic muscle wasting: Secondary | ICD-10-CM | POA: Diagnosis not present

## 2022-04-04 DIAGNOSIS — G35 Multiple sclerosis: Secondary | ICD-10-CM | POA: Diagnosis not present

## 2022-04-04 DIAGNOSIS — N200 Calculus of kidney: Secondary | ICD-10-CM | POA: Diagnosis not present

## 2022-04-04 DIAGNOSIS — I700 Atherosclerosis of aorta: Secondary | ICD-10-CM | POA: Diagnosis not present

## 2022-04-04 DIAGNOSIS — G8929 Other chronic pain: Secondary | ICD-10-CM | POA: Diagnosis not present

## 2022-04-04 DIAGNOSIS — G8220 Paraplegia, unspecified: Secondary | ICD-10-CM | POA: Diagnosis not present

## 2022-04-04 DIAGNOSIS — I10 Essential (primary) hypertension: Secondary | ICD-10-CM | POA: Diagnosis not present

## 2022-04-09 DIAGNOSIS — I700 Atherosclerosis of aorta: Secondary | ICD-10-CM | POA: Diagnosis not present

## 2022-04-09 DIAGNOSIS — G8929 Other chronic pain: Secondary | ICD-10-CM | POA: Diagnosis not present

## 2022-04-09 DIAGNOSIS — G35 Multiple sclerosis: Secondary | ICD-10-CM | POA: Diagnosis not present

## 2022-04-09 DIAGNOSIS — I10 Essential (primary) hypertension: Secondary | ICD-10-CM | POA: Diagnosis not present

## 2022-04-09 DIAGNOSIS — N8184 Pelvic muscle wasting: Secondary | ICD-10-CM | POA: Diagnosis not present

## 2022-04-09 DIAGNOSIS — E440 Moderate protein-calorie malnutrition: Secondary | ICD-10-CM | POA: Diagnosis not present

## 2022-04-09 DIAGNOSIS — G8220 Paraplegia, unspecified: Secondary | ICD-10-CM | POA: Diagnosis not present

## 2022-04-09 DIAGNOSIS — N200 Calculus of kidney: Secondary | ICD-10-CM | POA: Diagnosis not present

## 2022-04-09 DIAGNOSIS — L89314 Pressure ulcer of right buttock, stage 4: Secondary | ICD-10-CM | POA: Diagnosis not present

## 2022-04-12 DIAGNOSIS — G35 Multiple sclerosis: Secondary | ICD-10-CM | POA: Diagnosis not present

## 2022-04-12 DIAGNOSIS — L89314 Pressure ulcer of right buttock, stage 4: Secondary | ICD-10-CM | POA: Diagnosis not present

## 2022-04-12 DIAGNOSIS — I10 Essential (primary) hypertension: Secondary | ICD-10-CM | POA: Diagnosis not present

## 2022-04-12 DIAGNOSIS — G8220 Paraplegia, unspecified: Secondary | ICD-10-CM | POA: Diagnosis not present

## 2022-04-12 DIAGNOSIS — I700 Atherosclerosis of aorta: Secondary | ICD-10-CM | POA: Diagnosis not present

## 2022-04-12 DIAGNOSIS — G8929 Other chronic pain: Secondary | ICD-10-CM | POA: Diagnosis not present

## 2022-04-12 DIAGNOSIS — N8184 Pelvic muscle wasting: Secondary | ICD-10-CM | POA: Diagnosis not present

## 2022-04-12 DIAGNOSIS — E440 Moderate protein-calorie malnutrition: Secondary | ICD-10-CM | POA: Diagnosis not present

## 2022-04-12 DIAGNOSIS — N200 Calculus of kidney: Secondary | ICD-10-CM | POA: Diagnosis not present

## 2022-04-15 DIAGNOSIS — N8184 Pelvic muscle wasting: Secondary | ICD-10-CM | POA: Diagnosis not present

## 2022-04-15 DIAGNOSIS — G8220 Paraplegia, unspecified: Secondary | ICD-10-CM | POA: Diagnosis not present

## 2022-04-15 DIAGNOSIS — G35 Multiple sclerosis: Secondary | ICD-10-CM | POA: Diagnosis not present

## 2022-04-15 DIAGNOSIS — L89314 Pressure ulcer of right buttock, stage 4: Secondary | ICD-10-CM | POA: Diagnosis not present

## 2022-04-15 DIAGNOSIS — E440 Moderate protein-calorie malnutrition: Secondary | ICD-10-CM | POA: Diagnosis not present

## 2022-04-15 DIAGNOSIS — I10 Essential (primary) hypertension: Secondary | ICD-10-CM | POA: Diagnosis not present

## 2022-04-15 DIAGNOSIS — I700 Atherosclerosis of aorta: Secondary | ICD-10-CM | POA: Diagnosis not present

## 2022-04-15 DIAGNOSIS — G8929 Other chronic pain: Secondary | ICD-10-CM | POA: Diagnosis not present

## 2022-04-15 DIAGNOSIS — N200 Calculus of kidney: Secondary | ICD-10-CM | POA: Diagnosis not present

## 2022-04-18 DIAGNOSIS — G8929 Other chronic pain: Secondary | ICD-10-CM | POA: Diagnosis not present

## 2022-04-18 DIAGNOSIS — L89314 Pressure ulcer of right buttock, stage 4: Secondary | ICD-10-CM | POA: Diagnosis not present

## 2022-04-18 DIAGNOSIS — N200 Calculus of kidney: Secondary | ICD-10-CM | POA: Diagnosis not present

## 2022-04-18 DIAGNOSIS — E440 Moderate protein-calorie malnutrition: Secondary | ICD-10-CM | POA: Diagnosis not present

## 2022-04-18 DIAGNOSIS — N8184 Pelvic muscle wasting: Secondary | ICD-10-CM | POA: Diagnosis not present

## 2022-04-18 DIAGNOSIS — I700 Atherosclerosis of aorta: Secondary | ICD-10-CM | POA: Diagnosis not present

## 2022-04-18 DIAGNOSIS — I10 Essential (primary) hypertension: Secondary | ICD-10-CM | POA: Diagnosis not present

## 2022-04-18 DIAGNOSIS — G8220 Paraplegia, unspecified: Secondary | ICD-10-CM | POA: Diagnosis not present

## 2022-04-18 DIAGNOSIS — G35 Multiple sclerosis: Secondary | ICD-10-CM | POA: Diagnosis not present

## 2022-04-22 DIAGNOSIS — G35 Multiple sclerosis: Secondary | ICD-10-CM | POA: Diagnosis not present

## 2022-04-22 DIAGNOSIS — I700 Atherosclerosis of aorta: Secondary | ICD-10-CM | POA: Diagnosis not present

## 2022-04-22 DIAGNOSIS — E440 Moderate protein-calorie malnutrition: Secondary | ICD-10-CM | POA: Diagnosis not present

## 2022-04-22 DIAGNOSIS — G8929 Other chronic pain: Secondary | ICD-10-CM | POA: Diagnosis not present

## 2022-04-22 DIAGNOSIS — N8184 Pelvic muscle wasting: Secondary | ICD-10-CM | POA: Diagnosis not present

## 2022-04-22 DIAGNOSIS — L89314 Pressure ulcer of right buttock, stage 4: Secondary | ICD-10-CM | POA: Diagnosis not present

## 2022-04-22 DIAGNOSIS — I10 Essential (primary) hypertension: Secondary | ICD-10-CM | POA: Diagnosis not present

## 2022-04-22 DIAGNOSIS — N200 Calculus of kidney: Secondary | ICD-10-CM | POA: Diagnosis not present

## 2022-04-22 DIAGNOSIS — G8220 Paraplegia, unspecified: Secondary | ICD-10-CM | POA: Diagnosis not present

## 2022-04-25 DIAGNOSIS — L89314 Pressure ulcer of right buttock, stage 4: Secondary | ICD-10-CM | POA: Diagnosis not present

## 2022-04-25 DIAGNOSIS — G8929 Other chronic pain: Secondary | ICD-10-CM | POA: Diagnosis not present

## 2022-04-25 DIAGNOSIS — G35 Multiple sclerosis: Secondary | ICD-10-CM | POA: Diagnosis not present

## 2022-04-25 DIAGNOSIS — I700 Atherosclerosis of aorta: Secondary | ICD-10-CM | POA: Diagnosis not present

## 2022-04-25 DIAGNOSIS — G8220 Paraplegia, unspecified: Secondary | ICD-10-CM | POA: Diagnosis not present

## 2022-04-25 DIAGNOSIS — N200 Calculus of kidney: Secondary | ICD-10-CM | POA: Diagnosis not present

## 2022-04-25 DIAGNOSIS — E440 Moderate protein-calorie malnutrition: Secondary | ICD-10-CM | POA: Diagnosis not present

## 2022-04-25 DIAGNOSIS — N8184 Pelvic muscle wasting: Secondary | ICD-10-CM | POA: Diagnosis not present

## 2022-04-25 DIAGNOSIS — I10 Essential (primary) hypertension: Secondary | ICD-10-CM | POA: Diagnosis not present

## 2022-04-29 ENCOUNTER — Encounter (HOSPITAL_BASED_OUTPATIENT_CLINIC_OR_DEPARTMENT_OTHER): Payer: Medicare PPO | Attending: Internal Medicine | Admitting: Internal Medicine

## 2022-04-29 DIAGNOSIS — G35 Multiple sclerosis: Secondary | ICD-10-CM | POA: Diagnosis not present

## 2022-04-29 DIAGNOSIS — G8220 Paraplegia, unspecified: Secondary | ICD-10-CM | POA: Insufficient documentation

## 2022-04-29 DIAGNOSIS — I10 Essential (primary) hypertension: Secondary | ICD-10-CM | POA: Diagnosis not present

## 2022-04-29 DIAGNOSIS — L89314 Pressure ulcer of right buttock, stage 4: Secondary | ICD-10-CM | POA: Diagnosis not present

## 2022-04-29 DIAGNOSIS — M4628 Osteomyelitis of vertebra, sacral and sacrococcygeal region: Secondary | ICD-10-CM

## 2022-04-30 DIAGNOSIS — G8220 Paraplegia, unspecified: Secondary | ICD-10-CM | POA: Diagnosis not present

## 2022-04-30 DIAGNOSIS — G35 Multiple sclerosis: Secondary | ICD-10-CM | POA: Diagnosis not present

## 2022-04-30 DIAGNOSIS — N200 Calculus of kidney: Secondary | ICD-10-CM | POA: Diagnosis not present

## 2022-04-30 DIAGNOSIS — L89314 Pressure ulcer of right buttock, stage 4: Secondary | ICD-10-CM | POA: Diagnosis not present

## 2022-04-30 DIAGNOSIS — N8184 Pelvic muscle wasting: Secondary | ICD-10-CM | POA: Diagnosis not present

## 2022-04-30 DIAGNOSIS — G8929 Other chronic pain: Secondary | ICD-10-CM | POA: Diagnosis not present

## 2022-04-30 DIAGNOSIS — I10 Essential (primary) hypertension: Secondary | ICD-10-CM | POA: Diagnosis not present

## 2022-04-30 DIAGNOSIS — I700 Atherosclerosis of aorta: Secondary | ICD-10-CM | POA: Diagnosis not present

## 2022-04-30 DIAGNOSIS — E440 Moderate protein-calorie malnutrition: Secondary | ICD-10-CM | POA: Diagnosis not present

## 2022-04-30 NOTE — Progress Notes (Signed)
[name], [name] ([id number]) [id number] Page 1 of 7 Visit Report for 04/29/2022 Chief Complaint Document Details Patient Name: Date of Service: [name], [name] 04/29/2022 10:15 A M Medical Record Number: [id number] Patient Account Number: [id number] Date of Birth/Sex: Treating RN: Jul 10, 1970 (52 y.o. F) Primary Care Provider: Alvester Chou Other Clinician: Referring Provider: Treating Provider/Extender: Haydee Salter in Treatment: 6 Information Obtained from: Patient Chief Complaint 03/26/21; Right gluteus wound Electronic Signature(s) Signed: 04/29/2022 3:39:58 PM By: Kalman Shan DO Entered By: Kalman Shan on 04/29/2022 10:42:41 -------------------------------------------------------------------------------- HPI Details Patient Name: Date of Service: [name], [name] 04/29/2022 10:15 A M Medical Record Number: [id number] Patient Account Number: [id number] Date of Birth/Sex: Treating RN: 26-Apr-1970 (52 y.o. F) Primary Care Provider: Alvester Chou Other Clinician: Referring Provider: Treating Provider/Extender: Haydee Salter in Treatment: 39 History of Present Illness HPI Description: Admission 03/26/2021 [name] is a 52 year old female with a past medical history of multiple sclerosis for the past 16 years, complicated by severe debility and bedbound since 2015 that presents to the clinic for a 50-monthhistory of ulcer to the right buttocks. She has been admitted to the hospital several times for this issue. She was last hospitalized on 02/18/2021 for septic shock due to the infected ulcer. She was evaluated by infectious disease at that time. She completed a 2- week course of antibiotics. There is no plan for follow-up. She is currently using calcium alginate dressings. She has home health that comes out twice weekly. She currently denies signs of infection. She has an air mattress for offloading.  Of note She has a history of osteomyelitis to the sacrum and treated in 2020 with IV antibiotics for 8 weeks. 3/21; patient presents for follow-up. She has been using calcium alginate to the wound bed and using Vashe to help clean prior to dressing changes. She has no issues or complaints today. 5/16; patient presents for follow-up. She has been using calcium alginate to the wound bed. She has no issues or complaints today. She is not able to aggressively offload the area due to her multiple sclerosis. 7/18; patient presents for follow-up. She had a televisit with Dr. CLinus Salmonson 08/03/2021 for possible osteomyelitis noted on x-ray. She was started on doxycycline and Levaquin and completed her 4-week course. She has been using calcium alginate to the wound bed. She has no issues or complaints today. 11/27; patient missed her last clinic appointment. She was last seen 4 months ago. She has a chronic wound to her right buttocks that has been treated for osteomyelitis. She has been using calcium alginate to the wound bed. She uses a wound cleanser in between dressing changes. She tries to aggressively offload the area. She states that due to cost she cannot come in frequently. 2/19; patient presents for follow-up. She follows in our clinic every 2 to 3 months for her chronic osteomyelitis sacral wound. She has been using calcium alginate and cleaning the area with Vashe. She has no issues or complaints today. She states she would like to follow in our clinic every 3 months and cannot do so sooner due to cost of transportation. Electronic Signature(s) Signed: 04/29/2022 3:39:58 PM By: HKalman ShanDO Entered By: HKalman Shanon 04/29/2022 10:44:46 SVernona Rieger([id number] Page 2 of 7 -------------------------------------------------------------------------------- Physical Exam Details Patient Name: Date of Service: [name], [name] 04/29/2022 10:15 A  M Medical Record Number: [id number] Account Number: [id number] of Birth/Sex:  Treating RN: 1970/08/02 (52 y.o. F) Primary Care Provider: Alvester Chou Other Clinician: Referring Provider: Treating Provider/Extender: Haydee Salter in Treatment: 28 Constitutional respirations regular, non-labored and within target range for patient.. Cardiovascular 2+ dorsalis pedis/posterior tibialis pulses. Psychiatric pleasant and cooperative. Notes Right buttocks: Open wound with granulation tissue and undermining. This overlies very closely to bone. No bone exposed. Surrounding skin intact. No rashes. No signs of surrounding soft tissue infection. Electronic Signature(s) Signed: 04/29/2022 3:39:58 PM By: Kalman Shan DO Entered By: Kalman Shan on 04/29/2022 10:45:08 -------------------------------------------------------------------------------- Physician Orders Details Patient Name: Date of Service: [name], [name] 04/29/2022 10:15 A M Medical Record Number: [id number] Patient Account Number: [id number] Date of Birth/Sex: Treating RN: Jun 23, 1970 (52 y.o. F) Primary Care Provider: Alvester Chou Other Clinician: Referring Provider: Treating Provider/Extender: Haydee Salter in Treatment: 23 Verbal / Phone Orders: No Diagnosis Coding Follow-up Appointments ppointment in: - 3 months follow up with Dr. Heber [place] ******STRETCHER ROOM***** Return A patient may speak with primary to continue orders for wound care and home health due to cost of wound care trips for appts. Off-Loading Low air-loss mattress (Group 2) Turn and reposition every 2 hours Home Health No change in wound care orders this week; continue Home Health for wound care. May utilize formulary equivalent dressing for wound treatment orders unless otherwise specified. Other Home Health Orders/Instructions: - [facility] Wound Treatment Wound #1 - Gluteus Wound Laterality:  Right Cleanser: Vashe 5.8 (oz) (Home Health) 1 x Per Day/Other:3 months Discharge Instructions: Cleanse the wound with Vashe prior to applying a clean dressing using gauze sponges, not tissue or cotton balls. Prim Dressing: Sorbalgon Dressing 4x4 (in/in) 1 x Per Day/Other:3 months ary Discharge Instructions: Apply to wound bed as instructed KAYANA, FOLDEN ([id number]) [phone].pdf Page 3 of 7 Secondary Dressing: Zetuvit Plus Silicone Border Sacrum Dressing, Sm, 7x7 (in/in) ([place]) 1 x Per Day/Other:3 months Discharge Instructions: Apply silicone border over primary dressing as directed. Electronic Signature(s) Signed: 04/29/2022 3:39:58 PM By: Kalman Shan DO Previous Signature: 04/29/2022 10:35:38 AM Version By: Kalman Shan DO Entered By: Kalman Shan on 04/29/2022 10:45:16 -------------------------------------------------------------------------------- Problem List Details Patient Name: Date of Service: [name], [name] 04/29/2022 10:15 A M Medical Record Number: [id number] Patient Account Number: [id number] Date of Birth/Sex: Treating RN: December 10, 1970 (52 y.o. F) Primary Care Provider: Alvester Chou Other Clinician: Referring Provider: Treating Provider/Extender: Haydee Salter in Treatment: 48 Active Problems ICD-10 Encounter Code Description Active Date MDM Diagnosis L89.314 Pressure ulcer of right buttock, stage 4 03/26/2021 No Yes G35 Multiple sclerosis 03/26/2021 No Yes M46.28 Osteomyelitis of vertebra, sacral and sacrococcygeal region 03/26/2021 No Yes Inactive Problems Resolved Problems Electronic Signature(s) Signed: 04/29/2022 3:39:58 PM By: Kalman Shan DO Entered By: Kalman Shan on 04/29/2022 10:42:05 -------------------------------------------------------------------------------- Progress Note Details Patient Name: Date of Service: [name], [name] 04/29/2022 10:15 A M Medical Record Number:  [id number] Patient Account Number: [id number] Date of Birth/Sex: Treating RN: 22-Oct-1970 (52 y.o. F) Primary Care Provider: Alvester Chou Other Clinician: Referring Provider: Treating Provider/Extender: Haydee Salter in Treatment: [address] Subjective Chief Complaint DEONDRIA, BERTON ([id number]) [id number] Page 4 of 7 Information obtained from Patient 03/26/21; Right gluteus wound History of Present Illness (HPI) Admission 03/26/2021 Ms. Alizaya Menza is a 52 year old female with a past medical history of multiple sclerosis for the past 16 years, complicated by severe debility and bedbound since 2015 that presents to the clinic for a 41-monthhistory of ulcer  to the right buttocks. She has been admitted to the hospital several times for this issue. She was last hospitalized on 02/18/2021 for septic shock due to the infected ulcer. She was evaluated by infectious disease at that time. She completed a 2- week course of antibiotics. There is no plan for follow-up. She is currently using calcium alginate dressings. She has home health that comes out twice weekly. She currently denies signs of infection. She has an air mattress for offloading. Of note She has a history of osteomyelitis to the sacrum and treated in 2020 with IV antibiotics for 8 weeks. 3/21; patient presents for follow-up. She has been using calcium alginate to the wound bed and using Vashe to help clean prior to dressing changes. She has no issues or complaints today. 5/16; patient presents for follow-up. She has been using calcium alginate to the wound bed. She has no issues or complaints today. She is not able to aggressively offload the area due to her multiple sclerosis. 7/18; patient presents for follow-up. She had a televisit with Dr. Linus Salmons on 08/03/2021 for possible osteomyelitis noted on x-ray. She was started on doxycycline and Levaquin and completed her 4-week course. She has been  using calcium alginate to the wound bed. She has no issues or complaints today. 11/27; patient missed her last clinic appointment. She was last seen 4 months ago. She has a chronic wound to her right buttocks that has been treated for osteomyelitis. She has been using calcium alginate to the wound bed. She uses a wound cleanser in between dressing changes. She tries to aggressively offload the area. She states that due to cost she cannot come in frequently. 2/19; patient presents for follow-up. She follows in our clinic every 2 to 3 months for her chronic osteomyelitis sacral wound. She has been using calcium alginate and cleaning the area with Vashe. She has no issues or complaints today. She states she would like to follow in our clinic every 3 months and cannot do so sooner due to cost of transportation. Patient History Information obtained from Patient. Family History Unknown History. Social History Former smoker, Marital Status - Single, Alcohol Use - Never, Drug Use - No History, Caffeine Use - Rarely. Medical History Cardiovascular Patient has history of Hypertension Denies history of Deep Vein Thrombosis Integumentary (Skin) Denies history of History of Burn Musculoskeletal Patient has history of Osteomyelitis - right ischial tuberosity Neurologic Patient has history of Paraplegia Medical A Surgical History Notes nd Neurologic Multiple Sclerosis Objective Constitutional respirations regular, non-labored and within target range for patient.. Vitals Time Taken: 10:17 AM, Height: 62 in, Weight: 145 lbs, BMI: 26.5, Temperature: 98 F, Pulse: 79 bpm, Respiratory Rate: 18 breaths/min, Blood Pressure: 128/81 mmHg. Cardiovascular 2+ dorsalis pedis/posterior tibialis pulses. Psychiatric pleasant and cooperative. General Notes: Right buttocks: Open wound with granulation tissue and undermining. This overlies very closely to bone. No bone exposed. Surrounding skin intact. No  rashes. No signs of surrounding soft tissue infection. Integumentary (Hair, Skin) Wound #1 status is Open. Original cause of wound was Pressure Injury. The date acquired was: 12/09/2020. The wound has been in treatment 57 weeks. The wound is located on the Right Gluteus. The wound measures 4.5cm length x 4.5cm width x 4cm depth; 15.904cm^2 area and 63.617cm^3 volume. There is Fat Layer (Subcutaneous Tissue) exposed. There is a medium amount of serosanguineous drainage noted. The wound margin is well defined and not attached to the wound base. There is large (67-100%) red, pink, friable granulation within the  wound bed. There is no necrotic tissue within the wound bed. The periwound skin appearance did not exhibit: Callus, Crepitus, Excoriation, Induration, Rash, Scarring, Dry/Scaly, Maceration, Atrophie Blanche, Cyanosis, Ecchymosis, Hemosiderin Staining, Mottled, Pallor, Rubor, Erythema. JISSELLE, KRIEGEL ([id number]) [id number] Page 5 of 7 Assessment Active Problems ICD-10 Pressure ulcer of right buttock, stage 4 Multiple sclerosis Osteomyelitis of vertebra, sacral and sacrococcygeal region Patient's wound is stable. She has been using calcium alginate and cleaning the area with Vashe. I recommended continuing this and aggressive offloading. She will follow in our clinic every 3 months. She knows to call with any questions or concerns. Plan Follow-up Appointments: Return Appointment in: - 3 months follow up with Dr. Heber [place] ******STRETCHER ROOM***** patient may speak with primary to continue orders for wound care and home health due to cost of wound care trips for appts. Off-Loading: Low air-loss mattress (Group 2) Turn and reposition every 2 hours Home Health: No change in wound care orders this week; continue Home Health for wound care. May utilize formulary equivalent dressing for wound treatment orders unless otherwise specified. Other Home Health  Orders/Instructions: - [facility] WOUND #1: - Gluteus Wound Laterality: Right Cleanser: Vashe 5.8 (oz) (Home Health) 1 x Per Day/Other:3 months Discharge Instructions: Cleanse the wound with Vashe prior to applying a clean dressing using gauze sponges, not tissue or cotton balls. Prim Dressing: Sorbalgon Dressing 4x4 (in/in) 1 x Per Day/Other:3 months ary Discharge Instructions: Apply to wound bed as instructed Secondary Dressing: Zetuvit Plus Silicone Border Sacrum Dressing, Sm, 7x7 (in/in) ([place]) 1 x Per Day/Other:3 months Discharge Instructions: Apply silicone border over primary dressing as directed. 1. Calcium alginate 2. Vashe cleanser 3. Aggressive offloading 4. Follow-up every 3 months Electronic Signature(s) Signed: 04/29/2022 3:39:58 PM By: Kalman Shan DO Entered By: Kalman Shan on 04/29/2022 10:53:20 -------------------------------------------------------------------------------- HxROS Details Patient Name: Date of Service: [name], [name] 04/29/2022 10:15 A M Medical Record Number: [id number] Patient Account Number: [id number] Date of Birth/Sex: Treating RN: 05/27/1970 (52 y.o. F) Primary Care Provider: Alvester Chou Other Clinician: Referring Provider: Treating Provider/Extender: Haydee Salter in Treatment: 58 Information Obtained From Patient Cardiovascular Medical History: Positive for: Hypertension Negative for: Deep Vein Thrombosis Integumentary (Skin) Facchini, Anhelica L ([id number]) [phone].pdf Page 6 of 7 Medical History: Negative for: History of Burn Musculoskeletal Medical History: Positive for: Osteomyelitis - right ischial tuberosity Neurologic Medical History: Positive for: Paraplegia Past Medical History Notes: Multiple Sclerosis Immunizations Pneumococcal Vaccine: Received Pneumococcal Vaccination: No Implantable Devices Yes Family and Social History Unknown History: Yes; Former  smoker; Marital Status - Single; Alcohol Use: Never; Drug Use: No History; Caffeine Use: Rarely; Financial Concerns: No; Food, Clothing or Shelter Needs: No; Support System Lacking: No; Transportation Concerns: No Electronic Signature(s) Signed: 04/29/2022 3:39:58 PM By: Kalman Shan DO Entered By: Kalman Shan on 04/29/2022 10:44:52 -------------------------------------------------------------------------------- SuperBill Details Patient Name: Date of Service: [name], [name] 04/29/2022 Medical Record Number: [id number] Patient Account Number: [id number] Date of Birth/Sex: Treating RN: Oct 12, 1970 (52 y.o. Tonita Phoenix, Lauren Primary Care Provider: Alvester Chou Other Clinician: Referring Provider: Treating Provider/Extender: Haydee Salter in Treatment: 23 Diagnosis Coding ICD-10 Codes Code Description L89.314 Pressure ulcer of right buttock, stage 4 G35 Multiple sclerosis M46.28 Osteomyelitis of vertebra, sacral and sacrococcygeal region Facility Procedures : CPT4 Code: [id number] Description: 99213 - WOUND CARE VISIT-LEV 3 EST PT Modifier: Quantity: 1 Physician Procedures : CPT4 Code Description Modifier [id number] - WC PHYS LEVEL 3 - EST PT ICD-10 Diagnosis  Description L89.314 Pressure ulcer of right buttock, stage 4 G35 Multiple sclerosis M46.28 Osteomyelitis of vertebra, sacral and sacrococcygeal region Quantity: 1 Electronic Signature(s) Apollo, Danee L ([id number]) [phone].pdf Page 7 of 7 Signed: 04/29/2022 3:39:58 PM By: Kalman Shan DO Entered By: Kalman Shan on 04/29/2022 10:53:31

## 2022-05-01 NOTE — Progress Notes (Signed)
HYDEIA, OSHITA ([id number]) [id number] Page 1 of 9 Visit Report for 04/29/2022 Arrival Information Details Patient Name: Date of Service: [name], [name]. 04/29/2022 10:15 A M Medical Record Number: [id number] Patient Account Number: [id number] Date of Birth/Sex: Treating RN: 1970-04-28 (52 y.o. F) Primary Care Agapita Savarino: Alvester Chou Other Clinician: Referring Jefferson Fullam: Treating Calyb Mcquarrie/Extender: [name] in Treatment: 27 Visit Information History Since Last Visit Added or deleted any medications: No Patient Arrived: Stretcher Any new allergies or adverse reactions: No Arrival Time: 10:17 Had a fall or experienced change in No Accompanied By: self activities of daily living that may affect Transfer Assistance: None risk of falls: Patient Identification Verified: Yes Signs or symptoms of abuse/neglect since last visito No Secondary Verification Process Completed: Yes Hospitalized since last visit: No Patient Requires Transmission-Based Precautions: No Implantable device outside of the clinic excluding No Patient Has Alerts: No cellular tissue based products placed in the center since last visit: Has Dressing in Place as Prescribed: Yes Pain Present Now: No Electronic Signature(s) Signed: 04/30/2022 4:38:15 PM By: Erenest Blank Entered By: Erenest Blank on 04/29/2022 10:17:34 -------------------------------------------------------------------------------- Clinic Level of Care Assessment Details Patient Name: Date of Service: [name], [name]. 04/29/2022 10:15 A M Medical Record Number: [id number] Patient Account Number: [id number] Date of Birth/Sex: Treating RN: [birth year] (51 y.o. Tonita Phoenix, Lauren Primary Care Wilson Sample: Alvester Chou Other Clinician: Referring Dabney Dever: Treating Santhosh Gulino/Extender: [name] in Treatment: 13 Clinic Level of Care Assessment Items TOOL 4 Quantity Score X- 1  0 Use when only an EandM is performed on FOLLOW-UP visit ASSESSMENTS - Nursing Assessment / Reassessment X- 1 10 Reassessment of Co-morbidities (includes updates in patient status) X- 1 5 Reassessment of Adherence to Treatment Plan ASSESSMENTS - Wound and Skin A ssessment / Reassessment X - Simple Wound Assessment / Reassessment - one wound 1 5 []$  - 0 Complex Wound Assessment / Reassessment - multiple wounds []$  - 0 Dermatologic / Skin Assessment (not related to wound area) ASSESSMENTS - Focused Assessment []$  - 0 Circumferential Edema Measurements - multi extremities []$  - 0 Nutritional Assessment / Counseling / Intervention [name], [name] ([id number]) [id number] Page 2 of 9 []$  - 0 Lower Extremity Assessment (monofilament, tuning fork, pulses) []$  - 0 Peripheral Arterial Disease Assessment (using hand held doppler) ASSESSMENTS - Ostomy and/or Continence Assessment and Care []$  - 0 Incontinence Assessment and Management []$  - 0 Ostomy Care Assessment and Management (repouching, etc.) PROCESS - Coordination of Care X - Simple Patient / Family Education for ongoing care 1 15 []$  - 0 Complex (extensive) Patient / Family Education for ongoing care X- 1 10 Staff obtains Programmer, systems, Records, T Results / Process Orders est X- 1 10 Staff telephones HHA, Nursing Homes / Clarify orders / etc []$  - 0 Routine Transfer to another Facility (non-emergent condition) []$  - 0 Routine Hospital Admission (non-emergent condition) []$  - 0 New Admissions / Biomedical engineer / Ordering NPWT Apligraf, etc. , []$  - 0 Emergency Hospital Admission (emergent condition) X- 1 10 Simple Discharge Coordination []$  - 0 Complex (extensive) Discharge Coordination PROCESS - Special Needs []$  - 0 Pediatric / Minor Patient Management []$  - 0 Isolation Patient Management []$  - 0 Hearing / Language / Visual special needs []$  - 0 Assessment of Community assistance (transportation,  D/C planning, etc.) []$  - 0 Additional assistance / Altered mentation []$  - 0 Support Surface(s) Assessment (bed, cushion, seat, etc.) INTERVENTIONS - Wound Cleansing / Measurement X - Simple Wound Cleansing -  one wound 1 5 []$  - 0 Complex Wound Cleansing - multiple wounds X- 1 5 Wound Imaging (photographs - any number of wounds) []$  - 0 Wound Tracing (instead of photographs) X- 1 5 Simple Wound Measurement - one wound []$  - 0 Complex Wound Measurement - multiple wounds INTERVENTIONS - Wound Dressings X - Small Wound Dressing one or multiple wounds 1 10 []$  - 0 Medium Wound Dressing one or multiple wounds []$  - 0 Large Wound Dressing one or multiple wounds []$  - 0 Application of Medications - topical []$  - 0 Application of Medications - injection INTERVENTIONS - Miscellaneous []$  - 0 External ear exam []$  - 0 Specimen Collection (cultures, biopsies, blood, body fluids, etc.) []$  - 0 Specimen(s) / Culture(s) sent or taken to Lab for analysis X- 1 10 Patient Transfer (multiple staff / Civil Service fast streamer / Similar devices) []$  - 0 Simple Staple / Suture removal (25 or less) []$  - 0 Complex Staple / Suture removal (26 or more) []$  - 0 Hypo / Hyperglycemic Management (close monitor of Blood Glucose) [name], [name] ([id number].pdf Page 3 of 9 []$  - 0 Ankle / Brachial Index (ABI) - do not check if billed separately X- 1 5 Vital Signs Has the patient been seen at the hospital within the last three years: Yes Total Score: 105 Level Of Care: New/Established - Level 3 Electronic Signature(s) Signed: 04/29/2022 4:48:40 PM By: Rhae Hammock RN Entered By: Rhae Hammock on 04/29/2022 10:50:11 -------------------------------------------------------------------------------- Encounter Discharge Information Details Patient Name: Date of Service: [name], [name]. 04/29/2022 10:15 A M Medical Record Number: [id number] Patient Account Number: [id number] Date  of Birth/Sex: Treating RN: 1970-10-13 (52 y.o. Tonita Phoenix, Lauren Primary Care Osie Amparo: Alvester Chou Other Clinician: Referring Rushawn Capshaw: Treating Audre Cenci/Extender: [name] in Treatment: 58 Encounter Discharge Information Items Discharge Condition: Stable Ambulatory Status: Stretcher Discharge Destination: Home Transportation: Ambulance Accompanied By: EMTS Schedule Follow-up Appointment: Yes Clinical Summary of Care: Patient Declined Electronic Signature(s) Signed: 04/29/2022 4:48:40 PM By: Rhae Hammock RN Entered By: Rhae Hammock on 04/29/2022 11:55:52 -------------------------------------------------------------------------------- Lower Extremity Assessment Details Patient Name: Date of Service: [name], [name]. 04/29/2022 10:15 A M Medical Record Number: [id number] Patient Account Number: [id number] Date of Birth/Sex: Treating RN: 10-08-1970 (52 y.o. F) Primary Care Danaysha Kirn: Alvester Chou Other Clinician: Referring Abriana Saltos: Treating Marbella Markgraf/Extender: [name] in Treatment: 70 Electronic Signature(s) Signed: 04/30/2022 4:38:15 PM By: Erenest Blank Entered By: Erenest Blank on 04/29/2022 10:18:07 -------------------------------------------------------------------------------- Multi Wound Chart Details Patient Name: Date of Service: [name], [name]. 04/29/2022 10:15 A M Medical Record Number: [id number] Patient Account Number: [id number] WILMA, BOADO ([id number]) [phone].pdf Page 4 of 9 Date of Birth/Sex: Treating RN: Sep 28, 1970 (52 y.o. F) Primary Care Asmaa Tirpak: Other Clinician: Alvester Chou Referring Neisha Hinger: Treating Maxson Oddo/Extender: [name] in Treatment: 5 Vital Signs Height(in): 62 Pulse(bpm): 79 Weight(lbs): 145 Blood Pressure(mmHg): 128/81 Body Mass Index(BMI): 26.5 Temperature(F): 98 Respiratory Rate(breaths/min): 18 [1:Photos:]  [N/A:N/A] Right Gluteus N/A N/A Wound Location: Pressure Injury N/A N/A Wounding Event: Pressure Ulcer N/A N/A Primary Etiology: Hypertension, Osteomyelitis, N/A N/A Comorbid History: Paraplegia 12/09/2020 N/A N/A Date Acquired: 22 N/A N/A Weeks of Treatment: Open N/A N/A Wound Status: No N/A N/A Wound Recurrence: 4.5x4.5x4 N/A N/A Measurements [name] x W x D (cm) 15.904 N/A N/A A (cm) : rea 63.617 N/A N/A Volume (cm) : 10.00% N/A N/A % Reduction in A rea: 25.00% N/A N/A % Reduction in Volume: Category/Stage IV N/A N/A  Classification: Medium N/A N/A Exudate A mount: Serosanguineous N/A N/A Exudate Type: red, brown N/A N/A Exudate Color: Well defined, not attached N/A N/A Wound Margin: Large (67-100%) N/A N/A Granulation A mount: Red, Pink, Friable N/A N/A Granulation Quality: None Present (0%) N/A N/A Necrotic A mount: Fat Layer (Subcutaneous Tissue): Yes N/A N/A Exposed Structures: Fascia: No Tendon: No Muscle: No Joint: No Bone: No None N/A N/A Epithelialization: Excoriation: No N/A N/A Periwound Skin Texture: Induration: No Callus: No Crepitus: No Rash: No Scarring: No Maceration: No N/A N/A Periwound Skin Moisture: Dry/Scaly: No Atrophie Blanche: No N/A N/A Periwound Skin Color: Cyanosis: No Ecchymosis: No Erythema: No Hemosiderin Staining: No Mottled: No Pallor: No Rubor: No Treatment Notes Electronic Signature(s) Signed: 04/29/2022 3:39:58 PM By: Kalman Shan DO Entered By: Kalman Shan on 04/29/2022 10:42:10 [name] ([id number].pdf Page 5 of 9 -------------------------------------------------------------------------------- Multi-Disciplinary Care Plan Details Patient Name: Date of Service: [name], [name]. 04/29/2022 10:15 A M Medical Record Number: [id number] Patient Account Number: [id number] Date of Birth/Sex: Treating RN: Dec 29, 1970 (52 y.o. F) Primary Care Yoel Kaufhold: Alvester Chou Other Clinician: Referring Sharisse Rantz: Treating Shakevia Sarris/Extender: [name] in Treatment: 61 [place] reviewed with physician Active Inactive Nutrition Nursing Diagnoses: Potential for alteratiion in Nutrition/Potential for imbalanced nutrition Goals: Patient/caregiver agrees to and verbalizes understanding of need to use nutritional supplements and/or vitamins as prescribed Date Initiated: 03/26/2021 Target Resolution Date: 05/02/2022 Goal Status: Active Interventions: Assess patient nutrition upon admission and as needed per policy Provide education on nutrition Treatment Activities: Education provided on Nutrition : 03/26/2021 Notes: Pressure Nursing Diagnoses: Knowledge deficit related to causes and risk factors for pressure ulcer development Knowledge deficit related to management of pressures ulcers Potential for impaired tissue integrity related to pressure, friction, moisture, and shear Goals: Patient/caregiver will verbalize risk factors for pressure ulcer development Date Initiated: 03/26/2021 Target Resolution Date: 03/07/2022 Goal Status: Active Patient/caregiver will verbalize understanding of pressure ulcer management Date Initiated: 03/26/2021 Date Inactivated: 02/04/2022 Target Resolution Date: 02/04/2022 Goal Status: Met Interventions: Assess: immobility, friction, shearing, incontinence upon admission and as needed Assess offloading mechanisms upon admission and as needed Assess potential for pressure ulcer upon admission and as needed Provide education on pressure ulcers Notes: 09/25/21: Uses air mattress, pressure relief ongoing. Wound/Skin Impairment Nursing Diagnoses: Impaired tissue integrity Knowledge deficit related to ulceration/compromised skin integrity Goals: Patient/caregiver will verbalize understanding of skin care regimen Date Initiated: 03/26/2021 Target Resolution Date: 11/27/2021 Goal  Status: Active Ulcer/skin breakdown will have a volume reduction of 30% by week 4 Date Initiated: 03/26/2021 Date Inactivated: 07/24/2021 Target Resolution Date: 04/27/2021 Goal Status: Met KYANN, VOLLMER ([id number]) [phone].pdf Page 6 of 9 Interventions: Assess patient/caregiver ability to obtain necessary supplies Assess patient/caregiver ability to perform ulcer/skin care regimen upon admission and as needed Assess ulceration(s) every visit Provide education on ulcer and skin care Notes: 07/24/21: Pressure relief ongoing. Patient has low loss air mattress. 09/25/21: Wound care regimen continues, has HH assisting with dressing changes. Electronic Signature(s) Signed: 04/30/2022 4:38:15 PM By: Erenest Blank Entered By: Erenest Blank on 04/29/2022 10:32:36 -------------------------------------------------------------------------------- Pain Assessment Details Patient Name: Date of Service: [name], [name]. 04/29/2022 10:15 A M Medical Record Number: [id number] Patient Account Number: [id number] Date of Birth/Sex: Treating RN: 01-Feb-1971 (52 y.o. F) Primary Care Nitika Jackowski: Alvester Chou Other Clinician: Referring Naomii Kreger: Treating Cadince Hilscher/Extender: [name] in Treatment: 75 Active Problems Location of Pain Severity and Description of Pain Patient Has Paino No Site Locations  Pain Management and Medication Current Pain Management: Electronic Signature(s) Signed: 04/30/2022 4:38:15 PM By: Erenest Blank Entered By: Erenest Blank on 04/29/2022 10:18:00 -------------------------------------------------------------------------------- Patient/Caregiver Education Details Patient Name: Date of Service: [name], [name] 2/19/2024andnbsp10:15 [place] Record Number: [id number] Patient Account Number: [id number] Date of Birth/Gender: Treating RN: [birth year] (52 y.o. Amariss, [place], [name] ([id number])  [phone].pdf Page 7 of 9 Primary Care Physician: Alvester Chou Other Clinician: Referring Physician: Treating Physician/Extender: [name] in Treatment: 61 Education Assessment Education Provided To: Patient Education Topics Provided Wound/Skin Impairment: Methods: Explain/Verbal Responses: Reinforcements needed, State content correctly Electronic Signature(s) Signed: 04/29/2022 4:48:40 PM By: Rhae Hammock RN Entered By: Rhae Hammock on 04/29/2022 10:49:24 -------------------------------------------------------------------------------- Wound Assessment Details Patient Name: Date of Service: [name], [name]. 04/29/2022 10:15 A M Medical Record Number: [id number] Patient Account Number: [id number] Date of Birth/Sex: Treating RN: [birth year] (52 y.o. F) Primary Care Gerrald Basu: Alvester Chou Other Clinician: Referring Iris Tatsch: Treating Zinia Innocent/Extender: [name] in Treatment: 88 Wound Status Wound Number: 1 Primary Etiology: Pressure Ulcer Wound Location: Right Gluteus Wound Status: Open Wounding Event: Pressure Injury Comorbid History: Hypertension, Osteomyelitis, Paraplegia Date Acquired: 12/09/2020 Weeks Of Treatment: 57 Clustered Wound: No Photos Wound Measurements Length: (cm) 4.5 Width: (cm) 4.5 Depth: (cm) 4 Area: (cm) 15.904 Volume: (cm) 63.617 % Reduction in Area: 10% % Reduction in Volume: 25% Epithelialization: None Wound Description Classification: Category/Stage IV Wound Margin: Well defined, not attached Exudate Amount: Medium Exudate Type: Serosanguineous Exudate Color: red, brown [name], [name] ([id number]) Wound Bed Granulation Amount: Large (67-100% Granulation Quality: Red, Pink, Fri Necrotic Amount: None Present ( Foul Odor After Cleansing: No Slough/Fibrino No [id number] Page 8 of 9 ) Exposed Structure able Fascia Exposed:  No 0%) Fat Layer (Subcutaneous Tissue) Exposed: Yes Tendon Exposed: No Muscle Exposed: No Joint Exposed: No Bone Exposed: No Periwound Skin Texture Texture Color No Abnormalities Noted: No No Abnormalities Noted: No Callus: No Atrophie Blanche: No Crepitus: No Cyanosis: No Excoriation: No Ecchymosis: No Induration: No Erythema: No Rash: No Hemosiderin Staining: No Scarring: No Mottled: No Pallor: No Moisture Rubor: No No Abnormalities Noted: No Dry / Scaly: No Maceration: No Treatment Notes Wound #1 (Gluteus) Wound Laterality: Right Cleanser Vashe 5.8 (oz) Discharge Instruction: Cleanse the wound with Vashe prior to applying a clean dressing using gauze sponges, not tissue or cotton balls. Peri-Wound Care Topical Primary Dressing Sorbalgon Dressing 4x4 (in/in) Discharge Instruction: Apply to wound bed as instructed Secondary Dressing Zetuvit Plus Silicone Border Sacrum Dressing, Sm, 7x7 (in/in) Discharge Instruction: Apply silicone border over primary dressing as directed. Secured With Compression Wrap Compression Stockings Environmental education officer) Signed: 04/30/2022 4:38:15 PM By: Erenest Blank Entered By: Erenest Blank on 04/29/2022 10:24:53 -------------------------------------------------------------------------------- Vitals Details Patient Name: Date of Service: [name], [name]. 04/29/2022 10:15 A M Medical Record Number: [id number] Patient Account Number: [id number] Date of Birth/Sex: Treating RN: 12-12-1970 (52 y.o. F) Primary Care Charla Criscione: Alvester Chou Other Clinician: Referring [name]: Treating Ibrahem Volkman/Extender: [name] in Treatment: 64 Vital Signs Time Taken: 10:17 Temperature (F): 98 Height (in): 62 Pulse (bpm): [place], Chyler [name] ([id number]) [phone].pdf Page 9 of 9 Weight (lbs): 145 Respiratory Rate (breaths/min): 18 Body Mass Index (BMI): 26.5 Blood Pressure (mmHg):  128/81 Reference Range: 80 - 120 mg / dl Electronic Signature(s) Signed: 04/30/2022 4:38:15 PM By: Erenest Blank Entered By: Erenest Blank on 04/29/2022 10:17:54

## 2022-05-02 DIAGNOSIS — E440 Moderate protein-calorie malnutrition: Secondary | ICD-10-CM | POA: Diagnosis not present

## 2022-05-02 DIAGNOSIS — N200 Calculus of kidney: Secondary | ICD-10-CM | POA: Diagnosis not present

## 2022-05-02 DIAGNOSIS — I10 Essential (primary) hypertension: Secondary | ICD-10-CM | POA: Diagnosis not present

## 2022-05-02 DIAGNOSIS — G8220 Paraplegia, unspecified: Secondary | ICD-10-CM | POA: Diagnosis not present

## 2022-05-02 DIAGNOSIS — L89314 Pressure ulcer of right buttock, stage 4: Secondary | ICD-10-CM | POA: Diagnosis not present

## 2022-05-02 DIAGNOSIS — G35 Multiple sclerosis: Secondary | ICD-10-CM | POA: Diagnosis not present

## 2022-05-02 DIAGNOSIS — G8929 Other chronic pain: Secondary | ICD-10-CM | POA: Diagnosis not present

## 2022-05-02 DIAGNOSIS — N8184 Pelvic muscle wasting: Secondary | ICD-10-CM | POA: Diagnosis not present

## 2022-05-02 DIAGNOSIS — I700 Atherosclerosis of aorta: Secondary | ICD-10-CM | POA: Diagnosis not present

## 2022-05-06 DIAGNOSIS — L89314 Pressure ulcer of right buttock, stage 4: Secondary | ICD-10-CM | POA: Diagnosis not present

## 2022-05-06 DIAGNOSIS — I700 Atherosclerosis of aorta: Secondary | ICD-10-CM | POA: Diagnosis not present

## 2022-05-06 DIAGNOSIS — G35 Multiple sclerosis: Secondary | ICD-10-CM | POA: Diagnosis not present

## 2022-05-06 DIAGNOSIS — N200 Calculus of kidney: Secondary | ICD-10-CM | POA: Diagnosis not present

## 2022-05-06 DIAGNOSIS — G8929 Other chronic pain: Secondary | ICD-10-CM | POA: Diagnosis not present

## 2022-05-06 DIAGNOSIS — N8184 Pelvic muscle wasting: Secondary | ICD-10-CM | POA: Diagnosis not present

## 2022-05-06 DIAGNOSIS — I10 Essential (primary) hypertension: Secondary | ICD-10-CM | POA: Diagnosis not present

## 2022-05-06 DIAGNOSIS — E440 Moderate protein-calorie malnutrition: Secondary | ICD-10-CM | POA: Diagnosis not present

## 2022-05-06 DIAGNOSIS — G8220 Paraplegia, unspecified: Secondary | ICD-10-CM | POA: Diagnosis not present

## 2022-05-09 DIAGNOSIS — I700 Atherosclerosis of aorta: Secondary | ICD-10-CM | POA: Diagnosis not present

## 2022-05-09 DIAGNOSIS — G35 Multiple sclerosis: Secondary | ICD-10-CM | POA: Diagnosis not present

## 2022-05-09 DIAGNOSIS — E440 Moderate protein-calorie malnutrition: Secondary | ICD-10-CM | POA: Diagnosis not present

## 2022-05-09 DIAGNOSIS — G8220 Paraplegia, unspecified: Secondary | ICD-10-CM | POA: Diagnosis not present

## 2022-05-09 DIAGNOSIS — N200 Calculus of kidney: Secondary | ICD-10-CM | POA: Diagnosis not present

## 2022-05-09 DIAGNOSIS — G8929 Other chronic pain: Secondary | ICD-10-CM | POA: Diagnosis not present

## 2022-05-09 DIAGNOSIS — I10 Essential (primary) hypertension: Secondary | ICD-10-CM | POA: Diagnosis not present

## 2022-05-09 DIAGNOSIS — L89314 Pressure ulcer of right buttock, stage 4: Secondary | ICD-10-CM | POA: Diagnosis not present

## 2022-05-09 DIAGNOSIS — N8184 Pelvic muscle wasting: Secondary | ICD-10-CM | POA: Diagnosis not present

## 2022-05-13 DIAGNOSIS — I10 Essential (primary) hypertension: Secondary | ICD-10-CM | POA: Diagnosis not present

## 2022-05-13 DIAGNOSIS — N8184 Pelvic muscle wasting: Secondary | ICD-10-CM | POA: Diagnosis not present

## 2022-05-13 DIAGNOSIS — L89314 Pressure ulcer of right buttock, stage 4: Secondary | ICD-10-CM | POA: Diagnosis not present

## 2022-05-13 DIAGNOSIS — N200 Calculus of kidney: Secondary | ICD-10-CM | POA: Diagnosis not present

## 2022-05-13 DIAGNOSIS — G8929 Other chronic pain: Secondary | ICD-10-CM | POA: Diagnosis not present

## 2022-05-13 DIAGNOSIS — I700 Atherosclerosis of aorta: Secondary | ICD-10-CM | POA: Diagnosis not present

## 2022-05-13 DIAGNOSIS — G35 Multiple sclerosis: Secondary | ICD-10-CM | POA: Diagnosis not present

## 2022-05-13 DIAGNOSIS — E440 Moderate protein-calorie malnutrition: Secondary | ICD-10-CM | POA: Diagnosis not present

## 2022-05-13 DIAGNOSIS — G8220 Paraplegia, unspecified: Secondary | ICD-10-CM | POA: Diagnosis not present

## 2022-05-17 DIAGNOSIS — I700 Atherosclerosis of aorta: Secondary | ICD-10-CM | POA: Diagnosis not present

## 2022-05-17 DIAGNOSIS — G8929 Other chronic pain: Secondary | ICD-10-CM | POA: Diagnosis not present

## 2022-05-17 DIAGNOSIS — G35 Multiple sclerosis: Secondary | ICD-10-CM | POA: Diagnosis not present

## 2022-05-17 DIAGNOSIS — N8184 Pelvic muscle wasting: Secondary | ICD-10-CM | POA: Diagnosis not present

## 2022-05-17 DIAGNOSIS — G8220 Paraplegia, unspecified: Secondary | ICD-10-CM | POA: Diagnosis not present

## 2022-05-17 DIAGNOSIS — L89314 Pressure ulcer of right buttock, stage 4: Secondary | ICD-10-CM | POA: Diagnosis not present

## 2022-05-17 DIAGNOSIS — E440 Moderate protein-calorie malnutrition: Secondary | ICD-10-CM | POA: Diagnosis not present

## 2022-05-17 DIAGNOSIS — I10 Essential (primary) hypertension: Secondary | ICD-10-CM | POA: Diagnosis not present

## 2022-05-17 DIAGNOSIS — N200 Calculus of kidney: Secondary | ICD-10-CM | POA: Diagnosis not present

## 2022-05-20 DIAGNOSIS — N8184 Pelvic muscle wasting: Secondary | ICD-10-CM | POA: Diagnosis not present

## 2022-05-20 DIAGNOSIS — I10 Essential (primary) hypertension: Secondary | ICD-10-CM | POA: Diagnosis not present

## 2022-05-20 DIAGNOSIS — G8220 Paraplegia, unspecified: Secondary | ICD-10-CM | POA: Diagnosis not present

## 2022-05-20 DIAGNOSIS — G8929 Other chronic pain: Secondary | ICD-10-CM | POA: Diagnosis not present

## 2022-05-20 DIAGNOSIS — I700 Atherosclerosis of aorta: Secondary | ICD-10-CM | POA: Diagnosis not present

## 2022-05-20 DIAGNOSIS — L89314 Pressure ulcer of right buttock, stage 4: Secondary | ICD-10-CM | POA: Diagnosis not present

## 2022-05-20 DIAGNOSIS — G35 Multiple sclerosis: Secondary | ICD-10-CM | POA: Diagnosis not present

## 2022-05-20 DIAGNOSIS — E440 Moderate protein-calorie malnutrition: Secondary | ICD-10-CM | POA: Diagnosis not present

## 2022-05-20 DIAGNOSIS — N200 Calculus of kidney: Secondary | ICD-10-CM | POA: Diagnosis not present

## 2022-05-23 DIAGNOSIS — I10 Essential (primary) hypertension: Secondary | ICD-10-CM | POA: Diagnosis not present

## 2022-05-23 DIAGNOSIS — N200 Calculus of kidney: Secondary | ICD-10-CM | POA: Diagnosis not present

## 2022-05-23 DIAGNOSIS — G8929 Other chronic pain: Secondary | ICD-10-CM | POA: Diagnosis not present

## 2022-05-23 DIAGNOSIS — G8220 Paraplegia, unspecified: Secondary | ICD-10-CM | POA: Diagnosis not present

## 2022-05-23 DIAGNOSIS — E440 Moderate protein-calorie malnutrition: Secondary | ICD-10-CM | POA: Diagnosis not present

## 2022-05-23 DIAGNOSIS — G35 Multiple sclerosis: Secondary | ICD-10-CM | POA: Diagnosis not present

## 2022-05-23 DIAGNOSIS — N8184 Pelvic muscle wasting: Secondary | ICD-10-CM | POA: Diagnosis not present

## 2022-05-23 DIAGNOSIS — I700 Atherosclerosis of aorta: Secondary | ICD-10-CM | POA: Diagnosis not present

## 2022-05-23 DIAGNOSIS — L89314 Pressure ulcer of right buttock, stage 4: Secondary | ICD-10-CM | POA: Diagnosis not present

## 2022-05-27 DIAGNOSIS — I10 Essential (primary) hypertension: Secondary | ICD-10-CM | POA: Diagnosis not present

## 2022-05-27 DIAGNOSIS — G8929 Other chronic pain: Secondary | ICD-10-CM | POA: Diagnosis not present

## 2022-05-27 DIAGNOSIS — N8184 Pelvic muscle wasting: Secondary | ICD-10-CM | POA: Diagnosis not present

## 2022-05-27 DIAGNOSIS — N200 Calculus of kidney: Secondary | ICD-10-CM | POA: Diagnosis not present

## 2022-05-27 DIAGNOSIS — G35 Multiple sclerosis: Secondary | ICD-10-CM | POA: Diagnosis not present

## 2022-05-27 DIAGNOSIS — G8220 Paraplegia, unspecified: Secondary | ICD-10-CM | POA: Diagnosis not present

## 2022-05-27 DIAGNOSIS — E440 Moderate protein-calorie malnutrition: Secondary | ICD-10-CM | POA: Diagnosis not present

## 2022-05-27 DIAGNOSIS — I700 Atherosclerosis of aorta: Secondary | ICD-10-CM | POA: Diagnosis not present

## 2022-05-27 DIAGNOSIS — L89314 Pressure ulcer of right buttock, stage 4: Secondary | ICD-10-CM | POA: Diagnosis not present

## 2022-06-03 DIAGNOSIS — E440 Moderate protein-calorie malnutrition: Secondary | ICD-10-CM | POA: Diagnosis not present

## 2022-06-03 DIAGNOSIS — N8184 Pelvic muscle wasting: Secondary | ICD-10-CM | POA: Diagnosis not present

## 2022-06-03 DIAGNOSIS — G8220 Paraplegia, unspecified: Secondary | ICD-10-CM | POA: Diagnosis not present

## 2022-06-03 DIAGNOSIS — G8929 Other chronic pain: Secondary | ICD-10-CM | POA: Diagnosis not present

## 2022-06-03 DIAGNOSIS — N200 Calculus of kidney: Secondary | ICD-10-CM | POA: Diagnosis not present

## 2022-06-03 DIAGNOSIS — L89314 Pressure ulcer of right buttock, stage 4: Secondary | ICD-10-CM | POA: Diagnosis not present

## 2022-06-03 DIAGNOSIS — G35 Multiple sclerosis: Secondary | ICD-10-CM | POA: Diagnosis not present

## 2022-06-03 DIAGNOSIS — I10 Essential (primary) hypertension: Secondary | ICD-10-CM | POA: Diagnosis not present

## 2022-06-03 DIAGNOSIS — I700 Atherosclerosis of aorta: Secondary | ICD-10-CM | POA: Diagnosis not present

## 2022-06-11 DIAGNOSIS — G8929 Other chronic pain: Secondary | ICD-10-CM | POA: Diagnosis not present

## 2022-06-11 DIAGNOSIS — N200 Calculus of kidney: Secondary | ICD-10-CM | POA: Diagnosis not present

## 2022-06-11 DIAGNOSIS — G35 Multiple sclerosis: Secondary | ICD-10-CM | POA: Diagnosis not present

## 2022-06-11 DIAGNOSIS — L89314 Pressure ulcer of right buttock, stage 4: Secondary | ICD-10-CM | POA: Diagnosis not present

## 2022-06-11 DIAGNOSIS — I10 Essential (primary) hypertension: Secondary | ICD-10-CM | POA: Diagnosis not present

## 2022-06-11 DIAGNOSIS — N8184 Pelvic muscle wasting: Secondary | ICD-10-CM | POA: Diagnosis not present

## 2022-06-11 DIAGNOSIS — E440 Moderate protein-calorie malnutrition: Secondary | ICD-10-CM | POA: Diagnosis not present

## 2022-06-11 DIAGNOSIS — I700 Atherosclerosis of aorta: Secondary | ICD-10-CM | POA: Diagnosis not present

## 2022-06-11 DIAGNOSIS — G8220 Paraplegia, unspecified: Secondary | ICD-10-CM | POA: Diagnosis not present

## 2022-06-17 DIAGNOSIS — N200 Calculus of kidney: Secondary | ICD-10-CM | POA: Diagnosis not present

## 2022-06-17 DIAGNOSIS — N8184 Pelvic muscle wasting: Secondary | ICD-10-CM | POA: Diagnosis not present

## 2022-06-17 DIAGNOSIS — I10 Essential (primary) hypertension: Secondary | ICD-10-CM | POA: Diagnosis not present

## 2022-06-17 DIAGNOSIS — L89314 Pressure ulcer of right buttock, stage 4: Secondary | ICD-10-CM | POA: Diagnosis not present

## 2022-06-17 DIAGNOSIS — G35 Multiple sclerosis: Secondary | ICD-10-CM | POA: Diagnosis not present

## 2022-06-17 DIAGNOSIS — G8929 Other chronic pain: Secondary | ICD-10-CM | POA: Diagnosis not present

## 2022-06-17 DIAGNOSIS — G8220 Paraplegia, unspecified: Secondary | ICD-10-CM | POA: Diagnosis not present

## 2022-06-17 DIAGNOSIS — I700 Atherosclerosis of aorta: Secondary | ICD-10-CM | POA: Diagnosis not present

## 2022-06-17 DIAGNOSIS — E440 Moderate protein-calorie malnutrition: Secondary | ICD-10-CM | POA: Diagnosis not present

## 2022-06-24 DIAGNOSIS — E440 Moderate protein-calorie malnutrition: Secondary | ICD-10-CM | POA: Diagnosis not present

## 2022-06-24 DIAGNOSIS — G8220 Paraplegia, unspecified: Secondary | ICD-10-CM | POA: Diagnosis not present

## 2022-06-24 DIAGNOSIS — N8184 Pelvic muscle wasting: Secondary | ICD-10-CM | POA: Diagnosis not present

## 2022-06-24 DIAGNOSIS — G35 Multiple sclerosis: Secondary | ICD-10-CM | POA: Diagnosis not present

## 2022-06-24 DIAGNOSIS — I10 Essential (primary) hypertension: Secondary | ICD-10-CM | POA: Diagnosis not present

## 2022-06-24 DIAGNOSIS — L89314 Pressure ulcer of right buttock, stage 4: Secondary | ICD-10-CM | POA: Diagnosis not present

## 2022-06-24 DIAGNOSIS — I700 Atherosclerosis of aorta: Secondary | ICD-10-CM | POA: Diagnosis not present

## 2022-06-24 DIAGNOSIS — N200 Calculus of kidney: Secondary | ICD-10-CM | POA: Diagnosis not present

## 2022-06-24 DIAGNOSIS — G8929 Other chronic pain: Secondary | ICD-10-CM | POA: Diagnosis not present

## 2022-07-01 DIAGNOSIS — G8220 Paraplegia, unspecified: Secondary | ICD-10-CM | POA: Diagnosis not present

## 2022-07-01 DIAGNOSIS — G35 Multiple sclerosis: Secondary | ICD-10-CM | POA: Diagnosis not present

## 2022-07-01 DIAGNOSIS — E440 Moderate protein-calorie malnutrition: Secondary | ICD-10-CM | POA: Diagnosis not present

## 2022-07-01 DIAGNOSIS — I700 Atherosclerosis of aorta: Secondary | ICD-10-CM | POA: Diagnosis not present

## 2022-07-01 DIAGNOSIS — N8184 Pelvic muscle wasting: Secondary | ICD-10-CM | POA: Diagnosis not present

## 2022-07-01 DIAGNOSIS — I10 Essential (primary) hypertension: Secondary | ICD-10-CM | POA: Diagnosis not present

## 2022-07-01 DIAGNOSIS — N200 Calculus of kidney: Secondary | ICD-10-CM | POA: Diagnosis not present

## 2022-07-01 DIAGNOSIS — L89314 Pressure ulcer of right buttock, stage 4: Secondary | ICD-10-CM | POA: Diagnosis not present

## 2022-07-01 DIAGNOSIS — G8929 Other chronic pain: Secondary | ICD-10-CM | POA: Diagnosis not present

## 2022-07-08 DIAGNOSIS — I10 Essential (primary) hypertension: Secondary | ICD-10-CM | POA: Diagnosis not present

## 2022-07-08 DIAGNOSIS — G35 Multiple sclerosis: Secondary | ICD-10-CM | POA: Diagnosis not present

## 2022-07-08 DIAGNOSIS — E440 Moderate protein-calorie malnutrition: Secondary | ICD-10-CM | POA: Diagnosis not present

## 2022-07-08 DIAGNOSIS — G8220 Paraplegia, unspecified: Secondary | ICD-10-CM | POA: Diagnosis not present

## 2022-07-08 DIAGNOSIS — N200 Calculus of kidney: Secondary | ICD-10-CM | POA: Diagnosis not present

## 2022-07-08 DIAGNOSIS — G8929 Other chronic pain: Secondary | ICD-10-CM | POA: Diagnosis not present

## 2022-07-08 DIAGNOSIS — L89314 Pressure ulcer of right buttock, stage 4: Secondary | ICD-10-CM | POA: Diagnosis not present

## 2022-07-08 DIAGNOSIS — I700 Atherosclerosis of aorta: Secondary | ICD-10-CM | POA: Diagnosis not present

## 2022-07-08 DIAGNOSIS — N8184 Pelvic muscle wasting: Secondary | ICD-10-CM | POA: Diagnosis not present

## 2022-07-22 ENCOUNTER — Encounter (HOSPITAL_BASED_OUTPATIENT_CLINIC_OR_DEPARTMENT_OTHER): Payer: Medicare PPO | Attending: Internal Medicine | Admitting: Internal Medicine

## 2022-07-22 DIAGNOSIS — G8220 Paraplegia, unspecified: Secondary | ICD-10-CM | POA: Diagnosis not present

## 2022-07-22 DIAGNOSIS — M4628 Osteomyelitis of vertebra, sacral and sacrococcygeal region: Secondary | ICD-10-CM

## 2022-07-22 DIAGNOSIS — I10 Essential (primary) hypertension: Secondary | ICD-10-CM | POA: Insufficient documentation

## 2022-07-22 DIAGNOSIS — G35 Multiple sclerosis: Secondary | ICD-10-CM | POA: Diagnosis not present

## 2022-07-22 DIAGNOSIS — Z87891 Personal history of nicotine dependence: Secondary | ICD-10-CM | POA: Diagnosis not present

## 2022-07-22 DIAGNOSIS — L89314 Pressure ulcer of right buttock, stage 4: Secondary | ICD-10-CM

## 2022-08-20 NOTE — Progress Notes (Signed)
KENSLY, FARIN ([id number]) [phone].pdf Page 1 of 6 Visit Report for 07/22/2022 Chief Complaint Document Details Patient Name: Date of Service: [name]. 07/22/2022 10:15 A M Medical Record Number: [id number] Patient Account Number: [id number] Date of Birth/Sex: Treating RN: February 28, 1971 (52 y.o. Female) Primary Care Provider: Marletta Lor Other Clinician: Referring Provider: Treating Provider/Extender: Stasia Cavalier in Treatment: 82 Information Obtained from: Patient Chief Complaint 03/26/21; Right gluteus wound Electronic Signature(s) Signed: 07/22/2022 2:08:31 PM By: Geralyn Corwin DO Entered By: Geralyn Corwin on 07/22/2022 10:57:24 -------------------------------------------------------------------------------- HPI Details Patient Name: Date of Service: [name]. 07/22/2022 10:15 A M Medical Record Number: [id number] Patient Account Number: [id number] Date of Birth/Sex: Treating RN: March 25, 1970 (52 y.o. Female) Primary Care Provider: Marletta Lor Other Clinician: Referring Provider: Treating Provider/Extender: Stasia Cavalier in Treatment: 44 History of Present Illness HPI Description: Admission 03/26/2021 [name] is a 52 year old female with a past medical history of multiple sclerosis for the past 16 years, complicated by severe debility and bedbound since 2015 that presents to the clinic for a 27-month history of ulcer to the right buttocks. She has been admitted to the hospital several times for this issue. She was last hospitalized on 02/18/2021 for septic shock due to the infected ulcer. She was evaluated by infectious disease at that time. She completed a 2- week course of antibiotics. There is no plan for follow-up. She is currently using calcium alginate dressings. She has home health that comes out twice weekly. She currently denies signs of infection. She has an air mattress for  offloading. Of note She has a history of osteomyelitis to the sacrum and treated in 2020 with IV antibiotics for 8 weeks. 3/21; patient presents for follow-up. She has been using calcium alginate to the wound bed and using Vashe to help clean prior to dressing changes. She has no issues or complaints today. 5/16; patient presents for follow-up. She has been using calcium alginate to the wound bed. She has no issues or complaints today. She is not able to aggressively offload the area due to her multiple sclerosis. 7/18; patient presents for follow-up. She had a televisit with Dr. Luciana Axe on 08/03/2021 for possible osteomyelitis noted on x-ray. She was started on doxycycline and Levaquin and completed her 4-week course. She has been using calcium alginate to the wound bed. She has no issues or complaints today. 11/27; patient missed her last clinic appointment. She was last seen 4 months ago. She has a chronic wound to her right buttocks that has been treated for osteomyelitis. She has been using calcium alginate to the wound bed. She uses a wound cleanser in between dressing changes. She tries to aggressively offload the area. She states that due to cost she cannot come in frequently. 2/19; patient presents for follow-up. She follows in our clinic every 2 to 3 months for her chronic osteomyelitis sacral wound. She has been using calcium alginate and cleaning the area with Vashe. She has no issues or complaints today. She states she would like to follow in our clinic every 3 months and cannot do so sooner due to cost of transportation. 5/13; patient presents for follow-up. She follows in our clinic every 3 months for chronic osteomyelitis to the sacrum. She states she was just recently released by home health and would like to try and reestablish care with a new agency. She has been using calcium alginate to the wound bed. She has been cleaning it  with Vashe. Electronic Signature(s) Signed: 07/22/2022  2:08:31 PM By: Geralyn Corwin DO Entered By: Geralyn Corwin on 07/22/2022 10:58:47 Physical Exam Details -------------------------------------------------------------------------------- [name] ([id number]) [id number] Page 2 of 6 Patient Name: Date of Service: Julia LLA RD, [name]. 07/22/2022 10:15 A M Medical Record Number: [id number] Patient Account Number: [id number] Date of Birth/Sex: Treating RN: [birth year] (52 y.o. Female) Primary Care Provider: Marletta Lor Other Clinician: Referring Provider: Treating Provider/Extender: Stasia Cavalier in Treatment: 74 Constitutional respirations regular, non-labored and within target range for patient.Marland Kitchen Psychiatric pleasant and cooperative. Notes Right buttocks: Open wound with granulation tissue and undermining. No bone exposed. Surrounding skin intact. No rashes. No signs of surrounding soft tissue infection. Electronic Signature(s) Signed: 07/22/2022 2:08:31 PM By: Geralyn Corwin DO Entered By: Geralyn Corwin on 07/22/2022 10:59:14 -------------------------------------------------------------------------------- Physician Orders Details Patient Name: Date of Service: Julia LLA RD, [name]. 07/22/2022 10:15 A M Medical Record Number: [id number] Patient Account Number: [id number] Date of Birth/Sex: Treating RN: [birth year] (52 y.o. Female) Brenton Grills Primary Care Provider: Marletta Lor Other Clinician: Referring Provider: Treating Provider/Extender: Stasia Cavalier in Treatment: 51 Verbal / Phone Orders: No Diagnosis Coding Follow-up Appointments ppointment in: - 3 months follow up with Dr. Mikey Bussing ******STRETCHER ROOM***** Return A patient may speak with primary to continue orders for wound care and home health due to cost of wound care trips for appts. Off-Loading Low air-loss mattress (Group 2) Turn and reposition every 2 hours Home Health No change in  wound care orders this week; continue Home Health for wound care. May utilize formulary equivalent dressing for wound treatment orders unless otherwise specified. Other Home Health Orders/Instructions: - [facility] Wound Treatment Wound #1 - Gluteus Wound Laterality: Right Cleanser: Vashe 5.8 (oz) (Home Health) 1 x Per Day/Other:3 months Discharge Instructions: Cleanse the wound with Vashe prior to applying a clean dressing using gauze sponges, not tissue or cotton balls. Prim Dressing: Sorbalgon Dressing 4x4 (in/in) (Home Health) 1 x Per Day/Other:3 months ary Discharge Instructions: Apply to wound bed as instructed Secondary Dressing: Woven Gauze Sponge, Non-Sterile 4x4 in (Home Health) 1 x Per Day/Other:3 months Discharge Instructions: Apply over primary dressing as directed. Secondary Dressing: Zetuvit Plus Silicone Border Sacrum Dressing, Sm, 7x7 (in/in) (Home Health) 1 x Per Day/Other:3 months Discharge Instructions: Apply silicone border over primary dressing as directed. Electronic Signature(s) Signed: 07/22/2022 2:08:31 PM By: Geralyn Corwin DO Entered By: Geralyn Corwin on 07/22/2022 10:59:22 Problem List Details -------------------------------------------------------------------------------- [name] ([id number]) [id number] Page 3 of 6 Patient Name: Date of Service: Julia LLA RD, [name]. 07/22/2022 10:15 A M Medical Record Number: [id number] Patient Account Number: [id number] Date of Birth/Sex: Treating RN: 01-03-1971 (52 y.o. Female) Brenton Grills Primary Care Provider: Marletta Lor Other Clinician: Referring Provider: Treating Provider/Extender: Stasia Cavalier in Treatment: 18 Active Problems ICD-10 Encounter Code Description Active Date MDM Diagnosis L89.314 Pressure ulcer of right buttock, stage 4 03/26/2021 No Yes G35 Multiple sclerosis 03/26/2021 No Yes M46.28 Osteomyelitis of vertebra, sacral and sacrococcygeal  region 03/26/2021 No Yes Inactive Problems Resolved Problems Electronic Signature(s) Signed: 07/22/2022 2:08:31 PM By: Geralyn Corwin DO Entered By: Geralyn Corwin on 07/22/2022 10:56:53 -------------------------------------------------------------------------------- Progress Note Details Patient Name: Date of Service: Julia LLA RD, Zion [name]. 07/22/2022 10:15 A M Medical Record Number: [id number] Patient Account Number: [id number] Date of Birth/Sex: Treating RN: [birth year] (52 y.o. Female) Primary Care Provider: Marletta Lor Other Clinician: Referring Provider: Treating Provider/Extender: Stasia Cavalier in Treatment: 61 Subjective  Chief Complaint Information obtained from Patient 03/26/21; Right gluteus wound History of Present Illness (HPI) Admission 03/26/2021 Ms. Kameesha Westra is a 52 year old female with a past medical history of multiple sclerosis for the past 16 years, complicated by severe debility and bedbound since 2015 that presents to the clinic for a 53-month history of ulcer to the right buttocks. She has been admitted to the hospital several times for this issue. She was last hospitalized on 02/18/2021 for septic shock due to the infected ulcer. She was evaluated by infectious disease at that time. She completed a 2- week course of antibiotics. There is no plan for follow-up. She is currently using calcium alginate dressings. She has home health that comes out twice weekly. She currently denies signs of infection. She has an air mattress for offloading. Of note She has a history of osteomyelitis to the sacrum and treated in 2020 with IV antibiotics for 8 weeks. 3/21; patient presents for follow-up. She has been using calcium alginate to the wound bed and using Vashe to help clean prior to dressing changes. She has no issues or complaints today. 5/16; patient presents for follow-up. She has been using calcium alginate to the wound bed. She has no issues or  complaints today. She is not able to aggressively offload the area due to her multiple sclerosis. 7/18; patient presents for follow-up. She had a televisit with Dr. Luciana Axe on 08/03/2021 for possible osteomyelitis noted on x-ray. She was started on doxycycline and Levaquin and completed her 4-week course. She has been using calcium alginate to the wound bed. She has no issues or complaints today. 11/27; patient missed her last clinic appointment. She was last seen 4 months ago. She has a chronic wound to her right buttocks that has been treated for osteomyelitis. She has been using calcium alginate to the wound bed. She uses a wound cleanser in between dressing changes. She tries to aggressively offload the area. She states that due to cost she cannot come in frequently. 2/19; patient presents for follow-up. She follows in our clinic every 2 to 3 months for her chronic osteomyelitis sacral wound. She has been using calcium alginate and cleaning the area with Vashe. She has no issues or complaints today. She states she would like to follow in our clinic every 3 months and cannot do so sooner due to cost of transportation. 5/13; patient presents for follow-up. She follows in our clinic every 3 months for chronic osteomyelitis to the sacrum. She states she was just recently [name], [name] ([id number]) [id number].pdf Page 4 of 6 released by home health and would like to try and reestablish care with a new agency. She has been using calcium alginate to the wound bed. She has been cleaning it with Vashe. Patient History Information obtained from Patient. Family History Unknown History. Social History Former smoker, Marital Status - Single, Alcohol Use - Never, Drug Use - No History, Caffeine Use - Rarely. Medical History Cardiovascular Patient has history of Hypertension Denies history of Deep Vein Thrombosis Integumentary (Skin) Denies history of History of  Burn Musculoskeletal Patient has history of Osteomyelitis - right ischial tuberosity Neurologic Patient has history of Paraplegia Medical A Surgical History Notes nd Neurologic Multiple Sclerosis Objective Constitutional respirations regular, non-labored and within target range for patient.. Vitals Time Taken: 10:33 AM, Height: 62 in, Weight: 145 lbs, BMI: 26.5, Temperature: 98.0 F, Pulse: 71 bpm, Respiratory Rate: 18 breaths/min, Blood Pressure: 114/69 mmHg. Psychiatric pleasant and cooperative. General Notes: Right buttocks: Open wound  with granulation tissue and undermining. No bone exposed. Surrounding skin intact. No rashes. No signs of surrounding soft tissue infection. Integumentary (Hair, Skin) Wound #1 status is Open. Original cause of wound was Pressure Injury. The date acquired was: 12/09/2020. The wound has been in treatment 69 weeks. The wound is located on the Right Gluteus. The wound measures 4cm length x 4cm width x 4cm depth; 12.566cm^2 area and 50.265cm^3 volume. There is Fat Layer (Subcutaneous Tissue) exposed. There is no tunneling or undermining noted. There is a medium amount of serosanguineous drainage noted. The wound margin is well defined and not attached to the wound base. There is large (67-100%) red, pink, friable granulation within the wound bed. There is no necrotic tissue within the wound bed. The periwound skin appearance did not exhibit: Callus, Crepitus, Excoriation, Induration, Rash, Scarring, Dry/Scaly, Maceration, Atrophie Blanche, Cyanosis, Ecchymosis, Hemosiderin Staining, Mottled, Pallor, Rubor, Erythema. Assessment Active Problems ICD-10 Pressure ulcer of right buttock, stage 4 Multiple sclerosis Osteomyelitis of vertebra, sacral and sacrococcygeal region Patient's wound is smaller today. I recommended aggressive offloading and continuing with calcium alginate. Unfortunately she has been released by home health. We will see if her insurance  will cover another agency. For now we will order her supplies. Due to transportation cost patient would like to be seen every 3 months. She knows to call with any questions or concerns. Plan Follow-up Appointments: Return Appointment in: - 3 months follow up with Dr. Mikey Bussing ******STRETCHER ROOM***** patient may speak with primary to continue orders for wound [name], [name] ([id number]) [phone].pdf Page 5 of 6 care and home health due to cost of wound care trips for appts. Off-Loading: Low air-loss mattress (Group 2) Turn and reposition every 2 hours Home Health: No change in wound care orders this week; continue Home Health for wound care. May utilize formulary equivalent dressing for wound treatment orders unless otherwise specified. Other Home Health Orders/Instructions: - [facility] WOUND #1: - Gluteus Wound Laterality: Right Cleanser: Vashe 5.8 (oz) (Home Health) 1 x Per Day/Other:3 months Discharge Instructions: Cleanse the wound with Vashe prior to applying a clean dressing using gauze sponges, not tissue or cotton balls. Prim Dressing: Sorbalgon Dressing 4x4 (in/in) (Home Health) 1 x Per Day/Other:3 months ary Discharge Instructions: Apply to wound bed as instructed Secondary Dressing: Woven Gauze Sponge, Non-Sterile 4x4 in (Home Health) 1 x Per Day/Other:3 months Discharge Instructions: Apply over primary dressing as directed. Secondary Dressing: Zetuvit Plus Silicone Border Sacrum Dressing, Sm, 7x7 (in/in) (Home Health) 1 x Per Day/Other:3 months Discharge Instructions: Apply silicone border over primary dressing as directed. 1. Calcium alginate 2. Vashe 3. Aggressive offloading 4. Follow-up in 3 months Electronic Signature(s) Signed: 07/22/2022 2:08:31 PM By: Geralyn Corwin DO Entered By: Geralyn Corwin on 07/22/2022 11:05:18 -------------------------------------------------------------------------------- HxROS Details Patient Name: Date of  Service: Julia LLA RD, [name]. 07/22/2022 10:15 A M Medical Record Number: [id number] Patient Account Number: [id number] Date of Birth/Sex: Treating RN: October 19, 1970 (52 y.o. Female) Primary Care Provider: Marletta Lor Other Clinician: Referring Provider: Treating Provider/Extender: Stasia Cavalier in Treatment: 51 Information Obtained From Patient Cardiovascular Medical History: Positive for: Hypertension Negative for: Deep Vein Thrombosis Integumentary (Skin) Medical History: Negative for: History of Burn Musculoskeletal Medical History: Positive for: Osteomyelitis - right ischial tuberosity Neurologic Medical History: Positive for: Paraplegia Past Medical History Notes: Multiple Sclerosis Immunizations Pneumococcal Vaccine: Received Pneumococcal Vaccination: No Implantable Devices Yes Family and Social History Unknown History: Yes; Former smoker; Marital Status - Single; Alcohol Use: Never; Drug  Use: No History; Caffeine Use: Rarely; Financial Concerns: No; Food, Clothing or Shelter Needs: No; Support System Lacking: No; Transportation Concerns: No VANNATTER, [name] ([id number]) [phone].pdf Page 6 of 6 Electronic Signature(s) Signed: 07/22/2022 2:08:31 PM By: Geralyn Corwin DO Entered By: Geralyn Corwin on 07/22/2022 10:58:51 -------------------------------------------------------------------------------- SuperBill Details Patient Name: Date of Service: Julia LLA RD, Suellyn [name]. 07/22/2022 Medical Record Number: [id number] Patient Account Number: [id number] Date of Birth/Sex: Treating RN: 05/31/1970 (52 y.o. Female) Primary Care Provider: Marletta Lor Other Clinician: Referring Provider: Treating Provider/Extender: Stasia Cavalier in Treatment: 69 Diagnosis Coding ICD-10 Codes Code Description L89.314 Pressure ulcer of right buttock, stage 4 G35 Multiple sclerosis M46.28 Osteomyelitis of vertebra, sacral and  sacrococcygeal region Facility Procedures : CPT4 Code: [id number] Description: 99213 - WOUND CARE VISIT-LEV 3 EST PT Modifier: 25 Quantity: 1 Physician Procedures : CPT4 Code Description Modifier [id number] 99213 - WC PHYS LEVEL 3 - EST PT ICD-10 Diagnosis Description L89.314 Pressure ulcer of right buttock, stage 4 G35 Multiple sclerosis M46.28 Osteomyelitis of vertebra, sacral and sacrococcygeal region Quantity: 1 Electronic Signature(s) Signed: 07/22/2022 2:08:31 PM By: Geralyn Corwin DO Signed: 08/20/2022 7:49:05 AM By: Brenton Grills Entered By: Brenton Grills on 07/22/2022 11:08:00

## 2022-08-20 NOTE — Progress Notes (Signed)
[name], [name] ([id number]) [phone].pdf Page 1 of 8 Visit Report for 07/22/2022 Arrival Information Details Patient Name: Date of Service: [name], [name]. 07/22/2022 10:15 A M Medical Record Number: [id number] Patient Account Number: [id number] Date of Birth/Sex: Treating RN: 06-29-1970 (52 y.o. Female) Primary Care Regina Coppolino: Marletta Lor Other Clinician: Referring Joni Norrod: Treating Jameisha Stofko/Extender: Stasia Cavalier in Treatment: 17 Visit Information History Since Last Visit All ordered tests and consults were completed: No Patient Arrived: Stretcher Added or deleted any medications: No Arrival Time: 10:33 Any new allergies or adverse reactions: No Accompanied By: transport Had a fall or experienced change in No Transfer Assistance: Stretcher activities of daily living that may affect Patient Identification Verified: Yes risk of falls: Secondary Verification Process Completed: Yes Signs or symptoms of abuse/neglect since last visito No Patient Requires Transmission-Based Precautions: No Hospitalized since last visit: No Patient Has Alerts: No Implantable device outside of the clinic excluding No cellular tissue based products placed in the center since last visit: Pain Present Now: No Electronic Signature(s) Signed: 08/20/2022 7:49:05 AM By: Brenton Grills Entered By: Brenton Grills on 07/22/2022 10:42:59 -------------------------------------------------------------------------------- Clinic Level of Care Assessment Details Patient Name: Date of Service: [name], [name]. 07/22/2022 10:15 A M Medical Record Number: [id number] Patient Account Number: [id number] Date of Birth/Sex: Treating RN: [birth year] (52 y.o. Female) Brenton Grills Primary Care Blayre Papania: Marletta Lor Other Clinician: Referring Laurey Salser: Treating [name]/Extender: Stasia Cavalier in Treatment: 69 Clinic Level of Care Assessment Items TOOL  4 Quantity Score X- 1 0 Use when only an EandM is performed on FOLLOW-UP visit ASSESSMENTS - Nursing Assessment / Reassessment X- 1 10 Reassessment of Co-morbidities (includes updates in patient status) X- 1 5 Reassessment of Adherence to Treatment Plan ASSESSMENTS - Wound and Skin A ssessment / Reassessment X - Simple Wound Assessment / Reassessment - one wound 1 5 []  - 0 Complex Wound Assessment / Reassessment - multiple wounds []  - 0 Dermatologic / Skin Assessment (not related to wound area) ASSESSMENTS - Focused Assessment []  - 0 Circumferential Edema Measurements - multi extremities []  - 0 Nutritional Assessment / Counseling / Intervention []  - 0 Lower Extremity Assessment (monofilament, tuning fork, pulses) []  - 0 Peripheral Arterial Disease Assessment (using hand held doppler) ASSESSMENTS - Ostomy and/or Continence Assessment and Care []  - 0 Incontinence Assessment and Management []  - 0 Ostomy Care Assessment and Management (repouching, etc.) PROCESS - Coordination of Care X - Simple Patient / Family Education for ongoing care 1 15 Byers, [name] ([id number]) [phone].pdf Page 2 of 8 []  - 0 Complex (extensive) Patient / Family Education for ongoing care X- 1 10 Staff obtains Consents, Records, T Results / Process Orders est X- 1 10 Staff telephones HHA, Nursing Homes / Clarify orders / etc []  - 0 Routine Transfer to another Facility (non-emergent condition) []  - 0 Routine Hospital Admission (non-emergent condition) []  - 0 New Admissions / Manufacturing engineer / Ordering NPWT Apligraf, etc. , []  - 0 Emergency Hospital Admission (emergent condition) X- 1 10 Simple Discharge Coordination []  - 0 Complex (extensive) Discharge Coordination PROCESS - Special Needs []  - 0 Pediatric / Minor Patient Management []  - 0 Isolation Patient Management []  - 0 Hearing / Language / Visual special needs []  - 0 Assessment of Community  assistance (transportation, D/C planning, etc.) []  - 0 Additional assistance / Altered mentation []  - 0 Support Surface(s) Assessment (bed, cushion, seat, etc.) INTERVENTIONS - Wound Cleansing / Measurement X - Simple Wound  Cleansing - one wound 1 5 []  - 0 Complex Wound Cleansing - multiple wounds []  - 0 Wound Imaging (photographs - any number of wounds) []  - 0 Wound Tracing (instead of photographs) X- 1 5 Simple Wound Measurement - one wound []  - 0 Complex Wound Measurement - multiple wounds INTERVENTIONS - Wound Dressings []  - 0 Small Wound Dressing one or multiple wounds X- 1 15 Medium Wound Dressing one or multiple wounds []  - 0 Large Wound Dressing one or multiple wounds []  - 0 Application of Medications - topical []  - 0 Application of Medications - injection INTERVENTIONS - Miscellaneous []  - 0 External ear exam []  - 0 Specimen Collection (cultures, biopsies, blood, body fluids, etc.) []  - 0 Specimen(s) / Culture(s) sent or taken to Lab for analysis []  - 0 Patient Transfer (multiple staff / Nurse, adult / Similar devices) []  - 0 Simple Staple / Suture removal (25 or less) []  - 0 Complex Staple / Suture removal (26 or more) []  - 0 Hypo / Hyperglycemic Management (close monitor of Blood Glucose) []  - 0 Ankle / Brachial Index (ABI) - do not check if billed separately X- 1 5 Vital Signs Has the patient been seen at the hospital within the last three years: Yes Total Score: 95 Level Of Care: New/Established - Level 3 Electronic Signature(s) Signed: 08/20/2022 7:49:05 AM By: Brenton Grills Entered By: Brenton Grills on 07/22/2022 11:07:44 [name] ([id number]) [id number] Page 3 of 8 -------------------------------------------------------------------------------- Encounter Discharge Information Details Patient Name: Date of Service: [name], [name]. 07/22/2022 10:15 A M Medical Record Number: [id number] Patient Account Number:  [id number] Date of Birth/Sex: Treating RN: 1970-06-05 (52 y.o. Female) Brenton Grills Primary Care Shalita Notte: Marletta Lor Other Clinician: Referring Camry Theiss: Treating [name]/Extender: Stasia Cavalier in Treatment: 66 Encounter Discharge Information Items Discharge Condition: Stable Ambulatory Status: Stretcher Discharge Destination: Skilled Nursing Facility Telephoned: No Orders Sent: Yes Transportation: Private Auto Accompanied By: transport Schedule Follow-up Appointment: Yes Clinical Summary of Care: Patient Declined Electronic Signature(s) Signed: 08/20/2022 7:49:05 AM By: Brenton Grills Entered By: Brenton Grills on 07/22/2022 11:09:18 -------------------------------------------------------------------------------- Lower Extremity Assessment Details Patient Name: Date of Service: [name], [name]. 07/22/2022 10:15 A M Medical Record Number: [id number] Patient Account Number: [id number] Date of Birth/Sex: Treating RN: [birth year] (52 y.o. Female) Brenton Grills Primary Care Wynston Romey: Marletta Lor Other Clinician: Referring Adreonna Yontz: Treating Dailon Sheeran/Extender: Stasia Cavalier in Treatment: 64 Electronic Signature(s) Signed: 08/20/2022 7:49:05 AM By: Brenton Grills Entered By: Brenton Grills on 07/22/2022 10:43:51 -------------------------------------------------------------------------------- Multi Wound Chart Details Patient Name: Date of Service: [name], [name]. 07/22/2022 10:15 A M Medical Record Number: [id number] Patient Account Number: [id number] Date of Birth/Sex: Treating RN: 24-Jun-1970 (52 y.o. Female) Primary Care Johanny Segers: Marletta Lor Other Clinician: Referring Tahirah Sara: Treating Giannamarie Paulus/Extender: Stasia Cavalier in Treatment: 81 Vital Signs Height(in): 62 Pulse(bpm): 71 Weight(lbs): 145 Blood Pressure(mmHg): 114/69 Body Mass Index(BMI): 26.5 Temperature(F): 98.0 Respiratory Rate(breaths/min):  18 [1:Photos:] [N/A:N/A] Right Gluteus N/A N/A Wound Location: Pressure Injury N/A N/A Wounding Event: Pressure Ulcer N/A N/A Primary Etiology: Hypertension, Osteomyelitis, N/A N/A Comorbid History: Paraplegia 12/09/2020 N/A N/A Date Acquired: 12 N/A N/A Weeks of Treatment: Open N/A N/A Wound Status: No N/A N/A Wound Recurrence: 4x4x4 N/A N/A Measurements [name] x W x D (cm) 12.566 N/A N/A A (cm) : rea 50.265 N/A N/A Volume (cm) : 28.90% N/A N/A % Reduction in A rea: 40.70% N/A N/A % Reduction in Volume: Category/Stage IV N/A N/A  Classification: Medium N/A N/A Exudate A mount: Serosanguineous N/A N/A Exudate Type: red, brown N/A N/A Exudate Color: Well defined, not attached N/A N/A Wound Margin: Large (67-100%) N/A N/A Granulation A mount: Red, Pink, Friable N/A N/A Granulation Quality: None Present (0%) N/A N/A Necrotic A mount: Fat Layer (Subcutaneous Tissue): Yes N/A N/A Exposed Structures: Fascia: No Tendon: No Muscle: No Joint: No Bone: No None N/A N/A Epithelialization: Excoriation: No N/A N/A Periwound Skin Texture: Induration: No Callus: No Crepitus: No Rash: No Scarring: No Maceration: No N/A N/A Periwound Skin Moisture: Dry/Scaly: No Atrophie Blanche: No N/A N/A Periwound Skin Color: Cyanosis: No Ecchymosis: No Erythema: No Hemosiderin Staining: No Mottled: No Pallor: No Rubor: No Treatment Notes Electronic Signature(s) Signed: 07/22/2022 2:08:31 PM By: Geralyn Corwin DO Entered By: Geralyn Corwin on 07/22/2022 10:56:58 -------------------------------------------------------------------------------- Multi-Disciplinary Care Plan Details Patient Name: Date of Service: [name], [name]. 07/22/2022 10:15 A M Medical Record Number: [id number] Patient Account Number: [id number] Date of Birth/Sex: Treating RN: Jan 15, 1971 (52 y.o. Female) Brenton Grills Primary Care Tressie Ragin: Marletta Lor Other Clinician: Referring Elick Aguilera: Treating  Erby Sanderson/Extender: Stasia Cavalier in Treatment: 50 Multidisciplinary Care Plan reviewed with physician Active Inactive Nutrition Nursing Diagnoses: Potential for alteratiion in Nutrition/Potential for imbalanced nutrition Goals: Patient/caregiver agrees to and verbalizes understanding of need to use nutritional supplements and/or vitamins as prescribed Date Initiated: 03/26/2021 Target Resolution Date: 09/07/2022 Goal Status: Active [name], [name] ([id number]) [phone].pdf Page 5 of 8 Interventions: Assess patient nutrition upon admission and as needed per policy Provide education on nutrition Treatment Activities: Education provided on Nutrition : 09/25/2021 Notes: Pressure Nursing Diagnoses: Knowledge deficit related to causes and risk factors for pressure ulcer development Knowledge deficit related to management of pressures ulcers Potential for impaired tissue integrity related to pressure, friction, moisture, and shear Goals: Patient/caregiver will verbalize risk factors for pressure ulcer development Date Initiated: 03/26/2021 Target Resolution Date: 09/07/2022 Goal Status: Active Patient/caregiver will verbalize understanding of pressure ulcer management Date Initiated: 03/26/2021 Date Inactivated: 02/04/2022 Target Resolution Date: 02/04/2022 Goal Status: Met Interventions: Assess: immobility, friction, shearing, incontinence upon admission and as needed Assess offloading mechanisms upon admission and as needed Assess potential for pressure ulcer upon admission and as needed Provide education on pressure ulcers Notes: 09/25/21: Uses air mattress, pressure relief ongoing. Wound/Skin Impairment Nursing Diagnoses: Impaired tissue integrity Knowledge deficit related to ulceration/compromised skin integrity Goals: Patient/caregiver will verbalize understanding of skin care regimen Date Initiated: 03/26/2021 Target Resolution  Date: 09/07/2022 Goal Status: Active Ulcer/skin breakdown will have a volume reduction of 30% by week 4 Date Initiated: 03/26/2021 Date Inactivated: 07/24/2021 Target Resolution Date: 04/27/2021 Goal Status: Met Interventions: Assess patient/caregiver ability to obtain necessary supplies Assess patient/caregiver ability to perform ulcer/skin care regimen upon admission and as needed Assess ulceration(s) every visit Provide education on ulcer and skin care Notes: 07/24/21: Pressure relief ongoing. Patient has low loss air mattress. 09/25/21: Wound care regimen continues, has HH assisting with dressing changes. Electronic Signature(s) Signed: 08/20/2022 7:49:05 AM By: Brenton Grills Entered By: Brenton Grills on 07/22/2022 10:45:32 -------------------------------------------------------------------------------- Pain Assessment Details Patient Name: Date of Service: [name], [name]. 07/22/2022 10:15 A M Medical Record Number: [id number] Patient Account Number: [id number] Date of Birth/Sex: Treating RN: [birth year] (52 y.o. Female) Primary Care Eman Morimoto: Marletta Lor Other Clinician: Referring Marie Chow: Treating Tekelia Kareem/Extender: Stasia Cavalier in Treatment: 36 Active Problems Location of Pain Severity and Description of Pain [name], [name] ([id number]) [phone].pdf Page 6 of 8 Patient Has Paino No  Site Locations Pain Management and Medication Current Pain Management: Electronic Signature(s) Signed: 07/22/2022 1:25:47 PM By: Dayton Scrape Entered By: Dayton Scrape on 07/22/2022 10:34:29 -------------------------------------------------------------------------------- Patient/Caregiver Education Details Patient Name: Date of Service: [name], [name] 5/13/2024andnbsp10:15 A M Medical Record Number: [id number] Patient Account Number: [id number] Date of Birth/Gender: Treating RN: Mar 21, 1970 (52 y.o. Female) Brenton Grills Primary Care Physician:  Marletta Lor Other Clinician: Referring Physician: Treating Physician/Extender: Stasia Cavalier in Treatment: 30 Education Assessment Education Provided To: Patient Education Topics Provided Wound/Skin Impairment: Methods: Explain/Verbal Responses: State content correctly Electronic Signature(s) Signed: 08/20/2022 7:49:05 AM By: Brenton Grills Entered By: Brenton Grills on 07/22/2022 10:45:52 -------------------------------------------------------------------------------- Wound Assessment Details Patient Name: Date of Service: [name], [name]. 07/22/2022 10:15 A M Medical Record Number: [id number] Patient Account Number: [id number] Date of Birth/Sex: Treating RN: Sep 23, 1970 (52 y.o. Female) Brenton Grills Primary Care Tensley Wery: Marletta Lor Other Clinician: Referring Chao Blazejewski: Treating Myles Tavella/Extender: Stasia Cavalier in Treatment: 16 Wound Status Wound Number: 1 Primary Etiology: Pressure Ulcer Wound Location: Right Gluteus Wound Status: Open Wounding Event: Pressure Injury Comorbid History: Hypertension, Osteomyelitis, Paraplegia Date Acquired: 12/09/2020 [name], [name] ([id number]) [phone].pdf Page 7 of 8 Weeks Of Treatment: 69 Clustered Wound: No Photos Wound Measurements Length: (cm) 4 Width: (cm) 4 Depth: (cm) 4 Area: (cm) 12.566 Volume: (cm) 50.265 % Reduction in Area: 28.9% % Reduction in Volume: 40.7% Epithelialization: None Tunneling: No Undermining: No Wound Description Classification: Category/Stage IV Wound Margin: Well defined, not attached Exudate Amount: Medium Exudate Type: Serosanguineous Exudate Color: red, brown Foul Odor After Cleansing: No Slough/Fibrino No Wound Bed Granulation Amount: Large (67-100%) Exposed Structure Granulation Quality: Red, Pink, Friable Fascia Exposed: No Necrotic Amount: None Present (0%) Fat Layer (Subcutaneous Tissue) Exposed: Yes Tendon  Exposed: No Muscle Exposed: No Joint Exposed: No Bone Exposed: No Periwound Skin Texture Texture Color No Abnormalities Noted: No No Abnormalities Noted: No Callus: No Atrophie Blanche: No Crepitus: No Cyanosis: No Excoriation: No Ecchymosis: No Induration: No Erythema: No Rash: No Hemosiderin Staining: No Scarring: No Mottled: No Pallor: No Moisture Rubor: No No Abnormalities Noted: No Dry / Scaly: No Maceration: No Treatment Notes Wound #1 (Gluteus) Wound Laterality: Right Cleanser Vashe 5.8 (oz) Discharge Instruction: Cleanse the wound with Vashe prior to applying a clean dressing using gauze sponges, not tissue or cotton balls. Peri-Wound Care Topical Primary Dressing Sorbalgon Dressing 4x4 (in/in) Discharge Instruction: Apply to wound bed as instructed Secondary Dressing Woven Gauze Sponge, Non-Sterile 4x4 in Discharge Instruction: Apply over primary dressing as directed. Zetuvit Plus Silicone Border Sacrum Dressing, Sm, 7x7 (in/in) Battaglia, [name] ([id number]) [phone].pdf Page 8 of 8 Discharge Instruction: Apply silicone border over primary dressing as directed. Secured With Compression Wrap Compression Stockings Add-Ons Electronic Signature(s) Signed: 08/20/2022 7:49:05 AM By: Brenton Grills Entered By: Brenton Grills on 07/22/2022 10:40:06 -------------------------------------------------------------------------------- Vitals Details Patient Name: Date of Service: [name], [name]. 07/22/2022 10:15 A M Medical Record Number: [id number] Patient Account Number: [id number] Date of Birth/Sex: Treating RN: [birth year] (52 y.o. Female) Primary Care Nyaja [place]: Marletta Lor Other Clinician: Referring Aune Adami: Treating Zylan Almquist/Extender: Stasia Cavalier in Treatment: 89 Vital Signs Time Taken: 10:33 Temperature (F): 98.0 Height (in): 62 Pulse (bpm): 71 Weight (lbs): 145 Respiratory Rate (breaths/min): 18 Body  Mass Index (BMI): 26.5 Blood Pressure (mmHg): 114/69 Reference Range: 80 - 120 mg / dl Electronic Signature(s) Signed: 08/20/2022 7:49:05 AM By: Brenton Grills Entered By: Brenton Grills on 07/22/2022 10:43:34

## 2022-10-21 ENCOUNTER — Encounter (HOSPITAL_BASED_OUTPATIENT_CLINIC_OR_DEPARTMENT_OTHER): Payer: Medicare PPO | Attending: Internal Medicine | Admitting: Internal Medicine

## 2022-10-21 DIAGNOSIS — L89323 Pressure ulcer of left buttock, stage 3: Secondary | ICD-10-CM | POA: Insufficient documentation

## 2022-10-21 DIAGNOSIS — Z87891 Personal history of nicotine dependence: Secondary | ICD-10-CM | POA: Insufficient documentation

## 2022-10-21 DIAGNOSIS — I10 Essential (primary) hypertension: Secondary | ICD-10-CM | POA: Insufficient documentation

## 2022-10-21 DIAGNOSIS — G35 Multiple sclerosis: Secondary | ICD-10-CM | POA: Insufficient documentation

## 2022-10-21 DIAGNOSIS — L89324 Pressure ulcer of left buttock, stage 4: Secondary | ICD-10-CM | POA: Diagnosis not present

## 2022-10-21 DIAGNOSIS — L89314 Pressure ulcer of right buttock, stage 4: Secondary | ICD-10-CM | POA: Insufficient documentation

## 2022-10-21 DIAGNOSIS — M4628 Osteomyelitis of vertebra, sacral and sacrococcygeal region: Secondary | ICD-10-CM | POA: Insufficient documentation

## 2022-10-21 NOTE — Progress Notes (Signed)
DEYJA, SAVINO ([id number]) [id number] Page 1 of 8 Visit Report for 10/21/2022 Chief Complaint Document Details Patient Name: Date of Service: [name], [name]. 10/21/2022 10:15 A M Medical Record Number: [id number] Patient Account Number: [id number] Date of Birth/Sex: Treating RN: 1970/10/11 (52 y.o. F) Primary Care Provider: Marletta Lor Other Clinician: Referring Provider: Treating Provider/Extender: Stasia Cavalier in Treatment: 52 Information Obtained from: Patient Chief Complaint 03/26/21; Right gluteus wound, 10/21/22; left buttocks wound Electronic Signature(s) Signed: 10/21/2022 12:36:03 PM By: Geralyn Corwin DO Entered By: Geralyn Corwin on 10/21/2022 10:45:08 -------------------------------------------------------------------------------- HPI Details Patient Name: Date of Service: [name], [name]. 10/21/2022 10:15 A M Medical Record Number: [id number] Patient Account Number: [id number] Date of Birth/Sex: Treating RN: 1971-01-19 (52 y.o. F) Primary Care Provider: Marletta Lor Other Clinician: Referring Provider: Treating Provider/Extender: Stasia Cavalier in Treatment: 72 History of Present Illness HPI Description: Admission 03/26/2021 [name] is a 52 year old female with a past medical history of multiple sclerosis for the past 16 years, complicated by severe debility and bedbound since 2015 that presents to the clinic for a 81-month history of ulcer to the right buttocks. She has been admitted to the hospital several times for this issue. She was last hospitalized on 02/18/2021 for septic shock due to the infected ulcer. She was evaluated by infectious disease at that time. She completed a 2- week course of antibiotics. There is no plan for follow-up. She is currently using calcium alginate dressings. She has home health that comes out twice weekly. She currently denies signs of infection. She has  an air mattress for offloading. Of note She has a history of osteomyelitis to the sacrum and treated in 2020 with IV antibiotics for 8 weeks. 3/21; patient presents for follow-up. She has been using calcium alginate to the wound bed and using Vashe to help clean prior to dressing changes. She has no issues or complaints today. 5/16; patient presents for follow-up. She has been using calcium alginate to the wound bed. She has no issues or complaints today. She is not able to aggressively offload the area due to her multiple sclerosis. 7/18; patient presents for follow-up. She had a televisit with Dr. Luciana Axe on 08/03/2021 for possible osteomyelitis noted on x-ray. She was started on doxycycline and Levaquin and completed her 4-week course. She has been using calcium alginate to the wound bed. She has no issues or complaints today. 11/27; patient missed her last clinic appointment. She was last seen 4 months ago. She has a chronic wound to her right buttocks that has been treated for osteomyelitis. She has been using calcium alginate to the wound bed. She uses a wound cleanser in between dressing changes. She tries to aggressively offload the area. She states that due to cost she cannot come in frequently. 2/19; patient presents for follow-up. She follows in our clinic every 2 to 3 months for her chronic osteomyelitis sacral wound. She has been using calcium alginate and cleaning the area with Vashe. She has no issues or complaints today. She states she would like to follow in our clinic every 3 months and cannot do so sooner due to cost of transportation. 5/13; patient presents for follow-up. She follows in our clinic every 3 months for chronic osteomyelitis to the sacrum. She states she was just recently released by home health and would like to try and reestablish care with a new agency. She has been using calcium alginate to the wound bed. She  has been cleaning it with Vashe. 10/21/2022; patient  presents for follow-up. She usually follows in our clinic every 2 to 3 months for her chronic osteomyelitis to the sacrum/right buttocks. Due to transportation she cannot come sooner. Unfortunately she has developed a new wound to her left ischium about 3 weeks ago. She has been using alginate [name], [name] ([id number]) [phone].pdf Page 2 of 8 dressings to this. She does not offload the wound beds. Electronic Signature(s) Signed: 10/21/2022 12:36:03 PM By: Geralyn Corwin DO Entered By: Geralyn Corwin on 10/21/2022 10:46:17 -------------------------------------------------------------------------------- Physical Exam Details Patient Name: Date of Service: [name], [name]. 10/21/2022 10:15 A M Medical Record Number: [id number] Patient Account Number: [id number] Date of Birth/Sex: Treating RN: 05-07-1970 (52 y.o. F) Primary Care Provider: Marletta Lor Other Clinician: Referring Provider: Treating Provider/Extender: Stasia Cavalier in Treatment: 6 Constitutional respirations regular, non-labored and within target range for patient.Marland Kitchen Psychiatric pleasant and cooperative. Notes Right buttocks: Open wound with granulation tissue and undermining. No bone exposed. Surrounding skin intact. No rashes. No signs of surrounding soft tissue infection. Left buttocks: T the ischium there is an open wound that probes close to bone. Mostly granulation tissue throughout the wound bed. o Electronic Signature(s) Signed: 10/21/2022 12:36:03 PM By: Geralyn Corwin DO Entered By: Geralyn Corwin on 10/21/2022 10:47:17 -------------------------------------------------------------------------------- Physician Orders Details Patient Name: Date of Service: [name], [name]. 10/21/2022 10:15 A M Medical Record Number: [id number] Patient Account Number: [id number] Date of Birth/Sex: Treating RN: 10/28/1970 (52 y.o. [name] Primary Care Provider: Marletta Lor Other Clinician: Referring Provider: Treating Provider/Extender: Stasia Cavalier in Treatment: 60 Verbal / Phone Orders: No Diagnosis Coding ICD-10 Coding Code Description L89.314 Pressure ulcer of right buttock, stage 4 G35 Multiple sclerosis M46.28 Osteomyelitis of vertebra, sacral and sacrococcygeal region L89.323 Pressure ulcer of left buttock, stage 3 Follow-up Appointments ppointment in: - 3 months follow up with Dr. Mikey Bussing ******STRETCHER ROOM***** Return A Other: - Pick up oral antibiotics for left hip wound. Anesthetic (In clinic) Topical Lidocaine 4% applied to wound bed Off-Loading CAYLAH, OLMO [name] ([id number]) [phone].pdf Page 3 of 8 Low air-loss mattress (Group 2) Turn and reposition every 2 hours Home Health New wound care orders this week; continue Home Health for wound care. May utilize formulary equivalent dressing for wound treatment orders unless otherwise specified. - vashe wet to dry daily dressing changes. Dressing changes to be completed by Home Health on Monday / Wednesday / Friday except when patient has scheduled visit at [place]. Other Home Health Orders/Instructions: Wound Treatment Wound #1 - Gluteus Wound Laterality: Right Cleanser: Vashe 5.8 (oz) (Home Health) 1 x Per Day/Other:3 months Discharge Instructions: Cleanse the wound with Vashe prior to applying a clean dressing using gauze sponges, not tissue or cotton balls. Prim Dressing: Vashe wet to dry 1 x Per Day/Other:3 months ary Discharge Instructions: pack into undermining. Secondary Dressing: Woven Gauze Sponge, Non-Sterile 4x4 in (Home Health) 1 x Per Day/Other:3 months Discharge Instructions: Apply over primary dressing as directed. Secondary Dressing: Zetuvit Plus Silicone Border Sacrum Dressing, Sm, 7x7 (in/in) (Home Health) 1 x Per Day/Other:3 months Discharge Instructions: Apply silicone border over primary dressing as  directed. Wound #2 - Ischium Wound Laterality: Left Cleanser: Vashe 5.8 (oz) (Home Health) 1 x Per Day/Other:3 months Discharge Instructions: Cleanse the wound with Vashe prior to applying a clean dressing using gauze sponges, not tissue or cotton balls. Prim Dressing: Vashe wet to dry 1 x Per Day/Other:3  months ary Discharge Instructions: pack into undermining and tunneling. Secondary Dressing: Woven Gauze Sponge, Non-Sterile 4x4 in (Home Health) 1 x Per Day/Other:3 months Discharge Instructions: Apply over primary dressing as directed. Secondary Dressing: Zetuvit Plus Silicone Border Sacrum Dressing, Sm, 7x7 (in/in) (Home Health) 1 x Per Day/Other:3 months Discharge Instructions: Apply silicone border over primary dressing as directed. Patient Medications llergies: silver, tizanidine, tomato A Notifications Medication Indication Start End 10/21/2022 amoxicillin-pot clavulanate DOSE 1 - oral 875 mg-125 mg tablet - 1 tablet oral twice a day x 14 days 10/21/2022 doxycycline hyclate DOSE 1 - oral 100 mg tablet - 1 tablet oral twice a day x 14 days Electronic Signature(s) Signed: 10/21/2022 10:54:46 AM By: Geralyn Corwin DO Entered By: Geralyn Corwin on 10/21/2022 10:54:46 -------------------------------------------------------------------------------- Problem List Details Patient Name: Date of Service: [name], Cadance [name]. 10/21/2022 10:15 A M Medical Record Number: [id number] Patient Account Number: [id number] Date of Birth/Sex: Treating RN: 08-17-1970 (52 y.o. [name], [name] Primary Care Provider: Marletta Lor Other Clinician: Referring Provider: Treating Provider/Extender: Stasia Cavalier in Treatment: 43 Active Problems ICD-10 Encounter Code Description Active Date MDM Diagnosis JISSEL, GATHINGS ([id number]) [id number] Page 4 of 8 L89.314 Pressure ulcer of right buttock, stage 4 03/26/2021 No Yes L89.324 Pressure ulcer of left  buttock, stage 4 10/21/2022 No Yes G35 Multiple sclerosis 03/26/2021 No Yes M46.28 Osteomyelitis of vertebra, sacral and sacrococcygeal region 03/26/2021 No Yes Inactive Problems Resolved Problems Electronic Signature(s) Signed: 10/21/2022 12:36:03 PM By: Geralyn Corwin DO Entered By: Geralyn Corwin on 10/21/2022 10:44:39 -------------------------------------------------------------------------------- Progress Note Details Patient Name: Date of Service: [name], [name]. 10/21/2022 10:15 A M Medical Record Number: [id number] Patient Account Number: [id number] Date of Birth/Sex: Treating RN: 1971-01-09 (52 y.o. F) Primary Care Provider: Marletta Lor Other Clinician: Referring Provider: Treating Provider/Extender: Stasia Cavalier in Treatment: 50 Subjective Chief Complaint Information obtained from Patient 03/26/21; Right gluteus wound, 10/21/22; left buttocks wound History of Present Illness (HPI) Admission 03/26/2021 [name] is a 52 year old female with a past medical history of multiple sclerosis for the past 16 years, complicated by severe debility and bedbound since 2015 that presents to the clinic for a 22-month history of ulcer to the right buttocks. She has been admitted to the hospital several times for this issue. She was last hospitalized on 02/18/2021 for septic shock due to the infected ulcer. She was evaluated by infectious disease at that time. She completed a 2- week course of antibiotics. There is no plan for follow-up. She is currently using calcium alginate dressings. She has home health that comes out twice weekly. She currently denies signs of infection. She has an air mattress for offloading. Of note She has a history of osteomyelitis to the sacrum and treated in 2020 with IV antibiotics for 8 weeks. 3/21; patient presents for follow-up. She has been using calcium alginate to the wound bed and using Vashe to help clean prior to dressing  changes. She has no issues or complaints today. 5/16; patient presents for follow-up. She has been using calcium alginate to the wound bed. She has no issues or complaints today. She is not able to aggressively offload the area due to her multiple sclerosis. 7/18; patient presents for follow-up. She had a televisit with Dr. Luciana Axe on 08/03/2021 for possible osteomyelitis noted on x-ray. She was started on doxycycline and Levaquin and completed her 4-week course. She has been using calcium alginate to the wound bed. She has no issues or  complaints today. 11/27; patient missed her last clinic appointment. She was last seen 4 months ago. She has a chronic wound to her right buttocks that has been treated for osteomyelitis. She has been using calcium alginate to the wound bed. She uses a wound cleanser in between dressing changes. She tries to aggressively offload the area. She states that due to cost she cannot come in frequently. 2/19; patient presents for follow-up. She follows in our clinic every 2 to 3 months for her chronic osteomyelitis sacral wound. She has been using calcium alginate and cleaning the area with Vashe. She has no issues or complaints today. She states she would like to follow in our clinic every 3 months and cannot do so sooner due to cost of transportation. 5/13; patient presents for follow-up. She follows in our clinic every 3 months for chronic osteomyelitis to the sacrum. She states she was just recently released by home health and would like to try and reestablish care with a new agency. She has been using calcium alginate to the wound bed. She has been cleaning it with Vashe. 10/21/2022; patient presents for follow-up. She usually follows in our clinic every 2 to 3 months for her chronic osteomyelitis to the sacrum/right buttocks. Due to transportation she cannot come sooner. Unfortunately she has developed a new wound to her left ischium about 3 weeks ago. She has been using  alginate [name], [name] ([id number]) [phone].pdf Page 5 of 8 dressings to this. She does not offload the wound beds. Patient History Information obtained from Patient. Family History Unknown History. Social History Former smoker, Marital Status - Single, Alcohol Use - Never, Drug Use - No History, Caffeine Use - Rarely. Medical History Cardiovascular Patient has history of Hypertension Denies history of Deep Vein Thrombosis Integumentary (Skin) Denies history of History of Burn Musculoskeletal Patient has history of Osteomyelitis - right ischial tuberosity Neurologic Patient has history of Paraplegia Medical A Surgical History Notes nd Neurologic Multiple Sclerosis Objective Constitutional respirations regular, non-labored and within target range for patient.. Vitals Time Taken: 10:18 AM, Height: 62 in, Weight: 145 lbs, BMI: 26.5, Temperature: 97.8 F, Pulse: 65 bpm, Respiratory Rate: 18 breaths/min, Blood Pressure: 99/67 mmHg. Psychiatric pleasant and cooperative. General Notes: Right buttocks: Open wound with granulation tissue and undermining. No bone exposed. Surrounding skin intact. No rashes. No signs of surrounding soft tissue infection. Left buttocks: T the ischium there is an open wound that probes close to bone. Mostly granulation tissue throughout the o wound bed. Integumentary (Hair, Skin) Wound #1 status is Open. Original cause of wound was Pressure Injury. The date acquired was: 12/09/2020. The wound has been in treatment 82 weeks. The wound is located on the Right Gluteus. The wound measures 3cm length x 5.7cm width x 3.6cm depth; 13.43cm^2 area and 48.349cm^3 volume. There is muscle and Fat Layer (Subcutaneous Tissue) exposed. There is no tunneling noted, however, there is undermining starting at 9:00 and ending at 2:00 with a maximum distance of 3.2cm. There is a medium amount of serosanguineous drainage noted. The wound margin is well  defined and not attached to the wound base. There is large (67-100%) red, pink, friable granulation within the wound bed. There is no necrotic tissue within the wound bed. The periwound skin appearance did not exhibit: Callus, Crepitus, Excoriation, Induration, Rash, Scarring, Dry/Scaly, Maceration, Atrophie Blanche, Cyanosis, Ecchymosis, Hemosiderin Staining, Mottled, Pallor, Rubor, Erythema. Wound #2 status is Open. Original cause of wound was Pressure Injury. The date acquired was: 09/30/2022. The wound  is located on the Left Ischium. The wound measures 2.5cm length x 2.4cm width x 0.4cm depth; 4.712cm^2 area and 1.885cm^3 volume. There is muscle and Fat Layer (Subcutaneous Tissue) exposed. Tunneling has been noted at 9:00 with a maximum distance of 5.3cm. Undermining begins at 6:00 and ends at 8:00 with a maximum distance of 3cm. There is a large amount of serosanguineous drainage noted. The wound margin is distinct with the outline attached to the wound base. There is large (67-100%) pink, friable, hyper - granulation within the wound bed. There is a small (1-33%) amount of necrotic tissue within the wound bed including Adherent Slough. The periwound skin appearance did not exhibit: Callus, Crepitus, Excoriation, Induration, Rash, Scarring, Dry/Scaly, Maceration, Atrophie Blanche, Cyanosis, Ecchymosis, Hemosiderin Staining, Mottled, Pallor, Rubor, Erythema. Assessment Active Problems ICD-10 Pressure ulcer of right buttock, stage 4 Pressure ulcer of left buttock, stage 4 Multiple sclerosis Osteomyelitis of vertebra, sacral and sacrococcygeal region Patient's right buttocks wound is stable. Unfortunately she has developed a new wound to the left buttocks that probes very close to bone. I will go ahead and start her on oral antibiotics. We had a long discussion about the goals of care for her today. Unfortunately she states she cannot come in but more than [name], [name] ([id number])  [id number] Page 6 of 8 once every 2 to 3 months. She states that transportation is a major issue. I recommended she try a another mode of transportation other than EMS to help with cost. She states she will look into this. Due to Her inability to come in on a regular basis and her inability to offload I am very limited in my ability to heal these wounds. Also I recommended she let us know when she develops a new wound. She was understanding of this. She is aware that these wounds may never heal without proper follow up and care. She knows to call with any questions or concerns. For now I recommended Vashe wet-to-dry dressings and continue aggressive offloading. Plan Follow-up Appointments: Return Appointment in: - 3 months follow up with Dr. Mikey Bussing ******STRETCHER ROOM***** Other: - Pick up oral antibiotics for left hip wound. Anesthetic: (In clinic) Topical Lidocaine 4% applied to wound bed Off-Loading: Low air-loss mattress (Group 2) Turn and reposition every 2 hours Home Health: New wound care orders this week; continue Home Health for wound care. May utilize formulary equivalent dressing for wound treatment orders unless otherwise specified. - vashe wet to dry daily dressing changes. Dressing changes to be completed by Home Health on Monday / Wednesday / Friday except when patient has scheduled visit at [facility]. Other Home Health Orders/Instructions: The following medication(s) was prescribed: amoxicillin-pot clavulanate oral 875 mg-125 mg tablet 1 1 tablet oral twice a day x 14 days starting 10/21/2022 doxycycline hyclate oral 100 mg tablet 1 1 tablet oral twice a day x 14 days starting 10/21/2022 WOUND #1: - Gluteus Wound Laterality: Right Cleanser: Vashe 5.8 (oz) (Home Health) 1 x Per Day/Other:3 months Discharge Instructions: Cleanse the wound with Vashe prior to applying a clean dressing using gauze sponges, not tissue or cotton balls. Prim  Dressing: Vashe wet to dry 1 x Per Day/Other:3 months ary Discharge Instructions: pack into undermining. Secondary Dressing: Woven Gauze Sponge, Non-Sterile 4x4 in (Home Health) 1 x Per Day/Other:3 months Discharge Instructions: Apply over primary dressing as directed. Secondary Dressing: Zetuvit Plus Silicone Border Sacrum Dressing, Sm, 7x7 (in/in) (Home Health) 1 x Per Day/Other:3 months Discharge Instructions: Apply silicone border over primary  dressing as directed. WOUND #2: - Ischium Wound Laterality: Left Cleanser: Vashe 5.8 (oz) (Home Health) 1 x Per Day/Other:3 months Discharge Instructions: Cleanse the wound with Vashe prior to applying a clean dressing using gauze sponges, not tissue or cotton balls. Prim Dressing: Vashe wet to dry 1 x Per Day/Other:3 months ary Discharge Instructions: pack into undermining and tunneling. Secondary Dressing: Woven Gauze Sponge, Non-Sterile 4x4 in (Home Health) 1 x Per Day/Other:3 months Discharge Instructions: Apply over primary dressing as directed. Secondary Dressing: Zetuvit Plus Silicone Border Sacrum Dressing, Sm, 7x7 (in/in) (Home Health) 1 x Per Day/Other:3 months Discharge Instructions: Apply silicone border over primary dressing as directed. 1. Augmentin and doxycycline 2. Aggressive offloading - Reposition every 1-2 hours, air mattress 3. Vashe wet-to-dry dressings 4. Follow-up in 2 months Electronic Signature(s) Signed: 10/21/2022 12:36:03 PM By: Geralyn Corwin DO Entered By: Geralyn Corwin on 10/21/2022 10:55:57 -------------------------------------------------------------------------------- HxROS Details Patient Name: Date of Service: [name], [name]. 10/21/2022 10:15 A M Medical Record Number: [id number] Patient Account Number: [id number] Date of Birth/Sex: Treating RN: Feb 27, 1971 (52 y.o. F) Primary Care Provider: Marletta Lor Other Clinician: Referring Provider: Treating Provider/Extender: Stasia Cavalier in Treatment: 76 Information Obtained From Patient Cardiovascular Medical History: Positive for: Hypertension Negative for: Deep Vein Thrombosis Julia, [name] ([id number]) [id number] Page 7 of 8 Integumentary (Skin) Medical History: Negative for: History of Burn Musculoskeletal Medical History: Positive for: Osteomyelitis - right ischial tuberosity Neurologic Medical History: Positive for: Paraplegia Past Medical History Notes: Multiple Sclerosis Immunizations Pneumococcal Vaccine: Received Pneumococcal Vaccination: No Implantable Devices Yes Family and Social History Unknown History: Yes; Former smoker; Marital Status - Single; Alcohol Use: Never; Drug Use: No History; Caffeine Use: Rarely; Financial Concerns: No; Food, Clothing or Shelter Needs: No; Support System Lacking: No; Transportation Concerns: No Electronic Signature(s) Signed: 10/21/2022 12:36:03 PM By: Geralyn Corwin DO Entered By: Geralyn Corwin on 10/21/2022 10:46:30 -------------------------------------------------------------------------------- SuperBill Details Patient Name: Date of Service: [name], Neytiri [name]. 10/21/2022 Medical Record Number: [id number] Patient Account Number: [id number] Date of Birth/Sex: Treating RN: 1971-03-08 (52 y.o. [name] Primary Care Provider: Marletta Lor Other Clinician: Referring Provider: Treating Provider/Extender: Stasia Cavalier in Treatment: 43 Diagnosis Coding ICD-10 Codes Code Description L89.314 Pressure ulcer of right buttock, stage 4 L89.324 Pressure ulcer of left buttock, stage 4 G35 Multiple sclerosis M46.28 Osteomyelitis of vertebra, sacral and sacrococcygeal region Facility Procedures : CPT4 Code: [id number] Description: 60630 - WOUND CARE VISIT-LEV 5 EST PT Modifier: Quantity: 1 Physician Procedures : CPT4 Code Description Modifier [id number] - WC PHYS LEVEL 4 - EST PT ICD-10  Diagnosis Description L89.314 Pressure ulcer of right buttock, stage 4 L89.324 Pressure ulcer of left buttock, stage 4 G35 Multiple sclerosis M46.28 Osteomyelitis of  vertebra, sacral and sacrococcygeal region [name], [name] ([id number]) [id number] Pa Quantity: 1 ge 8 of 8 Electronic Signature(s) Signed: 10/21/2022 12:36:03 PM By: Geralyn Corwin DO Entered By: Geralyn Corwin on 10/21/2022 10:56:01

## 2022-10-21 NOTE — Progress Notes (Signed)
[name] ([id number]) [id number] Page 1 of 11 Visit Report for 10/21/2022 Arrival Information Details Patient Name: Date of Service: STA [name] [name], [name] 10/21/2022 10:15 A M Medical Record Number: [id number] Patient Account Number: [id number] Date of Birth/Sex: Treating RN: 1970/11/27 (52 y.o. F) Primary Care : Marletta Lor Other Clinician: Referring : Treating /Extender: Stasia Cavalier in Treatment: 74 Visit Information History Since Last Visit All ordered tests and consults were completed: No Patient Arrived: Stretcher Added or deleted any medications: No Arrival Time: 10:17 Any new allergies or adverse reactions: No Accompanied By: self Had a fall or experienced change in No Transfer Assistance: Stretcher activities of daily living that may affect Patient Identification Verified: Yes risk of falls: Secondary Verification Process Completed: Yes Signs or symptoms of abuse/neglect since last visito No Patient Requires Transmission-Based Precautions: No Hospitalized since last visit: No Patient Has Alerts: No Implantable device outside of the clinic excluding No cellular tissue based products placed in the center since last visit: Pain Present Now: No Electronic Signature(s) Signed: 10/21/2022 11:47:11 AM By: Dayton Scrape Entered By: Dayton Scrape on 10/21/2022 10:17:57 -------------------------------------------------------------------------------- Clinic Level of Care Assessment Details Patient Name: Date of Service: STA [name] [name], [name] 10/21/2022 10:15 A M Medical Record Number: [id number] Patient Account Number: [id number] Date of Birth/Sex: Treating RN: June 03, 1970 (52 y.o. [name] Primary Care : Marletta Lor Other Clinician: Referring : Treating /Extender: Stasia Cavalier in Treatment: 52 Clinic Level of Care Assessment Items TOOL 4 Quantity  Score X- 1 0 Use when only an EandM is performed on FOLLOW-UP visit ASSESSMENTS - Nursing Assessment / Reassessment X- 1 10 Reassessment of Co-morbidities (includes updates in patient status) X- 1 5 Reassessment of Adherence to Treatment Plan ASSESSMENTS - Wound and Skin A ssessment / Reassessment []  - 0 Simple Wound Assessment / Reassessment - one wound X- 4 5 Complex Wound Assessment / Reassessment - multiple wounds []  - 0 Dermatologic / Skin Assessment (not related to wound area) ASSESSMENTS - Focused Assessment []  - 0 Circumferential Edema Measurements - multi extremities X- 1 10 Nutritional Assessment / Counseling / Intervention Julia, [name] ([id number]) [id number] Page 2 of 11 []  - 0 Lower Extremity Assessment (monofilament, tuning fork, pulses) []  - 0 Peripheral Arterial Disease Assessment (using hand held doppler) ASSESSMENTS - Ostomy and/or Continence Assessment and Care []  - 0 Incontinence Assessment and Management []  - 0 Ostomy Care Assessment and Management (repouching, etc.) PROCESS - Coordination of Care []  - 0 Simple Patient / Family Education for ongoing care X- 1 20 Complex (extensive) Patient / Family Education for ongoing care X- 1 10 Staff obtains Chiropractor, Records, T Results / Process Orders est X- 1 10 Staff telephones HHA, Nursing Homes / Clarify orders / etc []  - 0 Routine Transfer to another Facility (non-emergent condition) []  - 0 Routine Hospital Admission (non-emergent condition) []  - 0 New Admissions / Manufacturing engineer / Ordering NPWT Apligraf, etc. , []  - 0 Emergency Hospital Admission (emergent condition) []  - 0 Simple Discharge Coordination X- 1 15 Complex (extensive) Discharge Coordination PROCESS - Special Needs []  - 0 Pediatric / Minor Patient Management []  - 0 Isolation Patient Management []  - 0 Hearing / Language / Visual special needs []  - 0 Assessment of Community assistance  (transportation, D/C planning, etc.) []  - 0 Additional assistance / Altered mentation []  - 0 Support Surface(s) Assessment (bed, cushion, seat, etc.) INTERVENTIONS - Wound Cleansing / Measurement []  - 0 Simple Wound Cleansing -  one wound X- 2 5 Complex Wound Cleansing - multiple wounds X- 1 5 Wound Imaging (photographs - any number of wounds) []  - 0 Wound Tracing (instead of photographs) []  - 0 Simple Wound Measurement - one wound X- 2 5 Complex Wound Measurement - multiple wounds INTERVENTIONS - Wound Dressings []  - 0 Small Wound Dressing one or multiple wounds X- 2 15 Medium Wound Dressing one or multiple wounds []  - 0 Large Wound Dressing one or multiple wounds []  - 0 Application of Medications - topical []  - 0 Application of Medications - injection INTERVENTIONS - Miscellaneous []  - 0 External ear exam []  - 0 Specimen Collection (cultures, biopsies, blood, body fluids, etc.) []  - 0 Specimen(s) / Culture(s) sent or taken to Lab for analysis []  - 0 Patient Transfer (multiple staff / Nurse, adult / Similar devices) []  - 0 Simple Staple / Suture removal (25 or less) []  - 0 Complex Staple / Suture removal (26 or more) []  - 0 Hypo / Hyperglycemic Management (close monitor of Blood Glucose) [name], [name] ([id number]) [id number] Page 3 of 11 []  - 0 Ankle / Brachial Index (ABI) - do not check if billed separately X- 1 5 Vital Signs Has the patient been seen at the hospital within the last three years: Yes Total Score: 160 Level Of Care: New/Established - Level 5 Electronic Signature(s) Signed: 10/21/2022 3:26:57 PM By: Shawn Stall RN, BSN Entered By: Shawn Stall on 10/21/2022 10:41:12 -------------------------------------------------------------------------------- Encounter Discharge Information Details Patient Name: Date of Service: STA [name] [name], [name] 10/21/2022 10:15 A M Medical Record Number: [id number] Patient Account Number:  [id number] Date of Birth/Sex: Treating RN: Dec 02, 1970 (52 y.o. Debara Pickett, Yvonne Kendall Primary Care : Marletta Lor Other Clinician: Referring : Treating /Extender: Stasia Cavalier in Treatment: 55 Encounter Discharge Information Items Discharge Condition: Stable Ambulatory Status: Stretcher Discharge Destination: Home Transportation: Private Auto Accompanied By: self Schedule Follow-up Appointment: Yes Clinical Summary of Care: Electronic Signature(s) Signed: 10/21/2022 3:26:57 PM By: Shawn Stall RN, BSN Entered By: Shawn Stall on 10/21/2022 10:42:17 -------------------------------------------------------------------------------- Lower Extremity Assessment Details Patient Name: Date of Service: STA [name] [name], [name] 10/21/2022 10:15 A M Medical Record Number: [id number] Patient Account Number: [id number] Date of Birth/Sex: Treating RN: 10-03-1970 (52 y.o. Katrinka Blazing Primary Care : Marletta Lor Other Clinician: Referring : Treating /Extender: Stasia Cavalier in Treatment: 87 Electronic Signature(s) Signed: 10/21/2022 2:19:20 PM By: Karie Schwalbe RN Entered By: Karie Schwalbe on 10/21/2022 10:47:51 -------------------------------------------------------------------------------- Multi Wound Chart Details Patient Name: Date of Service: STA [name] [name], [name] 10/21/2022 10:15 A M Medical Record Number: [id number] Patient Account Number: [id number] CAMRYN, RISSE ([id number]) [id number] Page 4 of 11 Date of Birth/Sex: Treating RN: December 28, 1970 (52 y.o. F) Primary Care : Other Clinician: Marletta Lor Referring : Treating /Extender: Stasia Cavalier in Treatment: 83 Vital Signs Height(in): 62 Pulse(bpm): 65 Weight(lbs): 145 Blood Pressure(mmHg): 99/67 Body Mass Index(BMI): 26.5 Temperature(F): 97.8 Respiratory Rate(breaths/min):  18 [1:Photos:] [N/A:N/A] Right Gluteus Left Ischium N/A Wound Location: Pressure Injury Pressure Injury N/A Wounding Event: Pressure Ulcer Pressure Ulcer N/A Primary Etiology: Hypertension, Osteomyelitis, Hypertension, Osteomyelitis, N/A Comorbid History: Paraplegia Paraplegia 12/09/2020 09/30/2022 N/A Date Acquired: 82 0 N/A Weeks of Treatment: Open Open N/A Wound Status: No No N/A Wound Recurrence: 3x5.7x3.6 2.5x2.4x0.4 N/A Measurements L x W x D (cm) 13.43 4.712 N/A A (cm) : rea 48.349 1.885 N/A Volume (cm) : 24.00% N/A N/A % Reduction in A rea: 43.00% N/A N/A % Reduction  in Volume: 9 Position 1 (o'clock): 5.3 Maximum Distance 1 (cm): 9 6 Starting Position 1 (o'clock): 2 8 Ending Position 1 (o'clock): 3.2 3 Maximum Distance 1 (cm): No Yes N/A Tunneling: Yes Yes N/A Undermining: Category/Stage IV Category/Stage IV N/A Classification: Medium Large N/A Exudate A mount: Serosanguineous Serosanguineous N/A Exudate Type: red, brown red, brown N/A Exudate Color: Well defined, not attached Distinct, outline attached N/A Wound Margin: Large (67-100%) Large (67-100%) N/A Granulation A mount: Red, Pink, Friable Pink, Hyper-granulation, Friable N/A Granulation Quality: None Present (0%) Small (1-33%) N/A Necrotic A mount: Fat Layer (Subcutaneous Tissue): Yes Fat Layer (Subcutaneous Tissue): Yes N/A Exposed Structures: Muscle: Yes Muscle: Yes Fascia: No Fascia: No Tendon: No Tendon: No Joint: No Joint: No Bone: No Bone: No None N/A N/A Epithelialization: Excoriation: No Excoriation: No N/A Periwound Skin Texture: Induration: No Induration: No Callus: No Callus: No Crepitus: No Crepitus: No Rash: No Rash: No Scarring: No Scarring: No Maceration: No Maceration: No N/A Periwound Skin Moisture: Dry/Scaly: No Dry/Scaly: No Atrophie Blanche: No Atrophie Blanche: No N/A Periwound Skin Color: Cyanosis: No Cyanosis: No Ecchymosis:  No Ecchymosis: No Erythema: No Erythema: No Hemosiderin Staining: No Hemosiderin Staining: No Mottled: No Mottled: No Pallor: No Pallor: No Rubor: No Rubor: No Treatment Notes Wound #1 (Gluteus) Wound Laterality: Right Cleanser Malkowski, Akeyla L ([id number]) [id number] Page 5 of 11 Vashe 5.8 (oz) Discharge Instruction: Cleanse the wound with Vashe prior to applying a clean dressing using gauze sponges, not tissue or cotton balls. Peri-Wound Care Topical Primary Dressing Vashe wet to dry Discharge Instruction: pack into undermining. Secondary Dressing Woven Gauze Sponge, Non-Sterile 4x4 in Discharge Instruction: Apply over primary dressing as directed. Zetuvit Plus Silicone Border Sacrum Dressing, Sm, 7x7 (in/in) Discharge Instruction: Apply silicone border over primary dressing as directed. Secured With Compression Wrap Compression Stockings Add-Ons Wound #2 (Ischium) Wound Laterality: Left Cleanser Vashe 5.8 (oz) Discharge Instruction: Cleanse the wound with Vashe prior to applying a clean dressing using gauze sponges, not tissue or cotton balls. Peri-Wound Care Topical Primary Dressing Vashe wet to dry Discharge Instruction: pack into undermining and tunneling. Secondary Dressing Woven Gauze Sponge, Non-Sterile 4x4 in Discharge Instruction: Apply over primary dressing as directed. Zetuvit Plus Silicone Border Sacrum Dressing, Sm, 7x7 (in/in) Discharge Instruction: Apply silicone border over primary dressing as directed. Secured With Compression Wrap Compression Stockings Add-Ons Electronic Signature(s) Signed: 10/21/2022 12:36:03 PM By: Geralyn Corwin DO Entered By: Geralyn Corwin on 10/21/2022 10:44:44 -------------------------------------------------------------------------------- Multi-Disciplinary Care Plan Details Patient Name: Date of Service: STA [name] [name], [name] 10/21/2022 10:15 A M Medical Record Number: [id number] Patient  Account Number: [id number] Date of Birth/Sex: Treating RN: 05-03-1970 (52 y.o. Debara Pickett, Yvonne Kendall Primary Care : Marletta Lor Other Clinician: Referring : Treating /Extender: Stasia Cavalier in Treatment: [address], [name] ([id number]) [id number] Page 6 of 11 Multidisciplinary Care Plan reviewed with physician Active Inactive Nutrition Nursing Diagnoses: Potential for alteratiion in Nutrition/Potential for imbalanced nutrition Goals: Patient/caregiver agrees to and verbalizes understanding of need to use nutritional supplements and/or vitamins as prescribed Date Initiated: 03/26/2021 Target Resolution Date: 01/10/2023 Goal Status: Active Interventions: Assess patient nutrition upon admission and as needed per policy Provide education on nutrition Treatment Activities: Education provided on Nutrition : 03/26/2021 Notes: Pressure Nursing Diagnoses: Knowledge deficit related to causes and risk factors for pressure ulcer development Knowledge deficit related to management of pressures ulcers Potential for impaired tissue integrity related to pressure, friction, moisture, and shear Goals: Patient/caregiver will verbalize risk factors for pressure ulcer  development Date Initiated: 03/26/2021 Target Resolution Date: 12/06/2022 Goal Status: Active Patient/caregiver will verbalize understanding of pressure ulcer management Date Initiated: 03/26/2021 Date Inactivated: 02/04/2022 Target Resolution Date: 02/04/2022 Goal Status: Met Interventions: Assess: immobility, friction, shearing, incontinence upon admission and as needed Assess offloading mechanisms upon admission and as needed Assess potential for pressure ulcer upon admission and as needed Provide education on pressure ulcers Notes: 09/25/21: Uses air mattress, pressure relief ongoing. Wound/Skin Impairment Nursing Diagnoses: Impaired tissue integrity Knowledge  deficit related to ulceration/compromised skin integrity Goals: Patient/caregiver will verbalize understanding of skin care regimen Date Initiated: 03/26/2021 Target Resolution Date: 12/26/2022 Goal Status: Active Ulcer/skin breakdown will have a volume reduction of 30% by week 4 Date Initiated: 03/26/2021 Date Inactivated: 07/24/2021 Target Resolution Date: 04/27/2021 Goal Status: Met Interventions: Assess patient/caregiver ability to obtain necessary supplies Assess patient/caregiver ability to perform ulcer/skin care regimen upon admission and as needed Assess ulceration(s) every visit Provide education on ulcer and skin care Notes: 07/24/21: Pressure relief ongoing. Patient has low loss air mattress. 09/25/21: Wound care regimen continues, has HH assisting with dressing changes. Electronic Signature(s) Signed: 10/21/2022 3:26:57 PM By: Shawn Stall RN, BSN Entered By: Shawn Stall on 10/21/2022 10:26:47 [name] ([id number]) [id number] Page 7 of 11 -------------------------------------------------------------------------------- Pain Assessment Details Patient Name: Date of Service: STA [name] [name], [name] 10/21/2022 10:15 A M Medical Record Number: [id number] Patient Account Number: [id number] Date of Birth/Sex: Treating RN: 19-Feb-1971 (52 y.o. F) Primary Care : Marletta Lor Other Clinician: Referring : Treating /Extender: Stasia Cavalier in Treatment: 70 Active Problems Location of Pain Severity and Description of Pain Patient Has Paino No Site Locations Pain Management and Medication Current Pain Management: Electronic Signature(s) Signed: 10/21/2022 11:47:11 AM By: Dayton Scrape Entered By: Dayton Scrape on 10/21/2022 10:18:23 -------------------------------------------------------------------------------- Patient/Caregiver Education Details Patient Name: Date of Service: STA [name] [name], Dimonique Elbert Ewings  8/12/2024andnbsp10:15 A M Medical Record Number: [id number] Patient Account Number: [id number] Date of Birth/Gender: Treating RN: November 11, 1970 (52 y.o. [name] Primary Care Physician: Marletta Lor Other Clinician: Referring Physician: Treating Physician/Extender: Stasia Cavalier in Treatment: 55 Education Assessment Education Provided To: Patient Education Topics Provided Offloading: Handouts: How Offloading Helps Foot Wounds Heal Methods: Explain/Verbal, Printed Responses: Reinforcements needed UBAH, LAIDLEY ([id number]) [phone].pdf Page 8 of 11 Pain: Handouts: A Guide to Pain Control Methods: Explain/Verbal, Printed Responses: Reinforcements needed Electronic Signature(s) Signed: 10/21/2022 3:26:57 PM By: Shawn Stall RN, BSN Entered By: Shawn Stall on 10/21/2022 10:27:15 -------------------------------------------------------------------------------- Wound Assessment Details Patient Name: Date of Service: STA [name] [name], [name] 10/21/2022 10:15 A M Medical Record Number: [id number] Patient Account Number: [id number] Date of Birth/Sex: Treating RN: 1970-07-21 (52 y.o. Debara Pickett, Millard.Loa Primary Care : Marletta Lor Other Clinician: Referring : Treating /Extender: Stasia Cavalier in Treatment: 95 Wound Status Wound Number: 1 Primary Etiology: Pressure Ulcer Wound Location: Right Gluteus Wound Status: Open Wounding Event: Pressure Injury Comorbid History: Hypertension, Osteomyelitis, Paraplegia Date Acquired: 12/09/2020 Weeks Of Treatment: 82 Clustered Wound: No Photos Wound Measurements Length: (cm) 3 Width: (cm) 5.7 Depth: (cm) 3.6 Area: (cm) 13.43 Volume: (cm) 48.349 % Reduction in Area: 24% % Reduction in Volume: 43% Epithelialization: None Tunneling: No Undermining: Yes Starting Position (o'clock): 9 Ending Position (o'clock): 2 Maximum Distance: (cm) 3.2 Wound  Description Classification: Category/Stage IV Wound Margin: Well defined, not attached Exudate Amount: Medium Exudate Type: Serosanguineous Exudate Color: red, brown Foul Odor After Cleansing: No Slough/Fibrino No Wound Bed Granulation Amount: Large (67-100%) Exposed Structure  Granulation Quality: Red, Pink, Friable Fascia Exposed: No Necrotic Amount: None Present (0%) Fat Layer (Subcutaneous Tissue) Exposed: Yes Tendon Exposed: No Muscle Exposed: Yes Pita, [place] L ([id number]) [id number] Page 9 of 11 Necrosis of Muscle: No Joint Exposed: No Bone Exposed: No Periwound Skin Texture Texture Color No Abnormalities Noted: No No Abnormalities Noted: No Callus: No Atrophie Blanche: No Crepitus: No Cyanosis: No Excoriation: No Ecchymosis: No Induration: No Erythema: No Rash: No Hemosiderin Staining: No Scarring: No Mottled: No Pallor: No Moisture Rubor: No No Abnormalities Noted: No Dry / Scaly: No Maceration: No Treatment Notes Wound #1 (Gluteus) Wound Laterality: Right Cleanser Vashe 5.8 (oz) Discharge Instruction: Cleanse the wound with Vashe prior to applying a clean dressing using gauze sponges, not tissue or cotton balls. Peri-Wound Care Topical Primary Dressing Vashe wet to dry Discharge Instruction: pack into undermining. Secondary Dressing Woven Gauze Sponge, Non-Sterile 4x4 in Discharge Instruction: Apply over primary dressing as directed. Zetuvit Plus Silicone Border Sacrum Dressing, Sm, 7x7 (in/in) Discharge Instruction: Apply silicone border over primary dressing as directed. Secured With Compression Wrap Compression Stockings Add-Ons Electronic Signature(s) Signed: 10/21/2022 11:47:11 AM By: Dayton Scrape Signed: 10/21/2022 3:26:57 PM By: Shawn Stall RN, BSN Entered By: Dayton Scrape on 10/21/2022 10:27:17 -------------------------------------------------------------------------------- Wound Assessment Details Patient  Name: Date of Service: STA [name] [name], Zell L. 10/21/2022 10:15 A M Medical Record Number: [id number] Patient Account Number: [id number] Date of Birth/Sex: Treating RN: [birth year] (52 y.o. [name] Primary Care : Marletta Lor Other Clinician: Referring : Treating /Extender: Stasia Cavalier in Treatment: 43 Wound Status Wound Number: 2 Primary Etiology: Pressure Ulcer Wound Location: Left Ischium Wound Status: Open Wounding Event: Pressure Injury Comorbid History: Hypertension, Osteomyelitis, Paraplegia Date Acquired: 09/30/2022 Weeks Of Treatment: 0 ZARAI, MALONY ([id number]) [phone].pdf Page 10 of 11 Clustered Wound: No Photos Wound Measurements Length: (cm) 2.5 Width: (cm) 2.4 Depth: (cm) 0.4 Area: (cm) 4.712 Volume: (cm) 1.885 % Reduction in Area: % Reduction in Volume: Tunneling: Yes Position (o'clock): 9 Maximum Distance: (cm) 5.3 Undermining: Yes Starting Position (o'clock): 6 Ending Position (o'clock): 8 Maximum Distance: (cm) 3 Wound Description Classification: Category/Stage IV Wound Margin: Distinct, outline attached Exudate Amount: Large Exudate Type: Serosanguineous Exudate Color: red, brown Foul Odor After Cleansing: No Slough/Fibrino Yes Wound Bed Granulation Amount: Large (67-100%) Exposed Structure Granulation Quality: Pink, Hyper-granulation, Friable Fascia Exposed: No Necrotic Amount: Small (1-33%) Fat Layer (Subcutaneous Tissue) Exposed: Yes Necrotic Quality: Adherent Slough Tendon Exposed: No Muscle Exposed: Yes Necrosis of Muscle: No Joint Exposed: No Bone Exposed: No Periwound Skin Texture Texture Color No Abnormalities Noted: No No Abnormalities Noted: No Callus: No Atrophie Blanche: No Crepitus: No Cyanosis: No Excoriation: No Ecchymosis: No Induration: No Erythema: No Rash: No Hemosiderin Staining: No Scarring: No Mottled: No Pallor:  No Moisture Rubor: No No Abnormalities Noted: No Dry / Scaly: No Maceration: No Treatment Notes Wound #2 (Ischium) Wound Laterality: Left Cleanser Vashe 5.8 (oz) Discharge Instruction: Cleanse the wound with Vashe prior to applying a clean dressing using gauze sponges, not tissue or cotton balls. Peri-Wound Care Topical Primary Dressing Vashe wet to dry Lukasiewicz, Marajade L ([id number]) [phone].pdf Page 11 of 11 Discharge Instruction: pack into undermining and tunneling. Secondary Dressing Woven Gauze Sponge, Non-Sterile 4x4 in Discharge Instruction: Apply over primary dressing as directed. Zetuvit Plus Silicone Border Sacrum Dressing, Sm, 7x7 (in/in) Discharge Instruction: Apply silicone border over primary dressing as directed. Secured With Compression Wrap Compression Stockings Facilities manager) Signed: 10/21/2022 3:26:57 PM By: Shawn Stall RN,  BSN Entered By: Shawn Stall on 10/21/2022 10:37:08 -------------------------------------------------------------------------------- Vitals Details Patient Name: Date of Service: STA [name] [name], Jonnette L. 10/21/2022 10:15 A M Medical Record Number: [id number] Patient Account Number: [id number] Date of Birth/Sex: Treating RN: 1970/10/13 (52 y.o. F) Primary Care : Marletta Lor Other Clinician: Referring : Treating /Extender: Stasia Cavalier in Treatment: 46 Vital Signs Time Taken: 10:18 Temperature (F): 97.8 Height (in): 62 Pulse (bpm): 65 Weight (lbs): 145 Respiratory Rate (breaths/min): 18 Body Mass Index (BMI): 26.5 Blood Pressure (mmHg): 99/67 Reference Range: 80 - 120 mg / dl Electronic Signature(s) Signed: 10/21/2022 11:47:11 AM By: Dayton Scrape Entered By: Dayton Scrape on 10/21/2022 10:18:18

## 2023-01-20 ENCOUNTER — Encounter (HOSPITAL_BASED_OUTPATIENT_CLINIC_OR_DEPARTMENT_OTHER): Payer: Medicare PPO | Attending: Internal Medicine | Admitting: Internal Medicine

## 2023-01-20 DIAGNOSIS — I10 Essential (primary) hypertension: Secondary | ICD-10-CM | POA: Diagnosis not present

## 2023-01-20 DIAGNOSIS — L89314 Pressure ulcer of right buttock, stage 4: Secondary | ICD-10-CM | POA: Diagnosis present

## 2023-01-20 DIAGNOSIS — G35 Multiple sclerosis: Secondary | ICD-10-CM | POA: Insufficient documentation

## 2023-01-20 DIAGNOSIS — L89324 Pressure ulcer of left buttock, stage 4: Secondary | ICD-10-CM | POA: Insufficient documentation

## 2023-01-20 DIAGNOSIS — M4628 Osteomyelitis of vertebra, sacral and sacrococcygeal region: Secondary | ICD-10-CM | POA: Diagnosis not present

## 2023-01-20 NOTE — Progress Notes (Signed)
[name], [name] ([id number]) [id number] Page 1 of 11 Visit Report for 01/20/2023 Arrival Information Details Patient Name: Date of Service: Julia LLA [name], [name] 01/20/2023 11:00 A M Medical Record Number: [id number] Patient Account Number: [id number] Date of Birth/Sex: Treating RN: 11/26/1970 (52 y.o. F) Primary Care Tejas [place]: Marletta Lor Other Clinician: Referring Chantee Cerino: Treating Ayriana Wix/Extender: Fransisca Kaufmann in Treatment: 95 Visit Information History Since Last Visit Added or deleted any medications: No Patient Arrived: Stretcher Any new allergies or adverse reactions: No Arrival Time: 11:03 Had a fall or experienced change in No Accompanied By: transport activities of daily living that may affect Transfer Assistance: None risk of falls: Patient Identification Verified: Yes Signs or symptoms of abuse/neglect since last visito No Secondary Verification Process Completed: Yes Hospitalized since last visit: No Patient Requires Transmission-Based Precautions: No Implantable device outside of the clinic excluding No Patient Has Alerts: No cellular tissue based products placed in the center since last visit: Pain Present Now: No Electronic Signature(s) Signed: 01/20/2023 3:24:46 PM By: Dayton Scrape Entered By: Dayton Scrape on 01/20/2023 11:04:25 -------------------------------------------------------------------------------- Clinic Level of Care Assessment Details Patient Name: Date of Service: Julia LLA [name], [name] 01/20/2023 11:00 A M Medical Record Number: [id number] Patient Account Number: [id number] Date of Birth/Sex: Treating RN: Dec 17, 1970 (52 y.o. [name], [name] Primary Care Kerrion Kemppainen: Marletta Lor Other Clinician: Referring Sander Speckman: Treating Hisham Provence/Extender: Fransisca Kaufmann in Treatment: 95 Clinic Level of Care Assessment Items TOOL 4 Quantity Score X- 1 0 Use when only an EandM is performed on  FOLLOW-UP visit ASSESSMENTS - Nursing Assessment / Reassessment X- 1 10 Reassessment of Co-morbidities (includes updates in patient status) X- 1 5 Reassessment of Adherence to Treatment Plan ASSESSMENTS - Wound and Skin A ssessment / Reassessment []  - 0 Simple Wound Assessment / Reassessment - one wound X- 2 5 Complex Wound Assessment / Reassessment - multiple wounds []  - 0 Dermatologic / Skin Assessment (not related to wound area) ASSESSMENTS - Focused Assessment []  - 0 Circumferential Edema Measurements - multi extremities []  - 0 Nutritional Assessment / Counseling / Intervention []  - 0 Lower Extremity Assessment (monofilament, tuning fork, pulses) [name], [name] ([id number]) [id number] Page 2 of 11 []  - 0 Peripheral Arterial Disease Assessment (using hand held doppler) ASSESSMENTS - Ostomy and/or Continence Assessment and Care []  - 0 Incontinence Assessment and Management []  - 0 Ostomy Care Assessment and Management (repouching, etc.) PROCESS - Coordination of Care []  - 0 Simple Patient / Family Education for ongoing care X- 1 20 Complex (extensive) Patient / Family Education for ongoing care X- 1 10 Staff obtains Chiropractor, Records, T Results / Process Orders est []  - 0 Staff telephones HHA, Nursing Homes / Clarify orders / etc []  - 0 Routine Transfer to another Facility (non-emergent condition) []  - 0 Routine Hospital Admission (non-emergent condition) []  - 0 New Admissions / Manufacturing engineer / Ordering NPWT Apligraf, etc. , []  - 0 Emergency Hospital Admission (emergent condition) []  - 0 Simple Discharge Coordination X- 1 15 Complex (extensive) Discharge Coordination PROCESS - Special Needs []  - 0 Pediatric / Minor Patient Management []  - 0 Isolation Patient Management []  - 0 Hearing / Language / Visual special needs X- 1 15 Assessment of Community assistance (transportation, D/C planning, etc.) []  - 0 Additional  assistance / Altered mentation X- 1 15 Support Surface(s) Assessment (bed, cushion, seat, etc.) INTERVENTIONS - Wound Cleansing / Measurement []  - 0 Simple Wound Cleansing - one wound X- 2 5 Complex Wound  Cleansing - multiple wounds X- 1 5 Wound Imaging (photographs - any number of wounds) []  - 0 Wound Tracing (instead of photographs) []  - 0 Simple Wound Measurement - one wound X- 2 5 Complex Wound Measurement - multiple wounds INTERVENTIONS - Wound Dressings X - Small Wound Dressing one or multiple wounds 1 10 X- 1 15 Medium Wound Dressing one or multiple wounds []  - 0 Large Wound Dressing one or multiple wounds []  - 0 Application of Medications - topical []  - 0 Application of Medications - injection INTERVENTIONS - Miscellaneous []  - 0 External ear exam []  - 0 Specimen Collection (cultures, biopsies, blood, body fluids, etc.) []  - 0 Specimen(s) / Culture(s) sent or taken to Lab for analysis []  - 0 Patient Transfer (multiple staff / Nurse, adult / Similar devices) []  - 0 Simple Staple / Suture removal (25 or less) []  - 0 Complex Staple / Suture removal (26 or more) []  - 0 Hypo / Hyperglycemic Management (close monitor of Blood Glucose) []  - 0 Ankle / Brachial Index (ABI) - do not check if billed separately [name], [name] ([id number]) [id number] Page 3 of 11 X- 1 5 Vital Signs Has the patient been seen at the hospital within the last three years: Yes Total Score: 155 Level Of Care: New/Established - Level 4 Electronic Signature(s) Signed: 01/20/2023 4:28:03 PM By: Shawn Stall RN, BSN Entered By: Shawn Stall on 01/20/2023 11:25:20 -------------------------------------------------------------------------------- Encounter Discharge Information Details Patient Name: Date of Service: Julia LLA [name], [name] 01/20/2023 11:00 A M Medical Record Number: [id number] Patient Account Number: [id number] Date of Birth/Sex: Treating RN: Jul 06, 1970 (52  y.o. [name] Primary Care Ronalee Scheunemann: Marletta Lor Other Clinician: Referring Anija Brickner: Treating Shanetha Bradham/Extender: Fransisca Kaufmann in Treatment: 95 Encounter Discharge Information Items Discharge Condition: Stable Ambulatory Status: Stretcher Discharge Destination: Home Transportation: Private Auto Accompanied By: self Schedule Follow-up Appointment: Yes Clinical Summary of Care: Electronic Signature(s) Signed: 01/20/2023 4:28:03 PM By: Shawn Stall RN, BSN Entered By: Shawn Stall on 01/20/2023 11:25:50 -------------------------------------------------------------------------------- Lower Extremity Assessment Details Patient Name: Date of Service: Julia LLA [name], [name] 01/20/2023 11:00 A M Medical Record Number: [id number] Patient Account Number: [id number] Date of Birth/Sex: Treating RN: 22-Apr-1970 (52 y.o. [name] Primary Care Zamyah Wiesman: Marletta Lor Other Clinician: Referring Jacquise Rarick: Treating Houston Zapien/Extender: Fransisca Kaufmann in Treatment: 95 Electronic Signature(s) Signed: 01/20/2023 4:28:03 PM By: Shawn Stall RN, BSN Entered By: Shawn Stall on 01/20/2023 11:20:44 -------------------------------------------------------------------------------- Multi Wound Chart Details Patient Name: Date of Service: Julia LLA [name], [name] 01/20/2023 11:00 A M Medical Record Number: [id number] Patient Account Number: [id number] Date of Birth/Sex: Treating RN: 01-Jun-1970 (52 y.o. Luzma Enea, Kae L ([id number]) [id number] Page 4 of 11 Primary Care Frayda Egley: Marletta Lor Other Clinician: Referring Shayleigh Bouldin: Treating Mouhamadou Gittleman/Extender: Fransisca Kaufmann in Treatment: 95 Vital Signs Height(in): 62 Pulse(bpm): 68 Weight(lbs): 145 Blood Pressure(mmHg): 136/85 Body Mass Index(BMI): 26.5 Temperature(F): 97.9 Respiratory Rate(breaths/min): 18 [1:Photos:] [N/A:N/A] Right Gluteus Left Ischium  N/A Wound Location: Pressure Injury Pressure Injury N/A Wounding Event: Pressure Ulcer Pressure Ulcer N/A Primary Etiology: Hypertension, Osteomyelitis, Hypertension, Osteomyelitis, N/A Comorbid History: Paraplegia Paraplegia 12/09/2020 09/30/2022 N/A Date Acquired: 95 13 N/A Weeks of Treatment: Open Open N/A Wound Status: No No N/A Wound Recurrence: 4x6x5 0.5x1x2 N/A Measurements L x W x D (cm) 18.85 0.393 N/A A (cm) : rea 94.248 0.785 N/A Volume (cm) : -6.70% 91.70% N/A % Reduction in A rea: -11.10% 58.40% N/A % Reduction in Volume: Category/Stage IV Category/Stage  IV N/A Classification: Medium Medium N/A Exudate A mount: Serosanguineous Serosanguineous N/A Exudate Type: red, brown red, brown N/A Exudate Color: Well defined, not attached Distinct, outline attached N/A Wound Margin: Large (67-100%) Large (67-100%) N/A Granulation A mount: Red, Pink, Friable Pink, Friable N/A Granulation Quality: None Present (0%) None Present (0%) N/A Necrotic A mount: Fat Layer (Subcutaneous Tissue): Yes Fat Layer (Subcutaneous Tissue): Yes N/A Exposed Structures: Muscle: Yes Muscle: Yes Fascia: No Fascia: No Tendon: No Tendon: No Joint: No Joint: No Bone: No Bone: No None Small (1-33%) N/A Epithelialization: Excoriation: No Excoriation: No N/A Periwound Skin Texture: Induration: No Induration: No Callus: No Callus: No Crepitus: No Crepitus: No Rash: No Rash: No Scarring: No Scarring: No Maceration: No Maceration: No N/A Periwound Skin Moisture: Dry/Scaly: No Dry/Scaly: No Atrophie Blanche: No Atrophie Blanche: No N/A Periwound Skin Color: Cyanosis: No Cyanosis: No Ecchymosis: No Ecchymosis: No Erythema: No Erythema: No Hemosiderin Staining: No Hemosiderin Staining: No Mottled: No Mottled: No Pallor: No Pallor: No Rubor: No Rubor: No Treatment Notes Wound #1 (Gluteus) Wound Laterality: Right Cleanser Vashe 5.8 (oz) Discharge Instruction:  Cleanse the wound with Vashe prior to applying a clean dressing using gauze sponges, not tissue or cotton balls. Peri-Wound Care Topical Primary Dressing DREAH, GOMBOS ([id number]) [phone].pdf Page 5 of 11 Fibracol Plus Dressing, 4x4.38 in (collagen) Discharge Instruction: Moisten collagen with saline or hydrogel saline wet to dry Discharge Instruction: saline moisten gauze backing the collagen. Secondary Dressing Woven Gauze Sponge, Non-Sterile 4x4 in Discharge Instruction: Apply over primary dressing as directed. Zetuvit Plus Silicone Border Sacrum Dressing, Sm, 7x7 (in/in) Discharge Instruction: Apply silicone border over primary dressing as directed. Secured With Compression Wrap Compression Stockings Add-Ons Wound #2 (Ischium) Wound Laterality: Left Cleanser Vashe 5.8 (oz) Discharge Instruction: Cleanse the wound with Vashe prior to applying a clean dressing using gauze sponges, not tissue or cotton balls. Peri-Wound Care Topical Primary Dressing Fibracol Plus Dressing, 4x4.38 in (collagen) Discharge Instruction: Moisten collagen with saline or hydrogel saline wet to dry Discharge Instruction: saline moisten gauze backing the collagen. Secondary Dressing Woven Gauze Sponge, Non-Sterile 4x4 in Discharge Instruction: Apply over primary dressing as directed. Zetuvit Plus Silicone Border Sacrum Dressing, Sm, 7x7 (in/in) Discharge Instruction: Apply silicone border over primary dressing as directed. Secured With Compression Wrap Compression Stockings Facilities manager) Signed: 01/20/2023 4:37:17 PM By: Baltazar Najjar MD Entered By: Baltazar Najjar on 01/20/2023 11:40:09 -------------------------------------------------------------------------------- Multi-Disciplinary Care Plan Details Patient Name: Date of Service: Julia LLA [name], Larisha L. 01/20/2023 11:00 A M Medical Record Number: [id number] Patient Account Number: [id number] Date  of Birth/Sex: Treating RN: 1971/01/05 (52 y.o. [name], [name] Primary Care Savana Spina: Marletta Lor Other Clinician: Referring Alianis Trimmer: Treating Savvas Roper/Extender: Fransisca Kaufmann in Treatment: [phone] Multidisciplinary Care Plan reviewed with physician [address] [name], [name] ([id number]) [id number] Page 6 of 11 Nutrition Nursing Diagnoses: Potential for alteratiion in Nutrition/Potential for imbalanced nutrition Goals: Patient/caregiver agrees to and verbalizes understanding of need to use nutritional supplements and/or vitamins as prescribed Date Initiated: 03/26/2021 Target Resolution Date: 04/11/2023 Goal Status: Active Interventions: Assess patient nutrition upon admission and as needed per policy Provide education on nutrition Treatment Activities: Education provided on Nutrition : 09/25/2021 Notes: Pressure Nursing Diagnoses: Knowledge deficit related to causes and risk factors for pressure ulcer development Knowledge deficit related to management of pressures ulcers Potential for impaired tissue integrity related to pressure, friction, moisture, and shear Goals: Patient/caregiver will verbalize risk factors for pressure ulcer development Date Initiated: 03/26/2021 Target Resolution Date:  05/01/2023 Goal Status: Active Patient/caregiver will verbalize understanding of pressure ulcer management Date Initiated: 03/26/2021 Date Inactivated: 02/04/2022 Target Resolution Date: 02/04/2022 Goal Status: Met Interventions: Assess: immobility, friction, shearing, incontinence upon admission and as needed Assess offloading mechanisms upon admission and as needed Assess potential for pressure ulcer upon admission and as needed Provide education on pressure ulcers Notes: 09/25/21: Uses air mattress, pressure relief ongoing. Wound/Skin Impairment Nursing Diagnoses: Impaired tissue integrity Knowledge deficit related to  ulceration/compromised skin integrity Goals: Patient/caregiver will verbalize understanding of skin care regimen Date Initiated: 03/26/2021 Target Resolution Date: 04/03/2023 Goal Status: Active Ulcer/skin breakdown will have a volume reduction of 30% by week 4 Date Initiated: 03/26/2021 Date Inactivated: 07/24/2021 Target Resolution Date: 04/27/2021 Goal Status: Met Interventions: Assess patient/caregiver ability to obtain necessary supplies Assess patient/caregiver ability to perform ulcer/skin care regimen upon admission and as needed Assess ulceration(s) every visit Provide education on ulcer and skin care Notes: 07/24/21: Pressure relief ongoing. Patient has low loss air mattress. 09/25/21: Wound care regimen continues, has HH assisting with dressing changes. Electronic Signature(s) Signed: 01/20/2023 4:28:03 PM By: Shawn Stall RN, BSN Entered By: Shawn Stall on 01/20/2023 11:22:26 [name] ([id number]) [id number] Page 7 of 11 -------------------------------------------------------------------------------- Pain Assessment Details Patient Name: Date of Service: Julia LLA [name], Jarrah L. 01/20/2023 11:00 A M Medical Record Number: [id number] Patient Account Number: [id number] Date of Birth/Sex: Treating RN: 1970/04/20 (52 y.o. [name] Primary Care Tracyann Duffell: Marletta Lor Other Clinician: Referring Kura Bethards: Treating Jakari Sada/Extender: Fransisca Kaufmann in Treatment: 95 Active Problems Location of Pain Severity and Description of Pain Patient Has Paino No Site Locations Pain Management and Medication Current Pain Management: Electronic Signature(s) Signed: 01/20/2023 4:28:03 PM By: Shawn Stall RN, BSN Entered By: Shawn Stall on 01/20/2023 11:20:39 -------------------------------------------------------------------------------- Patient/Caregiver Education Details Patient Name: Date of Service: Julia LLA [name], [name]  11/11/2024andnbsp11:00 A M Medical Record Number: [id number] Patient Account Number: [id number] Date of Birth/Gender: Treating RN: June 09, 1970 (52 y.o. [name] Primary Care Physician: Marletta Lor Other Clinician: Referring Physician: Treating Physician/Extender: Fransisca Kaufmann in Treatment: 95 Education Assessment Education Provided To: Patient Education Topics Provided Wound/Skin Impairment: Handouts: Caring for Your Ulcer Methods: Explain/Verbal Responses: Reinforcements needed DESHONDRA, ANSELL ([id number]) [phone].pdf Page 8 of 11 Electronic Signature(s) Signed: 01/20/2023 4:28:03 PM By: Shawn Stall RN, BSN Entered By: Shawn Stall on 01/20/2023 11:22:39 -------------------------------------------------------------------------------- Wound Assessment Details Patient Name: Date of Service: Julia LLA [name], Ladora L. 01/20/2023 11:00 A M Medical Record Number: [id number] Patient Account Number: [id number] Date of Birth/Sex: Treating RN: [birth year] (52 y.o. [name], [name] Primary Care Ayahna Solazzo: Marletta Lor Other Clinician: Referring Wynona Duhamel: Treating Mildred Bollard/Extender: Fransisca Kaufmann in Treatment: 95 Wound Status Wound Number: 1 Primary Etiology: Pressure Ulcer Wound Location: Right Gluteus Wound Status: Open Wounding Event: Pressure Injury Comorbid History: Hypertension, Osteomyelitis, Paraplegia Date Acquired: 12/09/2020 Weeks Of Treatment: 95 Clustered Wound: No Photos Wound Measurements Length: (cm) 4 Width: (cm) 6 Depth: (cm) 5 Area: (cm) 18.85 Volume: (cm) 94.248 % Reduction in Area: -6.7% % Reduction in Volume: -11.1% Epithelialization: None Tunneling: No Undermining: No Wound Description Classification: Category/Stage IV Wound Margin: Well defined, not attached Exudate Amount: Medium Exudate Type: Serosanguineous Exudate Color: red, brown Foul Odor After Cleansing: No Slough/Fibrino  No Wound Bed Granulation Amount: Large (67-100%) Exposed Structure Granulation Quality: Red, Pink, Friable Fascia Exposed: No Necrotic Amount: None Present (0%) Fat Layer (Subcutaneous Tissue) Exposed: Yes Tendon Exposed: No Muscle Exposed: Yes Necrosis of Muscle: No Joint  Exposed: No Bone Exposed: No Periwound Skin Texture Texture Color No Abnormalities Noted: No No Abnormalities Noted: No Callus: No Atrophie Blanche: No [name], [name] ([id number]) [id number] Page 9 of 11 Crepitus: No Cyanosis: No Excoriation: No Ecchymosis: No Induration: No Erythema: No Rash: No Hemosiderin Staining: No Scarring: No Mottled: No Pallor: No Moisture Rubor: No No Abnormalities Noted: No Dry / Scaly: No Maceration: No Treatment Notes Wound #1 (Gluteus) Wound Laterality: Right Cleanser Vashe 5.8 (oz) Discharge Instruction: Cleanse the wound with Vashe prior to applying a clean dressing using gauze sponges, not tissue or cotton balls. Peri-Wound Care Topical Primary Dressing Fibracol Plus Dressing, 4x4.38 in (collagen) Discharge Instruction: Moisten collagen with saline or hydrogel saline wet to dry Discharge Instruction: saline moisten gauze backing the collagen. Secondary Dressing Woven Gauze Sponge, Non-Sterile 4x4 in Discharge Instruction: Apply over primary dressing as directed. Zetuvit Plus Silicone Border Sacrum Dressing, Sm, 7x7 (in/in) Discharge Instruction: Apply silicone border over primary dressing as directed. Secured With Compression Wrap Compression Stockings Facilities manager) Signed: 01/20/2023 4:28:03 PM By: Shawn Stall RN, BSN Entered By: Shawn Stall on 01/20/2023 11:12:06 -------------------------------------------------------------------------------- Wound Assessment Details Patient Name: Date of Service: Julia LLA [name], [name] 01/20/2023 11:00 A M Medical Record Number: [id number] Patient Account Number:  [id number] Date of Birth/Sex: Treating RN: 01/01/1971 (52 y.o. [name] Primary Care Rogina Schiano: Marletta Lor Other Clinician: Referring Zanovia Rotz: Treating Gregrey Bloyd/Extender: Fransisca Kaufmann in Treatment: 95 Wound Status Wound Number: 2 Primary Etiology: Pressure Ulcer Wound Location: Left Ischium Wound Status: Open Wounding Event: Pressure Injury Comorbid History: Hypertension, Osteomyelitis, Paraplegia Date Acquired: 09/30/2022 Weeks Of Treatment: 13 Clustered Wound: No Photos ZAMIAH, VANARTSDALEN ([id number]) [phone] Page 10 of 11 Wound Measurements Length: (cm) 0.5 Width: (cm) 1 Depth: (cm) 2 Area: (cm) 0.393 Volume: (cm) 0.785 % Reduction in Area: 91.7% % Reduction in Volume: 58.4% Epithelialization: Small (1-33%) Tunneling: No Undermining: No Wound Description Classification: Category/Stage IV Wound Margin: Distinct, outline attached Exudate Amount: Medium Exudate Type: Serosanguineous Exudate Color: red, brown Foul Odor After Cleansing: No Slough/Fibrino Yes Wound Bed Granulation Amount: Large (67-100%) Exposed Structure Granulation Quality: Pink, Friable Fascia Exposed: No Necrotic Amount: None Present (0%) Fat Layer (Subcutaneous Tissue) Exposed: Yes Tendon Exposed: No Muscle Exposed: Yes Necrosis of Muscle: No Joint Exposed: No Bone Exposed: No Periwound Skin Texture Texture Color No Abnormalities Noted: No No Abnormalities Noted: No Callus: No Atrophie Blanche: No Crepitus: No Cyanosis: No Excoriation: No Ecchymosis: No Induration: No Erythema: No Rash: No Hemosiderin Staining: No Scarring: No Mottled: No Pallor: No Moisture Rubor: No No Abnormalities Noted: No Dry / Scaly: No Maceration: No Treatment Notes Wound #2 (Ischium) Wound Laterality: Left Cleanser Vashe 5.8 (oz) Discharge Instruction: Cleanse the wound with Vashe prior to applying a clean dressing using gauze sponges, not  tissue or cotton balls. Peri-Wound Care Topical Primary Dressing Fibracol Plus Dressing, 4x4.38 in (collagen) Discharge Instruction: Moisten collagen with saline or hydrogel saline wet to dry Discharge Instruction: saline moisten gauze backing the collagen. Secondary Dressing Woven Gauze Sponge, Non-Sterile 4x4 in Discharge Instruction: Apply over primary dressing as directed. Zetuvit Plus Silicone Border Sacrum Dressing, Sm, 7x7 (in/in) Discharge Instruction: Apply silicone border over primary dressing as directed. BEXLEE, LOOPER ([id number]) [id number] Page 11 of 11 Secured With Compression Wrap Compression Stockings Facilities manager) Signed: 01/20/2023 4:28:03 PM By: Shawn Stall RN, BSN Entered By: Shawn Stall on 01/20/2023 11:13:46 -------------------------------------------------------------------------------- Vitals Details Patient Name: Date of Service: Julia LLA [name], Glorianne L. 01/20/2023 11:00  A M Medical Record Number: [id number] Patient Account Number: [id number] Date of Birth/Sex: Treating RN: 10/05/1970 (52 y.o. F) Primary Care Valorie Mcgrory: Marletta Lor Other Clinician: Referring Petrina Melby: Treating Marthann Abshier/Extender: Fransisca Kaufmann in Treatment: 95 Vital Signs Time Taken: 11:04 Temperature (F): 97.9 Height (in): 62 Pulse (bpm): 68 Weight (lbs): 145 Respiratory Rate (breaths/min): 18 Body Mass Index (BMI): 26.5 Blood Pressure (mmHg): 136/85 Reference Range: 80 - 120 mg / dl Electronic Signature(s) Signed: 01/20/2023 3:24:46 PM By: Dayton Scrape Entered By: Dayton Scrape on 01/20/2023 11:04:54

## 2023-01-20 NOTE — Progress Notes (Signed)
ITATY, STRENGTH ([id number]) [phone].pdf Page 1 of 6 Visit Report for 01/20/2023 HPI Details Patient Name: Date of Service: [name], [name]. 01/20/2023 11:00 A M Medical Record Number: [id number] Patient Account Number: [id number] Date of Birth/Sex: Treating RN: [birth year] (52 y.o. F) Primary Care Provider: Marletta Lor Other Clinician: Referring Provider: Treating Provider/Extender: Fransisca Kaufmann in Treatment: 95 History of Present Illness HPI Description: Admission 03/26/2021 [name] is a 52 year old female with a past medical history of multiple sclerosis for the past 16 years, complicated by severe debility and bedbound since 2015 that presents to the clinic for a 45-month history of ulcer to the right buttocks. She has been admitted to the hospital several times for this issue. She was last hospitalized on 02/18/2021 for septic shock due to the infected ulcer. She was evaluated by infectious disease at that time. She completed a 2- week course of antibiotics. There is no plan for follow-up. She is currently using calcium alginate dressings. She has home health that comes out twice weekly. She currently denies signs of infection. She has an air mattress for offloading. Of note She has a history of osteomyelitis to the sacrum and treated in 2020 with IV antibiotics for 8 weeks. 3/21; patient presents for follow-up. She has been using calcium alginate to the wound bed and using Vashe to help clean prior to dressing changes. She has no issues or complaints today. 5/16; patient presents for follow-up. She has been using calcium alginate to the wound bed. She has no issues or complaints today. She is not able to aggressively offload the area due to her multiple sclerosis. 7/18; patient presents for follow-up. She had a televisit with Dr. Luciana Axe on 08/03/2021 for possible osteomyelitis noted on x-ray. She was started on doxycycline and  Levaquin and completed her 4-week course. She has been using calcium alginate to the wound bed. She has no issues or complaints today. 11/27; patient missed her last clinic appointment. She was last seen 4 months ago. She has a chronic wound to her right buttocks that has been treated for osteomyelitis. She has been using calcium alginate to the wound bed. She uses a wound cleanser in between dressing changes. She tries to aggressively offload the area. She states that due to cost she cannot come in frequently. 2/19; patient presents for follow-up. She follows in our clinic every 2 to 3 months for her chronic osteomyelitis sacral wound. She has been using calcium alginate and cleaning the area with Vashe. She has no issues or complaints today. She states she would like to follow in our clinic every 3 months and cannot do so sooner due to cost of transportation. 5/13; patient presents for follow-up. She follows in our clinic every 3 months for chronic osteomyelitis to the sacrum. She states she was just recently released by home health and would like to try and reestablish care with a new agency. She has been using calcium alginate to the wound bed. She has been cleaning it with Vashe. 10/21/2022; patient presents for follow-up. She usually follows in our clinic every 2 to 3 months for her chronic osteomyelitis to the sacrum/right buttocks. Due to transportation she cannot come sooner. Unfortunately she has developed a new wound to her left ischium about 3 weeks ago. She has been using alginate dressings to this. She does not offload the wound beds. 01/20/2023; patient returns for an every 58-month follow-up. She states she cannot come your more often than that because  of ambulance costs for transportation among other reasons. She has been using Vashe wet-to-dry for a long period of time now. She has a substantial area over the right ischial tuberosity and a smaller area over the left ischial  tuberosity. Electronic Signature(s) Signed: 01/20/2023 4:37:17 PM By: Baltazar Najjar MD Entered By: Baltazar Najjar on 01/20/2023 08:41:40 -------------------------------------------------------------------------------- Physical Exam Details Patient Name: Date of Service: [name], [name]. 01/20/2023 11:00 A M Medical Record Number: [id number] Patient Account Number: [id number] Date of Birth/Sex: Treating RN: 10/24/1970 (52 y.o. F) Primary Care Provider: Marletta Lor Other Clinician: Referring Provider: Treating Provider/Extender: Fransisca Kaufmann in Treatment: [address] [place] ([id number]) [id number] Page 2 of 6 Constitutional Sitting or standing Blood Pressure is within target range for patient.. Pulse regular and within target range for patient.Marland Kitchen Respirations regular, non-labored and within target range.. Temperature is normal and within the target range for the patient.Marland Kitchen Appears in no distress. Notes Wound exam; right buttock. This is the more substantial wound. The ischial tuberosity itself is not exposed there is granulation tissue here. No evidence of soft tissue infection. Left buttock I think this is close then somewhat by previous comparison. Electronic Signature(s) Signed: 01/20/2023 4:37:17 PM By: Baltazar Najjar MD Entered By: Baltazar Najjar on 01/20/2023 08:42:58 -------------------------------------------------------------------------------- Physician Orders Details Patient Name: Date of Service: [name], [name]. 01/20/2023 11:00 A M Medical Record Number: [id number] Patient Account Number: [id number] Date of Birth/Sex: Treating RN: 31-Mar-1970 (52 y.o. [name] Primary Care Provider: Marletta Lor Other Clinician: Referring Provider: Treating Provider/Extender: Fransisca Kaufmann in Treatment: 95 The following information was scribed by: Shawn Stall The information was scribed for: Baltazar Najjar Verbal / Phone Orders: No Diagnosis Coding Follow-up Appointments ppointment in: - 3 months follow up with Dr. Mikey Bussing ******STRETCHER ROOM***** Return A Anesthetic (In clinic) Topical Lidocaine 4% applied to wound bed Off-Loading Low air-loss mattress (Group 2) Turn and reposition every 2 hours Wound Treatment Wound #1 - Gluteus Wound Laterality: Right Cleanser: Vashe 5.8 (oz) (Home Health) 1 x Per Day/Other:3 months Discharge Instructions: Cleanse the wound with Vashe prior to applying a clean dressing using gauze sponges, not tissue or cotton balls. Prim Dressing: Fibracol Plus Dressing, 4x4.38 in (collagen) 1 x Per Day/Other:3 months ary Discharge Instructions: Moisten collagen with saline or hydrogel Prim Dressing: saline wet to dry 1 x Per Day/Other:3 months ary Discharge Instructions: saline moisten gauze backing the collagen. Secondary Dressing: Woven Gauze Sponge, Non-Sterile 4x4 in (Home Health) 1 x Per Day/Other:3 months Discharge Instructions: Apply over primary dressing as directed. Secondary Dressing: Zetuvit Plus Silicone Border Sacrum Dressing, Sm, 7x7 (in/in) (Home Health) 1 x Per Day/Other:3 months Discharge Instructions: Apply silicone border over primary dressing as directed. Wound #2 - Ischium Wound Laterality: Left Cleanser: Vashe 5.8 (oz) (Home Health) 1 x Per Day/Other:3 months Discharge Instructions: Cleanse the wound with Vashe prior to applying a clean dressing using gauze sponges, not tissue or cotton balls. Prim Dressing: Fibracol Plus Dressing, 4x4.38 in (collagen) 1 x Per Day/Other:3 months ary Discharge Instructions: Moisten collagen with saline or hydrogel Prim Dressing: saline wet to dry 1 x Per Day/Other:3 months ary Discharge Instructions: saline moisten gauze backing the collagen. Secondary Dressing: Woven Gauze Sponge, Non-Sterile 4x4 in (Home Health) 1 x Per Day/Other:3 months Discharge Instructions: Apply over primary dressing as  directed. [name], [name] ([id number]) [phone].pdf Page 3 of 6 Secondary Dressing: Zetuvit Plus Silicone Border Sacrum Dressing, Sm, 7x7 (in/in) (Home Health) 1  x Per Day/Other:3 months Discharge Instructions: Apply silicone border over primary dressing as directed. Electronic Signature(s) Signed: 01/20/2023 4:28:03 PM By: Shawn Stall RN, BSN Signed: 01/20/2023 4:37:17 PM By: Baltazar Najjar MD Entered By: Shawn Stall on 01/20/2023 08:24:05 -------------------------------------------------------------------------------- Problem List Details Patient Name: Date of Service: [name], Jadi [name]. 01/20/2023 11:00 A M Medical Record Number: [id number] Patient Account Number: [id number] Date of Birth/Sex: Treating RN: 1970/10/30 (52 y.o. F) Primary Care Provider: Marletta Lor Other Clinician: Referring Provider: Treating Provider/Extender: Fransisca Kaufmann in Treatment: [id number] Active Problems ICD-10 Encounter Code Description Active Date MDM Diagnosis L89.314 Pressure ulcer of right buttock, stage 4 03/26/2021 No Yes L89.324 Pressure ulcer of left buttock, stage 4 10/21/2022 No Yes G35 Multiple sclerosis 03/26/2021 No Yes M46.28 Osteomyelitis of vertebra, sacral and sacrococcygeal region 03/26/2021 No Yes Inactive Problems Resolved Problems Electronic Signature(s) Signed: 01/20/2023 4:37:17 PM By: Baltazar Najjar MD Entered By: Baltazar Najjar on 01/20/2023 08:38:51 -------------------------------------------------------------------------------- Progress Note Details Patient Name: Date of Service: [name], Lahna [name]. 01/20/2023 11:00 A M Medical Record Number: [id number] Patient Account Number: [id number] Date of Birth/Sex: Treating RN: [birth year] (52 y.o. F) Primary Care Provider: Marletta Lor Other Clinician: Referring Provider: Treating Provider/Extender: Fransisca Kaufmann in Treatment: [address], [place] ([id number])  [id number] Page 4 of 6 Subjective History of Present Illness (HPI) Admission 03/26/2021 Ms. Camill Diprima is a 52 year old female with a past medical history of multiple sclerosis for the past 16 years, complicated by severe debility and bedbound since 2015 that presents to the clinic for a 54-month history of ulcer to the right buttocks. She has been admitted to the hospital several times for this issue. She was last hospitalized on 02/18/2021 for septic shock due to the infected ulcer. She was evaluated by infectious disease at that time. She completed a 2- week course of antibiotics. There is no plan for follow-up. She is currently using calcium alginate dressings. She has home health that comes out twice weekly. She currently denies signs of infection. She has an air mattress for offloading. Of note She has a history of osteomyelitis to the sacrum and treated in 2020 with IV antibiotics for 8 weeks. 3/21; patient presents for follow-up. She has been using calcium alginate to the wound bed and using Vashe to help clean prior to dressing changes. She has no issues or complaints today. 5/16; patient presents for follow-up. She has been using calcium alginate to the wound bed. She has no issues or complaints today. She is not able to aggressively offload the area due to her multiple sclerosis. 7/18; patient presents for follow-up. She had a televisit with Dr. Luciana Axe on 08/03/2021 for possible osteomyelitis noted on x-ray. She was started on doxycycline and Levaquin and completed her 4-week course. She has been using calcium alginate to the wound bed. She has no issues or complaints today. 11/27; patient missed her last clinic appointment. She was last seen 4 months ago. She has a chronic wound to her right buttocks that has been treated for osteomyelitis. She has been using calcium alginate to the wound bed. She uses a wound cleanser in between dressing changes. She tries to  aggressively offload the area. She states that due to cost she cannot come in frequently. 2/19; patient presents for follow-up. She follows in our clinic every 2 to 3 months for her chronic osteomyelitis sacral wound. She has been using calcium alginate and cleaning the area with Vashe. She has no issues or  complaints today. She states she would like to follow in our clinic every 3 months and cannot do so sooner due to cost of transportation. 5/13; patient presents for follow-up. She follows in our clinic every 3 months for chronic osteomyelitis to the sacrum. She states she was just recently released by home health and would like to try and reestablish care with a new agency. She has been using calcium alginate to the wound bed. She has been cleaning it with Vashe. 10/21/2022; patient presents for follow-up. She usually follows in our clinic every 2 to 3 months for her chronic osteomyelitis to the sacrum/right buttocks. Due to transportation she cannot come sooner. Unfortunately she has developed a new wound to her left ischium about 3 weeks ago. She has been using alginate dressings to this. She does not offload the wound beds. 01/20/2023; patient returns for an every 17-month follow-up. She states she cannot come your more often than that because of ambulance costs for transportation among other reasons. She has been using Vashe wet-to-dry for a long period of time now. She has a substantial area over the right ischial tuberosity and a smaller area over the left ischial tuberosity. Objective Constitutional Sitting or standing Blood Pressure is within target range for patient.. Pulse regular and within target range for patient.Marland Kitchen Respirations regular, non-labored and within target range.. Temperature is normal and within the target range for the patient.Marland Kitchen Appears in no distress. Vitals Time Taken: 11:04 AM, Height: 62 in, Weight: 145 lbs, BMI: 26.5, Temperature: 97.9 F, Pulse: 68 bpm, Respiratory  Rate: 18 breaths/min, Blood Pressure: 136/85 mmHg. General Notes: Wound exam; right buttock. This is the more substantial wound. The ischial tuberosity itself is not exposed there is granulation tissue here. No evidence of soft tissue infection. Left buttock I think this is close then somewhat by previous comparison. Integumentary (Hair, Skin) Wound #1 status is Open. Original cause of wound was Pressure Injury. The date acquired was: 12/09/2020. The wound has been in treatment 95 weeks. The wound is located on the Right Gluteus. The wound measures 4cm length x 6cm width x 5cm depth; 18.85cm^2 area and 94.248cm^3 volume. There is muscle and Fat Layer (Subcutaneous Tissue) exposed. There is no tunneling or undermining noted. There is a medium amount of serosanguineous drainage noted. The wound margin is well defined and not attached to the wound base. There is large (67-100%) red, pink, friable granulation within the wound bed. There is no necrotic tissue within the wound bed. The periwound skin appearance did not exhibit: Callus, Crepitus, Excoriation, Induration, Rash, Scarring, Dry/Scaly, Maceration, Atrophie Blanche, Cyanosis, Ecchymosis, Hemosiderin Staining, Mottled, Pallor, Rubor, Erythema. Wound #2 status is Open. Original cause of wound was Pressure Injury. The date acquired was: 09/30/2022. The wound has been in treatment 13 weeks. The wound is located on the Left Ischium. The wound measures 0.5cm length x 1cm width x 2cm depth; 0.393cm^2 area and 0.785cm^3 volume. There is muscle and Fat Layer (Subcutaneous Tissue) exposed. There is no tunneling or undermining noted. There is a medium amount of serosanguineous drainage noted. The wound margin is distinct with the outline attached to the wound base. There is large (67-100%) pink, friable granulation within the wound bed. There is no necrotic tissue within the wound bed. The periwound skin appearance did not exhibit: Callus, Crepitus,  Excoriation, Induration, Rash, Scarring, Dry/Scaly, Maceration, Atrophie Blanche, Cyanosis, Ecchymosis, Hemosiderin Staining, Mottled, Pallor, Rubor, Erythema. Assessment Active Problems ICD-10 Pressure ulcer of right buttock, stage 4 Pressure ulcer of left  buttock, stage 4 Multiple sclerosis [name], [name] ([id number]) [id number].pdf Page 5 of 6 Osteomyelitis of vertebra, sacral and sacrococcygeal region Plan Follow-up Appointments: Return Appointment in: - 3 months follow up with Dr. Mikey Bussing ******STRETCHER ROOM***** Anesthetic: (In clinic) Topical Lidocaine 4% applied to wound bed Off-Loading: Low air-loss mattress (Group 2) Turn and reposition every 2 hours WOUND #1: - Gluteus Wound Laterality: Right Cleanser: Vashe 5.8 (oz) (Home Health) 1 x Per Day/Other:3 months Discharge Instructions: Cleanse the wound with Vashe prior to applying a clean dressing using gauze sponges, not tissue or cotton balls. Prim Dressing: Fibracol Plus Dressing, 4x4.38 in (collagen) 1 x Per Day/Other:3 months ary Discharge Instructions: Moisten collagen with saline or hydrogel Prim Dressing: saline wet to dry 1 x Per Day/Other:3 months ary Discharge Instructions: saline moisten gauze backing the collagen. Secondary Dressing: Woven Gauze Sponge, Non-Sterile 4x4 in (Home Health) 1 x Per Day/Other:3 months Discharge Instructions: Apply over primary dressing as directed. Secondary Dressing: Zetuvit Plus Silicone Border Sacrum Dressing, Sm, 7x7 (in/in) (Home Health) 1 x Per Day/Other:3 months Discharge Instructions: Apply silicone border over primary dressing as directed. WOUND #2: - Ischium Wound Laterality: Left Cleanser: Vashe 5.8 (oz) (Home Health) 1 x Per Day/Other:3 months Discharge Instructions: Cleanse the wound with Vashe prior to applying a clean dressing using gauze sponges, not tissue or cotton balls. Prim Dressing: Fibracol Plus Dressing, 4x4.38 in (collagen) 1 x Per  Day/Other:3 months ary Discharge Instructions: Moisten collagen with saline or hydrogel Prim Dressing: saline wet to dry 1 x Per Day/Other:3 months ary Discharge Instructions: saline moisten gauze backing the collagen. Secondary Dressing: Woven Gauze Sponge, Non-Sterile 4x4 in (Home Health) 1 x Per Day/Other:3 months Discharge Instructions: Apply over primary dressing as directed. Secondary Dressing: Zetuvit Plus Silicone Border Sacrum Dressing, Sm, 7x7 (in/in) (Home Health) 1 x Per Day/Other:3 months Discharge Instructions: Apply silicone border over primary dressing as directed. 1. We change the primary dressing to regular collagen [silver alginate] with backing wet to dry in both wound areas. The issue here will be to see if we can stimulate some granulation on the right ischial tuberosity wound. 2. Fortunately no evidence of infection 3. The patient states she simply cannot come more frequently this largely because of transportation costs. 4. If she cannot afford the collagen for some reason then we will move back to a Vashe wet-to-dry Electronic Signature(s) Signed: 01/20/2023 4:37:17 PM By: Baltazar Najjar MD Entered By: Baltazar Najjar on 01/20/2023 08:44:26 -------------------------------------------------------------------------------- SuperBill Details Patient Name: Date of Service: [name], [name]. 01/20/2023 Medical Record Number: [id number] Patient Account Number: [id number] Date of Birth/Sex: Treating RN: [birth year] (52 y.o. [name] Primary Care Provider: Marletta Lor Other Clinician: Referring Provider: Treating Provider/Extender: Fransisca Kaufmann in Treatment: 95 Diagnosis Coding ICD-10 Codes Code Description L89.314 Pressure ulcer of right buttock, stage 4 L89.324 Pressure ulcer of left buttock, stage 4 G35 Multiple sclerosis M46.28 Osteomyelitis of vertebra, sacral and sacrococcygeal region Facility Procedures Julia, DABDOUB  ([id number]): CPT4 Code Description [id number] - WOUND CARE VISIT-LEV 4 EST PT [id number] Page 6 of 6: Modifier Quantity 1 Physician Procedures : CPT4 Code Description Modifier [id number] - WC PHYS LEVEL 3 - EST PT ICD-10 Diagnosis Description L89.314 Pressure ulcer of right buttock, stage 4 L89.324 Pressure ulcer of left buttock, stage 4 G35 Multiple sclerosis Quantity: 1 Electronic Signature(s) Signed: 01/20/2023 4:37:17 PM By: Baltazar Najjar MD Entered By: Baltazar Najjar on 01/20/2023 08:44:44

## 2023-03-24 ENCOUNTER — Encounter (HOSPITAL_BASED_OUTPATIENT_CLINIC_OR_DEPARTMENT_OTHER): Admitting: Internal Medicine

## 2023-04-01 ENCOUNTER — Ambulatory Visit (HOSPITAL_BASED_OUTPATIENT_CLINIC_OR_DEPARTMENT_OTHER): Payer: Medicare PPO | Admitting: Internal Medicine

## 2023-04-15 ENCOUNTER — Encounter (HOSPITAL_BASED_OUTPATIENT_CLINIC_OR_DEPARTMENT_OTHER): Payer: Medicare PPO | Attending: Internal Medicine | Admitting: Internal Medicine

## 2023-04-15 DIAGNOSIS — L89324 Pressure ulcer of left buttock, stage 4: Secondary | ICD-10-CM | POA: Diagnosis not present

## 2023-04-15 DIAGNOSIS — L89314 Pressure ulcer of right buttock, stage 4: Secondary | ICD-10-CM | POA: Insufficient documentation

## 2023-04-15 DIAGNOSIS — G35 Multiple sclerosis: Secondary | ICD-10-CM | POA: Diagnosis not present

## 2023-04-15 DIAGNOSIS — M4628 Osteomyelitis of vertebra, sacral and sacrococcygeal region: Secondary | ICD-10-CM | POA: Diagnosis not present

## 2023-06-13 ENCOUNTER — Emergency Department (HOSPITAL_COMMUNITY)

## 2023-06-13 ENCOUNTER — Other Ambulatory Visit: Payer: Self-pay

## 2023-06-13 ENCOUNTER — Inpatient Hospital Stay (HOSPITAL_COMMUNITY)
Admission: EM | Admit: 2023-06-13 | Discharge: 2023-06-18 | DRG: 987 | Disposition: A | Discharge status: Home Health | Attending: Internal Medicine | Admitting: Internal Medicine

## 2023-06-13 DIAGNOSIS — Z87891 Personal history of nicotine dependence: Secondary | ICD-10-CM | POA: Diagnosis not present

## 2023-06-13 DIAGNOSIS — G9341 Metabolic encephalopathy: Principal | ICD-10-CM | POA: Diagnosis present

## 2023-06-13 DIAGNOSIS — E66813 Obesity, class 3: Secondary | ICD-10-CM | POA: Diagnosis present

## 2023-06-13 DIAGNOSIS — S91002A Unspecified open wound, left ankle, initial encounter: Secondary | ICD-10-CM | POA: Diagnosis not present

## 2023-06-13 DIAGNOSIS — Z87440 Personal history of urinary (tract) infections: Secondary | ICD-10-CM | POA: Diagnosis not present

## 2023-06-13 DIAGNOSIS — Z6841 Body Mass Index (BMI) 40.0 and over, adult: Secondary | ICD-10-CM

## 2023-06-13 DIAGNOSIS — N390 Urinary tract infection, site not specified: Secondary | ICD-10-CM | POA: Diagnosis present

## 2023-06-13 DIAGNOSIS — G35 Multiple sclerosis: Secondary | ICD-10-CM | POA: Diagnosis present

## 2023-06-13 DIAGNOSIS — L89314 Pressure ulcer of right buttock, stage 4: Secondary | ICD-10-CM | POA: Diagnosis present

## 2023-06-13 DIAGNOSIS — L8994 Pressure ulcer of unspecified site, stage 4: Secondary | ICD-10-CM | POA: Diagnosis present

## 2023-06-13 DIAGNOSIS — D638 Anemia in other chronic diseases classified elsewhere: Secondary | ICD-10-CM | POA: Diagnosis present

## 2023-06-13 DIAGNOSIS — L89154 Pressure ulcer of sacral region, stage 4: Secondary | ICD-10-CM | POA: Diagnosis present

## 2023-06-13 DIAGNOSIS — E440 Moderate protein-calorie malnutrition: Secondary | ICD-10-CM | POA: Diagnosis present

## 2023-06-13 DIAGNOSIS — M4628 Osteomyelitis of vertebra, sacral and sacrococcygeal region: Secondary | ICD-10-CM | POA: Diagnosis present

## 2023-06-13 DIAGNOSIS — I10 Essential (primary) hypertension: Secondary | ICD-10-CM | POA: Diagnosis present

## 2023-06-13 DIAGNOSIS — R4182 Altered mental status, unspecified: Secondary | ICD-10-CM | POA: Diagnosis present

## 2023-06-13 DIAGNOSIS — L89524 Pressure ulcer of left ankle, stage 4: Secondary | ICD-10-CM | POA: Diagnosis present

## 2023-06-13 DIAGNOSIS — S81802A Unspecified open wound, left lower leg, initial encounter: Secondary | ICD-10-CM | POA: Diagnosis not present

## 2023-06-13 DIAGNOSIS — N3000 Acute cystitis without hematuria: Secondary | ICD-10-CM | POA: Diagnosis not present

## 2023-06-13 DIAGNOSIS — Z79899 Other long term (current) drug therapy: Secondary | ICD-10-CM | POA: Diagnosis not present

## 2023-06-13 DIAGNOSIS — M8580 Other specified disorders of bone density and structure, unspecified site: Secondary | ICD-10-CM | POA: Diagnosis present

## 2023-06-13 DIAGNOSIS — S82842G Displaced bimalleolar fracture of left lower leg, subsequent encounter for closed fracture with delayed healing: Secondary | ICD-10-CM | POA: Diagnosis not present

## 2023-06-13 DIAGNOSIS — R410 Disorientation, unspecified: Secondary | ICD-10-CM | POA: Diagnosis not present

## 2023-06-13 DIAGNOSIS — Z91048 Other nonmedicinal substance allergy status: Secondary | ICD-10-CM | POA: Diagnosis not present

## 2023-06-13 DIAGNOSIS — Z888 Allergy status to other drugs, medicaments and biological substances status: Secondary | ICD-10-CM

## 2023-06-13 DIAGNOSIS — Z91018 Allergy to other foods: Secondary | ICD-10-CM

## 2023-06-13 DIAGNOSIS — G8929 Other chronic pain: Secondary | ICD-10-CM | POA: Diagnosis present

## 2023-06-13 DIAGNOSIS — E8809 Other disorders of plasma-protein metabolism, not elsewhere classified: Secondary | ICD-10-CM | POA: Diagnosis present

## 2023-06-13 DIAGNOSIS — L8990 Pressure ulcer of unspecified site, unspecified stage: Secondary | ICD-10-CM | POA: Diagnosis not present

## 2023-06-13 LAB — URINALYSIS, W/ REFLEX TO CULTURE (INFECTION SUSPECTED)
Bilirubin Urine: NEGATIVE
Glucose, UA: NEGATIVE mg/dL
Hgb urine dipstick: NEGATIVE
Ketones, ur: NEGATIVE mg/dL
Nitrite: NEGATIVE
Protein, ur: NEGATIVE mg/dL
Specific Gravity, Urine: 1.012 (ref 1.005–1.030)
pH: 5 (ref 5.0–8.0)

## 2023-06-13 LAB — COMPREHENSIVE METABOLIC PANEL WITH GFR
ALT: 12 U/L (ref 0–44)
AST: 21 U/L (ref 15–41)
Albumin: 2.5 g/dL — ABNORMAL LOW (ref 3.5–5.0)
Alkaline Phosphatase: 82 U/L (ref 38–126)
Anion gap: 11 (ref 5–15)
BUN: 19 mg/dL (ref 6–20)
CO2: 23 mmol/L (ref 22–32)
Calcium: 8.7 mg/dL — ABNORMAL LOW (ref 8.9–10.3)
Chloride: 107 mmol/L (ref 98–111)
Creatinine, Ser: 0.56 mg/dL (ref 0.44–1.00)
GFR, Estimated: >60 mL/min (ref >60)
Glucose, Bld: 109 mg/dL — ABNORMAL HIGH (ref 70–99)
Potassium: 3.8 mmol/L (ref 3.5–5.1)
Sodium: 141 mmol/L (ref 135–145)
Total Bilirubin: 0.3 mg/dL (ref 0.0–1.2)
Total Protein: 7.2 g/dL (ref 6.5–8.1)

## 2023-06-13 LAB — CBC WITH DIFFERENTIAL/PLATELET
Abs Immature Granulocytes: 0.01 10*3/uL (ref 0.00–0.07)
Basophils Absolute: 0 10*3/uL (ref 0.0–0.1)
Basophils Relative: 0 %
Eosinophils Absolute: 0.1 10*3/uL (ref 0.0–0.5)
Eosinophils Relative: 2 %
HCT: 35.8 % — ABNORMAL LOW (ref 36.0–46.0)
Hemoglobin: 10.3 g/dL — ABNORMAL LOW (ref 12.0–15.0)
Immature Granulocytes: 0 %
Lymphocytes Relative: 29 %
Lymphs Abs: 1.9 10*3/uL (ref 0.7–4.0)
MCH: 24.9 pg — ABNORMAL LOW (ref 26.0–34.0)
MCHC: 28.8 g/dL — ABNORMAL LOW (ref 30.0–36.0)
MCV: 86.7 fL (ref 80.0–100.0)
Monocytes Absolute: 0.6 10*3/uL (ref 0.1–1.0)
Monocytes Relative: 8 %
Neutro Abs: 4 10*3/uL (ref 1.7–7.7)
Neutrophils Relative %: 61 %
Platelets: 245 10*3/uL (ref 150–400)
RBC: 4.13 MIL/uL (ref 3.87–5.11)
RDW: 17.2 % — ABNORMAL HIGH (ref 11.5–15.5)
WBC: 6.7 10*3/uL (ref 4.0–10.5)
nRBC: 0 % (ref 0.0–0.2)

## 2023-06-13 LAB — I-STAT CG4 LACTIC ACID, ED
Lactic Acid, Venous: 1.7 mmol/L (ref 0.5–1.9)
Lactic Acid, Venous: 1.4 mmol/L (ref 0.5–1.9)

## 2023-06-13 LAB — PROTIME-INR
INR: 1.1 (ref 0.8–1.2)
Prothrombin Time: 13.9 s (ref 11.4–15.2)

## 2023-06-13 LAB — HCG, SERUM, QUALITATIVE: Preg, Serum: NEGATIVE

## 2023-06-13 MED ORDER — HEPARIN SODIUM (PORCINE) 5000 UNIT/ML IJ SOLN
5000.0000 [IU] | Freq: Three times a day (TID) | INTRAMUSCULAR | Status: DC
  Administered 2023-06-13 – 2023-06-18 (×13): 5000 [IU] via SUBCUTANEOUS
  Filled 2023-06-13 (×14): qty 1

## 2023-06-13 MED ORDER — POLYETHYLENE GLYCOL 3350 17 G PO PACK: 17.0000 g | PACK | Freq: Every day | ORAL | Status: DC | PRN

## 2023-06-13 MED ORDER — ACETAMINOPHEN 325 MG PO TABS
650.0000 mg | ORAL_TABLET | Freq: Four times a day (QID) | ORAL | Status: DC | PRN
Start: 2023-06-13 — End: 2023-06-15

## 2023-06-13 MED ORDER — LACTATED RINGERS IV SOLN: INTRAVENOUS | Status: AC

## 2023-06-13 MED ORDER — MORPHINE SULFATE (PF) 4 MG/ML IV SOLN
4.0000 mg | Freq: Once | INTRAVENOUS | Status: AC
  Administered 2023-06-13: 4 mg via INTRAVENOUS
  Filled 2023-06-13: qty 1

## 2023-06-13 MED ORDER — SODIUM CHLORIDE 0.9 % IV BOLUS
1000.0000 mL | Freq: Once | INTRAVENOUS | Status: AC
  Administered 2023-06-13: 1000 mL via INTRAVENOUS

## 2023-06-13 MED ORDER — OXYCODONE HCL 5 MG PO TABS: 5.0000 mg | ORAL_TABLET | Freq: Four times a day (QID) | ORAL | Status: DC | PRN

## 2023-06-13 MED ORDER — SODIUM CHLORIDE 0.9 % IV SOLN
2.0000 g | Freq: Three times a day (TID) | INTRAVENOUS | Status: DC
  Administered 2023-06-13 – 2023-06-18 (×14): 2 g via INTRAVENOUS
  Filled 2023-06-13 (×14): qty 12.5

## 2023-06-13 MED ORDER — PROCHLORPERAZINE EDISYLATE 10 MG/2ML IJ SOLN: 5.0000 mg | Freq: Four times a day (QID) | INTRAMUSCULAR | Status: DC | PRN

## 2023-06-13 MED ORDER — HYDROMORPHONE HCL 1 MG/ML IJ SOLN
0.5000 mg | INTRAMUSCULAR | Status: DC | PRN
  Administered 2023-06-13 – 2023-06-14 (×2): 0.5 mg via INTRAVENOUS
  Filled 2023-06-13 (×2): qty 0.5

## 2023-06-13 MED ORDER — MELATONIN 5 MG PO TABS
5.0000 mg | ORAL_TABLET | Freq: Every evening | ORAL | Status: DC | PRN
  Administered 2023-06-15 – 2023-06-16 (×2): 5 mg via ORAL
  Filled 2023-06-13 (×3): qty 1

## 2023-06-13 MED ORDER — LACTATED RINGERS IV SOLN: INTRAVENOUS | Status: DC

## 2023-06-13 NOTE — Progress Notes (Signed)
 Pharmacy Antibiotic Note  [name] is a 53 y.o. female for which pharmacy has been consulted for cefepime dosing for UTI and wound infection .  Estimated Creatinine Clearance: 95.7 mL/min (by C-G formula based on SCr of 0.56 mg/dL).  Plan: Cefepime 2g q8hr  Monitor WBC, fever, renal function, cultures De-escalate when able  Height: 5\' 2"  (157.5 cm) Weight: 113.4 kg (250 lb) IBW/kg (Calculated) : 50.1  Temp (24hrs), Avg:97.9 F (36.6 C), Min:97 F (36.1 C), Max:98.6 F (37 C)  Recent Labs  Lab 06/13/23 1300 06/13/23 1350 06/13/23 1544  WBC 6.7  --   --   CREATININE 0.56  --   --   LATICACIDVEN  --  1.7 1.4    Estimated Creatinine Clearance: 95.7 mL/min (by C-G formula based on SCr of 0.56 mg/dL).    Allergies  Allergen Reactions   Silver    Tizanidine Diarrhea   Tomato    Microbiology results: Pending  Thank you for allowing pharmacy to be a part of this patient's care.  Delmar Landau, PharmD, BCPS 06/13/2023 8:13 PM ED Clinical Pharmacist -  [phone]

## 2023-06-13 NOTE — ED Notes (Signed)
 The pt keeps moaning but does not respond when asked if she's hurting  she sounda like she is saying mom  warm blanket given  replaced the pulse ox wire

## 2023-06-13 NOTE — ED Notes (Signed)
 The pt only answers to her name  she just went to xray

## 2023-06-13 NOTE — ED Notes (Signed)
 The pt keeps crying out she does not answer questions sometimes yes or no

## 2023-06-13 NOTE — H&P (Addendum)
 History and Physical  [name]:[id number] DOB: 04/28/1970 DOA: 06/13/2023  Referring physician: Denna Haggard  PCP: [facility]  Outpatient Specialists: Orthopedic surgery. Patient coming from: Home, lives with her mother and father.  Chief Complaint: Altered mental status.  HPI: [name] is a 53 y.o. female with medical history significant for MS, bedbound, osteomyelitis of the sacrum, osteomyelitis of vertebra, sacral, and sacrococcygeal region, pressure ulcer right buttock and left buttock, both stage IV, followed by wound care, history of recurrent UTIs, Pseudomonas aeruginosa UTI, Citrobacter Freundii UTI, E. coli UTI, who presents to the ER from home via EMS with altered mental status.  Reportedly per her parents, at baseline she is alert and oriented x 4 and this morning she was noted to be altered, therefore they called EMS.  In the ER, alert and confused.  Vital signs are stable.  Lactic acid within normal range, 1.7 and 1.4.  On exam she is noted to have pressure wounds.  Left ankle x-ray shows diffuse soft tissue edema throughout the visualized left lower leg, diffuse osteopenia, no acute fracture, stable ORIF of a bimalleolar fracture.  No evidence of orthopedic hardware failure or loosening.  No bony destruction or periosteal reaction to suggest osteomyelitis.  UA is positive for pyuria.  Due to concern for UTI and possible pressure wounds infection, the patient was started on IV antibiotics, IV cefepime.  MRSA screening is pending.  EDP consulted [facility] on-call who will see in consultation.  TRH, hospitalist service, was asked to admit.  ED Course: Temperature 98.6.  BP 130/75, pulse 89, respiration rate 12, O2 saturation 100% on room air.  Lab studies notable for WBC 6.7, hemoglobin 10.3, platelet count 245.  Albumin 2.5.  Review of Systems: Review of systems as noted in the HPI. All other systems reviewed and are negative.   Past Medical  History:  Diagnosis Date   Arthritis    MS (multiple sclerosis) (HCC)    Past Surgical History:  Procedure Laterality Date   NO PAST SURGERIES     ORIF ANKLE FRACTURE Left 03/30/2013   Procedure: OPEN REDUCTION INTERNAL FIXATION (ORIF) LEFT ANKLE FRACTURE;  Surgeon: Toni Arthurs, MD;  Location: [facility] OR;  Service: Orthopedics;  Laterality: Left;    Social History:  reports that she quit smoking about 14 years ago. She started smoking about 34 years ago. She has a 40 pack-year smoking history. She quit smokeless tobacco use about 4 years ago. She reports that she does not drink alcohol and does not use drugs.   Allergies  Allergen Reactions   Silver    Tizanidine Diarrhea   Tomato     Family history: None reported.  Prior to Admission medications   Medication Sig Start Date End Date Taking? Authorizing Provider  acetaminophen (TYLENOL) 500 MG tablet Take 1,000 mg by mouth every 6 (six) hours as needed.    [provider]  baclofen (LIORESAL) 20 MG tablet Take 20 mg by mouth 3 (three) times daily. 11/05/18   [provider]  busPIRone (BUSPAR) 5 MG tablet Take by mouth. 05/12/21   [provider]  carvedilol (COREG) 3.125 MG tablet Take 3.125 mg by mouth 2 (two) times daily with a meal. Patient not taking: Reported on 08/03/2021    [provider]  diclofenac Sodium (VOLTAREN) 1 % GEL Apply 4 g topically 4 (four) times daily. Apply to joint or muscle pains Patient not taking: Reported on 08/03/2021 12/25/20   Princess Bruins, DO  doxycycline (  MONODOX) 100 MG capsule Take 1 capsule (100 mg total) by mouth 2 (two) times daily. 08/03/21   ComerBelia Heman, MD  feeding supplement, ENSURE ENLIVE, (ENSURE ENLIVE) LIQD Take 237 mLs by mouth 2 (two) times daily between meals. Patient not taking: Reported on 11/12/2020 02/01/19   Lorin Glass, MD  gabapentin (NEURONTIN) 300 MG capsule Take 300-600 mg by mouth See admin instructions. 300 mg  morning, noon and dinner and  2 capsules (600mg ) at bedtime. 12/14/18   [provider]  ibuprofen (ADVIL) 200 MG tablet Take 400 mg by mouth every 6 (six) hours as needed for headache or moderate pain. Patient not taking: Reported on 08/03/2021    [provider]  levofloxacin (LEVAQUIN) 500 MG tablet Take 1 tablet (500 mg total) by mouth daily. 08/03/21   Gardiner Barefoot, MD  meloxicam (MOBIC) 7.5 MG tablet Take 7.5 mg by mouth daily. 11/05/18   [provider]  mineral oil-hydrophilic petrolatum (AQUAPHOR) ointment Apply topically as needed.    [provider]  Multiple Vitamin (MULTIVITAMIN) capsule Take 1 capsule by mouth daily. Patient not taking: Reported on 08/03/2021    [provider]  Multiple Vitamins-Minerals (ADULT GUMMY PO) Take by mouth daily.    [provider]  protein supplement shake (PREMIER PROTEIN) LIQD Take 2 oz by mouth daily. chocolate Patient not taking: Reported on 08/03/2021    [provider]  senna-docusate (SENOKOT-S) 8.6-50 MG tablet Take 2 tablets by mouth 2 (two) times daily as needed for mild constipation. Patient not taking: Reported on 02/19/2021 11/13/20   Masters, Florentina Addison, DO  traMADol (ULTRAM) 50 MG tablet Take 1 tablet (50 mg total) by mouth every 12 (twelve) hours as needed for moderate pain. Patient taking differently: Take 50 mg by mouth every 12 (twelve) hours as needed for moderate pain. 02/22/21   Ghimire, Werner Lean, MD  vitamin C (ASCORBIC ACID) 500 MG tablet Take 500 mg by mouth daily. Patient not taking: Reported on 08/03/2021    [provider]  Zinc Oxide (TRIPLE PASTE) 12.8 % ointment Apply topically 2 (two) times daily. Apply AROUND sacral wound area. Then follow Dakin's order. Patient taking differently: Apply topically as needed for irritation. 12/25/20   Princess Bruins, DO    Physical Exam: BP 130/75 (BP Location: Right Arm)   Pulse 89   Temp 98.6 F (37 C) (Oral)   Resp 12   Ht 5\' 2"  (1.575 m)   Wt  113.4 kg   SpO2 100%   BMI 45.73 kg/m   General: 53 y.o. year-old female well developed well nourished in no acute distress.  Alert and confused. Cardiovascular: Regular rate and rhythm with no rubs or gallops.  No thyromegaly or JVD noted.  Bilateral lower extremity edema.   Respiratory: Clear to auscultation with no wheezes or rales. Good inspiratory effort. Abdomen: Soft nontender nondistended with normal bowel sounds x4 quadrants. Muskuloskeletal: No cyanosis or clubbing noted bilaterally Neuro: CN II-XII intact, strength, sensation, reflexes Skin: Pressure wounds involving sacrum, buttocks, left ankle Psychiatry: Judgement and insight appear altered. Mood is appropriate for condition and setting          Labs on Admission:  Basic Metabolic Panel: Recent Labs  Lab 06/13/23 1300  NA 141  K 3.8  CL 107  CO2 23  GLUCOSE 109*  BUN 19  CREATININE 0.56  CALCIUM 8.7*   Liver Function Tests: Recent Labs  Lab 06/13/23 1300  AST 21  ALT 12  ALKPHOS 82  BILITOT 0.3  PROT 7.2  ALBUMIN 2.5*   No results for input(s): "LIPASE", "AMYLASE" in the last 168 hours. No results for input(s): "AMMONIA" in the last 168 hours. CBC: Recent Labs  Lab 06/13/23 1300  WBC 6.7  NEUTROABS 4.0  HGB 10.3*  HCT 35.8*  MCV 86.7  PLT 245   Cardiac Enzymes: No results for input(s): "CKTOTAL", "CKMB", "CKMBINDEX", "TROPONINI" in the last 168 hours.  BNP (last 3 results) No results for input(s): "BNP" in the last 8760 hours.  ProBNP (last 3 results) No results for input(s): "PROBNP" in the last 8760 hours.  CBG: No results for input(s): "GLUCAP" in the last 168 hours.  Radiological Exams on Admission: CT Head Wo Contrast Result Date: 06/13/2023 CLINICAL DATA:  Altered mental status. EXAM: CT HEAD WITHOUT CONTRAST TECHNIQUE: Contiguous axial images were obtained from the base of the skull through the vertex without intravenous contrast. RADIATION DOSE REDUCTION: This exam was performed  according to the departmental dose-optimization program which includes automated exposure control, adjustment of the mA and/or kV according to patient size and/or use of iterative reconstruction technique. COMPARISON:  None Available. FINDINGS: Brain: The ventricles and sulci are appropriate size for the patient's age. The gray-white matter discrimination is preserved. There is no acute intracranial hemorrhage. No mass effect or midline shift. No extra-axial fluid collection. Vascular: No hyperdense vessel or unexpected calcification. Skull: Normal. Negative for fracture or focal lesion. Sinuses/Orbits: No acute finding. Other: None IMPRESSION: No acute intracranial pathology. Electronically Signed   By: Elgie Collard M.D.   On: 06/13/2023 16:15   DG Ankle Complete Left Result Date: 06/13/2023 CLINICAL DATA:  Questionable sepsis EXAM: LEFT ANKLE COMPLETE - 3+ VIEW COMPARISON:  03/30/2013 FINDINGS: Frontal, oblique, and lateral views of the left ankle are obtained. Stable position of the orthopedic hardware placed previously, with lateral plate and screw fixation across the distal fibula and 2 cannulated screws traversing the medial malleolus. Bones are diffusely osteopenic. No acute fracture, subluxation, or dislocation. Mild osteoarthritis of the ankle and hindfoot. There is diffuse subcutaneous edema throughout the left lower leg, ankle, and visualized portions of the left foot. No bony destruction or periosteal reaction to suggest osteomyelitis. IMPRESSION: 1. Diffuse soft tissue edema throughout the visualized left lower leg. 2. Diffuse osteopenia.  No acute fracture. 3. Stable ORIF of a bimalleolar fracture. No evidence of orthopedic hardware failure or loosening. Electronically Signed   By: Sharlet Salina M.D.   On: 06/13/2023 14:51   DG Chest Port 1 View Result Date: 06/13/2023 CLINICAL DATA:  Questionable sepsis - evaluate for abnormality. EXAM: PORTABLE CHEST 1 VIEW COMPARISON:  02/18/2021. FINDINGS:  Low lung volume. Bilateral lung fields are clear. Bilateral costophrenic angles are clear. Normal cardio-mediastinal silhouette. No acute osseous abnormalities. The soft tissues are within normal limits. IMPRESSION: No active disease. Electronically Signed   By: Jules Schick M.D.   On: 06/13/2023 14:49    EKG: I independently viewed the EKG done and my findings are as followed: Sinus arrhythmia rate of 86.  Occasional PVCs.  QTc 441.  Assessment/Plan Present on Admission:  AMS (altered mental status)  Principal Problem:   AMS (altered mental status)  Acute metabolic encephalopathy, suspect multifactorial Presumptive UTI and possible pressure wounds infection, POA. Treat underlying conditions Reorient as needed Monitor fever curve and WBCs  Possible multiple pressure wounds infection, POA. Follow peripheral blood cultures x 2 Orthopedic surgery, [facility] consulted by EDP to assess left ankle pressure wound. Status post ORIF of a bimalleolar  fracture of left ankle. As needed analgesics and IV antibiotics until infection is ruled out Follow MRSA screening test  Presumptive UTI, POA The patient is unable to provide this history Unclear if she has been symptomatic UA positive for pyuria Follow urine culture Continue IV antibiotics, cefepime, as initiated in the ER History of Pseudomonas aeruginosa UTI and Citrobacter Freundii UTI, both sensitive to cefepime.  Multiple sclerosis Bedbound status Frequent turns Resume home regimen once home medications are reconciled Local wound care  Multiple pressure wounds The patient receives wound care outpatient Wound care specialist consulted Continue local wound care with wound care specialist's guidance As needed analgesics  Anemia of chronic disease in the setting of chronic pressure wounds Hemoglobin is stable at 10.3, monitor  Hypoalbuminemia Albumin 2.5 Improve nutritional status due to chronic wounds  Obesity BMI  45 Recommend weight loss outpatient with regular physical activity and healthy dieting.   Time: 75 minutes.   DVT prophylaxis: Subcu heparin 3 times daily  Code Status: Full code by default, no family members at bedside.  Full CODE STATUS confirmed by the patient's mother Margene Cherian via phone  Family Communication: None at bedside.  Updated the patient's mother Emilie Carp via phone.  Disposition Plan: Admitted to telemetry surgical unit.  Consults called: Orthopedic surgery consulted by EDP.  Admission status: Inpatient status.   Status is: Inpatient The patient requires at least 2 midnights for further evaluation and treatment of present condition.   Darlin Drop MD [facility] Pager [phone]  If 7PM-7AM, please contact night-coverage [email or web address] Password [facility]  06/13/2023, 7:47 PM

## 2023-06-13 NOTE — ED Provider Notes (Signed)
  Physical Exam  BP 130/75 (BP Location: Right Arm)   Pulse 89   Temp 98.6 F (37 C) (Oral)   Resp 12   Ht 5\' 2"  (1.575 m)   Wt 113.4 kg   SpO2 100%   BMI 45.73 kg/m   Physical Exam  Procedures  Procedures  ED Course / MDM   Clinical Course as of 06/13/23 2022  Fri Jun 13, 2023  1516 Bedbound w/ hx MS. R gluteal ulcer (appears chronic) w/ sacral osteo in past.  AMS A&O x4 in past. Typically at home.  New open wound on L ankle (hardware through wound).  Gets UTIs - concern for sepsis [HG]    Clinical Course User Index [HG] Renella Cunas, MD   Medical Decision Making Amount and/or Complexity of Data Reviewed Labs: ordered. Radiology: ordered.  Risk Prescription drug management. Decision regarding hospitalization.   At the time of handoff, I was awaiting urinalysis which was notable for rare bacteria and leukocyte esterase.  Initiated patient on cefepime as she has been sensitive to this in the past on prior urine cultures which were reviewed by myself in the chart.  Left ankle hardware is exposed over the left lateral malleolus.  No prior mention of this wound in chart, however not currently bleeding or draining.  Initially, discussed patient with on-call orthopedics, however patient's left bimalleolar fracture 11 years ago was handled by [facility], Dr. Victorino Dike.  I discussed the patient with [facility] on-call orthopedic surgeon, Dr. Shon Baton, who requested patient be placed in a posterior splint with dry dressing.  This was communicated with the admitting team.  Patient will be admitted to hospitalist medicine for altered mental status, UTI treatment, and orthopedic management of exposed bimalleolar hardware.  Patient required no further emergent intervention prior to admission.  Renella Cunas, PGY-2 Emergency Medicine   Renella Cunas, MD 06/13/23 2022    Cathren Laine, MD 06/14/23 [phone]

## 2023-06-13 NOTE — ED Notes (Signed)
 Unable to obtain second set of blood cultures, lab notified for another attempt.

## 2023-06-13 NOTE — ED Provider Notes (Signed)
 [place] EMERGENCY DEPARTMENT AT [facility] Provider Note   CSN: [id number] Arrival date & time: 06/13/23  1240     History  Chief Complaint  Patient presents with   pssible sepsis   Altered Mental Status    [name] is a 53 y.o. female with past medical history significant for multiple sclerosis, severe decubitus ulcer on right buttock, with associated osteomyelitis, bedbound at baseline, history of UTIs, hypertension who presents with altered mental status, possible sepsis.  She is normally ANO x 4 but is currently only responsive to name, confused about time, place, situation.  She does appear to have a new wound on her left ankle which is quite tender to her.  She rates significant pain, is moaning on arrival, pointing to left extremity.  50 mcg of fentanyl and 280 mL of lactated Ringer's given prior to arrival.  No fever, no hypotension, normal oxygen saturation on room air.  HPI     Home Medications Prior to Admission medications   Medication Sig Start Date End Date Taking? Authorizing Provider  acetaminophen (TYLENOL) 500 MG tablet Take 1,000 mg by mouth every 6 (six) hours as needed.    [provider]  baclofen (LIORESAL) 20 MG tablet Take 20 mg by mouth 3 (three) times daily. 11/05/18   [provider]  busPIRone (BUSPAR) 5 MG tablet Take by mouth. 05/12/21   [provider]  carvedilol (COREG) 3.125 MG tablet Take 3.125 mg by mouth 2 (two) times daily with a meal. Patient not taking: Reported on 08/03/2021    [provider]  diclofenac Sodium (VOLTAREN) 1 % GEL Apply 4 g topically 4 (four) times daily. Apply to joint or muscle pains Patient not taking: Reported on 08/03/2021 12/25/20   Princess Bruins, DO  doxycycline (MONODOX) 100 MG capsule Take 1 capsule (100 mg total) by mouth 2 (two) times daily. 08/03/21   ComerBelia Heman, MD  feeding supplement, ENSURE ENLIVE, (ENSURE ENLIVE) LIQD Take 237 mLs by mouth 2 (two) times  daily between meals. Patient not taking: Reported on 11/12/2020 02/01/19   Lorin Glass, MD  gabapentin (NEURONTIN) 300 MG capsule Take 300-600 mg by mouth See admin instructions. 300 mg  morning, noon and dinner and 2 capsules (600mg ) at bedtime. 12/14/18   [provider]  ibuprofen (ADVIL) 200 MG tablet Take 400 mg by mouth every 6 (six) hours as needed for headache or moderate pain. Patient not taking: Reported on 08/03/2021    [provider]  levofloxacin (LEVAQUIN) 500 MG tablet Take 1 tablet (500 mg total) by mouth daily. 08/03/21   Gardiner Barefoot, MD  meloxicam (MOBIC) 7.5 MG tablet Take 7.5 mg by mouth daily. 11/05/18   [provider]  mineral oil-hydrophilic petrolatum (AQUAPHOR) ointment Apply topically as needed.    [provider]  Multiple Vitamin (MULTIVITAMIN) capsule Take 1 capsule by mouth daily. Patient not taking: Reported on 08/03/2021    [provider]  Multiple Vitamins-Minerals (ADULT GUMMY PO) Take by mouth daily.    [provider]  protein supplement shake (PREMIER PROTEIN) LIQD Take 2 oz by mouth daily. chocolate Patient not taking: Reported on 08/03/2021    [provider]  senna-docusate (SENOKOT-S) 8.6-50 MG tablet Take 2 tablets by mouth 2 (two) times daily as needed for mild constipation. Patient not taking: Reported on 02/19/2021 11/13/20   Masters, Florentina Addison, DO  traMADol (ULTRAM) 50 MG tablet Take 1 tablet (50 mg total) by mouth every 12 (  twelve) hours as needed for moderate pain. Patient taking differently: Take 50 mg by mouth every 12 (twelve) hours as needed for moderate pain. 02/22/21   Ghimire, Werner Lean, MD  vitamin C (ASCORBIC ACID) 500 MG tablet Take 500 mg by mouth daily. Patient not taking: Reported on 08/03/2021    [provider]  Zinc Oxide (TRIPLE PASTE) 12.8 % ointment Apply topically 2 (two) times daily. Apply AROUND sacral wound area. Then follow Dakin's order. Patient taking  differently: Apply topically as needed for irritation. 12/25/20   Princess Bruins, DO      Allergies    Silver, Tizanidine, and Tomato    Review of Systems   Review of Systems  All other systems reviewed and are negative.   Physical Exam Updated Vital Signs BP 132/72   Pulse 86   Temp (!) 97 F (36.1 C) (Rectal)   Resp 13   Ht 5\' 2"  (1.575 m)   Wt 113.4 kg   SpO2 100%   BMI 45.73 kg/m  Physical Exam Vitals and nursing note reviewed.  Constitutional:      General: She is in acute distress.     Appearance: She is ill-appearing.  HENT:     Head: Normocephalic and atraumatic.  Eyes:     General:        Right eye: No discharge.        Left eye: No discharge.  Cardiovascular:     Rate and Rhythm: Normal rate and regular rhythm.     Heart sounds: No murmur heard.    No friction rub. No gallop.  Pulmonary:     Effort: Pulmonary effort is normal.     Breath sounds: Normal breath sounds.  Abdominal:     General: Bowel sounds are normal.     Palpations: Abdomen is soft.  Skin:    General: Skin is warm and dry.     Capillary Refill: Capillary refill takes less than 2 seconds.     Comments: Significant stage 4+ sacral wound on right buttocks, no significant surrounding skin cellulitis.  He also has an open ulcerated wound on the left ankle with metal hardware visible through open ulcer.  Significant tenderness to palpation surrounding this wound, and some redness of surrounding skin.  Neurological:     Mental Status: She is alert and oriented to person, place, and time.  Psychiatric:        Mood and Affect: Mood normal.        Behavior: Behavior normal.     ED Results / Procedures / Treatments   Labs (all labs ordered are listed, but only abnormal results are displayed) Labs Reviewed  COMPREHENSIVE METABOLIC PANEL WITH GFR - Abnormal; Notable for the following components:      Result Value   Glucose, Bld 109 (*)    Calcium 8.7 (*)    Albumin 2.5 (*)    All other  components within normal limits  CBC WITH DIFFERENTIAL/PLATELET - Abnormal; Notable for the following components:   Hemoglobin 10.3 (*)    HCT 35.8 (*)    MCH 24.9 (*)    MCHC 28.8 (*)    RDW 17.2 (*)    All other components within normal limits  CULTURE, BLOOD (ROUTINE X 2)  CULTURE, BLOOD (ROUTINE X 2)  PROTIME-INR  HCG, SERUM, QUALITATIVE  URINALYSIS, W/ REFLEX TO CULTURE (INFECTION SUSPECTED)  I-STAT CG4 LACTIC ACID, ED  I-STAT CG4 LACTIC ACID, ED    EKG EKG Interpretation Date/Time:  Friday  June 13 2023 13:26:13 EDT Ventricular Rate:  86 PR Interval:  147 QRS Duration:  70 QT Interval:  368 QTC Calculation: 441 R Axis:   -2  Text Interpretation: Sinus arrhythmia Ventricular premature complex Minimal ST depression, lateral leads no significant change since 2022 Confirmed by Pricilla Loveless [phone]) on 06/13/2023 1:47:25 PM  Radiology DG Ankle Complete Left Result Date: 06/13/2023 CLINICAL DATA:  Questionable sepsis EXAM: LEFT ANKLE COMPLETE - 3+ VIEW COMPARISON:  03/30/2013 FINDINGS: Frontal, oblique, and lateral views of the left ankle are obtained. Stable position of the orthopedic hardware placed previously, with lateral plate and screw fixation across the distal fibula and 2 cannulated screws traversing the medial malleolus. Bones are diffusely osteopenic. No acute fracture, subluxation, or dislocation. Mild osteoarthritis of the ankle and hindfoot. There is diffuse subcutaneous edema throughout the left lower leg, ankle, and visualized portions of the left foot. No bony destruction or periosteal reaction to suggest osteomyelitis. IMPRESSION: 1. Diffuse soft tissue edema throughout the visualized left lower leg. 2. Diffuse osteopenia.  No acute fracture. 3. Stable ORIF of a bimalleolar fracture. No evidence of orthopedic hardware failure or loosening. Electronically Signed   By: Sharlet Salina M.D.   On: 06/13/2023 14:51   DG Chest Port 1 View Result Date: 06/13/2023 CLINICAL  DATA:  Questionable sepsis - evaluate for abnormality. EXAM: PORTABLE CHEST 1 VIEW COMPARISON:  02/18/2021. FINDINGS: Low lung volume. Bilateral lung fields are clear. Bilateral costophrenic angles are clear. Normal cardio-mediastinal silhouette. No acute osseous abnormalities. The soft tissues are within normal limits. IMPRESSION: No active disease. Electronically Signed   By: Jules Schick M.D.   On: 06/13/2023 14:49    Procedures Procedures    Medications Ordered in ED Medications  morphine (PF) 4 MG/ML injection 4 mg (4 mg Intravenous Given 06/13/23 1318)  sodium chloride 0.9 % bolus 1,000 mL (0 mLs Intravenous Stopped 06/13/23 1517)    ED Course/ Medical Decision Making/ A&P Clinical Course as of 06/13/23 1533  Fri Jun 13, 2023  1516 Bedbound w/ hx MS. R gluteal ulcer (appears chronic) w/ sacral osteo in past.  AMS A&O x4 in past. Typically at home.  New open wound on L ankle (hardware through wound).  Gets UTIs - concern for sepsis [HG]    Clinical Course User Index [HG] Renella Cunas, MD                                 Medical Decision Making  This patient is a 53 y.o. female who presents to the ED for concern of possible sepsis, altered mental status, this involves an extensive number of treatment options, and is a complaint that carries with it a high risk of complications and morbidity. The emergent differential diagnosis prior to evaluation includes, but is not limited to,  CVA, seizure, hypotension, sepsis, hypoglycemia, hypoxic encephalopathy, metabolic encephalopathy, polypharmacy, substance abuse, developing dementia or alzheimers, meningitis, encephalitis, hypertensive emergency, other systemic infection, acute alcohol intoxication, acute alcohol or other drug withdrawal or psychiatric manifestation vs other, infection related to her chronic decubitus ulcers . This is not an exhaustive differential.   Past Medical History / Co-morbidities / Social History: multiple  sclerosis, severe decubitus ulcer on right buttock, with associated osteomyelitis, bedbound at baseline, history of UTIs, hypertension  Additional history: Chart reviewed. Pertinent results include: Reviewed lab work, imaging from previous emergency room visits  Physical Exam: Physical exam performed. The pertinent findings include: Significant  stage 4+ sacral wound on right buttocks, no significant surrounding skin cellulitis.  He also has an open ulcerated wound on the left ankle with metal hardware visible through open ulcer.  Significant tenderness to palpation surrounding this wound, and some redness of surrounding skin.   Lab Tests: I ordered, and personally interpreted labs.  The pertinent results include: CBC with mild anemia, hemoglobin 10.3 slightly lower than baseline 2 years ago.  CMP overall unremarkable, likely chronically low albumin.  PT/INR unremarkable, lactic acid normal, negative pregnancy test.  UA is pending at this time.   Imaging Studies: I ordered imaging studies including plain film radiograph of the left ankle, and chest. I independently visualized and interpreted imaging which showed  1. Diffuse soft tissue edema throughout the visualized left lower  leg.  2. Diffuse osteopenia.  No acute fracture.  3. Stable ORIF of a bimalleolar fracture. No evidence of orthopedic  hardware failure or loosening.   No evidence of acute intrathoracic abnormality.. I agree with the radiologist interpretation.   Cardiac Monitoring:  The patient was maintained on a cardiac monitor.  My attending physician Dr. Criss Alvine viewed and interpreted the cardiac monitored which showed an underlying rhythm of: Normal sinus rhythm, some ST depressions noted but no significant change from baseline 2022. I agree with this interpretation.   Medications: I ordered medication including fluid bolus, morphine for possible early sepsis concern on arrival, pain  3:33 PM Care of [name]  transferred to Renella Cunas, MD and Dr. Denton Lank at the end of my shift as the patient will require reassessment once labs/imaging have resulted. Patient presentation, ED course, and plan of care discussed with review of all pertinent labs and imaging. Please see his/her note for further details regarding further ED course and disposition. Plan at time of handoff is repeat evaluation after urinalysis, possible consult to orthopedics related to open wound with hardware visible, and follow-up on head CT for further evaluation of altered mental status. This may be altered or completely changed at the discretion of the oncoming team pending results of further workup.   Final Clinical Impression(s) / ED Diagnoses Final diagnoses:  None    Rx / DC Orders ED Discharge Orders     None         [place] 06/13/23 1533    Pricilla Loveless, MD 06/14/23 [phone]

## 2023-06-13 NOTE — ED Triage Notes (Signed)
 Pt bib gcems from home ue to possible sepsis. EMS was called out due to patient AMS. Pt is normally A&OX4@ baseline. EMS reported bedsores on lower extremeties.      97% ra  33 104HR    HX : MS , UTIS, HTN   *PT has been admitted before for sepsis approximately 1 mo. Per ems.   Ems Gven fentanyl 280 lrLR

## 2023-06-13 NOTE — Progress Notes (Addendum)
 Wound bed clean inside pink, red and painful. Measurement 6cm by 6cm by 3cm depth. Dressed with wet to dry packed and foam dressing. Skin is dry and flaky, arms has some small abrasions and a small abrasion noted to the l knee. Foam dressings applied to sacrum, heels bilaterally and RN will reposition pt q2 during shift.     Pt HR noted 99-114 at rest. BP 148/83. MD made aware of HR.

## 2023-06-14 DIAGNOSIS — G35 Multiple sclerosis: Secondary | ICD-10-CM | POA: Diagnosis not present

## 2023-06-14 DIAGNOSIS — N3000 Acute cystitis without hematuria: Secondary | ICD-10-CM

## 2023-06-14 DIAGNOSIS — R410 Disorientation, unspecified: Secondary | ICD-10-CM

## 2023-06-14 DIAGNOSIS — L8990 Pressure ulcer of unspecified site, unspecified stage: Secondary | ICD-10-CM | POA: Diagnosis not present

## 2023-06-14 LAB — BLOOD CULTURE ID PANEL (REFLEXED) - BCID2

## 2023-06-14 LAB — BASIC METABOLIC PANEL WITH GFR
Anion gap: 12 (ref 5–15)
BUN: 14 mg/dL (ref 6–20)
CO2: 23 mmol/L (ref 22–32)
Calcium: 9.2 mg/dL (ref 8.9–10.3)
Chloride: 106 mmol/L (ref 98–111)
Creatinine, Ser: 0.48 mg/dL (ref 0.44–1.00)
GFR, Estimated: >60 mL/min (ref >60)
Glucose, Bld: 100 mg/dL — ABNORMAL HIGH (ref 70–99)
Potassium: 3.9 mmol/L (ref 3.5–5.1)
Sodium: 141 mmol/L (ref 135–145)

## 2023-06-14 LAB — CBC
HCT: 37 % (ref 36.0–46.0)
Hemoglobin: 10.9 g/dL — ABNORMAL LOW (ref 12.0–15.0)
MCH: 24.9 pg — ABNORMAL LOW (ref 26.0–34.0)
MCHC: 29.5 g/dL — ABNORMAL LOW (ref 30.0–36.0)
MCV: 84.5 fL (ref 80.0–100.0)
Platelets: 262 10*3/uL (ref 150–400)
RBC: 4.38 MIL/uL (ref 3.87–5.11)
RDW: 17.2 % — ABNORMAL HIGH (ref 11.5–15.5)
WBC: 6.2 10*3/uL (ref 4.0–10.5)
nRBC: 0 % (ref 0.0–0.2)

## 2023-06-14 LAB — MAGNESIUM: Magnesium: 1.6 mg/dL — ABNORMAL LOW (ref 1.7–2.4)

## 2023-06-14 LAB — MRSA NEXT GEN BY PCR, NASAL: MRSA by PCR Next Gen: NOT DETECTED

## 2023-06-14 LAB — PHOSPHORUS: Phosphorus: 2.6 mg/dL (ref 2.5–4.6)

## 2023-06-14 LAB — HIV ANTIBODY (ROUTINE TESTING W REFLEX): HIV Screen 4th Generation wRfx: NONREACTIVE

## 2023-06-14 MED ORDER — BUSPIRONE HCL 5 MG PO TABS
5.0000 mg | ORAL_TABLET | Freq: Every day | ORAL | Status: DC
  Administered 2023-06-14 – 2023-06-18 (×4): 5 mg via ORAL
  Filled 2023-06-14 (×4): qty 1

## 2023-06-14 MED ORDER — GABAPENTIN 300 MG PO CAPS
300.0000 mg | ORAL_CAPSULE | Freq: Three times a day (TID) | ORAL | Status: DC
  Administered 2023-06-15 – 2023-06-18 (×7): 300 mg via ORAL
  Filled 2023-06-14 (×7): qty 1

## 2023-06-14 MED ORDER — GABAPENTIN 300 MG PO CAPS
600.0000 mg | ORAL_CAPSULE | Freq: Every day | ORAL | Status: DC
  Administered 2023-06-14 – 2023-06-17 (×4): 600 mg via ORAL
  Filled 2023-06-14 (×4): qty 2

## 2023-06-14 MED ORDER — CARVEDILOL 3.125 MG PO TABS
3.1250 mg | ORAL_TABLET | Freq: Two times a day (BID) | ORAL | Status: DC
  Administered 2023-06-14 – 2023-06-18 (×7): 3.125 mg via ORAL
  Filled 2023-06-14 (×7): qty 1

## 2023-06-14 MED ORDER — OXYCODONE HCL 5 MG PO TABS
2.5000 mg | ORAL_TABLET | Freq: Four times a day (QID) | ORAL | Status: DC | PRN
  Administered 2023-06-14 – 2023-06-15 (×2): 5 mg via ORAL
  Filled 2023-06-14 (×2): qty 1

## 2023-06-14 NOTE — Plan of Care (Signed)

## 2023-06-14 NOTE — Progress Notes (Addendum)
 PHARMACY - PHYSICIAN COMMUNICATION CRITICAL VALUE ALERT - BLOOD CULTURE IDENTIFICATION (BCID)  [name] is an 53 y.o. female who presented to [facility] on 06/13/2023 with a chief complaint of AMS and recurrent UTIs.  Assessment: 1 of 4 bottles is growing MRSE, likely a contaminant as patient is improving per documentation.  Afebrile, WBC WNL.  Name of physician (or Provider) Contacted: Dr. Natale Milch  Current antibiotics: cefepime for UTI  Changes to prescribed antibiotics recommended:  Patient is on recommended antibiotics - No changes needed Repeat BCx since patient has hardware  Results for orders placed or performed during the hospital encounter of 06/13/23  Blood Culture ID Panel (Reflexed) (Collected: 06/13/2023  1:00 PM)  Result Value Ref Range   Enterococcus faecalis NOT DETECTED NOT DETECTED   Enterococcus Faecium NOT DETECTED NOT DETECTED   Listeria monocytogenes NOT DETECTED NOT DETECTED   Staphylococcus species DETECTED (A) NOT DETECTED   Staphylococcus aureus (BCID) NOT DETECTED NOT DETECTED   Staphylococcus epidermidis DETECTED (A) NOT DETECTED   Staphylococcus lugdunensis NOT DETECTED NOT DETECTED   Streptococcus species NOT DETECTED NOT DETECTED   Streptococcus agalactiae NOT DETECTED NOT DETECTED   Streptococcus pneumoniae NOT DETECTED NOT DETECTED   Streptococcus pyogenes NOT DETECTED NOT DETECTED   A.calcoaceticus-baumannii NOT DETECTED NOT DETECTED   Bacteroides fragilis NOT DETECTED NOT DETECTED   Enterobacterales NOT DETECTED NOT DETECTED   Enterobacter cloacae complex NOT DETECTED NOT DETECTED   Escherichia coli NOT DETECTED NOT DETECTED   Klebsiella aerogenes NOT DETECTED NOT DETECTED   Klebsiella oxytoca NOT DETECTED NOT DETECTED   Klebsiella pneumoniae NOT DETECTED NOT DETECTED   Proteus species NOT DETECTED NOT DETECTED   Salmonella species NOT DETECTED NOT DETECTED   Serratia marcescens NOT DETECTED NOT DETECTED   Haemophilus influenzae NOT  DETECTED NOT DETECTED   Neisseria meningitidis NOT DETECTED NOT DETECTED   Pseudomonas aeruginosa NOT DETECTED NOT DETECTED   Stenotrophomonas maltophilia NOT DETECTED NOT DETECTED   Candida albicans NOT DETECTED NOT DETECTED   Candida auris NOT DETECTED NOT DETECTED   Candida glabrata NOT DETECTED NOT DETECTED   Candida krusei NOT DETECTED NOT DETECTED   Candida parapsilosis NOT DETECTED NOT DETECTED   Candida tropicalis NOT DETECTED NOT DETECTED   Cryptococcus neoformans/gattii NOT DETECTED NOT DETECTED   Methicillin resistance mecA/C DETECTED (A) NOT DETECTED    [name], PharmD, BCPS, BCCCP 06/14/2023, 1:09 PM

## 2023-06-14 NOTE — Progress Notes (Signed)
 Patient's pulse rate would elevate through-out the day. No acute distress noted. Md was made aware, nursing staff will continue to monitor for increased pain and discomfort.

## 2023-06-14 NOTE — Progress Notes (Signed)
 PHARMACY - PHYSICIAN COMMUNICATION CRITICAL VALUE ALERT - BLOOD CULTURE IDENTIFICATION (BCID)  [name] is an 53 y.o. female who presented to [facility] on 06/13/2023 with a chief complaint of AMS, possible UTI  Assessment:   1/2 blood cultures growing gram positive rods, possible contaminant  Name of physician (or Provider) Contacted:  Dr. Antionette Char  Current antibiotics:  Cefepime  Changes to prescribed antibiotics recommended:  No changes needed at this time    [name] 06/14/2023  3:54 AM

## 2023-06-14 NOTE — Progress Notes (Signed)
 PROGRESS NOTE    JEANNY RYMER  ZOX:[id number] DOB: [birth year] DOA: 06/13/2023 PCP: [facility]   Brief Narrative:  [name] is a 53 y.o. female with medical history significant for MS, bedbound, osteomyelitis of the sacrum, osteomyelitis of vertebra, sacral, and sacrococcygeal region, known stage IV pressure ulcer right buttock and left buttock followed by wound care in the outpatient setting, noted recurrent history of UTI previously with Pseudomonas Citrobacter and E. coli.  Patient presents from home with acute metabolic encephalopathy concerning for possible infection.  ED workup without overt findings, patient did not meet sepsis criteria and imaging was unremarkable for acute findings at intake.  UA without clear infection, cultures pending.  Hospitalist called for admission.  Orthopedics called in consult over left lateral malleolus pressure ulcer near prior hardware placement.  Assessment & Plan:   Principal Problem:   AMS (altered mental status)   Acute encephalopathy, metabolic versus toxic -Etiology remains unclear, patient improving with supportive care and antibiotics -Wean narcotics aggressively given polypharmacy as potential although no overt CNS depressants on home medication list other than tramadol   Rule out UTI, POA Rule out pressure wounds infection, POA. -Patient does not meet sepsis criteria  -Ortho consulted in regards to left lateral malleolus pressure wound near prior hardware, appreciate insight recommendations -Cefepime ongoing for coverage given sensitivities of prior Pseudomonas, Citrobacter -Cultures pending, MRSA swab negative   Multiple sclerosis Bedbound status at baseline, continue frequent turns Local wound care ongoing, see below   Multiple pressure wounds -Follows outpatient wound care, wound care consulted while inpatient, continue per their guidance -Wean narcotics aggressively in the setting of encephalopathy -Patient  states her home regimen including meloxicam covers her daily pain quite well   Anemia of chronic disease in the setting of chronic pressure wounds Stable at baseline   Hypoalbuminemia Albumin 2.5 Discussed improved diet as below  Obesity BMI 45 Recommend weight loss outpatient with regular physical activity as tolerated and healthy dieting.  Pressure injury, multiple, POA      DVT prophylaxis: heparin injection 5,000 Units Start: 06/13/23 2200 Code Status:   Code Status: Full Code Family Communication: None present  Status is: Inpatient  Dispo: The patient is from: Home              Anticipated d/c is to: To be determined              Anticipated d/c date is: 48-72 hours              Patient currently not medically stable for discharge  Consultants:  Orthopedic surgery  Procedures:  None  Antimicrobials:  Cefepime  Subjective: No acute issues or events overnight, continues to complain of diffuse pain lower extremities, notably bilateral knees but states this is consistent with her chronic pain.  No fevers chills nausea vomiting dysuria diarrhea or constipation otherwise noted.  Objective: Vitals:   06/13/23 2233 06/13/23 2339 06/14/23 0225 06/14/23 0355  BP: (!) 121/91 (!) 148/83 138/78 (!) 141/71  Pulse: 89 94 (!) 104 (!) 105  Resp: 18 18 18 18   Temp: 98.8 F (37.1 C) 98.9 F (37.2 C) 98.9 F (37.2 C) 99 F (37.2 C)  TempSrc: Oral  Oral Oral  SpO2:  100%  99%  Weight:      Height:        Intake/Output Summary (Last 24 hours) at 06/14/2023 8119 Last data filed at 06/14/2023 0436 Gross per 24 hour  Intake 1299.77 ml  Output --  Net 1299.77 ml   Filed Weights   06/13/23 1300  Weight: 113.4 kg    Examination:  General:  Pleasantly resting in bed, No acute distress. HEENT:  Normocephalic atraumatic.  Sclerae nonicteric, noninjected.  Extraocular movements intact bilaterally. Neck:  Without mass or deformity.  Trachea is midline. Lungs:  Clear to  auscultate bilaterally without rhonchi, wheeze, or rales. Heart:  Regular rate and rhythm.  Without murmurs, rubs, or gallops. Abdomen:  Soft, nontender, nondistended.  Without guarding or rebound. Extremities: Noted pressure injuries at left lateral malleolus right buttock and left buttock     Data Reviewed: I have personally reviewed following labs and imaging studies  CBC: Recent Labs  Lab 06/13/23 1300 06/14/23 0515  WBC 6.7 6.2  NEUTROABS 4.0  --   HGB 10.3* 10.9*  HCT 35.8* 37.0  MCV 86.7 84.5  PLT 245 262   Basic Metabolic Panel: Recent Labs  Lab 06/13/23 1300 06/14/23 0515  NA 141 141  K 3.8 3.9  CL 107 106  CO2 23 23  GLUCOSE 109* 100*  BUN 19 14  CREATININE 0.56 0.48  CALCIUM 8.7* 9.2  MG  --  1.6*  PHOS  --  2.6   GFR: Estimated Creatinine Clearance: 95.7 mL/min (by C-G formula based on SCr of 0.48 mg/dL). Liver Function Tests: Recent Labs  Lab 06/13/23 1300  AST 21  ALT 12  ALKPHOS 82  BILITOT 0.3  PROT 7.2  ALBUMIN 2.5*   Coagulation Profile: Recent Labs  Lab 06/13/23 1300  INR 1.1   Sepsis Labs: Recent Labs  Lab 06/13/23 1350 06/13/23 1544  LATICACIDVEN 1.7 1.4    Recent Results (from the past 240 hours)  Blood Culture (routine x 2)     Status: None (Preliminary result)   Collection Time: 06/13/23  1:00 PM   Specimen: BLOOD  Result Value Ref Range Status   Specimen Description BLOOD RIGHT ANTECUBITAL  Final   Special Requests   Final    BOTTLES DRAWN AEROBIC AND ANAEROBIC Blood Culture adequate volume   Culture  Setup Time   Final    GRAM POSITIVE RODS ANAEROBIC BOTTLE ONLY CRITICAL RESULT CALLED TO, READ BACK BY AND VERIFIED WITH: PHARMD G ABBOTT 06/14/2023 @ 0354 BY AB    Culture   Final    NO GROWTH < 24 HOURS Performed at [facility], [address], [place], [place] [address]    Report Status PENDING  Incomplete  Blood Culture (routine x 2)     Status: None (Preliminary result)   Collection Time: 06/13/23   1:03 PM   Specimen: BLOOD  Result Value Ref Range Status   Specimen Description BLOOD LEFT ANTECUBITAL  Final   Special Requests   Final    BOTTLES DRAWN AEROBIC AND ANAEROBIC Blood Culture adequate volume   Culture   Final    NO GROWTH < 12 HOURS Performed at [facility], [address] [place], [place] [address]    Report Status PENDING  Incomplete  Urine Culture     Status: None (Preliminary result)   Collection Time: 06/13/23  5:14 PM   Specimen: Urine, Clean Catch  Result Value Ref Range Status   Specimen Description URINE, CLEAN CATCH  Final   Special Requests   Final    NONE Reflexed from [id number] Performed at [facility], [address] [place], [place] [address]    Culture PENDING  Incomplete   Report Status PENDING  Incomplete  MRSA Next Gen by PCR, Nasal     Status: None   Collection Time: 06/13/23  8:47 PM   Specimen: Nasal Mucosa; Nasal Swab  Result Value Ref Range Status   MRSA by PCR Next Gen NOT DETECTED NOT DETECTED Final    Comment: (NOTE) The GeneXpert MRSA Assay (FDA approved for NASAL specimens only), is one component of a comprehensive MRSA colonization surveillance program. It is not intended to diagnose MRSA infection nor to guide or monitor treatment for MRSA infections. Test performance is not FDA approved in patients less than 42 years old. Performed at [facility], [name] [place], [place] [address]          Radiology Studies: CT Head Wo Contrast Result Date: 06/13/2023 CLINICAL DATA:  Altered mental status. EXAM: CT HEAD WITHOUT CONTRAST TECHNIQUE: Contiguous axial images were obtained from the base of the skull through the vertex without intravenous contrast. RADIATION DOSE REDUCTION: This exam was performed according to the departmental dose-optimization program which includes automated exposure control, adjustment of the mA and/or kV according to patient size and/or use of iterative reconstruction technique.  COMPARISON:  None Available. FINDINGS: Brain: The ventricles and sulci are appropriate size for the patient's age. The gray-white matter discrimination is preserved. There is no acute intracranial hemorrhage. No mass effect or midline shift. No extra-axial fluid collection. Vascular: No hyperdense vessel or unexpected calcification. Skull: Normal. Negative for fracture or focal lesion. Sinuses/Orbits: No acute finding. Other: None IMPRESSION: No acute intracranial pathology. Electronically Signed   By: Elgie Collard M.D.   On: 06/13/2023 16:15   DG Ankle Complete Left Result Date: 06/13/2023 CLINICAL DATA:  Questionable sepsis EXAM: LEFT ANKLE COMPLETE - 3+ VIEW COMPARISON:  03/30/2013 FINDINGS: Frontal, oblique, and lateral views of the left ankle are obtained. Stable position of the orthopedic hardware placed previously, with lateral plate and screw fixation across the distal fibula and 2 cannulated screws traversing the medial malleolus. Bones are diffusely osteopenic. No acute fracture, subluxation, or dislocation. Mild osteoarthritis of the ankle and hindfoot. There is diffuse subcutaneous edema throughout the left lower leg, ankle, and visualized portions of the left foot. No bony destruction or periosteal reaction to suggest osteomyelitis. IMPRESSION: 1. Diffuse soft tissue edema throughout the visualized left lower leg. 2. Diffuse osteopenia.  No acute fracture. 3. Stable ORIF of a bimalleolar fracture. No evidence of orthopedic hardware failure or loosening. Electronically Signed   By: Sharlet Salina M.D.   On: 06/13/2023 14:51   DG Chest Port 1 View Result Date: 06/13/2023 CLINICAL DATA:  Questionable sepsis - evaluate for abnormality. EXAM: PORTABLE CHEST 1 VIEW COMPARISON:  02/18/2021. FINDINGS: Low lung volume. Bilateral lung fields are clear. Bilateral costophrenic angles are clear. Normal cardio-mediastinal silhouette. No acute osseous abnormalities. The soft tissues are within normal limits.  IMPRESSION: No active disease. Electronically Signed   By: Jules Schick M.D.   On: 06/13/2023 14:49   Scheduled Meds:  heparin  5,000 Units Subcutaneous Q8H   Continuous Infusions:  ceFEPime (MAXIPIME) IV Stopped (06/14/23 0409)   lactated ringers Stopped (06/14/23 0331)     LOS: 1 day   Time spent:  Azucena Fallen, DO [facility]  If 7PM-7AM, please contact night-coverage [email or web address]  06/14/2023, 7:12 AM

## 2023-06-14 NOTE — Consult Note (Signed)
 Reason for Consult: Left ankle pressure ulcer Referring Physician: EDP/hospitalist  HPI: [name] is an 53 y.o. female with medical history significant for MS, bedbound, osteomyelitis of the sacrum, osteomyelitis of vertebra, sacral, and sacrococcygeal region, pressure ulcer right buttock and left buttock, both stage IV, followed by wound care, history of recurrent UTIs, Pseudomonas aeruginosa UTI, Citrobacter Freundii UTI, E. coli UTI, who presents to the ER from home via EMS with altered mental status.  Reportedly per her parents, at baseline she is alert and oriented x 4 and this morning she was noted to be altered, therefore they called EMS.   More recently noted patient has developed a small skin pressure ulcer over the lateral aspect of her left ankle.  She does have a remote history of left ankle ORIF by my partner Dr. Victorino Dike.  This has been completely healed and stable for years.  Past Medical History:  Diagnosis Date   Arthritis    MS (multiple sclerosis) (HCC)     Past Surgical History:  Procedure Laterality Date   NO PAST SURGERIES     ORIF ANKLE FRACTURE Left 03/30/2013   Procedure: OPEN REDUCTION INTERNAL FIXATION (ORIF) LEFT ANKLE FRACTURE;  Surgeon: Toni Arthurs, MD;  Location: [facility] OR;  Service: Orthopedics;  Laterality: Left;    No family history on file.  Social History:  reports that she quit smoking about 14 years ago. She started smoking about 34 years ago. She has a 40 pack-year smoking history. She quit smokeless tobacco use about 4 years ago. She reports that she does not drink alcohol and does not use drugs.  Allergies:  Allergies  Allergen Reactions   Silver    Tizanidine Diarrhea   Tomato     Medications: I have reviewed the patient's current medications.  Results for orders placed or performed during the hospital encounter of 06/13/23 (from the past 48 hours)  Comprehensive metabolic panel     Status: Abnormal   Collection Time: 06/13/23  1:00 PM   Result Value Ref Range   Sodium 141 135 - 145 mmol/L   Potassium 3.8 3.5 - 5.1 mmol/L   Chloride 107 98 - 111 mmol/L   CO2 23 22 - 32 mmol/L   Glucose, Bld 109 (H) 70 - 99 mg/dL    Comment: Glucose reference range applies only to samples taken after fasting for at least 8 hours.   BUN 19 6 - 20 mg/dL   Creatinine, Ser 8.41 0.44 - 1.00 mg/dL   Calcium 8.7 (L) 8.9 - 10.3 mg/dL   Total Protein 7.2 6.5 - 8.1 g/dL   Albumin 2.5 (L) 3.5 - 5.0 g/dL   AST 21 15 - 41 U/L   ALT 12 0 - 44 U/L   Alkaline Phosphatase 82 38 - 126 U/L   Total Bilirubin 0.3 0.0 - 1.2 mg/dL   GFR, Estimated >32 >44 mL/min    Comment: (NOTE) Calculated using the CKD-EPI Creatinine Equation (2021)    Anion gap 11 5 - 15    Comment: Performed at [facility], [address], [place], [place] [address]  CBC with Differential     Status: Abnormal   Collection Time: 06/13/23  1:00 PM  Result Value Ref Range   WBC 6.7 4.0 - 10.5 K/uL   RBC 4.13 3.87 - 5.11 MIL/uL   Hemoglobin 10.3 (L) 12.0 - 15.0 g/dL   HCT 25.3 (L) 66.4 - 40.3 %   MCV 86.7 80.0 - 100.0 fL  MCH 24.9 (L) 26.0 - 34.0 pg   MCHC 28.8 (L) 30.0 - 36.0 g/dL   RDW 40.9 (H) 81.1 - 91.4 %   Platelets 245 150 - 400 K/uL   nRBC 0.0 0.0 - 0.2 %   Neutrophils Relative % 61 %   Neutro Abs 4.0 1.7 - 7.7 K/uL   Lymphocytes Relative 29 %   Lymphs Abs 1.9 0.7 - 4.0 K/uL   Monocytes Relative 8 %   Monocytes Absolute 0.6 0.1 - 1.0 K/uL   Eosinophils Relative 2 %   Eosinophils Absolute 0.1 0.0 - 0.5 K/uL   Basophils Relative 0 %   Basophils Absolute 0.0 0.0 - 0.1 K/uL   Immature Granulocytes 0 %   Abs Immature Granulocytes 0.01 0.00 - 0.07 K/uL    Comment: Performed at [facility], [address], [place], [place] [address]  Protime-INR     Status: None   Collection Time: 06/13/23  1:00 PM  Result Value Ref Range   Prothrombin Time 13.9 11.4 - 15.2 seconds   INR 1.1 0.8 - 1.2    Comment: (NOTE) INR goal varies based on device and disease  states. Performed at [facility], [address], [place], [place] [address]   Blood Culture (routine x 2)     Status: None (Preliminary result)   Collection Time: 06/13/23  1:00 PM   Specimen: BLOOD  Result Value Ref Range   Specimen Description BLOOD RIGHT ANTECUBITAL    Special Requests      BOTTLES DRAWN AEROBIC AND ANAEROBIC Blood Culture adequate volume   Culture  Setup Time      GRAM POSITIVE RODS ANAEROBIC BOTTLE ONLY CRITICAL RESULT CALLED TO, READ BACK BY AND VERIFIED WITH: PHARMD G ABBOTT 06/14/2023 @ 0354 BY AB    Culture      NO GROWTH < 24 HOURS Performed at [facility], [address], [place], [place] [address]    Report Status PENDING   hCG, serum, qualitative     Status: None   Collection Time: 06/13/23  1:00 PM  Result Value Ref Range   Preg, Serum NEGATIVE NEGATIVE    Comment:        THE SENSITIVITY OF THIS METHODOLOGY IS >10 mIU/mL. Performed at [facility], [address] [place], [place] [address]   Blood Culture (routine x 2)     Status: None (Preliminary result)   Collection Time: 06/13/23  1:03 PM   Specimen: BLOOD  Result Value Ref Range   Specimen Description BLOOD LEFT ANTECUBITAL    Special Requests      BOTTLES DRAWN AEROBIC AND ANAEROBIC Blood Culture adequate volume   Culture      NO GROWTH < 12 HOURS Performed at [facility], [address], [place], [place] [address]    Report Status PENDING   I-Stat Lactic Acid, ED     Status: None   Collection Time: 06/13/23  1:50 PM  Result Value Ref Range   Lactic Acid, Venous 1.7 0.5 - 1.9 mmol/L  I-Stat Lactic Acid, ED     Status: None   Collection Time: 06/13/23  3:44 PM  Result Value Ref Range   Lactic Acid, Venous 1.4 0.5 - 1.9 mmol/L  Urinalysis, w/ Reflex to Culture (Infection Suspected) -Urine, Clean Catch     Status: Abnormal   Collection Time: 06/13/23  5:14 PM  Result Value Ref Range   Specimen Source URINE, CLEAN CATCH  Color, Urine STRAW (A) YELLOW    APPearance CLEAR CLEAR   Specific Gravity, Urine 1.012 1.005 - 1.030   pH 5.0 5.0 - 8.0   Glucose, UA NEGATIVE NEGATIVE mg/dL   Hgb urine dipstick NEGATIVE NEGATIVE   Bilirubin Urine NEGATIVE NEGATIVE   Ketones, ur NEGATIVE NEGATIVE mg/dL   Protein, ur NEGATIVE NEGATIVE mg/dL   Nitrite NEGATIVE NEGATIVE   Leukocytes,Ua MODERATE (A) NEGATIVE   RBC / HPF 6-10 0 - 5 RBC/hpf   WBC, UA 11-20 0 - 5 WBC/hpf    Comment:        Reflex urine culture not performed if WBC <=10, OR if Squamous epithelial cells >5. If Squamous epithelial cells >5 suggest recollection.    Bacteria, UA RARE (A) NONE SEEN   Squamous Epithelial / HPF 0-5 0 - 5 /HPF   Mucus PRESENT     Comment: Performed at [facility], [address] [place], [place] [address]  Urine Culture     Status: None (Preliminary result)   Collection Time: 06/13/23  5:14 PM   Specimen: Urine, Clean Catch  Result Value Ref Range   Specimen Description URINE, CLEAN CATCH    Special Requests      NONE Reflexed from [id number] Performed at [facility], [address] [place], [place] [address]    Culture PENDING    Report Status PENDING   MRSA Next Gen by PCR, Nasal     Status: None   Collection Time: 06/13/23  8:47 PM   Specimen: Nasal Mucosa; Nasal Swab  Result Value Ref Range   MRSA by PCR Next Gen NOT DETECTED NOT DETECTED    Comment: (NOTE) The GeneXpert MRSA Assay (FDA approved for NASAL specimens only), is one component of a comprehensive MRSA colonization surveillance program. It is not intended to diagnose MRSA infection nor to guide or monitor treatment for MRSA infections. Test performance is not FDA approved in patients less than 7 years old. Performed at [facility], [address] [place], [place] [address]   CBC     Status: Abnormal   Collection Time: 06/14/23  5:15 AM  Result Value Ref Range   WBC 6.2 4.0 - 10.5 K/uL   RBC 4.38 3.87 - 5.11 MIL/uL   Hemoglobin 10.9 (L) 12.0 - 15.0 g/dL    HCT 91.4 78.2 - 95.6 %   MCV 84.5 80.0 - 100.0 fL   MCH 24.9 (L) 26.0 - 34.0 pg   MCHC 29.5 (L) 30.0 - 36.0 g/dL   RDW 21.3 (H) 08.6 - 57.8 %   Platelets 262 150 - 400 K/uL   nRBC 0.0 0.0 - 0.2 %    Comment: Performed at [facility], [address], [place], [place] [address]  Basic metabolic panel     Status: Abnormal   Collection Time: 06/14/23  5:15 AM  Result Value Ref Range   Sodium 141 135 - 145 mmol/L   Potassium 3.9 3.5 - 5.1 mmol/L   Chloride 106 98 - 111 mmol/L   CO2 23 22 - 32 mmol/L   Glucose, Bld 100 (H) 70 - 99 mg/dL    Comment: Glucose reference range applies only to samples taken after fasting for at least 8 hours.   BUN 14 6 - 20 mg/dL   Creatinine, Ser 9.52 0.44 - 1.00 mg/dL   Calcium 9.2 8.9 - 84.1 mg/dL   GFR, Estimated >32 >44 mL/min    Comment: (NOTE) Calculated using  the CKD-EPI Creatinine Equation (2021)    Anion gap 12 5 - 15    Comment: Performed at [facility], [address] [place], [place] [address]  Magnesium     Status: Abnormal   Collection Time: 06/14/23  5:15 AM  Result Value Ref Range   Magnesium 1.6 (L) 1.7 - 2.4 mg/dL    Comment: Performed at [facility], [address] [place], [place] [address]  Phosphorus     Status: None   Collection Time: 06/14/23  5:15 AM  Result Value Ref Range   Phosphorus 2.6 2.5 - 4.6 mg/dL    Comment: Performed at [facility], [address] [place], [place] [address]    CT Head Wo Contrast Result Date: 06/13/2023 CLINICAL DATA:  Altered mental status. EXAM: CT HEAD WITHOUT CONTRAST TECHNIQUE: Contiguous axial images were obtained from the base of the skull through the vertex without intravenous contrast. RADIATION DOSE REDUCTION: This exam was performed according to the departmental dose-optimization program which includes automated exposure control, adjustment of the mA and/or kV according to patient size and/or use of iterative reconstruction technique. COMPARISON:  None  Available. FINDINGS: Brain: The ventricles and sulci are appropriate size for the patient's age. The gray-white matter discrimination is preserved. There is no acute intracranial hemorrhage. No mass effect or midline shift. No extra-axial fluid collection. Vascular: No hyperdense vessel or unexpected calcification. Skull: Normal. Negative for fracture or focal lesion. Sinuses/Orbits: No acute finding. Other: None IMPRESSION: No acute intracranial pathology. Electronically Signed   By: Elgie Collard M.D.   On: 06/13/2023 16:15   DG Ankle Complete Left Result Date: 06/13/2023 CLINICAL DATA:  Questionable sepsis EXAM: LEFT ANKLE COMPLETE - 3+ VIEW COMPARISON:  03/30/2013 FINDINGS: Frontal, oblique, and lateral views of the left ankle are obtained. Stable position of the orthopedic hardware placed previously, with lateral plate and screw fixation across the distal fibula and 2 cannulated screws traversing the medial malleolus. Bones are diffusely osteopenic. No acute fracture, subluxation, or dislocation. Mild osteoarthritis of the ankle and hindfoot. There is diffuse subcutaneous edema throughout the left lower leg, ankle, and visualized portions of the left foot. No bony destruction or periosteal reaction to suggest osteomyelitis. IMPRESSION: 1. Diffuse soft tissue edema throughout the visualized left lower leg. 2. Diffuse osteopenia.  No acute fracture. 3. Stable ORIF of a bimalleolar fracture. No evidence of orthopedic hardware failure or loosening. Electronically Signed   By: Sharlet Salina M.D.   On: 06/13/2023 14:51   DG Chest Port 1 View Result Date: 06/13/2023 CLINICAL DATA:  Questionable sepsis - evaluate for abnormality. EXAM: PORTABLE CHEST 1 VIEW COMPARISON:  02/18/2021. FINDINGS: Low lung volume. Bilateral lung fields are clear. Bilateral costophrenic angles are clear. Normal cardio-mediastinal silhouette. No acute osseous abnormalities. The soft tissues are within normal limits. IMPRESSION: No  active disease. Electronically Signed   By: Jules Schick M.D.   On: 06/13/2023 14:49     Vitals Temp:  [97 F (36.1 C)-99.5 F (37.5 C)] 99.5 F (37.5 C) (04/05 0810) Pulse Rate:  [75-111] 109 (04/05 0811) Resp:  [10-20] 20 (04/05 0810) BP: (119-148)/(68-91) 133/72 (04/05 0810) SpO2:  [98 %-100 %] 100 % (04/05 0811) Weight:  [113.4 kg] 113.4 kg (04/04 1300) Body mass index is 45.73 kg/m.  Physical Exam: Patient resting supine in bed this a.m.  Lower extremities splayed out in a "frog" position with lateral aspect of both legs and ankles in dependent positions.  Generalized swelling and  dependent edema noted over the lateral aspect of the leg and ankle on the left.  Mepilex dressing in place.  Removed for inspection and noted to be an approximate 1 cm full-thickness skin ulceration over the apex of the fibula.  Reportedly hardware visualized on earlier exams, however not prominent currently.  No purulent drainage.  Recent plain films of the left ankle are reviewed which show stable hardware and evidence for complete and solid bony union from previous bimalleolar fracture.  No obvious evidence for bony erosion or changes suggestive of osteomyelitis.  Diffuse osteopenia noted as would be expected given the patient's nonambulatory status.     Assessment/Plan: Impression:  #1  Nonambulator with history of multiple sclerosis, known chronic osteomyelitis and ulcerations of the sacrum as well as buttocks with underlying osteomyelitis of the vertebra, stage IV ulcerations, followed by wound care.  2.  History of recurrent UTIs  3.  Recent development of left lateral ankle skin pressure breakdown over the apex of the distal fibula with underlying hardware.  Plain films do not show obvious evidence to suggest bony erosion or osteomyelitis and clinical exam does not suggest acute infection. Treatment:  Continue with local wound care and pressure reduction over the lateral aspect of the ankle.   Posterior splint is not necessary.  Pad and protect the area to prevent direct pressure over the wound.  Please contact wound care team and inform of patient's admission.  Dr. Shon Baton will follow-up on Monday with further recommendations.  Syrus Nakama M Zade Falkner 06/14/2023, 9:08 AM  Contact # [phone]

## 2023-06-14 NOTE — Progress Notes (Signed)
   06/14/23 0225  Vitals  Temp 98.9 F (37.2 C)  Temp Source Oral  BP 138/78  MAP (mmHg) 96  BP Location Left Arm  BP Method Automatic  Patient Position (if appropriate) Lying  Pulse Rate (!) 104  Pulse Rate Source Monitor  Resp 18  Level of Consciousness  Level of Consciousness Alert

## 2023-06-15 ENCOUNTER — Other Ambulatory Visit: Payer: Self-pay

## 2023-06-15 ENCOUNTER — Encounter (HOSPITAL_COMMUNITY): Payer: Self-pay | Admitting: Internal Medicine

## 2023-06-15 DIAGNOSIS — G35 Multiple sclerosis: Secondary | ICD-10-CM | POA: Diagnosis not present

## 2023-06-15 DIAGNOSIS — R410 Disorientation, unspecified: Secondary | ICD-10-CM | POA: Diagnosis not present

## 2023-06-15 DIAGNOSIS — L8990 Pressure ulcer of unspecified site, unspecified stage: Secondary | ICD-10-CM | POA: Diagnosis not present

## 2023-06-15 DIAGNOSIS — N3000 Acute cystitis without hematuria: Secondary | ICD-10-CM | POA: Diagnosis not present

## 2023-06-15 MED ORDER — OXYCODONE HCL 5 MG PO TABS
2.5000 mg | ORAL_TABLET | Freq: Three times a day (TID) | ORAL | Status: AC | PRN
  Administered 2023-06-16 – 2023-06-17 (×4): 2.5 mg via ORAL
  Filled 2023-06-15 (×4): qty 1

## 2023-06-15 MED ORDER — NAPROXEN 250 MG PO TABS
375.0000 mg | ORAL_TABLET | Freq: Three times a day (TID) | ORAL | Status: DC | PRN
  Administered 2023-06-15 – 2023-06-18 (×5): 375 mg via ORAL
  Filled 2023-06-15 (×5): qty 2

## 2023-06-15 MED ORDER — NAPROXEN 250 MG PO TABS: 375.0000 mg | ORAL_TABLET | Freq: Three times a day (TID) | ORAL | Status: DC

## 2023-06-15 NOTE — Progress Notes (Signed)
 PROGRESS NOTE    [name]  ZOX:[id number] DOB: 1971-01-17 DOA: 06/13/2023 PCP: Annita Brod, MD   Brief Narrative:  [name] is a 53 y.o. female with medical history significant for MS, bedbound, osteomyelitis of the sacrum, osteomyelitis of vertebra, sacral, and sacrococcygeal region, known stage IV pressure ulcer right buttock and left buttock followed by wound care in the outpatient setting, noted recurrent history of UTI previously with Pseudomonas Citrobacter and E. coli.  Patient presents from home with acute metabolic encephalopathy concerning for possible infection.  ED workup without overt findings, patient did not meet sepsis criteria and imaging was unremarkable for acute findings at intake.  UA without clear infection, cultures pending.  Hospitalist called for admission.  Orthopedics called in consult over left lateral malleolus pressure ulcer near prior hardware placement.  Assessment & Plan:   Principal Problem:   AMS (altered mental status)   Acute encephalopathy, metabolic versus toxic -Etiology remains unclear, patient improving with supportive care and antibiotics -Wean narcotics aggressively given polypharmacy as potential although no overt CNS depressants on home medication list other than tramadol   Rule out UTI, POA Rule out pressure wounds infection, POA. -Patient does not meet sepsis criteria  -Ortho consulted in regards to left lateral malleolus pressure wound near prior hardware, appreciate insight recommendations -Cefepime ongoing for coverage given sensitivities of prior Pseudomonas, Citrobacter -Cultures pending, MRSA swab negative   Multiple sclerosis Bedbound status at baseline, continue frequent turns Local wound care ongoing, see below   Multiple pressure wounds Intractable pain -Follows outpatient wound care, wound care consulted while inpatient, continue per their guidance -Wean narcotics aggressively in the setting of  encephalopathy -Patient states her home regimen including meloxicam covers her daily pain quite well - wean off IV narcotics - if well controlled will discharge in the next 24h   Anemia of chronic disease in the setting of chronic pressure wounds Stable at baseline   Hypoalbuminemia Albumin 2.5 Discussed improved diet as below  Obesity BMI 45 Recommend weight loss outpatient with regular physical activity as tolerated and healthy dieting.  Pressure injury, multiple, POA      DVT prophylaxis: heparin injection 5,000 Units Start: 06/13/23 2200 Code Status:   Code Status: Full Code Family Communication: None present  Status is: Inpatient  Dispo: The patient is from: Home              Anticipated d/c is to: To be determined              Anticipated d/c date is: 48-72 hours              Patient currently not medically stable for discharge  Consultants:  Orthopedic surgery  Procedures:  None  Antimicrobials:  Cefepime  Subjective: No acute issues or events overnight, continues to complain of diffuse pain lower extremities, notably bilateral knees but states this is consistent with her chronic pain.  No fevers chills nausea vomiting dysuria diarrhea or constipation otherwise noted.  Objective: Vitals:   06/14/23 1552 06/14/23 1712 06/14/23 2025 06/15/23 0657  BP: (!) 145/90 (!) 145/90 (!) 159/97 (!) 147/84  Pulse: (!) 139 (!) 139  (!) 108  Resp: 16  18 18   Temp: 99.4 F (37.4 C)  98.4 F (36.9 C) 98.3 F (36.8 C)  TempSrc: Oral  Oral Oral  SpO2: 98%  98% 100%  Weight:      Height:        Intake/Output Summary (Last 24 hours) at 06/15/2023 0700 Last  data filed at 06/15/2023 0449 Gross per 24 hour  Intake 597 ml  Output 400 ml  Net 197 ml   Filed Weights   06/13/23 1300  Weight: 113.4 kg    Examination:  General:  Pleasantly resting in bed, No acute distress. HEENT:  Normocephalic atraumatic.  Sclerae nonicteric, noninjected.  Extraocular movements intact  bilaterally. Neck:  Without mass or deformity.  Trachea is midline. Lungs:  Clear to auscultate bilaterally without rhonchi, wheeze, or rales. Heart:  Regular rate and rhythm.  Without murmurs, rubs, or gallops. Abdomen:  Soft, nontender, nondistended.  Without guarding or rebound. Extremities: Noted pressure injuries at left lateral malleolus right buttock and left buttock     Data Reviewed: I have personally reviewed following labs and imaging studies  CBC: Recent Labs  Lab 06/13/23 1300 06/14/23 0515  WBC 6.7 6.2  NEUTROABS 4.0  --   HGB 10.3* 10.9*  HCT 35.8* 37.0  MCV 86.7 84.5  PLT 245 262   Basic Metabolic Panel: Recent Labs  Lab 06/13/23 1300 06/14/23 0515  NA 141 141  K 3.8 3.9  CL 107 106  CO2 23 23  GLUCOSE 109* 100*  BUN 19 14  CREATININE 0.56 0.48  CALCIUM 8.7* 9.2  MG  --  1.6*  PHOS  --  2.6   GFR: Estimated Creatinine Clearance: 95.7 mL/min (by C-G formula based on SCr of 0.48 mg/dL). Liver Function Tests: Recent Labs  Lab 06/13/23 1300  AST 21  ALT 12  ALKPHOS 82  BILITOT 0.3  PROT 7.2  ALBUMIN 2.5*   Coagulation Profile: Recent Labs  Lab 06/13/23 1300  INR 1.1   Sepsis Labs: Recent Labs  Lab 06/13/23 1350 06/13/23 1544  LATICACIDVEN 1.7 1.4    Recent Results (from the past 240 hours)  Blood Culture (routine x 2)     Status: None (Preliminary result)   Collection Time: 06/13/23  1:00 PM   Specimen: BLOOD  Result Value Ref Range Status   Specimen Description BLOOD RIGHT ANTECUBITAL  Final   Special Requests   Final    BOTTLES DRAWN AEROBIC AND ANAEROBIC Blood Culture adequate volume   Culture  Setup Time   Final    GRAM POSITIVE RODS ANAEROBIC BOTTLE ONLY CRITICAL RESULT CALLED TO, READ BACK BY AND VERIFIED WITH: PHARMD G ABBOTT 06/14/2023 @ 0354 BY AB GRAM POSITIVE COCCI AEROBIC BOTTLE ONLY CRITICAL RESULT CALLED TO, READ BACK BY AND VERIFIED WITH: PHARMD Phillips Climes [id number] AT 1116 BY EC Performed at [facility]  Lab, [address], [place], [place] [address]    Culture GRAM POSITIVE RODS GRAM POSITIVE COCCI   Final   Report Status PENDING  Incomplete  Blood Culture ID Panel (Reflexed)     Status: Abnormal   Collection Time: 06/13/23  1:00 PM  Result Value Ref Range Status   Enterococcus faecalis NOT DETECTED NOT DETECTED Final   Enterococcus Faecium NOT DETECTED NOT DETECTED Final   Listeria monocytogenes NOT DETECTED NOT DETECTED Final   Staphylococcus species DETECTED (A) NOT DETECTED Final    Comment: CRITICAL RESULT CALLED TO, READ BACK BY AND VERIFIED WITH: PHARMD THUY DANG [id number] AT 1116 BY EC    Staphylococcus aureus (BCID) NOT DETECTED NOT DETECTED Final   Staphylococcus epidermidis DETECTED (A) NOT DETECTED Final    Comment: Methicillin (oxacillin) resistant coagulase negative staphylococcus. Possible blood culture contaminant (unless isolated from more than one blood culture draw or clinical case suggests pathogenicity). No antibiotic treatment is indicated for  blood  culture contaminants. CRITICAL RESULT CALLED TO, READ BACK BY AND VERIFIED WITH: PHARMD THUY DANG [id number] AT 1116 BY EC    Staphylococcus lugdunensis NOT DETECTED NOT DETECTED Final   Streptococcus species NOT DETECTED NOT DETECTED Final   Streptococcus agalactiae NOT DETECTED NOT DETECTED Final   Streptococcus pneumoniae NOT DETECTED NOT DETECTED Final   Streptococcus pyogenes NOT DETECTED NOT DETECTED Final   A.calcoaceticus-baumannii NOT DETECTED NOT DETECTED Final   Bacteroides fragilis NOT DETECTED NOT DETECTED Final   Enterobacterales NOT DETECTED NOT DETECTED Final   Enterobacter cloacae complex NOT DETECTED NOT DETECTED Final   Escherichia coli NOT DETECTED NOT DETECTED Final   Klebsiella aerogenes NOT DETECTED NOT DETECTED Final   Klebsiella oxytoca NOT DETECTED NOT DETECTED Final   Klebsiella pneumoniae NOT DETECTED NOT DETECTED Final   Proteus species NOT DETECTED NOT DETECTED Final   Salmonella species  NOT DETECTED NOT DETECTED Final   Serratia marcescens NOT DETECTED NOT DETECTED Final   Haemophilus influenzae NOT DETECTED NOT DETECTED Final   Neisseria meningitidis NOT DETECTED NOT DETECTED Final   Pseudomonas aeruginosa NOT DETECTED NOT DETECTED Final   Stenotrophomonas maltophilia NOT DETECTED NOT DETECTED Final   Candida albicans NOT DETECTED NOT DETECTED Final   Candida auris NOT DETECTED NOT DETECTED Final   Candida glabrata NOT DETECTED NOT DETECTED Final   Candida krusei NOT DETECTED NOT DETECTED Final   Candida parapsilosis NOT DETECTED NOT DETECTED Final   Candida tropicalis NOT DETECTED NOT DETECTED Final   Cryptococcus neoformans/gattii NOT DETECTED NOT DETECTED Final   Methicillin resistance mecA/C DETECTED (A) NOT DETECTED Final    Comment: CRITICAL RESULT CALLED TO, READ BACK BY AND VERIFIED WITH: PHARMD Phillips Climes [id number] AT 1116 BY EC Performed at [facility], [address] [place], [place] [address]   Blood Culture (routine x 2)     Status: None (Preliminary result)   Collection Time: 06/13/23  1:03 PM   Specimen: BLOOD  Result Value Ref Range Status   Specimen Description BLOOD LEFT ANTECUBITAL  Final   Special Requests   Final    BOTTLES DRAWN AEROBIC AND ANAEROBIC Blood Culture adequate volume   Culture   Final    NO GROWTH < 12 HOURS Performed at [facility], [address], [place], [place] [address]    Report Status PENDING  Incomplete  Urine Culture     Status: Abnormal (Preliminary result)   Collection Time: 06/13/23  5:14 PM   Specimen: Urine, Clean Catch  Result Value Ref Range Status   Specimen Description URINE, CLEAN CATCH  Final   Special Requests NONE Reflexed from [id number]  Final   Culture (A)  Final    >=100,000 COLONIES/mL GRAM NEGATIVE RODS CULTURE REINCUBATED FOR BETTER GROWTH Performed at [facility], [address], [place], [place] [address]    Report Status PENDING  Incomplete  MRSA Next Gen by PCR, Nasal      Status: None   Collection Time: 06/13/23  8:47 PM   Specimen: Nasal Mucosa; Nasal Swab  Result Value Ref Range Status   MRSA by PCR Next Gen NOT DETECTED NOT DETECTED Final    Comment: (NOTE) The GeneXpert MRSA Assay (FDA approved for NASAL specimens only), is one component of a comprehensive MRSA colonization surveillance program. It is not intended to diagnose MRSA infection nor to guide or monitor treatment for MRSA infections. Test performance is not FDA approved in patients less than 20 years old. Performed  at [facility], [address], [place], [place] [address]          Radiology Studies: CT Head Wo Contrast Result Date: 06/13/2023 CLINICAL DATA:  Altered mental status. EXAM: CT HEAD WITHOUT CONTRAST TECHNIQUE: Contiguous axial images were obtained from the base of the skull through the vertex without intravenous contrast. RADIATION DOSE REDUCTION: This exam was performed according to the departmental dose-optimization program which includes automated exposure control, adjustment of the mA and/or kV according to patient size and/or use of iterative reconstruction technique. COMPARISON:  None Available. FINDINGS: Brain: The ventricles and sulci are appropriate size for the patient's age. The gray-white matter discrimination is preserved. There is no acute intracranial hemorrhage. No mass effect or midline shift. No extra-axial fluid collection. Vascular: No hyperdense vessel or unexpected calcification. Skull: Normal. Negative for fracture or focal lesion. Sinuses/Orbits: No acute finding. Other: None IMPRESSION: No acute intracranial pathology. Electronically Signed   By: Elgie Collard M.D.   On: 06/13/2023 16:15   DG Ankle Complete Left Result Date: 06/13/2023 CLINICAL DATA:  Questionable sepsis EXAM: LEFT ANKLE COMPLETE - 3+ VIEW COMPARISON:  03/30/2013 FINDINGS: Frontal, oblique, and lateral views of the left ankle are obtained. Stable position of the orthopedic hardware  placed previously, with lateral plate and screw fixation across the distal fibula and 2 cannulated screws traversing the medial malleolus. Bones are diffusely osteopenic. No acute fracture, subluxation, or dislocation. Mild osteoarthritis of the ankle and hindfoot. There is diffuse subcutaneous edema throughout the left lower leg, ankle, and visualized portions of the left foot. No bony destruction or periosteal reaction to suggest osteomyelitis. IMPRESSION: 1. Diffuse soft tissue edema throughout the visualized left lower leg. 2. Diffuse osteopenia.  No acute fracture. 3. Stable ORIF of a bimalleolar fracture. No evidence of orthopedic hardware failure or loosening. Electronically Signed   By: Sharlet Salina M.D.   On: 06/13/2023 14:51   DG Chest Port 1 View Result Date: 06/13/2023 CLINICAL DATA:  Questionable sepsis - evaluate for abnormality. EXAM: PORTABLE CHEST 1 VIEW COMPARISON:  02/18/2021. FINDINGS: Low lung volume. Bilateral lung fields are clear. Bilateral costophrenic angles are clear. Normal cardio-mediastinal silhouette. No acute osseous abnormalities. The soft tissues are within normal limits. IMPRESSION: No active disease. Electronically Signed   By: Jules Schick M.D.   On: 06/13/2023 14:49   Scheduled Meds:  busPIRone  5 mg Oral Daily   carvedilol  3.125 mg Oral BID WC   gabapentin  300 mg Oral TID WC   gabapentin  600 mg Oral QHS   heparin  5,000 Units Subcutaneous Q8H   Continuous Infusions:  ceFEPime (MAXIPIME) IV 2 g (06/15/23 0449)     LOS: 2 days   Time spent:  Azucena Fallen, DO [facility]  If 7PM-7AM, please contact night-coverage [email or web address]  06/15/2023, 7:00 AM

## 2023-06-15 NOTE — Progress Notes (Signed)
 [name] [phone] [facility] Liaison Note   This patient is enrolled in the Home Based Primary Care program of [employer].   In the absence of acute hospitalization needs, this patient can be managed tomorrow at home with a provider visit for any needed follow up, including lab work, Veterinary surgeon, Catering manager.   Hospital Liaison Team will follow for disposition.   Please reach out if there are questions or concerns.   Roe Rutherford, BSN, [employer] [phone]

## 2023-06-15 NOTE — Plan of Care (Signed)
  Problem: Clinical Measurements: Goal: Will remain free from infection Outcome: Progressing Goal: Diagnostic test results will improve Outcome: Progressing Goal: Respiratory complications will improve Outcome: Progressing   Problem: Nutrition: Goal: Adequate nutrition will be maintained Outcome: Progressing   Problem: Coping: Goal: Level of anxiety will decrease Outcome: Progressing   Problem: Safety: Goal: Ability to remain free from injury will improve Outcome: Progressing   Problem: Skin Integrity: Goal: Risk for impaired skin integrity will decrease Outcome: Progressing

## 2023-06-16 ENCOUNTER — Other Ambulatory Visit: Payer: Self-pay | Admitting: Student

## 2023-06-16 ENCOUNTER — Other Ambulatory Visit (HOSPITAL_COMMUNITY): Payer: Self-pay

## 2023-06-16 DIAGNOSIS — S81802A Unspecified open wound, left lower leg, initial encounter: Secondary | ICD-10-CM

## 2023-06-16 DIAGNOSIS — R410 Disorientation, unspecified: Secondary | ICD-10-CM | POA: Diagnosis not present

## 2023-06-16 LAB — CULTURE, BLOOD (ROUTINE X 2): Special Requests: ADEQUATE

## 2023-06-16 MED ORDER — OXYCODONE HCL 5 MG PO TABS
2.5000 mg | ORAL_TABLET | Freq: Three times a day (TID) | ORAL | 0 refills | Status: AC | PRN
  Filled 2023-06-16: qty 5, 4d supply, fill #0

## 2023-06-16 MED ORDER — ZINC OXIDE 40 % EX OINT
TOPICAL_OINTMENT | Freq: Two times a day (BID) | CUTANEOUS | Status: DC
  Filled 2023-06-16: qty 57

## 2023-06-16 NOTE — Consult Note (Addendum)
 WOC Nurse Consult Note: patient with longstanding stage 4 Pressure Injuries to R buttock and L ischium; followed at [facility] last seen 04/15/2023 with orders for collagen dressing; [facility] does not have a collagen dressing on formulary and will use silver as substitute  Left lateral ankle wound evaluated by ortho Reason for Consult: L ankle wound, R buttock pressure wound  Wound type: 1. Full thickness L ankle wound likely from pressure as orthopedist note describes legs as splayed out placing pressure on lateral aspects of ankles  2.  Stage 4 Pressure Injuries to R glute and L ischium (per [facility] notes)  Pressure Injury POA: Yes Measurement: see nursing flowsheet; per [facility] notes 04/15/2023 R glute 3.8 cm x 5 cm x 2 cm with undermining 2.8 cm from 7 to 11 o'clock per their note; L ischium  0.8 cm x 1.5 cm x 0.6 cm   Wound bed: R glute and L ischium not visualized however described as healthy granulation tissue by [facility] MD;  L lateral ankle 50% pink 50% yellow  Drainage (amount, consistency, odor) per nursing flowsheet  Periwound: ankle intact  Dressing procedure/placement/frequency:  Cleanse L lateral ankle wound with Vashe wound cleanser [employer] [place] [phone]), do not rinse and allow to air dry.  Apply a piece of silver hydrofiber [employer] [place] [phone]) cut to fit wound bed daily, cover with dry gauze and silicone foam or Kerlix roll gauze.  Place B feet in Prevalon boots to offload pressure [employer] [place] [phone]).  Cleanse R buttock and L ischial wounds with Vashe wound cleanser [employer] [place] [phone]) do not rinse and allow to air dry.  Using a Q tip applicator apply silver hydrofiber [employer] [place] [phone])  to wound bed making sure to cover any areas of undermining.  Cover with dry gauze and ABD pad or silicone foam whichever is preferred.  Patient should be placed on a low air loss mattress for pressure redistribution and moisture management.   POC discussed with bedside nurse. WOC team will not follow. Re-consult if further needs arise.    At discharge patient should resume collagen dressing to R glute and L ischial wound at home as per [facility] order.  Also continue with every 3 month follow-up visits with their office.    Thank you,    Priscella Mann MSN, RN-BC, [employer] [phone]

## 2023-06-16 NOTE — Progress Notes (Signed)
 See note by Caroline More, PA-C

## 2023-06-16 NOTE — H&P (View-Only) (Signed)
 [facility]  Reason for Consult: Left lower extremity wound Referring Physician: Dr. Venita Lick  [name] is an 53 y.o. female.  HPI: 53 year old female past medical history significant for MS, bedbound, osteomyelitis of her sacrum, osteomyelitis of vertebra, sacral and sacral coccygeal region with a known stage IV pressure ulcer on the right buttock and left buttock which is managed by outpatient wound care.  Patient presented to ED on 06/13/2023 with AMS.  Patient admitted by hospitalist medicine for AMS, UTI treatment and for further management of exposed hardware.  Patient has left ankle wound with hardware exposed over the left lateral malleolus.  Surgery consulted for assistance with closure/wound management.  Today, patient reports she is doing okay.  She states that she thinks she might be discharged soon.  Past Medical History:  Diagnosis Date   Arthritis    MS (multiple sclerosis) (HCC)     Past Surgical History:  Procedure Laterality Date   NO PAST SURGERIES     ORIF ANKLE FRACTURE Left 03/30/2013   Procedure: OPEN REDUCTION INTERNAL FIXATION (ORIF) LEFT ANKLE FRACTURE;  Surgeon: Toni Arthurs, MD;  Location: [facility] OR;  Service: Orthopedics;  Laterality: Left;    History reviewed. No pertinent family history.  Social History:  reports that she quit smoking about 14 years ago. Her smoking use included cigarettes. She started smoking about 34 years ago. She has a 40 pack-year smoking history. She quit smokeless tobacco use about 4 years ago. She reports that she does not drink alcohol and does not use drugs.  Allergies:  Allergies  Allergen Reactions   Silver Other (See Comments)    Irritation    Tizanidine Diarrhea   Tomato Rash    Medications: I have reviewed the patient's current medications.  No results found for this or any previous visit (from the past 48 hours).   No results found.  ROS Does not report any infectious symptoms   Blood pressure 132/84, pulse (!) 107, temperature 98.4 F (36.9 C), resp. rate 17, height 5\' 2"  (1.575 m), weight 113.4 kg, SpO2 98%. Physical Exam -General: no acute distress, alert -Respiratory: unlabored breathing, no acute distress -LLE: Reviewed photos in the chart. Approximately a 1 cm x 1 cm ulcer noted to the lateral ankle with purulent material versus exudate throughout.   Assessment/Plan:   Plan for irrigation and debridement of left lower extremity wound with placement of myriad wound matrix tomorrow with Dr. Ulice Bold.  N.p.o. after midnight tonight.  Discussed case with Dr. Ulice Bold   Also discussed plans with patient's parents via telephone and they were in agreement with the plan.     Laurena Spies, PA-C 06/16/2023, 4:36 PM

## 2023-06-16 NOTE — TOC Transition Note (Signed)
 Transition of Care [facility]) - Discharge Note   Patient Details  Name: EYANNA MCGONAGLE MRN: [id number] Date of Birth: [birth year]  [facility] CM/SW Contact:  Epifanio Lesches, RN Phone Number: 06/16/2023, 3:41 PM   Clinical Narrative:    Patient will DC to: home Anticipated DC date: 06/16/2023 Family notified: yes, mom Transport by: Sharin Mons  Per MD patient ready for DC today. RN, patient, patient's mom, and Shawn /  Home Based Primary Care Program - [employer] notified of DC. RX medication from [facility] pharmacy will be picked up by staff for pt to go with. DC packet on chart. PTAR /ambulance transport requested for patient.   RNCM will sign off for now as intervention is no longer needed. Please consult [place] again if new needs arise.    Final next level of care: Home w Home Health Services Barriers to Discharge: No Barriers Identified   Patient Goals and CMS Choice            Discharge Placement                       Discharge Plan and Services Additional resources added to the After Visit Summary for                                       Social Drivers of Health (SDOH) Interventions SDOH Screenings   Food Insecurity: No Food Insecurity (06/13/2023)  Housing: Low Risk  (06/15/2023)  Transportation Needs: No Transportation Needs (06/13/2023)  Utilities: Patient Unable To Answer (06/13/2023)  Depression (PHQ2-9): Low Risk  (08/03/2021)  Tobacco Use: Medium Risk (06/15/2023)     Readmission Risk Interventions    02/22/2021   11:57 AM  Readmission Risk Prevention Plan  Post Dischage Appt Complete  Medication Screening Complete  Transportation Screening Complete

## 2023-06-16 NOTE — Discharge Summary (Addendum)
 Physician Discharge Summary  [name]:[id number] DOB: [birth year] DOA: 06/13/2023  PCP: [facility]  Admit date: 06/13/2023 Discharge date: 06/16/2023  Admitted From: Home Disposition:  Home - continue current home health regimen/services  Recommendations for Outpatient Follow-up:  Follow up with PCP in 1-2 weeks  Home Health:Continue prior services  Equipment/Devices:No new equipment  Discharge Condition:Stable  CODE STATUS:Full  Diet recommendation: Resume regular diet   Brief/Interim Summary: [name] is a 53 y.o. female with medical history significant for MS, bedbound, osteomyelitis of the sacrum, osteomyelitis of vertebra, sacral, and sacrococcygeal region, known stage IV pressure ulcer right buttock and left buttock followed by wound care in the outpatient setting, noted recurrent history of UTI previously with Pseudomonas Citrobacter and E. coli.   Patient presents from home with acute metabolic encephalopathy concerning for possible infection.  ED workup without overt findings, patient did not meet sepsis criteria and imaging was unremarkable for acute findings at intake.  UA without clear infection, cultures pending.  Hospitalist called for admission.  Orthopedics called in consult over left lateral malleolus pressure ulcer near prior hardware placement.  Patient admitted as above with acute encephalopathy of unclear etiology - UTI appears to have resolved, unclear if pseudomonas showing up on culture is colonization or true UTI given her improvement on ancef. There was concern that the lateral wound on her left ankle was near an old hardware but given imaging and physical exam this is unlikely. At this time she is otherwise stable and agreeable for discharge home to resume home health services.  **Update 16:55 - Patient initially planned for discharge, remains medically stable. Plastics was consulted and are now recommending LLE debridement.  **Update  18:51 - Spoke with Dr Ulice Bold - will move forward with discharge and she will see the patient in the office per her schedule.  Discharge Diagnoses:  Principal Problem:   AMS (altered mental status)    Discharge Instructions   Allergies as of 06/16/2023       Reactions   Silver Other (See Comments)   Irritation    Tizanidine Diarrhea   Tomato Rash        Medication List     TAKE these medications    acetaminophen 500 MG tablet Commonly known as: TYLENOL Take 1,000 mg by mouth in the morning, at noon, and at bedtime.   ADULT GUMMY PO Take by mouth daily.   baclofen 20 MG tablet Commonly known as: LIORESAL Take 20 mg by mouth 4 (four) times daily.   busPIRone 5 MG tablet Commonly known as: BUSPAR Take 5 mg by mouth daily.   carvedilol 3.125 MG tablet Commonly known as: COREG Take 3.125 mg by mouth 2 (two) times daily with a meal.   gabapentin 300 MG capsule Commonly known as: NEURONTIN Take 300-600 mg by mouth See admin instructions. 300 mg  morning, noon and dinner and 2 capsules (600mg ) at bedtime.   meloxicam 7.5 MG tablet Commonly known as: MOBIC Take 7.5 mg by mouth daily.   mineral oil-hydrophilic petrolatum ointment Apply topically as needed.   omeprazole 40 MG capsule Commonly known as: PRILOSEC Take 40 mg by mouth daily.   oxyCODONE 5 MG immediate release tablet Commonly known as: Oxy IR/ROXICODONE Take 0.5 tablets (2.5 mg total) by mouth every 8 (eight) hours as needed for up to 3 days for breakthrough pain.   traMADol 50 MG tablet Commonly known as: ULTRAM Take 1 tablet (50 mg total) by mouth every 12 (twelve) hours  as needed for moderate pain.   Zinc Oxide 12.8 % ointment Commonly known as: TRIPLE PASTE Apply topically 2 (two) times daily. Apply AROUND sacral wound area. Then follow Dakin's order. What changed:  when to take this reasons to take this additional instructions        Allergies  Allergen Reactions   Silver Other  (See Comments)    Irritation    Tizanidine Diarrhea   Tomato Rash    Consultations: None   Procedures/Studies: CT Head Wo Contrast Result Date: 06/13/2023 CLINICAL DATA:  Altered mental status. EXAM: CT HEAD WITHOUT CONTRAST TECHNIQUE: Contiguous axial images were obtained from the base of the skull through the vertex without intravenous contrast. RADIATION DOSE REDUCTION: This exam was performed according to the departmental dose-optimization program which includes automated exposure control, adjustment of the mA and/or kV according to patient size and/or use of iterative reconstruction technique. COMPARISON:  None Available. FINDINGS: Brain: The ventricles and sulci are appropriate size for the patient's age. The gray-white matter discrimination is preserved. There is no acute intracranial hemorrhage. No mass effect or midline shift. No extra-axial fluid collection. Vascular: No hyperdense vessel or unexpected calcification. Skull: Normal. Negative for fracture or focal lesion. Sinuses/Orbits: No acute finding. Other: None IMPRESSION: No acute intracranial pathology. Electronically Signed   By: Elgie Collard M.D.   On: 06/13/2023 16:15   DG Ankle Complete Left Result Date: 06/13/2023 CLINICAL DATA:  Questionable sepsis EXAM: LEFT ANKLE COMPLETE - 3+ VIEW COMPARISON:  03/30/2013 FINDINGS: Frontal, oblique, and lateral views of the left ankle are obtained. Stable position of the orthopedic hardware placed previously, with lateral plate and screw fixation across the distal fibula and 2 cannulated screws traversing the medial malleolus. Bones are diffusely osteopenic. No acute fracture, subluxation, or dislocation. Mild osteoarthritis of the ankle and hindfoot. There is diffuse subcutaneous edema throughout the left lower leg, ankle, and visualized portions of the left foot. No bony destruction or periosteal reaction to suggest osteomyelitis. IMPRESSION: 1. Diffuse soft tissue edema throughout the  visualized left lower leg. 2. Diffuse osteopenia.  No acute fracture. 3. Stable ORIF of a bimalleolar fracture. No evidence of orthopedic hardware failure or loosening. Electronically Signed   By: Sharlet Salina M.D.   On: 06/13/2023 14:51   DG Chest Port 1 View Result Date: 06/13/2023 CLINICAL DATA:  Questionable sepsis - evaluate for abnormality. EXAM: PORTABLE CHEST 1 VIEW COMPARISON:  02/18/2021. FINDINGS: Low lung volume. Bilateral lung fields are clear. Bilateral costophrenic angles are clear. Normal cardio-mediastinal silhouette. No acute osseous abnormalities. The soft tissues are within normal limits. IMPRESSION: No active disease. Electronically Signed   By: Jules Schick M.D.   On: 06/13/2023 14:49     Subjective: No acute issues or events overnight   Discharge Exam: Vitals:   06/15/23 1958 06/16/23 0615  BP: 127/74 (!) 140/82  Pulse: (!) 110 95  Resp: 16 16  Temp: 98.9 F (37.2 C) 98.3 F (36.8 C)  SpO2: 99% 100%   Vitals:   06/15/23 0739 06/15/23 1637 06/15/23 1958 06/16/23 0615  BP: (!) 146/84 134/83 127/74 (!) 140/82  Pulse: 100 100 (!) 110 95  Resp: 16 (!) 24 16 16   Temp: 98.8 F (37.1 C) 98.7 F (37.1 C) 98.9 F (37.2 C) 98.3 F (36.8 C)  TempSrc: Oral Oral    SpO2: 100% 100% 99% 100%  Weight:      Height:       General:  Pleasantly resting in bed, No acute distress. HEENT:  Normocephalic atraumatic.  Sclerae nonicteric, noninjected.  Extraocular movements intact bilaterally. Neck:  Without mass or deformity.  Trachea is midline. Lungs:  Clear to auscultate bilaterally without rhonchi, wheeze, or rales. Heart:  Regular rate and rhythm.  Without murmurs, rubs, or gallops. Abdomen:  Soft, nontender, nondistended.  Without guarding or rebound. Extremities: Noted pressure injuries at left lateral malleolus right buttock and left buttock  The results of significant diagnostics from this hospitalization (including imaging, microbiology, ancillary and laboratory)  are listed below for reference.     Microbiology: Recent Results (from the past 240 hours)  Blood Culture (routine x 2)     Status: Abnormal   Collection Time: 06/13/23  1:00 PM   Specimen: BLOOD  Result Value Ref Range Status   Specimen Description BLOOD RIGHT ANTECUBITAL  Final   Special Requests   Final    BOTTLES DRAWN AEROBIC AND ANAEROBIC Blood Culture adequate volume   Culture  Setup Time   Final    GRAM POSITIVE RODS ANAEROBIC BOTTLE ONLY CRITICAL RESULT CALLED TO, READ BACK BY AND VERIFIED WITH: PHARMD G ABBOTT 06/14/2023 @ 0354 BY AB GRAM POSITIVE COCCI AEROBIC BOTTLE ONLY CRITICAL RESULT CALLED TO, READ BACK BY AND VERIFIED WITH: PHARMD THUY DANG [id number] AT 1116 BY EC    Culture (A)  Final    STAPHYLOCOCCUS EPIDERMIDIS THE SIGNIFICANCE OF ISOLATING THIS ORGANISM FROM A SINGLE SET OF BLOOD CULTURES WHEN MULTIPLE SETS ARE DRAWN IS UNCERTAIN. PLEASE NOTIFY THE MICROBIOLOGY DEPARTMENT WITHIN ONE WEEK IF SPECIATION AND SENSITIVITIES ARE REQUIRED. CLOSTRIDIUM PERFRINGENS Standardized susceptibility testing for this organism is not available. Performed at [facility], [address], [place], [place] [address]    Report Status 06/16/2023 FINAL  Final  Blood Culture ID Panel (Reflexed)     Status: Abnormal   Collection Time: 06/13/23  1:00 PM  Result Value Ref Range Status   Enterococcus faecalis NOT DETECTED NOT DETECTED Final   Enterococcus Faecium NOT DETECTED NOT DETECTED Final   Listeria monocytogenes NOT DETECTED NOT DETECTED Final   Staphylococcus species DETECTED (A) NOT DETECTED Final    Comment: CRITICAL RESULT CALLED TO, READ BACK BY AND VERIFIED WITH: PHARMD THUY DANG [id number] AT 1116 BY EC    Staphylococcus aureus (BCID) NOT DETECTED NOT DETECTED Final   Staphylococcus epidermidis DETECTED (A) NOT DETECTED Final    Comment: Methicillin (oxacillin) resistant coagulase negative staphylococcus. Possible blood culture contaminant (unless isolated from more  than one blood culture draw or clinical case suggests pathogenicity). No antibiotic treatment is indicated for blood  culture contaminants. CRITICAL RESULT CALLED TO, READ BACK BY AND VERIFIED WITH: PHARMD THUY DANG [phone] AT 1116 BY EC    Staphylococcus lugdunensis NOT DETECTED NOT DETECTED Final   Streptococcus species NOT DETECTED NOT DETECTED Final   Streptococcus agalactiae NOT DETECTED NOT DETECTED Final   Streptococcus pneumoniae NOT DETECTED NOT DETECTED Final   Streptococcus pyogenes NOT DETECTED NOT DETECTED Final   A.calcoaceticus-baumannii NOT DETECTED NOT DETECTED Final   Bacteroides fragilis NOT DETECTED NOT DETECTED Final   Enterobacterales NOT DETECTED NOT DETECTED Final   Enterobacter cloacae complex NOT DETECTED NOT DETECTED Final   Escherichia coli NOT DETECTED NOT DETECTED Final   Klebsiella aerogenes NOT DETECTED NOT DETECTED Final   Klebsiella oxytoca NOT DETECTED NOT DETECTED Final   Klebsiella pneumoniae NOT DETECTED NOT DETECTED Final   Proteus species NOT DETECTED NOT DETECTED Final   Salmonella species NOT DETECTED NOT DETECTED Final   Serratia marcescens NOT DETECTED NOT  DETECTED Final   Haemophilus influenzae NOT DETECTED NOT DETECTED Final   Neisseria meningitidis NOT DETECTED NOT DETECTED Final   Pseudomonas aeruginosa NOT DETECTED NOT DETECTED Final   Stenotrophomonas maltophilia NOT DETECTED NOT DETECTED Final   Candida albicans NOT DETECTED NOT DETECTED Final   Candida auris NOT DETECTED NOT DETECTED Final   Candida glabrata NOT DETECTED NOT DETECTED Final   Candida krusei NOT DETECTED NOT DETECTED Final   Candida parapsilosis NOT DETECTED NOT DETECTED Final   Candida tropicalis NOT DETECTED NOT DETECTED Final   Cryptococcus neoformans/gattii NOT DETECTED NOT DETECTED Final   Methicillin resistance mecA/C DETECTED (A) NOT DETECTED Final    Comment: CRITICAL RESULT CALLED TO, READ BACK BY AND VERIFIED WITH: PHARMD Phillips Climes [id number] AT 1116 BY  EC Performed at [facility], [address] [place], [place] [address]   Blood Culture (routine x 2)     Status: None (Preliminary result)   Collection Time: 06/13/23  1:03 PM   Specimen: BLOOD  Result Value Ref Range Status   Specimen Description BLOOD LEFT ANTECUBITAL  Final   Special Requests   Final    BOTTLES DRAWN AEROBIC AND ANAEROBIC Blood Culture adequate volume   Culture   Final    NO GROWTH 3 DAYS Performed at [facility], [address], [place], [place] [address]    Report Status PENDING  Incomplete  Urine Culture     Status: Abnormal (Preliminary result)   Collection Time: 06/13/23  5:14 PM   Specimen: Urine, Clean Catch  Result Value Ref Range Status   Specimen Description URINE, CLEAN CATCH  Final   Special Requests NONE Reflexed from [id number]  Final   Culture (A)  Final    >=100,000 COLONIES/mL PSEUDOMONAS AERUGINOSA SUSCEPTIBILITIES TO FOLLOW Performed at [facility], [address] [place], [place] [address]    Report Status PENDING  Incomplete  MRSA Next Gen by PCR, Nasal     Status: None   Collection Time: 06/13/23  8:47 PM   Specimen: Nasal Mucosa; Nasal Swab  Result Value Ref Range Status   MRSA by PCR Next Gen NOT DETECTED NOT DETECTED Final    Comment: (NOTE) The GeneXpert MRSA Assay (FDA approved for NASAL specimens only), is one component of a comprehensive MRSA colonization surveillance program. It is not intended to diagnose MRSA infection nor to guide or monitor treatment for MRSA infections. Test performance is not FDA approved in patients less than 6 years old. Performed at [facility], [address], [place], [place] [address]   Culture, blood (Routine X 2) w Reflex to ID Panel     Status: None (Preliminary result)   Collection Time: 06/14/23  3:50 PM   Specimen: BLOOD LEFT ARM  Result Value Ref Range Status   Specimen Description BLOOD LEFT ARM  Final   Special Requests   Final    BOTTLES DRAWN AEROBIC AND  ANAEROBIC Blood Culture adequate volume   Culture   Final    NO GROWTH 2 DAYS Performed at [facility], [address] [place], [place] [address]    Report Status PENDING  Incomplete  Culture, blood (Routine X 2) w Reflex to ID Panel     Status: None (Preliminary result)   Collection Time: 06/14/23  3:51 PM   Specimen: BLOOD RIGHT ARM  Result Value Ref Range Status   Specimen Description BLOOD RIGHT ARM  Final   Special Requests  Final    BOTTLES DRAWN AEROBIC AND ANAEROBIC Blood Culture adequate volume   Culture   Final    NO GROWTH 2 DAYS Performed at [facility] [place] [facility], [address] [place], [place] [address]    Report Status PENDING  Incomplete     Labs: BNP (last 3 results) No results for input(s): "BNP" in the last 8760 hours. Basic Metabolic Panel: Recent Labs  Lab 06/13/23 1300 06/14/23 0515  NA 141 141  K 3.8 3.9  CL 107 106  CO2 23 23  GLUCOSE 109* 100*  BUN 19 14  CREATININE 0.56 0.48  CALCIUM 8.7* 9.2  MG  --  1.6*  PHOS  --  2.6   Liver Function Tests: Recent Labs  Lab 06/13/23 1300  AST 21  ALT 12  ALKPHOS 82  BILITOT 0.3  PROT 7.2  ALBUMIN 2.5*   No results for input(s): "LIPASE", "AMYLASE" in the last 168 hours. No results for input(s): "AMMONIA" in the last 168 hours. CBC: Recent Labs  Lab 06/13/23 1300 06/14/23 0515  WBC 6.7 6.2  NEUTROABS 4.0  --   HGB 10.3* 10.9*  HCT 35.8* 37.0  MCV 86.7 84.5  PLT 245 262   Cardiac Enzymes: No results for input(s): "CKTOTAL", "CKMB", "CKMBINDEX", "TROPONINI" in the last 168 hours. BNP: Invalid input(s): "POCBNP" CBG: No results for input(s): "GLUCAP" in the last 168 hours. D-Dimer No results for input(s): "DDIMER" in the last 72 hours. Hgb A1c No results for input(s): "HGBA1C" in the last 72 hours. Lipid Profile No results for input(s): "CHOL", "HDL", "LDLCALC", "TRIG", "CHOLHDL", "LDLDIRECT" in the last 72 hours. Thyroid function studies No results for input(s): "TSH",  "T4TOTAL", "T3FREE", "THYROIDAB" in the last 72 hours.  Invalid input(s): "FREET3" Anemia work up No results for input(s): "VITAMINB12", "FOLATE", "FERRITIN", "TIBC", "IRON", "RETICCTPCT" in the last 72 hours. Urinalysis    Component Value Date/Time   COLORURINE STRAW (A) 06/13/2023 1714   APPEARANCEUR CLEAR 06/13/2023 1714   LABSPEC 1.012 06/13/2023 1714   PHURINE 5.0 06/13/2023 1714   GLUCOSEU NEGATIVE 06/13/2023 1714   HGBUR NEGATIVE 06/13/2023 1714   BILIRUBINUR NEGATIVE 06/13/2023 1714   KETONESUR NEGATIVE 06/13/2023 1714   PROTEINUR NEGATIVE 06/13/2023 1714   UROBILINOGEN 1.0 09/11/2010 1052   NITRITE NEGATIVE 06/13/2023 1714   LEUKOCYTESUR MODERATE (A) 06/13/2023 1714   Sepsis Labs Recent Labs  Lab 06/13/23 1300 06/14/23 0515  WBC 6.7 6.2   Microbiology Recent Results (from the past 240 hours)  Blood Culture (routine x 2)     Status: Abnormal   Collection Time: 06/13/23  1:00 PM   Specimen: BLOOD  Result Value Ref Range Status   Specimen Description BLOOD RIGHT ANTECUBITAL  Final   Special Requests   Final    BOTTLES DRAWN AEROBIC AND ANAEROBIC Blood Culture adequate volume   Culture  Setup Time   Final    GRAM POSITIVE RODS ANAEROBIC BOTTLE ONLY CRITICAL RESULT CALLED TO, READ BACK BY AND VERIFIED WITH: PHARMD G ABBOTT 06/14/2023 @ 0354 BY AB GRAM POSITIVE COCCI AEROBIC BOTTLE ONLY CRITICAL RESULT CALLED TO, READ BACK BY AND VERIFIED WITH: PHARMD THUY DANG [phone] AT 1116 BY EC    Culture (A)  Final    STAPHYLOCOCCUS EPIDERMIDIS THE SIGNIFICANCE OF ISOLATING THIS ORGANISM FROM A SINGLE SET OF BLOOD CULTURES WHEN MULTIPLE SETS ARE DRAWN IS UNCERTAIN. PLEASE NOTIFY THE MICROBIOLOGY DEPARTMENT WITHIN ONE WEEK IF SPECIATION AND SENSITIVITIES ARE REQUIRED. CLOSTRIDIUM PERFRINGENS Standardized susceptibility testing for this organism is not  available. Performed at [facility], [address] [place], [place] [address]    Report Status 06/16/2023 FINAL   Final  Blood Culture ID Panel (Reflexed)     Status: Abnormal   Collection Time: 06/13/23  1:00 PM  Result Value Ref Range Status   Enterococcus faecalis NOT DETECTED NOT DETECTED Final   Enterococcus Faecium NOT DETECTED NOT DETECTED Final   Listeria monocytogenes NOT DETECTED NOT DETECTED Final   Staphylococcus species DETECTED (A) NOT DETECTED Final    Comment: CRITICAL RESULT CALLED TO, READ BACK BY AND VERIFIED WITH: PHARMD THUY DANG [phone] AT 1116 BY EC    Staphylococcus aureus (BCID) NOT DETECTED NOT DETECTED Final   Staphylococcus epidermidis DETECTED (A) NOT DETECTED Final    Comment: Methicillin (oxacillin) resistant coagulase negative staphylococcus. Possible blood culture contaminant (unless isolated from more than one blood culture draw or clinical case suggests pathogenicity). No antibiotic treatment is indicated for blood  culture contaminants. CRITICAL RESULT CALLED TO, READ BACK BY AND VERIFIED WITH: PHARMD THUY DANG [phone] AT 1116 BY EC    Staphylococcus lugdunensis NOT DETECTED NOT DETECTED Final   Streptococcus species NOT DETECTED NOT DETECTED Final   Streptococcus agalactiae NOT DETECTED NOT DETECTED Final   Streptococcus pneumoniae NOT DETECTED NOT DETECTED Final   Streptococcus pyogenes NOT DETECTED NOT DETECTED Final   A.calcoaceticus-baumannii NOT DETECTED NOT DETECTED Final   Bacteroides fragilis NOT DETECTED NOT DETECTED Final   Enterobacterales NOT DETECTED NOT DETECTED Final   Enterobacter cloacae complex NOT DETECTED NOT DETECTED Final   Escherichia coli NOT DETECTED NOT DETECTED Final   Klebsiella aerogenes NOT DETECTED NOT DETECTED Final   Klebsiella oxytoca NOT DETECTED NOT DETECTED Final   Klebsiella pneumoniae NOT DETECTED NOT DETECTED Final   Proteus species NOT DETECTED NOT DETECTED Final   Salmonella species NOT DETECTED NOT DETECTED Final   Serratia marcescens NOT DETECTED NOT DETECTED Final   Haemophilus influenzae NOT DETECTED NOT  DETECTED Final   Neisseria meningitidis NOT DETECTED NOT DETECTED Final   Pseudomonas aeruginosa NOT DETECTED NOT DETECTED Final   Stenotrophomonas maltophilia NOT DETECTED NOT DETECTED Final   Candida albicans NOT DETECTED NOT DETECTED Final   Candida auris NOT DETECTED NOT DETECTED Final   Candida glabrata NOT DETECTED NOT DETECTED Final   Candida krusei NOT DETECTED NOT DETECTED Final   Candida parapsilosis NOT DETECTED NOT DETECTED Final   Candida tropicalis NOT DETECTED NOT DETECTED Final   Cryptococcus neoformans/gattii NOT DETECTED NOT DETECTED Final   Methicillin resistance mecA/C DETECTED (A) NOT DETECTED Final    Comment: CRITICAL RESULT CALLED TO, READ BACK BY AND VERIFIED WITH: PHARMD Phillips Climes [id number] AT 1116 BY EC Performed at [facility], [address] [place], [place] [address]   Blood Culture (routine x 2)     Status: None (Preliminary result)   Collection Time: 06/13/23  1:03 PM   Specimen: BLOOD  Result Value Ref Range Status   Specimen Description BLOOD LEFT ANTECUBITAL  Final   Special Requests   Final    BOTTLES DRAWN AEROBIC AND ANAEROBIC Blood Culture adequate volume   Culture   Final    NO GROWTH 3 DAYS Performed at [facility], [address] [place], [place] [address]    Report Status PENDING  Incomplete  Urine Culture     Status: Abnormal (Preliminary result)   Collection Time: 06/13/23  5:14 PM   Specimen: Urine, Clean Catch  Result Value Ref Range Status  Specimen Description URINE, CLEAN CATCH  Final   Special Requests NONE Reflexed from [id number]  Final   Culture (A)  Final    >=100,000 COLONIES/mL PSEUDOMONAS AERUGINOSA SUSCEPTIBILITIES TO FOLLOW Performed at [facility], [address], [place], [place] [address]    Report Status PENDING  Incomplete  MRSA Next Gen by PCR, Nasal     Status: None   Collection Time: 06/13/23  8:47 PM   Specimen: Nasal Mucosa; Nasal Swab  Result Value Ref Range Status   MRSA by PCR Next  Gen NOT DETECTED NOT DETECTED Final    Comment: (NOTE) The GeneXpert MRSA Assay (FDA approved for NASAL specimens only), is one component of a comprehensive MRSA colonization surveillance program. It is not intended to diagnose MRSA infection nor to guide or monitor treatment for MRSA infections. Test performance is not FDA approved in patients less than 59 years old. Performed at [facility], [address] [place], [place] [address]   Culture, blood (Routine X 2) w Reflex to ID Panel     Status: None (Preliminary result)   Collection Time: 06/14/23  3:50 PM   Specimen: BLOOD LEFT ARM  Result Value Ref Range Status   Specimen Description BLOOD LEFT ARM  Final   Special Requests   Final    BOTTLES DRAWN AEROBIC AND ANAEROBIC Blood Culture adequate volume   Culture   Final    NO GROWTH 2 DAYS Performed at [facility], [address] [place], [place] [address]    Report Status PENDING  Incomplete  Culture, blood (Routine X 2) w Reflex to ID Panel     Status: None (Preliminary result)   Collection Time: 06/14/23  3:51 PM   Specimen: BLOOD RIGHT ARM  Result Value Ref Range Status   Specimen Description BLOOD RIGHT ARM  Final   Special Requests   Final    BOTTLES DRAWN AEROBIC AND ANAEROBIC Blood Culture adequate volume   Culture   Final    NO GROWTH 2 DAYS Performed at [facility], [address] [place], [place] [address]    Report Status PENDING  Incomplete     Time coordinating discharge: Over 30 minutes  SIGNED:   Azucena Fallen, DO [facility] 06/16/2023, 2:44 PM Pager   If 7PM-7AM, please contact night-coverage [email or web address]

## 2023-06-16 NOTE — Consult Note (Signed)
 [facility]  Reason for Consult: Left lower extremity wound Referring Physician: Dr. Venita Lick  [name] is an 53 y.o. female.  HPI: 53 year old female past medical history significant for MS, bedbound, osteomyelitis of her sacrum, osteomyelitis of vertebra, sacral and sacral coccygeal region with a known stage IV pressure ulcer on the right buttock and left buttock which is managed by outpatient wound care.  Patient presented to ED on 06/13/2023 with AMS.  Patient admitted by hospitalist medicine for AMS, UTI treatment and for further management of exposed hardware.  Patient has left ankle wound with hardware exposed over the left lateral malleolus.  Surgery consulted for assistance with closure/wound management.  Today, patient reports she is doing okay.  She states that she thinks she might be discharged soon.  Past Medical History:  Diagnosis Date   Arthritis    MS (multiple sclerosis) (HCC)     Past Surgical History:  Procedure Laterality Date   NO PAST SURGERIES     ORIF ANKLE FRACTURE Left 03/30/2013   Procedure: OPEN REDUCTION INTERNAL FIXATION (ORIF) LEFT ANKLE FRACTURE;  Surgeon: Toni Arthurs, MD;  Location: [facility] OR;  Service: Orthopedics;  Laterality: Left;    History reviewed. No pertinent family history.  Social History:  reports that she quit smoking about 14 years ago. Her smoking use included cigarettes. She started smoking about 34 years ago. She has a 40 pack-year smoking history. She quit smokeless tobacco use about 4 years ago. She reports that she does not drink alcohol and does not use drugs.  Allergies:  Allergies  Allergen Reactions   Silver Other (See Comments)    Irritation    Tizanidine Diarrhea   Tomato Rash    Medications: I have reviewed the patient's current medications.  No results found for this or any previous visit (from the past 48 hours).   No results found.  ROS Does not report any infectious symptoms   Blood pressure 132/84, pulse (!) 107, temperature 98.4 F (36.9 C), resp. rate 17, height 5\' 2"  (1.575 m), weight 113.4 kg, SpO2 98%. Physical Exam -General: no acute distress, alert -Respiratory: unlabored breathing, no acute distress -LLE: Reviewed photos in the chart. Approximately a 1 cm x 1 cm ulcer noted to the lateral ankle with purulent material versus exudate throughout.   Assessment/Plan:   Plan for irrigation and debridement of left lower extremity wound with placement of myriad wound matrix tomorrow with Dr. Ulice Bold.  N.p.o. after midnight tonight.  Discussed case with Dr. Ulice Bold   Also discussed plans with patient's parents via telephone and they were in agreement with the plan.     Laurena Spies, PA-C 06/16/2023, 4:36 PM

## 2023-06-16 NOTE — Progress Notes (Signed)
 Surgical scheduling orders

## 2023-06-16 NOTE — Care Management Important Message (Signed)
 Important Message  Patient Details  Name: [name] MRN: [id number] Date of Birth: [birth year]   Important Message Given:  Yes - Medicare IM     Dorena Bodo 06/16/2023, 3:26 PM

## 2023-06-16 NOTE — Plan of Care (Signed)

## 2023-06-16 NOTE — Progress Notes (Addendum)
    Subjective: [name] is a 53 y.o. female with medical history significant for MS, bedbound, osteomyelitis of the sacrum, osteomyelitis of vertebra, sacral, and sacrococcygeal region, known stage IV pressure ulcer right buttock and left buttock followed by wound care in the outpatient setting, noted recurrent history of UTI previously with Pseudomonas Citrobacter and E. coli.   Patient presents from home with acute metabolic encephalopathy concerning for possible infection.  ED workup without overt findings, patient did not meet sepsis criteria and imaging was unremarkable for acute findings at intake.  UA without clear infection, cultures pending.  Hospitalist called for admission.  Orthopedics called in consult over left lateral malleolus pressure ulcer near prior hardware placement.  Objective: Vital signs in last 24 hours: Temp:  [98.3 F (36.8 C)-98.9 F (37.2 C)] 98.3 F (36.8 C) (04/07 0615) Pulse Rate:  [95-110] 95 (04/07 0615) Resp:  [16-24] 16 (04/07 0615) BP: (127-140)/(74-83) 140/82 (04/07 0615) SpO2:  [99 %-100 %] 100 % (04/07 0615)  Intake/Output from previous day: 04/06 0701 - 04/07 0700 In: 600 [P.O.:600] Out: 2150 [Urine:2150] Intake/Output this shift: No intake/output data recorded.  Labs: Recent Labs    06/13/23 1300 06/14/23 0515  HGB 10.3* 10.9*   Recent Labs    06/13/23 1300 06/14/23 0515  WBC 6.7 6.2  RBC 4.13 4.38  HCT 35.8* 37.0  PLT 245 262   Recent Labs    06/13/23 1300 06/14/23 0515  NA 141 141  K 3.8 3.9  CL 107 106  CO2 23 23  BUN 19 14  CREATININE 0.56 0.48  GLUCOSE 109* 100*  CALCIUM 8.7* 9.2   Recent Labs    06/13/23 1300  INR 1.1   Body mass index is 45.73 kg/m.  Physical Exam:  Lower extremities splayed out in a "frog" position with lateral aspect of both legs and ankles in dependent positions.    Generalized swelling and dependent edema noted over the lateral aspect of the leg and ankle on the left.   Approximate 1 cm full-thickness skin ulceration over the apex of the fibula.  Hardware not prominent currently.  No purulent drainage.   Recent plain films of the left ankle are reviewed which show stable hardware and evidence for complete and solid bony union from previous bimalleolar fracture.  No obvious evidence for bony erosion or changes suggestive of osteomyelitis.  Diffuse osteopenia noted as would be expected given the patient's nonambulatory status.    Assessment/Plan: No change in clinical exam.   Agree with WOC recommendations for gluteus ulceration.  If further treatment is required recommend consulting general surgery. With respect to ankle ulceration: Dr Ulice Bold (plastic surgery) will eval today to determine if surgical debridement and wound coverage is needed No further recommendations from Ortho stand point.  Will sign off - please call if question/concerns arise.  Alvy Beal for Dr. Venita Lick [facility] [phone] 06/16/2023, 7:59 AM     Addendum dictation: Dr. Ulice Bold plans on taking the patient tomorrow afternoon for surgical debridement of the left lateral ankle ulceration.  She was requested my presence in order to facilitate removal of the hardware.  I have discussed this with the patient.  I did indicate the potential risks including fracture, need for additional surgery, infection, bleeding, nerve damage, and risks of general anesthesia including but not limited to death stroke or paralysis.  The patient indicated she understood these risks.

## 2023-06-17 ENCOUNTER — Encounter (HOSPITAL_COMMUNITY): Payer: Self-pay | Admitting: Internal Medicine

## 2023-06-17 ENCOUNTER — Other Ambulatory Visit: Payer: Self-pay

## 2023-06-17 ENCOUNTER — Inpatient Hospital Stay (HOSPITAL_COMMUNITY): Admitting: Anesthesiology

## 2023-06-17 ENCOUNTER — Encounter (HOSPITAL_COMMUNITY)
Admission: EM | Disposition: A | Discharge status: Home Health | Payer: Self-pay | Source: Home / Self Care | Attending: Internal Medicine

## 2023-06-17 DIAGNOSIS — I10 Essential (primary) hypertension: Secondary | ICD-10-CM | POA: Diagnosis not present

## 2023-06-17 DIAGNOSIS — S81802A Unspecified open wound, left lower leg, initial encounter: Secondary | ICD-10-CM | POA: Diagnosis not present

## 2023-06-17 DIAGNOSIS — S91002A Unspecified open wound, left ankle, initial encounter: Secondary | ICD-10-CM

## 2023-06-17 DIAGNOSIS — R410 Disorientation, unspecified: Secondary | ICD-10-CM | POA: Diagnosis not present

## 2023-06-17 HISTORY — PX: INCISION AND DRAINAGE OF WOUND: SHX1803

## 2023-06-17 HISTORY — PX: HARDWARE REMOVAL: SHX979

## 2023-06-17 LAB — URINE CULTURE: Culture: >=100000 — AB

## 2023-06-17 SURGERY — IRRIGATION AND DEBRIDEMENT WOUND
Anesthesia: General | Site: Leg Lower | Laterality: Left

## 2023-06-17 MED ORDER — MIDAZOLAM HCL 2 MG/2ML IJ SOLN
INTRAMUSCULAR | Status: AC
Start: 2023-06-17 — End: ?
  Filled 2023-06-17: qty 2

## 2023-06-17 MED ORDER — LIDOCAINE 2% (20 MG/ML) 5 ML SYRINGE
INTRAMUSCULAR | Status: DC | PRN
  Administered 2023-06-17: 40 mg via INTRAVENOUS

## 2023-06-17 MED ORDER — ACETAMINOPHEN 10 MG/ML IV SOLN
INTRAVENOUS | Status: AC
  Filled 2023-06-17: qty 100

## 2023-06-17 MED ORDER — DEXAMETHASONE SODIUM PHOSPHATE 10 MG/ML IJ SOLN
INTRAMUSCULAR | Status: DC | PRN
Start: 2023-06-17 — End: 2023-06-17
  Administered 2023-06-17: 5 mg via INTRAVENOUS

## 2023-06-17 MED ORDER — FENTANYL CITRATE (PF) 250 MCG/5ML IJ SOLN
INTRAMUSCULAR | Status: AC
  Filled 2023-06-17: qty 5

## 2023-06-17 MED ORDER — OXYCODONE HCL 5 MG/5ML PO SOLN: 5.0000 mg | Freq: Once | ORAL | Status: DC | PRN

## 2023-06-17 MED ORDER — OXYCODONE HCL 5 MG PO TABS: 5.0000 mg | ORAL_TABLET | Freq: Once | ORAL | Status: DC | PRN

## 2023-06-17 MED ORDER — PHENYLEPHRINE HCL-NACL 20-0.9 MG/250ML-% IV SOLN
INTRAVENOUS | Status: DC | PRN
Start: 2023-06-17 — End: 2023-06-17
  Administered 2023-06-17: 50 ug/min via INTRAVENOUS

## 2023-06-17 MED ORDER — BACLOFEN 10 MG PO TABS
10.0000 mg | ORAL_TABLET | Freq: Once | ORAL | Status: AC
  Administered 2023-06-17: 10 mg via ORAL
  Filled 2023-06-17: qty 1

## 2023-06-17 MED ORDER — ORAL CARE MOUTH RINSE: 15.0000 mL | Freq: Once | OROMUCOSAL | Status: AC

## 2023-06-17 MED ORDER — PROPOFOL 10 MG/ML IV BOLUS
INTRAVENOUS | Status: AC
  Filled 2023-06-17: qty 20

## 2023-06-17 MED ORDER — LIDOCAINE-EPINEPHRINE 1 %-1:100000 IJ SOLN
INTRAMUSCULAR | Status: AC
  Filled 2023-06-17: qty 1

## 2023-06-17 MED ORDER — PHENYLEPHRINE 80 MCG/ML (10ML) SYRINGE FOR IV PUSH (FOR BLOOD PRESSURE SUPPORT)
PREFILLED_SYRINGE | INTRAVENOUS | Status: DC | PRN
  Administered 2023-06-17: 160 ug via INTRAVENOUS

## 2023-06-17 MED ORDER — BUPIVACAINE-EPINEPHRINE (PF) 0.25% -1:200000 IJ SOLN
INTRAMUSCULAR | Status: AC
  Filled 2023-06-17: qty 30

## 2023-06-17 MED ORDER — DROPERIDOL 2.5 MG/ML IJ SOLN: 0.6250 mg | Freq: Once | INTRAMUSCULAR | Status: DC | PRN

## 2023-06-17 MED ORDER — LACTATED RINGERS IV SOLN: INTRAVENOUS | Status: DC

## 2023-06-17 MED ORDER — VASHE WOUND IRRIGATION OPTIME
TOPICAL | Status: DC | PRN
  Administered 2023-06-17: 34 [oz_av]

## 2023-06-17 MED ORDER — FENTANYL CITRATE (PF) 100 MCG/2ML IJ SOLN
25.0000 ug | INTRAMUSCULAR | Status: DC | PRN
  Administered 2023-06-17: 50 ug via INTRAVENOUS

## 2023-06-17 MED ORDER — ACETAMINOPHEN 500 MG PO TABS: 1000.0000 mg | ORAL_TABLET | Freq: Once | ORAL | Status: DC

## 2023-06-17 MED ORDER — EPHEDRINE SULFATE-NACL 50-0.9 MG/10ML-% IV SOSY
PREFILLED_SYRINGE | INTRAVENOUS | Status: DC | PRN
Start: 2023-06-17 — End: 2023-06-17
  Administered 2023-06-17: 5 mg via INTRAVENOUS

## 2023-06-17 MED ORDER — ACETAMINOPHEN 10 MG/ML IV SOLN
INTRAVENOUS | Status: DC | PRN
  Administered 2023-06-17: 1000 mg via INTRAVENOUS

## 2023-06-17 MED ORDER — ONDANSETRON HCL 4 MG/2ML IJ SOLN
INTRAMUSCULAR | Status: DC | PRN
Start: 2023-06-17 — End: 2023-06-17
  Administered 2023-06-17: 4 mg via INTRAVENOUS

## 2023-06-17 MED ORDER — CHLORHEXIDINE GLUCONATE 0.12 % MT SOLN
OROMUCOSAL | Status: AC
  Administered 2023-06-17: 15 mL via OROMUCOSAL
  Filled 2023-06-17: qty 15

## 2023-06-17 MED ORDER — CHLORHEXIDINE GLUCONATE CLOTH 2 % EX PADS
6.0000 | MEDICATED_PAD | Freq: Once | CUTANEOUS | Status: AC
  Administered 2023-06-17: 6 via TOPICAL

## 2023-06-17 MED ORDER — PROPOFOL 10 MG/ML IV BOLUS
INTRAVENOUS | Status: DC | PRN
  Administered 2023-06-17: 150 mg via INTRAVENOUS

## 2023-06-17 MED ORDER — FENTANYL CITRATE (PF) 100 MCG/2ML IJ SOLN
INTRAMUSCULAR | Status: AC
  Administered 2023-06-17: 50 ug via INTRAVENOUS
  Filled 2023-06-17: qty 2

## 2023-06-17 MED ORDER — TRAMADOL HCL 50 MG PO TABS
50.0000 mg | ORAL_TABLET | Freq: Once | ORAL | Status: AC
  Administered 2023-06-17: 50 mg via ORAL
  Filled 2023-06-17: qty 1

## 2023-06-17 MED ORDER — FENTANYL CITRATE (PF) 250 MCG/5ML IJ SOLN
INTRAMUSCULAR | Status: DC | PRN
  Administered 2023-06-17: 50 ug via INTRAVENOUS

## 2023-06-17 MED ORDER — CHLORHEXIDINE GLUCONATE 0.12 % MT SOLN: 15.0000 mL | Freq: Once | OROMUCOSAL | Status: AC

## 2023-06-17 MED ORDER — 0.9 % SODIUM CHLORIDE (POUR BTL) OPTIME
TOPICAL | Status: DC | PRN
  Administered 2023-06-17: 1000 mL

## 2023-06-17 MED ORDER — CEFAZOLIN SODIUM-DEXTROSE 2-4 GM/100ML-% IV SOLN
2.0000 g | INTRAVENOUS | Status: AC
  Administered 2023-06-17: 2 g via INTRAVENOUS
  Filled 2023-06-17: qty 100

## 2023-06-17 MED ORDER — BUPIVACAINE-EPINEPHRINE 0.25% -1:200000 IJ SOLN
INTRAMUSCULAR | Status: DC | PRN
  Administered 2023-06-17: 9 mL

## 2023-06-17 SURGICAL SUPPLY — 55 items
APPLICATOR COTTON TIP 6 STRL (MISCELLANEOUS) IMPLANT
APPLICATOR COTTON TIP 6IN STRL (MISCELLANEOUS) IMPLANT
BAG COUNTER SPONGE SURGICOUNT (BAG) ×2 IMPLANT
BAG DECANTER FOR FLEXI CONT (MISCELLANEOUS) IMPLANT
BENZOIN TINCTURE PRP APPL 2/3 (GAUZE/BANDAGES/DRESSINGS) ×2 IMPLANT
BNDG ELASTIC 4X5.8 VLCR NS LF (GAUZE/BANDAGES/DRESSINGS) IMPLANT
CANISTER SUCT 3000ML PPV (MISCELLANEOUS) ×2 IMPLANT
CNTNR URN SCR LID CUP LEK RST (MISCELLANEOUS) IMPLANT
COVER SURGICAL LIGHT HANDLE (MISCELLANEOUS) ×2 IMPLANT
DRAIN CHANNEL 19F RND (DRAIN) IMPLANT
DRAIN JP 10F RND SILICONE (MISCELLANEOUS) IMPLANT
DRAPE HALF SHEET 40X57 (DRAPES) IMPLANT
DRAPE IMP U-DRAPE 54X76 (DRAPES) ×2 IMPLANT
DRAPE INCISE IOBAN 66X45 STRL (DRAPES) IMPLANT
DRAPE LAPAROSCOPIC ABDOMINAL (DRAPES) IMPLANT
DRAPE LAPAROTOMY 100X72 PEDS (DRAPES) ×2 IMPLANT
DRSG ADAPTIC 3X8 NADH LF (GAUZE/BANDAGES/DRESSINGS) IMPLANT
DRSG DERMACEA NONADH 3X8 (GAUZE/BANDAGES/DRESSINGS) IMPLANT
DRSG MEPILEX POST OP 4X8 (GAUZE/BANDAGES/DRESSINGS) IMPLANT
DRSG VAC GRANUFOAM LG (GAUZE/BANDAGES/DRESSINGS) IMPLANT
DRSG VAC GRANUFOAM MED (GAUZE/BANDAGES/DRESSINGS) IMPLANT
DRSG VAC GRANUFOAM SM (GAUZE/BANDAGES/DRESSINGS) IMPLANT
ELECT CAUTERY BLADE 6.4 (BLADE) IMPLANT
ELECT REM PT RETURN 9FT ADLT (ELECTROSURGICAL) ×2 IMPLANT
ELECTRODE REM PT RTRN 9FT ADLT (ELECTROSURGICAL) ×2 IMPLANT
GAUZE PAD ABD 8X10 STRL (GAUZE/BANDAGES/DRESSINGS) IMPLANT
GAUZE SPONGE 4X4 12PLY STRL (GAUZE/BANDAGES/DRESSINGS) IMPLANT
GLOVE BIO SURGEON STRL SZ 6.5 (GLOVE) ×2 IMPLANT
GLOVE BIOGEL M 6.5 STRL (GLOVE) ×2 IMPLANT
GOWN STRL REUS W/ TWL LRG LVL3 (GOWN DISPOSABLE) ×6 IMPLANT
GRAFT MYRIAD 3 LAYER 5X5 (Graft) IMPLANT
KIT BASIN OR (CUSTOM PROCEDURE TRAY) ×2 IMPLANT
KIT TURNOVER KIT B (KITS) ×2 IMPLANT
NDL HYPO 25GX1X1/2 BEV (NEEDLE) ×2 IMPLANT
NEEDLE HYPO 25GX1X1/2 BEV (NEEDLE) ×2 IMPLANT
NS IRRIG 1000ML POUR BTL (IV SOLUTION) ×2 IMPLANT
PACK GENERAL/GYN (CUSTOM PROCEDURE TRAY) ×2 IMPLANT
PACK UNIVERSAL I (CUSTOM PROCEDURE TRAY) ×2 IMPLANT
PAD ARMBOARD POSITIONER FOAM (MISCELLANEOUS) ×4 IMPLANT
POWDER MYRIAD MORCELLS 500MG (Miscellaneous) IMPLANT
STAPLER VISISTAT 35W (STAPLE) ×2 IMPLANT
SURGILUBE 2OZ TUBE FLIPTOP (MISCELLANEOUS) IMPLANT
SUT MNCRL AB 3-0 PS2 18 (SUTURE) IMPLANT
SUT MNCRL AB 3-0 PS2 27 (SUTURE) IMPLANT
SUT MNCRL AB 4-0 PS2 18 (SUTURE) IMPLANT
SUT MON AB 2-0 CT1 36 (SUTURE) IMPLANT
SUT MON AB 5-0 PS2 18 (SUTURE) IMPLANT
SUT PDS AB 3-0 SH 27 (SUTURE) IMPLANT
SUT VIC AB 5-0 PS2 18 (SUTURE) IMPLANT
SUT VICRYL 3 0 (SUTURE) IMPLANT
SWAB COLLECTION DEVICE MRSA (MISCELLANEOUS) IMPLANT
SWAB CULTURE ESWAB REG 1ML (MISCELLANEOUS) IMPLANT
SYR CONTROL 10ML LL (SYRINGE) ×2 IMPLANT
TOWEL GREEN STERILE (TOWEL DISPOSABLE) ×2 IMPLANT
UNDERPAD 30X36 HEAVY ABSORB (UNDERPADS AND DIAPERS) ×2 IMPLANT

## 2023-06-17 NOTE — Plan of Care (Signed)

## 2023-06-17 NOTE — Transfer of Care (Signed)
 Immediate Anesthesia Transfer of Care Note  Patient: [name]  Procedure(s) Performed: IRRIGATION AND DEBRIDEMENT WOUND (Left: Leg Lower) APPLICATION, SKIN SUBSTITUTE (Left: Leg Lower) REMOVAL, HARDWARE LEFT ANKLE (Left)  Patient Location: PACU  Anesthesia Type:General  Level of Consciousness: drowsy and responds to stimulation  Airway & Oxygen Therapy: Patient Spontanous Breathing  Post-op Assessment: Report given to RN and Post -op Vital signs reviewed and stable  Post vital signs: Reviewed and stable  Last Vitals:  Vitals Value Taken Time  BP 103/45 06/17/23 1633  Temp    Pulse 96 06/17/23 1635  Resp 12 06/17/23 1635  SpO2 100 % 06/17/23 1635  Vitals shown include unfiled device data.  Last Pain:  Vitals:   06/17/23 1300  TempSrc: Oral  PainSc:          Complications: No notable events documented.

## 2023-06-17 NOTE — Interval H&P Note (Signed)
 History and Physical Interval Note:  06/17/2023 3:15 PM  [name]  has presented today for surgery, with the diagnosis of Wound of left lower extremity.  The various methods of treatment have been discussed with the patient and family. After consideration of risks, benefits and other options for treatment, the patient has consented to  Procedure(s) with comments: IRRIGATION AND DEBRIDEMENT WOUND (Left) - irrigation and debridement of left lower extremity wound with placement of myriad APPLICATION, SKIN SUBSTITUTE (Left) REMOVAL, HARDWARE LEFT ANKLE (Left) as a surgical intervention.  The patient's history has been reviewed, patient examined, no change in status, stable for surgery.  I have reviewed the patient's chart and labs.  Questions were answered to the patient's satisfaction.     Alena Bills Eliezer Khawaja

## 2023-06-17 NOTE — Plan of Care (Signed)
   Problem: Coping: Goal: Level of anxiety will decrease Outcome: Progressing

## 2023-06-17 NOTE — Anesthesia Procedure Notes (Signed)
 Procedure Name: LMA Insertion Date/Time: 06/17/2023 3:45 PM  Performed by: Loleta Letanya Froh, CRNAPre-anesthesia Checklist: Patient identified, Patient being monitored, Timeout performed, Emergency Drugs available and Suction available Patient Re-evaluated:Patient Re-evaluated prior to induction Oxygen Delivery Method: Circle system utilized Preoxygenation: Pre-oxygenation with 100% oxygen Induction Type: IV induction Ventilation: Mask ventilation without difficulty LMA: LMA inserted LMA Size: 4.0 Tube type: Oral Number of attempts: 1 Placement Confirmation: positive ETCO2 and breath sounds checked- equal and bilateral Tube secured with: Tape Dental Injury: Teeth and Oropharynx as per pre-operative assessment

## 2023-06-17 NOTE — Brief Op Note (Signed)
 06/13/2023 - 06/17/2023  4:13 PM  PATIENT:  [name]  53 y.o. female  PRE-OPERATIVE DIAGNOSIS:  Wound of left lower extremity  POST-OPERATIVE DIAGNOSIS:  Wound of left lower extremity  PROCEDURE:  Procedure(s) with comments: IRRIGATION AND DEBRIDEMENT WOUND (Left) - irrigation and debridement of left lower extremity wound with placement of myriad APPLICATION, SKIN SUBSTITUTE (Left) REMOVAL, HARDWARE LEFT ANKLE (Left)  SURGEON:  Surgeons and Role: Panel 1:    * Dillingham, Alena Bills, DO - Primary Panel 2:    Venita Lick, MD - Primary   ANESTHESIA:   general  EBL: See anesthesia report  BLOOD ADMINISTERED:none  DRAINS: See plastic surgery report   LOCAL MEDICATIONS USED:  NONE  SPECIMEN:  No Specimen  DISPOSITION OF SPECIMEN:  N/A  COUNTS:  See plastic surgery report   TOURNIQUET:  * No tourniquets in log *  DICTATION: .Dragon Dictation  PLAN OF CARE: Admit to inpatient   PATIENT DISPOSITION:   See plastic surgery report

## 2023-06-17 NOTE — Progress Notes (Deleted)
 PROGRESS NOTE    [name]  YQI:[id number] DOB: September 05, 1970 DOA: 06/13/2023 PCP: [facility]   Brief Narrative:  [name] is a 53 y.o. female with medical history significant for MS, bedbound, osteomyelitis of the sacrum, osteomyelitis of vertebra, sacral, and sacrococcygeal region, known stage IV pressure ulcer right buttock and left buttock followed by wound care in the outpatient setting, noted recurrent history of UTI previously with Pseudomonas Citrobacter and E. coli.  Patient presents from home with acute metabolic encephalopathy concerning for possible infection.  ED workup without overt findings, patient did not meet sepsis criteria and imaging was unremarkable for acute findings at intake.  UA without clear infection, cultures pending.  Hospitalist called for admission.  Orthopedics called in consult over left lateral malleolus pressure ulcer near prior hardware placement.  Assessment & Plan:   Principal Problem:   AMS (altered mental status)   Rule out UTI, POA Rule out pressure wounds infection, POA. Acute encephalopathy, secondary to UTI, resolved -Patient does not meet sepsis criteria  -Ortho/Plastics consulted in regards to left lateral malleolus pressure wound near prior hardware, appreciate insight recommendations -Completed antibiotics for UTI - continue antibiotics per surgery teams for wound coverage.   Multiple sclerosis Bedbound status at baseline, continue frequent turns Local wound care ongoing, see below   Multiple pressure wounds Intractable pain -Follows outpatient wound care, wound care consulted while inpatient, continue per their guidance -Wean narcotics aggressively in the setting of encephalopathy -Patient states her home regimen including meloxicam covers her daily pain quite well   Anemia of chronic disease in the setting of chronic pressure wounds Stable at baseline   Hypoalbuminemia Albumin 2.5 Discussed improved diet  as below  Obesity BMI 45 Recommend weight loss outpatient with regular physical activity as tolerated and healthy dieting.  Pressure injury, multiple, POA Pressure Injury 06/15/23 Buttocks Right;Upper Stage 4 - Full thickness tissue loss with exposed bone, tendon or muscle. (Active)  06/15/23 1700  Location: Buttocks  Location Orientation: Right;Upper  Staging: Stage 4 - Full thickness tissue loss with exposed bone, tendon or muscle.  Wound Description (Comments):   Present on Admission: Yes      DVT prophylaxis: SCD's Start: 06/17/23 0805 heparin injection 5,000 Units Start: 06/13/23 2200 Code Status:   Code Status: Full Code Family Communication: None present  Status is: Inpatient  Dispo: The patient is from: Home              Anticipated d/c is to: To be determined              Anticipated d/c date is: 48-72 hours              Patient currently not medically stable for discharge  Consultants:  Orthopedic surgery  Procedures:  None  Antimicrobials:  Cefepime  Subjective: No acute issues or events overnight, denies nausea vomiting diarrhea constipation headache fevers chills or chest pain  Objective: Vitals:   06/16/23 1552 06/16/23 2107 06/17/23 0459 06/17/23 1300  BP: 132/84 122/78 114/81 121/77  Pulse: (!) 107 (!) 108 99 99  Resp: 17 16 16 17   Temp: 98.4 F (36.9 C) 99.7 F (37.6 C) 98.3 F (36.8 C) 98.2 F (36.8 C)  TempSrc:  Oral Oral Oral  SpO2: 98% 99% 100% 100%  Weight:      Height:        Intake/Output Summary (Last 24 hours) at 06/17/2023 1448 Last data filed at 06/17/2023 1300 Gross per 24 hour  Intake 800 ml  Output 1800 ml  Net -1000 ml   Filed Weights   06/13/23 1300  Weight: 113.4 kg    Examination:  General:  Pleasantly resting in bed, No acute distress. HEENT:  Normocephalic atraumatic.  Sclerae nonicteric, noninjected.  Extraocular movements intact bilaterally. Neck:  Without mass or deformity.  Trachea is midline. Lungs:   Clear to auscultate bilaterally without rhonchi, wheeze, or rales. Heart:  Regular rate and rhythm.  Without murmurs, rubs, or gallops. Abdomen:  Soft, nontender, nondistended.  Without guarding or rebound. Extremities: Noted pressure injuries at left lateral malleolus right buttock and left buttock     Data Reviewed: I have personally reviewed following labs and imaging studies  CBC: Recent Labs  Lab 06/13/23 1300 06/14/23 0515  WBC 6.7 6.2  NEUTROABS 4.0  --   HGB 10.3* 10.9*  HCT 35.8* 37.0  MCV 86.7 84.5  PLT 245 262   Basic Metabolic Panel: Recent Labs  Lab 06/13/23 1300 06/14/23 0515  NA 141 141  K 3.8 3.9  CL 107 106  CO2 23 23  GLUCOSE 109* 100*  BUN 19 14  CREATININE 0.56 0.48  CALCIUM 8.7* 9.2  MG  --  1.6*  PHOS  --  2.6   GFR: Estimated Creatinine Clearance: 95.7 mL/min (by C-G formula based on SCr of 0.48 mg/dL). Liver Function Tests: Recent Labs  Lab 06/13/23 1300  AST 21  ALT 12  ALKPHOS 82  BILITOT 0.3  PROT 7.2  ALBUMIN 2.5*   Coagulation Profile: Recent Labs  Lab 06/13/23 1300  INR 1.1   Sepsis Labs: Recent Labs  Lab 06/13/23 1350 06/13/23 1544  LATICACIDVEN 1.7 1.4    Recent Results (from the past 240 hours)  Blood Culture (routine x 2)     Status: Abnormal   Collection Time: 06/13/23  1:00 PM   Specimen: BLOOD  Result Value Ref Range Status   Specimen Description BLOOD RIGHT ANTECUBITAL  Final   Special Requests   Final    BOTTLES DRAWN AEROBIC AND ANAEROBIC Blood Culture adequate volume   Culture  Setup Time   Final    GRAM POSITIVE RODS ANAEROBIC BOTTLE ONLY CRITICAL RESULT CALLED TO, READ BACK BY AND VERIFIED WITH: PHARMD G ABBOTT 06/14/2023 @ 0354 BY AB GRAM POSITIVE COCCI AEROBIC BOTTLE ONLY CRITICAL RESULT CALLED TO, READ BACK BY AND VERIFIED WITH: PHARMD THUY DANG [id number] AT 1116 BY EC    Culture (A)  Final    STAPHYLOCOCCUS EPIDERMIDIS THE SIGNIFICANCE OF ISOLATING THIS ORGANISM FROM A SINGLE SET OF BLOOD  CULTURES WHEN MULTIPLE SETS ARE DRAWN IS UNCERTAIN. PLEASE NOTIFY THE MICROBIOLOGY DEPARTMENT WITHIN ONE WEEK IF SPECIATION AND SENSITIVITIES ARE REQUIRED. CLOSTRIDIUM PERFRINGENS Standardized susceptibility testing for this organism is not available. Performed at [facility], [address] [place], [place] [address]    Report Status 06/16/2023 FINAL  Final  Blood Culture ID Panel (Reflexed)     Status: Abnormal   Collection Time: 06/13/23  1:00 PM  Result Value Ref Range Status   Enterococcus faecalis NOT DETECTED NOT DETECTED Final   Enterococcus Faecium NOT DETECTED NOT DETECTED Final   Listeria monocytogenes NOT DETECTED NOT DETECTED Final   Staphylococcus species DETECTED (A) NOT DETECTED Final    Comment: CRITICAL RESULT CALLED TO, READ BACK BY AND VERIFIED WITH: PHARMD THUY DANG [id number] AT 1116 BY EC    Staphylococcus aureus (BCID) NOT DETECTED NOT DETECTED Final   Staphylococcus epidermidis DETECTED (A) NOT DETECTED Final    Comment:  Methicillin (oxacillin) resistant coagulase negative staphylococcus. Possible blood culture contaminant (unless isolated from more than one blood culture draw or clinical case suggests pathogenicity). No antibiotic treatment is indicated for blood  culture contaminants. CRITICAL RESULT CALLED TO, READ BACK BY AND VERIFIED WITH: PHARMD THUY DANG [id number] AT 1116 BY EC    Staphylococcus lugdunensis NOT DETECTED NOT DETECTED Final   Streptococcus species NOT DETECTED NOT DETECTED Final   Streptococcus agalactiae NOT DETECTED NOT DETECTED Final   Streptococcus pneumoniae NOT DETECTED NOT DETECTED Final   Streptococcus pyogenes NOT DETECTED NOT DETECTED Final   A.calcoaceticus-baumannii NOT DETECTED NOT DETECTED Final   Bacteroides fragilis NOT DETECTED NOT DETECTED Final   Enterobacterales NOT DETECTED NOT DETECTED Final   Enterobacter cloacae complex NOT DETECTED NOT DETECTED Final   Escherichia coli NOT DETECTED NOT DETECTED Final    Klebsiella aerogenes NOT DETECTED NOT DETECTED Final   Klebsiella oxytoca NOT DETECTED NOT DETECTED Final   Klebsiella pneumoniae NOT DETECTED NOT DETECTED Final   Proteus species NOT DETECTED NOT DETECTED Final   Salmonella species NOT DETECTED NOT DETECTED Final   Serratia marcescens NOT DETECTED NOT DETECTED Final   Haemophilus influenzae NOT DETECTED NOT DETECTED Final   Neisseria meningitidis NOT DETECTED NOT DETECTED Final   Pseudomonas aeruginosa NOT DETECTED NOT DETECTED Final   Stenotrophomonas maltophilia NOT DETECTED NOT DETECTED Final   Candida albicans NOT DETECTED NOT DETECTED Final   Candida auris NOT DETECTED NOT DETECTED Final   Candida glabrata NOT DETECTED NOT DETECTED Final   Candida krusei NOT DETECTED NOT DETECTED Final   Candida parapsilosis NOT DETECTED NOT DETECTED Final   Candida tropicalis NOT DETECTED NOT DETECTED Final   Cryptococcus neoformans/gattii NOT DETECTED NOT DETECTED Final   Methicillin resistance mecA/C DETECTED (A) NOT DETECTED Final    Comment: CRITICAL RESULT CALLED TO, READ BACK BY AND VERIFIED WITH: PHARMD Phillips Climes [id number] AT 1116 BY EC Performed at [facility], [address], [place], [place] [address]   Blood Culture (routine x 2)     Status: None (Preliminary result)   Collection Time: 06/13/23  1:03 PM   Specimen: BLOOD  Result Value Ref Range Status   Specimen Description BLOOD LEFT ANTECUBITAL  Final   Special Requests   Final    BOTTLES DRAWN AEROBIC AND ANAEROBIC Blood Culture adequate volume   Culture   Final    NO GROWTH 4 DAYS Performed at [facility], [address], [place], [place] [address]    Report Status PENDING  Incomplete  Urine Culture     Status: Abnormal   Collection Time: 06/13/23  5:14 PM   Specimen: Urine, Clean Catch  Result Value Ref Range Status   Specimen Description URINE, CLEAN CATCH  Final   Special Requests   Final    NONE Reflexed from [id number] Performed at [facility],  [address] [place], [place] [address]    Culture >=100,000 COLONIES/mL PSEUDOMONAS AERUGINOSA (A)  Final   Report Status 06/17/2023 FINAL  Final   Organism ID, Bacteria PSEUDOMONAS AERUGINOSA (A)  Final      Susceptibility   Pseudomonas aeruginosa - MIC*    CEFTAZIDIME 4 SENSITIVE Sensitive     CIPROFLOXACIN 0.5 SENSITIVE Sensitive     GENTAMICIN 4 SENSITIVE Sensitive     IMIPENEM 1 SENSITIVE Sensitive     PIP/TAZO 32 INTERMEDIATE Intermediate ug/mL    CEFEPIME 4 SENSITIVE Sensitive     * >=100,000 COLONIES/mL PSEUDOMONAS AERUGINOSA  MRSA Next Gen by PCR, Nasal     Status: None   Collection Time: 06/13/23  8:47 PM   Specimen: Nasal Mucosa; Nasal Swab  Result Value Ref Range Status   MRSA by PCR Next Gen NOT DETECTED NOT DETECTED Final    Comment: (NOTE) The GeneXpert MRSA Assay (FDA approved for NASAL specimens only), is one component of a comprehensive MRSA colonization surveillance program. It is not intended to diagnose MRSA infection nor to guide or monitor treatment for MRSA infections. Test performance is not FDA approved in patients less than 23 years old. Performed at [facility], [address], [place], [place] [address]   Culture, blood (Routine X 2) w Reflex to ID Panel     Status: None (Preliminary result)   Collection Time: 06/14/23  3:50 PM   Specimen: BLOOD LEFT ARM  Result Value Ref Range Status   Specimen Description BLOOD LEFT ARM  Final   Special Requests   Final    BOTTLES DRAWN AEROBIC AND ANAEROBIC Blood Culture adequate volume   Culture   Final    NO GROWTH 3 DAYS Performed at [facility], [address], [place], [place] [address]    Report Status PENDING  Incomplete  Culture, blood (Routine X 2) w Reflex to ID Panel     Status: None (Preliminary result)   Collection Time: 06/14/23  3:51 PM   Specimen: BLOOD RIGHT ARM  Result Value Ref Range Status   Specimen Description BLOOD RIGHT ARM  Final   Special Requests   Final    BOTTLES  DRAWN AEROBIC AND ANAEROBIC Blood Culture adequate volume   Culture   Final    NO GROWTH 3 DAYS Performed at [facility], [address] [place], [place] [address]    Report Status PENDING  Incomplete         Radiology Studies: No results found.  Scheduled Meds:  acetaminophen  1,000 mg Oral Once   [MAR Hold] busPIRone  5 mg Oral Daily   [MAR Hold] carvedilol  3.125 mg Oral BID WC   chlorhexidine       [MAR Hold] gabapentin  300 mg Oral TID WC   [MAR Hold] gabapentin  600 mg Oral QHS   [MAR Hold] heparin  5,000 Units Subcutaneous Q8H   [MAR Hold] liver oil-zinc oxide   Topical BID   Continuous Infusions:   ceFAZolin (ANCEF) IV     [MAR Hold] ceFEPime (MAXIPIME) IV 2 g (06/17/23 1310)     LOS: 4 days   Time spent:  Azucena Fallen, DO [facility]  If 7PM-7AM, please contact night-coverage [email or web address]  06/17/2023, 2:48 PM

## 2023-06-17 NOTE — Op Note (Signed)
 DATE OF OPERATION: 06/17/2023  LOCATION: [name]  PREOPERATIVE DIAGNOSIS: Left ankle wound  POSTOPERATIVE DIAGNOSIS: Same  PROCEDURE: excision of left ankle wound (skin and soft tissue 2 cm), placement of myriad powder (500 mg and 5 x 5 cm sheet), primary closure with   SURGEON: Foster Simpson, DO  ASSISTANT: Evelena Leyden, PA  EBL: none  CONDITION: Stable  COMPLICATIONS: None  INDICATION: The patient, [name], is a 53 y.o. female born on 04-07-1970, is here for treatment left lateral ankle wound with hardware exposed.   PROCEDURE DETAILS:  The patient was seen prior to surgery and marked.  The IV antibiotics were given. The patient was taken to the operating room and given a general anesthetic. A standard time out was performed and all information was confirmed by those in the room. The left leg was irrigated with Vashe.  The hardware was removed by Dr. Shon Baton and is dictated separately.  There was 1.5 cm undermining done anteriorly and posteriorly to release the soft tissue.  The lower/inferior wound was excised of nonviable skin and soft tissue (2 cm). All of the myriad powder and sheet was applied.  The skin was brought together and closed with the 3-0 PDS.  A sterile dressing was applied and the leg was wrapped with kerlex and an ace wrap.The patient was allowed to wake up and taken to recovery room in stable condition at the end of the case. The family was notified at the end of the case.   The advanced practice practitioner (APP) assisted throughout the case.  The APP was essential in retraction and counter traction when needed to make the case progress smoothly.  This retraction and assistance made it possible to see the tissue plans for the procedure.  The assistance was needed for blood control, tissue re-approximation and assisted with closure of the incision site.

## 2023-06-17 NOTE — Progress Notes (Signed)
       Overnight   NAME: JOJO GEVING MRN: [id number] DOB : 04/27/1970    Date of Service   06/17/2023   HPI/Events of Note    Notified by RN for concern of medications overnight.  Patient is active for Discharge, however is unable to discharge tonight due to specific transportation.    Interventions/ Plan   Accommodate medications as needed as patient is still admitted in-house. Maintain all active orders otherwise       Chinita Greenland BSN MSNA MSN [id number]-AG Acute Care Nurse Practitioner [facility]

## 2023-06-17 NOTE — Anesthesia Postprocedure Evaluation (Signed)
 Anesthesia Post Note  Patient: [name]  Procedure(s) Performed: IRRIGATION AND DEBRIDEMENT WOUND (Left: Leg Lower) APPLICATION, SKIN SUBSTITUTE (Left: Leg Lower) REMOVAL, HARDWARE LEFT ANKLE (Left)     Patient location during evaluation: PACU Anesthesia Type: General Level of consciousness: awake and alert, patient cooperative and oriented Pain management: pain level controlled Vital Signs Assessment: post-procedure vital signs reviewed and stable Respiratory status: spontaneous breathing, nonlabored ventilation, respiratory function stable and patient connected to nasal cannula oxygen Cardiovascular status: blood pressure returned to baseline and stable Postop Assessment: no apparent nausea or vomiting Anesthetic complications: no   No notable events documented.  Last Vitals:  Vitals:   06/17/23 1715 06/17/23 1730  BP: (!) 101/59 (!) 98/58  Pulse: 96 94  Resp: 10 12  Temp:    SpO2: 95% 98%    Last Pain:  Vitals:   06/17/23 1730  TempSrc:   PainSc: 3                  Jozelynn Danielson,E. Maribel Hadley

## 2023-06-17 NOTE — Op Note (Signed)
 OPERATIVE REPORT  DATE OF SURGERY: 06/17/2023  PATIENT NAME:  [name] MRN: [id number] DOB: 04/04/1970  PCP: [facility]  PRE-OPERATIVE DIAGNOSIS: None healing wound ulcer left lateral ankle with exposed hardware.  Remote history of ORIF of the left ankle  POST-OPERATIVE DIAGNOSIS: Same  PROCEDURE:   Removal of hardware left ankle.  Removed one third tubular plate with 5 screws.  SURGEON:  Venita Lick, MD  ANESTHESIA:   General  EBL: See anesthesia report   Complications: None  BRIEF HISTORY: [name] is a 53 y.o. female who had an ORIF of an ankle fracture several years ago by my partner Dr. Victorino Dike.  Patient is nonambulatory and presented to the hospital with a nonhealing distal lateral malleolus ulceration with exposed hardware.  Plastic surgery and orthopedics were consulted.  The decision was made by the plastic surgeon to take her to the operating room for wound care.  Request was made to remove the hardware at the time of her surgery and so I presented for removal of the hardware.  Prior to surgery all appropriate risks benefits and alternatives were discussed with the patient and consent was obtained.  PROCEDURE DETAILS: Patient was brought into the operating room and was properly positioned on the operating room table.  After induction with general anesthesia the patient was intubated.  A timeout was taken to confirm all important data: including patient, procedure, and the level.   Prior incision site was reopened with a 10 blade scalpel and I sharply dissected down to the hardware.  The hardware was exposed and a total of 5 screws were removed as well as the plate.  The fibula was intact.  There is no abscess or drainage noted.  The lag screw was left in place as it was not compromising the skin.  Once the hardware had been removed the remainder of the surgery was conducted by Dr. Ulice Bold and her team.  Please refer to her dictation for specifics.   Once the orthopedic hardware was removed I scrubbed out.  Venita Lick, MD 06/17/2023 4:09 PM

## 2023-06-17 NOTE — Anesthesia Preprocedure Evaluation (Addendum)
 Anesthesia Evaluation  Patient identified by MRN, date of birth, ID band Patient awake    Reviewed: Allergy & Precautions, NPO status , Patient's Chart, lab work & pertinent test results, reviewed documented beta blocker date and time   History of Anesthesia Complications Negative for: history of anesthetic complications  Airway Mallampati: III   Neck ROM: Full  Mouth opening: Limited Mouth Opening  Dental  (+) Dental Advisory Given   Pulmonary former smoker   breath sounds clear to auscultation       Cardiovascular hypertension, Pt. on home beta blockers and Pt. on medications  Rhythm:Regular Rate:Normal     Neuro/Psych   Anxiety     MS    GI/Hepatic Neg liver ROS,GERD  Medicated,,  Endo/Other    Class 3 obesity  Renal/GU negative Renal ROS     Musculoskeletal  (+) Arthritis ,    Abdominal   Peds  Hematology  (+) Blood dyscrasia (Hgb 10.9), anemia   Anesthesia Other Findings Wound of left lower extremity  Reproductive/Obstetrics                             Anesthesia Physical Anesthesia Plan  ASA: 3  Anesthesia Plan: General   Post-op Pain Management: Ofirmev IV (intra-op)*   Induction: Intravenous  PONV Risk Score and Plan: Treatment may vary due to age or medical condition, Ondansetron, Dexamethasone and Midazolam  Airway Management Planned: LMA  Additional Equipment: None  Intra-op Plan:   Post-operative Plan: Extubation in OR  Informed Consent: I have reviewed the patients History and Physical, chart, labs and discussed the procedure including the risks, benefits and alternatives for the proposed anesthesia with the patient or authorized representative who has indicated his/her understanding and acceptance.     Dental advisory given  Plan Discussed with: CRNA  Anesthesia Plan Comments:        Anesthesia Quick Evaluation

## 2023-06-17 NOTE — Discharge Summary (Signed)
 Physician Discharge Summary  [name] UJW:[id number] DOB: 09-18-1970 DOA: 06/13/2023  PCP: [facility]  Admit date: 06/13/2023 Discharge date: 06/17/2023  Admitted From: Home Disposition:  Home - continue current home health regimen/services  Recommendations for Outpatient Follow-up:  Follow up with PCP in 1-2 weeks  Home Health:Continue prior services  Equipment/Devices:No new equipment  Discharge Condition:Stable  CODE STATUS:Full  Diet recommendation: Resume regular diet   Brief/Interim Summary: [name] is a 53 y.o. female with medical history significant for MS, bedbound, osteomyelitis of the sacrum, osteomyelitis of vertebra, sacral, and sacrococcygeal region, known stage IV pressure ulcer right buttock and left buttock followed by wound care in the outpatient setting, noted recurrent history of UTI previously with Pseudomonas Citrobacter and E. coli.   Patient presents from home with acute metabolic encephalopathy concerning for possible infection.  ED workup without overt findings, patient did not meet sepsis criteria and imaging was unremarkable for acute findings at intake.  UA without clear infection, cultures pending.  Hospitalist called for admission.  Orthopedics called in consult over left lateral malleolus pressure ulcer near prior hardware placement.  Patient admitted as above with acute encephalopathy of unclear etiology - UTI appears to have resolved, unclear if pseudomonas showing up on culture is colonization or true UTI given her improvement on ancef.Given concern that the lateral wound on her left ankle was near previously placed hardware and orthopedic and plastic surgery was consulted.  Patient underwent debridement on 06/17/2023, tolerated well and is otherwise stable and agreeable for discharge home.  Discharge Diagnoses:  Principal Problem:   AMS (altered mental status) Active Problems:   Open wound of left ankle    Discharge  Instructions   Allergies as of 06/17/2023       Reactions   Silver Other (See Comments)   Irritation    Tizanidine Diarrhea   Tomato Rash        Medication List     TAKE these medications    acetaminophen 500 MG tablet Commonly known as: TYLENOL Take 1,000 mg by mouth in the morning, at noon, and at bedtime.   ADULT GUMMY PO Take by mouth daily.   baclofen 20 MG tablet Commonly known as: LIORESAL Take 20 mg by mouth 4 (four) times daily.   busPIRone 5 MG tablet Commonly known as: BUSPAR Take 5 mg by mouth daily.   carvedilol 3.125 MG tablet Commonly known as: COREG Take 3.125 mg by mouth 2 (two) times daily with a meal.   gabapentin 300 MG capsule Commonly known as: NEURONTIN Take 300-600 mg by mouth See admin instructions. 300 mg  morning, noon and dinner and 2 capsules (600mg ) at bedtime.   meloxicam 7.5 MG tablet Commonly known as: MOBIC Take 7.5 mg by mouth daily.   mineral oil-hydrophilic petrolatum ointment Apply topically as needed.   omeprazole 40 MG capsule Commonly known as: PRILOSEC Take 40 mg by mouth daily.   oxyCODONE 5 MG immediate release tablet Commonly known as: Oxy IR/ROXICODONE Take 0.5 tablets (2.5 mg total) by mouth every 8 (eight) hours as needed for up to 3 days for breakthrough pain.   traMADol 50 MG tablet Commonly known as: ULTRAM Take 1 tablet (50 mg total) by mouth every 12 (twelve) hours as needed for moderate pain.   Zinc Oxide 12.8 % ointment Commonly known as: TRIPLE PASTE Apply topically 2 (two) times daily. Apply AROUND sacral wound area. Then follow Dakin's order. What changed:  when to take this reasons to take  this additional instructions         Allergies  Allergen Reactions   Silver Other (See Comments)    Irritation    Tizanidine Diarrhea   Tomato Rash    Consultations: Ortho/plastic surgery   Procedures/Studies: CT Head Wo Contrast Result Date: 06/13/2023 CLINICAL DATA:  Altered mental  status. EXAM: CT HEAD WITHOUT CONTRAST TECHNIQUE: Contiguous axial images were obtained from the base of the skull through the vertex without intravenous contrast. RADIATION DOSE REDUCTION: This exam was performed according to the departmental dose-optimization program which includes automated exposure control, adjustment of the mA and/or kV according to patient size and/or use of iterative reconstruction technique. COMPARISON:  None Available. FINDINGS: Brain: The ventricles and sulci are appropriate size for the patient's age. The gray-white matter discrimination is preserved. There is no acute intracranial hemorrhage. No mass effect or midline shift. No extra-axial fluid collection. Vascular: No hyperdense vessel or unexpected calcification. Skull: Normal. Negative for fracture or focal lesion. Sinuses/Orbits: No acute finding. Other: None IMPRESSION: No acute intracranial pathology. Electronically Signed   By: Elgie Collard M.D.   On: 06/13/2023 16:15   DG Ankle Complete Left Result Date: 06/13/2023 CLINICAL DATA:  Questionable sepsis EXAM: LEFT ANKLE COMPLETE - 3+ VIEW COMPARISON:  03/30/2013 FINDINGS: Frontal, oblique, and lateral views of the left ankle are obtained. Stable position of the orthopedic hardware placed previously, with lateral plate and screw fixation across the distal fibula and 2 cannulated screws traversing the medial malleolus. Bones are diffusely osteopenic. No acute fracture, subluxation, or dislocation. Mild osteoarthritis of the ankle and hindfoot. There is diffuse subcutaneous edema throughout the left lower leg, ankle, and visualized portions of the left foot. No bony destruction or periosteal reaction to suggest osteomyelitis. IMPRESSION: 1. Diffuse soft tissue edema throughout the visualized left lower leg. 2. Diffuse osteopenia.  No acute fracture. 3. Stable ORIF of a bimalleolar fracture. No evidence of orthopedic hardware failure or loosening. Electronically Signed   By:  Sharlet Salina M.D.   On: 06/13/2023 14:51   DG Chest Port 1 View Result Date: 06/13/2023 CLINICAL DATA:  Questionable sepsis - evaluate for abnormality. EXAM: PORTABLE CHEST 1 VIEW COMPARISON:  02/18/2021. FINDINGS: Low lung volume. Bilateral lung fields are clear. Bilateral costophrenic angles are clear. Normal cardio-mediastinal silhouette. No acute osseous abnormalities. The soft tissues are within normal limits. IMPRESSION: No active disease. Electronically Signed   By: Jules Schick M.D.   On: 06/13/2023 14:49     Subjective: No acute issues or events overnight   Discharge Exam: Vitals:   06/17/23 1524 06/17/23 1632  BP: 125/73 (!) (P) 103/45  Pulse: (!) 102 (P) 99  Resp: 20 (P) 12  Temp:  (P) 98.3 F (36.8 C)  SpO2: 99% (P) 100%   Vitals:   06/17/23 0459 06/17/23 1300 06/17/23 1524 06/17/23 1632  BP: 114/81 121/77 125/73 (!) (P) 103/45  Pulse: 99 99 (!) 102 (P) 99  Resp: 16 17 20  (P) 12  Temp: 98.3 F (36.8 C) 98.2 F (36.8 C)  (P) 98.3 F (36.8 C)  TempSrc: Oral Oral    SpO2: 100% 100% 99% (P) 100%  Weight:      Height:       General:  Pleasantly resting in bed, No acute distress. HEENT:  Normocephalic atraumatic.  Sclerae nonicteric, noninjected.  Extraocular movements intact bilaterally. Neck:  Without mass or deformity.  Trachea is midline. Lungs:  Clear to auscultate bilaterally without rhonchi, wheeze, or rales. Heart:  Regular rate and  rhythm.  Without murmurs, rubs, or gallops. Abdomen:  Soft, nontender, nondistended.  Without guarding or rebound. Extremities: Noted pressure injuries at left lateral malleolus right buttock and left buttock  The results of significant diagnostics from this hospitalization (including imaging, microbiology, ancillary and laboratory) are listed below for reference.     Microbiology: Recent Results (from the past 240 hours)  Blood Culture (routine x 2)     Status: Abnormal   Collection Time: 06/13/23  1:00 PM   Specimen:  BLOOD  Result Value Ref Range Status   Specimen Description BLOOD RIGHT ANTECUBITAL  Final   Special Requests   Final    BOTTLES DRAWN AEROBIC AND ANAEROBIC Blood Culture adequate volume   Culture  Setup Time   Final    GRAM POSITIVE RODS ANAEROBIC BOTTLE ONLY CRITICAL RESULT CALLED TO, READ BACK BY AND VERIFIED WITH: PHARMD G ABBOTT 06/14/2023 @ 0354 BY AB GRAM POSITIVE COCCI AEROBIC BOTTLE ONLY CRITICAL RESULT CALLED TO, READ BACK BY AND VERIFIED WITH: PHARMD THUY DANG [id number] AT 1116 BY EC    Culture (A)  Final    STAPHYLOCOCCUS EPIDERMIDIS THE SIGNIFICANCE OF ISOLATING THIS ORGANISM FROM A SINGLE SET OF BLOOD CULTURES WHEN MULTIPLE SETS ARE DRAWN IS UNCERTAIN. PLEASE NOTIFY THE MICROBIOLOGY DEPARTMENT WITHIN ONE WEEK IF SPECIATION AND SENSITIVITIES ARE REQUIRED. CLOSTRIDIUM PERFRINGENS Standardized susceptibility testing for this organism is not available. Performed at [facility], [address] [place], [place] [address]    Report Status 06/16/2023 FINAL  Final  Blood Culture ID Panel (Reflexed)     Status: Abnormal   Collection Time: 06/13/23  1:00 PM  Result Value Ref Range Status   Enterococcus faecalis NOT DETECTED NOT DETECTED Final   Enterococcus Faecium NOT DETECTED NOT DETECTED Final   Listeria monocytogenes NOT DETECTED NOT DETECTED Final   Staphylococcus species DETECTED (A) NOT DETECTED Final    Comment: CRITICAL RESULT CALLED TO, READ BACK BY AND VERIFIED WITH: PHARMD THUY DANG [id number] AT 1116 BY EC    Staphylococcus aureus (BCID) NOT DETECTED NOT DETECTED Final   Staphylococcus epidermidis DETECTED (A) NOT DETECTED Final    Comment: Methicillin (oxacillin) resistant coagulase negative staphylococcus. Possible blood culture contaminant (unless isolated from more than one blood culture draw or clinical case suggests pathogenicity). No antibiotic treatment is indicated for blood  culture contaminants. CRITICAL RESULT CALLED TO, READ BACK BY AND VERIFIED  WITH: PHARMD THUY DANG [id number] AT 1116 BY EC    Staphylococcus lugdunensis NOT DETECTED NOT DETECTED Final   Streptococcus species NOT DETECTED NOT DETECTED Final   Streptococcus agalactiae NOT DETECTED NOT DETECTED Final   Streptococcus pneumoniae NOT DETECTED NOT DETECTED Final   Streptococcus pyogenes NOT DETECTED NOT DETECTED Final   A.calcoaceticus-baumannii NOT DETECTED NOT DETECTED Final   Bacteroides fragilis NOT DETECTED NOT DETECTED Final   Enterobacterales NOT DETECTED NOT DETECTED Final   Enterobacter cloacae complex NOT DETECTED NOT DETECTED Final   Escherichia coli NOT DETECTED NOT DETECTED Final   Klebsiella aerogenes NOT DETECTED NOT DETECTED Final   Klebsiella oxytoca NOT DETECTED NOT DETECTED Final   Klebsiella pneumoniae NOT DETECTED NOT DETECTED Final   Proteus species NOT DETECTED NOT DETECTED Final   Salmonella species NOT DETECTED NOT DETECTED Final   Serratia marcescens NOT DETECTED NOT DETECTED Final   Haemophilus influenzae NOT DETECTED NOT DETECTED Final   Neisseria meningitidis NOT DETECTED NOT DETECTED Final   Pseudomonas aeruginosa NOT DETECTED NOT DETECTED Final   Stenotrophomonas maltophilia NOT DETECTED NOT DETECTED  Final   Candida albicans NOT DETECTED NOT DETECTED Final   Candida auris NOT DETECTED NOT DETECTED Final   Candida glabrata NOT DETECTED NOT DETECTED Final   Candida krusei NOT DETECTED NOT DETECTED Final   Candida parapsilosis NOT DETECTED NOT DETECTED Final   Candida tropicalis NOT DETECTED NOT DETECTED Final   Cryptococcus neoformans/gattii NOT DETECTED NOT DETECTED Final   Methicillin resistance mecA/C DETECTED (A) NOT DETECTED Final    Comment: CRITICAL RESULT CALLED TO, READ BACK BY AND VERIFIED WITH: PHARMD Phillips Climes [id number] AT 1116 BY EC Performed at [facility], [address], [place], [place] [address]   Blood Culture (routine x 2)     Status: None (Preliminary result)   Collection Time: 06/13/23  1:03 PM    Specimen: BLOOD  Result Value Ref Range Status   Specimen Description BLOOD LEFT ANTECUBITAL  Final   Special Requests   Final    BOTTLES DRAWN AEROBIC AND ANAEROBIC Blood Culture adequate volume   Culture   Final    NO GROWTH 4 DAYS Performed at [facility], [address] [place], [place] [address]    Report Status PENDING  Incomplete  Urine Culture     Status: Abnormal   Collection Time: 06/13/23  5:14 PM   Specimen: Urine, Clean Catch  Result Value Ref Range Status   Specimen Description URINE, CLEAN CATCH  Final   Special Requests   Final    NONE Reflexed from [id number] Performed at [facility], [address] [place], [place] [address]    Culture >=100,000 COLONIES/mL PSEUDOMONAS AERUGINOSA (A)  Final   Report Status 06/17/2023 FINAL  Final   Organism ID, Bacteria PSEUDOMONAS AERUGINOSA (A)  Final      Susceptibility   Pseudomonas aeruginosa - MIC*    CEFTAZIDIME 4 SENSITIVE Sensitive     CIPROFLOXACIN 0.5 SENSITIVE Sensitive     GENTAMICIN 4 SENSITIVE Sensitive     IMIPENEM 1 SENSITIVE Sensitive     PIP/TAZO 32 INTERMEDIATE Intermediate ug/mL    CEFEPIME 4 SENSITIVE Sensitive     * >=100,000 COLONIES/mL PSEUDOMONAS AERUGINOSA  MRSA Next Gen by PCR, Nasal     Status: None   Collection Time: 06/13/23  8:47 PM   Specimen: Nasal Mucosa; Nasal Swab  Result Value Ref Range Status   MRSA by PCR Next Gen NOT DETECTED NOT DETECTED Final    Comment: (NOTE) The GeneXpert MRSA Assay (FDA approved for NASAL specimens only), is one component of a comprehensive MRSA colonization surveillance program. It is not intended to diagnose MRSA infection nor to guide or monitor treatment for MRSA infections. Test performance is not FDA approved in patients less than 16 years old. Performed at [facility], [address], [place], [place] [address]   Culture, blood (Routine X 2) w Reflex to ID Panel     Status: None (Preliminary result)   Collection Time: 06/14/23   3:50 PM   Specimen: BLOOD LEFT ARM  Result Value Ref Range Status   Specimen Description BLOOD LEFT ARM  Final   Special Requests   Final    BOTTLES DRAWN AEROBIC AND ANAEROBIC Blood Culture adequate volume   Culture   Final    NO GROWTH 3 DAYS Performed at [facility], [address], [place], [place] [address]    Report Status PENDING  Incomplete  Culture, blood (Routine X 2) w Reflex to ID Panel     Status:  None (Preliminary result)   Collection Time: 06/14/23  3:51 PM   Specimen: BLOOD RIGHT ARM  Result Value Ref Range Status   Specimen Description BLOOD RIGHT ARM  Final   Special Requests   Final    BOTTLES DRAWN AEROBIC AND ANAEROBIC Blood Culture adequate volume   Culture   Final    NO GROWTH 3 DAYS Performed at [facility], [address], [place], [place] [address]    Report Status PENDING  Incomplete     Labs: BNP (last 3 results) No results for input(s): "BNP" in the last 8760 hours. Basic Metabolic Panel: Recent Labs  Lab 06/13/23 1300 06/14/23 0515  NA 141 141  K 3.8 3.9  CL 107 106  CO2 23 23  GLUCOSE 109* 100*  BUN 19 14  CREATININE 0.56 0.48  CALCIUM 8.7* 9.2  MG  --  1.6*  PHOS  --  2.6   Liver Function Tests: Recent Labs  Lab 06/13/23 1300  AST 21  ALT 12  ALKPHOS 82  BILITOT 0.3  PROT 7.2  ALBUMIN 2.5*   No results for input(s): "LIPASE", "AMYLASE" in the last 168 hours. No results for input(s): "AMMONIA" in the last 168 hours. CBC: Recent Labs  Lab 06/13/23 1300 06/14/23 0515  WBC 6.7 6.2  NEUTROABS 4.0  --   HGB 10.3* 10.9*  HCT 35.8* 37.0  MCV 86.7 84.5  PLT 245 262   Cardiac Enzymes: No results for input(s): "CKTOTAL", "CKMB", "CKMBINDEX", "TROPONINI" in the last 168 hours. BNP: Invalid input(s): "POCBNP" CBG: No results for input(s): "GLUCAP" in the last 168 hours. D-Dimer No results for input(s): "DDIMER" in the last 72 hours. Hgb A1c No results for input(s): "HGBA1C" in the last 72 hours. Lipid  Profile No results for input(s): "CHOL", "HDL", "LDLCALC", "TRIG", "CHOLHDL", "LDLDIRECT" in the last 72 hours. Thyroid function studies No results for input(s): "TSH", "T4TOTAL", "T3FREE", "THYROIDAB" in the last 72 hours.  Invalid input(s): "FREET3" Anemia work up No results for input(s): "VITAMINB12", "FOLATE", "FERRITIN", "TIBC", "IRON", "RETICCTPCT" in the last 72 hours. Urinalysis    Component Value Date/Time   COLORURINE STRAW (A) 06/13/2023 1714   APPEARANCEUR CLEAR 06/13/2023 1714   LABSPEC 1.012 06/13/2023 1714   PHURINE 5.0 06/13/2023 1714   GLUCOSEU NEGATIVE 06/13/2023 1714   HGBUR NEGATIVE 06/13/2023 1714   BILIRUBINUR NEGATIVE 06/13/2023 1714   KETONESUR NEGATIVE 06/13/2023 1714   PROTEINUR NEGATIVE 06/13/2023 1714   UROBILINOGEN 1.0 09/11/2010 1052   NITRITE NEGATIVE 06/13/2023 1714   LEUKOCYTESUR MODERATE (A) 06/13/2023 1714   Sepsis Labs Recent Labs  Lab 06/13/23 1300 06/14/23 0515  WBC 6.7 6.2   Microbiology Recent Results (from the past 240 hours)  Blood Culture (routine x 2)     Status: Abnormal   Collection Time: 06/13/23  1:00 PM   Specimen: BLOOD  Result Value Ref Range Status   Specimen Description BLOOD RIGHT ANTECUBITAL  Final   Special Requests   Final    BOTTLES DRAWN AEROBIC AND ANAEROBIC Blood Culture adequate volume   Culture  Setup Time   Final    GRAM POSITIVE RODS ANAEROBIC BOTTLE ONLY CRITICAL RESULT CALLED TO, READ BACK BY AND VERIFIED WITH: PHARMD G ABBOTT 06/14/2023 @ 0354 BY AB GRAM POSITIVE COCCI AEROBIC BOTTLE ONLY CRITICAL RESULT CALLED TO, READ BACK BY AND VERIFIED WITH: PHARMD THUY DANG [id number] AT 1116 BY EC    Culture (A)  Final    STAPHYLOCOCCUS EPIDERMIDIS THE SIGNIFICANCE OF ISOLATING THIS ORGANISM  FROM A SINGLE SET OF BLOOD CULTURES WHEN MULTIPLE SETS ARE DRAWN IS UNCERTAIN. PLEASE NOTIFY THE MICROBIOLOGY DEPARTMENT WITHIN ONE WEEK IF SPECIATION AND SENSITIVITIES ARE REQUIRED. CLOSTRIDIUM PERFRINGENS Standardized  susceptibility testing for this organism is not available. Performed at [facility], [address] [place], [place] [address]    Report Status 06/16/2023 FINAL  Final  Blood Culture ID Panel (Reflexed)     Status: Abnormal   Collection Time: 06/13/23  1:00 PM  Result Value Ref Range Status   Enterococcus faecalis NOT DETECTED NOT DETECTED Final   Enterococcus Faecium NOT DETECTED NOT DETECTED Final   Listeria monocytogenes NOT DETECTED NOT DETECTED Final   Staphylococcus species DETECTED (A) NOT DETECTED Final    Comment: CRITICAL RESULT CALLED TO, READ BACK BY AND VERIFIED WITH: PHARMD THUY DANG [id number] AT 1116 BY EC    Staphylococcus aureus (BCID) NOT DETECTED NOT DETECTED Final   Staphylococcus epidermidis DETECTED (A) NOT DETECTED Final    Comment: Methicillin (oxacillin) resistant coagulase negative staphylococcus. Possible blood culture contaminant (unless isolated from more than one blood culture draw or clinical case suggests pathogenicity). No antibiotic treatment is indicated for blood  culture contaminants. CRITICAL RESULT CALLED TO, READ BACK BY AND VERIFIED WITH: PHARMD THUY DANG [phone] AT 1116 BY EC    Staphylococcus lugdunensis NOT DETECTED NOT DETECTED Final   Streptococcus species NOT DETECTED NOT DETECTED Final   Streptococcus agalactiae NOT DETECTED NOT DETECTED Final   Streptococcus pneumoniae NOT DETECTED NOT DETECTED Final   Streptococcus pyogenes NOT DETECTED NOT DETECTED Final   A.calcoaceticus-baumannii NOT DETECTED NOT DETECTED Final   Bacteroides fragilis NOT DETECTED NOT DETECTED Final   Enterobacterales NOT DETECTED NOT DETECTED Final   Enterobacter cloacae complex NOT DETECTED NOT DETECTED Final   Escherichia coli NOT DETECTED NOT DETECTED Final   Klebsiella aerogenes NOT DETECTED NOT DETECTED Final   Klebsiella oxytoca NOT DETECTED NOT DETECTED Final   Klebsiella pneumoniae NOT DETECTED NOT DETECTED Final   Proteus species NOT DETECTED NOT  DETECTED Final   Salmonella species NOT DETECTED NOT DETECTED Final   Serratia marcescens NOT DETECTED NOT DETECTED Final   Haemophilus influenzae NOT DETECTED NOT DETECTED Final   Neisseria meningitidis NOT DETECTED NOT DETECTED Final   Pseudomonas aeruginosa NOT DETECTED NOT DETECTED Final   Stenotrophomonas maltophilia NOT DETECTED NOT DETECTED Final   Candida albicans NOT DETECTED NOT DETECTED Final   Candida auris NOT DETECTED NOT DETECTED Final   Candida glabrata NOT DETECTED NOT DETECTED Final   Candida krusei NOT DETECTED NOT DETECTED Final   Candida parapsilosis NOT DETECTED NOT DETECTED Final   Candida tropicalis NOT DETECTED NOT DETECTED Final   Cryptococcus neoformans/gattii NOT DETECTED NOT DETECTED Final   Methicillin resistance mecA/C DETECTED (A) NOT DETECTED Final    Comment: CRITICAL RESULT CALLED TO, READ BACK BY AND VERIFIED WITH: PHARMD Phillips Climes [id number] AT 1116 BY EC Performed at [facility], [address] [place], [place] [address]   Blood Culture (routine x 2)     Status: None (Preliminary result)   Collection Time: 06/13/23  1:03 PM   Specimen: BLOOD  Result Value Ref Range Status   Specimen Description BLOOD LEFT ANTECUBITAL  Final   Special Requests   Final    BOTTLES DRAWN AEROBIC AND ANAEROBIC Blood Culture adequate volume   Culture   Final    NO GROWTH 4 DAYS Performed at [facility], [address] [place], [place] [address]  Report Status PENDING  Incomplete  Urine Culture     Status: Abnormal   Collection Time: 06/13/23  5:14 PM   Specimen: Urine, Clean Catch  Result Value Ref Range Status   Specimen Description URINE, CLEAN CATCH  Final   Special Requests   Final    NONE Reflexed from [id number] Performed at [facility], [address] [place], [place] [address]    Culture >=100,000 COLONIES/mL PSEUDOMONAS AERUGINOSA (A)  Final   Report Status 06/17/2023 FINAL  Final   Organism ID, Bacteria PSEUDOMONAS AERUGINOSA (A)   Final      Susceptibility   Pseudomonas aeruginosa - MIC*    CEFTAZIDIME 4 SENSITIVE Sensitive     CIPROFLOXACIN 0.5 SENSITIVE Sensitive     GENTAMICIN 4 SENSITIVE Sensitive     IMIPENEM 1 SENSITIVE Sensitive     PIP/TAZO 32 INTERMEDIATE Intermediate ug/mL    CEFEPIME 4 SENSITIVE Sensitive     * >=100,000 COLONIES/mL PSEUDOMONAS AERUGINOSA  MRSA Next Gen by PCR, Nasal     Status: None   Collection Time: 06/13/23  8:47 PM   Specimen: Nasal Mucosa; Nasal Swab  Result Value Ref Range Status   MRSA by PCR Next Gen NOT DETECTED NOT DETECTED Final    Comment: (NOTE) The GeneXpert MRSA Assay (FDA approved for NASAL specimens only), is one component of a comprehensive MRSA colonization surveillance program. It is not intended to diagnose MRSA infection nor to guide or monitor treatment for MRSA infections. Test performance is not FDA approved in patients less than 26 years old. Performed at [facility], [address], [place], [place] [address]   Culture, blood (Routine X 2) w Reflex to ID Panel     Status: None (Preliminary result)   Collection Time: 06/14/23  3:50 PM   Specimen: BLOOD LEFT ARM  Result Value Ref Range Status   Specimen Description BLOOD LEFT ARM  Final   Special Requests   Final    BOTTLES DRAWN AEROBIC AND ANAEROBIC Blood Culture adequate volume   Culture   Final    NO GROWTH 3 DAYS Performed at [facility], [address], [place], [place] [address]    Report Status PENDING  Incomplete  Culture, blood (Routine X 2) w Reflex to ID Panel     Status: None (Preliminary result)   Collection Time: 06/14/23  3:51 PM   Specimen: BLOOD RIGHT ARM  Result Value Ref Range Status   Specimen Description BLOOD RIGHT ARM  Final   Special Requests   Final    BOTTLES DRAWN AEROBIC AND ANAEROBIC Blood Culture adequate volume   Culture   Final    NO GROWTH 3 DAYS Performed at [facility], [address], [place], [place] [address]    Report Status PENDING   Incomplete     Time coordinating discharge: Over 30 minutes  SIGNED:   Azucena Fallen, DO [facility] 06/17/2023, 4:40 PM Pager   If 7PM-7AM, please contact night-coverage [email or web address]

## 2023-06-18 ENCOUNTER — Encounter (HOSPITAL_COMMUNITY): Payer: Self-pay | Admitting: Plastic Surgery

## 2023-06-18 DIAGNOSIS — R4182 Altered mental status, unspecified: Secondary | ICD-10-CM | POA: Diagnosis not present

## 2023-06-18 DIAGNOSIS — S91002A Unspecified open wound, left ankle, initial encounter: Secondary | ICD-10-CM | POA: Diagnosis not present

## 2023-06-18 DIAGNOSIS — G35 Multiple sclerosis: Secondary | ICD-10-CM | POA: Diagnosis not present

## 2023-06-18 DIAGNOSIS — I10 Essential (primary) hypertension: Secondary | ICD-10-CM | POA: Diagnosis not present

## 2023-06-18 LAB — CULTURE, BLOOD (ROUTINE X 2)
Culture: NO GROWTH
Special Requests: ADEQUATE

## 2023-06-18 MED ORDER — PROPOFOL 10 MG/ML IV BOLUS
INTRAVENOUS | Status: AC
  Filled 2023-06-18: qty 20

## 2023-06-18 NOTE — Plan of Care (Signed)
  Problem: Clinical Measurements: Goal: Diagnostic test results will improve Outcome: Not Progressing   Problem: Activity: Goal: Risk for activity intolerance will decrease Outcome: Not Progressing   Problem: Coping: Goal: Level of anxiety will decrease Outcome: Not Progressing   Problem: Pain Managment: Goal: General experience of comfort will improve and/or be controlled Outcome: Not Progressing

## 2023-06-18 NOTE — Progress Notes (Addendum)
 Pt ready for d/c. [facility] called for pt pickup.Transportation papers on front of chart. RN, patient, patient's mom, and Shawn /  [facility] aware of d/c plan.  Gae Gallop RN,BSN,CM

## 2023-06-18 NOTE — Discharge Summary (Signed)
 Physician Discharge Summary  [name] WNU:[id number] DOB: 1971/01/24 DOA: 06/13/2023  PCP: [facility]  Admit date: 06/13/2023 Discharge date: 06/18/2023  Admitted From: Home Disposition:  Home - continue current home health regimen/services  Recommendations for Outpatient Follow-up:  Follow up with PCP in 1-2 weeks  Home Health:Continue prior services  Equipment/Devices:No new equipment  Discharge Condition:Stable  CODE STATUS:Full  Diet recommendation: Resume regular diet   Brief/Interim Summary: [name] is a 53 y.o. female with medical history significant for MS, bedbound, osteomyelitis of the sacrum, osteomyelitis of vertebra, sacral, and sacrococcygeal region, known stage IV pressure ulcer right buttock and left buttock followed by wound care in the outpatient setting, noted recurrent history of UTI previously with Pseudomonas Citrobacter and E. coli.    Discharge Diagnoses:  Active Problems:   Multiple sclerosis (HCC)   Essential hypertension, benign   Stage 4 decubitus ulcer (HCC)   Acute metabolic encephalopathy   Open wound of left ankle  Acute metabolic encephalopathy: Etiology remains unclear patient improved with supportive care and antibiotics. Was weaned off narcotics given at risk of polypharmacy.   Rule out UTI/rule out wound infection present on admission No evidence of sepsis on admission. Ortho recommended conservative management. Was treated with IV cefepime due to his history of prior Pseudomonas and Citrobacter.   Multiple sclerosis (HCC) Noted.   Anemia of chronic disease in the setting of chronic pressure ulcer Noted.   Hypoalbuminemia/moderate protein caloric malnutrition Albumin is 2.5.   Obesity: Noted.   Open wound of left ankle/multiple sacral decubitus ulcer present on admission/ Stage 4 decubitus ulcer (HCC): Follow-up with wound care as an outpatient. Patient to relates at home open use she uses meloxicam. Has  been weaning off narcotics.  Discharge Instructions  Allergies as of 06/18/2023       Reactions   Silver Other (See Comments)   Irritation    Tizanidine Diarrhea   Tomato Rash        Medication List     TAKE these medications    acetaminophen 500 MG tablet Commonly known as: TYLENOL Take 1,000 mg by mouth in the morning, at noon, and at bedtime.   ADULT GUMMY PO Take by mouth daily.   baclofen 20 MG tablet Commonly known as: LIORESAL Take 20 mg by mouth 4 (four) times daily.   busPIRone 5 MG tablet Commonly known as: BUSPAR Take 5 mg by mouth daily.   carvedilol 3.125 MG tablet Commonly known as: COREG Take 3.125 mg by mouth 2 (two) times daily with a meal.   gabapentin 300 MG capsule Commonly known as: NEURONTIN Take 300-600 mg by mouth See admin instructions. 300 mg  morning, noon and dinner and 2 capsules (600mg ) at bedtime.   meloxicam 7.5 MG tablet Commonly known as: MOBIC Take 7.5 mg by mouth daily.   mineral oil-hydrophilic petrolatum ointment Apply topically as needed.   omeprazole 40 MG capsule Commonly known as: PRILOSEC Take 40 mg by mouth daily.   oxyCODONE 5 MG immediate release tablet Commonly known as: Oxy IR/ROXICODONE Take 0.5 tablets (2.5 mg total) by mouth every 8 (eight) hours as needed for up to 3 days for breakthrough pain.   traMADol 50 MG tablet Commonly known as: ULTRAM Take 1 tablet (50 mg total) by mouth every 12 (twelve) hours as needed for moderate pain.   Zinc Oxide 12.8 % ointment Commonly known as: TRIPLE PASTE Apply topically 2 (two) times daily. Apply AROUND sacral wound area. Then follow Dakin's order.  What changed:  when to take this reasons to take this additional instructions         Allergies  Allergen Reactions   Silver Other (See Comments)    Irritation    Tizanidine Diarrhea   Tomato Rash    Consultations: Ortho/plastic surgery   Procedures/Studies: CT Head Wo Contrast Result Date:  06/13/2023 CLINICAL DATA:  Altered mental status. EXAM: CT HEAD WITHOUT CONTRAST TECHNIQUE: Contiguous axial images were obtained from the base of the skull through the vertex without intravenous contrast. RADIATION DOSE REDUCTION: This exam was performed according to the departmental dose-optimization program which includes automated exposure control, adjustment of the mA and/or kV according to patient size and/or use of iterative reconstruction technique. COMPARISON:  None Available. FINDINGS: Brain: The ventricles and sulci are appropriate size for the patient's age. The gray-white matter discrimination is preserved. There is no acute intracranial hemorrhage. No mass effect or midline shift. No extra-axial fluid collection. Vascular: No hyperdense vessel or unexpected calcification. Skull: Normal. Negative for fracture or focal lesion. Sinuses/Orbits: No acute finding. Other: None IMPRESSION: No acute intracranial pathology. Electronically Signed   By: Elgie Collard M.D.   On: 06/13/2023 16:15   DG Ankle Complete Left Result Date: 06/13/2023 CLINICAL DATA:  Questionable sepsis EXAM: LEFT ANKLE COMPLETE - 3+ VIEW COMPARISON:  03/30/2013 FINDINGS: Frontal, oblique, and lateral views of the left ankle are obtained. Stable position of the orthopedic hardware placed previously, with lateral plate and screw fixation across the distal fibula and 2 cannulated screws traversing the medial malleolus. Bones are diffusely osteopenic. No acute fracture, subluxation, or dislocation. Mild osteoarthritis of the ankle and hindfoot. There is diffuse subcutaneous edema throughout the left lower leg, ankle, and visualized portions of the left foot. No bony destruction or periosteal reaction to suggest osteomyelitis. IMPRESSION: 1. Diffuse soft tissue edema throughout the visualized left lower leg. 2. Diffuse osteopenia.  No acute fracture. 3. Stable ORIF of a bimalleolar fracture. No evidence of orthopedic hardware failure or  loosening. Electronically Signed   By: Sharlet Salina M.D.   On: 06/13/2023 14:51   DG Chest Port 1 View Result Date: 06/13/2023 CLINICAL DATA:  Questionable sepsis - evaluate for abnormality. EXAM: PORTABLE CHEST 1 VIEW COMPARISON:  02/18/2021. FINDINGS: Low lung volume. Bilateral lung fields are clear. Bilateral costophrenic angles are clear. Normal cardio-mediastinal silhouette. No acute osseous abnormalities. The soft tissues are within normal limits. IMPRESSION: No active disease. Electronically Signed   By: Jules Schick M.D.   On: 06/13/2023 14:49     Subjective: No acute issues or events overnight   Discharge Exam: Vitals:   06/18/23 0417 06/18/23 0712  BP: 121/70 128/76  Pulse: 100 97  Resp: 18   Temp: 98.7 F (37.1 C) 98.8 F (37.1 C)  SpO2: 98% 100%   Vitals:   06/17/23 1751 06/17/23 2019 06/18/23 0417 06/18/23 0712  BP: (!) 97/56 108/69 121/70 128/76  Pulse: 97 92 100 97  Resp: 16 18 18    Temp: 98.1 F (36.7 C) 97.8 F (36.6 C) 98.7 F (37.1 C) 98.8 F (37.1 C)  TempSrc: Oral Oral Oral Oral  SpO2: 98% 99% 98% 100%  Weight:      Height:       General:  Pleasantly resting in bed, No acute distress. HEENT:  Normocephalic atraumatic.  Sclerae nonicteric, noninjected.  Extraocular movements intact bilaterally. Neck:  Without mass or deformity.  Trachea is midline. Lungs:  Clear to auscultate bilaterally without rhonchi, wheeze, or rales. Heart:  Regular rate and rhythm.  Without murmurs, rubs, or gallops. Abdomen:  Soft, nontender, nondistended.  Without guarding or rebound. Extremities: Noted pressure injuries at left lateral malleolus right buttock and left buttock  The results of significant diagnostics from this hospitalization (including imaging, microbiology, ancillary and laboratory) are listed below for reference.     Microbiology: Recent Results (from the past 240 hours)  Blood Culture (routine x 2)     Status: Abnormal   Collection Time: 06/13/23   1:00 PM   Specimen: BLOOD  Result Value Ref Range Status   Specimen Description BLOOD RIGHT ANTECUBITAL  Final   Special Requests   Final    BOTTLES DRAWN AEROBIC AND ANAEROBIC Blood Culture adequate volume   Culture  Setup Time   Final    GRAM POSITIVE RODS ANAEROBIC BOTTLE ONLY CRITICAL RESULT CALLED TO, READ BACK BY AND VERIFIED WITH: PHARMD G ABBOTT 06/14/2023 @ 0354 BY AB GRAM POSITIVE COCCI AEROBIC BOTTLE ONLY CRITICAL RESULT CALLED TO, READ BACK BY AND VERIFIED WITH: PHARMD THUY DANG [id number] AT 1116 BY EC    Culture (A)  Final    STAPHYLOCOCCUS EPIDERMIDIS THE SIGNIFICANCE OF ISOLATING THIS ORGANISM FROM A SINGLE SET OF BLOOD CULTURES WHEN MULTIPLE SETS ARE DRAWN IS UNCERTAIN. PLEASE NOTIFY THE MICROBIOLOGY DEPARTMENT WITHIN ONE WEEK IF SPECIATION AND SENSITIVITIES ARE REQUIRED. CLOSTRIDIUM PERFRINGENS Standardized susceptibility testing for this organism is not available. Performed at [facility], [address] [place], [place] [address]    Report Status 06/16/2023 FINAL  Final  Blood Culture ID Panel (Reflexed)     Status: Abnormal   Collection Time: 06/13/23  1:00 PM  Result Value Ref Range Status   Enterococcus faecalis NOT DETECTED NOT DETECTED Final   Enterococcus Faecium NOT DETECTED NOT DETECTED Final   Listeria monocytogenes NOT DETECTED NOT DETECTED Final   Staphylococcus species DETECTED (A) NOT DETECTED Final    Comment: CRITICAL RESULT CALLED TO, READ BACK BY AND VERIFIED WITH: PHARMD THUY DANG [id number] AT 1116 BY EC    Staphylococcus aureus (BCID) NOT DETECTED NOT DETECTED Final   Staphylococcus epidermidis DETECTED (A) NOT DETECTED Final    Comment: Methicillin (oxacillin) resistant coagulase negative staphylococcus. Possible blood culture contaminant (unless isolated from more than one blood culture draw or clinical case suggests pathogenicity). No antibiotic treatment is indicated for blood  culture contaminants. CRITICAL RESULT CALLED TO, READ  BACK BY AND VERIFIED WITH: PHARMD THUY DANG [id number] AT 1116 BY EC    Staphylococcus lugdunensis NOT DETECTED NOT DETECTED Final   Streptococcus species NOT DETECTED NOT DETECTED Final   Streptococcus agalactiae NOT DETECTED NOT DETECTED Final   Streptococcus pneumoniae NOT DETECTED NOT DETECTED Final   Streptococcus pyogenes NOT DETECTED NOT DETECTED Final   A.calcoaceticus-baumannii NOT DETECTED NOT DETECTED Final   Bacteroides fragilis NOT DETECTED NOT DETECTED Final   Enterobacterales NOT DETECTED NOT DETECTED Final   Enterobacter cloacae complex NOT DETECTED NOT DETECTED Final   Escherichia coli NOT DETECTED NOT DETECTED Final   Klebsiella aerogenes NOT DETECTED NOT DETECTED Final   Klebsiella oxytoca NOT DETECTED NOT DETECTED Final   Klebsiella pneumoniae NOT DETECTED NOT DETECTED Final   Proteus species NOT DETECTED NOT DETECTED Final   Salmonella species NOT DETECTED NOT DETECTED Final   Serratia marcescens NOT DETECTED NOT DETECTED Final   Haemophilus influenzae NOT DETECTED NOT DETECTED Final   Neisseria meningitidis NOT DETECTED NOT DETECTED Final   Pseudomonas aeruginosa NOT DETECTED NOT DETECTED Final   Stenotrophomonas maltophilia NOT  DETECTED NOT DETECTED Final   Candida albicans NOT DETECTED NOT DETECTED Final   Candida auris NOT DETECTED NOT DETECTED Final   Candida glabrata NOT DETECTED NOT DETECTED Final   Candida krusei NOT DETECTED NOT DETECTED Final   Candida parapsilosis NOT DETECTED NOT DETECTED Final   Candida tropicalis NOT DETECTED NOT DETECTED Final   Cryptococcus neoformans/gattii NOT DETECTED NOT DETECTED Final   Methicillin resistance mecA/C DETECTED (A) NOT DETECTED Final    Comment: CRITICAL RESULT CALLED TO, READ BACK BY AND VERIFIED WITH: PHARMD Phillips Climes [id number] AT 1116 BY EC Performed at [facility], [address], [place], [place] [address]   Blood Culture (routine x 2)     Status: None (Preliminary result)   Collection Time: 06/13/23   1:03 PM   Specimen: BLOOD  Result Value Ref Range Status   Specimen Description BLOOD LEFT ANTECUBITAL  Final   Special Requests   Final    BOTTLES DRAWN AEROBIC AND ANAEROBIC Blood Culture adequate volume   Culture   Final    NO GROWTH 4 DAYS Performed at [facility], [address] [place], [place] [address]    Report Status PENDING  Incomplete  Urine Culture     Status: Abnormal   Collection Time: 06/13/23  5:14 PM   Specimen: Urine, Clean Catch  Result Value Ref Range Status   Specimen Description URINE, CLEAN CATCH  Final   Special Requests   Final    NONE Reflexed from [id number] Performed at [facility], [address], [place], [place] [address]    Culture >=100,000 COLONIES/mL PSEUDOMONAS AERUGINOSA (A)  Final   Report Status 06/17/2023 FINAL  Final   Organism ID, Bacteria PSEUDOMONAS AERUGINOSA (A)  Final      Susceptibility   Pseudomonas aeruginosa - MIC*    CEFTAZIDIME 4 SENSITIVE Sensitive     CIPROFLOXACIN 0.5 SENSITIVE Sensitive     GENTAMICIN 4 SENSITIVE Sensitive     IMIPENEM 1 SENSITIVE Sensitive     PIP/TAZO 32 INTERMEDIATE Intermediate ug/mL    CEFEPIME 4 SENSITIVE Sensitive     * >=100,000 COLONIES/mL PSEUDOMONAS AERUGINOSA  MRSA Next Gen by PCR, Nasal     Status: None   Collection Time: 06/13/23  8:47 PM   Specimen: Nasal Mucosa; Nasal Swab  Result Value Ref Range Status   MRSA by PCR Next Gen NOT DETECTED NOT DETECTED Final    Comment: (NOTE) The GeneXpert MRSA Assay (FDA approved for NASAL specimens only), is one component of a comprehensive MRSA colonization surveillance program. It is not intended to diagnose MRSA infection nor to guide or monitor treatment for MRSA infections. Test performance is not FDA approved in patients less than 72 years old. Performed at [facility], [address] [place], [place] [address]   Culture, blood (Routine X 2) w Reflex to ID Panel     Status: None (Preliminary result)   Collection Time:  06/14/23  3:50 PM   Specimen: BLOOD LEFT ARM  Result Value Ref Range Status   Specimen Description BLOOD LEFT ARM  Final   Special Requests   Final    BOTTLES DRAWN AEROBIC AND ANAEROBIC Blood Culture adequate volume   Culture   Final    NO GROWTH 3 DAYS Performed at [facility], [address] [place], [place] [address]    Report Status PENDING  Incomplete  Culture, blood (Routine X 2) w Reflex to ID Panel  Status: None (Preliminary result)   Collection Time: 06/14/23  3:51 PM   Specimen: BLOOD RIGHT ARM  Result Value Ref Range Status   Specimen Description BLOOD RIGHT ARM  Final   Special Requests   Final    BOTTLES DRAWN AEROBIC AND ANAEROBIC Blood Culture adequate volume   Culture   Final    NO GROWTH 3 DAYS Performed at [facility], [address], [place], [place] [address]    Report Status PENDING  Incomplete     Labs: BNP (last 3 results) No results for input(s): "BNP" in the last 8760 hours. Basic Metabolic Panel: Recent Labs  Lab 06/13/23 1300 06/14/23 0515  NA 141 141  K 3.8 3.9  CL 107 106  CO2 23 23  GLUCOSE 109* 100*  BUN 19 14  CREATININE 0.56 0.48  CALCIUM 8.7* 9.2  MG  --  1.6*  PHOS  --  2.6   Liver Function Tests: Recent Labs  Lab 06/13/23 1300  AST 21  ALT 12  ALKPHOS 82  BILITOT 0.3  PROT 7.2  ALBUMIN 2.5*   No results for input(s): "LIPASE", "AMYLASE" in the last 168 hours. No results for input(s): "AMMONIA" in the last 168 hours. CBC: Recent Labs  Lab 06/13/23 1300 06/14/23 0515  WBC 6.7 6.2  NEUTROABS 4.0  --   HGB 10.3* 10.9*  HCT 35.8* 37.0  MCV 86.7 84.5  PLT 245 262   Cardiac Enzymes: No results for input(s): "CKTOTAL", "CKMB", "CKMBINDEX", "TROPONINI" in the last 168 hours. BNP: Invalid input(s): "POCBNP" CBG: No results for input(s): "GLUCAP" in the last 168 hours. D-Dimer No results for input(s): "DDIMER" in the last 72 hours. Hgb A1c No results for input(s): "HGBA1C" in the last 72  hours. Lipid Profile No results for input(s): "CHOL", "HDL", "LDLCALC", "TRIG", "CHOLHDL", "LDLDIRECT" in the last 72 hours. Thyroid function studies No results for input(s): "TSH", "T4TOTAL", "T3FREE", "THYROIDAB" in the last 72 hours.  Invalid input(s): "FREET3" Anemia work up No results for input(s): "VITAMINB12", "FOLATE", "FERRITIN", "TIBC", "IRON", "RETICCTPCT" in the last 72 hours. Urinalysis    Component Value Date/Time   COLORURINE STRAW (A) 06/13/2023 1714   APPEARANCEUR CLEAR 06/13/2023 1714   LABSPEC 1.012 06/13/2023 1714   PHURINE 5.0 06/13/2023 1714   GLUCOSEU NEGATIVE 06/13/2023 1714   HGBUR NEGATIVE 06/13/2023 1714   BILIRUBINUR NEGATIVE 06/13/2023 1714   KETONESUR NEGATIVE 06/13/2023 1714   PROTEINUR NEGATIVE 06/13/2023 1714   UROBILINOGEN 1.0 09/11/2010 1052   NITRITE NEGATIVE 06/13/2023 1714   LEUKOCYTESUR MODERATE (A) 06/13/2023 1714   Sepsis Labs Recent Labs  Lab 06/13/23 1300 06/14/23 0515  WBC 6.7 6.2   Microbiology Recent Results (from the past 240 hours)  Blood Culture (routine x 2)     Status: Abnormal   Collection Time: 06/13/23  1:00 PM   Specimen: BLOOD  Result Value Ref Range Status   Specimen Description BLOOD RIGHT ANTECUBITAL  Final   Special Requests   Final    BOTTLES DRAWN AEROBIC AND ANAEROBIC Blood Culture adequate volume   Culture  Setup Time   Final    GRAM POSITIVE RODS ANAEROBIC BOTTLE ONLY CRITICAL RESULT CALLED TO, READ BACK BY AND VERIFIED WITH: PHARMD G ABBOTT 06/14/2023 @ 0354 BY AB GRAM POSITIVE COCCI AEROBIC BOTTLE ONLY CRITICAL RESULT CALLED TO, READ BACK BY AND VERIFIED WITH: PHARMD THUY DANG [id number] AT 1116 BY EC    Culture (A)  Final    STAPHYLOCOCCUS EPIDERMIDIS THE SIGNIFICANCE OF ISOLATING THIS  ORGANISM FROM A SINGLE SET OF BLOOD CULTURES WHEN MULTIPLE SETS ARE DRAWN IS UNCERTAIN. PLEASE NOTIFY THE MICROBIOLOGY DEPARTMENT WITHIN ONE WEEK IF SPECIATION AND SENSITIVITIES ARE REQUIRED. CLOSTRIDIUM  PERFRINGENS Standardized susceptibility testing for this organism is not available. Performed at [facility], [address] [place], [place] [address]    Report Status 06/16/2023 FINAL  Final  Blood Culture ID Panel (Reflexed)     Status: Abnormal   Collection Time: 06/13/23  1:00 PM  Result Value Ref Range Status   Enterococcus faecalis NOT DETECTED NOT DETECTED Final   Enterococcus Faecium NOT DETECTED NOT DETECTED Final   Listeria monocytogenes NOT DETECTED NOT DETECTED Final   Staphylococcus species DETECTED (A) NOT DETECTED Final    Comment: CRITICAL RESULT CALLED TO, READ BACK BY AND VERIFIED WITH: PHARMD THUY DANG [id number] AT 1116 BY EC    Staphylococcus aureus (BCID) NOT DETECTED NOT DETECTED Final   Staphylococcus epidermidis DETECTED (A) NOT DETECTED Final    Comment: Methicillin (oxacillin) resistant coagulase negative staphylococcus. Possible blood culture contaminant (unless isolated from more than one blood culture draw or clinical case suggests pathogenicity). No antibiotic treatment is indicated for blood  culture contaminants. CRITICAL RESULT CALLED TO, READ BACK BY AND VERIFIED WITH: PHARMD THUY DANG [id number] AT 1116 BY EC    Staphylococcus lugdunensis NOT DETECTED NOT DETECTED Final   Streptococcus species NOT DETECTED NOT DETECTED Final   Streptococcus agalactiae NOT DETECTED NOT DETECTED Final   Streptococcus pneumoniae NOT DETECTED NOT DETECTED Final   Streptococcus pyogenes NOT DETECTED NOT DETECTED Final   A.calcoaceticus-baumannii NOT DETECTED NOT DETECTED Final   Bacteroides fragilis NOT DETECTED NOT DETECTED Final   Enterobacterales NOT DETECTED NOT DETECTED Final   Enterobacter cloacae complex NOT DETECTED NOT DETECTED Final   Escherichia coli NOT DETECTED NOT DETECTED Final   Klebsiella aerogenes NOT DETECTED NOT DETECTED Final   Klebsiella oxytoca NOT DETECTED NOT DETECTED Final   Klebsiella pneumoniae NOT DETECTED NOT DETECTED Final   Proteus  species NOT DETECTED NOT DETECTED Final   Salmonella species NOT DETECTED NOT DETECTED Final   Serratia marcescens NOT DETECTED NOT DETECTED Final   Haemophilus influenzae NOT DETECTED NOT DETECTED Final   Neisseria meningitidis NOT DETECTED NOT DETECTED Final   Pseudomonas aeruginosa NOT DETECTED NOT DETECTED Final   Stenotrophomonas maltophilia NOT DETECTED NOT DETECTED Final   Candida albicans NOT DETECTED NOT DETECTED Final   Candida auris NOT DETECTED NOT DETECTED Final   Candida glabrata NOT DETECTED NOT DETECTED Final   Candida krusei NOT DETECTED NOT DETECTED Final   Candida parapsilosis NOT DETECTED NOT DETECTED Final   Candida tropicalis NOT DETECTED NOT DETECTED Final   Cryptococcus neoformans/gattii NOT DETECTED NOT DETECTED Final   Methicillin resistance mecA/C DETECTED (A) NOT DETECTED Final    Comment: CRITICAL RESULT CALLED TO, READ BACK BY AND VERIFIED WITH: PHARMD Phillips Climes [id number] AT 1116 BY EC Performed at [facility], [address], [place], [place] [address]   Blood Culture (routine x 2)     Status: None (Preliminary result)   Collection Time: 06/13/23  1:03 PM   Specimen: BLOOD  Result Value Ref Range Status   Specimen Description BLOOD LEFT ANTECUBITAL  Final   Special Requests   Final    BOTTLES DRAWN AEROBIC AND ANAEROBIC Blood Culture adequate volume   Culture   Final    NO GROWTH 4 DAYS Performed at [facility], [address], [place], [place] [address]  Report Status PENDING  Incomplete  Urine Culture     Status: Abnormal   Collection Time: 06/13/23  5:14 PM   Specimen: Urine, Clean Catch  Result Value Ref Range Status   Specimen Description URINE, CLEAN CATCH  Final   Special Requests   Final    NONE Reflexed from [id number] Performed at [facility], [address] [place], [place] [address]    Culture >=100,000 COLONIES/mL PSEUDOMONAS AERUGINOSA (A)  Final   Report Status 06/17/2023 FINAL  Final   Organism ID, Bacteria  PSEUDOMONAS AERUGINOSA (A)  Final      Susceptibility   Pseudomonas aeruginosa - MIC*    CEFTAZIDIME 4 SENSITIVE Sensitive     CIPROFLOXACIN 0.5 SENSITIVE Sensitive     GENTAMICIN 4 SENSITIVE Sensitive     IMIPENEM 1 SENSITIVE Sensitive     PIP/TAZO 32 INTERMEDIATE Intermediate ug/mL    CEFEPIME 4 SENSITIVE Sensitive     * >=100,000 COLONIES/mL PSEUDOMONAS AERUGINOSA  MRSA Next Gen by PCR, Nasal     Status: None   Collection Time: 06/13/23  8:47 PM   Specimen: Nasal Mucosa; Nasal Swab  Result Value Ref Range Status   MRSA by PCR Next Gen NOT DETECTED NOT DETECTED Final    Comment: (NOTE) The GeneXpert MRSA Assay (FDA approved for NASAL specimens only), is one component of a comprehensive MRSA colonization surveillance program. It is not intended to diagnose MRSA infection nor to guide or monitor treatment for MRSA infections. Test performance is not FDA approved in patients less than 48 years old. Performed at [facility], [address] [place], [place] [address]   Culture, blood (Routine X 2) w Reflex to ID Panel     Status: None (Preliminary result)   Collection Time: 06/14/23  3:50 PM   Specimen: BLOOD LEFT ARM  Result Value Ref Range Status   Specimen Description BLOOD LEFT ARM  Final   Special Requests   Final    BOTTLES DRAWN AEROBIC AND ANAEROBIC Blood Culture adequate volume   Culture   Final    NO GROWTH 3 DAYS Performed at [facility], [address] [place], [place] [address]    Report Status PENDING  Incomplete  Culture, blood (Routine X 2) w Reflex to ID Panel     Status: None (Preliminary result)   Collection Time: 06/14/23  3:51 PM   Specimen: BLOOD RIGHT ARM  Result Value Ref Range Status   Specimen Description BLOOD RIGHT ARM  Final   Special Requests   Final    BOTTLES DRAWN AEROBIC AND ANAEROBIC Blood Culture adequate volume   Culture   Final    NO GROWTH 3 DAYS Performed at [facility], [address] [place], [place]  [address]    Report Status PENDING  Incomplete     Time coordinating discharge: Over 35 minutes  SIGNED:   Marinda Elk, DO [facility] 06/18/2023, 8:03 AM Pager   If 7PM-7AM, please contact night-coverage [email or web address]

## 2023-06-18 NOTE — Progress Notes (Incomplete)
 [facility] PROGRESS NOTE    Progress Note  [name]  ZOX:[id number] DOB: 1970-04-30 DOA: 06/13/2023 PCP: [facility]     Brief Narrative:   [name] is an 53 y.o. 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  Assessment/Plan:   Acute metabolic encephalopathy: Etiology remains unclear patient improved with supportive care and antibiotics. Was weaned off narcotics given at risk of polypharmacy.  Rule out UTI/rule out wound infection present on admission No evidence of sepsis on admission. Ortho recommended conservative management. Was treated with IV cefepime due to his history of prior Pseudomonas and Citrobacter.  Multiple sclerosis (HCC) Noted.  Anemia of chronic disease in the setting of chronic pressure ulcer Noted.  Hypoalbuminemia/moderate protein caloric malnutrition Albumin is 2.5.  Obesity: Noted.   Open wound of left ankle/multiple sacral decubitus ulcer present on admission/ Stage 4 decubitus ulcer (HCC): Follow-up with wound care as an outpatient. Patient to relates at home open use she uses meloxicam. Has been weaning off narcotics. RN Pressure Injury Documentation: Pressure Injury 06/15/23 Buttocks Right;Upper Stage 4 - Full thickness tissue loss with exposed bone, tendon or muscle. (Active)  06/15/23 1700  Location: Buttocks  Location Orientation: Right;Upper  Staging: Stage 4 - Full thickness tissue loss with exposed bone, tendon or muscle.  Wound Description (Comments):   Present on Admission: Yes  Dressing Type Foam - Lift dressing to assess site every shift 06/17/23 2019    Estimated body mass index is 45.73 kg/m as calculated from the following:   Height as of this  encounter: 5\' 2"  (1.575 m).   Weight as of this encounter: 113.4 kg. Malnutrition Type:      Malnutrition Characteristics:      Nutrition Interventions:       DVT prophylaxis: *** Family Communication:*** Status is: Inpatient {Inpatient:23812}    Code Status:     Code Status Orders  (From admission, onward)           Start     Ordered   06/13/23 1951  Full code  Continuous       Question:  By:  Answer:  Consent: discussion documented in EHR   06/13/23 1950           Code Status History     Date Active Date Inactive Code Status Order ID Comments User Context   02/18/2021 2306 02/22/2021 1913 Full Code [id number]  Eliezer Champagne, NP ED   01/10/2021 0320 01/10/2021 2343 Partial Code [id number]  Luiz Iron, NP Inpatient   01/09/2021 0204 01/10/2021 0320 Full Code [id number]  Darlin Drop, DO ED   12/12/2020 1542 12/25/2020 2034 Full Code [id number]  Verdene Lennert, MD ED   11/12/2020 0812 11/13/2020 1955 Full Code [id number]  Eliezer Bottom, MD ED   01/26/2019 0054 02/02/2019 1840 Full Code [id number]  Arvilla Market, DO ED   06/10/2013 1611 01/25/2019 1627 Full Code [id number]  Margit Hanks, MD Outpatient   03/30/2013 1651 04/02/2013 2020 Full Code [id number]  Toni Arthurs, MD Inpatient   08/25/2012 0956 08/26/2012 1509 Full Code [id number]  Jerald Kief, MD ED         IV Access:   Peripheral IV   Procedures and diagnostic studies:   No results found.   Medical Consultants:  None.   Subjective:    [name]   Objective:    Vitals:   06/17/23 1751 06/17/23 2019 06/18/23 0417 06/18/23 0712  BP: (!) 97/56 108/69 121/70 128/76  Pulse: 97 92 100 97  Resp: 16 18 18    Temp: 98.1 F (36.7 C) 97.8 F (36.6 C) 98.7 F (37.1 C) 98.8 F (37.1 C)  TempSrc: Oral Oral Oral Oral  SpO2: 98% 99% 98% 100%  Weight:      Height:       SpO2: 100 %   Intake/Output Summary (Last 24 hours) at 06/18/2023 0748 Last data filed at  06/18/2023 0416 Gross per 24 hour  Intake 1000 ml  Output 1310 ml  Net -310 ml   Filed Weights   06/13/23 1300  Weight: 113.4 kg    Exam: General exam: In no acute distress. Respiratory system: Good air movement and clear to auscultation. Cardiovascular system: S1 & S2 heard, RRR. No JVD, murmurs, rubs, gallops or clicks.  Gastrointestinal system: Abdomen is nondistended, soft and nontender.  Central nervous system: Alert and oriented. No focal neurological deficits. Extremities: No pedal edema. Skin: No rashes, lesions or ulcers Psychiatry: Judgement and insight appear normal. Mood & affect appropriate.    Data Reviewed:    Labs: Basic Metabolic Panel: Recent Labs  Lab 06/13/23 1300 06/14/23 0515  NA 141 141  K 3.8 3.9  CL 107 106  CO2 23 23  GLUCOSE 109* 100*  BUN 19 14  CREATININE 0.56 0.48  CALCIUM 8.7* 9.2  MG  --  1.6*  PHOS  --  2.6   GFR Estimated Creatinine Clearance: 95.7 mL/min (by C-G formula based on SCr of 0.48 mg/dL). Liver Function Tests: Recent Labs  Lab 06/13/23 1300  AST 21  ALT 12  ALKPHOS 82  BILITOT 0.3  PROT 7.2  ALBUMIN 2.5*   No results for input(s): "LIPASE", "AMYLASE" in the last 168 hours. No results for input(s): "AMMONIA" in the last 168 hours. Coagulation profile Recent Labs  Lab 06/13/23 1300  INR 1.1   COVID-19 Labs  No results for input(s): "DDIMER", "FERRITIN", "LDH", "CRP" in the last 72 hours.  Lab Results  Component Value Date   SARSCOV2NAA NEGATIVE 02/19/2021   SARSCOV2NAA POSITIVE (A) 01/09/2021   SARSCOV2NAA NEGATIVE 12/24/2020   SARSCOV2NAA NEGATIVE 12/12/2020    CBC: Recent Labs  Lab 06/13/23 1300 06/14/23 0515  WBC 6.7 6.2  NEUTROABS 4.0  --   HGB 10.3* 10.9*  HCT 35.8* 37.0  MCV 86.7 84.5  PLT 245 262   Cardiac Enzymes: No results for input(s): "CKTOTAL", "CKMB", "CKMBINDEX", "TROPONINI" in the last 168 hours. BNP (last 3 results) No results for input(s): "PROBNP" in the last 8760  hours. CBG: No results for input(s): "GLUCAP" in the last 168 hours. D-Dimer: No results for input(s): "DDIMER" in the last 72 hours. Hgb A1c: No results for input(s): "HGBA1C" in the last 72 hours. Lipid Profile: No results for input(s): "CHOL", "HDL", "LDLCALC", "TRIG", "CHOLHDL", "LDLDIRECT" in the last 72 hours. Thyroid function studies: No results for input(s): "TSH", "T4TOTAL", "T3FREE", "THYROIDAB" in the last 72 hours.  Invalid input(s): "FREET3" Anemia work up: No results for input(s): "VITAMINB12", "FOLATE", "FERRITIN", "TIBC", "IRON", "RETICCTPCT" in the last 72 hours. Sepsis Labs: Recent Labs  Lab 06/13/23 1300 06/13/23 1350 06/13/23 1544 06/14/23 0515  WBC 6.7  --   --  6.2  LATICACIDVEN  --  1.7 1.4  --    Microbiology Recent Results (from the past  240 hours)  Blood Culture (routine x 2)     Status: Abnormal   Collection Time: 06/13/23  1:00 PM   Specimen: BLOOD  Result Value Ref Range Status   Specimen Description BLOOD RIGHT ANTECUBITAL  Final   Special Requests   Final    BOTTLES DRAWN AEROBIC AND ANAEROBIC Blood Culture adequate volume   Culture  Setup Time   Final    GRAM POSITIVE RODS ANAEROBIC BOTTLE ONLY CRITICAL RESULT CALLED TO, READ BACK BY AND VERIFIED WITH: PHARMD G ABBOTT 06/14/2023 @ 0354 BY AB GRAM POSITIVE COCCI AEROBIC BOTTLE ONLY CRITICAL RESULT CALLED TO, READ BACK BY AND VERIFIED WITH: PHARMD THUY DANG [id number] AT 1116 BY EC    Culture (A)  Final    STAPHYLOCOCCUS EPIDERMIDIS THE SIGNIFICANCE OF ISOLATING THIS ORGANISM FROM A SINGLE SET OF BLOOD CULTURES WHEN MULTIPLE SETS ARE DRAWN IS UNCERTAIN. PLEASE NOTIFY THE MICROBIOLOGY DEPARTMENT WITHIN ONE WEEK IF SPECIATION AND SENSITIVITIES ARE REQUIRED. CLOSTRIDIUM PERFRINGENS Standardized susceptibility testing for this organism is not available. Performed at [facility], [address] [place], [place] [address]    Report Status 06/16/2023 FINAL  Final  Blood Culture ID Panel  (Reflexed)     Status: Abnormal   Collection Time: 06/13/23  1:00 PM  Result Value Ref Range Status   Enterococcus faecalis NOT DETECTED NOT DETECTED Final   Enterococcus Faecium NOT DETECTED NOT DETECTED Final   Listeria monocytogenes NOT DETECTED NOT DETECTED Final   Staphylococcus species DETECTED (A) NOT DETECTED Final    Comment: CRITICAL RESULT CALLED TO, READ BACK BY AND VERIFIED WITH: PHARMD THUY DANG [id number] AT 1116 BY EC    Staphylococcus aureus (BCID) NOT DETECTED NOT DETECTED Final   Staphylococcus epidermidis DETECTED (A) NOT DETECTED Final    Comment: Methicillin (oxacillin) resistant coagulase negative staphylococcus. Possible blood culture contaminant (unless isolated from more than one blood culture draw or clinical case suggests pathogenicity). No antibiotic treatment is indicated for blood  culture contaminants. CRITICAL RESULT CALLED TO, READ BACK BY AND VERIFIED WITH: PHARMD THUY DANG [id number] AT 1116 BY EC    Staphylococcus lugdunensis NOT DETECTED NOT DETECTED Final   Streptococcus species NOT DETECTED NOT DETECTED Final   Streptococcus agalactiae NOT DETECTED NOT DETECTED Final   Streptococcus pneumoniae NOT DETECTED NOT DETECTED Final   Streptococcus pyogenes NOT DETECTED NOT DETECTED Final   A.calcoaceticus-baumannii NOT DETECTED NOT DETECTED Final   Bacteroides fragilis NOT DETECTED NOT DETECTED Final   Enterobacterales NOT DETECTED NOT DETECTED Final   Enterobacter cloacae complex NOT DETECTED NOT DETECTED Final   Escherichia coli NOT DETECTED NOT DETECTED Final   Klebsiella aerogenes NOT DETECTED NOT DETECTED Final   Klebsiella oxytoca NOT DETECTED NOT DETECTED Final   Klebsiella pneumoniae NOT DETECTED NOT DETECTED Final   Proteus species NOT DETECTED NOT DETECTED Final   Salmonella species NOT DETECTED NOT DETECTED Final   Serratia marcescens NOT DETECTED NOT DETECTED Final   Haemophilus influenzae NOT DETECTED NOT DETECTED Final   Neisseria  meningitidis NOT DETECTED NOT DETECTED Final   Pseudomonas aeruginosa NOT DETECTED NOT DETECTED Final   Stenotrophomonas maltophilia NOT DETECTED NOT DETECTED Final   Candida albicans NOT DETECTED NOT DETECTED Final   Candida auris NOT DETECTED NOT DETECTED Final   Candida glabrata NOT DETECTED NOT DETECTED Final   Candida krusei NOT DETECTED NOT DETECTED Final   Candida parapsilosis NOT DETECTED NOT DETECTED Final   Candida tropicalis NOT DETECTED NOT DETECTED Final   Cryptococcus neoformans/gattii NOT  DETECTED NOT DETECTED Final   Methicillin resistance mecA/C DETECTED (A) NOT DETECTED Final    Comment: CRITICAL RESULT CALLED TO, READ BACK BY AND VERIFIED WITH: PHARMD Phillips Climes [id number] AT 1116 BY EC Performed at [facility], [address] [place], [place] [address]   Blood Culture (routine x 2)     Status: None (Preliminary result)   Collection Time: 06/13/23  1:03 PM   Specimen: BLOOD  Result Value Ref Range Status   Specimen Description BLOOD LEFT ANTECUBITAL  Final   Special Requests   Final    BOTTLES DRAWN AEROBIC AND ANAEROBIC Blood Culture adequate volume   Culture   Final    NO GROWTH 4 DAYS Performed at [facility], [address], [place], [place] [address]    Report Status PENDING  Incomplete  Urine Culture     Status: Abnormal   Collection Time: 06/13/23  5:14 PM   Specimen: Urine, Clean Catch  Result Value Ref Range Status   Specimen Description URINE, CLEAN CATCH  Final   Special Requests   Final    NONE Reflexed from [id number] Performed at [facility], [address], [place], [place] [address]    Culture >=100,000 COLONIES/mL PSEUDOMONAS AERUGINOSA (A)  Final   Report Status 06/17/2023 FINAL  Final   Organism ID, Bacteria PSEUDOMONAS AERUGINOSA (A)  Final      Susceptibility   Pseudomonas aeruginosa - MIC*    CEFTAZIDIME 4 SENSITIVE Sensitive     CIPROFLOXACIN 0.5 SENSITIVE Sensitive     GENTAMICIN 4 SENSITIVE Sensitive     IMIPENEM 1  SENSITIVE Sensitive     PIP/TAZO 32 INTERMEDIATE Intermediate ug/mL    CEFEPIME 4 SENSITIVE Sensitive     * >=100,000 COLONIES/mL PSEUDOMONAS AERUGINOSA  MRSA Next Gen by PCR, Nasal     Status: None   Collection Time: 06/13/23  8:47 PM   Specimen: Nasal Mucosa; Nasal Swab  Result Value Ref Range Status   MRSA by PCR Next Gen NOT DETECTED NOT DETECTED Final    Comment: (NOTE) The GeneXpert MRSA Assay (FDA approved for NASAL specimens only), is one component of a comprehensive MRSA colonization surveillance program. It is not intended to diagnose MRSA infection nor to guide or monitor treatment for MRSA infections. Test performance is not FDA approved in patients less than 15 years old. Performed at [facility], [address], [place], [place] [address]   Culture, blood (Routine X 2) w Reflex to ID Panel     Status: None (Preliminary result)   Collection Time: 06/14/23  3:50 PM   Specimen: BLOOD LEFT ARM  Result Value Ref Range Status   Specimen Description BLOOD LEFT ARM  Final   Special Requests   Final    BOTTLES DRAWN AEROBIC AND ANAEROBIC Blood Culture adequate volume   Culture   Final    NO GROWTH 3 DAYS Performed at [facility], [address], [place], [place] [address]    Report Status PENDING  Incomplete  Culture, blood (Routine X 2) w Reflex to ID Panel     Status: None (Preliminary result)   Collection Time: 06/14/23  3:51 PM   Specimen: BLOOD RIGHT ARM  Result Value Ref Range Status   Specimen Description BLOOD RIGHT ARM  Final   Special Requests   Final    BOTTLES DRAWN AEROBIC AND ANAEROBIC Blood Culture adequate volume   Culture   Final    NO  GROWTH 3 DAYS Performed at [facility], [address], [place], [place] [address]    Report Status PENDING  Incomplete     Medications:    busPIRone  5 mg Oral Daily   carvedilol  3.125 mg Oral BID WC   gabapentin  300 mg Oral TID WC   gabapentin  600 mg Oral QHS   heparin  5,000 Units  Subcutaneous Q8H   liver oil-zinc oxide   Topical BID   Continuous Infusions:  ceFEPime (MAXIPIME) IV 2 g (06/18/23 0416)      LOS: 5 days   Marinda Elk  [facility]  06/18/2023, 7:48 AM

## 2023-06-19 LAB — CULTURE, BLOOD (ROUTINE X 2)
Culture: NO GROWTH
Culture: NO GROWTH
Special Requests: ADEQUATE
Special Requests: ADEQUATE

## 2023-06-20 ENCOUNTER — Other Ambulatory Visit (HOSPITAL_COMMUNITY): Payer: Self-pay

## 2023-06-26 ENCOUNTER — Other Ambulatory Visit (HOSPITAL_COMMUNITY): Payer: Self-pay

## 2023-07-08 ENCOUNTER — Encounter (HOSPITAL_BASED_OUTPATIENT_CLINIC_OR_DEPARTMENT_OTHER): Payer: Medicare PPO | Attending: Internal Medicine | Admitting: Internal Medicine

## 2023-07-08 DIAGNOSIS — L89324 Pressure ulcer of left buttock, stage 4: Secondary | ICD-10-CM | POA: Diagnosis not present

## 2023-07-08 DIAGNOSIS — L89314 Pressure ulcer of right buttock, stage 4: Secondary | ICD-10-CM | POA: Insufficient documentation

## 2023-07-08 DIAGNOSIS — M4628 Osteomyelitis of vertebra, sacral and sacrococcygeal region: Secondary | ICD-10-CM

## 2023-07-08 DIAGNOSIS — G35 Multiple sclerosis: Secondary | ICD-10-CM | POA: Insufficient documentation

## 2023-10-17 ENCOUNTER — Encounter (HOSPITAL_COMMUNITY): Payer: Self-pay

## 2023-10-17 ENCOUNTER — Emergency Department (HOSPITAL_COMMUNITY)

## 2023-10-17 ENCOUNTER — Inpatient Hospital Stay (HOSPITAL_COMMUNITY)
Admission: EM | Admit: 2023-10-17 | Discharge: 2023-10-27 | DRG: 391 | Disposition: A | Discharge status: Home / Self Care | Attending: Family Medicine | Admitting: Family Medicine

## 2023-10-17 ENCOUNTER — Other Ambulatory Visit: Payer: Self-pay

## 2023-10-17 DIAGNOSIS — G35 Multiple sclerosis: Secondary | ICD-10-CM | POA: Diagnosis present

## 2023-10-17 DIAGNOSIS — G8220 Paraplegia, unspecified: Secondary | ICD-10-CM | POA: Diagnosis present

## 2023-10-17 DIAGNOSIS — Z7401 Bed confinement status: Secondary | ICD-10-CM

## 2023-10-17 DIAGNOSIS — R6520 Severe sepsis without septic shock: Secondary | ICD-10-CM | POA: Diagnosis not present

## 2023-10-17 DIAGNOSIS — L8990 Pressure ulcer of unspecified site, unspecified stage: Secondary | ICD-10-CM | POA: Diagnosis present

## 2023-10-17 DIAGNOSIS — E876 Hypokalemia: Secondary | ICD-10-CM | POA: Diagnosis present

## 2023-10-17 DIAGNOSIS — K5641 Fecal impaction: Secondary | ICD-10-CM | POA: Diagnosis present

## 2023-10-17 DIAGNOSIS — I959 Hypotension, unspecified: Secondary | ICD-10-CM | POA: Diagnosis not present

## 2023-10-17 DIAGNOSIS — A419 Sepsis, unspecified organism: Secondary | ICD-10-CM | POA: Diagnosis not present

## 2023-10-17 DIAGNOSIS — K7460 Unspecified cirrhosis of liver: Secondary | ICD-10-CM | POA: Diagnosis present

## 2023-10-17 DIAGNOSIS — B965 Pseudomonas (aeruginosa) (mallei) (pseudomallei) as the cause of diseases classified elsewhere: Secondary | ICD-10-CM | POA: Diagnosis present

## 2023-10-17 DIAGNOSIS — Z87891 Personal history of nicotine dependence: Secondary | ICD-10-CM

## 2023-10-17 DIAGNOSIS — L89324 Pressure ulcer of left buttock, stage 4: Secondary | ICD-10-CM | POA: Diagnosis present

## 2023-10-17 DIAGNOSIS — Z79899 Other long term (current) drug therapy: Secondary | ICD-10-CM

## 2023-10-17 DIAGNOSIS — N136 Pyonephrosis: Secondary | ICD-10-CM | POA: Diagnosis present

## 2023-10-17 DIAGNOSIS — I808 Phlebitis and thrombophlebitis of other sites: Secondary | ICD-10-CM | POA: Diagnosis not present

## 2023-10-17 DIAGNOSIS — E872 Acidosis, unspecified: Secondary | ICD-10-CM | POA: Diagnosis present

## 2023-10-17 DIAGNOSIS — K5289 Other specified noninfective gastroenteritis and colitis: Principal | ICD-10-CM | POA: Diagnosis present

## 2023-10-17 DIAGNOSIS — L89314 Pressure ulcer of right buttock, stage 4: Secondary | ICD-10-CM | POA: Diagnosis present

## 2023-10-17 DIAGNOSIS — K5900 Constipation, unspecified: Secondary | ICD-10-CM | POA: Diagnosis present

## 2023-10-17 DIAGNOSIS — B952 Enterococcus as the cause of diseases classified elsewhere: Secondary | ICD-10-CM | POA: Diagnosis present

## 2023-10-17 DIAGNOSIS — R7989 Other specified abnormal findings of blood chemistry: Secondary | ICD-10-CM | POA: Diagnosis present

## 2023-10-17 DIAGNOSIS — K766 Portal hypertension: Secondary | ICD-10-CM | POA: Diagnosis present

## 2023-10-17 DIAGNOSIS — R1310 Dysphagia, unspecified: Secondary | ICD-10-CM | POA: Diagnosis present

## 2023-10-17 DIAGNOSIS — D509 Iron deficiency anemia, unspecified: Secondary | ICD-10-CM | POA: Diagnosis present

## 2023-10-17 LAB — COMPREHENSIVE METABOLIC PANEL WITH GFR
ALT: 11 U/L (ref 0–44)
AST: 22 U/L (ref 15–41)
Albumin: 2.9 g/dL — ABNORMAL LOW (ref 3.5–5.0)
Alkaline Phosphatase: 89 U/L (ref 38–126)
Anion gap: 11 (ref 5–15)
BUN: 29 mg/dL — ABNORMAL HIGH (ref 6–20)
CO2: 23 mmol/L (ref 22–32)
Calcium: 9 mg/dL (ref 8.9–10.3)
Chloride: 107 mmol/L (ref 98–111)
Creatinine, Ser: 0.61 mg/dL (ref 0.44–1.00)
GFR, Estimated: >60 mL/min (ref >60)
Glucose, Bld: 104 mg/dL — ABNORMAL HIGH (ref 70–99)
Potassium: 4 mmol/L (ref 3.5–5.1)
Sodium: 141 mmol/L (ref 135–145)
Total Bilirubin: 0.6 mg/dL (ref 0.0–1.2)
Total Protein: 7 g/dL (ref 6.5–8.1)

## 2023-10-17 LAB — CBC WITH DIFFERENTIAL/PLATELET
Abs Immature Granulocytes: 0.02 K/uL (ref 0.00–0.07)
Basophils Absolute: 0 K/uL (ref 0.0–0.1)
Basophils Relative: 0 %
Eosinophils Absolute: 0.1 K/uL (ref 0.0–0.5)
Eosinophils Relative: 1 %
HCT: 33 % — ABNORMAL LOW (ref 36.0–46.0)
Hemoglobin: 9.1 g/dL — ABNORMAL LOW (ref 12.0–15.0)
Immature Granulocytes: 0 %
Lymphocytes Relative: 26 %
Lymphs Abs: 1.9 K/uL (ref 0.7–4.0)
MCH: 22.4 pg — ABNORMAL LOW (ref 26.0–34.0)
MCHC: 27.6 g/dL — ABNORMAL LOW (ref 30.0–36.0)
MCV: 81.1 fL (ref 80.0–100.0)
Monocytes Absolute: 0.4 K/uL (ref 0.1–1.0)
Monocytes Relative: 6 %
Neutro Abs: 4.9 K/uL (ref 1.7–7.7)
Neutrophils Relative %: 67 %
Platelets: 232 K/uL (ref 150–400)
RBC: 4.07 MIL/uL (ref 3.87–5.11)
RDW: 17.5 % — ABNORMAL HIGH (ref 11.5–15.5)
WBC: 7.4 K/uL (ref 4.0–10.5)
nRBC: 0 % (ref 0.0–0.2)

## 2023-10-17 LAB — PROTIME-INR
INR: 1 (ref 0.8–1.2)
Prothrombin Time: 14.2 s (ref 11.4–15.2)

## 2023-10-17 LAB — I-STAT CG4 LACTIC ACID, ED: Lactic Acid, Venous: 1.9 mmol/L (ref 0.5–1.9)

## 2023-10-17 MED ORDER — LACTATED RINGERS IV BOLUS (SEPSIS)
1000.0000 mL | Freq: Once | INTRAVENOUS | Status: AC
  Administered 2023-10-17: 1000 mL via INTRAVENOUS

## 2023-10-17 MED ORDER — SODIUM CHLORIDE 0.9 % IV SOLN
1.0000 g | Freq: Once | INTRAVENOUS | Status: AC
  Administered 2023-10-18: 1 g via INTRAVENOUS
  Filled 2023-10-17: qty 10

## 2023-10-17 MED ORDER — IOHEXOL 350 MG/ML SOLN
75.0000 mL | Freq: Once | INTRAVENOUS | Status: AC | PRN
  Administered 2023-10-17: 75 mL via INTRAVENOUS

## 2023-10-17 NOTE — ED Triage Notes (Signed)
 Patient is coming from home via [facility]. UTI symptoms include burning with urination, urgency, and urinary retention. Hx of same. Patient has a sacral wound that is being treated by a wound care team. Patient is bed bound at baseline. Receive of LR by EMS. Hx of MS EMS VS 99.57F  108 HR 99% RA 22 RR 34 CO2 124/92 BP

## 2023-10-17 NOTE — ED Provider Notes (Signed)
 [place] [facility]PART[facility] AT [facility] Provider Note   CSN: [id number] Arrival date & time: 10/17/23  2123     Patient presents with: Fever   [name] is a 53 y.o. female.  She is a very poor historian.  She is brought in by ambulance from home for burning with urination and fever.  She thinks symptoms started yesterday.  She has MS and a chronic wound on buttocks.  Lives at home and mother helps care for her.  She is unable to ambulate or use her legs.   The history is provided by the patient and the EMS personnel.  Fever Temp source:  Unable to specify Onset quality:  Unable to specify Associated symptoms: dysuria   Associated symptoms: no chest pain, no cough and no vomiting        Prior to Admission medications   Medication Sig Start Date End Date Taking? Authorizing Provider  acetaminophen  (TYLENOL ) 500 MG tablet Take 1,000 mg by mouth in the morning, at noon, and at bedtime.    [provider]  baclofen  (LIORESAL ) 20 MG tablet Take 20 mg by mouth 4 (four) times daily. 11/05/18   [provider]  busPIRone  (BUSPAR ) 5 MG tablet Take 5 mg by mouth daily. 05/12/21   [provider]  carvedilol  (COREG ) 3.125 MG tablet Take 3.125 mg by mouth 2 (two) times daily with a meal.    [provider]  gabapentin  (NEURONTIN ) 300 MG capsule Take 300-600 mg by mouth See admin instructions. 300 mg  morning, noon and dinner and 2 capsules (600mg ) at bedtime. 12/14/18   [provider]  meloxicam  (MOBIC ) 7.5 MG tablet Take 7.5 mg by mouth daily. 11/05/18   [provider]  mineral oil-hydrophilic petrolatum (AQUAPHOR) ointment Apply topically as needed.    [provider]  Multiple Vitamins-Minerals (ADULT GUMMY PO) Take by mouth daily.    [provider]  omeprazole (PRILOSEC) 40 MG capsule Take 40 mg by mouth daily.    [provider]  traMADol  (ULTRAM ) 50 MG tablet Take 1 tablet (50 mg total)  by mouth every 12 (twelve) hours as needed for moderate pain. Patient taking differently: Take 50 mg by mouth every 12 (twelve) hours as needed for moderate pain. 02/22/21   Ghimire, Donalda HERO, MD  Zinc  Oxide (TRIPLE PASTE) 12.8 % ointment Apply topically 2 (two) times daily. Apply AROUND sacral wound area. Then follow Dakin's order. Patient taking differently: Apply topically as needed for irritation. 12/25/20   Nguyen, Julie, DO    Allergies: Silver, Tizanidine, and Tomato    Review of Systems  Constitutional:  Positive for fever.  Respiratory:  Negative for cough.   Cardiovascular:  Negative for chest pain.  Gastrointestinal:  Negative for vomiting.  Genitourinary:  Positive for dysuria.    Updated Vital Signs BP 116/62 (BP Location: Right Arm)   Pulse (!) 104   Temp 99.3 F (37.4 C) (Oral)   Resp 16   SpO2 100%   Physical Exam Vitals and nursing note reviewed.  Constitutional:      General: She is not in acute distress.    Appearance: Normal appearance. She is well-developed.  HENT:     Head: Normocephalic and atraumatic.  Eyes:     Conjunctiva/sclera: Conjunctivae normal.  Cardiovascular:     Rate and Rhythm: Regular rhythm. Tachycardia present.     Heart sounds: No murmur heard. Pulmonary:     Effort: Pulmonary effort is normal. No respiratory  distress.     Breath sounds: Normal breath sounds. No stridor. No wheezing.  Abdominal:     Palpations: Abdomen is soft.     Tenderness: There is no abdominal tenderness. There is no guarding or rebound.  Musculoskeletal:        General: No tenderness.     Cervical back: Neck supple.     Comments: She has general atrophy of both of her lower legs.  There is edema of her feet.  She has a chronic deep wound over her right buttock.  No significant surrounding cellulitis.  Skin:    General: Skin is warm and dry.  Neurological:     Mental Status: She is alert.     GCS: GCS eye subscore is 4. GCS verbal subscore is 5. GCS motor  subscore is 6.     Comments: She is generally weak lower extremities greater than upper extremities.     (all labs ordered are listed, but only abnormal results are displayed) Labs Reviewed  COMPREHENSIVE METABOLIC PANEL WITH GFR - Abnormal; Notable for the following components:      Result Value   Glucose, Bld 104 (*)    BUN 29 (*)    Albumin 2.9 (*)    All other components within normal limits  CBC WITH DIFFERENTIAL/PLATELET - Abnormal; Notable for the following components:   Hemoglobin 9.1 (*)    HCT 33.0 (*)    MCH 22.4 (*)    MCHC 27.6 (*)    RDW 17.5 (*)    All other components within normal limits  CBC WITH DIFFERENTIAL/PLATELET - Abnormal; Notable for the following components:   RBC 3.77 (*)    Hemoglobin 8.5 (*)    HCT 30.0 (*)    MCV 79.6 (*)    MCH 22.5 (*)    MCHC 28.3 (*)    RDW 17.6 (*)    All other components within normal limits  COMPREHENSIVE METABOLIC PANEL WITH GFR - Abnormal; Notable for the following components:   BUN 21 (*)    Albumin 2.6 (*)    All other components within normal limits  MAGNESIUM  - Abnormal; Notable for the following components:   Magnesium  1.6 (*)    All other components within normal limits  CULTURE, BLOOD (ROUTINE X 2)  CULTURE, BLOOD (ROUTINE X 2)  PROTIME-INR  URINALYSIS, W/ REFLEX TO CULTURE (INFECTION SUSPECTED)  I-STAT CG4 LACTIC ACID, ED  I-STAT CG4 LACTIC ACID, ED    EKG: EKG Interpretation Date/Time:  Friday October 17 2023 21:42:18 EDT Ventricular Rate:  107 PR Interval:  145 QRS Duration:  82 QT Interval:  354 QTC Calculation: 473 R Axis:   -24  Text Interpretation: Sinus tachycardia Inferior infarct, old increased rate from prior 4/25 Confirmed by Towana Sharper [id number]) on 10/17/2023 9:58:58 PM  Radiology: CT CHEST ABDOMEN PELVIS W CONTRAST Result Date: 10/18/2023 CLINICAL DATA:  Sepsis and abdominal pain.  Sacral wound EXAM: CT CHEST, ABDOMEN, AND PELVIS WITH CONTRAST TECHNIQUE: Multidetector CT imaging of  the chest, abdomen and pelvis was performed following the standard protocol during bolus administration of intravenous contrast. RADIATION DOSE REDUCTION: This exam was performed according to the departmental dose-optimization program which includes automated exposure control, adjustment of the mA and/or kV according to patient size and/or use of iterative reconstruction technique. CONTRAST:  75mL OMNIPAQUE  IOHEXOL  350 MG/ML SOLN COMPARISON:  CT abdomen and pelvis 02/19/2021. FINDINGS: CT CHEST FINDINGS Cardiovascular: No significant vascular findings. Normal heart size. No pericardial effusion. Mediastinum/Nodes: No enlarged mediastinal,  hilar, or axillary lymph nodes. There is a 1 cm hypodense left thyroid  nodule. There are no enlarged lymph nodes. Esophagus is within normal limits. Lungs/Pleura: Lungs are clear. No pleural effusion or pneumothorax. Musculoskeletal: No chest wall mass or suspicious bone lesions identified. There is mild elevation of the left hemidiaphragm, unchanged. CT ABDOMEN PELVIS FINDINGS Hepatobiliary: There is nodular liver contour. No focal liver lesion identified. Gallbladder and bile ducts are within normal limits. Pancreas: Unremarkable. No pancreatic ductal dilatation or surrounding inflammatory changes. Spleen: Spleen is mild-to-moderately enlarged, unchanged. Adrenals/Urinary Tract: Bilateral renal calculi are present, left greater than right, measuring up to 4 mm. There is mild right-sided hydroureteronephrosis to the level of the distal ureter, likely secondary to compression from the rectum. No obstructing calculus identified. There is no left-sided hydronephrosis. There is no perinephric fluid or stranding. There is a 2.8 cm right renal cyst. The adrenal glands are within normal limits. The bladder is distended, but otherwise within normal limits. Stomach/Bowel: The rectum is markedly dilated and filled with stool measuring up to 14 cm in diameter. There is diffuse rectal wall  thickening with mild surrounding inflammation. No other dilated bowel loops are identified. There is no bowel obstruction. Appendix appears within normal limits. Overall stool burden is large. Vascular/Lymphatic: Aorta and IVC are normal in size. There are atherosclerotic calcifications throughout the aorta. [place] off a GIA varices are present. No enlarged lymph nodes are seen. Reproductive: Status post hysterectomy. No adnexal masses. Other: There is no ascites or focal abdominal wall hernia. Musculoskeletal: Bilateral ischial decubitus ulcerations are present to the level of the bone. This is not significantly changed. No acute fracture identified. Degenerative changes affect the spine. No focal abscess identified. No soft tissue gas. IMPRESSION: 1. Markedly dilated rectum filled with stool with diffuse rectal wall thickening and surrounding inflammation. Findings are compatible with stercoral colitis. 2. Mild right-sided hydroureteronephrosis to the level of the distal ureter, likely secondary to compression from the rectum. 3. Bilateral nonobstructing renal calculi. 4. Findings compatible with cirrhosis and portal hypertension. 5. Stable bilateral ischial decubitus ulcerations extending to the level of the ischia. 6. 1 cm incidental left thyroid  nodule. Not clinically significant; no follow-up imaging recommended (ref: J Am Coll Radiol. 2015 Feb;12(2): 143-50). 7. No acute cardiopulmonary process. Aortic Atherosclerosis (ICD10-I70.0). Electronically Signed   By: Greig Pique M.D.   On: 10/18/2023 00:02   DG Chest Port 1 View Result Date: 10/17/2023 CLINICAL DATA:  Questionable sepsis EXAM: PORTABLE CHEST 1 VIEW COMPARISON:  Chest x-ray 06/13/2023 FINDINGS: There are 2 small adjacent nodular densities in the left perihilar region measuring up to 4 mm, indeterminate. The lungs are otherwise clear. There is no pleural effusion or pneumothorax. The cardiomediastinal silhouette is within normal limits. No acute  fractures are seen. IMPRESSION: Two small adjacent nodular densities in the left perihilar region measuring up to 4 mm, indeterminate. Recommend further evaluation with chest CT. Electronically Signed   By: Greig Pique M.D.   On: 10/17/2023 22:22     Procedures   Medications Ordered in the ED  melatonin tablet 3 mg (has no administration in time range)  ondansetron  (ZOFRAN ) injection 4 mg (has no administration in time range)  lactated ringers  infusion ( Intravenous New Bag/Given 10/18/23 0258)  polyethylene glycol (MIRALAX  / GLYCOLAX ) packet 17 g (has no administration in time range)  naloxone  (NARCAN ) injection 0.4 mg (has no administration in time range)  cefTRIAXone  (ROCEPHIN ) 2 g in sodium chloride  0.9 % 100 mL IVPB (has no  administration in time range)  metroNIDAZOLE  (FLAGYL ) IVPB 500 mg (0 mg Intravenous Stopped 10/18/23 0427)  senna-docusate (Senokot-S) tablet 2 tablet (has no administration in time range)  acetaminophen  (TYLENOL ) tablet 1,000 mg (has no administration in time range)  gabapentin  (NEURONTIN ) capsule 600 mg (has no administration in time range)  pantoprazole  (PROTONIX ) EC tablet 40 mg (has no administration in time range)  traMADol  (ULTRAM ) tablet 50 mg (has no administration in time range)  baclofen  (LIORESAL ) tablet 20 mg (has no administration in time range)  lactated ringers  bolus 1,000 mL (0 mLs Intravenous Stopped 10/18/23 0103)  cefTRIAXone  (ROCEPHIN ) 1 g in sodium chloride  0.9 % 100 mL IVPB (0 g Intravenous Stopped 10/18/23 0045)  iohexol  (OMNIPAQUE ) 350 MG/ML injection 75 mL (75 mLs Intravenous Contrast Given 10/17/23 2345)  sodium phosphate  (FLEET) enema 1 enema (1 enema Rectal Given 10/18/23 0230)    Clinical Course as of 10/18/23 0904  Fri Oct 17, 2023  2356 Patient's care signed out to Dr. Theadore to follow-up on results of urinalysis and CAT scans.  She will need admission to the hospital for further management. [MB]    Clinical Course User Index [MB] Towana Ozell BROCKS, MD                                 Medical Decision Making Amount and/or Complexity of Data Reviewed Labs: ordered. Radiology: ordered.  Risk OTC drugs. Prescription drug management. Decision regarding hospitalization.   This patient complains of fever, burning with urination; this involves an extensive number of treatment Options and is a complaint that carries with it a high risk of complications and morbidity. The differential includes UTI, sepsis, pyelonephritis, renal colic, MS exacerbation  I ordered, reviewed and interpreted labs, which included CBC with normal white count, low hemoglobin, chemistries and LFTs unremarkable, lactate normal, blood culture sent, urinalysis ordered I ordered medication IV fluids and antibiotics and reviewed PMP when indicated. I ordered imaging studies which included CT abdomen and pelvis, chest x-ray and I independently    visualized and interpreted imaging which showed Nodular densities on chest x-ray.  CT pending at time of signout Additional history obtained from EMS Previous records obtained and reviewed in epic including recent discharge summary  Cardiac monitoring reviewed, sinus tachycardia Social determinants considered, no significant barriers Critical Interventions: None  After the interventions stated above, I reevaluated the patient and found patient still to be tachycardic although pressure has been okay Admission and further testing considered, her care was signed out to Dr. Theadore to follow-up on results of urinalysis and CT abdomen and pelvis, CT chest.  Will likely need admission to the hospital for further management due to her comorbid medical issues.     Final diagnoses:  Stercoral colitis    ED Discharge Orders     None          Towana Ozell BROCKS, MD 10/18/23 [phone]

## 2023-10-18 DIAGNOSIS — B952 Enterococcus as the cause of diseases classified elsewhere: Secondary | ICD-10-CM | POA: Diagnosis present

## 2023-10-18 DIAGNOSIS — G8220 Paraplegia, unspecified: Secondary | ICD-10-CM | POA: Diagnosis present

## 2023-10-18 DIAGNOSIS — L89314 Pressure ulcer of right buttock, stage 4: Secondary | ICD-10-CM | POA: Diagnosis present

## 2023-10-18 DIAGNOSIS — N136 Pyonephrosis: Secondary | ICD-10-CM | POA: Diagnosis present

## 2023-10-18 DIAGNOSIS — I808 Phlebitis and thrombophlebitis of other sites: Secondary | ICD-10-CM | POA: Diagnosis not present

## 2023-10-18 DIAGNOSIS — E876 Hypokalemia: Secondary | ICD-10-CM | POA: Diagnosis present

## 2023-10-18 DIAGNOSIS — R338 Other retention of urine: Secondary | ICD-10-CM | POA: Diagnosis not present

## 2023-10-18 DIAGNOSIS — K5289 Other specified noninfective gastroenteritis and colitis: Secondary | ICD-10-CM | POA: Diagnosis present

## 2023-10-18 DIAGNOSIS — K766 Portal hypertension: Secondary | ICD-10-CM | POA: Diagnosis present

## 2023-10-18 DIAGNOSIS — K5909 Other constipation: Secondary | ICD-10-CM | POA: Diagnosis not present

## 2023-10-18 DIAGNOSIS — Z7401 Bed confinement status: Secondary | ICD-10-CM | POA: Diagnosis not present

## 2023-10-18 DIAGNOSIS — Z87891 Personal history of nicotine dependence: Secondary | ICD-10-CM | POA: Diagnosis not present

## 2023-10-18 DIAGNOSIS — E872 Acidosis, unspecified: Secondary | ICD-10-CM | POA: Diagnosis present

## 2023-10-18 DIAGNOSIS — N390 Urinary tract infection, site not specified: Secondary | ICD-10-CM | POA: Diagnosis not present

## 2023-10-18 DIAGNOSIS — A419 Sepsis, unspecified organism: Secondary | ICD-10-CM | POA: Diagnosis not present

## 2023-10-18 DIAGNOSIS — K7460 Unspecified cirrhosis of liver: Secondary | ICD-10-CM | POA: Diagnosis present

## 2023-10-18 DIAGNOSIS — D509 Iron deficiency anemia, unspecified: Secondary | ICD-10-CM | POA: Diagnosis present

## 2023-10-18 DIAGNOSIS — M86659 Other chronic osteomyelitis, unspecified thigh: Secondary | ICD-10-CM | POA: Diagnosis not present

## 2023-10-18 DIAGNOSIS — Z79899 Other long term (current) drug therapy: Secondary | ICD-10-CM | POA: Diagnosis not present

## 2023-10-18 DIAGNOSIS — R6520 Severe sepsis without septic shock: Secondary | ICD-10-CM | POA: Diagnosis not present

## 2023-10-18 DIAGNOSIS — N1330 Unspecified hydronephrosis: Secondary | ICD-10-CM | POA: Diagnosis not present

## 2023-10-18 DIAGNOSIS — I809 Phlebitis and thrombophlebitis of unspecified site: Secondary | ICD-10-CM | POA: Diagnosis not present

## 2023-10-18 DIAGNOSIS — K5641 Fecal impaction: Secondary | ICD-10-CM | POA: Diagnosis present

## 2023-10-18 DIAGNOSIS — R7989 Other specified abnormal findings of blood chemistry: Secondary | ICD-10-CM | POA: Diagnosis present

## 2023-10-18 DIAGNOSIS — B965 Pseudomonas (aeruginosa) (mallei) (pseudomallei) as the cause of diseases classified elsewhere: Secondary | ICD-10-CM | POA: Diagnosis present

## 2023-10-18 DIAGNOSIS — L89300 Pressure ulcer of unspecified buttock, unstageable: Secondary | ICD-10-CM | POA: Diagnosis not present

## 2023-10-18 DIAGNOSIS — R1310 Dysphagia, unspecified: Secondary | ICD-10-CM | POA: Diagnosis present

## 2023-10-18 DIAGNOSIS — R161 Splenomegaly, not elsewhere classified: Secondary | ICD-10-CM | POA: Diagnosis not present

## 2023-10-18 DIAGNOSIS — I959 Hypotension, unspecified: Secondary | ICD-10-CM | POA: Diagnosis not present

## 2023-10-18 DIAGNOSIS — L89324 Pressure ulcer of left buttock, stage 4: Secondary | ICD-10-CM | POA: Diagnosis present

## 2023-10-18 DIAGNOSIS — G35 Multiple sclerosis: Secondary | ICD-10-CM | POA: Diagnosis present

## 2023-10-18 LAB — COMPREHENSIVE METABOLIC PANEL WITH GFR
ALT: 9 U/L (ref 0–44)
AST: 27 U/L (ref 15–41)
Albumin: 2.6 g/dL — ABNORMAL LOW (ref 3.5–5.0)
Alkaline Phosphatase: 80 U/L (ref 38–126)
Anion gap: 10 (ref 5–15)
BUN: 21 mg/dL — ABNORMAL HIGH (ref 6–20)
CO2: 23 mmol/L (ref 22–32)
Calcium: 8.9 mg/dL (ref 8.9–10.3)
Chloride: 107 mmol/L (ref 98–111)
Creatinine, Ser: 0.53 mg/dL (ref 0.44–1.00)
GFR, Estimated: >60 mL/min (ref >60)
Glucose, Bld: 98 mg/dL (ref 70–99)
Potassium: 3.6 mmol/L (ref 3.5–5.1)
Sodium: 140 mmol/L (ref 135–145)
Total Bilirubin: 0.3 mg/dL (ref 0.0–1.2)
Total Protein: 6.7 g/dL (ref 6.5–8.1)

## 2023-10-18 LAB — CBC WITH DIFFERENTIAL/PLATELET
Abs Immature Granulocytes: 0.01 K/uL (ref 0.00–0.07)
Basophils Absolute: 0 K/uL (ref 0.0–0.1)
Basophils Relative: 0 %
Eosinophils Absolute: 0.1 K/uL (ref 0.0–0.5)
Eosinophils Relative: 1 %
HCT: 30 % — ABNORMAL LOW (ref 36.0–46.0)
Hemoglobin: 8.5 g/dL — ABNORMAL LOW (ref 12.0–15.0)
Immature Granulocytes: 0 %
Lymphocytes Relative: 31 %
Lymphs Abs: 2 K/uL (ref 0.7–4.0)
MCH: 22.5 pg — ABNORMAL LOW (ref 26.0–34.0)
MCHC: 28.3 g/dL — ABNORMAL LOW (ref 30.0–36.0)
MCV: 79.6 fL — ABNORMAL LOW (ref 80.0–100.0)
Monocytes Absolute: 0.4 K/uL (ref 0.1–1.0)
Monocytes Relative: 7 %
Neutro Abs: 3.9 K/uL (ref 1.7–7.7)
Neutrophils Relative %: 61 %
Platelets: 224 K/uL (ref 150–400)
RBC: 3.77 MIL/uL — ABNORMAL LOW (ref 3.87–5.11)
RDW: 17.6 % — ABNORMAL HIGH (ref 11.5–15.5)
WBC: 6.5 K/uL (ref 4.0–10.5)
nRBC: 0 % (ref 0.0–0.2)

## 2023-10-18 LAB — SEDIMENTATION RATE: Sed Rate: 39 mm/h — ABNORMAL HIGH (ref 0–22)

## 2023-10-18 LAB — URINALYSIS, W/ REFLEX TO CULTURE (INFECTION SUSPECTED)
Bilirubin Urine: NEGATIVE
Glucose, UA: NEGATIVE mg/dL
Ketones, ur: NEGATIVE mg/dL
Nitrite: POSITIVE — AB
Protein, ur: NEGATIVE mg/dL
Specific Gravity, Urine: 1.02 (ref 1.005–1.030)
WBC, UA: >50 WBC/hpf (ref 0–5)
pH: 6 (ref 5.0–8.0)

## 2023-10-18 LAB — MRSA NEXT GEN BY PCR, NASAL: MRSA by PCR Next Gen: NOT DETECTED

## 2023-10-18 LAB — C-REACTIVE PROTEIN: CRP: 1.1 mg/dL — ABNORMAL HIGH (ref <1.0)

## 2023-10-18 LAB — MAGNESIUM: Magnesium: 1.6 mg/dL — ABNORMAL LOW (ref 1.7–2.4)

## 2023-10-18 LAB — I-STAT CG4 LACTIC ACID, ED: Lactic Acid, Venous: 1.1 mmol/L (ref 0.5–1.9)

## 2023-10-18 MED ORDER — NALOXONE HCL 0.4 MG/ML IJ SOLN: 0.4000 mg | INTRAMUSCULAR | Status: DC | PRN

## 2023-10-18 MED ORDER — POLYETHYLENE GLYCOL 3350 17 G PO PACK: 17.0000 g | PACK | Freq: Two times a day (BID) | ORAL | Status: DC | PRN

## 2023-10-18 MED ORDER — BACLOFEN 10 MG PO TABS
20.0000 mg | ORAL_TABLET | Freq: Three times a day (TID) | ORAL | Status: DC
  Administered 2023-10-18 – 2023-10-26 (×34): 20 mg via ORAL
  Filled 2023-10-18 (×27): qty 2

## 2023-10-18 MED ORDER — ONDANSETRON HCL 4 MG/2ML IJ SOLN
4.0000 mg | Freq: Four times a day (QID) | INTRAMUSCULAR | Status: DC | PRN
  Administered 2023-10-22 (×2): 4 mg via INTRAVENOUS
  Filled 2023-10-18: qty 2

## 2023-10-18 MED ORDER — FENTANYL CITRATE PF 50 MCG/ML IJ SOSY
12.5000 ug | PREFILLED_SYRINGE | INTRAMUSCULAR | Status: DC | PRN
  Administered 2023-10-18: 12.5 ug via INTRAVENOUS
  Filled 2023-10-18: qty 1

## 2023-10-18 MED ORDER — LACTATED RINGERS IV SOLN: INTRAVENOUS | Status: AC

## 2023-10-18 MED ORDER — SODIUM CHLORIDE 0.9 % IV SOLN
2.0000 g | INTRAVENOUS | Status: DC
  Administered 2023-10-18 – 2023-10-19 (×2): 2 g via INTRAVENOUS
  Filled 2023-10-18 (×2): qty 20

## 2023-10-18 MED ORDER — DOCUSATE SODIUM 100 MG PO CAPS: 100.0000 mg | ORAL_CAPSULE | Freq: Two times a day (BID) | ORAL | Status: DC

## 2023-10-18 MED ORDER — GABAPENTIN 300 MG PO CAPS
600.0000 mg | ORAL_CAPSULE | Freq: Three times a day (TID) | ORAL | Status: DC
  Administered 2023-10-18 – 2023-10-27 (×38): 600 mg via ORAL
  Filled 2023-10-18 (×30): qty 2

## 2023-10-18 MED ORDER — BACLOFEN 10 MG PO TABS: 20.0000 mg | ORAL_TABLET | Freq: Four times a day (QID) | ORAL | Status: DC

## 2023-10-18 MED ORDER — ACETAMINOPHEN 325 MG PO TABS
650.0000 mg | ORAL_TABLET | Freq: Four times a day (QID) | ORAL | Status: DC | PRN
  Filled 2023-10-18: qty 2

## 2023-10-18 MED ORDER — PANTOPRAZOLE SODIUM 40 MG PO TBEC
40.0000 mg | DELAYED_RELEASE_TABLET | Freq: Every day | ORAL | Status: DC
  Administered 2023-10-19 – 2023-10-23 (×8): 40 mg via ORAL
  Filled 2023-10-18 (×6): qty 1

## 2023-10-18 MED ORDER — METRONIDAZOLE 500 MG/100ML IV SOLN
500.0000 mg | Freq: Two times a day (BID) | INTRAVENOUS | Status: DC
  Administered 2023-10-18 – 2023-10-21 (×13): 500 mg via INTRAVENOUS
  Filled 2023-10-18 (×9): qty 100

## 2023-10-18 MED ORDER — SENNOSIDES-DOCUSATE SODIUM 8.6-50 MG PO TABS
2.0000 | ORAL_TABLET | Freq: Two times a day (BID) | ORAL | Status: DC
  Administered 2023-10-20 – 2023-10-27 (×19): 2 via ORAL
  Filled 2023-10-18 (×20): qty 2

## 2023-10-18 MED ORDER — ACETAMINOPHEN 650 MG RE SUPP: 650.0000 mg | Freq: Four times a day (QID) | RECTAL | Status: DC | PRN

## 2023-10-18 MED ORDER — FLEET ENEMA RE ENEM
1.0000 | ENEMA | Freq: Once | RECTAL | Status: AC
  Administered 2023-10-18: 1 via RECTAL
  Filled 2023-10-18: qty 1

## 2023-10-18 MED ORDER — TRAMADOL HCL 50 MG PO TABS
50.0000 mg | ORAL_TABLET | Freq: Four times a day (QID) | ORAL | Status: DC | PRN
  Administered 2023-10-19 – 2023-10-27 (×26): 50 mg via ORAL
  Filled 2023-10-18 (×20): qty 1

## 2023-10-18 MED ORDER — ACETAMINOPHEN 500 MG PO TABS
1000.0000 mg | ORAL_TABLET | Freq: Three times a day (TID) | ORAL | Status: DC
  Administered 2023-10-18 – 2023-10-20 (×7): 1000 mg via ORAL
  Filled 2023-10-18 (×7): qty 2

## 2023-10-18 MED ORDER — MELATONIN 3 MG PO TABS
3.0000 mg | ORAL_TABLET | Freq: Every evening | ORAL | Status: DC | PRN
  Administered 2023-10-20 – 2023-10-26 (×12): 3 mg via ORAL
  Filled 2023-10-18 (×8): qty 1

## 2023-10-18 NOTE — ED Notes (Signed)
 Per MD, do not hang abx until blood cultures are obtained. Currently LP in process. Will attempt blood cultures when LP is complete.

## 2023-10-18 NOTE — ED Notes (Signed)
 Pt's brief was changed and wound care performed on sacral wound after pt voided.

## 2023-10-18 NOTE — Progress Notes (Signed)
  Carryover admission to the Day Admitter.  I discussed this case with the EDP, Dr. Theadore.  Per these discussions:   This is a 53 year old female with history of multiple sclerosis, bedbound, chronic sacral decubitus ulcer, who is being admitted with stercoral colitis after presenting with subacute fever, abdominal discomfort as well as some dysuria.  In the ED, noted to be afebrile, with temperature max 99.3  Urinalysis was ordered, with result currently pending.  Lactic acid 1.9.  Presenting CMP is also notable for acute prerenal azotemia.   CT abdomen/pelvis shows evidence of stercoral colitis without evidence of bowel obstruction or perforation.  EDP has placed order for Fleet enema x 1 and is preparing  to attempt a manual disimpaction.  She has also received a dose of Rocephin  in the ED.  I have placed an order for inpatient admission to med/tele for further evaluation and management of the above.  I have placed some additional preliminary admit orders via the adult multi-morbid admission order set. I have also ordered scheduled Colace as well as MiraLAX .  Prn IV Zofran .  Lactated Ringer 's at 100 cc/h x 10 hours.  Given her report of subjective fever and increased risk for microperforation in the setting of stercoral colitis, I have continued the Rocephin  that was started in the ED and have added Flagyl .  While attempting to not further exacerbate her underlying constipation, I have ordered low-dose fentanyl  12.5 mg IV every 2 hours prn to further address her abdominal discomfort.  I also ordered incentive spirometry as well as morning labs to include CMP, CBC, Mungal.    Eva Pore, DO Hospitalist

## 2023-10-18 NOTE — ED Notes (Signed)
 Patient gave verbal consent to EDP Theadore Ozell HERO, MD for fecal decompaction. This nurse witnessed and verified consent.

## 2023-10-18 NOTE — ED Notes (Signed)
 Pt cleaned up of bowel and bladder incontinence. Pt able to roll to help with linen change. Dressing saturated with stool, new mepilex dressing placed over wound to R buttocks. Tolerated well.

## 2023-10-18 NOTE — ED Notes (Signed)
 This nurse was performing peri care for patient when this nurse noticed patient's lower right abdomen to be distended. This nurse also noticed that there was no urine output in patient's brief. This nurse bladder scanned patient and was noted. Attending Howerter, Eva NOVAK, DO notified and will forward message to daytime attending.

## 2023-10-18 NOTE — ED Notes (Signed)
 In and out attempted x2 unsuccessful w/ Kaitlynn C, RN.

## 2023-10-18 NOTE — ED Provider Notes (Signed)
  Provider Note MRN:  [id number]  Arrival date & time: 10/18/23    ED Course and Medical Decision Making  Assumed care of patient at sign-out or upon transfer.  History of MS presenting with fever, abdominal discomfort, dysuria.  Workup revealing stercoral colitis with large amount of retained stool, likely impacted.  Complicated by large decubitus ulcer.  Accepted for admission by hospitalist service.  Enema and disimpaction performed here in the emergency department, see procedural details below.  .Fecal disimpaction  Date/Time: 10/18/2023 3:59 AM  Performed by: Theadore Ozell HERO, MD Authorized by: Theadore Ozell HERO, MD  Consent: Verbal consent obtained Risks and benefits: risks, benefits and alternatives were discussed Consent given by: patient Patient understanding: patient states understanding of the procedure being performed Imaging studies: imaging studies available Patient identity confirmed: verbally with patient Time out: Immediately prior to procedure a time out was called to verify the correct patient, procedure, equipment, support staff and site/side marked as required. Local anesthesia used: no  Anesthesia: Local anesthesia used: no  Sedation: Patient sedated: no  Patient tolerance: patient tolerated the procedure well with no immediate complications Comments: 49 French coud Foley catheter placed within the anus.  10 cc balloon filled with saline.  Fleets enema provided through the catheter.  Successful removal of large amount of stool.  Balloon deflated and catheter removed by nursing.     Final Clinical Impressions(s) / ED Diagnoses     ICD-10-CM   1. Stercoral colitis  K52.89       ED Discharge Orders     None       Discharge Instructions   None     Ozell HERO. Theadore, MD [facility] [email or web address]    Theadore Ozell HERO, MD 10/18/23 [phone]

## 2023-10-18 NOTE — H&P (Signed)
 History and Physical    Patient: [name] FMW:[id number] DOB: July 03, 1970 DOA: 10/17/2023 DOS: the patient was seen and examined on 10/18/2023 PCP: [facility]  Patient coming from: Home  Chief Complaint:  Chief Complaint  Patient presents with   Fever   HPI: [name] is a 53 y.o. female with medical history significant of MS, and cirrhosis on admission imaging p/w stercoral colitis c/b R hydroureteronephrosis and acute urinary retention.  Pt is a poor historian and has some c/f mild cognitive decline. From what I can gather per Epic review and discussion with EDP (family unavailable by phone), pt presented to ED with fever, as well as abdominal discomfort and dysuria.  In the ED, pt tachycardic and tachypneic on RA. Labs notable for Mg 1.6, lactate 1.9-->1.1, WBC 7.4. UA showed LE and few bacteria. Urine culture pending. CT chest/abd/pelvis showed markedly dilated rectum filled with stool with diffuse rectal wall thickening and surrounding inflammation...compatible with stercoral colitis, and mild right-sided hydroureteronephrosis to the level of the distal ureter, likely secondary to compression from the rectum. EDP requested medicine admission.  Review of Systems: As mentioned in the history of present illness. All other systems reviewed and are negative. Past Medical History:  Diagnosis Date   Arthritis    MS (multiple sclerosis) [facility])    Past Surgical History:  Procedure Laterality Date   HARDWARE REMOVAL Left 06/17/2023   Procedure: REMOVAL, HARDWARE LEFT ANKLE;  Surgeon: Burnetta Aures, MD;  Location: [facility] OR;  Service: Orthopedics;  Laterality: Left;   INCISION AND DRAINAGE OF WOUND Left 06/17/2023   Procedure: IRRIGATION AND DEBRIDEMENT WOUND;  Surgeon: Lowery Estefana RAMAN, DO;  Location: [facility] OR;  Service: Plastics;  Laterality: Left;  irrigation and debridement of left lower extremity wound with placement of myriad   ORIF ANKLE FRACTURE Left 03/30/2013    Procedure: OPEN REDUCTION INTERNAL FIXATION (ORIF) LEFT ANKLE FRACTURE;  Surgeon: Norleen Armor, MD;  Location: [facility] OR;  Service: Orthopedics;  Laterality: Left;   Social History:  reports that she quit smoking about 15 years ago. Her smoking use included cigarettes. She started smoking about 35 years ago. She has a 40 pack-year smoking history. She quit smokeless tobacco use about 4 years ago. She reports that she does not drink alcohol and does not use drugs.  Allergies  Allergen Reactions   Tomato Rash and Other (See Comments)    Tongue turns white and cannot taste for at least 2 days.    Tizanidine Diarrhea    History reviewed. No pertinent family history.  Prior to Admission medications   Medication Sig Start Date End Date Taking? Authorizing Provider  acetaminophen  (TYLENOL ) 500 MG tablet Take 1,000 mg by mouth in the morning, at noon, and at bedtime.   Yes [provider]  ASHWAGANDHA GUMMIES PO Take 1 each by mouth at bedtime.   Yes [provider]  baclofen  (LIORESAL ) 20 MG tablet Take 20 mg by mouth 4 (four) times daily. 11/05/18  Yes [provider]  Bioflavonoid Products (SUPER-C 1000 PO) Take 3 each by mouth every morning. VITAFUSION SUPER C Gummies   Yes [provider]  gabapentin  (NEURONTIN ) 400 MG capsule Take 400 mg by mouth 4 (four) times daily. Take one capsule (400mg ) by mouth four times each day and one additional capsule (400mg ) as needed. 08/21/23  Yes [provider]  liver oil-zinc  oxide (DESITIN) 40 % ointment Apply 1 Application topically as needed for irritation.   Yes [provider]  meloxicam  (MOBIC ) 15 MG tablet Take 15 mg by mouth daily. 09/19/23  Yes [provider]  mineral oil-hydrophilic petrolatum (AQUAPHOR) ointment Apply 1 Application topically as needed for irritation or dry skin.   Yes [provider]  Multiple Vitamins-Minerals (VITAFUSION MULTI WOMENS) CHEW Chew 2 each by mouth in the  morning. VITAFUSION WOMEN'S MULTIVITAMIN GUMMIES   Yes [provider]  nitrofurantoin (MACRODANTIN) 50 MG capsule Take 50 mg by mouth daily. 09/03/23  Yes [provider]  omeprazole (PRILOSEC) 40 MG capsule Take 40 mg by mouth daily as needed (acid refluc, indigestion).   Yes [provider]  senna-docusate (SENOKOT-S) 8.6-50 MG tablet Take 2 tablets by mouth at bedtime.   Yes [provider]  traMADol  (ULTRAM ) 50 MG tablet Take 1 tablet (50 mg total) by mouth every 12 (twelve) hours as needed for moderate pain. 02/22/21  Yes Ghimire, Donalda HERO, MD  Wound Dressings (TRIAD HYDROPHILIC WOUND DRESSI) PSTE Apply 1 Application topically as needed (Raw, burning, irritated skin).   Yes [provider]    Physical Exam: Vitals:   10/18/23 0330 10/18/23 0400 10/18/23 0415 10/18/23 0600  BP: (!) 140/80 (!) 143/80 (!) 132/95 (!) 164/87  Pulse: (!) 115 (!) 110 (!) 111 (!) 130  Resp: 15 (!) 26 20 (!) 23  Temp:    98.9 F (37.2 C)  TempSrc:      SpO2: 100% 99% 98% 100%   General: Alert, oriented x3, resting comfortably in no acute distress Respiratory: Lungs clear to auscultation bilaterally with normal respiratory effort; no w/r/r Cardiovascular: Regular rate and rhythm w/o m/r/g Abdomen: Soft, nontender, nondistended. Positive bowel sounds   Data Reviewed:  Lab Results  Component Value Date   WBC 6.5 10/18/2023   HGB 8.5 (L) 10/18/2023   HCT 30.0 (L) 10/18/2023   MCV 79.6 (L) 10/18/2023   PLT 224 10/18/2023   Lab Results  Component Value Date   GLUCOSE 98 10/18/2023   CALCIUM  8.9 10/18/2023   NA 140 10/18/2023   K 3.6 10/18/2023   CO2 23 10/18/2023   CL 107 10/18/2023   BUN 21 (H) 10/18/2023   CREATININE 0.53 10/18/2023   Lab Results  Component Value Date   ALT 9 10/18/2023   AST 27 10/18/2023   ALKPHOS 80 10/18/2023   BILITOT 0.3 10/18/2023   Lab Results  Component Value Date   INR 1.0 10/17/2023   INR 1.1 06/13/2023   INR  1.1 02/18/2021    Radiology: CT CHEST ABDOMEN PELVIS W CONTRAST Result Date: 10/18/2023 CLINICAL DATA:  Sepsis and abdominal pain.  Sacral wound EXAM: CT CHEST, ABDOMEN, AND PELVIS WITH CONTRAST TECHNIQUE: Multidetector CT imaging of the chest, abdomen and pelvis was performed following the standard protocol during bolus administration of intravenous contrast. RADIATION DOSE REDUCTION: This exam was performed according to the departmental dose-optimization program which includes automated exposure control, adjustment of the mA and/or kV according to patient size and/or use of iterative reconstruction technique. CONTRAST:  75mL OMNIPAQUE  IOHEXOL  350 MG/ML SOLN COMPARISON:  CT abdomen and pelvis 02/19/2021. FINDINGS: CT CHEST FINDINGS Cardiovascular: No significant vascular findings. Normal heart size. No pericardial effusion. Mediastinum/Nodes: No enlarged mediastinal, hilar, or axillary lymph nodes. There is a 1 cm hypodense left thyroid  nodule. There are no enlarged lymph nodes. Esophagus is within normal limits. Lungs/Pleura: Lungs are clear. No pleural effusion or pneumothorax. Musculoskeletal: No chest wall mass or suspicious bone lesions identified. There is mild elevation of the left hemidiaphragm, unchanged. CT ABDOMEN PELVIS  FINDINGS Hepatobiliary: There is nodular liver contour. No focal liver lesion identified. Gallbladder and bile ducts are within normal limits. Pancreas: Unremarkable. No pancreatic ductal dilatation or surrounding inflammatory changes. Spleen: Spleen is mild-to-moderately enlarged, unchanged. Adrenals/Urinary Tract: Bilateral renal calculi are present, left greater than right, measuring up to 4 mm. There is mild right-sided hydroureteronephrosis to the level of the distal ureter, likely secondary to compression from the rectum. No obstructing calculus identified. There is no left-sided hydronephrosis. There is no perinephric fluid or stranding. There is a 2.8 cm right renal cyst. The  adrenal glands are within normal limits. The bladder is distended, but otherwise within normal limits. Stomach/Bowel: The rectum is markedly dilated and filled with stool measuring up to 14 cm in diameter. There is diffuse rectal wall thickening with mild surrounding inflammation. No other dilated bowel loops are identified. There is no bowel obstruction. Appendix appears within normal limits. Overall stool burden is large. Vascular/Lymphatic: Aorta and IVC are normal in size. There are atherosclerotic calcifications throughout the aorta. [place] off a GIA varices are present. No enlarged lymph nodes are seen. Reproductive: Status post hysterectomy. No adnexal masses. Other: There is no ascites or focal abdominal wall hernia. Musculoskeletal: Bilateral ischial decubitus ulcerations are present to the level of the bone. This is not significantly changed. No acute fracture identified. Degenerative changes affect the spine. No focal abscess identified. No soft tissue gas. IMPRESSION: 1. Markedly dilated rectum filled with stool with diffuse rectal wall thickening and surrounding inflammation. Findings are compatible with stercoral colitis. 2. Mild right-sided hydroureteronephrosis to the level of the distal ureter, likely secondary to compression from the rectum. 3. Bilateral nonobstructing renal calculi. 4. Findings compatible with cirrhosis and portal hypertension. 5. Stable bilateral ischial decubitus ulcerations extending to the level of the ischia. 6. 1 cm incidental left thyroid  nodule. Not clinically significant; no follow-up imaging recommended (ref: J Am Coll Radiol. 2015 Feb;12(2): 143-50). 7. No acute cardiopulmonary process. Aortic Atherosclerosis (ICD10-I70.0). Electronically Signed   By: Greig Pique M.D.   On: 10/18/2023 00:02   DG Chest Port 1 View Result Date: 10/17/2023 CLINICAL DATA:  Questionable sepsis EXAM: PORTABLE CHEST 1 VIEW COMPARISON:  Chest x-ray 06/13/2023 FINDINGS: There are 2 small  adjacent nodular densities in the left perihilar region measuring up to 4 mm, indeterminate. The lungs are otherwise clear. There is no pleural effusion or pneumothorax. The cardiomediastinal silhouette is within normal limits. No acute fractures are seen. IMPRESSION: Two small adjacent nodular densities in the left perihilar region measuring up to 4 mm, indeterminate. Recommend further evaluation with chest CT. Electronically Signed   By: Greig Pique M.D.   On: 10/17/2023 22:22    Assessment and Plan: 43F h/o MS, and cirrhosis on admission imaging p/w stercoral colitis c/b R hydroureteronephrosis and acute urinary retention.  Stercoral colitis -IV CTX and IV metronidazole  for now -PO senna/docusate 2 tabs BID and miralax  BID -Multimodal pain control: PTA tylenol  1g TID, baclofen  TID, gabapentin  TID, and tramadol  prn -Avoid narcotics given above  R hydronephrosis Acute urinary retention -Urology consulted; recs: agree with foley placement; no need for R ureteral stent given mild hydronephrosis; repeat renal US  in 48h to eval for resolution of R hydronephrosis  Cirrhosis -OP GI eval at d/c -Avoid diuretics for now given above presentation with stercoral colitis   Advance Care Planning:   Code Status: Full Code   Consults: N/A  Family Communication: Mother  Severity of Illness: The appropriate patient status for this patient is INPATIENT.  Inpatient status is judged to be reasonable and necessary in order to provide the required intensity of service to ensure the patient's safety. The patient's presenting symptoms, physical exam findings, and initial radiographic and laboratory data in the context of their chronic comorbidities is felt to place them at high risk for further clinical deterioration. Furthermore, it is not anticipated that the patient will be medically stable for discharge from the hospital within 2 midnights of admission.   * I certify that at the point of admission it is  my clinical judgment that the patient will require inpatient hospital care spanning beyond 2 midnights from the point of admission due to high intensity of service, high risk for further deterioration and high frequency of surveillance required.*   ------- I spent 55 minutes reviewing previous notes, at the bedside counseling/discussing the treatment plan, and performing clinical documentation.  Author: Marsha Ada, MD 10/18/2023 8:21 AM  For on call review [email or web address].

## 2023-10-18 NOTE — ED Notes (Signed)
 Patient was cleansed of bowel and urine and foam dressings placed on pressure injuries x2. The patient is now moving to the floor via transport.

## 2023-10-18 NOTE — ED Notes (Addendum)
 CCMD notified of movement to 38.

## 2023-10-18 NOTE — Progress Notes (Signed)
 [facility] Liaison Note  This is a current Home-Based Primary Care patient with [facility]. We will follow for discharge disposition. Please call with any questions or concerns.  Nat Babe, BSN, RN [employer] [phone]

## 2023-10-19 DIAGNOSIS — N390 Urinary tract infection, site not specified: Secondary | ICD-10-CM | POA: Diagnosis not present

## 2023-10-19 DIAGNOSIS — N1330 Unspecified hydronephrosis: Secondary | ICD-10-CM

## 2023-10-19 DIAGNOSIS — K5289 Other specified noninfective gastroenteritis and colitis: Secondary | ICD-10-CM | POA: Diagnosis not present

## 2023-10-19 DIAGNOSIS — R338 Other retention of urine: Secondary | ICD-10-CM | POA: Diagnosis not present

## 2023-10-19 MED ORDER — MAGNESIUM SULFATE 2 GM/50ML IV SOLN
2.0000 g | Freq: Once | INTRAVENOUS | Status: AC
  Administered 2023-10-19: 2 g via INTRAVENOUS
  Filled 2023-10-19: qty 50

## 2023-10-19 MED ORDER — SODIUM CHLORIDE 0.9 % IV SOLN
1.5000 g | Freq: Four times a day (QID) | INTRAVENOUS | Status: DC
  Administered 2023-10-19 – 2023-10-22 (×20): 1.5 g via INTRAVENOUS
  Filled 2023-10-19 (×13): qty 4

## 2023-10-19 MED ORDER — ZINC OXIDE 40 % EX OINT
TOPICAL_OINTMENT | Freq: Two times a day (BID) | CUTANEOUS | Status: DC
  Filled 2023-10-19: qty 57

## 2023-10-19 NOTE — Progress Notes (Signed)
 [facility]                                                                               [name], is a 53 y.o. female, DOB - 05/28/1970, FMW:[id number] Admit date - 10/17/2023    Outpatient Primary MD for the patient is [facility]  LOS - 1  days    Brief summary    [name] is a 53 y.o. female with medical history significant of MS, and cirrhosis on admission imaging p/w stercoral colitis c/b R hydroureteronephrosis and acute urinary retention.   CT chest/abd/pelvis showed markedly dilated rectum filled with stool with diffuse rectal wall thickening and surrounding inflammation...compatible with stercoral colitis, and mild right-sided hydroureteronephrosis to the level of the distal ureter, likely secondary to compression from the rectum. She was admitted for evaluation of stercoral colitis.     Assessment & Plan    Assessment and Plan:  Stercoral colitis - CONTINUE with IV antibiotics.  - had 3 bowel movements today.  - advancing diet as tolerated.  - pain control.     Right hydronephrosis Urology consulted; recs: agree with foley placement; no need for R ureteral stent given mild hydronephrosis; repeat renal US  in 48h to eval for resolution of R hydronephrosis      Cirrhosis Recommend outpatient follow up with GI  UTI Await urine culture report.    Estimated body mass index is 23.91 kg/m as calculated from the following:   Height as of this encounter: 5' 2 (1.575 m).   Weight as of this encounter: 59.3 kg.  Code Status: full code.  DVT Prophylaxis:  SCDs Start: 10/18/23 0132   Level of Care: Level of care: Progressive Family Communication: none at bedside.   Disposition Plan:     Remains inpatient appropriate:  pending clinical improvement.  Procedures:  None.   Consultants:   Wound care.   Antimicrobials:   Anti-infectives (From admission, onward)    Start     Dose/Rate Route Frequency Ordered Stop    10/18/23 1000  cefTRIAXone  (ROCEPHIN ) 2 g in sodium chloride  0.9 % 100 mL IVPB        2 g 200 mL/hr over 30 Minutes Intravenous Every 24 hours 10/18/23 0139     10/18/23 0145  metroNIDAZOLE  (FLAGYL ) IVPB 500 mg        500 mg 100 mL/hr over 60 Minutes Intravenous 2 times daily 10/18/23 0139     10/17/23 2345  cefTRIAXone  (ROCEPHIN ) 1 g in sodium chloride  0.9 % 100 mL IVPB        1 g 200 mL/hr over 30 Minutes Intravenous  Once 10/17/23 2343 10/18/23 0045        Medications  Scheduled Meds:  acetaminophen   1,000 mg Oral TID   baclofen   20 mg Oral TID   gabapentin   600 mg Oral TID   liver oil-zinc  oxide   Topical BID   pantoprazole   40 mg Oral Daily   senna-docusate  2 tablet Oral BID   Continuous Infusions:  cefTRIAXone  (ROCEPHIN )  IV 2 g (10/19/23 1228)   metronidazole  500 mg (10/19/23 1054)   PRN Meds:.melatonin, naLOXone  (NARCAN )  injection, ondansetron  (ZOFRAN )  IV, polyethylene glycol, traMADol     Subjective:   [name] was seen and examined today.  Bedbound, she is requesting to advance diet.   Objective:   Vitals:   10/19/23 0428 10/19/23 0635 10/19/23 0713 10/19/23 1224  BP: 109/61 109/61 93/64 100/65  Pulse: 78 78 (!) 116 96  Resp: 18 18 20 19   Temp: 98.8 F (37.1 C) 98.8 F (37.1 C) 98.5 F (36.9 C) 98.6 F (37 C)  TempSrc: Oral Oral Oral Oral  SpO2: 97%  98% 99%  Weight: 59.3 kg 59.3 kg    Height:  5' 2 (1.575 m)      Intake/Output Summary (Last 24 hours) at 10/19/2023 1453 Last data filed at 10/19/2023 1100 Gross per 24 hour  Intake 516.3 ml  Output 450 ml  Net 66.3 ml   Filed Weights   10/19/23 0428 10/19/23 0635  Weight: 59.3 kg 59.3 kg     Exam General exam: Appears calm and comfortable  Respiratory system: Clear to auscultation. Respiratory effort normal. Cardiovascular system: S1 & S2 heard, RRR. Gastrointestinal system: Abdomen is nondistended, soft and nontender. Central nervous system: Alert and oriented.  Extremities:  contracted lower extremities.  Skin: Stage 4 Pressure Injury R glute and L ischium, R glute appears largely clean  Psychiatry: Mood & affect appropriate.     Data Reviewed:  I have personally reviewed following labs and imaging studies   CBC Lab Results  Component Value Date   WBC 6.5 10/18/2023   RBC 3.77 (L) 10/18/2023   HGB 8.5 (L) 10/18/2023   HCT 30.0 (L) 10/18/2023   MCV 79.6 (L) 10/18/2023   MCH 22.5 (L) 10/18/2023   PLT 224 10/18/2023   MCHC 28.3 (L) 10/18/2023   RDW 17.6 (H) 10/18/2023   LYMPHSABS 2.0 10/18/2023   MONOABS 0.4 10/18/2023   EOSABS 0.1 10/18/2023   BASOSABS 0.0 10/18/2023     Last metabolic panel Lab Results  Component Value Date   NA 140 10/18/2023   K 3.6 10/18/2023   CL 107 10/18/2023   CO2 23 10/18/2023   BUN 21 (H) 10/18/2023   CREATININE 0.53 10/18/2023   GLUCOSE 98 10/18/2023   GFRNONAA >60 10/18/2023   GFRAA >60 02/02/2019   CALCIUM  8.9 10/18/2023   PHOS 2.6 06/14/2023   PROT 6.7 10/18/2023   ALBUMIN 2.6 (L) 10/18/2023   BILITOT 0.3 10/18/2023   ALKPHOS 80 10/18/2023   AST 27 10/18/2023   ALT 9 10/18/2023   ANIONGAP 10 10/18/2023    CBG (last 3)  No results for input(s): GLUCAP in the last 72 hours.    Coagulation Profile: Recent Labs  Lab 10/17/23 2210  INR 1.0     Radiology Studies: CT CHEST ABDOMEN PELVIS W CONTRAST Result Date: 10/18/2023 CLINICAL DATA:  Sepsis and abdominal pain.  Sacral wound EXAM: CT CHEST, ABDOMEN, AND PELVIS WITH CONTRAST TECHNIQUE: Multidetector CT imaging of the chest, abdomen and pelvis was performed following the standard protocol during bolus administration of intravenous contrast. RADIATION DOSE REDUCTION: This exam was performed according to the departmental dose-optimization program which includes automated exposure control, adjustment of the mA and/or kV according to patient size and/or use of iterative reconstruction technique. CONTRAST:  75mL OMNIPAQUE  IOHEXOL  350 MG/ML SOLN  COMPARISON:  CT abdomen and pelvis 02/19/2021. FINDINGS: CT CHEST FINDINGS Cardiovascular: No significant vascular findings. Normal heart size. No pericardial effusion. Mediastinum/Nodes: No enlarged mediastinal, hilar, or axillary lymph nodes. There is a 1 cm hypodense left thyroid  nodule. There are no enlarged  lymph nodes. Esophagus is within normal limits. Lungs/Pleura: Lungs are clear. No pleural effusion or pneumothorax. Musculoskeletal: No chest wall mass or suspicious bone lesions identified. There is mild elevation of the left hemidiaphragm, unchanged. CT ABDOMEN PELVIS FINDINGS Hepatobiliary: There is nodular liver contour. No focal liver lesion identified. Gallbladder and bile ducts are within normal limits. Pancreas: Unremarkable. No pancreatic ductal dilatation or surrounding inflammatory changes. Spleen: Spleen is mild-to-moderately enlarged, unchanged. Adrenals/Urinary Tract: Bilateral renal calculi are present, left greater than right, measuring up to 4 mm. There is mild right-sided hydroureteronephrosis to the level of the distal ureter, likely secondary to compression from the rectum. No obstructing calculus identified. There is no left-sided hydronephrosis. There is no perinephric fluid or stranding. There is a 2.8 cm right renal cyst. The adrenal glands are within normal limits. The bladder is distended, but otherwise within normal limits. Stomach/Bowel: The rectum is markedly dilated and filled with stool measuring up to 14 cm in diameter. There is diffuse rectal wall thickening with mild surrounding inflammation. No other dilated bowel loops are identified. There is no bowel obstruction. Appendix appears within normal limits. Overall stool burden is large. Vascular/Lymphatic: Aorta and IVC are normal in size. There are atherosclerotic calcifications throughout the aorta. [place] off a GIA varices are present. No enlarged lymph nodes are seen. Reproductive: Status post hysterectomy. No adnexal  masses. Other: There is no ascites or focal abdominal wall hernia. Musculoskeletal: Bilateral ischial decubitus ulcerations are present to the level of the bone. This is not significantly changed. No acute fracture identified. Degenerative changes affect the spine. No focal abscess identified. No soft tissue gas. IMPRESSION: 1. Markedly dilated rectum filled with stool with diffuse rectal wall thickening and surrounding inflammation. Findings are compatible with stercoral colitis. 2. Mild right-sided hydroureteronephrosis to the level of the distal ureter, likely secondary to compression from the rectum. 3. Bilateral nonobstructing renal calculi. 4. Findings compatible with cirrhosis and portal hypertension. 5. Stable bilateral ischial decubitus ulcerations extending to the level of the ischia. 6. 1 cm incidental left thyroid  nodule. Not clinically significant; no follow-up imaging recommended (ref: J Am Coll Radiol. 2015 Feb;12(2): 143-50). 7. No acute cardiopulmonary process. Aortic Atherosclerosis (ICD10-I70.0). Electronically Signed   By: Greig Pique M.D.   On: 10/18/2023 00:02   DG Chest Port 1 View Result Date: 10/17/2023 CLINICAL DATA:  Questionable sepsis EXAM: PORTABLE CHEST 1 VIEW COMPARISON:  Chest x-ray 06/13/2023 FINDINGS: There are 2 small adjacent nodular densities in the left perihilar region measuring up to 4 mm, indeterminate. The lungs are otherwise clear. There is no pleural effusion or pneumothorax. The cardiomediastinal silhouette is within normal limits. No acute fractures are seen. IMPRESSION: Two small adjacent nodular densities in the left perihilar region measuring up to 4 mm, indeterminate. Recommend further evaluation with chest CT. Electronically Signed   By: Greig Pique M.D.   On: 10/17/2023 22:22       Elgie Butter M.D. [facility] 10/19/2023, 2:53 PM  Available via Epic secure chat 7am-7pm After 7 pm, please refer to night coverage provider listed on  amion.

## 2023-10-19 NOTE — Plan of Care (Signed)

## 2023-10-19 NOTE — Plan of Care (Signed)

## 2023-10-19 NOTE — Consult Note (Addendum)
 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 Lowery 06/17/2023  Reason for Consult: sacral and ankle wound  Wound type: 1.  Stage 4 Pressure Injury R glute and L ischium, R glute appears largely clean, L ischium appears healed  2.  L ankle pink and moist Pressure Injury POA:  yes  Measurement: per nursing flowsheet  Wound bed: as above   Drainage (amount, consistency, odor) see nursing flowsheet  Periwound: erythema and moisture associated skin damage surrounding glute and ischium  Dressing procedure/placement/frequency:  Cleanse R glute and any open wounds to L ischium with Vashe wound cleanser Soila [phone]) do not rinse and allow to air dry  Using a Q tip applicator insert silver hydrofiber Aquacel [employer] ([place] [id number]) into wound beds daily, cover with dry gauze and silicone foam or ABD pad whichever is preferred.  Cleanse L ankle wound with Vashe, allow to air dry and apply a small piece of silver hydrofiber Soila [phone]) cut to fit wound bed daily, secure with silicone foam.   Patient should be placed on a low air loss mattress for pressure redistribution and moisture management.  POC discussed with bedside nurse.  WOC team will not follow. Patient should continue to follow as outpatient with wound care center and plastic surgeon for chronic wounds.  It appears patient also has home health providing dressing changes at home.   Please reconsult as needed.    Thank you,    Powell Bar MSN, RN-BC, [employer]

## 2023-10-20 ENCOUNTER — Inpatient Hospital Stay (HOSPITAL_COMMUNITY)

## 2023-10-20 DIAGNOSIS — N390 Urinary tract infection, site not specified: Secondary | ICD-10-CM | POA: Diagnosis not present

## 2023-10-20 DIAGNOSIS — R338 Other retention of urine: Secondary | ICD-10-CM | POA: Diagnosis not present

## 2023-10-20 DIAGNOSIS — K5289 Other specified noninfective gastroenteritis and colitis: Secondary | ICD-10-CM | POA: Diagnosis not present

## 2023-10-20 DIAGNOSIS — N1330 Unspecified hydronephrosis: Secondary | ICD-10-CM | POA: Diagnosis not present

## 2023-10-20 LAB — IRON AND TIBC
Iron: 11 ug/dL — ABNORMAL LOW (ref 28–170)
Saturation Ratios: 3 % — ABNORMAL LOW (ref 10.4–31.8)
TIBC: 381 ug/dL (ref 250–450)
UIBC: 370 ug/dL

## 2023-10-20 LAB — MAGNESIUM: Magnesium: 1.8 mg/dL (ref 1.7–2.4)

## 2023-10-20 LAB — CBC WITH DIFFERENTIAL/PLATELET
Abs Immature Granulocytes: 0.01 K/uL (ref 0.00–0.07)
Basophils Absolute: 0 K/uL (ref 0.0–0.1)
Basophils Relative: 0 %
Eosinophils Absolute: 0.2 K/uL (ref 0.0–0.5)
Eosinophils Relative: 3 %
HCT: 28.5 % — ABNORMAL LOW (ref 36.0–46.0)
Hemoglobin: 8.2 g/dL — ABNORMAL LOW (ref 12.0–15.0)
Immature Granulocytes: 0 %
Lymphocytes Relative: 47 %
Lymphs Abs: 2.3 K/uL (ref 0.7–4.0)
MCH: 22.8 pg — ABNORMAL LOW (ref 26.0–34.0)
MCHC: 28.8 g/dL — ABNORMAL LOW (ref 30.0–36.0)
MCV: 79.2 fL — ABNORMAL LOW (ref 80.0–100.0)
Monocytes Absolute: 0.4 K/uL (ref 0.1–1.0)
Monocytes Relative: 9 %
Neutro Abs: 2 K/uL (ref 1.7–7.7)
Neutrophils Relative %: 41 %
Platelets: 183 K/uL (ref 150–400)
RBC: 3.6 MIL/uL — ABNORMAL LOW (ref 3.87–5.11)
RDW: 18 % — ABNORMAL HIGH (ref 11.5–15.5)
WBC: 4.9 K/uL (ref 4.0–10.5)
nRBC: 0 % (ref 0.0–0.2)

## 2023-10-20 LAB — BASIC METABOLIC PANEL WITH GFR
Anion gap: 8 (ref 5–15)
BUN: 13 mg/dL (ref 6–20)
CO2: 23 mmol/L (ref 22–32)
Calcium: 8.3 mg/dL — ABNORMAL LOW (ref 8.9–10.3)
Chloride: 110 mmol/L (ref 98–111)
Creatinine, Ser: 0.7 mg/dL (ref 0.44–1.00)
GFR, Estimated: >60 mL/min (ref >60)
Glucose, Bld: 100 mg/dL — ABNORMAL HIGH (ref 70–99)
Potassium: 3.3 mmol/L — ABNORMAL LOW (ref 3.5–5.1)
Sodium: 141 mmol/L (ref 135–145)

## 2023-10-20 LAB — URINE CULTURE: Culture: >=100000 — AB

## 2023-10-20 LAB — FOLATE: Folate: 21.7 ng/mL (ref >5.9)

## 2023-10-20 LAB — RETICULOCYTES
Immature Retic Fract: 21.5 % — ABNORMAL HIGH (ref 2.3–15.9)
RBC.: 3.55 MIL/uL — ABNORMAL LOW (ref 3.87–5.11)
Retic Count, Absolute: 61.1 K/uL (ref 19.0–186.0)
Retic Ct Pct: 1.7 % (ref 0.4–3.1)

## 2023-10-20 LAB — VITAMIN B12: Vitamin B-12: 620 pg/mL (ref 180–914)

## 2023-10-20 LAB — FERRITIN: Ferritin: 6 ng/mL — ABNORMAL LOW (ref 11–307)

## 2023-10-20 MED ORDER — ACETAMINOPHEN 325 MG PO TABS
650.0000 mg | ORAL_TABLET | Freq: Four times a day (QID) | ORAL | Status: DC | PRN
  Filled 2023-10-20: qty 2

## 2023-10-20 MED ORDER — CIPROFLOXACIN HCL 500 MG PO TABS
500.0000 mg | ORAL_TABLET | Freq: Two times a day (BID) | ORAL | Status: DC
  Administered 2023-10-20 – 2023-10-21 (×6): 500 mg via ORAL
  Filled 2023-10-20 (×4): qty 1

## 2023-10-20 NOTE — TOC CM/SW Note (Signed)
 Transition of Care [facility]) - Inpatient Brief Assessment   Patient Details  Name: [name] MRN: [id number] Date of Birth: April 28, 1970  Transition of Care [facility]) CM/SW Contact:    Lauraine FORBES Saa, LCSWA Phone Number: 10/20/2023, 10:23 AM   Clinical Narrative:  10:23 AM Per chart review, patient resides at home with parent(s). Patient has a PCP and insurance. Patient has SNF history with [facility] and [facility]. Patient is currently active with [facility]. Patieny has HH history with [facility],  and [facility]. Patient has DME (specialty mattress, manual wheelchair and wheelchair cushion) with [employer]. Patient's preferred pharmacy's are [facility] and [phone]. No TOC needs were identified at this time. TOC will continue to follow and be available to assist.  Transition of Care Asessment: Insurance and Status: Insurance coverage has been reviewed Patient has primary care physician: Yes Home environment has been reviewed: Private Residence Prior level of function:: N/A Prior/Current Home Services: Current home services (Active with [facility]; Has HH/DME history) Social Drivers of Health Review: SDOH reviewed no interventions necessary Readmission risk has been reviewed: Yes (Currently Green 9%) Transition of care needs: no transition of care needs at this time

## 2023-10-20 NOTE — Progress Notes (Signed)
 [name] [phone] [facility] Liaison Note:  This patient is currently enrolled in [facility] home [facility] primary care.  Please call for any questions or concerns. [facility] will follow until patient is discharged.   Thank you,  Amy Darien BSN, RN [facility] Liaison [phone]

## 2023-10-20 NOTE — Evaluation (Signed)
 Physical Therapy Evaluation and Discharge Patient Details Name: [name] MRN: [id number] DOB: 07-30-1970 Today's Date: 10/20/2023  History of Present Illness  53 yo female admitted 8/8 with images (+) stercoral colitis c/b R hydroureteronephrosis and acute urinary retention PMH: MS, cirrhosis, arthritis ORIF L ankle fixation.  Clinical Impression  Patient evaluated by Physical Therapy with no further acute PT needs identified. All education has been completed and the patient has no further questions. Has been bed-bound for 10 years per pt. Unable to use hoyer lift as her mother is too weak. Mother assists with pericare and hygiene as needed. She is able to roll in her hospital bed using rail with assist. She was able to demonstrate this level of ability today at a Min A level. Seems to be at baseline. Would consider discussing further care options as her mother ages. Pt may want to look into some level of skilled/assisted living. Discussed importance of mobilizing as tolerated and positioning to lower risks associated with immobility. See below for any follow-up Physical Therapy or equipment needs. PT is signing off. Thank you for this referral.         If plan is discharge home, recommend the following: Assist for transportation;Assistance with cooking/housework   Can travel by private vehicle        Equipment Recommendations None recommended by PT  Recommendations for Other Services       Functional Status Assessment Patient has not had a recent decline in their functional status     Precautions / Restrictions Precautions Precautions: Fall;Other (comment) (increase pressure wound risk) Recall of Precautions/Restrictions: Intact Restrictions Other Position/Activity Restrictions: Has not gotten out of bed in 10 years per pt report.      Mobility  Bed Mobility Overal bed mobility: Needs Assistance Bed Mobility: Rolling Rolling: Min assist         General bed mobility  comments: pt rolling R and L with decreased clearance of buttock from surface with rolling. Needs up to min assist to complete rolling transitions to each side. pt increased effort rolling toward the R side. pt reports this is baseline. pt tolerated full upright sitting in chair position. Able to grasp rails and pull into long sit position briefly but barely clearing her scapulae from bed.    Transfers                   General transfer comment: pt reports not being oOB in 10 years. Utilized bed to assess tolerance for full right posture    Ambulation/Gait                  Stairs            Wheelchair Mobility     Tilt Bed    Modified Rankin (Stroke Patients Only)       Balance                                             Pertinent Vitals/Pain Pain Assessment Pain Assessment: Faces Faces Pain Scale: Hurts a little bit Pain Location: PROM of joints  in LE, Lt more than Rt. Pain Descriptors / Indicators: Sore Pain Intervention(s): Monitored during session, Repositioned    Home Living Family/patient expects to be discharged to:: Private residence Living Arrangements: Parent (mom/ dad) Available Help at Discharge: Family;Available 24 hours/day Type of Home: House Home Access:  Ramped entrance       Home Layout: One level Home Equipment: Hospital bed;Grab bars - toilet (hoyer lift, half rails, air mattress) Additional Comments: reports mother takes care of her, has aide thaey paid to come 1x week self pay. mother 71 dad 24 yo unable to do the manual lift    Prior Function Prior Level of Function : Needs assist             Mobility Comments: does not get oob at all since 2015 ADLs Comments: bath UB bed level and she completes. pt able to roll over with UB only     Extremity/Trunk Assessment   Upper Extremity Assessment Upper Extremity Assessment: Defer to OT evaluation    Lower Extremity Assessment Lower Extremity  Assessment: RLE deficits/detail;LLE deficits/detail RLE Deficits / Details: Profound weakness, Rt ankle contracted in PF. Trace quad activation. LLE Deficits / Details: Profound weakness, Lt knee contracted, able to activate quad without movement of distal limb. Slight adductor activity noted.    Cervical / Trunk Assessment Cervical / Trunk Assessment: Kyphotic  Communication   Communication Communication: No apparent difficulties    Cognition Arousal: Alert Behavior During Therapy: WFL for tasks assessed/performed   PT - Cognitive impairments: No apparent impairments                         Following commands: Intact       Cueing Cueing Techniques: Verbal cues, Gestural cues     General Comments General comments (skin integrity, edema, etc.): Heels floating, Knees supported laterally with towel roll to assist with neutral positioning.    Exercises     Assessment/Plan    PT Assessment Patient does not need any further PT services  PT Problem List         PT Treatment Interventions      PT Goals (Current goals can be found in the Care Plan section)  Acute Rehab PT Goals Patient Stated Goal: none stated PT Goal Formulation: All assessment and education complete, DC therapy    Frequency       Co-evaluation PT/OT/SLP Co-Evaluation/Treatment: Yes Reason for Co-Treatment: To address functional/ADL transfers PT goals addressed during session: Mobility/safety with mobility;Balance OT goals addressed during session: ADL's and self-care;Strengthening/ROM       AM-PAC PT 6 Clicks Mobility  Outcome Measure Help needed turning from your back to your side while in a flat bed without using bedrails?: A Little Help needed moving from lying on your back to sitting on the side of a flat bed without using bedrails?: Total Help needed moving to and from a bed to a chair (including a wheelchair)?: Total Help needed standing up from a chair using your arms (e.g.,  wheelchair or bedside chair)?: Total Help needed to walk in hospital room?: Total Help needed climbing 3-5 steps with a railing? : Total 6 Click Score: 8    End of Session   Activity Tolerance: Patient tolerated treatment well Patient left: in bed;with call bell/phone within reach;with bed alarm set Nurse Communication: Mobility status PT Visit Diagnosis: Other abnormalities of gait and mobility (R26.89);Muscle weakness (generalized) (M62.81);Difficulty in walking, not elsewhere classified (R26.2)    Time: 8781-8758 PT Time Calculation (min) (ACUTE ONLY): 23 min   Charges:   PT Evaluation $PT Eval Low Complexity: 1 Low   PT General Charges $$ ACUTE PT VISIT: 1 Visit         Leontine Roads, PT, DPT [facility]  Rehabilitation  Services Physical Therapist Office: [phone] Website: [email or web address]   Leontine GORMAN Roads 10/20/2023, 3:56 PM

## 2023-10-20 NOTE — Progress Notes (Signed)
 [facility]                                                                               Glenora Morocho, is a 53 y.o. female, DOB - 1970-12-17, FMW:[id number] Admit date - 10/17/2023    Outpatient Primary MD for the patient is [facility]  LOS - 2  days    Brief summary    TIMMY CLEVERLY is a 53 y.o. female with medical history significant of MS, and cirrhosis on admission imaging p/w stercoral colitis c/b R hydroureteronephrosis and acute urinary retention.   CT chest/abd/pelvis showed markedly dilated rectum filled with stool with diffuse rectal wall thickening and surrounding inflammation...compatible with stercoral colitis, and mild right-sided hydroureteronephrosis to the level of the distal ureter, likely secondary to compression from the rectum. She was admitted for evaluation of stercoral colitis.     Assessment & Plan    Assessment and Plan:  Stercoral colitis - CONTINUE with IV antibiotics.  - had 3 bowel movements today.  - advancing diet as tolerated.  - pain control.     Right hydronephrosis Urology consulted; recs: agree with foley placement; no need for R ureteral stent given mild hydronephrosis; repeat renal US  in 48h to eval for resolution of R hydronephrosis  US  renal ordered.     Cirrhosis Recommend outpatient follow up with GI  UTI Enterococcus and pseudomonas on urine cultures.  Pt on Unasyn , will add ciprofloxacin .   Hypokalemia Replaced. Repeat in am.    Microcytic anemia Hemoglobin around 8.2. check anemia panel.     Estimated body mass index is 25.73 kg/m as calculated from the following:   Height as of this encounter: 5' 2 (1.575 m).   Weight as of this encounter: 63.8 kg.  Code Status: full code.  DVT Prophylaxis:  SCDs Start: 10/18/23 0132   Level of Care: Level of care: Progressive Family Communication: none at bedside.   Disposition Plan:     Remains inpatient appropriate:  pending clinical  improvement.  Procedures:  None.   Consultants:   Wound care.   Antimicrobials:   Anti-infectives (From admission, onward)    Start     Dose/Rate Route Frequency Ordered Stop   10/19/23 1845  ampicillin -sulbactam (UNASYN ) 1.5 g in sodium chloride  0.9 % 100 mL IVPB        1.5 g 200 mL/hr over 30 Minutes Intravenous Every 6 hours 10/19/23 1746     10/18/23 1000  cefTRIAXone  (ROCEPHIN ) 2 g in sodium chloride  0.9 % 100 mL IVPB  Status:  Discontinued        2 g 200 mL/hr over 30 Minutes Intravenous Every 24 hours 10/18/23 0139 10/19/23 1746   10/18/23 0145  metroNIDAZOLE  (FLAGYL ) IVPB 500 mg        500 mg 100 mL/hr over 60 Minutes Intravenous 2 times daily 10/18/23 0139     10/17/23 2345  cefTRIAXone  (ROCEPHIN ) 1 g in sodium chloride  0.9 % 100 mL IVPB        1 g 200 mL/hr over 30 Minutes Intravenous  Once 10/17/23 2343 10/18/23 0045        Medications  Scheduled Meds:  baclofen   20  mg Oral TID   gabapentin   600 mg Oral TID   liver oil-zinc  oxide   Topical BID   pantoprazole   40 mg Oral Daily   senna-docusate  2 tablet Oral BID   Continuous Infusions:  ampicillin -sulbactam (UNASYN ) IV Stopped (10/20/23 1424)   metronidazole  Stopped (10/20/23 1053)   PRN Meds:.acetaminophen , melatonin, naLOXone  (NARCAN )  injection, ondansetron  (ZOFRAN ) IV, polyethylene glycol, traMADol     Subjective:   Tamla Winkels was seen and examined today.  No new complaints.   Objective:   Vitals:   10/20/23 0316 10/20/23 0500 10/20/23 0731 10/20/23 1102  BP: (!) 100/53  (!) 105/52 (!) 99/54  Pulse: 72 76 83   Resp: 12 13 15    Temp: 98.2 F (36.8 C)  97.9 F (36.6 C) 98.1 F (36.7 C)  TempSrc: Oral  Oral Oral  SpO2: 97% 100% 99%   Weight:  63.8 kg    Height:        Intake/Output Summary (Last 24 hours) at 10/20/2023 1459 Last data filed at 10/20/2023 1437 Gross per 24 hour  Intake 1060 ml  Output 200 ml  Net 860 ml   Filed Weights   10/19/23 0428 10/19/23 0635 10/20/23 0500   Weight: 59.3 kg 59.3 kg 63.8 kg     Exam General exam: Appears calm and comfortable  Respiratory system: Clear to auscultation. Respiratory effort normal. Cardiovascular system: S1 & S2 heard, RRR.  Gastrointestinal system: Abdomen is nondistended, soft and nontender.  Central nervous system: Alert and oriented.  Extremities: Symmetric 5 x 5 power. Skin: No rashes, Stage 4 Pressure Injury R glute and L ischium, R glute appears largely clean  Psychiatry:  Mood & affect appropriate.      Data Reviewed:  I have personally reviewed following labs and imaging studies   CBC Lab Results  Component Value Date   WBC 4.9 10/20/2023   RBC 3.60 (L) 10/20/2023   HGB 8.2 (L) 10/20/2023   HCT 28.5 (L) 10/20/2023   MCV 79.2 (L) 10/20/2023   MCH 22.8 (L) 10/20/2023   PLT 183 10/20/2023   MCHC 28.8 (L) 10/20/2023   RDW 18.0 (H) 10/20/2023   LYMPHSABS 2.3 10/20/2023   MONOABS 0.4 10/20/2023   EOSABS 0.2 10/20/2023   BASOSABS 0.0 10/20/2023     Last metabolic panel Lab Results  Component Value Date   NA 141 10/20/2023   K 3.3 (L) 10/20/2023   CL 110 10/20/2023   CO2 23 10/20/2023   BUN 13 10/20/2023   CREATININE 0.70 10/20/2023   GLUCOSE 100 (H) 10/20/2023   GFRNONAA >60 10/20/2023   GFRAA >60 02/02/2019   CALCIUM  8.3 (L) 10/20/2023   PHOS 2.6 06/14/2023   PROT 6.7 10/18/2023   ALBUMIN 2.6 (L) 10/18/2023   BILITOT 0.3 10/18/2023   ALKPHOS 80 10/18/2023   AST 27 10/18/2023   ALT 9 10/18/2023   ANIONGAP 8 10/20/2023    CBG (last 3)  No results for input(s): GLUCAP in the last 72 hours.    Coagulation Profile: Recent Labs  Lab 10/17/23 2210  INR 1.0     Radiology Studies: No results found.      Elgie Butter M.D. [facility] 10/20/2023, 2:59 PM  Available via Epic secure chat 7am-7pm After 7 pm, please refer to night coverage provider listed on amion.

## 2023-10-20 NOTE — Evaluation (Signed)
 Occupational Therapy Evaluation Patient Details Name: [name] MRN: [id number] DOB: December 05, 1970 Today's Date: 10/20/2023   History of Present Illness   53 yo female admission images (+) stercoral colitis c/b R hydroureteronephrosis and acute urinary retention PMH MS cirrhosis arthritis ORIF L ankle fixation     Clinical Impressions Patient evaluated by Occupational Therapy with no further acute OT needs identified. All education has been completed and the patient has no further questions. See below for any follow-up Occupational Therapy or equipment needs. OT to sign off. Thank you for referral.       If plan is discharge home, recommend the following:         Functional Status Assessment   Patient has had a recent decline in their functional status and demonstrates the ability to make significant improvements in function in a reasonable and predictable amount of time.     Equipment Recommendations   [employer];Hospital bed;Wheelchair cushion (measurements OT);Wheelchair (measurements OT);Other (comment) (air mattress)     Recommendations for Other Services         Precautions/Restrictions   Precautions Precautions: Fall;Other (comment) (increase pressure wound risk) Restrictions Weight Bearing Restrictions Per Provider Order: Yes Other Position/Activity Restrictions: pt has not weight beared in 10 years     Mobility Bed Mobility Overal bed mobility: Needs Assistance Bed Mobility: Rolling Rolling: Min assist         General bed mobility comments: pt rolling R and L with decreased clearance of buttock from surface with rolling. pt increased effort rolling toward the R side. pt reports this is baseline. pt tolerated full right chair position with L LE fully supported on bed surface and not flexed. Flexion only at the hips    Transfers                   General transfer comment: pt reports not being oOB in 10 years. Utilized bed to assess  tolerance for full right posture      Balance                                           ADL either performed or assessed with clinical judgement   ADL Overall ADL's : Needs assistance/impaired Eating/Feeding: Modified independent;Bed level   Grooming: Modified independent;Bed level   Upper Body Bathing: Set up   Lower Body Bathing: Maximal assistance   Upper Body Dressing : Set up   Lower Body Dressing: Maximal assistance                 General ADL Comments: pt rolling R and L with increased effort to the L . pt does not fully clear buttock with rolling. increased risk for skin break down     Vision Baseline Vision/History: 1 Wears glasses Vision Assessment?: No apparent visual deficits Additional Comments: wears glass all the time, deficits with seeing far away     Perception         Praxis         Pertinent Vitals/Pain Pain Assessment Pain Assessment: Faces Pain Score: 2  Faces Pain Scale: Hurts a little bit Pain Location: PROM of joints Pain Descriptors / Indicators: Tiring Pain Intervention(s): Repositioned, Monitored during session, Limited activity within patient's tolerance     Extremity/Trunk Assessment Upper Extremity Assessment Upper Extremity Assessment: Right hand dominant;Overall [facility] for tasks assessed   Lower Extremity Assessment Lower Extremity  Assessment: Defer to PT evaluation;LLE deficits/detail LLE Deficits / Details: contracture at the knee   Cervical / Trunk Assessment Cervical / Trunk Assessment: Kyphotic   Communication Communication Communication: No apparent difficulties   Cognition Arousal: Alert Behavior During Therapy: WFL for tasks assessed/performed Cognition: No apparent impairments                               Following commands: Intact       Cueing  General Comments      break down on buttock bil sides and L heel. pt declines pillow for heels. pt states doesnt work pt  with ankle contractures and edema.   Exercises Exercises: Other exercises Other Exercises Other Exercises: scapula retraction, bicep and tricep pulling herself foward with bed rails and clearing shoulders off surface x5 reps   Shoulder Instructions      Home Living Family/patient expects to be discharged to:: Skilled nursing facility Living Arrangements: Parent (mom/ dad) Available Help at Discharge: Family;Available 24 hours/day Type of Home: House Home Access: Ramped entrance     Home Layout: One level               Home Equipment: Hospital bed;Grab bars - toilet (hoyer lift, half rails, air mattress)   Additional Comments: reports mother takes care of her, has aide that paid to come 1x week self pay. mother 67 dad 31 yo unable to do the manual      Prior Functioning/Environment               Mobility Comments: does not get oob at all since 2015 ADLs Comments: bath UB bed level and she completes. pt able to roll over with UB only    OT Problem List:     OT Treatment/Interventions:        OT Goals(Current goals can be found in the care plan section)   Acute Rehab OT Goals Patient Stated Goal: to return home   OT Frequency:       Co-evaluation PT/OT/SLP Co-Evaluation/Treatment: Yes Reason for Co-Treatment: To address functional/ADL transfers   OT goals addressed during session: ADL's and self-care;Strengthening/ROM      AM-PAC OT 6 Clicks Daily Activity     Outcome Measure Help from another person eating meals?: None Help from another person taking care of personal grooming?: None Help from another person toileting, which includes using toliet, bedpan, or urinal?: A Lot Help from another person bathing (including washing, rinsing, drying)?: A Lot Help from another person to put on and taking off regular upper body clothing?: A Little Help from another person to put on and taking off regular lower body clothing?: Total 6 Click Score: 16   End  of Session Nurse Communication: Precautions;Need for lift equipment;Mobility status  Activity Tolerance: Patient tolerated treatment well Patient left: in bed;with call bell/phone within reach;with bed alarm set  OT Visit Diagnosis: Muscle weakness (generalized) (M62.81)                Time: 8781-8752 OT Time Calculation (min): 29 min Charges:  OT General Charges $OT Visit: 1 Visit OT Evaluation $OT Eval Moderate Complexity: 1 Mod   Brynn, OTR/L  Acute Rehabilitation Services Office: [phone] .   Ely Molt 10/20/2023, 1:25 PM

## 2023-10-21 DIAGNOSIS — R338 Other retention of urine: Secondary | ICD-10-CM | POA: Diagnosis not present

## 2023-10-21 DIAGNOSIS — K5289 Other specified noninfective gastroenteritis and colitis: Secondary | ICD-10-CM | POA: Diagnosis not present

## 2023-10-21 DIAGNOSIS — N1330 Unspecified hydronephrosis: Secondary | ICD-10-CM | POA: Diagnosis not present

## 2023-10-21 DIAGNOSIS — N390 Urinary tract infection, site not specified: Secondary | ICD-10-CM | POA: Diagnosis not present

## 2023-10-21 LAB — BASIC METABOLIC PANEL WITH GFR
Anion gap: 9 (ref 5–15)
BUN: 11 mg/dL (ref 6–20)
CO2: 23 mmol/L (ref 22–32)
Calcium: 8.2 mg/dL — ABNORMAL LOW (ref 8.9–10.3)
Chloride: 107 mmol/L (ref 98–111)
Creatinine, Ser: 0.67 mg/dL (ref 0.44–1.00)
GFR, Estimated: >60 mL/min (ref >60)
Glucose, Bld: 98 mg/dL (ref 70–99)
Potassium: 3.7 mmol/L (ref 3.5–5.1)
Sodium: 139 mmol/L (ref 135–145)

## 2023-10-21 MED ORDER — IRON SUCROSE 500 MG IVPB - SIMPLE MED
500.0000 mg | Freq: Once | INTRAVENOUS | Status: DC
  Filled 2023-10-21: qty 275

## 2023-10-21 MED ORDER — SODIUM CHLORIDE 0.9 % IV SOLN
500.0000 mg | Freq: Once | INTRAVENOUS | Status: AC
  Administered 2023-10-21 (×2): 500 mg via INTRAVENOUS
  Filled 2023-10-21: qty 25

## 2023-10-21 NOTE — Care Management Important Message (Signed)
 Important Message  Patient Details  Name: [name] MRN: [id number] Date of Birth: [birth year]   Important Message Given:  Yes - Medicare IM     Claretta Deed 10/21/2023, 4:18 PM

## 2023-10-21 NOTE — Care Management Important Message (Signed)
 Important Message  Patient Details  Name: [name] MRN: [id number] Date of Birth: 06/08/1970   Important Message Given:  Yes - Medicare IM     Claretta Deed 10/21/2023, 11:18 AM

## 2023-10-21 NOTE — Progress Notes (Addendum)
 [facility]                                                                               [name], is a 53 y.o. female, DOB - Apr 08, 1970, FMW:[id number] Admit date - 10/17/2023    Outpatient Primary MD for the patient is [name], [facility]  LOS - 3  days    Brief summary    [name] is a 53 y.o. female with medical history significant of MS, and cirrhosis on admission imaging p/w stercoral colitis c/b R hydroureteronephrosis and acute urinary retention.   CT chest/abd/pelvis showed markedly dilated rectum filled with stool with diffuse rectal wall thickening and surrounding inflammation...compatible with stercoral colitis, and mild right-sided hydroureteronephrosis to the level of the distal ureter, likely secondary to compression from the rectum. She was admitted for evaluation of stercoral colitis. She was started on IV antibiotics.     Assessment & Plan    Assessment and Plan:  Stercoral colitis Slowly improving with IV antibiotics. No nausea or vomiting . No BM today.  Abdominal pain has improved. Advanced diet and she is able to tolerate without any issues.    Right hydronephrosis Urology consulted; recs: agree with foley placement; no need for R ureteral stent given mild hydronephrosis; repeat renal US  in 48h to eval for resolution of R hydronephrosis . US  renal does not show any hydronephrosis.    Cirrhosis Recommend outpatient follow up with GI  UTI Enterococcus and pseudomonas on urine cultures.  Pt on Unasyn , will add ciprofloxacin .   Hypokalemia Replaced. Repeat in am wnl. .    Microcytic anemia Hemoglobin around 8.2.  Anemia panel shows low iron  and ferritin levels.  IV iron  ordered , recommend discharging patient on daily iron  supplementation.  Get stool for occult blood.   Therapy evaluations done and ? SNF   Estimated body mass index is 24.48 kg/m as calculated from the following:   Height as of this encounter: 5'  2 (1.575 m).   Weight as of this encounter: 60.7 kg.  Code Status: full code.  DVT Prophylaxis:  SCDs Start: 10/18/23 0132   Level of Care: Level of care: Progressive Family Communication: none at bedside.   Disposition Plan:     Remains inpatient appropriate:  possible d/c in am.   Procedures:  None.   Consultants:   None.   Antimicrobials:   Anti-infectives (From admission, onward)    Start     Dose/Rate Route Frequency Ordered Stop   10/20/23 2000  ciprofloxacin  (CIPRO ) tablet 500 mg        500 mg Oral 2 times daily 10/20/23 1513 10/25/23 1959   10/19/23 1845  ampicillin -sulbactam (UNASYN ) 1.5 g in sodium chloride  0.9 % 100 mL IVPB        1.5 g 200 mL/hr over 30 Minutes Intravenous Every 6 hours 10/19/23 1746 10/24/23 2359   10/18/23 1000  cefTRIAXone  (ROCEPHIN ) 2 g in sodium chloride  0.9 % 100 mL IVPB  Status:  Discontinued        2 g 200 mL/hr over 30 Minutes Intravenous Every 24 hours 10/18/23 0139 10/19/23 1746   10/18/23 0145  metroNIDAZOLE  (FLAGYL ) IVPB 500 mg  500 mg 100 mL/hr over 60 Minutes Intravenous 2 times daily 10/18/23 0139     10/17/23 2345  cefTRIAXone  (ROCEPHIN ) 1 g in sodium chloride  0.9 % 100 mL IVPB        1 g 200 mL/hr over 30 Minutes Intravenous  Once 10/17/23 2343 10/18/23 0045        Medications  Scheduled Meds:  baclofen   20 mg Oral TID   ciprofloxacin   500 mg Oral BID   gabapentin   600 mg Oral TID   liver oil-zinc  oxide   Topical BID   pantoprazole   40 mg Oral Daily   senna-docusate  2 tablet Oral BID   Continuous Infusions:  ampicillin -sulbactam (UNASYN ) IV 1.5 g (10/21/23 1056)   iron  sucrose (VENOFER ) 500 mg in sodium chloride  0.9 % 250 mL IVPB     metronidazole  500 mg (10/21/23 0856)   PRN Meds:.acetaminophen , melatonin, naLOXone  (NARCAN )  injection, ondansetron  (ZOFRAN ) IV, polyethylene glycol, traMADol     Subjective:   Bell Carbo was seen and examined today.  Some abdominal pain. Did not have a bowel  movement.   Objective:   Vitals:   10/21/23 0342 10/21/23 0500 10/21/23 0736 10/21/23 1104  BP: 117/64  125/76 112/64  Pulse: 85 86 96 (!) 109  Resp: 14 13 20 18   Temp: 98.5 F (36.9 C)  98.6 F (37 C) 98.7 F (37.1 C)  TempSrc: Oral  Oral Oral  SpO2: 98% 100% 100% 100%  Weight:  60.7 kg    Height:        Intake/Output Summary (Last 24 hours) at 10/21/2023 1545 Last data filed at 10/21/2023 0700 Gross per 24 hour  Intake 1020.14 ml  Output 1500 ml  Net -479.86 ml   Filed Weights   10/19/23 0635 10/20/23 0500 10/21/23 0500  Weight: 59.3 kg 63.8 kg 60.7 kg     Exam General exam: Appears calm and comfortable  Respiratory system: Clear to auscultation. Respiratory effort normal. Cardiovascular system: S1 & S2 heard, RRR.  Gastrointestinal system: Abdomen is nondistended, soft and nontender. Central nervous system: Alert and oriented.  Extremities: chronically contracted lower extremities.  Skin: Stage 4 Pressure Injury R glute and L ischium, R glute appears largely clean  Psychiatry: Mood & affect appropriate.      Data Reviewed:  I have personally reviewed following labs and imaging studies   CBC Lab Results  Component Value Date   WBC 4.9 10/20/2023   RBC 3.60 (L) 10/20/2023   HGB 8.2 (L) 10/20/2023   HCT 28.5 (L) 10/20/2023   MCV 79.2 (L) 10/20/2023   MCH 22.8 (L) 10/20/2023   PLT 183 10/20/2023   MCHC 28.8 (L) 10/20/2023   RDW 18.0 (H) 10/20/2023   LYMPHSABS 2.3 10/20/2023   MONOABS 0.4 10/20/2023   EOSABS 0.2 10/20/2023   BASOSABS 0.0 10/20/2023     Last metabolic panel Lab Results  Component Value Date   NA 139 10/21/2023   K 3.7 10/21/2023   CL 107 10/21/2023   CO2 23 10/21/2023   BUN 11 10/21/2023   CREATININE 0.67 10/21/2023   GLUCOSE 98 10/21/2023   GFRNONAA >60 10/21/2023   GFRAA >60 02/02/2019   CALCIUM  8.2 (L) 10/21/2023   PHOS 2.6 06/14/2023   PROT 6.7 10/18/2023   ALBUMIN 2.6 (L) 10/18/2023   BILITOT 0.3 10/18/2023   ALKPHOS  80 10/18/2023   AST 27 10/18/2023   ALT 9 10/18/2023   ANIONGAP 9 10/21/2023    CBG (last 3)  No results for input(s):  GLUCAP in the last 72 hours.    Coagulation Profile: Recent Labs  Lab 10/17/23 2210  INR 1.0     Radiology Studies: US  RENAL Result Date: 10/20/2023 CLINICAL DATA:  Hydronephrosis EXAM: RENAL / URINARY TRACT ULTRASOUND COMPLETE COMPARISON:  None Available. FINDINGS: Right Kidney: Renal measurements: 9.3 x 4.0 x 4.8 cm = volume: 94 mL. Mild to moderate renal cortical atrophy. Echogenicity within normal limits. No mass or hydronephrosis visualized. 3 cm simple exophytic cortical cyst arising from lower pole the right kidney which no follow-up imaging is recommended. Left Kidney: Renal measurements: 8.9 x 4.8 x 4.8 cm = volume: 109 mL. Mild-to-moderate renal cortical atrophy. Echogenicity within normal limits. No mass or hydronephrosis visualized. Bladder: Appears normal for degree of bladder distention. Other: None. IMPRESSION: 1. Mild-to-moderate bilateral renal cortical atrophy. No hydronephrosis. Electronically Signed   By: Dorethia Molt M.D.   On: 10/20/2023 20:45        Elgie Butter M.D. [facility] 10/21/2023, 3:45 PM  Available via Epic secure chat 7am-7pm After 7 pm, please refer to night coverage provider listed on amion.

## 2023-10-22 ENCOUNTER — Encounter (HOSPITAL_COMMUNITY): Payer: Self-pay | Admitting: Internal Medicine

## 2023-10-22 ENCOUNTER — Inpatient Hospital Stay (HOSPITAL_COMMUNITY)

## 2023-10-22 DIAGNOSIS — A419 Sepsis, unspecified organism: Secondary | ICD-10-CM | POA: Diagnosis not present

## 2023-10-22 DIAGNOSIS — R6520 Severe sepsis without septic shock: Secondary | ICD-10-CM

## 2023-10-22 DIAGNOSIS — K7460 Unspecified cirrhosis of liver: Secondary | ICD-10-CM

## 2023-10-22 DIAGNOSIS — R161 Splenomegaly, not elsewhere classified: Secondary | ICD-10-CM

## 2023-10-22 DIAGNOSIS — D509 Iron deficiency anemia, unspecified: Secondary | ICD-10-CM

## 2023-10-22 DIAGNOSIS — K5289 Other specified noninfective gastroenteritis and colitis: Secondary | ICD-10-CM | POA: Diagnosis not present

## 2023-10-22 DIAGNOSIS — K5641 Fecal impaction: Secondary | ICD-10-CM

## 2023-10-22 LAB — CBC WITH DIFFERENTIAL/PLATELET
Abs Immature Granulocytes: 0.03 K/uL (ref 0.00–0.07)
Basophils Absolute: 0 K/uL (ref 0.0–0.1)
Basophils Relative: 0 %
Eosinophils Absolute: 0 K/uL (ref 0.0–0.5)
Eosinophils Relative: 0 %
HCT: 30.7 % — ABNORMAL LOW (ref 36.0–46.0)
Hemoglobin: 8.6 g/dL — ABNORMAL LOW (ref 12.0–15.0)
Immature Granulocytes: 1 %
Lymphocytes Relative: 10 %
Lymphs Abs: 0.5 K/uL — ABNORMAL LOW (ref 0.7–4.0)
MCH: 22.2 pg — ABNORMAL LOW (ref 26.0–34.0)
MCHC: 28 g/dL — ABNORMAL LOW (ref 30.0–36.0)
MCV: 79.1 fL — ABNORMAL LOW (ref 80.0–100.0)
Monocytes Absolute: 0.4 K/uL (ref 0.1–1.0)
Monocytes Relative: 8 %
Neutro Abs: 4 K/uL (ref 1.7–7.7)
Neutrophils Relative %: 81 %
Platelets: 146 K/uL — ABNORMAL LOW (ref 150–400)
RBC: 3.88 MIL/uL (ref 3.87–5.11)
RDW: 17.6 % — ABNORMAL HIGH (ref 11.5–15.5)
WBC: 4.9 K/uL (ref 4.0–10.5)
nRBC: 0 % (ref 0.0–0.2)

## 2023-10-22 LAB — CULTURE, BLOOD (ROUTINE X 2)
Culture: NO GROWTH
Special Requests: ADEQUATE

## 2023-10-22 LAB — LACTIC ACID, PLASMA
Lactic Acid, Venous: 3.2 mmol/L (ref 0.5–1.9)
Lactic Acid, Venous: 3 mmol/L (ref 0.5–1.9)
Lactic Acid, Venous: 2.5 mmol/L (ref 0.5–1.9)
Lactic Acid, Venous: 2.6 mmol/L (ref 0.5–1.9)
Lactic Acid, Venous: 2.3 mmol/L (ref 0.5–1.9)

## 2023-10-22 MED ORDER — LACTATED RINGERS IV BOLUS
1000.0000 mL | Freq: Once | INTRAVENOUS | Status: AC
  Administered 2023-10-22 (×2): 1000 mL via INTRAVENOUS

## 2023-10-22 MED ORDER — PIPERACILLIN-TAZOBACTAM 3.375 G IVPB
3.3750 g | Freq: Three times a day (TID) | INTRAVENOUS | Status: DC
  Administered 2023-10-22 (×6): 3.375 g via INTRAVENOUS
  Filled 2023-10-22 (×4): qty 50

## 2023-10-22 MED ORDER — VANCOMYCIN HCL 1500 MG/300ML IV SOLN
1500.0000 mg | Freq: Once | INTRAVENOUS | Status: AC
  Administered 2023-10-22 (×2): 1500 mg via INTRAVENOUS
  Filled 2023-10-22: qty 300

## 2023-10-22 MED ORDER — ACETAMINOPHEN 325 MG PO TABS
650.0000 mg | ORAL_TABLET | Freq: Four times a day (QID) | ORAL | Status: DC | PRN
  Administered 2023-10-22 – 2023-10-27 (×3): 650 mg via ORAL
  Filled 2023-10-22: qty 2

## 2023-10-22 MED ORDER — LACTATED RINGERS IV BOLUS
500.0000 mL | Freq: Once | INTRAVENOUS | Status: AC
  Administered 2023-10-22 (×2): 500 mL via INTRAVENOUS

## 2023-10-22 MED ORDER — LACTATED RINGERS IV SOLN: INTRAVENOUS | Status: DC

## 2023-10-22 MED ORDER — SMOG ENEMA
400.0000 mL | Freq: Three times a day (TID) | RECTAL | Status: DC
  Administered 2023-10-22 (×2): 400 mL via RECTAL
  Filled 2023-10-22 (×3): qty 960

## 2023-10-22 MED ORDER — POLYETHYLENE GLYCOL 3350 17 G PO PACK
17.0000 g | PACK | Freq: Two times a day (BID) | ORAL | Status: DC
  Administered 2023-10-22 – 2023-10-23 (×5): 17 g via ORAL
  Filled 2023-10-22 (×3): qty 1

## 2023-10-22 MED ORDER — BISACODYL 10 MG RE SUPP
10.0000 mg | Freq: Every day | RECTAL | Status: AC
  Administered 2023-10-22 (×2): 10 mg via RECTAL
  Filled 2023-10-22 (×2): qty 1

## 2023-10-22 MED ORDER — VANCOMYCIN HCL 750 MG/150ML IV SOLN
750.0000 mg | Freq: Two times a day (BID) | INTRAVENOUS | Status: DC
  Administered 2023-10-22 (×2): 750 mg via INTRAVENOUS
  Filled 2023-10-22 (×2): qty 150

## 2023-10-22 NOTE — Progress Notes (Addendum)
 Pharmacy Antibiotic Note  [name] is a 53 y.o. female admitted on 10/17/2023 with Stercoral colitis/UTI on cipro /flagyl /unasyn , now with concerns for sepsis.  Pharmacy has been consulted for escalating antibiotics for zosyn /vancomycin  dosing.  -Tmax 102.8, lactate 3.2, hypotensive, tachycardic -Ucx: pan-sensitive PSA/enterococcus faecium  (R-macrobid) -Blood cultures NGTD  Plan: -D/c cipro /flagyl /unasyn  -Zosyn  3.375gm IV every 8 hours -Vancomycin  1500 mg IV x1 -Vancomycin  750mg  IV every 12 hours (AUC 539, IBW, Vd 0.72, sCr 0.8) -Monitor renal function -Follow up repeat blood cultures and abd imaging  -Follow up signs of clinical improvement, LOT, de-escalation of antibiotics   Height: 5' 2 (157.5 cm) Weight: 67.6 kg (149 lb 0.5 oz) IBW/kg (Calculated) : 50.1  Temp (24hrs), Avg:99.6 F (37.6 C), Min:98.5 F (36.9 C), Max:102.9 F (39.4 C)  Recent Labs  Lab 10/17/23 2210 10/17/23 2218 10/18/23 0153 10/18/23 0517 10/20/23 0243 10/21/23 0348 10/22/23 0238  WBC 7.4  --   --  6.5 4.9  --  4.9  CREATININE 0.61  --   --  0.53 0.70 0.67  --   LATICACIDVEN  --  1.9 1.1  --   --   --  3.2*    Estimated Creatinine Clearance: 72.5 mL/min (by C-G formula based on SCr of 0.67 mg/dL).    Allergies  Allergen Reactions   Tomato Rash and Other (See Comments)    Tongue turns white and cannot taste for at least 2 days.    Tizanidine Diarrhea    Antimicrobials this admission: Unasyn  8/10 >> 8/13 Ceftriaxone  8/9 >> 8/10 Flagyl  8/9 >> 8/12 Cipro  8/11 >> 8/12 Zosyn  8/13 >> Vancomycin  8/13 >>  Microbiology results: 8/13 BCx: collected 8/9 BCx: NGTD 8/9 UCx: E. Faecium (R-,macrobid), Pan snesitive PSA  8/9 MRSA PCR: not detected  Thank you for allowing pharmacy to be a part of this patient's care.  Lynwood Poplar, PharmD, BCPS Clinical Pharmacist 10/22/2023 6:34 AM

## 2023-10-22 NOTE — Hospital Course (Addendum)
 [name] is a 53 y.o. female with a history of multiple sclerosis, cirrhosis.  Patient presented secondary to fever and was found to have evidence of sterile coral colitis in addition to right hydroureteronephrosis and acute urinary retentio; urine studies suggestive of a urinary tract infection.  Patient started on antibiotics but developed severe sepsis physiology and repeat imaging concerning for possible osteomyelitis in addition to persistent stercoral colitis. Patient underwent aggressive bowel regimen with improvement of constipation. 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

## 2023-10-22 NOTE — Consult Note (Signed)
 [name] February 01, 1971  [id number].    Requesting MD: Dr. Elgin Lam Chief Complaint/Reason for Consult: stercoral colitis and sacral wound  HPI:  This is a pleasant 53 yo white female with a history of MS with paraplegia, who was admitted secondary to a UTI with R hydroureteronephrosis and urinary retention, likely secondary to her fecal burden in her rectum.  She has been started on IV abx for all of this.  She was also noted on that admission CT scan to have cirrhosis as well as stercoral colitis. She apparently developed a temp overnight of 102.9 with tachycardia and hypotension.  She had a repeat CT scan that revealed persistent stercoral colitis and B ischial decubitus ulceration with some concern for acute osteomyelitis.  She had a BM last night and one this morning during our exam.    Her HR is currently down to 103, temp 98.7, and BP 107/55.  She currently denies any abdominal pain.  She has been tolerating her normal diet.  She states she moves her bowels only once a week at home.  She states that she has had these wounds for over 2 years.  She follows with the wound care center and she has a nurse come out once a week to help her.  We have been asked to see her for evaluation of these two problems to see if these are a cause of her sepsis picture overnight.  ROS: ROS: see HPI  History reviewed. No pertinent family history.  Past Medical History:  Diagnosis Date   Arthritis    MS (multiple sclerosis) [facility])     Past Surgical History:  Procedure Laterality Date   HARDWARE REMOVAL Left 06/17/2023   Procedure: REMOVAL, HARDWARE LEFT ANKLE;  Surgeon: Burnetta Aures, MD;  Location: [facility] OR;  Service: Orthopedics;  Laterality: Left;   INCISION AND DRAINAGE OF WOUND Left 06/17/2023   Procedure: IRRIGATION AND DEBRIDEMENT WOUND;  Surgeon: Lowery Estefana RAMAN, DO;  Location: [facility] OR;  Service: Plastics;  Laterality: Left;  irrigation and debridement of left lower extremity wound with  placement of myriad   ORIF ANKLE FRACTURE Left 03/30/2013   Procedure: OPEN REDUCTION INTERNAL FIXATION (ORIF) LEFT ANKLE FRACTURE;  Surgeon: Norleen Armor, MD;  Location: [facility] OR;  Service: Orthopedics;  Laterality: Left;    Social History:  reports that she quit smoking about 15 years ago. Her smoking use included cigarettes. She started smoking about 35 years ago. She has a 40 pack-year smoking history. She quit smokeless tobacco use about 4 years ago. She reports that she does not drink alcohol and does not use drugs.  Allergies:  Allergies  Allergen Reactions   Tomato Rash and Other (See Comments)    Tongue turns white and cannot taste for at least 2 days.    Tizanidine Diarrhea    Medications Prior to Admission  Medication Sig Dispense Refill   acetaminophen  (TYLENOL ) 500 MG tablet Take 1,000 mg by mouth in the morning, at noon, and at bedtime.     ASHWAGANDHA GUMMIES PO Take 1 each by mouth at bedtime.     baclofen  (LIORESAL ) 20 MG tablet Take 20 mg by mouth 4 (four) times daily.     Bioflavonoid Products (SUPER-C 1000 PO) Take 3 each by mouth every morning. VITAFUSION SUPER C Gummies     gabapentin  (NEURONTIN ) 400 MG capsule Take 400 mg by mouth 4 (four) times daily. Take one capsule (400mg ) by mouth four times each day and one additional  capsule (400mg ) as needed.     liver oil-zinc  oxide (DESITIN) 40 % ointment Apply 1 Application topically as needed for irritation.     meloxicam  (MOBIC ) 15 MG tablet Take 15 mg by mouth daily.     mineral oil-hydrophilic petrolatum (AQUAPHOR) ointment Apply 1 Application topically as needed for irritation or dry skin.     Multiple Vitamins-Minerals (VITAFUSION MULTI WOMENS) CHEW Chew 2 each by mouth in the morning. VITAFUSION WOMEN'S MULTIVITAMIN GUMMIES     nitrofurantoin (MACRODANTIN) 50 MG capsule Take 50 mg by mouth daily.     omeprazole (PRILOSEC) 40 MG capsule Take 40 mg by mouth daily as needed (acid refluc, indigestion).     senna-docusate  (SENOKOT-S) 8.6-50 MG tablet Take 2 tablets by mouth at bedtime.     traMADol  (ULTRAM ) 50 MG tablet Take 1 tablet (50 mg total) by mouth every 12 (twelve) hours as needed for moderate pain. 15 tablet 0   Wound Dressings (TRIAD HYDROPHILIC WOUND DRESSI) PSTE Apply 1 Application topically as needed (Raw, burning, irritated skin).       Physical Exam: Blood pressure (!) 107/55, pulse (!) 103, temperature 98.7 F (37.1 C), temperature source Tympanic, resp. rate 16, height 5' 2 (1.575 m), weight 67.6 kg, SpO2 97%. General: pleasant, WD, WN white female who is laying in bed in NAD HEENT: head is normocephalic, atraumatic.  Sclera are noninjected.  PERRL.  Ears and nose without any masses or lesions.  Mouth is pink and moist Heart: regular rhythm, minimally tachy in low 100s.  Normal s1,s2. No obvious gallops or rubs noted, but + murmur.  Lungs: CTAB, no wheezes, rhonchi, or rales noted.  Respiratory effort nonlabored Abd: soft, NT, ND, +BS, no masses, hernias.  + BM on exam MS: BLE with atrophy c/w paraplegia.   Skin: warm and dry with no masses, lesions, or rashes.  Stage 4 right ischial wound that is 100% clean with beefy red granulation tissue.  Bone is palpable but covered with tissue and not visible. Psych: A&Ox3 with an appropriate affect.   Results for orders placed or performed during the hospital encounter of 10/17/23 (from the past 48 hours)  Vitamin B12     Status: None   Collection Time: 10/20/23  8:03 PM  Result Value Ref Range   Vitamin B-12 620 180 - 914 pg/mL    Comment: (NOTE) This assay is not validated for testing neonatal or myeloproliferative syndrome specimens for Vitamin B12 levels. Performed at [facility], [address], [place], [place] [address]   Iron  and TIBC     Status: Abnormal   Collection Time: 10/20/23  8:03 PM  Result Value Ref Range   Iron  11 (L) 28 - 170 ug/dL   TIBC 618 749 - 549 ug/dL   Saturation Ratios 3 (L) 10.4 - 31.8 %   UIBC 370  ug/dL    Comment: Performed at [facility], [address], [place], [place] [address]  Ferritin     Status: Abnormal   Collection Time: 10/20/23  8:03 PM  Result Value Ref Range   Ferritin 6 (L) 11 - 307 ng/mL    Comment: Performed at [facility], [address], [place], [place] [address]  Basic metabolic panel with GFR     Status: Abnormal   Collection Time: 10/21/23  3:48 AM  Result Value Ref Range   Sodium 139 135 - 145 mmol/L   Potassium 3.7 3.5 - 5.1 mmol/L   Chloride  107 98 - 111 mmol/L   CO2 23 22 - 32 mmol/L   Glucose, Bld 98 70 - 99 mg/dL    Comment: Glucose reference range applies only to samples taken after fasting for at least 8 hours.   BUN 11 6 - 20 mg/dL   Creatinine, Ser 9.32 0.44 - 1.00 mg/dL   Calcium  8.2 (L) 8.9 - 10.3 mg/dL   GFR, Estimated >39 >39 mL/min    Comment: (NOTE) Calculated using the CKD-EPI Creatinine Equation (2021)    Anion gap 9 5 - 15    Comment: Performed at [facility], [address] [place], [place] [address]  Lactic acid, plasma     Status: Abnormal   Collection Time: 10/22/23  2:38 AM  Result Value Ref Range   Lactic Acid, Venous 3.2 (HH) 0.5 - 1.9 mmol/L    Comment: CRITICAL RESULT CALLED TO, READ BACK BY AND VERIFIED WITH MANCIL BIRCH, RN [id number] 10/22/2023 SANDOVAL K Performed at [facility], [address] [place], [place] [address]   CBC with Differential/Platelet     Status: Abnormal   Collection Time: 10/22/23  2:38 AM  Result Value Ref Range   WBC 4.9 4.0 - 10.5 K/uL   RBC 3.88 3.87 - 5.11 MIL/uL   Hemoglobin 8.6 (L) 12.0 - 15.0 g/dL   HCT 69.2 (L) 63.9 - 53.9 %   MCV 79.1 (L) 80.0 - 100.0 fL   MCH 22.2 (L) 26.0 - 34.0 pg   MCHC 28.0 (L) 30.0 - 36.0 g/dL   RDW 82.3 (H) 88.4 - 84.4 %   Platelets 146 (L) 150 - 400 K/uL   nRBC 0.0 0.0 - 0.2 %   Neutrophils Relative % 81 %   Neutro Abs 4.0 1.7 - 7.7 K/uL   Lymphocytes Relative 10 %   Lymphs Abs 0.5 (L) 0.7 - 4.0 K/uL   Monocytes Relative 8 %    Monocytes Absolute 0.4 0.1 - 1.0 K/uL   Eosinophils Relative 0 %   Eosinophils Absolute 0.0 0.0 - 0.5 K/uL   Basophils Relative 0 %   Basophils Absolute 0.0 0.0 - 0.1 K/uL   Immature Granulocytes 1 %   Abs Immature Granulocytes 0.03 0.00 - 0.07 K/uL    Comment: Performed at [facility], [address] [place], [place] [address]  Lactic acid, plasma     Status: Abnormal   Collection Time: 10/22/23  5:33 AM  Result Value Ref Range   Lactic Acid, Venous 3.0 (HH) 0.5 - 1.9 mmol/L    Comment: CRITICAL VALUE NOTED. VALUE IS CONSISTENT WITH PREVIOUSLY REPORTED/CALLED VALUE Performed at [facility], [address], [place], [place] [address]    CT ABDOMEN PELVIS WO CONTRAST Result Date: 10/22/2023 EXAM: CT ABDOMEN AND PELVIS WITHOUT CONTRAST 10/22/2023 05:14:02 AM TECHNIQUE: CT of the abdomen and pelvis was performed without the administration of intravenous contrast. Multiplanar reformatted images are provided for review. Automated exposure control, iterative reconstruction, and/or weight based adjustment of the mA/kV was utilized to reduce the radiation dose to as low as reasonably achievable. COMPARISON: 10/17/2023 CLINICAL HISTORY: Abdominal pain, acute, nonlocalized. cirrhosis on admission imaging p/w stercoral colitis c/b R hydroureteronephrosis and acute urinary retention. FINDINGS: LOWER CHEST: No acute abnormality. LIVER: The contour of the liver is irregular and slightly nodular. Hypertrophy of the lateral segment of the left lobe of the liver is noted. No focal liver lesion identified. GALLBLADDER AND BILE DUCTS: Gallbladder sludge versus noncalcified stones noted within  the dependent portion of the gallbladder. No significant gallbladder wall thickening or pericholecystic inflammation. No signs of main duct dilatation, inflammation or mass. SPLEEN: No focal splenic abnormality. PANCREAS: No acute abnormality. ADRENAL GLANDS: The adrenal glands are normal. KIDNEYS, URETERS AND  BLADDER: Bilateral kidney stones. The right kidney stone measures 3 mm within the upper pole collecting system. Multiple left kidney stones are noted. The largest is in the inferior pole measuring 6 mm. Unchanged cyst within the anterior cortex of the interpolar right kidney measuring 2.4 cm. No follow up imaging recommended. Right-sided hydronephrosis has resolved in the interval. No left hydronephrosis. Urinary bladder appears normal. GI AND BOWEL: The stomach is non-distended. No pathologic dilatation of the large or small bowel loops. There are a few prominent small bowel loops with air-fluid levels noted in the right lower quadrant of the abdomen which measure up to 2.2 cm. Persistent moderate stool burden noted throughout the colon. The appendix is visualized and appears normal. A large volume of desiccated stool is identified within the rectum which is only mildly diminished when compared with the previous exam. Persistent circumferential wall thickening of the rectum with mild perirectal soft tissue haziness. PERITONEUM AND RETROPERITONEUM: No free fluid or fluid collections. No signs of pneumoperitoneum. VASCULATURE: Aortic atherosclerosis. Upper abdominal varicosities. LYMPH NODES: No abdominal or pelvic adenopathy. REPRODUCTIVE ORGANS: No acute abnormality. BONES AND SOFT TISSUES: Bilateral areas of ischial decubitus ulcerations are present, which extend up to the bone. This is similar to the previous exam. Asymmetric increased sclerosis involving the right ischial tuberosity is unchanged (image 114/3). Imaging findings most likely reflect sequelae of chronic osteomyelitis. There is loss of the cortex overlying the left ischial tuberosity, which is concerning for acute osteomyelitis (sagittal image 107/7). No associated fluid collections identified. IMPRESSION: 1. Persistent circumferential wall thickening of the rectum with mild perirectal soft tissue haziness, concerning for stercoral colitis. 2. Large  volume of retained stool throughout the colon compatible with constipation. 3. Bilateral areas of ischial decubitus ulcerations, similar to the previous exam Loss of the cortex overlying the left ischial tuberosity, concerning for acute osteomyelitis. No associated fluid collections identified. 4. Bilateral kidney stones. Right-sided hydronephrosis has resolved in the interval. 5. Morphologic features of the liver compatible with cirrhosis. Stigmata of portal venous hypertension noted, including upper abdominal varicosities. Electronically signed by: Waddell Calk MD 10/22/2023 06:07 AM EDT RP Workstation: [id number]   DG Abd 1 View Result Date: 10/22/2023 CLINICAL DATA:  Abdominal pain EXAM: ABDOMEN - 1 VIEW COMPARISON:  12/22/2020 FINDINGS: Scattered large and small bowel gas is noted. Fecal material is noted throughout the colon consistent with colonic constipation. Considerable fecal material is noted within the rectum consistent with impaction. No free air is seen. No bony abnormality is noted. IMPRESSION: Changes consistent with colonic constipation and rectal impaction. Electronically Signed   By: Oneil Devonshire M.D.   On: 10/22/2023 03:08   US  RENAL Result Date: 10/20/2023 CLINICAL DATA:  Hydronephrosis EXAM: RENAL / URINARY TRACT ULTRASOUND COMPLETE COMPARISON:  None Available. FINDINGS: Right Kidney: Renal measurements: 9.3 x 4.0 x 4.8 cm = volume: 94 mL. Mild to moderate renal cortical atrophy. Echogenicity within normal limits. No mass or hydronephrosis visualized. 3 cm simple exophytic cortical cyst arising from lower pole the right kidney which no follow-up imaging is recommended. Left Kidney: Renal measurements: 8.9 x 4.8 x 4.8 cm = volume: 109 mL. Mild-to-moderate renal cortical atrophy. Echogenicity within normal limits. No mass or hydronephrosis visualized. Bladder: Appears normal for degree of bladder  distention. Other: None. IMPRESSION: 1. Mild-to-moderate bilateral renal cortical atrophy. No  hydronephrosis. Electronically Signed   By: Dorethia Molt M.D.   On: 10/20/2023 20:45      Assessment/Plan R stage IV ischial tuberosity wound The patient has been seen, examined, labs, vitals, imaging, and chart reviewed.  This wound is 100% clean.  No evidence of purulent drainage or visible bone; however the bone is palpable underneath this tissue.  This wound is not the etiology of her change in vitals overnight.  CT suggest possible osteomyelitis of the ischium.  Will defer to ID regarding this.  Stercoral colitis The patient has a completely benign abdominal exam.  She has moved her bowels once last night and this morning on exam.  She has a large fecal burden on her CT scan.  Agree with BID miralax  and will given dulcolax suppository as well today and tomorrow to help get this moving to help take pressure off of her colonic mucosa to help with healing.  I also do not think this is the etiology of her sepsis picture from overnight as there is no evidence of perforation, she is tolerating a diet, and has no pain on abdominal exam.  WBC is normal at 4.9.  Discussed patient with primary service at the bedside and on secure chat.  No acute surgical interventions warranted for this patient.  We will be available as needed.   FEN - regular VTE - ok from our standpoint ID - [email or web address] /[email or web address]   MS Paraplegia UTI  I reviewed hospitalist notes, last 24 h vitals and pain scores, last 48 h intake and output, last 24 h labs and trends, and last 24 h imaging results.  Burnard FORBES Banter, [facility] 10/22/2023, 9:50 AM Please see Amion for pager number during day hours 7:00am-4:30pm or 7:00am -11:30am on weekends

## 2023-10-22 NOTE — Significant Event (Signed)
 Patient was noted to be getting tachycardic overnight with systolic blood pressure in 90s.  On exam at bedside patient is not in distress.  Abdomen appears mildly fullness in the right lower quadrant but not rigid.  I reviewed patient's labs and notes and medications.  Blood pressure is 98/70 heart rate is 125 tachycardic sinus rhythm temperature 102.9. HEENT anicteric no pallor. Heart S1-S2 heard. Chest bilaterally any present. Abdomen mild fullness in the right lower quadrant no guarding or rigidity.  Assessment and plan -   patient is getting febrile tachycardic blood pressure in the low normal with elevated lactic acid level concerning for developing sepsis.  I ordered repeat CT abdomen and pelvis.  Will broaden the the antibiotics get repeat blood cultures.  Will discuss with oncoming hospitalist.  [name]

## 2023-10-22 NOTE — Consult Note (Addendum)
 Consultation  Referring Provider: [facility]/ [facility] Primary Care Physician:  [facility] Primary Gastroenterologist:  unassigned  Reason for Consultation: Sepsis/stercoral colitis  HPI: [name] is a 53 y.o. female with history of MS, chronic sacral decubitus ulceration, immobility with bedbound status/paraplegia, and reported history of cirrhosis who lives at home with her parents and has a daily home health care. She was brought to the emergency room on 10/13/2023 with fever, and at that time a poor historian. Workup in the ER consistent with sepsis, elevated lactate at 1.9, and acute kidney injury. CT imaging on admit shows a nodular hepatic contour, moderately enlarged spleen bilateral renal calculi, mild right-sided hydro ureteral nephrosis to the level of the distal ureter likely secondary to compression from the rectum, no obstructing stone identified, no left-sided hydronephrosis.  Rectum is markedly dilated and filled with stool measuring up to 14 cm in diameter there is diffuse rectal wall thickening with mild surrounding inflammation no other dilated bowel loops, bilateral ischial decubitus ulcerations are present to the level of the bone not significantly changed no soft tissue gas.  Patient was started on IV Zosyn , blood cultures were done and negative Urine culture positive for Enterococcus and Pseudomonas  Patient did receive a fleets enema in the emergency room.  Does not appear that she has been getting further enemas since then, or bowel purge.  She had been clinically improving until early this morning when she was noted to be tachycardic, hypotensive and had a temp to 102.9.  Labs today with elevated lactate at 3.2 WBC 4.9/hemoglobin 8.6/hematocrit 30.7/MCV 79.1  Blood cultures drawn and pending Emit from yesterday sodium 139/potassium 3.7/BUN 11/creatinine 0.67 Ferritin 6/iron  11/TIBC 381/iron  sat 3.  Repeat CT of the abdomen and pelvis today-lower  chest no acute abnormality, irregular hepatic contour, gallbladder sludge versus noncalcified stones, no gallbladder wall thickening no ductal dilation. Right sided hydronephrosis has resolved no left hydronephrosis there is a few prominent loops of small bowel measuring up to 2.2 cm and assistant stool volume throughout the colon, large volume of desiccated stool identified within the rectum which is only mildly diminished compared to prior exam with persistent circumferential wall thickening of the rectum with perirectal soft tissue haziness. Bilateral areas of ischial decubiti with loss of cortex overlying the left ischial tuberosity concerning for acute osteomyelitis.  Blood pressures still soft but stable this afternoon currently 87 /60 pulse 103 Patient has been eating without difficulty.  She has no complaints of nausea or vomiting, no current complaint of abdominal pain, no dysuria.  Nursing reports she had a small bowel movement today.  She has no sense of any rectal pain or pressure, no cough or shortness of breath.  She is generally not on a bowel regimen at home, had been taking Senokot as needed. Reports no prior colonoscopy or GI evaluation.   Past Medical History:  Diagnosis Date   Arthritis    MS (multiple sclerosis) [facility])     Past Surgical History:  Procedure Laterality Date   HARDWARE REMOVAL Left 06/17/2023   Procedure: REMOVAL, HARDWARE LEFT ANKLE;  Surgeon: Burnetta Aures, MD;  Location: [facility] OR;  Service: Orthopedics;  Laterality: Left;   INCISION AND DRAINAGE OF WOUND Left 06/17/2023   Procedure: IRRIGATION AND DEBRIDEMENT WOUND;  Surgeon: Lowery Estefana RAMAN, DO;  Location: [facility] OR;  Service: Plastics;  Laterality: Left;  irrigation and debridement of left lower extremity wound with placement of myriad   ORIF ANKLE FRACTURE Left 03/30/2013   Procedure:  OPEN REDUCTION INTERNAL FIXATION (ORIF) LEFT ANKLE FRACTURE;  Surgeon: Norleen Armor, MD;  Location: [facility] OR;  Service:  Orthopedics;  Laterality: Left;    Prior to Admission medications   Medication Sig Start Date End Date Taking? Authorizing Provider  acetaminophen  (TYLENOL ) 500 MG tablet Take 1,000 mg by mouth in the morning, at noon, and at bedtime.   Yes [provider]  ASHWAGANDHA GUMMIES PO Take 1 each by mouth at bedtime.   Yes [provider]  baclofen  (LIORESAL ) 20 MG tablet Take 20 mg by mouth 4 (four) times daily. 11/05/18  Yes [provider]  Bioflavonoid Products (SUPER-C 1000 PO) Take 3 each by mouth every morning. VITAFUSION SUPER C Gummies   Yes [provider]  gabapentin  (NEURONTIN ) 400 MG capsule Take 400 mg by mouth 4 (four) times daily. Take one capsule (400mg ) by mouth four times each day and one additional capsule (400mg ) as needed. 08/21/23  Yes [provider]  liver oil-zinc  oxide (DESITIN) 40 % ointment Apply 1 Application topically as needed for irritation.   Yes [provider]  meloxicam  (MOBIC ) 15 MG tablet Take 15 mg by mouth daily. 09/19/23  Yes [provider]  mineral oil-hydrophilic petrolatum (AQUAPHOR) ointment Apply 1 Application topically as needed for irritation or dry skin.   Yes [provider]  Multiple Vitamins-Minerals (VITAFUSION MULTI WOMENS) CHEW Chew 2 each by mouth in the morning. VITAFUSION WOMEN'S MULTIVITAMIN GUMMIES   Yes [provider]  nitrofurantoin (MACRODANTIN) 50 MG capsule Take 50 mg by mouth daily. 09/03/23  Yes [provider]  omeprazole (PRILOSEC) 40 MG capsule Take 40 mg by mouth daily as needed (acid refluc, indigestion).   Yes [provider]  senna-docusate (SENOKOT-S) 8.6-50 MG tablet Take 2 tablets by mouth at bedtime.   Yes [provider]  traMADol  (ULTRAM ) 50 MG tablet Take 1 tablet (50 mg total) by mouth every 12 (twelve) hours as needed for moderate pain. 02/22/21  Yes Ghimire, Donalda HERO, MD  Wound Dressings (TRIAD HYDROPHILIC WOUND  DRESSI) PSTE Apply 1 Application topically as needed (Raw, burning, irritated skin).   Yes [provider]    Current Facility-Administered Medications  Medication Dose Route Frequency Provider Last Rate Last Admin   acetaminophen  (TYLENOL ) tablet 650 mg  650 mg Oral Q6H PRN Franky Redia SAILOR, MD   650 mg at 10/22/23 0217   baclofen  (LIORESAL ) tablet 20 mg  20 mg Oral TID Georgina Basket, MD   20 mg at 10/22/23 0842   bisacodyl  (DULCOLAX) suppository 10 mg  10 mg Rectal Daily Tammy Sor, PA-C   10 mg at 10/22/23 1056   gabapentin  (NEURONTIN ) capsule 600 mg  600 mg Oral TID Georgina Basket, MD   600 mg at 10/22/23 9158   lactated ringers  infusion   Intravenous Continuous Franky Redia SAILOR, MD 125 mL/hr at 10/22/23 1203 New Bag at 10/22/23 1203   liver oil-zinc  oxide (DESITIN) 40 % ointment   Topical BID Akula, Vijaya, MD   Given at 10/22/23 0845   melatonin tablet 3 mg  3 mg Oral QHS PRN Howerter, Justin B, DO   3 mg at 10/21/23 2302   naloxone  (NARCAN ) injection 0.4 mg  0.4 mg Intravenous PRN Howerter, Justin B, DO       ondansetron  (ZOFRAN ) injection 4 mg  4 mg Intravenous Q6H PRN Howerter, Justin B, DO   4 mg at 10/22/23 0019   pantoprazole  (PROTONIX ) EC tablet 40 mg  40 mg Oral Daily  Georgina Basket, MD   40 mg at 10/22/23 9157   piperacillin -tazobactam (ZOSYN ) IVPB 3.375 g  3.375 g Intravenous Q8H Laron Agent, RPH 12.5 mL/hr at 10/22/23 1105 Infusion Verify at 10/22/23 1105   polyethylene glycol (MIRALAX  / GLYCOLAX ) packet 17 g  17 g Oral BID Briana Elgin LABOR, MD   17 g at 10/22/23 9158   senna-docusate (Senokot-S) tablet 2 tablet  2 tablet Oral BID Georgina Basket, MD   2 tablet at 10/22/23 9157   traMADol  (ULTRAM ) tablet 50 mg  50 mg Oral Q6H PRN Georgina Basket, MD   50 mg at 10/22/23 1055   vancomycin  (VANCOREADY) IVPB 750 mg/150 mL  750 mg Intravenous Q12H Laron Agent, [facility]        Allergies as of 10/17/2023 - Review Complete 10/17/2023  Allergen Reaction Noted   Silver  Other (See Comments) 12/12/2020   Tizanidine Diarrhea 01/25/2019   Tomato Rash 01/27/2019    History reviewed. No pertinent family history.  Social History   Socioeconomic History   Marital status: Single    Spouse name: Not on file   Number of children: Not on file   Years of education: Not on file   Highest education level: Not on file  Occupational History   Not on file  Tobacco Use   Smoking status: Former    Current packs/day: 0.00    Average packs/day: 2.0 packs/day for 20.0 years (40.0 ttl pk-yrs)    Types: Cigarettes    Start date: 07/03/1988    Quit date: 07/03/2008    Years since quitting: 15.3   Smokeless tobacco: Former    Quit date: 01/10/2019   Tobacco comments:    USES ELECTRONIC CIGARETTES/ quit 01/2019  Substance and Sexual Activity   Alcohol use: No   Drug use: No   Sexual activity: Not Currently  Other Topics Concern   Not on file  Social History Narrative   Not on file   Social Drivers of Health   Financial Resource Strain: Not on file  Food Insecurity: No Food Insecurity (10/19/2023)   Hunger Vital Sign    Worried About Running Out of Food in the Last Year: Never true    Ran Out of Food in the Last Year: Never true  Transportation Needs: No Transportation Needs (10/19/2023)   PRAPARE - Administrator, Civil Service (Medical): No    Lack of Transportation (Non-Medical): No  Physical Activity: Not on file  Stress: Not on file  Social Connections: Not on file  Intimate Partner Violence: Not At Risk (10/19/2023)   Humiliation, Afraid, Rape, and Kick questionnaire    Fear of Current or Ex-Partner: No    Emotionally Abused: No    Physically Abused: No    Sexually Abused: No    Review of Systems: Pertinent positive and negative review of systems were noted in the above HPI section.  All other review of systems was otherwise negative.   Physical Exam: Vital signs in last 24 hours: Temp:  [98.5 F (36.9 C)-102.9 F (39.4 C)] 98.8 F  (37.1 C) (08/13 1052) Pulse Rate:  [99-127] 106 (08/13 1203) Resp:  [12-20] 19 (08/13 1052) BP: (82-160)/(49-80) 82/49 (08/13 1205) SpO2:  [94 %-100 %] 100 % (08/13 1203) Weight:  [67.6 kg] 67.6 kg (08/13 0441) Last BM Date : 10/22/23 General:   Alert,  Well-developed, well-nourished, older white female pleasant and cooperative in NAD Head:  Normocephalic and atraumatic. Eyes:  Sclera clear, no icterus.   Conjunctiva pink.  Ears:  Normal auditory acuity. Nose:  No deformity, discharge,  or lesions. Mouth:  No deformity or lesions.   Neck:  Supple; no masses or thyromegaly. Lungs:  Clear throughout to auscultation.   No wheezes, crackles, or rhonchi.  Heart: Tachy regular rate and rhythm; no murmurs, clicks, rubs,  or gallops. Abdomen:  Soft,nontender, BS active, there is significant fullness/mass effect almost to the umbilicus midline and to the right of midline nontender.   Rectal: Not done Msk:  Symmetrical without gross deformities. . Pulses:  Normal pulses noted. Extremities:  Without clubbing or edema. Neurologic:  Alert and  oriented x4;   Skin:  Intact without significant lesions or rashes.. Psych:  Alert and cooperative. Normal mood and affect.  Intake/Output from previous day: 08/12 0701 - 08/13 0700 In: 1461.3 [P.O.:360; IV Piggyback:1101.3] Out: 800 [Urine:800] Intake/Output this shift: Total I/O In: 307.9 [I.V.:0.1; IV Piggyback:307.9] Out: -   Lab Results: Recent Labs    10/20/23 0243 10/22/23 0238  WBC 4.9 4.9  HGB 8.2* 8.6*  HCT 28.5* 30.7*  PLT 183 146*   BMET Recent Labs    10/20/23 0243 10/21/23 0348  NA 141 139  K 3.3* 3.7  CL 110 107  CO2 23 23  GLUCOSE 100* 98  BUN 13 11  CREATININE 0.70 0.67  CALCIUM  8.3* 8.2*   LFT No results for input(s): PROT, ALBUMIN, AST, ALT, ALKPHOS, BILITOT, BILIDIR, IBILI in the last 72 hours. PT/INR No results for input(s): LABPROT, INR in the last 72 hours. Hepatitis Panel No results  for input(s): HEPBSAG, HCVAB, HEPAIGM, HEPBIGM in the last 72 hours.    IMPRESSION:  #[age] year old white female with long history of multiple sclerosis, paraplegia, bedbound at home, with chronic sacral decubiti who was admitted 4 days ago with fever.  Initially met sepsis criteria with elevated lactate, blood cultures were negative Covered with broad-spectrum antibiotics, currently on Zosyn   Urine culture positive for Enterococcus and Pseudomonas  Initial CT imaging with mild right hydronephrosis felt secondary to large fecal impaction and evidence of stercoral colitis with diffuse wall thickening of the rectum and surrounding inflammation as well as a colon full of stool.  Also with bilateral nonobstructing renal calculi and stable ischial decubitus ulcerations.  Now with recurrent fever to 102.9 this morning, associated with tachycardia and hypotension.  Repeat imaging-CT without contrast shows resolution of the right hydronephrosis but continued evidence of fecal impaction and significant stool burden as well as continued changes of stercoral colitis with rectal wall thickening.  There is also concern for acute osteomyelitis involving the left ischial tuberosity.  Patient was seen by surgery this morning who clinically did not feel that she had evidence of osteomyelitis, and with benign abdominal exam were less concerned about this being of abdominal etiology.  With persistence of stercoral colitis and persistence of fecal impaction certainly she is at risk of bacteremia secondary to bacterial translocation bowel.  #2 anemia, iron  deficiency-etiology, previously felt to have anemia of chronic disease No prior endoscopic evaluation  #3 history of recurrent UTIs and urinary retention   PLAN: Continues on Zosyn  Follow-up blood cultures Patient needs fecal disimpaction, and likely several enemas to clear fecal impaction, then will need a bowel purge  tomorrow Will need  dedicated bowel regimen on discharge-probably MiraLAX  17 g in 8 ounces of water twice daily  Query if she needs any further imaging to rule out acute osteomyelitis Receiving IV Venofer  today Stool will very likely be heme positive in setting of  the stercoral colitis, unclear whether her iron  deficiency anemia is related to chronic GI losses or not.  Once she is over this acute illness she may need to undergo endoscopic evaluation GI will follow with you    Amy Esterwood PA-C 10/22/2023, 1:04 PM    Attending physician's note   I have taken history, reviewed the chart and examined the patient. I performed a substantive portion of this encounter, including complete performance of at least one of the key components, in conjunction with the APP. I agree with the Advanced Practitioner's note, impression and recommendations.   53yr old bedbound pt with MS/paraplegia with decubitus ulcer with urosepsis/R hydronephrosis d/t fecal impaction. Rectum 14 cm with rectal wall thickening on CT s/o stercoral proctitis. No obstruction  Unchanged liver cirrhosis with mild to moderate splenomegaly (likely d/t MASLD). No obvious varices. No ascites or HE.  Plan: - Fecal disimpaction protocol-smog enemas, p.o. MiraLAX  - FU labs/BC/lactate level. - Follow serial X Ray KUBs - Hold off on FS/colon at this time. Will consider only if active bleeding. - D/W pt and nursing staff.  CT AP x 2 reviewed - from adm and today's CT   Anselm Bring, MD [facility] [phone]

## 2023-10-22 NOTE — Progress Notes (Signed)
 PROGRESS NOTE    [name]  FMW:[id number] DOB: 06/21/1970 DOA: 10/17/2023 PCP: [facility]   Brief Narrative: [name] is a 53 y.o. female with a history of multiple sclerosis, cirrhosis.  Patient presented secondary to fever and was found to have evidence of sterile coral colitis in addition to right hydroureteronephrosis and acute urinary retentio; urine studies suggestive of a urinary tract infection.  Patient started on antibiotics but developed severe sepsis physiology and repeat imaging concerning for possible osteomyelitis in addition to persistent stercoral colitis.   Assessment and Plan:  Stercoral colitis Present on admission and presumed secondary to severe constipation. Diagnosed via CT imaging. Patient started on empiric antibiotics. General surgery consulted and evaluated, in setting of sepsis, with no evidence of peritonitis and no need for surgical management; GI consult recommended. -MiraLAX  -GI consultation  Severe sepsis Not present on admission. Possibly secondary to stercoral colitis, although there is possible ischial osteomyelitis noted on CT imaging. Hypotension and lactic acidosis present. Repeat blood cultures obtained (8/13) and antibiotics broadened to Vancomycin  and Zosyn  IV. Abdomen not consistent with peritonitis. Hypotension is, so far, responding to IV fluid boluses. -Continue antibiotics -Follow-up blood cultures -Trend BPs -Continue IV fluids -Trend lactic acid  Possible ischial osteomyelitis Noted on CT imaging (8/13). On empiric antibiotics for sepsis. May need MRI to confirm. Patient with a known and chronic (over 2 years) buttock wound and is followed by wound care as an outpatient. Patient started empirically on vancomycin  and Zosyn  for sepsis physiology. -Consult ID for recommendations  Constipation Persistent constipation noted on CT imaging. Per nursing, patient has had daily bowel movements. -MiraLAX  BID -Senokot-S  BID  Right hydronephrosis, resolved Noted on CT imaging. No associated AKI. Foley catheter placed. Urology consulted with recommendation for follow-up renal ultrasound, which was performed on 8/11 and negative for hydronephrosis.  UTI, resolved Urine culture significant for Enterococcus Faecium and Pseudomonas Aeruginosa. Patient completed treatment with Unasyn  and Ciprofloxacin .  Cirrhosis Portal hypertension Noted. Patient will need outpatient GI follow-up.  Hypokalemia Resolved with repletion.  Microcytic anemia Baseline hemoglobin of around 10-11. Hemoglobin of 9.1 on admission and has drifted down slightly. Patient's anemia panel suggests iron  deficiency. IV iron  ordered while inpatient.  Pressure injury Right/upper buttocks. Present on admission.   DVT prophylaxis: SCDs Code Status:   Code Status: Full Code Family Communication: None at bedside Disposition Plan: Discharge likely to SNF pending continued medical workup/management of sepsis   Consultants:  Urology General surgery Gastroenterology Infectious disease  Procedures:    Antimicrobials: Ceftriaxone  Flagyl  Unasyn  Ciprofloxacin  Vancomycin  Zosyn     Subjective: Patient reports feeling better than overnight. She reports overnight she had chills and feeling of fever.   Objective: BP (!) 98/53 (BP Location: Left Arm)   Pulse (!) 104   Temp 98.7 F (37.1 C) (Oral)   Resp 15   Ht 5' 2 (1.575 m)   Wt 67.6 kg   SpO2 94%   BMI 27.26 kg/m   Examination:  General exam: Appears calm and comfortable Respiratory system: Clear to auscultation. Respiratory effort normal. Cardiovascular system: S1 & S2 heard, RRR. No murmurs. Gastrointestinal system: Abdomen is nondistended, soft and nontender. Normal bowel sounds heard. Central nervous system: Alert and oriented. No focal neurological deficits. Psychiatry: Judgement and insight appear normal. Mood & affect appropriate.    Data Reviewed: I have  personally reviewed following labs and imaging studies  CBC Lab Results  Component Value Date   WBC 4.9 10/22/2023   RBC 3.88  10/22/2023   HGB 8.6 (L) 10/22/2023   HCT 30.7 (L) 10/22/2023   MCV 79.1 (L) 10/22/2023   MCH 22.2 (L) 10/22/2023   PLT 146 (L) 10/22/2023   MCHC 28.0 (L) 10/22/2023   RDW 17.6 (H) 10/22/2023   LYMPHSABS 0.5 (L) 10/22/2023   MONOABS 0.4 10/22/2023   EOSABS 0.0 10/22/2023   BASOSABS 0.0 10/22/2023     Last metabolic panel Lab Results  Component Value Date   NA 139 10/21/2023   K 3.7 10/21/2023   CL 107 10/21/2023   CO2 23 10/21/2023   BUN 11 10/21/2023   CREATININE 0.67 10/21/2023   GLUCOSE 98 10/21/2023   GFRNONAA >60 10/21/2023   GFRAA >60 02/02/2019   CALCIUM  8.2 (L) 10/21/2023   PHOS 2.6 06/14/2023   PROT 6.7 10/18/2023   ALBUMIN 2.6 (L) 10/18/2023   BILITOT 0.3 10/18/2023   ALKPHOS 80 10/18/2023   AST 27 10/18/2023   ALT 9 10/18/2023   ANIONGAP 9 10/21/2023    GFR: Estimated Creatinine Clearance: 72.5 mL/min (by C-G formula based on SCr of 0.67 mg/dL).  Recent Results (from the past 240 hours)  Blood Culture (routine x 2)     Status: None   Collection Time: 10/17/23 10:10 PM   Specimen: BLOOD RIGHT ARM  Result Value Ref Range Status   Specimen Description BLOOD RIGHT ARM  Final   Special Requests   Final    BOTTLES DRAWN AEROBIC AND ANAEROBIC Blood Culture adequate volume   Culture   Final    NO GROWTH 5 DAYS Performed at [facility], [address], [place], [place] [address]    Report Status 10/22/2023 FINAL  Final  Blood Culture (routine x 2)     Status: None (Preliminary result)   Collection Time: 10/17/23 10:35 PM   Specimen: BLOOD RIGHT ARM  Result Value Ref Range Status   Specimen Description BLOOD RIGHT ARM  Final   Special Requests   Final    BOTTLES DRAWN AEROBIC AND ANAEROBIC Blood Culture adequate volume   Culture   Final    NO GROWTH 4 DAYS Performed at [facility], [address]  [place], [place] [address]    Report Status PENDING  Incomplete  Urine Culture     Status: Abnormal   Collection Time: 10/18/23 11:10 AM   Specimen: Urine, Random  Result Value Ref Range Status   Specimen Description URINE, RANDOM  Final   Special Requests   Final    NONE Reflexed from [id number] Performed at [facility], [address] [place], [place] [address]    Culture (A)  Final    >=100,000 COLONIES/mL ENTEROCOCCUS FAECIUM 30,000 COLONIES/mL PSEUDOMONAS AERUGINOSA    Report Status 10/20/2023 FINAL  Final   Organism ID, Bacteria ENTEROCOCCUS FAECIUM (A)  Final   Organism ID, Bacteria PSEUDOMONAS AERUGINOSA (A)  Final      Susceptibility   Enterococcus faecium - MIC*    AMPICILLIN  <=2 SENSITIVE Sensitive     NITROFURANTOIN 256 RESISTANT Resistant     VANCOMYCIN  <=0.5 SENSITIVE Sensitive     * >=100,000 COLONIES/mL ENTEROCOCCUS FAECIUM   Pseudomonas aeruginosa - MIC*    CEFTAZIDIME 4 SENSITIVE Sensitive     CIPROFLOXACIN  <=0.25 SENSITIVE Sensitive     GENTAMICIN 4 SENSITIVE Sensitive     IMIPENEM 1 SENSITIVE Sensitive     PIP/TAZO <=4 SENSITIVE Sensitive ug/mL    CEFEPIME  2 SENSITIVE Sensitive     * 30,000 COLONIES/mL PSEUDOMONAS AERUGINOSA  MRSA Next Gen by PCR, Nasal     Status: None   Collection Time: 10/18/23  8:22 PM   Specimen: Nasal Mucosa; Nasal Swab  Result Value Ref Range Status   MRSA by PCR Next Gen NOT DETECTED NOT DETECTED Final    Comment: (NOTE) The GeneXpert MRSA Assay (FDA approved for NASAL specimens only), is one component of a comprehensive MRSA colonization surveillance program. It is not intended to diagnose MRSA infection nor to guide or monitor treatment for MRSA infections. Test performance is not FDA approved in patients less than 55 years old. Performed at [facility], [address], [place], [place] [address]       Radiology Studies: CT ABDOMEN PELVIS WO CONTRAST Result Date: 10/22/2023 EXAM: CT ABDOMEN AND PELVIS WITHOUT  CONTRAST 10/22/2023 05:14:02 AM TECHNIQUE: CT of the abdomen and pelvis was performed without the administration of intravenous contrast. Multiplanar reformatted images are provided for review. Automated exposure control, iterative reconstruction, and/or weight based adjustment of the mA/kV was utilized to reduce the radiation dose to as low as reasonably achievable. COMPARISON: 10/17/2023 CLINICAL HISTORY: Abdominal pain, acute, nonlocalized. cirrhosis on admission imaging p/w stercoral colitis c/b R hydroureteronephrosis and acute urinary retention. FINDINGS: LOWER CHEST: No acute abnormality. LIVER: The contour of the liver is irregular and slightly nodular. Hypertrophy of the lateral segment of the left lobe of the liver is noted. No focal liver lesion identified. GALLBLADDER AND BILE DUCTS: Gallbladder sludge versus noncalcified stones noted within the dependent portion of the gallbladder. No significant gallbladder wall thickening or pericholecystic inflammation. No signs of main duct dilatation, inflammation or mass. SPLEEN: No focal splenic abnormality. PANCREAS: No acute abnormality. ADRENAL GLANDS: The adrenal glands are normal. KIDNEYS, URETERS AND BLADDER: Bilateral kidney stones. The right kidney stone measures 3 mm within the upper pole collecting system. Multiple left kidney stones are noted. The largest is in the inferior pole measuring 6 mm. Unchanged cyst within the anterior cortex of the interpolar right kidney measuring 2.4 cm. No follow up imaging recommended. Right-sided hydronephrosis has resolved in the interval. No left hydronephrosis. Urinary bladder appears normal. GI AND BOWEL: The stomach is non-distended. No pathologic dilatation of the large or small bowel loops. There are a few prominent small bowel loops with air-fluid levels noted in the right lower quadrant of the abdomen which measure up to 2.2 cm. Persistent moderate stool burden noted throughout the colon. The appendix is  visualized and appears normal. A large volume of desiccated stool is identified within the rectum which is only mildly diminished when compared with the previous exam. Persistent circumferential wall thickening of the rectum with mild perirectal soft tissue haziness. PERITONEUM AND RETROPERITONEUM: No free fluid or fluid collections. No signs of pneumoperitoneum. VASCULATURE: Aortic atherosclerosis. Upper abdominal varicosities. LYMPH NODES: No abdominal or pelvic adenopathy. REPRODUCTIVE ORGANS: No acute abnormality. BONES AND SOFT TISSUES: Bilateral areas of ischial decubitus ulcerations are present, which extend up to the bone. This is similar to the previous exam. Asymmetric increased sclerosis involving the right ischial tuberosity is unchanged (image 114/3). Imaging findings most likely reflect sequelae of chronic osteomyelitis. There is loss of the cortex overlying the left ischial tuberosity, which is concerning for acute osteomyelitis (sagittal image 107/7). No associated fluid collections identified. IMPRESSION: 1. Persistent circumferential wall thickening of the rectum with mild perirectal soft tissue haziness, concerning for stercoral colitis. 2. Large volume of retained stool throughout the colon compatible with constipation. 3. Bilateral areas of ischial decubitus ulcerations, similar to  the previous exam Loss of the cortex overlying the left ischial tuberosity, concerning for acute osteomyelitis. No associated fluid collections identified. 4. Bilateral kidney stones. Right-sided hydronephrosis has resolved in the interval. 5. Morphologic features of the liver compatible with cirrhosis. Stigmata of portal venous hypertension noted, including upper abdominal varicosities. Electronically signed by: Waddell Calk MD 10/22/2023 06:07 AM EDT RP Workstation: [id number]   DG Abd 1 View Result Date: 10/22/2023 CLINICAL DATA:  Abdominal pain EXAM: ABDOMEN - 1 VIEW COMPARISON:  12/22/2020 FINDINGS: Scattered  large and small bowel gas is noted. Fecal material is noted throughout the colon consistent with colonic constipation. Considerable fecal material is noted within the rectum consistent with impaction. No free air is seen. No bony abnormality is noted. IMPRESSION: Changes consistent with colonic constipation and rectal impaction. Electronically Signed   By: Oneil Devonshire M.D.   On: 10/22/2023 03:08   US  RENAL Result Date: 10/20/2023 CLINICAL DATA:  Hydronephrosis EXAM: RENAL / URINARY TRACT ULTRASOUND COMPLETE COMPARISON:  None Available. FINDINGS: Right Kidney: Renal measurements: 9.3 x 4.0 x 4.8 cm = volume: 94 mL. Mild to moderate renal cortical atrophy. Echogenicity within normal limits. No mass or hydronephrosis visualized. 3 cm simple exophytic cortical cyst arising from lower pole the right kidney which no follow-up imaging is recommended. Left Kidney: Renal measurements: 8.9 x 4.8 x 4.8 cm = volume: 109 mL. Mild-to-moderate renal cortical atrophy. Echogenicity within normal limits. No mass or hydronephrosis visualized. Bladder: Appears normal for degree of bladder distention. Other: None. IMPRESSION: 1. Mild-to-moderate bilateral renal cortical atrophy. No hydronephrosis. Electronically Signed   By: Dorethia Molt M.D.   On: 10/20/2023 20:45      LOS: 4 days   CRITICAL CARE Performed by: Elgin Lam   Total critical care time: 40 minutes  Critical care time was exclusive of separately billable procedures and treating other patients.  Critical care was necessary to treat or prevent imminent or life-threatening deterioration.  Critical care was time spent personally by me on the following activities: development of treatment plan with patient and/or surrogate as well as nursing, discussions with consultants, evaluation of patient's response to treatment, examination of patient, obtaining history from patient or surrogate, ordering and performing treatments and interventions, ordering and  review of laboratory studies, ordering and review of radiographic studies, pulse oximetry and re-evaluation of patient's condition.   Elgin Lam, MD [facility] 10/22/2023, 7:20 AM   If 7PM-7AM, please contact night-coverage [email or web address]

## 2023-10-22 NOTE — Progress Notes (Signed)
   10/22/23 0212  Assess: MEWS Score  Temp (!) 102.9 F (39.4 C)  BP (!) 160/74  MAP (mmHg) 96  Pulse Rate (!) 125  ECG Heart Rate (!) 125  Resp 20  Level of Consciousness Alert  SpO2 94 %  O2 Device Room Air  Assess: MEWS Score  MEWS Temp 2  MEWS Systolic 0  MEWS Pulse 2  MEWS RR 0  MEWS LOC 0  MEWS Score 4  MEWS Score Color Red  Assess: if the MEWS score is Yellow or Red  Were vital signs accurate and taken at a resting state? Yes  Does the patient meet 2 or more of the SIRS criteria? Yes  Does the patient have a confirmed or suspected source of infection? Yes  MEWS guidelines implemented  Yes, red  Treat  MEWS Interventions Considered administering scheduled or prn medications/treatments as ordered  Take Vital Signs  Increase Vital Sign Frequency  Red: Q1hr x2, continue Q4hrs until patient remains green for 12hrs  Escalate  MEWS: Escalate Red: Discuss with charge nurse and notify provider. Consider notifying RRT. If remains red for 2 hours consider need for higher level of care  Notify: Charge Nurse/RN  Name of Charge Nurse/RN Notified Fairy, RN  Provider Notification  Provider Name/Title Lenward, MD  Date Provider Notified 10/22/23  Time Provider Notified 0015  Method of Notification Page  Notification Reason Change in status  Provider response See new orders  Date of Provider Response 10/22/23  Time of Provider Response 0015  Assess: SIRS CRITERIA  SIRS Temperature  1  SIRS Respirations  0  SIRS Pulse 1  SIRS WBC 0  SIRS Score Sum  2

## 2023-10-22 NOTE — Progress Notes (Signed)
 Patient had large bowel movement after administration of SMOG enema.

## 2023-10-22 NOTE — Progress Notes (Signed)
   10/21/23 2323  Assess: MEWS Score  Temp (!) 100.8 F (38.2 C)  BP 132/80  MAP (mmHg) 96  Pulse Rate (!) 116  ECG Heart Rate (!) 116  Resp 19  Level of Consciousness Alert  SpO2 99 %  O2 Device Room Air  Assess: MEWS Score  MEWS Temp 1  MEWS Systolic 0  MEWS Pulse 2  MEWS RR 0  MEWS LOC 0  MEWS Score 3  MEWS Score Color Yellow  Assess: if the MEWS score is Yellow or Red  Were vital signs accurate and taken at a resting state? Yes  Does the patient meet 2 or more of the SIRS criteria? Yes  Does the patient have a confirmed or suspected source of infection? Yes  MEWS guidelines implemented  Yes, yellow  Treat  MEWS Interventions Considered administering scheduled or prn medications/treatments as ordered  Take Vital Signs  Increase Vital Sign Frequency  Yellow: Q2hr x1, continue Q4hrs until patient remains green for 12hrs  Escalate  MEWS: Escalate Yellow: Discuss with charge nurse and consider notifying provider and/or RRT  Notify: Charge Nurse/RN  Name of Charge Nurse/RN Notified Fairy, RN  Provider Notification  Provider Name/Title Franky, MD  Date Provider Notified 10/22/23  Time Provider Notified 0030  Method of Notification Page  Notification Reason Change in status  Provider response No new orders  Assess: SIRS CRITERIA  SIRS Temperature  0  SIRS Respirations  0  SIRS Pulse 1  SIRS WBC 0  SIRS Score Sum  1

## 2023-10-22 NOTE — Progress Notes (Signed)
 Lactic Acid resulted at 3.2, attempted to notify Franky, MD.   Lonell LITTIE Lyme, RN

## 2023-10-22 NOTE — Consult Note (Signed)
 [facility]  Total days of antibiotics 6       Reason for Consult:fever and concern for ischial osteomyelitis    Referring Physician: nettey  Principal Problem:   Stercoral colitis    HPI: [name] is a 53 y.o. female with history of MS and cirrhosis, cognitive impairment, who was initially admitted on 8/9 or fever, abdominal discomfort and dysuria. Labs whosed WBC ov 7.4, and LA 1.9, Imaging was significant for stercoral colitis and mild right sided hydroureternephrosis due to compression from the rectum. She was started on ceftriaxone  plus metronidazole  in addition to bowel regimen to relieve constipation. Last night had episode of high fever of 102.88F with associated tachycardia. Thus infectious work up repeated and also had repeat abd CT - in addition to abtx expanded now to vancomycin  plus piptazo. CT commented on possible ischial osteomyelitis, since she is known to have chronic buttock wound. ID asked to weigh in whether it needs further treatment. It is not likely the cause of her SIRS episode last night. General surgery evaluated the ischial wound and felt it was clean no evidence of purulence, but has palpable bone. In terms of stercoral colitis, they recommended increasing doses of miralax  and dulcolax suppository to continue to debulk stool burden. GI agreed that she still has ways to go for fecal disimpaction.   In reviewing old records - she has hx of ischial osteomyelitis in 12/2020.   Past Medical History:  Diagnosis Date   Arthritis    MS (multiple sclerosis) (HCC)     Allergies:  Allergies  Allergen Reactions   Tomato Rash and Other (See Comments)    Tongue turns white and cannot taste for at least 2 days.    Tizanidine Diarrhea    Current antibiotics:   MEDICATIONS:  baclofen   20 mg Oral TID   bisacodyl   10 mg Rectal Daily   gabapentin   600 mg Oral TID   liver oil-zinc  oxide   Topical BID   pantoprazole   40 mg Oral Daily    polyethylene glycol  17 g Oral BID   senna-docusate  2 tablet Oral BID   SMOG  400 mL Rectal TID    Social History   Tobacco Use   Smoking status: Former    Current packs/day: 0.00    Average packs/day: 2.0 packs/day for 20.0 years (40.0 ttl pk-yrs)    Types: Cigarettes    Start date: 07/03/1988    Quit date: 07/03/2008    Years since quitting: 15.3   Smokeless tobacco: Former    Quit date: 01/10/2019   Tobacco comments:    USES ELECTRONIC CIGARETTES/ quit 01/2019  Substance Use Topics   Alcohol use: No   Drug use: No    History reviewed. No pertinent family history.  Review of Systems -  Poor historian, reports mild abdominal pain.  OBJECTIVE: Temp:  [98.5 F (36.9 C)-102.9 F (39.4 C)] 98.5 F (36.9 C) (08/13 1612) Pulse Rate:  [101-127] 101 (08/13 1612) Resp:  [14-20] 20 (08/13 1612) BP: (82-160)/(49-80) 108/78 (08/13 1612) SpO2:  [94 %-100 %] 97 % (08/13 1612) Weight:  [67.6 kg] 67.6 kg (08/13 0441) Physical Exam  Constitutional:  oriented to person, place, and time. appears well-developed and well-nourished. No distress.  HENT: [place]/AT, PERRLA, no scleral icterus Mouth/Throat: Oropharynx is clear and moist. No oropharyngeal exudate.  Cardiovascular: Normal rate, regular rhythm and normal heart sounds. Exam reveals no gallop and no friction rub.  No murmur heard.  Pulmonary/Chest: Effort normal and breath sounds normal. No respiratory distress.  has no wheezes.  Neck = supple, no nuchal rigidity Abdominal: Soft. Bowel sounds are normal.  exhibits no distension. There is no tenderness.  Lymphadenopathy: no cervical adenopathy. No axillary adenopathy  Media Information  Document Information  Photos    10/18/2023 18:11  Attached To:  Hospital Encounter on 10/17/23  Source Information  Myrna Lauraine FALCON, RN  [facility]     LABS: Results for orders placed or performed during the hospital encounter of 10/17/23 (from the past 48 hours)  Vitamin B12      Status: None   Collection Time: 10/20/23  8:03 PM  Result Value Ref Range   Vitamin B-12 620 180 - 914 pg/mL    Comment: (NOTE) This assay is not validated for testing neonatal or myeloproliferative syndrome specimens for Vitamin B12 levels. Performed at [facility], [address], [place], [place] [address]   Iron  and TIBC     Status: Abnormal   Collection Time: 10/20/23  8:03 PM  Result Value Ref Range   Iron  11 (L) 28 - 170 ug/dL   TIBC 618 749 - 549 ug/dL   Saturation Ratios 3 (L) 10.4 - 31.8 %   UIBC 370 ug/dL    Comment: Performed at [facility], [address], [place], [place] [address]  Ferritin     Status: Abnormal   Collection Time: 10/20/23  8:03 PM  Result Value Ref Range   Ferritin 6 (L) 11 - 307 ng/mL    Comment: Performed at [facility], [address], [place], [place] [address]  Basic metabolic panel with GFR     Status: Abnormal   Collection Time: 10/21/23  3:48 AM  Result Value Ref Range   Sodium 139 135 - 145 mmol/L   Potassium 3.7 3.5 - 5.1 mmol/L   Chloride 107 98 - 111 mmol/L   CO2 23 22 - 32 mmol/L   Glucose, Bld 98 70 - 99 mg/dL    Comment: Glucose reference range applies only to samples taken after fasting for at least 8 hours.   BUN 11 6 - 20 mg/dL   Creatinine, Ser 9.32 0.44 - 1.00 mg/dL   Calcium  8.2 (L) 8.9 - 10.3 mg/dL   GFR, Estimated >39 >39 mL/min    Comment: (NOTE) Calculated using the CKD-EPI Creatinine Equation (2021)    Anion gap 9 5 - 15    Comment: Performed at [facility], [address], [place], [place] [address]  Lactic acid, plasma     Status: Abnormal   Collection Time: 10/22/23  2:38 AM  Result Value Ref Range   Lactic Acid, Venous 3.2 (HH) 0.5 - 1.9 mmol/L    Comment: CRITICAL RESULT CALLED TO, READ BACK BY AND VERIFIED WITH MANCIL BIRCH, RN 0422 10/22/2023 SANDOVAL K Performed at [facility], [address], [place], [place] [address]   CBC with Differential/Platelet     Status:  Abnormal   Collection Time: 10/22/23  2:38 AM  Result Value Ref Range   WBC 4.9 4.0 - 10.5 K/uL   RBC 3.88 3.87 - 5.11 MIL/uL   Hemoglobin 8.6 (L) 12.0 - 15.0 g/dL   HCT 69.2 (L) 63.9 - 53.9 %   MCV 79.1 (L) 80.0 - 100.0 fL   MCH 22.2 (L) 26.0 - 34.0 pg   MCHC 28.0 (L) 30.0 - 36.0 g/dL   RDW 82.3 (H) 88.4 -  15.5 %   Platelets 146 (L) 150 - 400 K/uL   nRBC 0.0 0.0 - 0.2 %   Neutrophils Relative % 81 %   Neutro Abs 4.0 1.7 - 7.7 K/uL   Lymphocytes Relative 10 %   Lymphs Abs 0.5 (L) 0.7 - 4.0 K/uL   Monocytes Relative 8 %   Monocytes Absolute 0.4 0.1 - 1.0 K/uL   Eosinophils Relative 0 %   Eosinophils Absolute 0.0 0.0 - 0.5 K/uL   Basophils Relative 0 %   Basophils Absolute 0.0 0.0 - 0.1 K/uL   Immature Granulocytes 1 %   Abs Immature Granulocytes 0.03 0.00 - 0.07 K/uL    Comment: Performed at [facility], [address], [place], [place] [address]  Lactic acid, plasma     Status: Abnormal   Collection Time: 10/22/23  5:33 AM  Result Value Ref Range   Lactic Acid, Venous 3.0 (HH) 0.5 - 1.9 mmol/L    Comment: CRITICAL VALUE NOTED. VALUE IS CONSISTENT WITH PREVIOUSLY REPORTED/CALLED VALUE Performed at [facility], [address], [place], [place] [address]   Lactic acid, plasma     Status: Abnormal   Collection Time: 10/22/23  2:07 PM  Result Value Ref Range   Lactic Acid, Venous 2.5 (HH) 0.5 - 1.9 mmol/L    Comment: CRITICAL VALUE NOTED. VALUE IS CONSISTENT WITH PREVIOUSLY REPORTED/CALLED VALUE Performed at [facility], [address] [place], [place] [address]     MICRO: reviewed IMAGING: CT ABDOMEN PELVIS WO CONTRAST Result Date: 10/22/2023 EXAM: CT ABDOMEN AND PELVIS WITHOUT CONTRAST 10/22/2023 05:14:02 AM TECHNIQUE: CT of the abdomen and pelvis was performed without the administration of intravenous contrast. Multiplanar reformatted images are provided for review. Automated exposure control, iterative reconstruction, and/or weight based adjustment  of the mA/kV was utilized to reduce the radiation dose to as low as reasonably achievable. COMPARISON: 10/17/2023 CLINICAL HISTORY: Abdominal pain, acute, nonlocalized. cirrhosis on admission imaging p/w stercoral colitis c/b R hydroureteronephrosis and acute urinary retention. FINDINGS: LOWER CHEST: No acute abnormality. LIVER: The contour of the liver is irregular and slightly nodular. Hypertrophy of the lateral segment of the left lobe of the liver is noted. No focal liver lesion identified. GALLBLADDER AND BILE DUCTS: Gallbladder sludge versus noncalcified stones noted within the dependent portion of the gallbladder. No significant gallbladder wall thickening or pericholecystic inflammation. No signs of main duct dilatation, inflammation or mass. SPLEEN: No focal splenic abnormality. PANCREAS: No acute abnormality. ADRENAL GLANDS: The adrenal glands are normal. KIDNEYS, URETERS AND BLADDER: Bilateral kidney stones. The right kidney stone measures 3 mm within the upper pole collecting system. Multiple left kidney stones are noted. The largest is in the inferior pole measuring 6 mm. Unchanged cyst within the anterior cortex of the interpolar right kidney measuring 2.4 cm. No follow up imaging recommended. Right-sided hydronephrosis has resolved in the interval. No left hydronephrosis. Urinary bladder appears normal. GI AND BOWEL: The stomach is non-distended. No pathologic dilatation of the large or small bowel loops. There are a few prominent small bowel loops with air-fluid levels noted in the right lower quadrant of the abdomen which measure up to 2.2 cm. Persistent moderate stool burden noted throughout the colon. The appendix is visualized and appears normal. A large volume of desiccated stool is identified within the rectum which is only mildly diminished when compared with the previous exam. Persistent circumferential wall thickening of the rectum with mild perirectal soft tissue haziness. PERITONEUM AND  RETROPERITONEUM: No  free fluid or fluid collections. No signs of pneumoperitoneum. VASCULATURE: Aortic atherosclerosis. Upper abdominal varicosities. LYMPH NODES: No abdominal or pelvic adenopathy. REPRODUCTIVE ORGANS: No acute abnormality. BONES AND SOFT TISSUES: Bilateral areas of ischial decubitus ulcerations are present, which extend up to the bone. This is similar to the previous exam. Asymmetric increased sclerosis involving the right ischial tuberosity is unchanged (image 114/3). Imaging findings most likely reflect sequelae of chronic osteomyelitis. There is loss of the cortex overlying the left ischial tuberosity, which is concerning for acute osteomyelitis (sagittal image 107/7). No associated fluid collections identified. IMPRESSION: 1. Persistent circumferential wall thickening of the rectum with mild perirectal soft tissue haziness, concerning for stercoral colitis. 2. Large volume of retained stool throughout the colon compatible with constipation. 3. Bilateral areas of ischial decubitus ulcerations, similar to the previous exam Loss of the cortex overlying the left ischial tuberosity, concerning for acute osteomyelitis. No associated fluid collections identified. 4. Bilateral kidney stones. Right-sided hydronephrosis has resolved in the interval. 5. Morphologic features of the liver compatible with cirrhosis. Stigmata of portal venous hypertension noted, including upper abdominal varicosities. Electronically signed by: Waddell Calk MD 10/22/2023 06:07 AM EDT RP Workstation: [id number]   DG Abd 1 View Result Date: 10/22/2023 CLINICAL DATA:  Abdominal pain EXAM: ABDOMEN - 1 VIEW COMPARISON:  12/22/2020 FINDINGS: Scattered large and small bowel gas is noted. Fecal material is noted throughout the colon consistent with colonic constipation. Considerable fecal material is noted within the rectum consistent with impaction. No free air is seen. No bony abnormality is noted. IMPRESSION: Changes consistent  with colonic constipation and rectal impaction. Electronically Signed   By: Oneil Devonshire M.D.   On: 10/22/2023 03:08    HISTORICAL MICRO/IMAGING  Assessment/Plan:   Chronic ischial wound and chronic osteomyelitis = continue with local wound care. She is currently on iv abtx for colitis - at day 6. I would like de-escalate back to amp/sub for the timebeing. Can do short course of abtx  instead of committing to 6 wk of iv abtx, not useful to retreat for acute osteomyelitis given findings similar to 2022 imaging  Continue with off loading and rotation to minimize worsening pressure ulcer  Will follow up on blood cx to see if abtx need further change  Stercoral colitis = continue with bowel regimen with hopes to improve fecal impaction.

## 2023-10-23 ENCOUNTER — Inpatient Hospital Stay (HOSPITAL_COMMUNITY)

## 2023-10-23 DIAGNOSIS — I809 Phlebitis and thrombophlebitis of unspecified site: Secondary | ICD-10-CM

## 2023-10-23 DIAGNOSIS — L89300 Pressure ulcer of unspecified buttock, unstageable: Secondary | ICD-10-CM

## 2023-10-23 DIAGNOSIS — A419 Sepsis, unspecified organism: Secondary | ICD-10-CM | POA: Diagnosis not present

## 2023-10-23 DIAGNOSIS — L309 Dermatitis, unspecified: Secondary | ICD-10-CM

## 2023-10-23 DIAGNOSIS — R6520 Severe sepsis without septic shock: Secondary | ICD-10-CM | POA: Diagnosis not present

## 2023-10-23 DIAGNOSIS — M86659 Other chronic osteomyelitis, unspecified thigh: Secondary | ICD-10-CM

## 2023-10-23 DIAGNOSIS — K5289 Other specified noninfective gastroenteritis and colitis: Secondary | ICD-10-CM | POA: Diagnosis not present

## 2023-10-23 LAB — CBC WITH DIFFERENTIAL/PLATELET
Abs Immature Granulocytes: 0.03 K/uL (ref 0.00–0.07)
Basophils Absolute: 0 K/uL (ref 0.0–0.1)
Basophils Relative: 1 %
Eosinophils Absolute: 0.1 K/uL (ref 0.0–0.5)
Eosinophils Relative: 2 %
HCT: 30.8 % — ABNORMAL LOW (ref 36.0–46.0)
Hemoglobin: 8.5 g/dL — ABNORMAL LOW (ref 12.0–15.0)
Immature Granulocytes: 1 %
Lymphocytes Relative: 30 %
Lymphs Abs: 2 K/uL (ref 0.7–4.0)
MCH: 22.3 pg — ABNORMAL LOW (ref 26.0–34.0)
MCHC: 27.6 g/dL — ABNORMAL LOW (ref 30.0–36.0)
MCV: 80.6 fL (ref 80.0–100.0)
Monocytes Absolute: 0.9 K/uL (ref 0.1–1.0)
Monocytes Relative: 13 %
Neutro Abs: 3.6 K/uL (ref 1.7–7.7)
Neutrophils Relative %: 53 %
Platelets: 167 K/uL (ref 150–400)
RBC: 3.82 MIL/uL — ABNORMAL LOW (ref 3.87–5.11)
RDW: 18.4 % — ABNORMAL HIGH (ref 11.5–15.5)
WBC: 6.6 K/uL (ref 4.0–10.5)
nRBC: 0 % (ref 0.0–0.2)

## 2023-10-23 LAB — CULTURE, BLOOD (ROUTINE X 2)
Culture: NO GROWTH
Special Requests: ADEQUATE

## 2023-10-23 LAB — BASIC METABOLIC PANEL WITH GFR
Anion gap: 8 (ref 5–15)
BUN: 6 mg/dL (ref 6–20)
CO2: 23 mmol/L (ref 22–32)
Calcium: 8 mg/dL — ABNORMAL LOW (ref 8.9–10.3)
Chloride: 105 mmol/L (ref 98–111)
Creatinine, Ser: 0.59 mg/dL (ref 0.44–1.00)
GFR, Estimated: >60 mL/min (ref >60)
Glucose, Bld: 101 mg/dL — ABNORMAL HIGH (ref 70–99)
Potassium: 3.4 mmol/L — ABNORMAL LOW (ref 3.5–5.1)
Sodium: 136 mmol/L (ref 135–145)

## 2023-10-23 MED ORDER — POLYETHYLENE GLYCOL 3350 17 GM/SCOOP PO POWD
238.0000 g | Freq: Once | ORAL | Status: AC
  Administered 2023-10-23: 238 g via ORAL
  Filled 2023-10-23: qty 238

## 2023-10-23 MED ORDER — SODIUM CHLORIDE 0.9% FLUSH: 10.0000 mL | INTRAVENOUS | Status: DC | PRN

## 2023-10-23 MED ORDER — SODIUM CHLORIDE 0.9% FLUSH
10.0000 mL | Freq: Two times a day (BID) | INTRAVENOUS | Status: DC
  Administered 2023-10-23 – 2023-10-27 (×8): 10 mL

## 2023-10-23 MED ORDER — GERHARDT'S BUTT CREAM
TOPICAL_CREAM | Freq: Two times a day (BID) | CUTANEOUS | Status: DC
  Filled 2023-10-23 (×2): qty 60

## 2023-10-23 MED ORDER — PANTOPRAZOLE SODIUM 40 MG IV SOLR
40.0000 mg | Freq: Every day | INTRAVENOUS | Status: DC
  Administered 2023-10-23 – 2023-10-27 (×5): 40 mg via INTRAVENOUS
  Filled 2023-10-23 (×5): qty 10

## 2023-10-23 MED ORDER — SMOG ENEMA
400.0000 mL | RECTAL | Status: AC
  Administered 2023-10-23: 400 mL via RECTAL
  Filled 2023-10-23: qty 960

## 2023-10-23 MED ORDER — SODIUM CHLORIDE 0.9 % IV BOLUS: 1000.0000 mL | Freq: Once | INTRAVENOUS | Status: DC

## 2023-10-23 MED ORDER — POTASSIUM CHLORIDE 20 MEQ PO PACK
40.0000 meq | PACK | Freq: Once | ORAL | Status: AC
  Administered 2023-10-23: 40 meq via ORAL
  Filled 2023-10-23: qty 2

## 2023-10-23 MED ORDER — LACTATED RINGERS IV SOLN: INTRAVENOUS | Status: AC

## 2023-10-23 MED ORDER — SODIUM CHLORIDE 0.9 % IV SOLN
3.0000 g | Freq: Four times a day (QID) | INTRAVENOUS | Status: DC
  Administered 2023-10-23: 3 g via INTRAVENOUS
  Filled 2023-10-23: qty 8

## 2023-10-23 MED ORDER — SODIUM CHLORIDE 0.9 % IV SOLN
2.0000 g | INTRAVENOUS | Status: DC
  Administered 2023-10-23 – 2023-10-26 (×4): 2 g via INTRAVENOUS
  Filled 2023-10-23 (×4): qty 20

## 2023-10-23 MED ORDER — SMOG ENEMA
400.0000 mL | Freq: Once | RECTAL | Status: AC
  Administered 2023-10-23: 400 mL via RECTAL
  Filled 2023-10-23: qty 960

## 2023-10-23 MED ORDER — METRONIDAZOLE 500 MG PO TABS
500.0000 mg | ORAL_TABLET | Freq: Two times a day (BID) | ORAL | Status: DC
  Administered 2023-10-23 – 2023-10-27 (×9): 500 mg via ORAL
  Filled 2023-10-23 (×9): qty 1

## 2023-10-23 NOTE — Plan of Care (Signed)

## 2023-10-23 NOTE — Progress Notes (Signed)
 Progress Note: General Surgery Service   Chief Complaint/Subjective: Bowel movements, lots of bowel movements.  No belly pain.  Objective: Vital signs in last 24 hours: Temp:  [97.6 F (36.4 C)-99.2 F (37.3 C)] 99.2 F (37.3 C) (08/14 0723) Pulse Rate:  [90-113] 90 (08/14 0723) Resp:  [14-20] 16 (08/14 0723) BP: (82-118)/(49-78) 89/58 (08/14 0723) SpO2:  [95 %-100 %] 97 % (08/14 0411) Weight:  [63.5 kg] 63.5 kg (08/14 0355) Last BM Date : 10/22/23  Intake/Output from previous day: 08/13 0701 - 08/14 0700 In: 3317.8 [I.V.:1662.6; IV Piggyback:1655.2] Out: 1250 [Urine:1250] Intake/Output this shift: No intake/output data recorded.  GI: Abd Soft, nontender  Lab Results: CBC  Recent Labs    10/22/23 0238 10/23/23 0332  WBC 4.9 6.6  HGB 8.6* 8.5*  HCT 30.7* 30.8*  PLT 146* 167   BMET Recent Labs    10/21/23 0348 10/23/23 0332  NA 139 136  K 3.7 3.4*  CL 107 105  CO2 23 23  GLUCOSE 98 101*  BUN 11 6  CREATININE 0.67 0.59  CALCIUM  8.2* 8.0*   PT/INR No results for input(s): LABPROT, INR in the last 72 hours. ABG No results for input(s): PHART, HCO3 in the last 72 hours.  Invalid input(s): PCO2, PO2  Anti-infectives: Anti-infectives (From admission, onward)    Start     Dose/Rate Route Frequency Ordered Stop   10/23/23 0900  Ampicillin -Sulbactam (UNASYN ) 3 g in sodium chloride  0.9 % 100 mL IVPB        3 g 200 mL/hr over 30 Minutes Intravenous Every 6 hours 10/23/23 0809     10/22/23 2000  vancomycin  (VANCOREADY) IVPB 750 mg/150 mL  Status:  Discontinued        750 mg 150 mL/hr over 60 Minutes Intravenous Every 12 hours 10/22/23 0636 10/23/23 0809   10/22/23 0800  piperacillin -tazobactam (ZOSYN ) IVPB 3.375 g  Status:  Discontinued        3.375 g 12.5 mL/hr over 240 Minutes Intravenous Every 8 hours 10/22/23 0636 10/23/23 0809   10/22/23 0730  vancomycin  (VANCOREADY) IVPB 1500 mg/300 mL        1,500 mg 150 mL/hr over 120 Minutes Intravenous   Once 10/22/23 0636 10/22/23 1111   10/20/23 2000  ciprofloxacin  (CIPRO ) tablet 500 mg  Status:  Discontinued        500 mg Oral 2 times daily 10/20/23 1513 10/22/23 0636   10/19/23 1845  ampicillin -sulbactam (UNASYN ) 1.5 g in sodium chloride  0.9 % 100 mL IVPB  Status:  Discontinued        1.5 g 200 mL/hr over 30 Minutes Intravenous Every 6 hours 10/19/23 1746 10/22/23 0621   10/18/23 1000  cefTRIAXone  (ROCEPHIN ) 2 g in sodium chloride  0.9 % 100 mL IVPB  Status:  Discontinued        2 g 200 mL/hr over 30 Minutes Intravenous Every 24 hours 10/18/23 0139 10/19/23 1746   10/18/23 0145  metroNIDAZOLE  (FLAGYL ) IVPB 500 mg  Status:  Discontinued        500 mg 100 mL/hr over 60 Minutes Intravenous 2 times daily 10/18/23 0139 10/22/23 0636   10/17/23 2345  cefTRIAXone  (ROCEPHIN ) 1 g in sodium chloride  0.9 % 100 mL IVPB        1 g 200 mL/hr over 30 Minutes Intravenous  Once 10/17/23 2343 10/18/23 0045       Medications: Scheduled Meds:  baclofen   20 mg Oral TID   bisacodyl   10 mg Rectal Daily   gabapentin   600 mg Oral TID   liver oil-zinc  oxide   Topical BID   pantoprazole   40 mg Oral Daily   polyethylene glycol  17 g Oral BID   senna-docusate  2 tablet Oral BID   Continuous Infusions:  ampicillin -sulbactam (UNASYN ) IV 3 g (10/23/23 0919)   lactated ringers  100 mL/hr at 10/23/23 0533   PRN Meds:.acetaminophen , melatonin, naLOXone  (NARCAN )  injection, ondansetron  (ZOFRAN ) IV, traMADol   Assessment/Plan: [name] is a 53 year old female with a chronic pressure ulcer and stercoral colitis.  Responding well to bowel regimen, no evidence for perforation Wound is chronic with no major changes or signs of worsening infection in need of intervention  Surgery team will sign off, please call us  back if needed   LOS: 5 days   Deward JINNY Foy, MD  [facility] Use [email or web address] to contact on call provider  Daily Billing: [phone] - Straightforward / Low MDM

## 2023-10-23 NOTE — Progress Notes (Signed)
 Pt has had 4 large BM from 1900-0700, wound dressings changed accordingly.  Lonell LITTIE Lyme, RN

## 2023-10-23 NOTE — Progress Notes (Signed)

## 2023-10-23 NOTE — Progress Notes (Signed)
 [facility]  Date of Admission:  10/17/2023      Total days of antibiotics 7           ASSESSMENT: [name] is a 53 y.o. female with:   Fever - Chronic Ischial Osteomyleitis -  Colitis -  Radiography w/o progression compared to 2022. Surgery evaluated wound and clean w/o need for intervention.  No positive blood cultures   Stercoral colitis - Bowel regimen per primary/gen surg with hopes to improve fecal impaction. Imaging 8/13 w/o evidence of abscess.  Back to ceftriaxone  + metronidazole  for now. Suspect she can complete today for 7d course  Irritant Dermatitis -  Switch Dr. Collie butt balm   Phlebitis -  Erythematous R PIV with Warmth. D/W her care team. Recommend removing and place Midline.  Will change abx to midline friendly option.  ?Liver Cirrhosis -  Upper abd varices noted on CT study and portal HTN.  Check Hep B/C but suspect likely NASH  MS -    PLAN: Ceftriaxone  + PO Metronidazole   Midline OK for access Please remove Rt PIV for phlebitis concern  Dr. Collie butt balm   Principal Problem:   Stercoral colitis    baclofen   20 mg Oral TID   bisacodyl   10 mg Rectal Daily   gabapentin   600 mg Oral TID   Gerhardt's butt cream   Topical BID   metroNIDAZOLE   500 mg Oral Q12H   pantoprazole  (PROTONIX ) IV  40 mg Intravenous Daily   polyethylene glycol powder  238 g Oral Once   potassium chloride   40 mEq Oral Once   senna-docusate  2 tablet Oral BID    SUBJECTIVE: She has a lot of pain due to skin irritation from soiling from stools.   Review of Systems: Review of Systems  Constitutional:  Negative for chills, fever and weight loss.  Respiratory:  Negative for cough.   Cardiovascular:  Negative for chest pain.  Gastrointestinal:  Positive for diarrhea. Negative for abdominal pain, nausea and vomiting.  Skin:  Positive for rash.  Neurological:  Negative for headaches.    Allergies  Allergen  Reactions   Tomato Rash and Other (See Comments)    Tongue turns white and cannot taste for at least 2 days.    Tizanidine Diarrhea    OBJECTIVE: Vitals:   10/23/23 0355 10/23/23 0411 10/23/23 0723 10/23/23 1102  BP: (!) 96/51 (!) 88/56 (!) 89/58 (!) 96/55  Pulse: (!) 105 96 90 98  Resp: 18 18 16 17   Temp: 97.6 F (36.4 C)  99.2 F (37.3 C) 98.6 F (37 C)  TempSrc: Oral  Axillary Oral  SpO2: 97% 97%    Weight: 63.5 kg     Height:       Body mass index is 25.6 kg/m.  Physical Exam Constitutional:      Appearance: Normal appearance. She is not ill-appearing.  HENT:     Mouth/Throat:     Mouth: Mucous membranes are moist.     Pharynx: Oropharynx is clear.  Eyes:     General: No scleral icterus. Cardiovascular:     Rate and Rhythm: Normal rate and regular rhythm.  Pulmonary:     Effort: Pulmonary effort is normal.  Musculoskeletal:        General: No swelling or tenderness.  Skin:    General: Skin is warm.     Capillary Refill: Capillary refill takes less than 2 seconds.  Findings: Erythema and rash present.  Neurological:     Mental Status: She is oriented to person, place, and time.  Psychiatric:        Mood and Affect: Mood normal.        Thought Content: Thought content normal.     Right arm with linear erythema and warmth up from PIV site. She does not claim it is tender but is very fatigued with the possibility of having another stick for another IV.    Lab Results Lab Results  Component Value Date   WBC 6.6 10/23/2023   HGB 8.5 (L) 10/23/2023   HCT 30.8 (L) 10/23/2023   MCV 80.6 10/23/2023   PLT 167 10/23/2023    Lab Results  Component Value Date   CREATININE 0.59 10/23/2023   BUN 6 10/23/2023   NA 136 10/23/2023   K 3.4 (L) 10/23/2023   CL 105 10/23/2023   CO2 23 10/23/2023    Lab Results  Component Value Date   ALT 9 10/18/2023   AST 27 10/18/2023   ALKPHOS 80 10/18/2023   BILITOT 0.3 10/18/2023     Microbiology: Recent Results  (from the past 240 hours)  Blood Culture (routine x 2)     Status: None   Collection Time: 10/17/23 10:10 PM   Specimen: BLOOD RIGHT ARM  Result Value Ref Range Status   Specimen Description BLOOD RIGHT ARM  Final   Special Requests   Final    BOTTLES DRAWN AEROBIC AND ANAEROBIC Blood Culture adequate volume   Culture   Final    NO GROWTH 5 DAYS Performed at [facility], [address], [place], [place] [address]    Report Status 10/22/2023 FINAL  Final  Blood Culture (routine x 2)     Status: None   Collection Time: 10/17/23 10:35 PM   Specimen: BLOOD RIGHT ARM  Result Value Ref Range Status   Specimen Description BLOOD RIGHT ARM  Final   Special Requests   Final    BOTTLES DRAWN AEROBIC AND ANAEROBIC Blood Culture adequate volume   Culture   Final    NO GROWTH 5 DAYS Performed at [facility], [address] [place], [place] [address]    Report Status 10/23/2023 FINAL  Final  Urine Culture     Status: Abnormal   Collection Time: 10/18/23 11:10 AM   Specimen: Urine, Random  Result Value Ref Range Status   Specimen Description URINE, RANDOM  Final   Special Requests   Final    NONE Reflexed from [id number] Performed at [facility], [address] [place], [place] [address]    Culture (A)  Final    >=100,000 COLONIES/mL ENTEROCOCCUS FAECIUM 30,000 COLONIES/mL PSEUDOMONAS AERUGINOSA    Report Status 10/20/2023 FINAL  Final   Organism ID, Bacteria ENTEROCOCCUS FAECIUM (A)  Final   Organism ID, Bacteria PSEUDOMONAS AERUGINOSA (A)  Final      Susceptibility   Enterococcus faecium - MIC*    AMPICILLIN  <=2 SENSITIVE Sensitive     NITROFURANTOIN 256 RESISTANT Resistant     VANCOMYCIN  <=0.5 SENSITIVE Sensitive     * >=100,000 COLONIES/mL ENTEROCOCCUS FAECIUM   Pseudomonas aeruginosa - MIC*    CEFTAZIDIME 4 SENSITIVE Sensitive     CIPROFLOXACIN  <=0.25 SENSITIVE Sensitive     GENTAMICIN 4 SENSITIVE Sensitive     IMIPENEM 1 SENSITIVE Sensitive     PIP/TAZO  <=4 SENSITIVE Sensitive ug/mL    CEFEPIME  2 SENSITIVE Sensitive     *  30,000 COLONIES/mL PSEUDOMONAS AERUGINOSA  MRSA Next Gen by PCR, Nasal     Status: None   Collection Time: 10/18/23  8:22 PM   Specimen: Nasal Mucosa; Nasal Swab  Result Value Ref Range Status   MRSA by PCR Next Gen NOT DETECTED NOT DETECTED Final    Comment: (NOTE) The GeneXpert MRSA Assay (FDA approved for NASAL specimens only), is one component of a comprehensive MRSA colonization surveillance program. It is not intended to diagnose MRSA infection nor to guide or monitor treatment for MRSA infections. Test performance is not FDA approved in patients less than 70 years old. Performed at [facility], [address], [place], [place] [address]   Culture, blood (Routine X 2) w Reflex to ID Panel     Status: None (Preliminary result)   Collection Time: 10/22/23  8:32 AM   Specimen: BLOOD LEFT ARM  Result Value Ref Range Status   Specimen Description BLOOD LEFT ARM  Final   Special Requests   Final    BOTTLES DRAWN AEROBIC AND ANAEROBIC Blood Culture adequate volume   Culture   Final    NO GROWTH < 24 HOURS Performed at [facility], [address], [place], [place] [address]    Report Status PENDING  Incomplete  Culture, blood (Routine X 2) w Reflex to ID Panel     Status: None (Preliminary result)   Collection Time: 10/22/23  8:32 AM   Specimen: BLOOD LEFT ARM  Result Value Ref Range Status   Specimen Description BLOOD LEFT ARM  Final   Special Requests   Final    BOTTLES DRAWN AEROBIC AND ANAEROBIC Blood Culture adequate volume   Culture   Final    NO GROWTH < 24 HOURS Performed at [facility], [address] [place], [place] [address]    Report Status PENDING  Incomplete    Corean Fireman, MSN, NP-C [facility]  [email or web address] Pager: [phone] Office: [phone] [facility] Main Line: [phone] *Secure Chat  Communication Welcome  Total Encounter Time: 15 m

## 2023-10-23 NOTE — Progress Notes (Signed)
 PROGRESS NOTE    [name]  FMW:[id number] DOB: 04/19/1970 DOA: 10/17/2023 PCP: [facility]   Brief Narrative: [name] is a 53 y.o. female with a history of multiple sclerosis, cirrhosis.  Patient presented secondary to fever and was found to have evidence of sterile coral colitis in addition to right hydroureteronephrosis and acute urinary retentio; urine studies suggestive of a urinary tract infection.  Patient started on antibiotics but developed severe sepsis physiology and repeat imaging concerning for possible osteomyelitis in addition to persistent stercoral colitis.   Assessment and Plan:  Stercoral colitis Present on admission and presumed secondary to severe constipation. Diagnosed via CT imaging. Patient started on empiric antibiotics. General surgery consulted and evaluated, in setting of sepsis, with no evidence of peritonitis and no need for surgical management. GI consulted and started SMOG enema regimen with great success. -GI recommendations: pending today -MiraLAX   Severe sepsis Not present on admission. Possibly secondary to stercoral colitis, although there is possible ischial osteomyelitis noted on CT imaging. Hypotension and lactic acidosis present. Repeat blood cultures obtained (8/13) and antibiotics broadened to Vancomycin  and Zosyn  IV. Abdomen not consistent with peritonitis. Hypotension responding to IV fluid boluses. Vancomycin  and Zosyn  transitioned back to Unasyn  per ID -Continue antibiotics -Follow-up blood cultures -Trend BPs -Continue IV fluids  Possible ischial osteomyelitis Noted on CT imaging (8/13). On empiric antibiotics for sepsis. Patient with a known and chronic (over 2 years) buttock wound and is followed by wound care as an outpatient. Patient started empirically on vancomycin  and Zosyn  for sepsis physiology. ID consulted and will unlikely commit to treating for several weeks as acute infection -Consult ID for  recommendations: Ceftriaxone /Flagyl   IV site rash Possibly medication related, but unclear. No symptoms. -Watch clinically for worsening  Constipation Persistent constipation noted on CT imaging. Per nursing, patient has had daily bowel movements. Patient with significant bowel movements after SMOG enemas. -MiraLAX  BID -Senokot-S BID  Right hydronephrosis, resolved Noted on CT imaging. No associated AKI. Foley catheter placed. Urology consulted with recommendation for follow-up renal ultrasound, which was performed on 8/11 and negative for hydronephrosis.  UTI, resolved Urine culture significant for Enterococcus Faecium and Pseudomonas Aeruginosa. Patient completed treatment with Unasyn  and Ciprofloxacin .  Cirrhosis Portal hypertension Noted. Patient will need outpatient GI follow-up.  Hypokalemia -Potassium supplementation as needed  Pill dysphagia Noted. Chronic issue.  Microcytic anemia Baseline hemoglobin of around 10-11. Hemoglobin of 9.1 on admission and has drifted down slightly. Patient's anemia panel suggests iron  deficiency. IV iron  ordered while inpatient.  Pressure injury Right/upper buttocks. Present on admission.   DVT prophylaxis: SCDs Code Status:   Code Status: Full Code Family Communication: None at bedside Disposition Plan: Discharge likely to SNF pending continued medical workup/management of sepsis   Consultants:  Urology General surgery Gastroenterology Infectious disease  Procedures:  None  Antimicrobials: Ceftriaxone  Flagyl  Unasyn  Ciprofloxacin  Vancomycin  Zosyn     Subjective: No issues overnight. Some redness of right forearm around IV site.  Objective: BP (!) 96/55 (BP Location: Right Arm)   Pulse 98   Temp 98.6 F (37 C) (Oral)   Resp 17   Ht 5' 2 (1.575 m)   Wt 63.5 kg   SpO2 97%   BMI 25.60 kg/m   Examination:  General exam: Appears calm and comfortable Respiratory system: Clear to auscultation. Respiratory  effort normal. Cardiovascular system: S1 & S2 heard, RRR. No murmurs, rubs, gallops or clicks. Gastrointestinal system: Abdomen is nondistended, soft and nontender. Normal bowel sounds heard. Central nervous  system: Alert and oriented. No focal neurological deficits. Skin: Mild erythema noted around right forearm IV site without tenderness   Data Reviewed: I have personally reviewed following labs and imaging studies  CBC Lab Results  Component Value Date   WBC 6.6 10/23/2023   RBC 3.82 (L) 10/23/2023   HGB 8.5 (L) 10/23/2023   HCT 30.8 (L) 10/23/2023   MCV 80.6 10/23/2023   MCH 22.3 (L) 10/23/2023   PLT 167 10/23/2023   MCHC 27.6 (L) 10/23/2023   RDW 18.4 (H) 10/23/2023   LYMPHSABS 2.0 10/23/2023   MONOABS 0.9 10/23/2023   EOSABS 0.1 10/23/2023   BASOSABS 0.0 10/23/2023     Last metabolic panel Lab Results  Component Value Date   NA 136 10/23/2023   K 3.4 (L) 10/23/2023   CL 105 10/23/2023   CO2 23 10/23/2023   BUN 6 10/23/2023   CREATININE 0.59 10/23/2023   GLUCOSE 101 (H) 10/23/2023   GFRNONAA >60 10/23/2023   GFRAA >60 02/02/2019   CALCIUM  8.0 (L) 10/23/2023   PHOS 2.6 06/14/2023   PROT 6.7 10/18/2023   ALBUMIN 2.6 (L) 10/18/2023   BILITOT 0.3 10/18/2023   ALKPHOS 80 10/18/2023   AST 27 10/18/2023   ALT 9 10/18/2023   ANIONGAP 8 10/23/2023    GFR: Estimated Creatinine Clearance: 70.4 mL/min (by C-G formula based on SCr of 0.59 mg/dL).  Recent Results (from the past 240 hours)  Blood Culture (routine x 2)     Status: None   Collection Time: 10/17/23 10:10 PM   Specimen: BLOOD RIGHT ARM  Result Value Ref Range Status   Specimen Description BLOOD RIGHT ARM  Final   Special Requests   Final    BOTTLES DRAWN AEROBIC AND ANAEROBIC Blood Culture adequate volume   Culture   Final    NO GROWTH 5 DAYS Performed at [facility], [address] [place], [place] [address]    Report Status 10/22/2023 FINAL  Final  Blood Culture (routine x 2)     Status:  None   Collection Time: 10/17/23 10:35 PM   Specimen: BLOOD RIGHT ARM  Result Value Ref Range Status   Specimen Description BLOOD RIGHT ARM  Final   Special Requests   Final    BOTTLES DRAWN AEROBIC AND ANAEROBIC Blood Culture adequate volume   Culture   Final    NO GROWTH 5 DAYS Performed at [facility], [address] [place], [place] [address]    Report Status 10/23/2023 FINAL  Final  Urine Culture     Status: Abnormal   Collection Time: 10/18/23 11:10 AM   Specimen: Urine, Random  Result Value Ref Range Status   Specimen Description URINE, RANDOM  Final   Special Requests   Final    NONE Reflexed from [id number] Performed at [facility], [address], [place], [place] [address]    Culture (A)  Final    >=100,000 COLONIES/mL ENTEROCOCCUS FAECIUM 30,000 COLONIES/mL PSEUDOMONAS AERUGINOSA    Report Status 10/20/2023 FINAL  Final   Organism ID, Bacteria ENTEROCOCCUS FAECIUM (A)  Final   Organism ID, Bacteria PSEUDOMONAS AERUGINOSA (A)  Final      Susceptibility   Enterococcus faecium - MIC*    AMPICILLIN  <=2 SENSITIVE Sensitive     NITROFURANTOIN 256 RESISTANT Resistant     VANCOMYCIN  <=0.5 SENSITIVE Sensitive     * >=100,000 COLONIES/mL ENTEROCOCCUS FAECIUM   Pseudomonas aeruginosa - MIC*    CEFTAZIDIME 4 SENSITIVE Sensitive  CIPROFLOXACIN  <=0.25 SENSITIVE Sensitive     GENTAMICIN 4 SENSITIVE Sensitive     IMIPENEM 1 SENSITIVE Sensitive     PIP/TAZO <=4 SENSITIVE Sensitive ug/mL    CEFEPIME  2 SENSITIVE Sensitive     * 30,000 COLONIES/mL PSEUDOMONAS AERUGINOSA  MRSA Next Gen by PCR, Nasal     Status: None   Collection Time: 10/18/23  8:22 PM   Specimen: Nasal Mucosa; Nasal Swab  Result Value Ref Range Status   MRSA by PCR Next Gen NOT DETECTED NOT DETECTED Final    Comment: (NOTE) The GeneXpert MRSA Assay (FDA approved for NASAL specimens only), is one component of a comprehensive MRSA colonization surveillance program. It is not intended to diagnose  MRSA infection nor to guide or monitor treatment for MRSA infections. Test performance is not FDA approved in patients less than 40 years old. Performed at [facility], [address] [place], [place] [address]   Culture, blood (Routine X 2) w Reflex to ID Panel     Status: None (Preliminary result)   Collection Time: 10/22/23  8:32 AM   Specimen: BLOOD LEFT ARM  Result Value Ref Range Status   Specimen Description BLOOD LEFT ARM  Final   Special Requests   Final    BOTTLES DRAWN AEROBIC AND ANAEROBIC Blood Culture adequate volume   Culture   Final    NO GROWTH < 24 HOURS Performed at [facility], [address], [place], [place] [address]    Report Status PENDING  Incomplete  Culture, blood (Routine X 2) w Reflex to ID Panel     Status: None (Preliminary result)   Collection Time: 10/22/23  8:32 AM   Specimen: BLOOD LEFT ARM  Result Value Ref Range Status   Specimen Description BLOOD LEFT ARM  Final   Special Requests   Final    BOTTLES DRAWN AEROBIC AND ANAEROBIC Blood Culture adequate volume   Culture   Final    NO GROWTH < 24 HOURS Performed at [facility], [address] [place], [place] [address]    Report Status PENDING  Incomplete      Radiology Studies: CT ABDOMEN PELVIS WO CONTRAST Result Date: 10/22/2023 EXAM: CT ABDOMEN AND PELVIS WITHOUT CONTRAST 10/22/2023 05:14:02 AM TECHNIQUE: CT of the abdomen and pelvis was performed without the administration of intravenous contrast. Multiplanar reformatted images are provided for review. Automated exposure control, iterative reconstruction, and/or weight based adjustment of the mA/kV was utilized to reduce the radiation dose to as low as reasonably achievable. COMPARISON: 10/17/2023 CLINICAL HISTORY: Abdominal pain, acute, nonlocalized. cirrhosis on admission imaging p/w stercoral colitis c/b R hydroureteronephrosis and acute urinary retention. FINDINGS: LOWER CHEST: No acute abnormality. LIVER: The  contour of the liver is irregular and slightly nodular. Hypertrophy of the lateral segment of the left lobe of the liver is noted. No focal liver lesion identified. GALLBLADDER AND BILE DUCTS: Gallbladder sludge versus noncalcified stones noted within the dependent portion of the gallbladder. No significant gallbladder wall thickening or pericholecystic inflammation. No signs of main duct dilatation, inflammation or mass. SPLEEN: No focal splenic abnormality. PANCREAS: No acute abnormality. ADRENAL GLANDS: The adrenal glands are normal. KIDNEYS, URETERS AND BLADDER: Bilateral kidney stones. The right kidney stone measures 3 mm within the upper pole collecting system. Multiple left kidney stones are noted. The largest is in the inferior pole measuring 6 mm. Unchanged cyst within the anterior cortex of the interpolar right kidney measuring 2.4 cm. No follow up imaging  recommended. Right-sided hydronephrosis has resolved in the interval. No left hydronephrosis. Urinary bladder appears normal. GI AND BOWEL: The stomach is non-distended. No pathologic dilatation of the large or small bowel loops. There are a few prominent small bowel loops with air-fluid levels noted in the right lower quadrant of the abdomen which measure up to 2.2 cm. Persistent moderate stool burden noted throughout the colon. The appendix is visualized and appears normal. A large volume of desiccated stool is identified within the rectum which is only mildly diminished when compared with the previous exam. Persistent circumferential wall thickening of the rectum with mild perirectal soft tissue haziness. PERITONEUM AND RETROPERITONEUM: No free fluid or fluid collections. No signs of pneumoperitoneum. VASCULATURE: Aortic atherosclerosis. Upper abdominal varicosities. LYMPH NODES: No abdominal or pelvic adenopathy. REPRODUCTIVE ORGANS: No acute abnormality. BONES AND SOFT TISSUES: Bilateral areas of ischial decubitus ulcerations are present, which  extend up to the bone. This is similar to the previous exam. Asymmetric increased sclerosis involving the right ischial tuberosity is unchanged (image 114/3). Imaging findings most likely reflect sequelae of chronic osteomyelitis. There is loss of the cortex overlying the left ischial tuberosity, which is concerning for acute osteomyelitis (sagittal image 107/7). No associated fluid collections identified. IMPRESSION: 1. Persistent circumferential wall thickening of the rectum with mild perirectal soft tissue haziness, concerning for stercoral colitis. 2. Large volume of retained stool throughout the colon compatible with constipation. 3. Bilateral areas of ischial decubitus ulcerations, similar to the previous exam Loss of the cortex overlying the left ischial tuberosity, concerning for acute osteomyelitis. No associated fluid collections identified. 4. Bilateral kidney stones. Right-sided hydronephrosis has resolved in the interval. 5. Morphologic features of the liver compatible with cirrhosis. Stigmata of portal venous hypertension noted, including upper abdominal varicosities. Electronically signed by: Waddell Calk MD 10/22/2023 06:07 AM EDT RP Workstation: [id number]   DG Abd 1 View Result Date: 10/22/2023 CLINICAL DATA:  Abdominal pain EXAM: ABDOMEN - 1 VIEW COMPARISON:  12/22/2020 FINDINGS: Scattered large and small bowel gas is noted. Fecal material is noted throughout the colon consistent with colonic constipation. Considerable fecal material is noted within the rectum consistent with impaction. No free air is seen. No bony abnormality is noted. IMPRESSION: Changes consistent with colonic constipation and rectal impaction. Electronically Signed   By: Oneil Devonshire M.D.   On: 10/22/2023 03:08      LOS: 5 days   CRITICAL CARE Performed by: Elgin Lam   Total critical care time: 40 minutes  Critical care time was exclusive of separately billable procedures and treating other  patients.  Critical care was necessary to treat or prevent imminent or life-threatening deterioration.  Critical care was time spent personally by me on the following activities: development of treatment plan with patient and/or surrogate as well as nursing, discussions with consultants, evaluation of patient's response to treatment, examination of patient, obtaining history from patient or surrogate, ordering and performing treatments and interventions, ordering and review of laboratory studies, ordering and review of radiographic studies, pulse oximetry and re-evaluation of patient's condition.   Elgin Lam, MD [facility] 10/23/2023, 11:17 AM   If 7PM-7AM, please contact night-coverage [email or web address]

## 2023-10-23 NOTE — Progress Notes (Signed)
 Progress Note    ASSESSMENT AND PLAN:   Fecal impaction Chronic constipation Stercoral proctitis.  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 No obvious varices. No ascites or HE.  Stage IV decubitus ulcer with osteomyelitis. MS/paraplegia/bedbound   Plan: - Repeat smog enemas have x 2 - MiraLAX /Gatorade p.o. - Repeat x-ray KUB in AM. - Appreciate ID following.      SUBJECTIVE   Had 4 large bowel movements after enemas last night.  No obvious blood. No abdominal pain- She does not feel any discomfort anyway  X-ray KUB not ready yet but still with significant stool in the rectum.  No obvious obstruction.    OBJECTIVE:     Vital signs in last 24 hours: Temp:  [97.6 F (36.4 C)-99.2 F (37.3 C)] 98.6 F (37 C) (08/14 1102) Pulse Rate:  [90-108] 98 (08/14 1102) Resp:  [14-20] 17 (08/14 1102) BP: (88-113)/(51-78) 96/55 (08/14 1102) SpO2:  [95 %-100 %] 97 % (08/14 0411) Weight:  [63.5 kg] 63.5 kg (08/14 0355) Last BM Date : 10/23/23 General:   Alert, well-developed, in NAD EENT:  Normal hearing, non icteric sclera, conjunctive pink.  Abdomen:  Soft, nondistended, nontender.  Normal bowel sounds,.       Neurologic:  Alert and  oriented x4;  grossly normal neurologically. Psych:  Pleasant, cooperative.  Normal mood and affect.   Intake/Output from previous day: 08/13 0701 - 08/14 0700 In: 3317.8 [I.V.:1662.6; IV Piggyback:1655.2] Out: 1250 [Urine:1250] Intake/Output this shift: Total I/O In: 120 [P.O.:120] Out: -   Lab Results: Recent Labs    10/22/23 0238 10/23/23 0332  WBC 4.9 6.6  HGB 8.6* 8.5*  HCT 30.7* 30.8*  PLT 146* 167   BMET Recent Labs    10/21/23 0348 10/23/23 0332  NA 139 136  K 3.7 3.4*  CL 107 105  CO2 23 23  GLUCOSE 98 101*  BUN 11 6  CREATININE 0.67 0.59  CALCIUM  8.2* 8.0*   LFT No results for input(s): PROT, ALBUMIN, AST, ALT, ALKPHOS, BILITOT,  BILIDIR, IBILI in the last 72 hours. PT/INR No results for input(s): LABPROT, INR in the last 72 hours. Hepatitis Panel No results for input(s): HEPBSAG, HCVAB, HEPAIGM, HEPBIGM in the last 72 hours.  CT ABDOMEN PELVIS WO CONTRAST Result Date: 10/22/2023 EXAM: CT ABDOMEN AND PELVIS WITHOUT CONTRAST 10/22/2023 05:14:02 AM TECHNIQUE: CT of the abdomen and pelvis was performed without the administration of intravenous contrast. Multiplanar reformatted images are provided for review. Automated exposure control, iterative reconstruction, and/or weight based adjustment of the mA/kV was utilized to reduce the radiation dose to as low as reasonably achievable. COMPARISON: 10/17/2023 CLINICAL HISTORY: Abdominal pain, acute, nonlocalized. cirrhosis on admission imaging p/w stercoral colitis c/b R hydroureteronephrosis and acute urinary retention. FINDINGS: LOWER CHEST: No acute abnormality. LIVER: The contour of the liver is irregular and slightly nodular. Hypertrophy of the lateral segment of the left lobe of the liver is noted. No focal liver lesion identified. GALLBLADDER AND BILE DUCTS: Gallbladder sludge versus noncalcified stones noted within the dependent portion of the gallbladder. No significant gallbladder wall thickening or pericholecystic inflammation. No signs of main duct dilatation, inflammation or mass. SPLEEN: No focal splenic abnormality. PANCREAS: No acute abnormality. ADRENAL GLANDS: The adrenal glands are normal. KIDNEYS, URETERS AND BLADDER: Bilateral kidney stones. The right kidney stone measures 3 mm within the upper pole collecting system. Multiple left kidney stones are noted. The largest is in the inferior pole measuring 6  mm. Unchanged cyst within the anterior cortex of the interpolar right kidney measuring 2.4 cm. No follow up imaging recommended. Right-sided hydronephrosis has resolved in the interval. No left hydronephrosis. Urinary bladder appears normal. GI AND BOWEL:  The stomach is non-distended. No pathologic dilatation of the large or small bowel loops. There are a few prominent small bowel loops with air-fluid levels noted in the right lower quadrant of the abdomen which measure up to 2.2 cm. Persistent moderate stool burden noted throughout the colon. The appendix is visualized and appears normal. A large volume of desiccated stool is identified within the rectum which is only mildly diminished when compared with the previous exam. Persistent circumferential wall thickening of the rectum with mild perirectal soft tissue haziness. PERITONEUM AND RETROPERITONEUM: No free fluid or fluid collections. No signs of pneumoperitoneum. VASCULATURE: Aortic atherosclerosis. Upper abdominal varicosities. LYMPH NODES: No abdominal or pelvic adenopathy. REPRODUCTIVE ORGANS: No acute abnormality. BONES AND SOFT TISSUES: Bilateral areas of ischial decubitus ulcerations are present, which extend up to the bone. This is similar to the previous exam. Asymmetric increased sclerosis involving the right ischial tuberosity is unchanged (image 114/3). Imaging findings most likely reflect sequelae of chronic osteomyelitis. There is loss of the cortex overlying the left ischial tuberosity, which is concerning for acute osteomyelitis (sagittal image 107/7). No associated fluid collections identified. IMPRESSION: 1. Persistent circumferential wall thickening of the rectum with mild perirectal soft tissue haziness, concerning for stercoral colitis. 2. Large volume of retained stool throughout the colon compatible with constipation. 3. Bilateral areas of ischial decubitus ulcerations, similar to the previous exam Loss of the cortex overlying the left ischial tuberosity, concerning for acute osteomyelitis. No associated fluid collections identified. 4. Bilateral kidney stones. Right-sided hydronephrosis has resolved in the interval. 5. Morphologic features of the liver compatible with cirrhosis. Stigmata  of portal venous hypertension noted, including upper abdominal varicosities. Electronically signed by: Waddell Calk MD 10/22/2023 06:07 AM EDT RP Workstation: [id number]   DG Abd 1 View Result Date: 10/22/2023 CLINICAL DATA:  Abdominal pain EXAM: ABDOMEN - 1 VIEW COMPARISON:  12/22/2020 FINDINGS: Scattered large and small bowel gas is noted. Fecal material is noted throughout the colon consistent with colonic constipation. Considerable fecal material is noted within the rectum consistent with impaction. No free air is seen. No bony abnormality is noted. IMPRESSION: Changes consistent with colonic constipation and rectal impaction. Electronically Signed   By: Oneil Devonshire M.D.   On: 10/22/2023 03:08     Principal Problem:   Stercoral colitis Active Problems:   Constipation   Decubitus skin ulcer     LOS: 5 days     Anselm Bring, MD 10/23/2023, 1:36 PM [facility] [phone]

## 2023-10-24 ENCOUNTER — Inpatient Hospital Stay (HOSPITAL_COMMUNITY)

## 2023-10-24 DIAGNOSIS — A419 Sepsis, unspecified organism: Secondary | ICD-10-CM | POA: Diagnosis not present

## 2023-10-24 DIAGNOSIS — R6520 Severe sepsis without septic shock: Secondary | ICD-10-CM | POA: Diagnosis not present

## 2023-10-24 DIAGNOSIS — K5289 Other specified noninfective gastroenteritis and colitis: Secondary | ICD-10-CM | POA: Diagnosis not present

## 2023-10-24 DIAGNOSIS — K5909 Other constipation: Secondary | ICD-10-CM

## 2023-10-24 LAB — HEMOGLOBIN AND HEMATOCRIT, BLOOD
HCT: 27.1 % — ABNORMAL LOW (ref 36.0–46.0)
Hemoglobin: 7.5 g/dL — ABNORMAL LOW (ref 12.0–15.0)

## 2023-10-24 LAB — LACTIC ACID, PLASMA
Lactic Acid, Venous: 2.4 mmol/L (ref 0.5–1.9)
Lactic Acid, Venous: 1.5 mmol/L (ref 0.5–1.9)

## 2023-10-24 LAB — CBC WITH DIFFERENTIAL/PLATELET
Abs Immature Granulocytes: 0.01 K/uL (ref 0.00–0.07)
Basophils Absolute: 0 K/uL (ref 0.0–0.1)
Basophils Relative: 1 %
Eosinophils Absolute: 0.2 K/uL (ref 0.0–0.5)
Eosinophils Relative: 4 %
HCT: 24.8 % — ABNORMAL LOW (ref 36.0–46.0)
Hemoglobin: 7 g/dL — ABNORMAL LOW (ref 12.0–15.0)
Immature Granulocytes: 0 %
Lymphocytes Relative: 33 %
Lymphs Abs: 1.8 K/uL (ref 0.7–4.0)
MCH: 22.9 pg — ABNORMAL LOW (ref 26.0–34.0)
MCHC: 28.2 g/dL — ABNORMAL LOW (ref 30.0–36.0)
MCV: 81 fL (ref 80.0–100.0)
Monocytes Absolute: 0.6 K/uL (ref 0.1–1.0)
Monocytes Relative: 11 %
Neutro Abs: 2.8 K/uL (ref 1.7–7.7)
Neutrophils Relative %: 51 %
Platelets: 154 K/uL (ref 150–400)
RBC: 3.06 MIL/uL — ABNORMAL LOW (ref 3.87–5.11)
RDW: 18.3 % — ABNORMAL HIGH (ref 11.5–15.5)
WBC: 5.5 K/uL (ref 4.0–10.5)
nRBC: 0 % (ref 0.0–0.2)

## 2023-10-24 LAB — HEPATITIS PANEL, ACUTE
HCV Ab: NONREACTIVE
Hep A IgM: NONREACTIVE
Hep B C IgM: NONREACTIVE
Hepatitis B Surface Ag: NONREACTIVE

## 2023-10-24 MED ORDER — SODIUM CHLORIDE 0.9 % IV BOLUS
250.0000 mL | Freq: Once | INTRAVENOUS | Status: AC
  Administered 2023-10-24: 250 mL via INTRAVENOUS

## 2023-10-24 MED ORDER — SODIUM CHLORIDE 0.9 % IV SOLN: INTRAVENOUS | Status: AC

## 2023-10-24 MED ORDER — MIDODRINE HCL 5 MG PO TABS
2.5000 mg | ORAL_TABLET | Freq: Three times a day (TID) | ORAL | Status: DC
  Administered 2023-10-24 – 2023-10-26 (×6): 2.5 mg via ORAL
  Filled 2023-10-24 (×7): qty 1

## 2023-10-24 NOTE — Progress Notes (Signed)
     Progress Note    ASSESSMENT AND PLAN:   Fecal impaction (resolved) Chronic constipation Stercoral proctitis. No bleeding.  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 No obvious varices. No ascites or HE.  Stage IV decubitus ulcer with osteomyelitis. MS/paraplegia/bedbound  Plan: -Stop enemas -Stop miralax  (per pt too much diarrhea).  She understands that she would need it at discharge to prevent fecal impaction in future. -Continue senna. Can decrease it to once a day -Trend CBC.  Transfuse if Hb <7.  No overt GI bleeding or ischemic colitis.  Hold off on any endoscopic procedures -GI will sign off for now.  Please call if with any questions  D/W nursing staff.     SUBJECTIVE   Had several bowel movements after SMOG enemas, now with diarrhea Denies having any abdominal pain No nausea or vomiting. No GI bleeding.  Hb drifting down-expected. X-ray KUB done today reviewed.  Much better than before.  Await official report    OBJECTIVE:     Vital signs in last 24 hours: Temp:  [98.3 F (36.8 C)-98.7 F (37.1 C)] 98.5 F (36.9 C) (08/15 0355) Pulse Rate:  [96-100] 100 (08/15 0355) Resp:  [16-18] 17 (08/15 0355) BP: (89-106)/(50-61) 89/50 (08/15 0355) SpO2:  [97 %-100 %] 100 % (08/15 0355) Last BM Date : 10/23/23 General:   Alert, well-developed, in NAD EENT:  Normal hearing, non icteric sclera, conjunctive pink.  Abdomen:  Soft, nondistended, nontender.  Normal bowel sounds,.       Neurologic:  Alert and  oriented x4;   Psych:  Pleasant, cooperative.  Normal mood and affect.   Intake/Output from previous day: 08/14 0701 - 08/15 0700 In: 2535.7 [P.O.:720; I.V.:1615.7; IV Piggyback:200.1] Out: 100 [Urine:100] Intake/Output this shift: Total I/O In: 120 [P.O.:120] Out: -   Lab Results: Recent Labs    10/22/23 0238 10/23/23 0332 10/24/23 0430 10/24/23 0843  WBC 4.9 6.6 5.5  --   HGB 8.6* 8.5* 7.0* 7.5*  HCT  30.7* 30.8* 24.8* 27.1*  PLT 146* 167 154  --    BMET Recent Labs    10/23/23 0332  NA 136  K 3.4*  CL 105  CO2 23  GLUCOSE 101*  BUN 6  CREATININE 0.59  CALCIUM  8.0*   LFT No results for input(s): PROT, ALBUMIN, AST, ALT, ALKPHOS, BILITOT, BILIDIR, IBILI in the last 72 hours. PT/INR No results for input(s): LABPROT, INR in the last 72 hours. Hepatitis Panel Recent Labs    10/24/23 0434  HEPBSAG NON REACTIVE  HCVAB NON REACTIVE  HEPAIGM NON REACTIVE  HEPBIGM NON REACTIVE    DG Abd 1 View Result Date: 10/23/2023 CLINICAL DATA:  Possible fecal impaction. EXAM: ABDOMEN - 1 VIEW COMPARISON:  10/22/2023 FINDINGS: Exam demonstrates continued evidence moderate fecal retention throughout the colon most notable over the rectum without significant change. No free peritoneal air. Remainder the exam is unchanged. IMPRESSION: Continued evidence of moderate fecal retention throughout the colon most notable over the rectum without significant change. Electronically Signed   By: Toribio Agreste M.D.   On: 10/23/2023 13:44     Principal Problem:   Stercoral colitis Active Problems:   Constipation   Decubitus skin ulcer     LOS: 6 days     Anselm Bring, MD 10/24/2023, 10:22 AM [facility] [phone]

## 2023-10-24 NOTE — Progress Notes (Signed)
 PROGRESS NOTE    [name]  FMW:[id number] DOB: May 02, 1970 DOA: 10/17/2023 PCP: [facility]   Brief Narrative: [name] is a 53 y.o. female with a history of multiple sclerosis, cirrhosis.  Patient presented secondary to fever and was found to have evidence of sterile coral colitis in addition to right hydroureteronephrosis and acute urinary retentio; urine studies suggestive of a urinary tract infection.  Patient started on antibiotics but developed severe sepsis physiology and repeat imaging concerning for possible osteomyelitis in addition to persistent stercoral colitis.   Assessment and Plan:  Stercoral colitis Present on admission and presumed secondary to severe constipation. Diagnosed via CT imaging. Patient started on empiric antibiotics. General surgery consulted and evaluated, in setting of sepsis, with no evidence of peritonitis and no need for surgical management. GI consulted and started SMOG enema regimen with great success, however patient with persistent large volume of stool seen on imaging. SMOG enema repeated. -GI recommendations: pending today; x-ray pending -MiraLAX , Senokot-S  Severe sepsis Not present on admission. Possibly secondary to stercoral colitis, although there is possible ischial osteomyelitis noted on CT imaging. Hypotension and lactic acidosis present. Repeat blood cultures obtained (8/13) and antibiotics broadened to Vancomycin  and Zosyn  IV. Abdomen not consistent with peritonitis. Hypotension responding to IV fluid boluses. Vancomycin  and Zosyn  transitioned back to Unasyn  per ID. Blood cultures with no growth to date. 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 On empiric antibiotics for sepsis. Patient with a known and chronic (over 2 years) buttock wound and is followed by wound care as an outpatient. Patient started empirically on  vancomycin  and Zosyn  for sepsis physiology. ID consulted and will unlikely commit to treating for several weeks as acute infection -Consult ID for recommendations: Ceftriaxone /Flagyl   IV site rash Possibly medication related, but unclear. No symptoms. Improving.  Constipation Persistent constipation noted on CT imaging. Per nursing, patient has had daily bowel movements. Patient with significant bowel movements after SMOG enemas. -MiraLAX  BID -Senokot-S BID  Right hydronephrosis, resolved Noted on CT imaging. No associated AKI. Foley catheter placed. Urology consulted with recommendation for follow-up renal ultrasound, which was performed on 8/11 and negative for hydronephrosis.  UTI, resolved Urine culture significant for Enterococcus Faecium and Pseudomonas Aeruginosa. Patient completed treatment with Unasyn  and Ciprofloxacin .  Cirrhosis Portal hypertension Noted. Patient will need outpatient GI follow-up.  Hypokalemia -Potassium supplementation as needed  Pill dysphagia Noted. Chronic issue.  Microcytic anemia Baseline hemoglobin of around 10-11. Hemoglobin of 9.1 on admission and has drifted down slightly. Patient's anemia panel suggests iron  deficiency. IV iron  ordered while inpatient. Acute drop in hemoglobin to 7 today of unclear etiology. Repeat hemoglobin stable at 7.5. Possible GI source. -Trend CBC/H&H  Pressure injury Right/upper buttocks. Present on admission.   DVT prophylaxis: SCDs Code Status:   Code Status: Full Code Family Communication: None at bedside Disposition Plan: Discharge likely to SNF pending continued medical workup/management of sepsis   Consultants:  Urology General surgery Gastroenterology Infectious disease  Procedures:  None  Antimicrobials: Ceftriaxone  Flagyl  Unasyn  Ciprofloxacin  Vancomycin  Zosyn     Subjective: More bowel movements overnight. No other issues overnight.  Objective: BP (!) 89/50 (BP Location: Right Arm)    Pulse 100   Temp 98.5 F (36.9 C) (Oral)   Resp 17   Ht 5' 2 (1.575 m)   Wt 63.5 kg   SpO2 100%   BMI 25.60 kg/m   Examination:  General exam: Appears  calm and comfortable Respiratory system: Clear to auscultation. Respiratory effort normal. Cardiovascular system: S1 & S2 heard, RRR. No murmurs, rubs, gallops or clicks. Gastrointestinal system: Abdomen is nondistended, soft and nontender. Normal bowel sounds heard. Central nervous system: Alert and oriented. No focal neurological deficits. Skin: Minimal erythema noted around right forearm IV site without tenderness   Data Reviewed: I have personally reviewed following labs and imaging studies  CBC Lab Results  Component Value Date   WBC 5.5 10/24/2023   RBC 3.06 (L) 10/24/2023   HGB 7.5 (L) 10/24/2023   HCT 27.1 (L) 10/24/2023   MCV 81.0 10/24/2023   MCH 22.9 (L) 10/24/2023   PLT 154 10/24/2023   MCHC 28.2 (L) 10/24/2023   RDW 18.3 (H) 10/24/2023   LYMPHSABS 1.8 10/24/2023   MONOABS 0.6 10/24/2023   EOSABS 0.2 10/24/2023   BASOSABS 0.0 10/24/2023     Last metabolic panel Lab Results  Component Value Date   NA 136 10/23/2023   K 3.4 (L) 10/23/2023   CL 105 10/23/2023   CO2 23 10/23/2023   BUN 6 10/23/2023   CREATININE 0.59 10/23/2023   GLUCOSE 101 (H) 10/23/2023   GFRNONAA >60 10/23/2023   GFRAA >60 02/02/2019   CALCIUM  8.0 (L) 10/23/2023   PHOS 2.6 06/14/2023   PROT 6.7 10/18/2023   ALBUMIN 2.6 (L) 10/18/2023   BILITOT 0.3 10/18/2023   ALKPHOS 80 10/18/2023   AST 27 10/18/2023   ALT 9 10/18/2023   ANIONGAP 8 10/23/2023    GFR: Estimated Creatinine Clearance: 70.4 mL/min (by C-G formula based on SCr of 0.59 mg/dL).  Recent Results (from the past 240 hours)  Blood Culture (routine x 2)     Status: None   Collection Time: 10/17/23 10:10 PM   Specimen: BLOOD RIGHT ARM  Result Value Ref Range Status   Specimen Description BLOOD RIGHT ARM  Final   Special Requests   Final    BOTTLES DRAWN AEROBIC  AND ANAEROBIC Blood Culture adequate volume   Culture   Final    NO GROWTH 5 DAYS Performed at [facility], [address], [place], [place] [address]    Report Status 10/22/2023 FINAL  Final  Blood Culture (routine x 2)     Status: None   Collection Time: 10/17/23 10:35 PM   Specimen: BLOOD RIGHT ARM  Result Value Ref Range Status   Specimen Description BLOOD RIGHT ARM  Final   Special Requests   Final    BOTTLES DRAWN AEROBIC AND ANAEROBIC Blood Culture adequate volume   Culture   Final    NO GROWTH 5 DAYS Performed at [facility], [address], [place], [place] [address]    Report Status 10/23/2023 FINAL  Final  Urine Culture     Status: Abnormal   Collection Time: 10/18/23 11:10 AM   Specimen: Urine, Random  Result Value Ref Range Status   Specimen Description URINE, RANDOM  Final   Special Requests   Final    NONE Reflexed from [id number] Performed at [facility], [address] [place], [place] [address]    Culture (A)  Final    >=100,000 COLONIES/mL ENTEROCOCCUS FAECIUM 30,000 COLONIES/mL PSEUDOMONAS AERUGINOSA    Report Status 10/20/2023 FINAL  Final   Organism ID, Bacteria ENTEROCOCCUS FAECIUM (A)  Final   Organism ID, Bacteria PSEUDOMONAS AERUGINOSA (A)  Final      Susceptibility   Enterococcus faecium - MIC*    AMPICILLIN  <=2 SENSITIVE Sensitive  NITROFURANTOIN 256 RESISTANT Resistant     VANCOMYCIN  <=0.5 SENSITIVE Sensitive     * >=100,000 COLONIES/mL ENTEROCOCCUS FAECIUM   Pseudomonas aeruginosa - MIC*    CEFTAZIDIME 4 SENSITIVE Sensitive     CIPROFLOXACIN  <=0.25 SENSITIVE Sensitive     GENTAMICIN 4 SENSITIVE Sensitive     IMIPENEM 1 SENSITIVE Sensitive     PIP/TAZO <=4 SENSITIVE Sensitive ug/mL    CEFEPIME  2 SENSITIVE Sensitive     * 30,000 COLONIES/mL PSEUDOMONAS AERUGINOSA  MRSA Next Gen by PCR, Nasal     Status: None   Collection Time: 10/18/23  8:22 PM   Specimen: Nasal Mucosa; Nasal Swab  Result Value Ref Range Status    MRSA by PCR Next Gen NOT DETECTED NOT DETECTED Final    Comment: (NOTE) The GeneXpert MRSA Assay (FDA approved for NASAL specimens only), is one component of a comprehensive MRSA colonization surveillance program. It is not intended to diagnose MRSA infection nor to guide or monitor treatment for MRSA infections. Test performance is not FDA approved in patients less than 59 years old. Performed at [facility], [address], [place], [place] [address]   Culture, blood (Routine X 2) w Reflex to ID Panel     Status: None (Preliminary result)   Collection Time: 10/22/23  8:32 AM   Specimen: BLOOD LEFT ARM  Result Value Ref Range Status   Specimen Description BLOOD LEFT ARM  Final   Special Requests   Final    BOTTLES DRAWN AEROBIC AND ANAEROBIC Blood Culture adequate volume   Culture   Final    NO GROWTH 2 DAYS Performed at [facility], [address], [place], [place] [address]    Report Status PENDING  Incomplete  Culture, blood (Routine X 2) w Reflex to ID Panel     Status: None (Preliminary result)   Collection Time: 10/22/23  8:32 AM   Specimen: BLOOD LEFT ARM  Result Value Ref Range Status   Specimen Description BLOOD LEFT ARM  Final   Special Requests   Final    BOTTLES DRAWN AEROBIC AND ANAEROBIC Blood Culture adequate volume   Culture   Final    NO GROWTH 2 DAYS Performed at [facility], [address] [place], [place] [address]    Report Status PENDING  Incomplete      Radiology Studies: DG Abd 1 View Result Date: 10/23/2023 CLINICAL DATA:  Possible fecal impaction. EXAM: ABDOMEN - 1 VIEW COMPARISON:  10/22/2023 FINDINGS: Exam demonstrates continued evidence moderate fecal retention throughout the colon most notable over the rectum without significant change. No free peritoneal air. Remainder the exam is unchanged. IMPRESSION: Continued evidence of moderate fecal retention throughout the colon most notable over the rectum without significant  change. Electronically Signed   By: Toribio Agreste M.D.   On: 10/23/2023 13:44      LOS: 6 days   CRITICAL CARE Performed by: Elgin Lam   Total critical care time: 40 minutes  Critical care time was exclusive of separately billable procedures and treating other patients.  Critical care was necessary to treat or prevent imminent or life-threatening deterioration.  Critical care was time spent personally by me on the following activities: development of treatment plan with patient and/or surrogate as well as nursing, discussions with consultants, evaluation of patient's response to treatment, examination of patient, obtaining history from patient or surrogate, ordering and performing treatments and interventions, ordering and review of laboratory studies, ordering and review of  radiographic studies, pulse oximetry and re-evaluation of patient's condition.   Elgin Lam, MD [facility] 10/24/2023, 10:14 AM   If 7PM-7AM, please contact night-coverage [email or web address]

## 2023-10-24 NOTE — Progress Notes (Signed)
 Patients blood pressure low this afternoon 95/48 (62). Patient denies dizziness and completed 250 bolus. Paged primary MD and got orders for midodrine . Will give and continue to monitor blood pressure.

## 2023-10-24 NOTE — Progress Notes (Signed)
 Pharmacy Antimicrobial Stewardship Note  52 YOF on antibiotics for colitis and chronic ischial ulcer/osteo. Clarified with Dr. Luiz that plan was for 7d for the colitis but 14d for the ischial ulcer/osteo/SSTI. Will add a stop date for the Rocephin /Flagyl  for 8/22, can switch to oral Augmentin  when/if ready for discharge.   Thank you for allowing pharmacy to be a part of this patient's care.  Almarie Lunger, PharmD, BCPS, BCIDP Infectious Diseases Clinical Pharmacist 10/24/2023 11:23 AM   **Pharmacist phone directory can now be found on [email or web address] ([email or web address]).  Listed under [facility].

## 2023-10-25 DIAGNOSIS — K5289 Other specified noninfective gastroenteritis and colitis: Secondary | ICD-10-CM | POA: Diagnosis not present

## 2023-10-25 DIAGNOSIS — R6520 Severe sepsis without septic shock: Secondary | ICD-10-CM | POA: Diagnosis not present

## 2023-10-25 DIAGNOSIS — A419 Sepsis, unspecified organism: Secondary | ICD-10-CM | POA: Diagnosis not present

## 2023-10-25 LAB — CBC WITH DIFFERENTIAL/PLATELET
Abs Immature Granulocytes: 0.02 K/uL (ref 0.00–0.07)
Basophils Absolute: 0 K/uL (ref 0.0–0.1)
Basophils Relative: 1 %
Eosinophils Absolute: 0.2 K/uL (ref 0.0–0.5)
Eosinophils Relative: 4 %
HCT: 26.7 % — ABNORMAL LOW (ref 36.0–46.0)
Hemoglobin: 7.4 g/dL — ABNORMAL LOW (ref 12.0–15.0)
Immature Granulocytes: 0 %
Lymphocytes Relative: 40 %
Lymphs Abs: 2.3 K/uL (ref 0.7–4.0)
MCH: 22.8 pg — ABNORMAL LOW (ref 26.0–34.0)
MCHC: 27.7 g/dL — ABNORMAL LOW (ref 30.0–36.0)
MCV: 82.4 fL (ref 80.0–100.0)
Monocytes Absolute: 0.5 K/uL (ref 0.1–1.0)
Monocytes Relative: 9 %
Neutro Abs: 2.7 K/uL (ref 1.7–7.7)
Neutrophils Relative %: 46 %
Platelets: 181 K/uL (ref 150–400)
RBC: 3.24 MIL/uL — ABNORMAL LOW (ref 3.87–5.11)
RDW: 18.6 % — ABNORMAL HIGH (ref 11.5–15.5)
WBC: 5.8 K/uL (ref 4.0–10.5)
nRBC: 0 % (ref 0.0–0.2)

## 2023-10-25 LAB — BASIC METABOLIC PANEL WITH GFR
Anion gap: 5 (ref 5–15)
BUN: 5 mg/dL — ABNORMAL LOW (ref 6–20)
CO2: 26 mmol/L (ref 22–32)
Calcium: 7.5 mg/dL — ABNORMAL LOW (ref 8.9–10.3)
Chloride: 107 mmol/L (ref 98–111)
Creatinine, Ser: 0.52 mg/dL (ref 0.44–1.00)
GFR, Estimated: >60 mL/min (ref >60)
Glucose, Bld: 109 mg/dL — ABNORMAL HIGH (ref 70–99)
Potassium: 3.1 mmol/L — ABNORMAL LOW (ref 3.5–5.1)
Sodium: 138 mmol/L (ref 135–145)

## 2023-10-25 MED ORDER — POTASSIUM CHLORIDE CRYS ER 20 MEQ PO TBCR
40.0000 meq | EXTENDED_RELEASE_TABLET | ORAL | Status: AC
  Administered 2023-10-25 (×2): 40 meq via ORAL
  Filled 2023-10-25 (×2): qty 2

## 2023-10-25 NOTE — Plan of Care (Signed)
   Problem: Health Behavior/Discharge Planning: Goal: Ability to manage health-related needs will improve Outcome: Progressing

## 2023-10-25 NOTE — Progress Notes (Signed)
 PROGRESS NOTE    NAW LASALA  FMW:[id number] DOB: 08/11/1970 DOA: 10/17/2023 PCP: [facility]   Brief Narrative: [name] is a 53 y.o. female with a history of multiple sclerosis, cirrhosis.  Patient presented secondary to fever and was found to have evidence of sterile coral colitis in addition to right hydroureteronephrosis and acute urinary retentio; urine studies suggestive of a urinary tract infection.  Patient started on antibiotics but developed severe sepsis physiology and repeat imaging concerning for possible osteomyelitis in addition to persistent stercoral colitis.   Assessment and Plan:  Stercoral colitis Present on admission and presumed secondary to severe constipation. Diagnosed via CT imaging. Patient started on empiric antibiotics. General surgery consulted and evaluated, in setting of sepsis, with no evidence of peritonitis and no need for surgical management. GI consulted and started SMOG enema regimen with great success, however patient with persistent large volume of stool seen on imaging. SMOG enema repeated. Patient's x-ray improved with continued bowel regimen. GI signed off. -Continue Ceftriaxone  and Flagyl  with plan for 7-day course; switch to Augmentin  on discharge -MiraLAX  on discharge  Severe sepsis Not present on admission. Possibly secondary to stercoral colitis, although there is possible ischial osteomyelitis noted on CT imaging. Hypotension and lactic acidosis present. Repeat blood cultures obtained (8/13) and antibiotics broadened to Vancomycin  and Zosyn  IV. Abdomen not consistent with peritonitis. Hypotension responding to IV fluid boluses. Vancomycin  and Zosyn  transitioned back to Unasyn  per ID and then to Ceftriaxone  and Flagyl . Blood cultures with no growth to date. 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 On empiric antibiotics for  sepsis. Patient with a known and chronic (over 2 years) buttock wound and is followed by wound care as an outpatient. Patient started empirically on vancomycin  and Zosyn  for sepsis physiology. ID consulted and will unlikely commit to treating for several weeks as acute infection -Consult ID for recommendations: Ceftriaxone /Flagyl  with recommendation for 2 week course; transition to Augmentin  on discharge  IV site rash Possibly medication related, but unclear. No symptoms. Improved.  Constipation Complicated by bedbound status from MS and paraplegia. Persistent constipation noted on CT imaging. Per nursing, patient has had daily bowel movements. Patient with significant bowel movements after SMOG enemas and MiraLAX .  Right hydronephrosis, resolved Noted on CT imaging. No associated AKI. Foley catheter placed. Urology consulted with recommendation for follow-up renal ultrasound, which was performed on 8/11 and negative for hydronephrosis.  UTI, resolved Urine culture significant for Enterococcus Faecium and Pseudomonas Aeruginosa. Patient completed treatment with Unasyn  and Ciprofloxacin .  Cirrhosis Portal hypertension Noted. Patient will need outpatient GI follow-up.  Hypokalemia -Potassium supplementation as needed  Pill dysphagia Noted. Chronic issue.  Microcytic anemia Baseline hemoglobin of around 10-11. Hemoglobin of 9.1 on admission and has drifted down slightly. Patient's anemia panel suggests iron  deficiency. IV iron  ordered while inpatient. Acute drop in hemoglobin to 7 of unclear etiology. Possible GI source however no overt bleeding noted. Hemoglobin remains stable. -Trend CBC/H&H  History of multiple sclerosis Paraplegia Patient is bedbound.  Pressure injury Right/upper buttocks. Present on admission.   DVT prophylaxis: SCDs Code Status:   Code Status: Full Code Family Communication: None at bedside Disposition Plan: Discharge home in 2-3 days pending improved blood  pressure and transition to outpatient antibiotics   Consultants:  Urology General surgery Gastroenterology Infectious disease  Procedures:  None  Antimicrobials: Ceftriaxone  Flagyl  Unasyn  Ciprofloxacin  Vancomycin  Zosyn     Subjective: No issues  overnight. Feels well this morning.  Objective: BP (!) 101/58 (BP Location: Right Arm)   Pulse 84   Temp 98.4 F (36.9 C) (Oral)   Resp 14   Ht 5' 2 (1.575 m)   Wt 68.1 kg   SpO2 97%   BMI 27.46 kg/m   Examination:  General exam: Appears calm and comfortable Respiratory system: Clear to auscultation. Respiratory effort normal. Cardiovascular system: S1 & S2 heard, RRR. No murmurs. Gastrointestinal system: Abdomen is nondistended, soft and nontender. Normal bowel sounds heard. Central nervous system: Alert and oriented. Musculoskeletal: BLE foot edema. No calf tenderness Skin: No cyanosis. No rashes Psychiatry: Judgement and insight appear normal. Mood & affect appropriate.    Data Reviewed: I have personally reviewed following labs and imaging studies  CBC Lab Results  Component Value Date   WBC 5.8 10/25/2023   RBC 3.24 (L) 10/25/2023   HGB 7.4 (L) 10/25/2023   HCT 26.7 (L) 10/25/2023   MCV 82.4 10/25/2023   MCH 22.8 (L) 10/25/2023   PLT 181 10/25/2023   MCHC 27.7 (L) 10/25/2023   RDW 18.6 (H) 10/25/2023   LYMPHSABS 2.3 10/25/2023   MONOABS 0.5 10/25/2023   EOSABS 0.2 10/25/2023   BASOSABS 0.0 10/25/2023     Last metabolic panel Lab Results  Component Value Date   NA 136 10/23/2023   K 3.4 (L) 10/23/2023   CL 105 10/23/2023   CO2 23 10/23/2023   BUN 6 10/23/2023   CREATININE 0.59 10/23/2023   GLUCOSE 101 (H) 10/23/2023   GFRNONAA >60 10/23/2023   GFRAA >60 02/02/2019   CALCIUM  8.0 (L) 10/23/2023   PHOS 2.6 06/14/2023   PROT 6.7 10/18/2023   ALBUMIN 2.6 (L) 10/18/2023   BILITOT 0.3 10/18/2023   ALKPHOS 80 10/18/2023   AST 27 10/18/2023   ALT 9 10/18/2023   ANIONGAP 8 10/23/2023     GFR: Estimated Creatinine Clearance: 72.7 mL/min (by C-G formula based on SCr of 0.59 mg/dL).  Recent Results (from the past 240 hours)  Blood Culture (routine x 2)     Status: None   Collection Time: 10/17/23 10:10 PM   Specimen: BLOOD RIGHT ARM  Result Value Ref Range Status   Specimen Description BLOOD RIGHT ARM  Final   Special Requests   Final    BOTTLES DRAWN AEROBIC AND ANAEROBIC Blood Culture adequate volume   Culture   Final    NO GROWTH 5 DAYS Performed at [facility], [address] [place], [place] [address]    Report Status 10/22/2023 FINAL  Final  Blood Culture (routine x 2)     Status: None   Collection Time: 10/17/23 10:35 PM   Specimen: BLOOD RIGHT ARM  Result Value Ref Range Status   Specimen Description BLOOD RIGHT ARM  Final   Special Requests   Final    BOTTLES DRAWN AEROBIC AND ANAEROBIC Blood Culture adequate volume   Culture   Final    NO GROWTH 5 DAYS Performed at [facility], [address] [place], [place] [address]    Report Status 10/23/2023 FINAL  Final  Urine Culture     Status: Abnormal   Collection Time: 10/18/23 11:10 AM   Specimen: Urine, Random  Result Value Ref Range Status   Specimen Description URINE, RANDOM  Final   Special Requests   Final    NONE Reflexed from [id number] Performed at [facility], [address], [place], [place] [address]    Culture (A)  Final    >=100,000 COLONIES/mL ENTEROCOCCUS FAECIUM 30,000 COLONIES/mL PSEUDOMONAS AERUGINOSA    Report Status 10/20/2023 FINAL  Final   Organism ID, Bacteria ENTEROCOCCUS FAECIUM (A)  Final   Organism ID, Bacteria PSEUDOMONAS AERUGINOSA (A)  Final      Susceptibility   Enterococcus faecium - MIC*    AMPICILLIN  <=2 SENSITIVE Sensitive     NITROFURANTOIN 256 RESISTANT Resistant     VANCOMYCIN  <=0.5 SENSITIVE Sensitive     * >=100,000 COLONIES/mL ENTEROCOCCUS FAECIUM   Pseudomonas aeruginosa - MIC*    CEFTAZIDIME 4 SENSITIVE Sensitive      CIPROFLOXACIN  <=0.25 SENSITIVE Sensitive     GENTAMICIN 4 SENSITIVE Sensitive     IMIPENEM 1 SENSITIVE Sensitive     PIP/TAZO <=4 SENSITIVE Sensitive ug/mL    CEFEPIME  2 SENSITIVE Sensitive     * 30,000 COLONIES/mL PSEUDOMONAS AERUGINOSA  MRSA Next Gen by PCR, Nasal     Status: None   Collection Time: 10/18/23  8:22 PM   Specimen: Nasal Mucosa; Nasal Swab  Result Value Ref Range Status   MRSA by PCR Next Gen NOT DETECTED NOT DETECTED Final    Comment: (NOTE) The GeneXpert MRSA Assay (FDA approved for NASAL specimens only), is one component of a comprehensive MRSA colonization surveillance program. It is not intended to diagnose MRSA infection nor to guide or monitor treatment for MRSA infections. Test performance is not FDA approved in patients less than 9 years old. Performed at [facility], [address], [place], [place] [address]   Culture, blood (Routine X 2) w Reflex to ID Panel     Status: None (Preliminary result)   Collection Time: 10/22/23  8:32 AM   Specimen: BLOOD LEFT ARM  Result Value Ref Range Status   Specimen Description BLOOD LEFT ARM  Final   Special Requests   Final    BOTTLES DRAWN AEROBIC AND ANAEROBIC Blood Culture adequate volume   Culture   Final    NO GROWTH 3 DAYS Performed at [facility], [address], [place], [place] [address]    Report Status PENDING  Incomplete  Culture, blood (Routine X 2) w Reflex to ID Panel     Status: None (Preliminary result)   Collection Time: 10/22/23  8:32 AM   Specimen: BLOOD LEFT ARM  Result Value Ref Range Status   Specimen Description BLOOD LEFT ARM  Final   Special Requests   Final    BOTTLES DRAWN AEROBIC AND ANAEROBIC Blood Culture adequate volume   Culture   Final    NO GROWTH 3 DAYS Performed at [facility], [address] [place], [place] [address]    Report Status PENDING  Incomplete      Radiology Studies: DG Abd 2 Views Result Date: 10/24/2023 CLINICAL DATA:  [id number] Fecal  impaction in rectum [facility]) [id number] EXAM: ABDOMEN - 2 VIEW COMPARISON:  10/23/2023, 10/22/2023 FINDINGS: Nonobstructive bowel gas pattern. Diffuse gaseous distension throughout the colon with several scattered small air-fluid levels present. Decreased amount of the formed fecal material in the colon. The moderate amount of stool in the rectum is no longer present. No pneumoperitoneum. No organomegaly or radiopaque calculi. The lung bases are clear. IMPRESSION: Decreased volume of formed fecal material in the colon and moderate amount of stool in the rectum is no longer present. Electronically Signed   By: Rogelia Myers M.D.   On: 10/24/2023 13:18   DG Abd 1 View Result Date: 10/23/2023 CLINICAL DATA:  Possible  fecal impaction. EXAM: ABDOMEN - 1 VIEW COMPARISON:  10/22/2023 FINDINGS: Exam demonstrates continued evidence moderate fecal retention throughout the colon most notable over the rectum without significant change. No free peritoneal air. Remainder the exam is unchanged. IMPRESSION: Continued evidence of moderate fecal retention throughout the colon most notable over the rectum without significant change. Electronically Signed   By: Toribio Agreste M.D.   On: 10/23/2023 13:44      LOS: 7 days   CRITICAL CARE Performed by: Elgin Lam   Total critical care time: 40 minutes  Critical care time was exclusive of separately billable procedures and treating other patients.  Critical care was necessary to treat or prevent imminent or life-threatening deterioration.  Critical care was time spent personally by me on the following activities: development of treatment plan with patient and/or surrogate as well as nursing, discussions with consultants, evaluation of patient's response to treatment, examination of patient, obtaining history from patient or surrogate, ordering and performing treatments and interventions, ordering and review of laboratory studies, ordering and review of radiographic studies,  pulse oximetry and re-evaluation of patient's condition.   Elgin Lam, MD [facility] 10/25/2023, 10:06 AM   If 7PM-7AM, please contact night-coverage [email or web address]

## 2023-10-26 DIAGNOSIS — K5289 Other specified noninfective gastroenteritis and colitis: Secondary | ICD-10-CM | POA: Diagnosis not present

## 2023-10-26 DIAGNOSIS — A419 Sepsis, unspecified organism: Secondary | ICD-10-CM | POA: Diagnosis not present

## 2023-10-26 DIAGNOSIS — R6520 Severe sepsis without septic shock: Secondary | ICD-10-CM | POA: Diagnosis not present

## 2023-10-26 LAB — CBC WITH DIFFERENTIAL/PLATELET
Abs Immature Granulocytes: 0.01 K/uL (ref 0.00–0.07)
Basophils Absolute: 0 K/uL (ref 0.0–0.1)
Basophils Relative: 0 %
Eosinophils Absolute: 0.2 K/uL (ref 0.0–0.5)
Eosinophils Relative: 4 %
HCT: 27.8 % — ABNORMAL LOW (ref 36.0–46.0)
Hemoglobin: 7.6 g/dL — ABNORMAL LOW (ref 12.0–15.0)
Immature Granulocytes: 0 %
Lymphocytes Relative: 49 %
Lymphs Abs: 2.3 K/uL (ref 0.7–4.0)
MCH: 22.6 pg — ABNORMAL LOW (ref 26.0–34.0)
MCHC: 27.3 g/dL — ABNORMAL LOW (ref 30.0–36.0)
MCV: 82.5 fL (ref 80.0–100.0)
Monocytes Absolute: 0.4 K/uL (ref 0.1–1.0)
Monocytes Relative: 8 %
Neutro Abs: 1.9 K/uL (ref 1.7–7.7)
Neutrophils Relative %: 39 %
Platelets: 181 K/uL (ref 150–400)
RBC: 3.37 MIL/uL — ABNORMAL LOW (ref 3.87–5.11)
RDW: 19.7 % — ABNORMAL HIGH (ref 11.5–15.5)
WBC: 4.8 K/uL (ref 4.0–10.5)
nRBC: 0 % (ref 0.0–0.2)

## 2023-10-26 LAB — BASIC METABOLIC PANEL WITH GFR
Anion gap: 7 (ref 5–15)
BUN: <5 mg/dL — ABNORMAL LOW (ref 6–20)
CO2: 27 mmol/L (ref 22–32)
Calcium: 8.1 mg/dL — ABNORMAL LOW (ref 8.9–10.3)
Chloride: 104 mmol/L (ref 98–111)
Creatinine, Ser: 0.47 mg/dL (ref 0.44–1.00)
GFR, Estimated: >60 mL/min (ref >60)
Glucose, Bld: 87 mg/dL (ref 70–99)
Potassium: 4.1 mmol/L (ref 3.5–5.1)
Sodium: 138 mmol/L (ref 135–145)

## 2023-10-26 LAB — MAGNESIUM: Magnesium: 1.5 mg/dL — ABNORMAL LOW (ref 1.7–2.4)

## 2023-10-26 LAB — ALBUMIN: Albumin: 1.9 g/dL — ABNORMAL LOW (ref 3.5–5.0)

## 2023-10-26 MED ORDER — BACLOFEN 10 MG PO TABS
20.0000 mg | ORAL_TABLET | Freq: Four times a day (QID) | ORAL | Status: DC
  Administered 2023-10-26 – 2023-10-27 (×5): 20 mg via ORAL
  Filled 2023-10-26 (×5): qty 2

## 2023-10-26 MED ORDER — MAGNESIUM SULFATE 4 GM/100ML IV SOLN
4.0000 g | Freq: Once | INTRAVENOUS | Status: AC
  Administered 2023-10-26: 4 g via INTRAVENOUS
  Filled 2023-10-26: qty 100

## 2023-10-26 NOTE — Plan of Care (Signed)
  Problem: Education: Goal: Knowledge of General Education information will improve Description: Including pain rating scale, medication(s)/side effects and non-pharmacologic comfort measures Outcome: Progressing   Problem: Clinical Measurements: Goal: Ability to maintain clinical measurements within normal limits will improve Outcome: Progressing Goal: Will remain free from infection Outcome: Progressing Goal: Cardiovascular complication will be avoided Outcome: Progressing

## 2023-10-26 NOTE — Progress Notes (Signed)
 PROGRESS NOTE    [name]  FMW:[id number] DOB: [birth year] DOA: 10/17/2023 PCP: [facility]   Brief Narrative: [name] is a 53 y.o. female with a history of multiple sclerosis, cirrhosis.  Patient presented secondary to fever and was found to have evidence of sterile coral colitis in addition to right hydroureteronephrosis and acute urinary retentio; urine studies suggestive of a urinary tract infection.  Patient started on antibiotics but developed severe sepsis physiology and repeat imaging concerning for possible osteomyelitis in addition to persistent stercoral colitis.   Assessment and Plan:  Stercoral colitis Present on admission and presumed secondary to severe constipation. Diagnosed via CT imaging. Patient started on empiric antibiotics. General surgery consulted and evaluated, in setting of sepsis, with no evidence of peritonitis and no need for surgical management. GI consulted and started SMOG enema regimen with great success, however patient with persistent large volume of stool seen on imaging. SMOG enema repeated. Patient's x-ray improved with continued bowel regimen. GI signed off. -Continue Ceftriaxone  and Flagyl  with plan for 7-day course; switch to Augmentin  on discharge -MiraLAX  on discharge  Severe sepsis Not present on admission. Possibly secondary to stercoral colitis, although there is possible ischial osteomyelitis noted on CT imaging. Hypotension and lactic acidosis present. Repeat blood cultures obtained (8/13) and antibiotics broadened to Vancomycin  and Zosyn  IV. Abdomen not consistent with peritonitis. Hypotension responding to IV fluid boluses. Vancomycin  and Zosyn  transitioned back to Unasyn  per ID and then to Ceftriaxone  and Flagyl . Blood cultures with no growth to date. -Trend BPs -Discontinue midodrine   Ischial osteomyelitis Noted on CT imaging (8/13). On empiric antibiotics for sepsis. Patient with a known and chronic (over 2 years)  buttock wound and is followed by wound care as an outpatient. Patient started empirically on vancomycin  and Zosyn  for sepsis physiology. ID consulted and will unlikely commit to treating for several weeks as acute infection -ID recommendations: Ceftriaxone /Flagyl  with recommendation for 2 week course; transition to Augmentin  on discharge  IV site rash Possibly medication related, but unclear. No symptoms. Resolved.  Constipation Complicated by bedbound status from MS and paraplegia. Persistent constipation noted on CT imaging. Per nursing, patient has had daily bowel movements. Patient with significant bowel movements after SMOG enemas and MiraLAX .  Right hydronephrosis, resolved Noted on CT imaging. No associated AKI. Foley catheter placed. Urology consulted with recommendation for follow-up renal ultrasound, which was performed on 8/11 and negative for hydronephrosis.  UTI, resolved Urine culture significant for Enterococcus Faecium and Pseudomonas Aeruginosa. Patient completed treatment with Unasyn  and Ciprofloxacin .  Cirrhosis Portal hypertension Noted. Patient will need outpatient GI follow-up.  Hypokalemia -Potassium supplementation as needed  Pill dysphagia Noted. Chronic issue.  Microcytic anemia Baseline hemoglobin of around 10-11. Hemoglobin of 9.1 on admission and has drifted down slightly. Patient's anemia panel suggests iron  deficiency. IV iron  ordered while inpatient. Acute drop in hemoglobin to 7 of unclear etiology. Possible GI source however no overt bleeding noted. Hemoglobin remains stable.  History of multiple sclerosis Paraplegia Patient is bedbound.  Pressure injury Right/upper buttocks. Present on admission.   DVT prophylaxis: SCDs Code Status:   Code Status: Full Code Family Communication: None at bedside Disposition Plan: Discharge home in 1 day pending improved blood pressure and transition to outpatient antibiotics   Consultants:  Urology General  surgery Gastroenterology Infectious disease  Procedures:  None  Antimicrobials: Ceftriaxone  Flagyl  Unasyn  Ciprofloxacin  Vancomycin  Zosyn     Subjective: Hands feel cold. No other concerns.  Objective: BP 113/60 (BP Location: Right Arm)  Pulse 79   Temp 98.7 F (37.1 C) (Oral)   Resp 16   Ht 5' 2 (1.575 m)   Wt 67.1 kg   SpO2 97%   BMI 27.06 kg/m   Examination:  General exam: Appears calm and comfortable Respiratory system: Clear to auscultation. Respiratory effort normal. Cardiovascular system: S1 & S2 heard, RRR. Gastrointestinal system: Abdomen is nondistended, soft and nontender. Normal bowel sounds heard. Central nervous system: Alert and oriented. No focal neurological deficits. Psychiatry: Judgement and insight appear normal. Mood & affect appropriate.    Data Reviewed: I have personally reviewed following labs and imaging studies  CBC Lab Results  Component Value Date   WBC 4.8 10/26/2023   RBC 3.37 (L) 10/26/2023   HGB 7.6 (L) 10/26/2023   HCT 27.8 (L) 10/26/2023   MCV 82.5 10/26/2023   MCH 22.6 (L) 10/26/2023   PLT 181 10/26/2023   MCHC 27.3 (L) 10/26/2023   RDW 19.7 (H) 10/26/2023   LYMPHSABS 2.3 10/26/2023   MONOABS 0.4 10/26/2023   EOSABS 0.2 10/26/2023   BASOSABS 0.0 10/26/2023     Last metabolic panel Lab Results  Component Value Date   NA 138 10/26/2023   K 4.1 10/26/2023   CL 104 10/26/2023   CO2 27 10/26/2023   BUN <5 (L) 10/26/2023   CREATININE 0.47 10/26/2023   GLUCOSE 87 10/26/2023   GFRNONAA >60 10/26/2023   GFRAA >60 02/02/2019   CALCIUM  8.1 (L) 10/26/2023   PHOS 2.6 06/14/2023   PROT 6.7 10/18/2023   ALBUMIN 1.9 (L) 10/26/2023   BILITOT 0.3 10/18/2023   ALKPHOS 80 10/18/2023   AST 27 10/18/2023   ALT 9 10/18/2023   ANIONGAP 7 10/26/2023    GFR: Estimated Creatinine Clearance: 72.2 mL/min (by C-G formula based on SCr of 0.47 mg/dL).  Recent Results (from the past 240 hours)  Blood Culture (routine x 2)      Status: None   Collection Time: 10/17/23 10:10 PM   Specimen: BLOOD RIGHT ARM  Result Value Ref Range Status   Specimen Description BLOOD RIGHT ARM  Final   Special Requests   Final    BOTTLES DRAWN AEROBIC AND ANAEROBIC Blood Culture adequate volume   Culture   Final    NO GROWTH 5 DAYS Performed at [facility], [address] [place], [place] [address]    Report Status 10/22/2023 FINAL  Final  Blood Culture (routine x 2)     Status: None   Collection Time: 10/17/23 10:35 PM   Specimen: BLOOD RIGHT ARM  Result Value Ref Range Status   Specimen Description BLOOD RIGHT ARM  Final   Special Requests   Final    BOTTLES DRAWN AEROBIC AND ANAEROBIC Blood Culture adequate volume   Culture   Final    NO GROWTH 5 DAYS Performed at [facility], [address], [place], [place] [address]    Report Status 10/23/2023 FINAL  Final  Urine Culture     Status: Abnormal   Collection Time: 10/18/23 11:10 AM   Specimen: Urine, Random  Result Value Ref Range Status   Specimen Description URINE, RANDOM  Final   Special Requests   Final    NONE Reflexed from [id number] Performed at [facility], [address], [place], [place] [address]    Culture (A)  Final    >=100,000 COLONIES/mL ENTEROCOCCUS FAECIUM 30,000 COLONIES/mL PSEUDOMONAS AERUGINOSA    Report Status 10/20/2023 FINAL  Final   Organism ID, Bacteria  ENTEROCOCCUS FAECIUM (A)  Final   Organism ID, Bacteria PSEUDOMONAS AERUGINOSA (A)  Final      Susceptibility   Enterococcus faecium - MIC*    AMPICILLIN  <=2 SENSITIVE Sensitive     NITROFURANTOIN 256 RESISTANT Resistant     VANCOMYCIN  <=0.5 SENSITIVE Sensitive     * >=100,000 COLONIES/mL ENTEROCOCCUS FAECIUM   Pseudomonas aeruginosa - MIC*    CEFTAZIDIME 4 SENSITIVE Sensitive     CIPROFLOXACIN  <=0.25 SENSITIVE Sensitive     GENTAMICIN 4 SENSITIVE Sensitive     IMIPENEM 1 SENSITIVE Sensitive     PIP/TAZO <=4 SENSITIVE Sensitive ug/mL    CEFEPIME  2 SENSITIVE  Sensitive     * 30,000 COLONIES/mL PSEUDOMONAS AERUGINOSA  MRSA Next Gen by PCR, Nasal     Status: None   Collection Time: 10/18/23  8:22 PM   Specimen: Nasal Mucosa; Nasal Swab  Result Value Ref Range Status   MRSA by PCR Next Gen NOT DETECTED NOT DETECTED Final    Comment: (NOTE) The GeneXpert MRSA Assay (FDA approved for NASAL specimens only), is one component of a comprehensive MRSA colonization surveillance program. It is not intended to diagnose MRSA infection nor to guide or monitor treatment for MRSA infections. Test performance is not FDA approved in patients less than 72 years old. Performed at [facility], [address], [place], [place] [address]   Culture, blood (Routine X 2) w Reflex to ID Panel     Status: None (Preliminary result)   Collection Time: 10/22/23  8:32 AM   Specimen: BLOOD LEFT ARM  Result Value Ref Range Status   Specimen Description BLOOD LEFT ARM  Final   Special Requests   Final    BOTTLES DRAWN AEROBIC AND ANAEROBIC Blood Culture adequate volume   Culture   Final    NO GROWTH 4 DAYS Performed at [facility], [address], [place], [place] [address]    Report Status PENDING  Incomplete  Culture, blood (Routine X 2) w Reflex to ID Panel     Status: None (Preliminary result)   Collection Time: 10/22/23  8:32 AM   Specimen: BLOOD LEFT ARM  Result Value Ref Range Status   Specimen Description BLOOD LEFT ARM  Final   Special Requests   Final    BOTTLES DRAWN AEROBIC AND ANAEROBIC Blood Culture adequate volume   Culture   Final    NO GROWTH 4 DAYS Performed at [facility], [address], [place], [place] [address]    Report Status PENDING  Incomplete      Radiology Studies: No results found.     LOS: 8 days   CRITICAL CARE Performed by: Elgin Lam   Total critical care time: 40 minutes  Critical care time was exclusive of separately billable procedures and treating other patients.  Critical care was  necessary to treat or prevent imminent or life-threatening deterioration.  Critical care was time spent personally by me on the following activities: development of treatment plan with patient and/or surrogate as well as nursing, discussions with consultants, evaluation of patient's response to treatment, examination of patient, obtaining history from patient or surrogate, ordering and performing treatments and interventions, ordering and review of laboratory studies, ordering and review of radiographic studies, pulse oximetry and re-evaluation of patient's condition.   Elgin Lam, MD [facility] 10/26/2023, 12:06 PM   If 7PM-7AM, please contact night-coverage [email or web address]

## 2023-10-26 NOTE — Plan of Care (Signed)
  Problem: Education: Goal: Knowledge of General Education information will improve Description: Including pain rating scale, medication(s)/side effects and non-pharmacologic comfort measures Outcome: Progressing   Problem: Health Behavior/Discharge Planning: Goal: Ability to manage health-related needs will improve Outcome: Progressing   Problem: Pain Managment: Goal: General experience of comfort will improve and/or be controlled Outcome: Progressing

## 2023-10-27 ENCOUNTER — Other Ambulatory Visit (HOSPITAL_COMMUNITY): Payer: Self-pay

## 2023-10-27 DIAGNOSIS — K5289 Other specified noninfective gastroenteritis and colitis: Secondary | ICD-10-CM | POA: Diagnosis not present

## 2023-10-27 LAB — CULTURE, BLOOD (ROUTINE X 2)
Culture: NO GROWTH
Culture: NO GROWTH
Special Requests: ADEQUATE
Special Requests: ADEQUATE

## 2023-10-27 LAB — POTASSIUM: Potassium: 4.2 mmol/L (ref 3.5–5.1)

## 2023-10-27 LAB — MAGNESIUM: Magnesium: 2.1 mg/dL (ref 1.7–2.4)

## 2023-10-27 MED ORDER — AMOXICILLIN-POT CLAVULANATE 250-62.5 MG/5ML PO SUSR
500.0000 mg | Freq: Three times a day (TID) | ORAL | 0 refills | Status: AC
  Filled 2023-10-27: qty 200, 5d supply, fill #0

## 2023-10-27 MED ORDER — POLYETHYLENE GLYCOL 3350 17 GM/SCOOP PO POWD
17.0000 g | Freq: Every day | ORAL | 0 refills | Status: AC
  Filled 2023-10-27: qty 476, 28d supply, fill #0

## 2023-10-27 MED ORDER — POLYETHYLENE GLYCOL 3350 17 G PO PACK
17.0000 g | PACK | Freq: Every day | ORAL | Status: DC
  Administered 2023-10-27: 17 g via ORAL
  Filled 2023-10-27: qty 1

## 2023-10-27 NOTE — Discharge Summary (Signed)
 Physician Discharge Summary   Patient: [name] MRN: [id number] DOB: 1970-08-14  Admit date:     10/17/2023  Discharge date: 10/27/23  Discharge Physician: Elgin Lam, MD   PCP: [facility]   Recommendations at discharge:  PCP visit for hospital follow-up Repeat BMP/CMP and CBC as an outpatient  Discharge Diagnoses: Principal Problem:   Stercoral colitis Active Problems:   Constipation   Decubitus skin ulcer  Resolved Problems:   * No resolved hospital problems. *  Hospital Course: [name] is a 53 y.o. female with a history of multiple sclerosis, cirrhosis.  Patient presented secondary to fever and was found to have evidence of sterile coral colitis in addition to right hydroureteronephrosis and acute urinary retentio; urine studies suggestive of a urinary tract infection.  Patient started on antibiotics but developed severe sepsis physiology and repeat imaging concerning for possible osteomyelitis in addition to persistent stercoral colitis. Patient underwent aggressive bowel regimen with improvement of constipation. 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  Assessment and Plan:  Stercoral colitis Present on admission and presumed secondary to severe constipation. Diagnosed via CT imaging. Patient started on empiric antibiotics. General surgery consulted and evaluated, in setting of sepsis, with no evidence of peritonitis and no need for surgical management. GI consulted and started SMOG enema regimen with great success, however patient with persistent large volume of stool seen on imaging. SMOG enema repeated. Patient's x-ray improved with continued bowel regimen. GI signed off. Continue MiraLAX  and Senokot-S on discharge.   Severe sepsis Not present on admission. Possibly secondary to stercoral colitis, although there is possible ischial osteomyelitis noted on CT imaging. Hypotension and lactic acidosis present. Repeat blood cultures  obtained (8/13) and antibiotics broadened to Vancomycin  and Zosyn  IV. Abdomen not consistent with peritonitis. Hypotension responding to IV fluid boluses. Vancomycin  and Zosyn  transitioned back to Unasyn  per ID and then to Ceftriaxone  and Flagyl . Blood cultures with no growth to date.   Ischial osteomyelitis Noted on CT imaging (8/13). On empiric antibiotics for sepsis. Patient with a known and chronic (over 2 years) buttock wound and is followed by wound care as an outpatient. Patient started empirically on vancomycin  and Zosyn  for sepsis physiology. ID consulted and transitioned patient to Ceftriaxone  and Flagyl  and final recommendations to complete 2 weeks of antibiotics. Patient discharged on Augmentin .   IV site rash Possibly medication related, but unclear. No symptoms. Resolved.   Constipation Complicated by bedbound status from MS and paraplegia. Persistent constipation noted on CT imaging. Per nursing, patient has had daily bowel movements. Patient with significant bowel movements after SMOG enemas and MiraLAX .   Right hydronephrosis, resolved Noted on CT imaging. No associated AKI. Foley catheter placed. Urology consulted with recommendation for follow-up renal ultrasound, which was performed on 8/11 and negative for hydronephrosis.   UTI, resolved Urine culture significant for Enterococcus Faecium and Pseudomonas Aeruginosa. Patient completed treatment with Unasyn  and Ciprofloxacin .   Cirrhosis Portal hypertension Noted. Patient will need outpatient GI follow-up.   Hypokalemia Resolved with potassium supplementation.   Pill dysphagia Noted. Chronic issue.   Microcytic anemia Baseline hemoglobin of around 10-11. Hemoglobin of 9.1 on admission and has drifted down slightly. Patient's anemia panel suggests iron  deficiency. IV iron  ordered while inpatient. Acute drop in hemoglobin to 7 of unclear etiology. Possible GI source however no overt bleeding noted. Hemoglobin remains  stable.   History of multiple sclerosis Paraplegia Patient is bedbound.   Pressure injury Right/upper buttocks. Present on admission.   Consultants:  Urology General surgery Gastroenterology Infectious disease   Procedures:  None  Disposition: Home Diet recommendation: Regular diet   DISCHARGE MEDICATION: Allergies as of 10/27/2023       Reactions   Tomato Rash, Other (See Comments)   Tongue turns white and cannot taste for at least 2 days.    Tizanidine Diarrhea        Medication List     TAKE these medications    acetaminophen  500 MG tablet Commonly known as: TYLENOL  Take 1,000 mg by mouth in the morning, at noon, and at bedtime.   amoxicillin -clavulanate 250-62.5 MG/5ML suspension Commonly known as: Augmentin  Take 10 mLs (500 mg total) by mouth 3 (three) times daily for 5 days. DISCARD REMAINING.   ASHWAGANDHA GUMMIES PO Take 1 each by mouth at bedtime.   baclofen  20 MG tablet Commonly known as: LIORESAL  Take 20 mg by mouth 4 (four) times daily.   gabapentin  400 MG capsule Commonly known as: NEURONTIN  Take 400 mg by mouth 4 (four) times daily. Take one capsule (400mg ) by mouth four times each day and one additional capsule (400mg ) as needed.   liver oil-zinc  oxide 40 % ointment Commonly known as: DESITIN Apply 1 Application topically as needed for irritation.   meloxicam  15 MG tablet Commonly known as: MOBIC  Take 15 mg by mouth daily.   mineral oil-hydrophilic petrolatum ointment Apply 1 Application topically as needed for irritation or dry skin.   nitrofurantoin 50 MG capsule Commonly known as: MACRODANTIN Take 50 mg by mouth daily.   omeprazole 40 MG capsule Commonly known as: PRILOSEC Take 40 mg by mouth daily as needed (acid refluc, indigestion).   polyethylene glycol powder 17 GM/SCOOP powder Commonly known as: GLYCOLAX /MIRALAX  Dissolve 1 capful (17 g) in liquid as directed and take by mouth daily.   senna-docusate 8.6-50 MG  tablet Commonly known as: Senokot-S Take 2 tablets by mouth at bedtime.   SUPER-C 1000 PO Take 3 each by mouth every morning. VITAFUSION SUPER C Gummies   traMADol  50 MG tablet Commonly known as: ULTRAM  Take 1 tablet (50 mg total) by mouth every 12 (twelve) hours as needed for moderate pain.   [facility] Hydrophilic Wound Dressi Pste Apply 1 Application topically as needed (Raw, burning, irritated skin).   Vitafusion Multi Womens Chew Chew 2 each by mouth in the morning. VITAFUSION WOMEN'S MULTIVITAMIN GUMMIES        Discharge Exam: BP (!) 101/53 (BP Location: Right Arm)   Pulse 90   Temp 98.4 F (36.9 C) (Oral)   Resp 17   Ht 5' 2 (1.575 m)   Wt 64.7 kg   SpO2 96%   BMI 26.09 kg/m   General exam: Appears calm and comfortable Respiratory system: Clear to auscultation. Respiratory effort normal. Cardiovascular system: S1 & S2 heard, RRR. Gastrointestinal system: Abdomen is nondistended, soft and nontender. Normal bowel sounds heard. Central nervous system: Alert and oriented. No focal neurological deficits. Psychiatry: Judgement and insight appear normal. Mood & affect appropriate.   Condition at discharge: stable  The results of significant diagnostics from this hospitalization (including imaging, microbiology, ancillary and laboratory) are listed below for reference.   Imaging Studies: DG Abd 2 Views Result Date: 10/24/2023 CLINICAL DATA:  [id number] Fecal impaction in rectum [facility]) [id number] EXAM: ABDOMEN - 2 VIEW COMPARISON:  10/23/2023, 10/22/2023 FINDINGS: Nonobstructive bowel gas pattern. Diffuse gaseous distension throughout the colon with several scattered small air-fluid levels present. Decreased amount of the formed fecal material in the colon. The moderate amount of  stool in the rectum is no longer present. No pneumoperitoneum. No organomegaly or radiopaque calculi. The lung bases are clear. IMPRESSION: Decreased volume of formed fecal material in the colon and moderate  amount of stool in the rectum is no longer present. Electronically Signed   By: Rogelia Myers M.D.   On: 10/24/2023 13:18   DG Abd 1 View Result Date: 10/23/2023 CLINICAL DATA:  Possible fecal impaction. EXAM: ABDOMEN - 1 VIEW COMPARISON:  10/22/2023 FINDINGS: Exam demonstrates continued evidence moderate fecal retention throughout the colon most notable over the rectum without significant change. No free peritoneal air. Remainder the exam is unchanged. IMPRESSION: Continued evidence of moderate fecal retention throughout the colon most notable over the rectum without significant change. Electronically Signed   By: Toribio Agreste M.D.   On: 10/23/2023 13:44   CT ABDOMEN PELVIS WO CONTRAST Result Date: 10/22/2023 EXAM: CT ABDOMEN AND PELVIS WITHOUT CONTRAST 10/22/2023 05:14:02 AM TECHNIQUE: CT of the abdomen and pelvis was performed without the administration of intravenous contrast. Multiplanar reformatted images are provided for review. Automated exposure control, iterative reconstruction, and/or weight based adjustment of the mA/kV was utilized to reduce the radiation dose to as low as reasonably achievable. COMPARISON: 10/17/2023 CLINICAL HISTORY: Abdominal pain, acute, nonlocalized. cirrhosis on admission imaging p/w stercoral colitis c/b R hydroureteronephrosis and acute urinary retention. FINDINGS: LOWER CHEST: No acute abnormality. LIVER: The contour of the liver is irregular and slightly nodular. Hypertrophy of the lateral segment of the left lobe of the liver is noted. No focal liver lesion identified. GALLBLADDER AND BILE DUCTS: Gallbladder sludge versus noncalcified stones noted within the dependent portion of the gallbladder. No significant gallbladder wall thickening or pericholecystic inflammation. No signs of main duct dilatation, inflammation or mass. SPLEEN: No focal splenic abnormality. PANCREAS: No acute abnormality. ADRENAL GLANDS: The adrenal glands are normal. KIDNEYS, URETERS AND  BLADDER: Bilateral kidney stones. The right kidney stone measures 3 mm within the upper pole collecting system. Multiple left kidney stones are noted. The largest is in the inferior pole measuring 6 mm. Unchanged cyst within the anterior cortex of the interpolar right kidney measuring 2.4 cm. No follow up imaging recommended. Right-sided hydronephrosis has resolved in the interval. No left hydronephrosis. Urinary bladder appears normal. GI AND BOWEL: The stomach is non-distended. No pathologic dilatation of the large or small bowel loops. There are a few prominent small bowel loops with air-fluid levels noted in the right lower quadrant of the abdomen which measure up to 2.2 cm. Persistent moderate stool burden noted throughout the colon. The appendix is visualized and appears normal. A large volume of desiccated stool is identified within the rectum which is only mildly diminished when compared with the previous exam. Persistent circumferential wall thickening of the rectum with mild perirectal soft tissue haziness. PERITONEUM AND RETROPERITONEUM: No free fluid or fluid collections. No signs of pneumoperitoneum. VASCULATURE: Aortic atherosclerosis. Upper abdominal varicosities. LYMPH NODES: No abdominal or pelvic adenopathy. REPRODUCTIVE ORGANS: No acute abnormality. BONES AND SOFT TISSUES: Bilateral areas of ischial decubitus ulcerations are present, which extend up to the bone. This is similar to the previous exam. Asymmetric increased sclerosis involving the right ischial tuberosity is unchanged (image 114/3). Imaging findings most likely reflect sequelae of chronic osteomyelitis. There is loss of the cortex overlying the left ischial tuberosity, which is concerning for acute osteomyelitis (sagittal image 107/7). No associated fluid collections identified. IMPRESSION: 1. Persistent circumferential wall thickening of the rectum with mild perirectal soft tissue haziness, concerning for stercoral colitis. 2.  Large  volume of retained stool throughout the colon compatible with constipation. 3. Bilateral areas of ischial decubitus ulcerations, similar to the previous exam Loss of the cortex overlying the left ischial tuberosity, concerning for acute osteomyelitis. No associated fluid collections identified. 4. Bilateral kidney stones. Right-sided hydronephrosis has resolved in the interval. 5. Morphologic features of the liver compatible with cirrhosis. Stigmata of portal venous hypertension noted, including upper abdominal varicosities. Electronically signed by: Waddell Calk MD 10/22/2023 06:07 AM EDT RP Workstation: [id number]   DG Abd 1 View Result Date: 10/22/2023 CLINICAL DATA:  Abdominal pain EXAM: ABDOMEN - 1 VIEW COMPARISON:  12/22/2020 FINDINGS: Scattered large and small bowel gas is noted. Fecal material is noted throughout the colon consistent with colonic constipation. Considerable fecal material is noted within the rectum consistent with impaction. No free air is seen. No bony abnormality is noted. IMPRESSION: Changes consistent with colonic constipation and rectal impaction. Electronically Signed   By: Oneil Devonshire M.D.   On: 10/22/2023 03:08   US  RENAL Result Date: 10/20/2023 CLINICAL DATA:  Hydronephrosis EXAM: RENAL / URINARY TRACT ULTRASOUND COMPLETE COMPARISON:  None Available. FINDINGS: Right Kidney: Renal measurements: 9.3 x 4.0 x 4.8 cm = volume: 94 mL. Mild to moderate renal cortical atrophy. Echogenicity within normal limits. No mass or hydronephrosis visualized. 3 cm simple exophytic cortical cyst arising from lower pole the right kidney which no follow-up imaging is recommended. Left Kidney: Renal measurements: 8.9 x 4.8 x 4.8 cm = volume: 109 mL. Mild-to-moderate renal cortical atrophy. Echogenicity within normal limits. No mass or hydronephrosis visualized. Bladder: Appears normal for degree of bladder distention. Other: None. IMPRESSION: 1. Mild-to-moderate bilateral renal cortical atrophy. No  hydronephrosis. Electronically Signed   By: Dorethia Molt M.D.   On: 10/20/2023 20:45   CT CHEST ABDOMEN PELVIS W CONTRAST Result Date: 10/18/2023 CLINICAL DATA:  Sepsis and abdominal pain.  Sacral wound EXAM: CT CHEST, ABDOMEN, AND PELVIS WITH CONTRAST TECHNIQUE: Multidetector CT imaging of the chest, abdomen and pelvis was performed following the standard protocol during bolus administration of intravenous contrast. RADIATION DOSE REDUCTION: This exam was performed according to the departmental dose-optimization program which includes automated exposure control, adjustment of the mA and/or kV according to patient size and/or use of iterative reconstruction technique. CONTRAST:  75mL OMNIPAQUE  IOHEXOL  350 MG/ML SOLN COMPARISON:  CT abdomen and pelvis 02/19/2021. FINDINGS: CT CHEST FINDINGS Cardiovascular: No significant vascular findings. Normal heart size. No pericardial effusion. Mediastinum/Nodes: No enlarged mediastinal, hilar, or axillary lymph nodes. There is a 1 cm hypodense left thyroid  nodule. There are no enlarged lymph nodes. Esophagus is within normal limits. Lungs/Pleura: Lungs are clear. No pleural effusion or pneumothorax. Musculoskeletal: No chest wall mass or suspicious bone lesions identified. There is mild elevation of the left hemidiaphragm, unchanged. CT ABDOMEN PELVIS FINDINGS Hepatobiliary: There is nodular liver contour. No focal liver lesion identified. Gallbladder and bile ducts are within normal limits. Pancreas: Unremarkable. No pancreatic ductal dilatation or surrounding inflammatory changes. Spleen: Spleen is mild-to-moderately enlarged, unchanged. Adrenals/Urinary Tract: Bilateral renal calculi are present, left greater than right, measuring up to 4 mm. There is mild right-sided hydroureteronephrosis to the level of the distal ureter, likely secondary to compression from the rectum. No obstructing calculus identified. There is no left-sided hydronephrosis. There is no perinephric  fluid or stranding. There is a 2.8 cm right renal cyst. The adrenal glands are within normal limits. The bladder is distended, but otherwise within normal limits. Stomach/Bowel: The rectum is markedly dilated and filled with stool  measuring up to 14 cm in diameter. There is diffuse rectal wall thickening with mild surrounding inflammation. No other dilated bowel loops are identified. There is no bowel obstruction. Appendix appears within normal limits. Overall stool burden is large. Vascular/Lymphatic: Aorta and IVC are normal in size. There are atherosclerotic calcifications throughout the aorta. [place] off a GIA varices are present. No enlarged lymph nodes are seen. Reproductive: Status post hysterectomy. No adnexal masses. Other: There is no ascites or focal abdominal wall hernia. Musculoskeletal: Bilateral ischial decubitus ulcerations are present to the level of the bone. This is not significantly changed. No acute fracture identified. Degenerative changes affect the spine. No focal abscess identified. No soft tissue gas. IMPRESSION: 1. Markedly dilated rectum filled with stool with diffuse rectal wall thickening and surrounding inflammation. Findings are compatible with stercoral colitis. 2. Mild right-sided hydroureteronephrosis to the level of the distal ureter, likely secondary to compression from the rectum. 3. Bilateral nonobstructing renal calculi. 4. Findings compatible with cirrhosis and portal hypertension. 5. Stable bilateral ischial decubitus ulcerations extending to the level of the ischia. 6. 1 cm incidental left thyroid  nodule. Not clinically significant; no follow-up imaging recommended (ref: J Am Coll Radiol. 2015 Feb;12(2): 143-50). 7. No acute cardiopulmonary process. Aortic Atherosclerosis (ICD10-I70.0). Electronically Signed   By: Greig Pique M.D.   On: 10/18/2023 00:02   DG Chest Port 1 View Result Date: 10/17/2023 CLINICAL DATA:  Questionable sepsis EXAM: PORTABLE CHEST 1 VIEW  COMPARISON:  Chest x-ray 06/13/2023 FINDINGS: There are 2 small adjacent nodular densities in the left perihilar region measuring up to 4 mm, indeterminate. The lungs are otherwise clear. There is no pleural effusion or pneumothorax. The cardiomediastinal silhouette is within normal limits. No acute fractures are seen. IMPRESSION: Two small adjacent nodular densities in the left perihilar region measuring up to 4 mm, indeterminate. Recommend further evaluation with chest CT. Electronically Signed   By: Greig Pique M.D.   On: 10/17/2023 22:22    Microbiology: Results for orders placed or performed during the hospital encounter of 10/17/23  Blood Culture (routine x 2)     Status: None   Collection Time: 10/17/23 10:10 PM   Specimen: BLOOD RIGHT ARM  Result Value Ref Range Status   Specimen Description BLOOD RIGHT ARM  Final   Special Requests   Final    BOTTLES DRAWN AEROBIC AND ANAEROBIC Blood Culture adequate volume   Culture   Final    NO GROWTH 5 DAYS Performed at [facility], [address], [place], [place] [address]    Report Status 10/22/2023 FINAL  Final  Blood Culture (routine x 2)     Status: None   Collection Time: 10/17/23 10:35 PM   Specimen: BLOOD RIGHT ARM  Result Value Ref Range Status   Specimen Description BLOOD RIGHT ARM  Final   Special Requests   Final    BOTTLES DRAWN AEROBIC AND ANAEROBIC Blood Culture adequate volume   Culture   Final    NO GROWTH 5 DAYS Performed at [facility], [address] [place], [place] [address]    Report Status 10/23/2023 FINAL  Final  Urine Culture     Status: Abnormal   Collection Time: 10/18/23 11:10 AM   Specimen: Urine, Random  Result Value Ref Range Status   Specimen Description URINE, RANDOM  Final   Special Requests   Final    NONE Reflexed from [id number] Performed at [facility], [address], [place], [place]  [address]    Culture (A)  Final    >=100,000 COLONIES/mL ENTEROCOCCUS FAECIUM 30,000  COLONIES/mL PSEUDOMONAS AERUGINOSA    Report Status 10/20/2023 FINAL  Final   Organism ID, Bacteria ENTEROCOCCUS FAECIUM (A)  Final   Organism ID, Bacteria PSEUDOMONAS AERUGINOSA (A)  Final      Susceptibility   Enterococcus faecium - MIC*    AMPICILLIN  <=2 SENSITIVE Sensitive     NITROFURANTOIN 256 RESISTANT Resistant     VANCOMYCIN  <=0.5 SENSITIVE Sensitive     * >=100,000 COLONIES/mL ENTEROCOCCUS FAECIUM   Pseudomonas aeruginosa - MIC*    CEFTAZIDIME 4 SENSITIVE Sensitive     CIPROFLOXACIN  <=0.25 SENSITIVE Sensitive     GENTAMICIN 4 SENSITIVE Sensitive     IMIPENEM 1 SENSITIVE Sensitive     PIP/TAZO <=4 SENSITIVE Sensitive ug/mL    CEFEPIME  2 SENSITIVE Sensitive     * 30,000 COLONIES/mL PSEUDOMONAS AERUGINOSA  MRSA Next Gen by PCR, Nasal     Status: None   Collection Time: 10/18/23  8:22 PM   Specimen: Nasal Mucosa; Nasal Swab  Result Value Ref Range Status   MRSA by PCR Next Gen NOT DETECTED NOT DETECTED Final    Comment: (NOTE) The GeneXpert MRSA Assay (FDA approved for NASAL specimens only), is one component of a comprehensive MRSA colonization surveillance program. It is not intended to diagnose MRSA infection nor to guide or monitor treatment for MRSA infections. Test performance is not FDA approved in patients less than 73 years old. Performed at [facility], [address], [place], [place] [address]   Culture, blood (Routine X 2) w Reflex to ID Panel     Status: None   Collection Time: 10/22/23  8:32 AM   Specimen: BLOOD LEFT ARM  Result Value Ref Range Status   Specimen Description BLOOD LEFT ARM  Final   Special Requests   Final    BOTTLES DRAWN AEROBIC AND ANAEROBIC Blood Culture adequate volume   Culture   Final    NO GROWTH 5 DAYS Performed at [facility], [address], [place], [place] [address]    Report Status 10/27/2023 FINAL  Final  Culture, blood (Routine X 2) w Reflex to ID Panel     Status: None   Collection Time: 10/22/23  8:32 AM    Specimen: BLOOD LEFT ARM  Result Value Ref Range Status   Specimen Description BLOOD LEFT ARM  Final   Special Requests   Final    BOTTLES DRAWN AEROBIC AND ANAEROBIC Blood Culture adequate volume   Culture   Final    NO GROWTH 5 DAYS Performed at [facility], [address], [place], [place] [address]    Report Status 10/27/2023 FINAL  Final    Labs: CBC: Recent Labs  Lab 10/22/23 0238 10/23/23 0332 10/24/23 0430 10/24/23 0843 10/25/23 0533 10/26/23 0500  WBC 4.9 6.6 5.5  --  5.8 4.8  NEUTROABS 4.0 3.6 2.8  --  2.7 1.9  HGB 8.6* 8.5* 7.0* 7.5* 7.4* 7.6*  HCT 30.7* 30.8* 24.8* 27.1* 26.7* 27.8*  MCV 79.1* 80.6 81.0  --  82.4 82.5  PLT 146* 167 154  --  181 181   Basic Metabolic Panel: Recent Labs  Lab 10/21/23 0348 10/23/23 0332 10/25/23 0741 10/26/23 0500 10/27/23 0300  NA 139 136 138 138  --   K 3.7 3.4* 3.1* 4.1 4.2  CL 107 105 107 104  --   CO2 23 23 26 27   --  GLUCOSE 98 101* 109* 87  --   BUN 11 6 5* <5*  --   CREATININE 0.67 0.59 0.52 0.47  --   CALCIUM  8.2* 8.0* 7.5* 8.1*  --   MG  --   --   --  1.5* 2.1   Liver Function Tests: Recent Labs  Lab 10/26/23 0500  ALBUMIN 1.9*    Discharge time spent: 35 minutes.  Signed: Elgin Lam, MD [facility] 10/27/2023

## 2023-10-27 NOTE — Discharge Instructions (Addendum)
 [name],  You were in the hospital with bad constipation causing inflammation and infection in your colon. This was managed with antibiotics and a bowel regimen to get you a bowel movement; you can take MiraLAX  daily, but increase to twice daily if you are having decreased bowel movements. You also seem to have some infection of part of your pelvic bone related to your known buttock wound. You will be on antibiotics for this as well. Please follow-up with your PCP.

## 2023-10-27 NOTE — TOC Progression Note (Addendum)
 Transition of Care (TOC) - Progression Note   Patient for discharge today . Requesting PTAR home.   Active with [facility] for HHRN and HHPT. Secure chatted MD for orders and face to face for [facility] and HHPT   MD entered orders. Kelly with [facility] aware.   Confirmed with patient that she will need PTAR home and family is in the home waiting.   Per nurse patient will be ready 2 pm. PTAR called requested 2 pm or later pick up.   PTAR paperwork on chart  Patient Details  Name: [name] MRN: [id number] Date of Birth: March 29, 1970  Transition of Care [facility] CM/SW Contact  Bellamia Ferch, Powell Jansky, RN Phone Number: 10/27/2023, 12:47 PM  Clinical Narrative:                         Expected Discharge Plan and Services         Expected Discharge Date: 10/27/23                                     Social Drivers of Health (SDOH) Interventions SDOH Screenings   Food Insecurity: No Food Insecurity (10/19/2023)  Housing: Low Risk  (10/19/2023)  Transportation Needs: No Transportation Needs (10/19/2023)  Utilities: Not At Risk (10/19/2023)  Depression (PHQ2-9): Low Risk  (08/03/2021)  Tobacco Use: Medium Risk (10/22/2023)    Readmission Risk Interventions    02/22/2021   11:57 AM  Readmission Risk Prevention Plan  Post Dischage Appt Complete  Medication Screening Complete  Transportation Screening Complete

## 2023-11-18 ENCOUNTER — Encounter (HOSPITAL_BASED_OUTPATIENT_CLINIC_OR_DEPARTMENT_OTHER): Attending: Internal Medicine | Admitting: Internal Medicine

## 2023-11-18 DIAGNOSIS — L89324 Pressure ulcer of left buttock, stage 4: Secondary | ICD-10-CM | POA: Diagnosis not present

## 2023-11-18 DIAGNOSIS — L89314 Pressure ulcer of right buttock, stage 4: Secondary | ICD-10-CM | POA: Diagnosis not present

## 2023-11-18 DIAGNOSIS — G35 Multiple sclerosis: Secondary | ICD-10-CM | POA: Insufficient documentation

## 2023-11-18 DIAGNOSIS — M4628 Osteomyelitis of vertebra, sacral and sacrococcygeal region: Secondary | ICD-10-CM | POA: Diagnosis not present

## 2024-03-02 ENCOUNTER — Encounter (HOSPITAL_BASED_OUTPATIENT_CLINIC_OR_DEPARTMENT_OTHER): Admitting: Internal Medicine

## 2024-03-23 ENCOUNTER — Encounter (HOSPITAL_BASED_OUTPATIENT_CLINIC_OR_DEPARTMENT_OTHER): Attending: Internal Medicine | Admitting: Internal Medicine

## 2024-03-23 DIAGNOSIS — M4628 Osteomyelitis of vertebra, sacral and sacrococcygeal region: Secondary | ICD-10-CM | POA: Insufficient documentation

## 2024-03-23 DIAGNOSIS — L89314 Pressure ulcer of right buttock, stage 4: Secondary | ICD-10-CM | POA: Diagnosis not present

## 2024-03-23 DIAGNOSIS — G35D Multiple sclerosis, unspecified: Secondary | ICD-10-CM | POA: Insufficient documentation
